# Patient Record
Sex: Male | Born: 1950
Health system: Southern US, Community
[De-identification: ages and names within clinical notes are randomized; demographics above are authoritative.]

## PROBLEM LIST (undated history)

## (undated) DIAGNOSIS — I10 Essential (primary) hypertension: Secondary | ICD-10-CM

## (undated) DIAGNOSIS — K921 Melena: Secondary | ICD-10-CM

## (undated) DIAGNOSIS — M109 Gout, unspecified: Secondary | ICD-10-CM

## (undated) DIAGNOSIS — K219 Gastro-esophageal reflux disease without esophagitis: Secondary | ICD-10-CM

## (undated) DIAGNOSIS — J449 Chronic obstructive pulmonary disease, unspecified: Secondary | ICD-10-CM

## (undated) DIAGNOSIS — I509 Heart failure, unspecified: Secondary | ICD-10-CM

## (undated) DIAGNOSIS — E119 Type 2 diabetes mellitus without complications: Secondary | ICD-10-CM

## (undated) DIAGNOSIS — R609 Edema, unspecified: Secondary | ICD-10-CM

## (undated) DIAGNOSIS — J189 Pneumonia, unspecified organism: Secondary | ICD-10-CM

## (undated) HISTORY — DX: Gout, unspecified: M10.9

## (undated) HISTORY — DX: Gastro-esophageal reflux disease without esophagitis: K21.9

## (undated) HISTORY — DX: Pneumonia, unspecified organism: J18.9

## (undated) HISTORY — DX: Edema, unspecified: R60.9

## (undated) HISTORY — DX: Melena: K92.1

---

## 2006-04-28 ENCOUNTER — Ambulatory Visit: Payer: Self-pay | Admitting: Anesthesiology

## 2007-08-10 ENCOUNTER — Ambulatory Visit: Payer: Self-pay | Admitting: Family Medicine

## 2007-10-06 ENCOUNTER — Emergency Department: Payer: Self-pay | Admitting: Internal Medicine

## 2014-10-28 ENCOUNTER — Ambulatory Visit: Payer: Self-pay | Admitting: Family Medicine

## 2016-07-27 ENCOUNTER — Emergency Department: Payer: Managed Care, Other (non HMO)

## 2016-07-27 ENCOUNTER — Encounter: Payer: Self-pay | Admitting: *Deleted

## 2016-07-27 ENCOUNTER — Inpatient Hospital Stay
Admission: EM | Admit: 2016-07-27 | Discharge: 2016-07-29 | DRG: 194 | Disposition: A | Discharge status: Home / Self Care | Payer: Managed Care, Other (non HMO) | Attending: Internal Medicine | Admitting: Internal Medicine

## 2016-07-27 DIAGNOSIS — I110 Hypertensive heart disease with heart failure: Secondary | ICD-10-CM | POA: Diagnosis present

## 2016-07-27 DIAGNOSIS — G4700 Insomnia, unspecified: Secondary | ICD-10-CM | POA: Diagnosis present

## 2016-07-27 DIAGNOSIS — R0600 Dyspnea, unspecified: Secondary | ICD-10-CM | POA: Diagnosis present

## 2016-07-27 DIAGNOSIS — J440 Chronic obstructive pulmonary disease with acute lower respiratory infection: Secondary | ICD-10-CM | POA: Diagnosis present

## 2016-07-27 DIAGNOSIS — J189 Pneumonia, unspecified organism: Secondary | ICD-10-CM

## 2016-07-27 DIAGNOSIS — E119 Type 2 diabetes mellitus without complications: Secondary | ICD-10-CM | POA: Diagnosis present

## 2016-07-27 DIAGNOSIS — E871 Hypo-osmolality and hyponatremia: Secondary | ICD-10-CM | POA: Diagnosis present

## 2016-07-27 DIAGNOSIS — Z8249 Family history of ischemic heart disease and other diseases of the circulatory system: Secondary | ICD-10-CM

## 2016-07-27 DIAGNOSIS — Z823 Family history of stroke: Secondary | ICD-10-CM

## 2016-07-27 DIAGNOSIS — Z23 Encounter for immunization: Secondary | ICD-10-CM

## 2016-07-27 DIAGNOSIS — I5032 Chronic diastolic (congestive) heart failure: Secondary | ICD-10-CM | POA: Diagnosis present

## 2016-07-27 HISTORY — DX: Type 2 diabetes mellitus without complications: E11.9

## 2016-07-27 HISTORY — DX: Essential (primary) hypertension: I10

## 2016-07-27 HISTORY — DX: Chronic obstructive pulmonary disease, unspecified: J44.9

## 2016-07-27 HISTORY — DX: Pneumonia, unspecified organism: J18.9

## 2016-07-27 HISTORY — DX: Heart failure, unspecified: I50.9

## 2016-07-27 LAB — CBC WITH DIFFERENTIAL/PLATELET
Basophils Absolute: 0.1 10*3/uL (ref 0–0.1)
Basophils Relative: 1 %
EOS ABS: 0.3 10*3/uL (ref 0–0.7)
Eosinophils Relative: 3 %
HCT: 44.9 % (ref 40.0–52.0)
HEMOGLOBIN: 15.5 g/dL (ref 13.0–18.0)
LYMPHS ABS: 1.5 10*3/uL (ref 1.0–3.6)
Lymphocytes Relative: 17 %
MCH: 31.2 pg (ref 26.0–34.0)
MCHC: 34.5 g/dL (ref 32.0–36.0)
MCV: 90.4 fL (ref 80.0–100.0)
Monocytes Absolute: 1.3 10*3/uL — ABNORMAL HIGH (ref 0.2–1.0)
Monocytes Relative: 14 %
NEUTROS ABS: 6.1 10*3/uL (ref 1.4–6.5)
Neutrophils Relative %: 65 %
Platelets: 188 10*3/uL (ref 150–440)
RBC: 4.97 MIL/uL (ref 4.40–5.90)
RDW: 14.4 % (ref 11.5–14.5)
WBC: 9.3 10*3/uL (ref 3.8–10.6)

## 2016-07-27 LAB — CBC
HCT: 46 % (ref 40.0–52.0)
Hemoglobin: 15.7 g/dL (ref 13.0–18.0)
MCH: 30.9 pg (ref 26.0–34.0)
MCHC: 34.2 g/dL (ref 32.0–36.0)
MCV: 90.4 fL (ref 80.0–100.0)
PLATELETS: 217 10*3/uL (ref 150–440)
RBC: 5.09 MIL/uL (ref 4.40–5.90)
RDW: 14.3 % (ref 11.5–14.5)
WBC: 10.8 10*3/uL — ABNORMAL HIGH (ref 3.8–10.6)

## 2016-07-27 LAB — BASIC METABOLIC PANEL
Anion gap: 12 (ref 5–15)
BUN: 14 mg/dL (ref 6–20)
CHLORIDE: 94 mmol/L — AB (ref 101–111)
CO2: 27 mmol/L (ref 22–32)
Calcium: 9.1 mg/dL (ref 8.9–10.3)
Creatinine, Ser: 1.25 mg/dL — ABNORMAL HIGH (ref 0.61–1.24)
GFR calc non Af Amer: 60 mL/min — ABNORMAL LOW (ref >60)
Glucose, Bld: 395 mg/dL — ABNORMAL HIGH (ref 65–99)
POTASSIUM: 3.7 mmol/L (ref 3.5–5.1)
SODIUM: 133 mmol/L — AB (ref 135–145)

## 2016-07-27 LAB — GLUCOSE, CAPILLARY
GLUCOSE-CAPILLARY: 364 mg/dL — AB (ref 65–99)
Glucose-Capillary: 352 mg/dL — ABNORMAL HIGH (ref 65–99)

## 2016-07-27 LAB — CREATININE, SERUM
Creatinine, Ser: 1.28 mg/dL — ABNORMAL HIGH (ref 0.61–1.24)
GFR calc Af Amer: >60 mL/min (ref >60)
GFR calc non Af Amer: 58 mL/min — ABNORMAL LOW (ref >60)

## 2016-07-27 LAB — TROPONIN I

## 2016-07-27 LAB — MAGNESIUM: MAGNESIUM: 1.7 mg/dL (ref 1.7–2.4)

## 2016-07-27 LAB — STREP PNEUMONIAE URINARY ANTIGEN: Strep Pneumo Urinary Antigen: NEGATIVE

## 2016-07-27 LAB — BRAIN NATRIURETIC PEPTIDE: B NATRIURETIC PEPTIDE 5: 37 pg/mL (ref 0.0–100.0)

## 2016-07-27 MED ORDER — METHYLPREDNISOLONE SODIUM SUCC 125 MG IJ SOLR
125.0000 mg | Freq: Once | INTRAMUSCULAR | Status: AC
  Administered 2016-07-27: 125 mg via INTRAVENOUS
  Filled 2016-07-27: qty 2

## 2016-07-27 MED ORDER — ADULT MULTIVITAMIN W/MINERALS CH
1.0000 | ORAL_TABLET | Freq: Every day | ORAL | Status: DC
  Administered 2016-07-28 – 2016-07-29 (×2): 1 via ORAL
  Filled 2016-07-27 (×2): qty 1

## 2016-07-27 MED ORDER — AZITHROMYCIN 500 MG PO TABS: 500.0000 mg | ORAL_TABLET | Freq: Once | ORAL | Status: DC

## 2016-07-27 MED ORDER — LOSARTAN POTASSIUM 50 MG PO TABS
100.0000 mg | ORAL_TABLET | Freq: Every day | ORAL | Status: DC
  Administered 2016-07-28 – 2016-07-29 (×2): 100 mg via ORAL
  Filled 2016-07-27 (×2): qty 2

## 2016-07-27 MED ORDER — PREDNISONE 20 MG PO TABS: 60.0000 mg | ORAL_TABLET | Freq: Once | ORAL | Status: DC

## 2016-07-27 MED ORDER — VITAMIN D 1000 UNITS PO TABS
1000.0000 [IU] | ORAL_TABLET | Freq: Every day | ORAL | Status: DC
  Administered 2016-07-27 – 2016-07-29 (×3): 1000 [IU] via ORAL
  Filled 2016-07-27 (×5): qty 1

## 2016-07-27 MED ORDER — INSULIN ASPART 100 UNIT/ML ~~LOC~~ SOLN
0.0000 [IU] | Freq: Three times a day (TID) | SUBCUTANEOUS | Status: DC
  Administered 2016-07-27: 16:00:00 15 [IU] via SUBCUTANEOUS
  Administered 2016-07-28: 11:00:00 8 [IU] via SUBCUTANEOUS
  Administered 2016-07-28: 11 [IU] via SUBCUTANEOUS
  Filled 2016-07-27: qty 8
  Filled 2016-07-27: qty 11
  Filled 2016-07-27: qty 15

## 2016-07-27 MED ORDER — METOPROLOL SUCCINATE ER 50 MG PO TB24
25.0000 mg | ORAL_TABLET | Freq: Every day | ORAL | Status: DC
  Administered 2016-07-28 – 2016-07-29 (×2): 25 mg via ORAL
  Filled 2016-07-27 (×2): qty 1

## 2016-07-27 MED ORDER — TORSEMIDE 20 MG PO TABS
20.0000 mg | ORAL_TABLET | Freq: Every day | ORAL | Status: DC
  Administered 2016-07-28 – 2016-07-29 (×2): 20 mg via ORAL
  Filled 2016-07-27 (×2): qty 1

## 2016-07-27 MED ORDER — ACETAMINOPHEN 325 MG PO TABS
650.0000 mg | ORAL_TABLET | Freq: Four times a day (QID) | ORAL | Status: DC | PRN
  Administered 2016-07-27 – 2016-07-28 (×3): 650 mg via ORAL
  Filled 2016-07-27 (×3): qty 2

## 2016-07-27 MED ORDER — METFORMIN HCL 500 MG PO TABS
1000.0000 mg | ORAL_TABLET | Freq: Two times a day (BID) | ORAL | Status: DC
  Administered 2016-07-27 – 2016-07-29 (×4): 1000 mg via ORAL
  Filled 2016-07-27 (×4): qty 2

## 2016-07-27 MED ORDER — ALBUTEROL SULFATE (2.5 MG/3ML) 0.083% IN NEBU
2.5000 mg | INHALATION_SOLUTION | Freq: Four times a day (QID) | RESPIRATORY_TRACT | Status: DC | PRN
  Administered 2016-07-27 – 2016-07-29 (×3): 2.5 mg via RESPIRATORY_TRACT
  Filled 2016-07-27 (×3): qty 3

## 2016-07-27 MED ORDER — ZOLPIDEM TARTRATE 5 MG PO TABS
5.0000 mg | ORAL_TABLET | Freq: Every evening | ORAL | Status: DC | PRN
  Administered 2016-07-28: 23:00:00 5 mg via ORAL
  Filled 2016-07-27: qty 1

## 2016-07-27 MED ORDER — SODIUM CHLORIDE 0.9 % IV SOLN
INTRAVENOUS | Status: DC
  Administered 2016-07-27 – 2016-07-28 (×2): via INTRAVENOUS

## 2016-07-27 MED ORDER — ENOXAPARIN SODIUM 40 MG/0.4ML ~~LOC~~ SOLN
40.0000 mg | Freq: Two times a day (BID) | SUBCUTANEOUS | Status: DC
  Filled 2016-07-27 (×3): qty 0.4

## 2016-07-27 MED ORDER — BENZONATATE 100 MG PO CAPS
100.0000 mg | ORAL_CAPSULE | Freq: Three times a day (TID) | ORAL | Status: DC | PRN
  Administered 2016-07-27: 23:00:00 100 mg via ORAL
  Administered 2016-07-28: 200 mg via ORAL
  Administered 2016-07-28: 100 mg via ORAL
  Filled 2016-07-27 (×2): qty 1
  Filled 2016-07-27: qty 2

## 2016-07-27 MED ORDER — ALBUTEROL SULFATE (2.5 MG/3ML) 0.083% IN NEBU
2.5000 mg | INHALATION_SOLUTION | Freq: Once | RESPIRATORY_TRACT | Status: AC
  Administered 2016-07-27: 2.5 mg via RESPIRATORY_TRACT
  Filled 2016-07-27: qty 3

## 2016-07-27 MED ORDER — SODIUM CHLORIDE 0.9 % IV SOLN: 250.0000 mL | INTRAVENOUS | Status: DC | PRN

## 2016-07-27 MED ORDER — MOMETASONE FURO-FORMOTEROL FUM 200-5 MCG/ACT IN AERO
2.0000 | INHALATION_SPRAY | Freq: Two times a day (BID) | RESPIRATORY_TRACT | Status: DC
  Administered 2016-07-27 – 2016-07-29 (×4): 2 via RESPIRATORY_TRACT
  Filled 2016-07-27: qty 8.8

## 2016-07-27 MED ORDER — DEXTROSE 5 % IV SOLN: 1.0000 g | Freq: Once | INTRAVENOUS | Status: DC

## 2016-07-27 MED ORDER — DEXTROSE 5 % IV SOLN
500.0000 mg | Freq: Once | INTRAVENOUS | Status: AC
  Administered 2016-07-27: 500 mg via INTRAVENOUS
  Filled 2016-07-27: qty 500

## 2016-07-27 MED ORDER — FLUTICASONE PROPIONATE 50 MCG/ACT NA SUSP
2.0000 | Freq: Every day | NASAL | Status: DC
  Administered 2016-07-27 – 2016-07-29 (×3): 2 via NASAL
  Filled 2016-07-27: qty 16

## 2016-07-27 MED ORDER — SODIUM CHLORIDE 0.9% FLUSH
3.0000 mL | Freq: Two times a day (BID) | INTRAVENOUS | Status: DC
  Administered 2016-07-27 (×2): 3 mL via INTRAVENOUS

## 2016-07-27 MED ORDER — CEFTRIAXONE SODIUM-DEXTROSE 1-3.74 GM-% IV SOLR
1.0000 g | Freq: Once | INTRAVENOUS | Status: AC
  Administered 2016-07-27: 1 g via INTRAVENOUS
  Filled 2016-07-27: qty 50

## 2016-07-27 MED ORDER — LEVOFLOXACIN IN D5W 750 MG/150ML IV SOLN
750.0000 mg | INTRAVENOUS | Status: DC
Start: 2016-07-28 — End: 2016-07-29
  Administered 2016-07-28 – 2016-07-29 (×2): 750 mg via INTRAVENOUS
  Filled 2016-07-27 (×2): qty 150

## 2016-07-27 MED ORDER — DILTIAZEM HCL 30 MG PO TABS
30.0000 mg | ORAL_TABLET | Freq: Four times a day (QID) | ORAL | Status: DC
  Administered 2016-07-27 – 2016-07-29 (×7): 30 mg via ORAL
  Filled 2016-07-27 (×7): qty 1

## 2016-07-27 MED ORDER — SODIUM CHLORIDE 0.9% FLUSH: 3.0000 mL | INTRAVENOUS | Status: DC | PRN

## 2016-07-27 MED ORDER — INSULIN ASPART 100 UNIT/ML ~~LOC~~ SOLN
3.0000 [IU] | Freq: Three times a day (TID) | SUBCUTANEOUS | Status: DC
Start: 2016-07-27 — End: 2016-07-29
  Administered 2016-07-27 – 2016-07-29 (×6): 3 [IU] via SUBCUTANEOUS
  Filled 2016-07-27 (×6): qty 3

## 2016-07-27 MED ORDER — INFLUENZA VAC SPLIT QUAD 0.5 ML IM SUSY
0.5000 mL | PREFILLED_SYRINGE | INTRAMUSCULAR | Status: AC
  Administered 2016-07-28: 08:00:00 0.5 mL via INTRAMUSCULAR
  Filled 2016-07-27: qty 0.5

## 2016-07-27 MED ORDER — INSULIN GLARGINE 100 UNIT/ML ~~LOC~~ SOLN
10.0000 [IU] | Freq: Every day | SUBCUTANEOUS | Status: DC
  Administered 2016-07-27 – 2016-07-28 (×2): 10 [IU] via SUBCUTANEOUS
  Filled 2016-07-27 (×2): qty 0.1

## 2016-07-27 NOTE — ED Notes (Signed)
Pt taken to xray via stretcher  

## 2016-07-27 NOTE — ED Notes (Signed)
Called 1C back to make sure it was ok for pt to come up now. Estill Bamberg stated he could come up now.

## 2016-07-27 NOTE — ED Notes (Signed)
Pt given 3rd cup of water. Ambulatory around room.

## 2016-07-27 NOTE — Progress Notes (Signed)
Anticoagulation monitoring(Lovenox):  66 yo  male ordered Lovenox 40 mg Q24h  Filed Weights   07/27/16 0918  Weight: 285 lb (129.3 kg)   BMI 42.2   Lab Results  Component Value Date   CREATININE 1.25 (H) 07/27/2016   Estimated Creatinine Clearance: 80.5 mL/min (by C-G formula based on SCr of 1.25 mg/dL (H)). Hemoglobin & Hematocrit     Component Value Date/Time   HGB 15.5 07/27/2016 0931   HCT 44.9 07/27/2016 0931     Per Protocol for Patient with estCrcl > 30 ml/min and BMI > 40, will transition to Lovenox 40 mg Q12h.

## 2016-07-27 NOTE — ED Notes (Signed)
Called lab making sure they had blood tubes. They stated they did.

## 2016-07-27 NOTE — H&P (Addendum)
[facility] [place] NAME: Jason Hudson    MR#:  [id number]  DATE OF BIRTH:  [birth year]  DATE OF ADMISSION:  07/27/2016  PRIMARY CARE PHYSICIAN: [name], MD   REQUESTING/REFERRING PHYSICIAN: Orbie Pyo, MD  CHIEF COMPLAINT:   Chief Complaint  Patient presents with  . Cough  . Shortness of Breath    HISTORY OF PRESENT ILLNESS:  Jason Hudson  is a 65 y.o. male with a known history of CHF as well as COPD and diabetes being admitted for pneumonia (failed outpt treatment) with one half weeks of productive cough as well as chills. He has been on Augmentin as well as Keflex as an outpatient. He started the Augmentin on November 22 and since finished it. He has been on Keflex since yesterday but with worsening symptoms this morning. He describes chest pain with coughing as well as shortness of breath. He says he is having difficulty walking secondary to his shortness of breath. PAST MEDICAL HISTORY:   Past Medical History:  Diagnosis Date  . CHF (congestive heart failure) ([place])   . COPD (chronic obstructive pulmonary disease) ([place])   . Diabetes mellitus without complication ([place])   . Hypertension     PAST SURGICAL HISTORY:  History reviewed. No pertinent surgical history.  SOCIAL HISTORY:   Social History  Substance Use Topics  . Smoking status: Never Smoker  . Smokeless tobacco: Never Used  . Alcohol use No    FAMILY HISTORY:  History reviewed. No pertinent family history. Dad: MI & STROKE MOM: CANCER DRUG ALLERGIES:   Allergies  Allergen Reactions  . Red Dye Anaphylaxis    "not as allergic to it as I used to be. I can take a little of it"    REVIEW OF SYSTEMS:   Review of Systems  Constitutional: Positive for chills and malaise/fatigue. Negative for fever and weight loss.  HENT: Negative for nosebleeds and sore throat.   Eyes: Negative for blurred vision.  Respiratory: Positive for cough, sputum  production and shortness of breath. Negative for wheezing.   Cardiovascular: Negative for chest pain, orthopnea, leg swelling and PND.  Gastrointestinal: Negative for abdominal pain, constipation, diarrhea, heartburn, nausea and vomiting.  Genitourinary: Negative for dysuria and urgency.  Musculoskeletal: Negative for back pain.  Skin: Negative for rash.  Neurological: Positive for weakness. Negative for dizziness, speech change, focal weakness and headaches.  Endo/Heme/Allergies: Does not bruise/bleed easily.  Psychiatric/Behavioral: Negative for depression.    MEDICATIONS AT HOME:   Prior to Admission medications   Not on File      VITAL SIGNS:  Blood pressure (!) 185/86, pulse (!) 107, temperature 97.5 F (36.4 C), temperature source Oral, resp. rate 20, height 5\' 9"  (1.753 m), weight 129.3 kg (285 lb), SpO2 94 %.  PHYSICAL EXAMINATION:  Physical Exam  Constitutional: He is oriented to person, place, and time and well-developed, well-nourished, and in no distress.  HENT:  Head: Normocephalic and atraumatic.  Eyes: Conjunctivae and EOM are normal. Pupils are equal, round, and reactive to light.  Neck: Normal range of motion. Neck supple. No tracheal deviation present. No thyromegaly present.  Cardiovascular: Normal rate, regular rhythm and normal heart sounds.   Pulmonary/Chest: Effort normal and breath sounds normal. No respiratory distress. He has no wheezes. He exhibits no tenderness.  Abdominal: Soft. Bowel sounds are normal. He exhibits no distension. There is no tenderness.  Musculoskeletal: Normal range of motion.  Neurological: He is alert and oriented  to person, place, and time. No cranial nerve deficit.  Skin: Skin is warm and dry. No rash noted.  Psychiatric: Mood and affect normal.   LABORATORY PANEL:   CBC  Recent Labs Lab 07/27/16 1544  WBC 10.8*  HGB 15.7  HCT 46.0  PLT 217    ------------------------------------------------------------------------------------------------------------------  Chemistries   Recent Labs Lab 07/27/16 0931 07/27/16 1544  NA 133*  --   K 3.7  --   CL 94*  --   CO2 27  --   GLUCOSE 395*  --   BUN 14  --   CREATININE 1.25* 1.28*  CALCIUM 9.1  --   MG 1.7  --    ------------------------------------------------------------------------------------------------------------------  Cardiac Enzymes  Recent Labs Lab 07/27/16 0931  TROPONINI <0.03   ------------------------------------------------------------------------------------------------------------------  RADIOLOGY:  Dg Chest 2 View  Result Date: 07/27/2016 CLINICAL DATA:  Productive cough, congestion EXAM: CHEST  2 VIEW COMPARISON:  04/28/2006 FINDINGS: Cardiomegaly is noted. There is elevation of the right hemidiaphragm. There is streaky infiltrate/pneumonia in right base. Follow-up to resolution is recommended. Bony thorax is unremarkable. No pulmonary edema. IMPRESSION: Streaky infiltrate/ pneumonia right base. Follow-up to resolution is recommended. No pulmonary edema. Electronically Signed   By: Lahoma Crocker M.D.   On: 07/27/2016 11:41      IMPRESSION AND PLAN:  44 y m with PNA  * Pna - levaquin - repeat cxr  * Uncontrolled HTN - continue metoprolol, losartan, torsemide and add cardizem   * DM - Continue metformin, novolog, add lantus and SSI  * Hyponatremia IVF and monitor  * Insomnia - Ambien prn    All the records are reviewed and case discussed with ED provider. Management plans discussed with the patient, family and they are in agreement.  CODE STATUS: full code  TOTAL TIME TAKING CARE OF THIS PATIENT: 45 minutes.    Max Sane M.D on 07/27/2016 at 9:51 PM  Between 7am to 6pm - Pager - [phone]  After 6pm go to [email or web address] - Proofreader  [facility]  Office  [phone]  CC: Primary care  physician; [name], MD   Note: This dictation was prepared with Dragon dictation along with smaller phrase technology. Any transcriptional errors that result from this process are unintentional.

## 2016-07-27 NOTE — ED Provider Notes (Signed)
[facility] Emergency Department Provider Note  ____________________________________________   First MD Initiated Contact with Patient 07/27/16 1030     (approximate)  I have reviewed the triage vital signs and the nursing notes.   HISTORY  Chief Complaint Cough and Shortness of Breath   HPI Jason Hudson is a 65 y.o. male with a history of CHF as well as COPD and diabetes who is presenting to the emergency department with one half weeks of productive cough as well as chills. He has been on Augmentin as well as Keflex as an outpatient. He started the Augmentin on November 22 and since finished it. He has been on Keflex since yesterday but with worsening symptoms this morning. He describes chest pain with coughing as well as shortness of breath. He says he is having difficulty walking secondary to his shortness of breath.   Past Medical History:  Diagnosis Date  . CHF (congestive heart failure) ([place])   . COPD (chronic obstructive pulmonary disease) ([place])   . Diabetes mellitus without complication ([place])   . Hypertension     There are no active problems to display for this patient.   No past surgical history on file.  Prior to Admission medications   Not on File    Allergies Red dye  No family history on file.  Social History Social History  Substance Use Topics  . Smoking status: Never Smoker  . Smokeless tobacco: Not on file  . Alcohol use No    Review of Systems Constitutional: No fever/chills Eyes: No visual changes. ENT: No sore throat. Cardiovascular: As above Respiratory:As above Gastrointestinal: No abdominal pain.  No nausea, no vomiting.  No diarrhea.  No constipation. Genitourinary: Negative for dysuria. Musculoskeletal: Negative for back pain. Skin: Negative for rash. Neurological: Negative for headaches, focal weakness or numbness.  10-point ROS otherwise  negative.  ____________________________________________   PHYSICAL EXAM:  VITAL SIGNS: ED Triage Vitals  Enc Vitals Group     BP 07/27/16 0918 (!) 206/98     Pulse Rate 07/27/16 0918 (!) 101     Resp 07/27/16 0918 18     Temp 07/27/16 0918 98.2 F (36.8 C)     Temp Source 07/27/16 0918 Oral     SpO2 07/27/16 0918 95 %     Weight 07/27/16 0918 285 lb (129.3 kg)     Height 07/27/16 0918 5\' 9"  (1.753 m)     Head Circumference --      Peak Flow --      Pain Score 07/27/16 0927 6     Pain Loc --      Pain Edu? --      Excl. in [place]? --     Constitutional: Alert and oriented. Well appearing and in no acute distress. Eyes: Conjunctivae are normal. PERRL. EOMI. Head: Atraumatic. Nose: No congestion/rhinnorhea. Mouth/Throat: Mucous membranes are moist.   Neck: No stridor.   Cardiovascular: tachycardic, regular rhythm. Grossly normal heart sounds.  Good peripheral circulation. Respiratory: Normal respiratory effort.  No retractions. Lungs CTAB. Gastrointestinal: Soft and nontender. No distention. No abdominal bruits. No CVA tenderness. Musculoskeletal: Moderate bilateral lower extremity edema which the patient says is slightly increased from his baseline..  No joint effusions. Neurologic:  Normal speech and language. No gross focal neurologic deficits are appreciated. No gait instability. Skin:  Skin is warm, dry and intact. No rash noted. Psychiatric: Mood and affect are normal. Speech and behavior are normal.  ____________________________________________   LABS (all labs  ordered are listed, but only abnormal results are displayed)  Labs Reviewed  CBC WITH DIFFERENTIAL/PLATELET - Abnormal; Notable for the following:       Result Value   Monocytes Absolute 1.3 (*)    All other components within normal limits  BASIC METABOLIC PANEL - Abnormal; Notable for the following:    Sodium 133 (*)    Chloride 94 (*)    Glucose, Bld 395 (*)    Creatinine, Ser 1.25 (*)    GFR calc non  Af Amer 60 (*)    All other components within normal limits  TROPONIN I  MAGNESIUM  BRAIN NATRIURETIC PEPTIDE   ____________________________________________  EKG  ED ECG REPORT I, Doran Stabler, the attending physician, personally viewed and interpreted this ECG.   Date: 07/27/2016  EKG Time: 0929  Rate: 97  Rhythm: normal sinus rhythm with multiple PVCs  Axis: Normal axis  Intervals:none  ST&T Change: No ST segment elevation or depression. No abnormal T-wave inversion.  ____________________________________________  [id number] DG Chest 2 View (Final result)  Result time 07/27/16 11:41:49  Final result by Lahoma Crocker, MD (07/27/16 11:41:49)           Narrative:   CLINICAL DATA: Productive cough, congestion  EXAM: CHEST 2 VIEW  COMPARISON: 04/28/2006  FINDINGS: Cardiomegaly is noted. There is elevation of the right hemidiaphragm. There is streaky infiltrate/pneumonia in right base. Follow-up to resolution is recommended. Bony thorax is unremarkable. No pulmonary edema.  IMPRESSION: Streaky infiltrate/ pneumonia right base. Follow-up to resolution is recommended. No pulmonary edema.   Electronically Signed By: Lahoma Crocker M.D. On: 07/27/2016 11:41           ____________________________________________   PROCEDURES  Procedure(s) performed:   Procedures  Critical Care performed:   ____________________________________________   INITIAL IMPRESSION / ASSESSMENT AND PLAN / ED COURSE  Pertinent labs & imaging results that were available during my care of the patient were reviewed by me and considered in my medical decision making (see chart for details).    Clinical Course   ----------------------------------------- 1:01 PM on 07/27/2016 -----------------------------------------  Patient found to have right-sided pneumonia. Discussed treatment plan of outpatient antibiotics as well as steroids versus inpatient. The patient says that  he is concerned that he managed back in the emergency department. He has not been admitted to the hospital of the past 3 months. We'll admit for acquired pneumonia. Signed out to Dr. Tressia Miners.   ____________________________________________   FINAL CLINICAL IMPRESSION(S) / ED DIAGNOSES  Community acquired pneumonia.    NEW MEDICATIONS STARTED DURING THIS VISIT:  New Prescriptions   No medications on file     Note:  This document was prepared using Dragon voice recognition software and may include unintentional dictation errors.    Orbie Pyo, MD 07/27/16 [phone]

## 2016-07-27 NOTE — ED Triage Notes (Signed)
Pt complains of cough, congestion chest pain for the last 3 days, pt was seen in the clinic prescribed Keflex , pt reports symptoms are worse

## 2016-07-28 LAB — EXPECTORATED SPUTUM ASSESSMENT W REFEX TO RESP CULTURE

## 2016-07-28 LAB — COMPREHENSIVE METABOLIC PANEL
ALK PHOS: 68 U/L (ref 38–126)
ALT: 33 U/L (ref 17–63)
AST: 43 U/L — ABNORMAL HIGH (ref 15–41)
Albumin: 3.4 g/dL — ABNORMAL LOW (ref 3.5–5.0)
Anion gap: 12 (ref 5–15)
BILIRUBIN TOTAL: 0.7 mg/dL (ref 0.3–1.2)
BUN: 22 mg/dL — ABNORMAL HIGH (ref 6–20)
CALCIUM: 9 mg/dL (ref 8.9–10.3)
CO2: 25 mmol/L (ref 22–32)
CREATININE: 1.24 mg/dL (ref 0.61–1.24)
Chloride: 98 mmol/L — ABNORMAL LOW (ref 101–111)
Glucose, Bld: 377 mg/dL — ABNORMAL HIGH (ref 65–99)
Potassium: 3.6 mmol/L (ref 3.5–5.1)
Sodium: 135 mmol/L (ref 135–145)
TOTAL PROTEIN: 7.3 g/dL (ref 6.5–8.1)

## 2016-07-28 LAB — CBC WITH DIFFERENTIAL/PLATELET
BASOS PCT: 0 %
Basophils Absolute: 0 10*3/uL (ref 0–0.1)
EOS ABS: 0 10*3/uL (ref 0–0.7)
Eosinophils Relative: 0 %
HCT: 42.8 % (ref 40.0–52.0)
HEMOGLOBIN: 14.5 g/dL (ref 13.0–18.0)
Lymphocytes Relative: 13 %
Lymphs Abs: 1.6 10*3/uL (ref 1.0–3.6)
MCH: 31.1 pg (ref 26.0–34.0)
MCHC: 33.8 g/dL (ref 32.0–36.0)
MCV: 91.8 fL (ref 80.0–100.0)
Monocytes Absolute: 1 10*3/uL (ref 0.2–1.0)
Monocytes Relative: 8 %
NEUTROS PCT: 79 %
Neutro Abs: 9.8 10*3/uL — ABNORMAL HIGH (ref 1.4–6.5)
PLATELETS: 207 10*3/uL (ref 150–440)
RBC: 4.67 MIL/uL (ref 4.40–5.90)
RDW: 14.2 % (ref 11.5–14.5)
WBC: 12.4 10*3/uL — AB (ref 3.8–10.6)

## 2016-07-28 LAB — HIV ANTIBODY (ROUTINE TESTING W REFLEX): HIV SCREEN 4TH GENERATION: NONREACTIVE

## 2016-07-28 LAB — GLUCOSE, CAPILLARY
GLUCOSE-CAPILLARY: 342 mg/dL — AB (ref 65–99)
GLUCOSE-CAPILLARY: 187 mg/dL — AB (ref 65–99)
Glucose-Capillary: 270 mg/dL — ABNORMAL HIGH (ref 65–99)
Glucose-Capillary: 233 mg/dL — ABNORMAL HIGH (ref 65–99)

## 2016-07-28 LAB — EXPECTORATED SPUTUM ASSESSMENT W GRAM STAIN, RFLX TO RESP C

## 2016-07-28 LAB — HEMOGLOBIN A1C
Hgb A1c MFr Bld: 11.3 % — ABNORMAL HIGH (ref 4.8–5.6)
MEAN PLASMA GLUCOSE: 278 mg/dL

## 2016-07-28 MED ORDER — ACETAMINOPHEN 325 MG PO TABS
650.0000 mg | ORAL_TABLET | Freq: Four times a day (QID) | ORAL | Status: DC | PRN
  Administered 2016-07-28: 20:00:00 650 mg via ORAL
  Filled 2016-07-28: qty 2

## 2016-07-28 MED ORDER — INSULIN ASPART 100 UNIT/ML ~~LOC~~ SOLN
0.0000 [IU] | Freq: Three times a day (TID) | SUBCUTANEOUS | Status: DC
  Administered 2016-07-28: 7 [IU] via SUBCUTANEOUS
  Administered 2016-07-29 (×2): 11 [IU] via SUBCUTANEOUS
  Filled 2016-07-28: qty 7
  Filled 2016-07-28 (×2): qty 11

## 2016-07-28 MED ORDER — GUAIFENESIN ER 600 MG PO TB12
600.0000 mg | ORAL_TABLET | Freq: Two times a day (BID) | ORAL | Status: DC
  Administered 2016-07-28 – 2016-07-29 (×3): 600 mg via ORAL
  Filled 2016-07-28 (×3): qty 1

## 2016-07-28 MED ORDER — IBUPROFEN 400 MG PO TABS
400.0000 mg | ORAL_TABLET | Freq: Four times a day (QID) | ORAL | Status: DC | PRN
  Administered 2016-07-28: 11:00:00 400 mg via ORAL
  Filled 2016-07-28: qty 1

## 2016-07-28 MED ORDER — INSULIN GLARGINE 100 UNIT/ML ~~LOC~~ SOLN
15.0000 [IU] | Freq: Every day | SUBCUTANEOUS | Status: DC
  Administered 2016-07-29: 10:00:00 15 [IU] via SUBCUTANEOUS
  Filled 2016-07-28: qty 0.15

## 2016-07-28 MED ORDER — INSULIN ASPART 100 UNIT/ML ~~LOC~~ SOLN: 0.0000 [IU] | Freq: Every day | SUBCUTANEOUS | Status: DC

## 2016-07-28 NOTE — Progress Notes (Signed)
[place] at [facility]                                                                                                                                                                                  Patient Demographics   Jason Hudson, is a 65 y.o. male, DOB - 1950/12/06, PIR:[id number]  Admit date - 07/27/2016   Admitting Physician Hemang Candyce Churn, MD  Outpatient Primary MD for the patient is VINES,DAIN, MD   LOS - 1  Subjective: Patient complains of shortness of breath, has a cough Also complains of pain in bilateral ears    Review of Systems:   CONSTITUTIONAL: No documented fever. No fatigue, weakness. No weight gain, no weight loss.  EYES: No blurry or double vision.  ENT: No tinnitus. No postnasal drip. No redness of the oropharynx. Bilateral ear pain RESPIRATORY: Positive cough, no wheeze, no hemoptysis. Positive dyspnea.  CARDIOVASCULAR: No chest pain. No orthopnea. No palpitations. No syncope.  GASTROINTESTINAL: No nausea, no vomiting or diarrhea. No abdominal pain. No melena or hematochezia.  GENITOURINARY: No dysuria or hematuria.  ENDOCRINE: No polyuria or nocturia. No heat or cold intolerance.  HEMATOLOGY: No anemia. No bruising. No bleeding.  INTEGUMENTARY: No rashes. No lesions.  MUSCULOSKELETAL: No arthritis. No swelling. No gout.  NEUROLOGIC: No numbness, tingling, or ataxia. No seizure-type activity.  PSYCHIATRIC: No anxiety. No insomnia. No ADD.    Vitals:   Vitals:   07/27/16 2018 07/27/16 2307 07/28/16 0432 07/28/16 0743  BP: (!) 185/86 (!) 163/94 (!) 156/82   Pulse: (!) 107 (!) 104 96   Resp:   20   Temp:   97.7 F (36.5 C)   TempSrc:   Oral   SpO2:   97% 99%  Weight:      Height:        Wt Readings from Last 3 Encounters:  07/27/16 285 lb (129.3 kg)     Intake/Output Summary (Last 24 hours) at 07/28/16 1258 Last data filed at 07/28/16 0500  Gross per 24 hour  Intake           703.75 ml  Output                 0 ml  Net           703.75 ml    Physical Exam:   GENERAL: Pleasant-appearing in no apparent distress.  HEAD, EYES, EARS, NOSE AND THROAT: Atraumatic, normocephalic. Extraocular muscles are intact. Pupils equal and reactive to light. Sclerae anicteric. No conjunctival injection. No oro-pharyngeal erythema. ear exam shows no erythema NECK: Supple. There is no jugular venous distention. No bruits, no lymphadenopathy, no thyromegaly.  HEART: Regular rate and rhythm,. No murmurs, no rubs, no clicks.  LUNGS: Clear to auscultation bilaterally. No rales or rhonchi. No wheezes.  ABDOMEN: Soft, flat, nontender, nondistended. Has good bowel sounds. No hepatosplenomegaly appreciated.  EXTREMITIES: No evidence of any cyanosis, clubbing, or peripheral edema.  +2 pedal and radial pulses bilaterally.  NEUROLOGIC: The patient is alert, awake, and oriented x3 with no focal motor or sensory deficits appreciated bilaterally.  SKIN: Moist and warm with no rashes appreciated.  Psych: Not anxious, depressed LN: No inguinal LN enlargement    Antibiotics   Anti-infectives    Start     Dose/Rate Route Frequency Ordered Stop   07/28/16 1000  levofloxacin (LEVAQUIN) IVPB 750 mg     750 mg 100 mL/hr over 90 Minutes Intravenous Every 24 hours 07/27/16 1515 08/02/16 0959   07/27/16 1300  cefTRIAXone (ROCEPHIN) 1 g in dextrose 5 % 50 mL IVPB  Status:  Discontinued     1 g 100 mL/hr over 30 Minutes Intravenous  Once 07/27/16 1252 07/27/16 1253   07/27/16 1300  azithromycin (ZITHROMAX) 500 mg in dextrose 5 % 250 mL IVPB     500 mg 250 mL/hr over 60 Minutes Intravenous  Once 07/27/16 1252 07/27/16 1514   07/27/16 1300  cefTRIAXone (ROCEPHIN) IVPB 1 g     1 g 100 mL/hr over 30 Minutes Intravenous  Once 07/27/16 1253 07/27/16 1354   07/27/16 1230  azithromycin (ZITHROMAX) tablet 500 mg  Status:  Discontinued     500 mg Oral  Once 07/27/16 1226 07/27/16 1251      Medications   Scheduled Meds: . cholecalciferol   1,000 Units Oral Daily  . diltiazem  30 mg Oral Q6H  . enoxaparin (LOVENOX) injection  40 mg Subcutaneous Q12H  . fluticasone  2 spray Each Nare Daily  . guaiFENesin  600 mg Oral BID  . insulin aspart  0-15 Units Subcutaneous TID WC  . insulin aspart  3 Units Subcutaneous TID WC  . [START ON 07/29/2016] insulin glargine  15 Units Subcutaneous Daily  . levofloxacin (LEVAQUIN) IV  750 mg Intravenous Q24H  . losartan  100 mg Oral Daily  . metFORMIN  1,000 mg Oral BID WC  . metoprolol succinate  25 mg Oral Daily  . mometasone-formoterol  2 puff Inhalation BID  . multivitamin with minerals  1 tablet Oral Daily  . torsemide  20 mg Oral Daily   Continuous Infusions: PRN Meds:.acetaminophen, albuterol, benzonatate, ibuprofen, zolpidem   Data Review:   Micro Results Recent Results (from the past 240 hour(s))  Culture, blood (routine x 2) Call MD if unable to obtain prior to antibiotics being given     Status: None (Preliminary result)   Collection Time: 07/27/16  3:44 PM  Result Value Ref Range Status   Specimen Description BLOOD RT ARM  Final   Special Requests BOTTLES DRAWN AEROBIC AND ANAEROBIC 5CC AER, 3CC  Final   Culture NO GROWTH < 24 HOURS  Final   Report Status PENDING  Incomplete  Culture, blood (routine x 2) Call MD if unable to obtain prior to antibiotics being given     Status: None (Preliminary result)   Collection Time: 07/27/16  3:44 PM  Result Value Ref Range Status   Specimen Description BLOOD LT HAND  Final   Special Requests   Final    BOTTLES DRAWN AEROBIC AND ANAEROBIC [place] AER, 2CC ANA   Culture NO GROWTH < 24 HOURS  Final   Report Status  PENDING  Incomplete  Culture, sputum-assessment     Status: None   Collection Time: 07/27/16  4:48 PM  Result Value Ref Range Status   Specimen Description SPUTUM  Final   Special Requests NONE  Final   Sputum evaluation   Final    Sputum specimen not acceptable for testing.  Please recollect.     Report Status 07/28/2016  FINAL  Final    Radiology Reports Dg Chest 2 View  Result Date: 07/27/2016 CLINICAL DATA:  Productive cough, congestion EXAM: CHEST  2 VIEW COMPARISON:  04/28/2006 FINDINGS: Cardiomegaly is noted. There is elevation of the right hemidiaphragm. There is streaky infiltrate/pneumonia in right base. Follow-up to resolution is recommended. Bony thorax is unremarkable. No pulmonary edema. IMPRESSION: Streaky infiltrate/ pneumonia right base. Follow-up to resolution is recommended. No pulmonary edema. Electronically Signed   By: Lahoma Crocker M.D.   On: 07/27/2016 11:41     CBC  Recent Labs Lab 07/27/16 0931 07/27/16 1544 07/28/16 0451  WBC 9.3 10.8* 12.4*  HGB 15.5 15.7 14.5  HCT 44.9 46.0 42.8  PLT 188 217 207  MCV 90.4 90.4 91.8  MCH 31.2 30.9 31.1  MCHC 34.5 34.2 33.8  RDW 14.4 14.3 14.2  LYMPHSABS 1.5  --  1.6  MONOABS 1.3*  --  1.0  EOSABS 0.3  --  0.0  BASOSABS 0.1  --  0.0    Chemistries   Recent Labs Lab 07/27/16 0931 07/27/16 1544 07/28/16 0451  NA 133*  --  135  K 3.7  --  3.6  CL 94*  --  98*  CO2 27  --  25  GLUCOSE 395*  --  377*  BUN 14  --  22*  CREATININE 1.25* 1.28* 1.24  CALCIUM 9.1  --  9.0  MG 1.7  --   --   AST  --   --  43*  ALT  --   --  33  ALKPHOS  --   --  68  BILITOT  --   --  0.7   ------------------------------------------------------------------------------------------------------------------ estimated creatinine clearance is 81.2 mL/min (by C-G formula based on SCr of 1.24 mg/dL). ------------------------------------------------------------------------------------------------------------------  Recent Labs  07/27/16 1544  HGBA1C 11.3*   ------------------------------------------------------------------------------------------------------------------ No results for input(s): CHOL, HDL, LDLCALC, TRIG, CHOLHDL, LDLDIRECT in the last 72  hours. ------------------------------------------------------------------------------------------------------------------ No results for input(s): TSH, T4TOTAL, T3FREE, THYROIDAB in the last 72 hours.  Invalid input(s): FREET3 ------------------------------------------------------------------------------------------------------------------ No results for input(s): VITAMINB12, FOLATE, FERRITIN, TIBC, IRON, RETICCTPCT in the last 72 hours.  Coagulation profile No results for input(s): INR, PROTIME in the last 168 hours.  No results for input(s): DDIMER in the last 72 hours.  Cardiac Enzymes  Recent Labs Lab 07/27/16 0931  TROPONINI <0.03   ------------------------------------------------------------------------------------------------------------------ Invalid input(s): POCBNP    Assessment & Plan   70 y m with PNA Failed outpatient therapy  *Community-acquired Pna -Failed outpatient therapy -Continue levaquin  * Accelerated HTN - continue metoprolol, losartan, torsemide    * DM - Continue metformin, patient's blood sugars likely elevated due receiving steroids yesterday all check a hemoglobin A1c Currently on Lantus   * Hyponatremia Stop IV fluids stable  * COPD no evidence of exasperation Continue home inhalers  * Insomnia - Ambien prn  *Chronic diastolic CHF Continue torsemide Stop IV fluids  *Miscellaneous continue Lovenox for DVT prophylaxis     Code Status Orders        Start     Ordered   07/27/16 1516  Full  code  Continuous     07/27/16 1515    Code Status History    Date Active Date Inactive Code Status Order ID Comments User Context   This patient has a current code status but no historical code status.    Advance Directive Documentation   Flowsheet Row Most Recent Value  Type of Advance Directive  Healthcare Power of Attorney, Living will  Pre-existing out of facility DNR order (yellow form or pink MOST form)  No data  "MOST" Form  in Place?  No data           Consults None DVT Prophylaxis  Lovenox   Lab Results  Component Value Date   PLT 207 07/28/2016     Time Spent in minutes  31min  Greater than 50% of time spent in care coordination and counseling patient regarding the condition and plan of care.   [name] M.D on 07/28/2016 at 12:58 PM  Between 7am to 6pm - Pager - [phone]  After 6pm go to [email or web address] - password [email or web address] [facility]   Office  [phone]

## 2016-07-28 NOTE — Progress Notes (Signed)
Inpatient Diabetes Program Recommendations  AACE/ADA: New Consensus Statement on Inpatient Glycemic Control (2015)  Target Ranges:  Prepandial:   less than 140 mg/dL      Peak postprandial:   less than 180 mg/dL (1-2 hours)      Critically ill patients:  140 - 180 mg/dL   Lab Results  Component Value Date   GLUCAP 364 (H) 07/27/2016    Review of Glycemic Control  Results for BOLIVAR, KORANDA (MRN [id number]) as of 07/28/2016 07:56  Ref. Range 07/27/2016 16:03 07/27/2016 20:26 07/28/2016 07:29  Glucose-Capillary Latest Ref Range: 65 - 99 mg/dL 352 (H) 364 (H) 342 (H)    Diabetes history: Type 2, A1C pending Outpatient Diabetes medications: Glucophage 1000mg  bid Current orders for Inpatient glycemic control: Lantus 10 units qhs, Glucophage 1000mg  bid, Novolog 0-15 units tid, Novolog 3 units tid  Inpatient Diabetes Program Recommendations: Consider increasing Novolog correction to resistant correction (0-20 units) and add Novolog 0-5 units qhs. Consider increasing Lantus to 19 units qhs (0.15units/kg)  Gentry Fitz, RN, [place], [place], CDE Diabetes Coordinator Inpatient Diabetes Program  [phone] (Team Pager) [phone] ([place]) 07/28/2016 7:28 AM

## 2016-07-29 LAB — GLUCOSE, CAPILLARY
Glucose-Capillary: 273 mg/dL — ABNORMAL HIGH (ref 65–99)
Glucose-Capillary: 276 mg/dL — ABNORMAL HIGH (ref 65–99)

## 2016-07-29 MED ORDER — METFORMIN HCL 1000 MG PO TABS: 1000.0000 mg | ORAL_TABLET | Freq: Two times a day (BID) | ORAL | 0 refills | Status: DC

## 2016-07-29 MED ORDER — IPRATROPIUM-ALBUTEROL 0.5-2.5 (3) MG/3ML IN SOLN: 3.0000 mL | Freq: Four times a day (QID) | RESPIRATORY_TRACT | 2 refills | Status: DC | PRN

## 2016-07-29 MED ORDER — LEVOFLOXACIN 750 MG PO TABS: 750.0000 mg | ORAL_TABLET | Freq: Every day | ORAL | 0 refills | Status: AC

## 2016-07-29 MED ORDER — GLIPIZIDE 10 MG PO TABS: 10.0000 mg | ORAL_TABLET | Freq: Two times a day (BID) | ORAL | 0 refills | Status: DC

## 2016-07-29 MED ORDER — FUROSEMIDE 10 MG/ML IJ SOLN
40.0000 mg | Freq: Once | INTRAMUSCULAR | Status: AC
  Administered 2016-07-29: 40 mg via INTRAVENOUS
  Filled 2016-07-29: qty 4

## 2016-07-29 MED ORDER — GUAIFENESIN ER 600 MG PO TB12: 600.0000 mg | ORAL_TABLET | Freq: Two times a day (BID) | ORAL | 0 refills | Status: DC

## 2016-07-29 NOTE — Care Management (Signed)
Discharge to home today per Dr. Posey Pronto. Will need nebulizer in the home. [facility] will supply nebulizer.  Family will transport. Shelbie Ammons RN MSN CCM Care Management

## 2016-07-29 NOTE — Discharge Summary (Signed)
[facility] - [place] at [facility], 65 y.o., DOB [birth year], MRN [id number]. Admission date: 07/27/2016 Discharge Date 07/29/2016 Primary MD [name], MD Admitting Physician Vladimir Crofts, MD  Admission Diagnosis  Dyspnea [R06.00] Community acquired pneumonia of right lung, unspecified part of lung [J18.9]  Discharge Diagnosis   Active Problems: Community acquired right lung  Pneumonia COPD without exasperation  Chronic diastolic CHF       Hospital Course Jason Hudson  is a 65 y.o. male with a known history of CHF as well as COPD and diabetes being admitted for pneumonia (failed outpt treatment) with one half weeks of productive cough as well as chills. He has been on Augmentin as well as Keflex as an outpatient. He started the Augmentin on November 22 and since finished it. He has been on Keflex for 1 day but with worsening symptoms on the day of admission. He was seen in the ER and was noted to have pneumonia therefore is admitted to the hospital. Patient was treated with IV antibiotics and mucolytic's with significant improvement in his symptoms. He is doing much better and is stable for discharge home.           Consults  None  Significant Tests:  See full reports for all details    Dg Chest 2 View  Result Date: 07/27/2016 CLINICAL DATA:  Productive cough, congestion EXAM: CHEST  2 VIEW COMPARISON:  04/28/2006 FINDINGS: Cardiomegaly is noted. There is elevation of the right hemidiaphragm. There is streaky infiltrate/pneumonia in right base. Follow-up to resolution is recommended. Bony thorax is unremarkable. No pulmonary edema. IMPRESSION: Streaky infiltrate/ pneumonia right base. Follow-up to resolution is recommended. No pulmonary edema. Electronically Signed   By: Lahoma Crocker M.D.   On: 07/27/2016 11:41       Today   Subjective:   Jason Hudson  feels well denies any chest pain or palpitations  Objective:   Blood  pressure (!) 155/85, pulse 89, temperature 98.2 F (36.8 C), resp. rate 17, height 5\' 9"  (1.753 m), weight 285 lb (129.3 kg), SpO2 91 %.  .  Intake/Output Summary (Last 24 hours) at 07/29/16 1249 Last data filed at 07/29/16 0900  Gross per 24 hour  Intake              600 ml  Output                0 ml  Net              600 ml    Exam VITAL SIGNS: Blood pressure (!) 155/85, pulse 89, temperature 98.2 F (36.8 C), resp. rate 17, height 5\' 9"  (1.753 m), weight 285 lb (129.3 kg), SpO2 91 %.  GENERAL:  65 y.o.-year-old patient lying in the bed with no acute distress.  EYES: Pupils equal, round, reactive to light and accommodation. No scleral icterus. Extraocular muscles intact.  HEENT: Head atraumatic, normocephalic. Oropharynx and nasopharynx clear.  NECK:  Supple, no jugular venous distention. No thyroid enlargement, no tenderness.  LUNGS: Normal breath sounds bilaterally, no wheezing, rales,rhonchi or crepitation. No use of accessory muscles of respiration.  CARDIOVASCULAR: S1, S2 normal. No murmurs, rubs, or gallops.  ABDOMEN: Soft, nontender, nondistended. Bowel sounds present. No organomegaly or mass.  EXTREMITIES: No pedal edema, cyanosis, or clubbing.  NEUROLOGIC: Cranial nerves II through XII are intact. Muscle strength 5/5 in all extremities. Sensation intact. Gait not checked.  PSYCHIATRIC: The patient is alert and oriented x 3.  SKIN: No obvious rash, lesion, or ulcer.   Data Review     CBC w Diff: Lab Results  Component Value Date   WBC 12.4 (H) 07/28/2016   HGB 14.5 07/28/2016   HCT 42.8 07/28/2016   PLT 207 07/28/2016   LYMPHOPCT 13 07/28/2016   MONOPCT 8 07/28/2016   EOSPCT 0 07/28/2016   BASOPCT 0 07/28/2016   CMP: Lab Results  Component Value Date   NA 135 07/28/2016   K 3.6 07/28/2016   CL 98 (L) 07/28/2016   CO2 25 07/28/2016   BUN 22 (H) 07/28/2016   CREATININE 1.24 07/28/2016   PROT 7.3 07/28/2016   ALBUMIN 3.4 (L) 07/28/2016   BILITOT 0.7  07/28/2016   ALKPHOS 68 07/28/2016   AST 43 (H) 07/28/2016   ALT 33 07/28/2016  .  Micro Results Recent Results (from the past 240 hour(s))  Culture, blood (routine x 2) Call MD if unable to obtain prior to antibiotics being given     Status: None (Preliminary result)   Collection Time: 07/27/16  3:44 PM  Result Value Ref Range Status   Specimen Description BLOOD RT ARM  Final   Special Requests BOTTLES DRAWN AEROBIC AND ANAEROBIC 5CC AER, 3CC  Final   Culture NO GROWTH 2 DAYS  Final   Report Status PENDING  Incomplete  Culture, blood (routine x 2) Call MD if unable to obtain prior to antibiotics being given     Status: None (Preliminary result)   Collection Time: 07/27/16  3:44 PM  Result Value Ref Range Status   Specimen Description BLOOD LT HAND  Final   Special Requests   Final    BOTTLES DRAWN AEROBIC AND ANAEROBIC [place] AER, 2CC ANA   Culture NO GROWTH 2 DAYS  Final   Report Status PENDING  Incomplete  Culture, sputum-assessment     Status: None   Collection Time: 07/27/16  4:48 PM  Result Value Ref Range Status   Specimen Description SPUTUM  Final   Special Requests NONE  Final   Sputum evaluation   Final    Sputum specimen not acceptable for testing.  Please recollect.     Report Status 07/28/2016 FINAL  Final  Culture, expectorated sputum-assessment     Status: None   Collection Time: 07/28/16  6:13 PM  Result Value Ref Range Status   Specimen Description SPUTUM  Final   Special Requests NONE  Final   Sputum evaluation THIS SPECIMEN IS ACCEPTABLE FOR SPUTUM CULTURE  Final   Report Status 07/28/2016 FINAL  Final  Culture, respiratory (NON-Expectorated)     Status: None (Preliminary result)   Collection Time: 07/28/16  6:13 PM  Result Value Ref Range Status   Specimen Description SPUTUM  Final   Special Requests NONE Reflexed from [id number]  Final   Gram Stain   Final    FEW SQUAMOUS EPITHELIAL CELLS PRESENT FEW WBC PRESENT,BOTH PMN AND MONONUCLEAR FEW GRAM POSITIVE  COCCI IN PAIRS FEW GRAM NEGATIVE RODS RARE YEAST    Culture   Final    TOO YOUNG TO READ Performed at [facility]    Report Status PENDING  Incomplete        Code Status Orders        Start     Ordered   07/27/16 1516  Full code  Continuous     07/27/16 1515    Code Status History    Date Active Date Inactive Code Status Order ID Comments User Context  This patient has a current code status but no historical code status.    Advance Directive Documentation   Flowsheet Row Most Recent Value  Type of Advance Directive  Healthcare Power of Attorney, Living will  Pre-existing out of facility DNR order (yellow form or pink MOST form)  No data  "MOST" Form in Place?  No data            Discharge Medications     Medication List    STOP taking these medications   cephALEXin 500 MG capsule Commonly known as:  KEFLEX     TAKE these medications   albuterol 108 (90 Base) MCG/ACT inhaler Commonly known as:  PROVENTIL HFA;VENTOLIN HFA Inhale 1-2 puffs into the lungs every 6 (six) hours as needed for wheezing or shortness of breath.   benzonatate 100 MG capsule Commonly known as:  TESSALON Take 100-200 mg by mouth 3 (three) times daily as needed for cough.   budesonide-formoterol 160-4.5 MCG/ACT inhaler Commonly known as:  SYMBICORT Inhale 2 puffs into the lungs 2 (two) times daily.   cholecalciferol 1000 units tablet Commonly known as:  VITAMIN D Take 1,000 Units by mouth daily.   fluticasone 50 MCG/ACT nasal spray Commonly known as:  FLONASE Place 2 sprays into both nostrils daily.   glipiZIDE 10 MG tablet Commonly known as:  GLUCOTROL Take 1 tablet (10 mg total) by mouth 2 (two) times daily before a meal.   guaiFENesin 600 MG 12 hr tablet Commonly known as:  MUCINEX Take 1 tablet (600 mg total) by mouth 2 (two) times daily.   ipratropium-albuterol 0.5-2.5 (3) MG/3ML Soln Commonly known as:  DUONEB Take 3 mLs by nebulization every 6 (six)  hours as needed.   levofloxacin 750 MG tablet Commonly known as:  LEVAQUIN Take 1 tablet (750 mg total) by mouth daily.   losartan 100 MG tablet Commonly known as:  COZAAR Take 100 mg by mouth daily.   metFORMIN 1000 MG tablet Commonly known as:  GLUCOPHAGE Take 1 tablet (1,000 mg total) by mouth 2 (two) times daily with a meal.   metoprolol succinate 25 MG 24 hr tablet Commonly known as:  TOPROL-XL Take 25 mg by mouth daily.   multivitamin Tabs tablet Take 1 tablet by mouth daily.   torsemide 20 MG tablet Commonly known as:  DEMADEX Take 20 mg by mouth daily.            Durable Medical Equipment        Start     Ordered   07/29/16 1100  For home use only DME Nebulizer machine  Once    Question:  Patient needs a nebulizer to treat with the following condition  Answer:  COPD (chronic obstructive pulmonary disease) ([place])   07/29/16 1059         Total Time in preparing paper work, data evaluation and todays exam - 40 minutes  [name] M.D on 07/29/2016 at 12:49 PM  [facility]   Office  [phone]

## 2016-07-29 NOTE — Progress Notes (Addendum)
Inpatient Diabetes Program Recommendations  AACE/ADA: New Consensus Statement on Inpatient Glycemic Control (2015)  Target Ranges:  Prepandial:   less than 140 mg/dL      Peak postprandial:   less than 180 mg/dL (1-2 hours)      Critically ill patients:  140 - 180 mg/dL   Lab Results  Component Value Date   GLUCAP 273 (H) 07/29/2016   HGBA1C 11.3 (H) 07/27/2016    Review of Glycemic Control  Results for Jason Hudson, Jason Hudson (MRN [id number]) as of 07/29/2016 07:47  Ref. Range 07/28/2016 07:29 07/28/2016 11:19 07/28/2016 16:41 07/28/2016 21:07 07/29/2016 07:30  Glucose-Capillary Latest Ref Range: 65 - 99 mg/dL 342 (H) 270 (H) 233 (H) 187 (H) 273 (H)     Diabetes history: Type 2, A1C 11.5% Outpatient Diabetes medications: Glucophage 1000mg  bid  Current orders for Inpatient glycemic control: Lantus 15 units qhs, Glucophage 1000mg  bid, Novolog 0-20 units tid, Novolog 3 units tid  Inpatient Diabetes Program Recommendations: Noted Lantus order to increase to 15 units beginning today. Agree with current medications for blood sugar management.    Gentry Fitz, RN, BA, MHA, CDE Diabetes Coordinator Inpatient Diabetes Program  [phone] (Team Pager) [phone] ([place]) 07/29/2016 7:47 AM

## 2016-07-29 NOTE — Progress Notes (Signed)
[place] at [facility] was admitted to the [facility] on 07/27/2016 and Discharged  07/29/2016 and should be excused from work/school   For 5  days starting 07/27/2016 , may return to work/school without any restrictions.  Call [name] MD with questions.  [name] M.D on 07/29/2016,at 10:58 AM  [place] at [facility]  [phone]

## 2016-07-29 NOTE — Discharge Instructions (Addendum)
Community-Acquired Pneumonia, Adult Pneumonia is an infection of the lungs. There are different types of pneumonia. One type can develop while a person is in a hospital. A different type, called community-acquired pneumonia, develops in people who are not, or have not recently been, in the hospital or other health care facility. What are the causes? Pneumonia may be caused by bacteria, viruses, or funguses. Community-acquired pneumonia is often caused by Streptococcus pneumonia bacteria. These bacteria are often passed from one person to another by breathing in droplets from the cough or sneeze of an infected person. What increases the risk? The condition is more likely to develop in:  People who havechronic diseases, such as chronic obstructive pulmonary disease (COPD), asthma, congestive heart failure, cystic fibrosis, diabetes, or kidney disease.  People who haveearly-stage or late-stage HIV.  People who havesickle cell disease.  People who havehad their spleen removed (splenectomy).  People who havepoor Human resources officer.  People who havemedical conditions that increase the risk of breathing in (aspirating) secretions their own mouth and nose.  People who havea weakened immune system (immunocompromised).  People who smoke.  People whotravel to areas where pneumonia-causing germs commonly exist.  People whoare around animal habitats or animals that have pneumonia-causing germs, including birds, bats, rabbits, cats, and farm animals. What are the signs or symptoms? Symptoms of this condition include:  Adry cough.  A wet (productive) cough.  Fever.  Sweating.  Chest pain, especially when breathing deeply or coughing.  Rapid breathing or difficulty breathing.  Shortness of breath.  Shaking chills.  Fatigue.  Muscle aches. How is this diagnosed? Your health care provider will take a medical history and perform a physical exam. You may also have other tests,  including:  Imaging studies of your chest, including X-rays.  Tests to check your blood oxygen level and other blood gases.  Other tests on blood, mucus (sputum), fluid around your lungs (pleural fluid), and urine. If your pneumonia is severe, other tests may be done to identify the specific cause of your illness. How is this treated? The type of treatment that you receive depends on many factors, such as the cause of your pneumonia, the medicines you take, and other medical conditions that you have. For most adults, treatment and recovery from pneumonia may occur at home. In some cases, treatment must happen in a hospital. Treatment may include:  Antibiotic medicines, if the pneumonia was caused by bacteria.  Antiviral medicines, if the pneumonia was caused by a virus.  Medicines that are given by mouth or through an IV tube.  Oxygen.  Respiratory therapy. Although rare, treating severe pneumonia may include:  Mechanical ventilation. This is done if you are not breathing well on your own and you cannot maintain a safe blood oxygen level.  Thoracentesis. This procedureremoves fluid around one lung or both lungs to help you breathe better. Follow these instructions at home:  Take over-the-counter and prescription medicines only as told by your health care provider.  Only takecough medicine if you are losing sleep. Understand that cough medicine can prevent your bodys natural ability to remove mucus from your lungs.  If you were prescribed an antibiotic medicine, take it as told by your health care provider. Do not stop taking the antibiotic even if you start to feel better.  Sleep in a semi-upright position at night. Try sleeping in a reclining chair, or place a few pillows under your head.  Do not use tobacco products, including cigarettes, chewing tobacco, and e-cigarettes. If  you need help quitting, ask your health care provider.  Drink enough water to keep your urine clear  or pale yellow. This will help to thin out mucus secretions in your lungs. How is this prevented? There are ways that you can decrease your risk of developing community-acquired pneumonia. Consider getting a pneumococcal vaccine if:  You are older than 63 years of age.  You are older than 63 years of age and are undergoing cancer treatment, have chronic lung disease, or have other medical conditions that affect your immune system. Ask your health care provider if this applies to you. There are different types and schedules of pneumococcal vaccines. Ask your health care provider which vaccination option is best for you. You may also prevent community-acquired pneumonia if you take these actions:  Get an influenza vaccine every year. Ask your health care provider which type of influenza vaccine is best for you.  Go to the dentist on a regular basis.  Wash your hands often. Use hand sanitizer if soap and water are not available. Contact a health care provider if:  You have a fever.  You are losing sleep because you cannot control your cough with cough medicine. Get help right away if:  You have worsening shortness of breath.  You have increased chest pain.  Your sickness becomes worse, especially if you are an older adult or have a weakened immune system.  You cough up blood. This information is not intended to replace advice given to you by your health care provider. Make sure you discuss any questions you have with your health care provider. Document Released: 08/08/2005 Document Revised: 12/17/2015 Document Reviewed: 12/03/2014 Elsevier Interactive Patient Education  2017 [place] [facility] [place]:  Cardiac diet, diabetic diet  DISCHARGE CONDITION:  Stable  ACTIVITY:  Activity as tolerated  OXYGEN:  Home Oxygen: No.   Oxygen Delivery: room air  DISCHARGE LOCATION:  home    ADDITIONAL DISCHARGE INSTRUCTION:keep log of  blood glucose , keep record take to primary md   If you experience worsening of your admission symptoms, develop shortness of breath, life threatening emergency, suicidal or homicidal thoughts you must seek medical attention immediately by calling 911 or calling your MD immediately  if symptoms less severe.  You Must read complete instructions/literature along with all the possible adverse reactions/side effects for all the Medicines you take and that have been prescribed to you. Take any new Medicines after you have completely understood and accpet all the possible adverse reactions/side effects.   Please note  You were cared for by a hospitalist during your hospital stay. If you have any questions about your discharge medications or the care you received while you were in the hospital after you are discharged, you can call the unit and asked to speak with the hospitalist on call if the hospitalist that took care of you is not available. Once you are discharged, your primary care physician will handle any further medical issues. Please note that NO REFILLS for any discharge medications will be authorized once you are discharged, as it is imperative that you return to your primary care physician (or establish a relationship with a primary care physician if you do not have one) for your aftercare needs so that they can reassess your need for medications and monitor your lab values.

## 2016-07-29 NOTE — Progress Notes (Signed)
Discharge paperwork reviewed with patient including new medications who verbalized understanding. Patient is stable for discharge. Patient to transport self home.

## 2016-07-31 LAB — CULTURE, RESPIRATORY: CULTURE: NORMAL

## 2016-07-31 LAB — CULTURE, RESPIRATORY W GRAM STAIN

## 2016-08-01 LAB — CULTURE, BLOOD (ROUTINE X 2)
CULTURE: NO GROWTH
CULTURE: NO GROWTH

## 2017-10-20 ENCOUNTER — Inpatient Hospital Stay
Admission: EM | Admit: 2017-10-20 | Discharge: 2017-10-23 | DRG: 280 | Disposition: A | Discharge status: Home / Self Care | Payer: Managed Care, Other (non HMO) | Attending: Internal Medicine | Admitting: Internal Medicine

## 2017-10-20 ENCOUNTER — Other Ambulatory Visit: Payer: Self-pay

## 2017-10-20 ENCOUNTER — Encounter: Payer: Self-pay | Admitting: Emergency Medicine

## 2017-10-20 ENCOUNTER — Emergency Department: Payer: Managed Care, Other (non HMO)

## 2017-10-20 DIAGNOSIS — Z7952 Long term (current) use of systemic steroids: Secondary | ICD-10-CM

## 2017-10-20 DIAGNOSIS — Z6841 Body Mass Index (BMI) 40.0 and over, adult: Secondary | ICD-10-CM | POA: Diagnosis not present

## 2017-10-20 DIAGNOSIS — E1122 Type 2 diabetes mellitus with diabetic chronic kidney disease: Secondary | ICD-10-CM | POA: Diagnosis present

## 2017-10-20 DIAGNOSIS — Z9989 Dependence on other enabling machines and devices: Secondary | ICD-10-CM

## 2017-10-20 DIAGNOSIS — Z7951 Long term (current) use of inhaled steroids: Secondary | ICD-10-CM

## 2017-10-20 DIAGNOSIS — I480 Paroxysmal atrial fibrillation: Secondary | ICD-10-CM | POA: Diagnosis present

## 2017-10-20 DIAGNOSIS — N189 Chronic kidney disease, unspecified: Secondary | ICD-10-CM | POA: Diagnosis present

## 2017-10-20 DIAGNOSIS — Z8249 Family history of ischemic heart disease and other diseases of the circulatory system: Secondary | ICD-10-CM | POA: Diagnosis not present

## 2017-10-20 DIAGNOSIS — I4891 Unspecified atrial fibrillation: Secondary | ICD-10-CM

## 2017-10-20 DIAGNOSIS — G4733 Obstructive sleep apnea (adult) (pediatric): Secondary | ICD-10-CM | POA: Diagnosis present

## 2017-10-20 DIAGNOSIS — J441 Chronic obstructive pulmonary disease with (acute) exacerbation: Secondary | ICD-10-CM | POA: Diagnosis present

## 2017-10-20 DIAGNOSIS — E1165 Type 2 diabetes mellitus with hyperglycemia: Secondary | ICD-10-CM | POA: Diagnosis not present

## 2017-10-20 DIAGNOSIS — Z91048 Other nonmedicinal substance allergy status: Secondary | ICD-10-CM

## 2017-10-20 DIAGNOSIS — Z79899 Other long term (current) drug therapy: Secondary | ICD-10-CM

## 2017-10-20 DIAGNOSIS — R0602 Shortness of breath: Secondary | ICD-10-CM

## 2017-10-20 DIAGNOSIS — J9601 Acute respiratory failure with hypoxia: Secondary | ICD-10-CM | POA: Diagnosis present

## 2017-10-20 DIAGNOSIS — Z7984 Long term (current) use of oral hypoglycemic drugs: Secondary | ICD-10-CM

## 2017-10-20 DIAGNOSIS — I5033 Acute on chronic diastolic (congestive) heart failure: Secondary | ICD-10-CM | POA: Diagnosis present

## 2017-10-20 DIAGNOSIS — I2510 Atherosclerotic heart disease of native coronary artery without angina pectoris: Secondary | ICD-10-CM | POA: Diagnosis present

## 2017-10-20 DIAGNOSIS — I130 Hypertensive heart and chronic kidney disease with heart failure and stage 1 through stage 4 chronic kidney disease, or unspecified chronic kidney disease: Principal | ICD-10-CM | POA: Diagnosis present

## 2017-10-20 DIAGNOSIS — I509 Heart failure, unspecified: Secondary | ICD-10-CM

## 2017-10-20 DIAGNOSIS — I214 Non-ST elevation (NSTEMI) myocardial infarction: Secondary | ICD-10-CM | POA: Diagnosis present

## 2017-10-20 DIAGNOSIS — I5030 Unspecified diastolic (congestive) heart failure: Secondary | ICD-10-CM

## 2017-10-20 LAB — HEMOGLOBIN A1C
Hgb A1c MFr Bld: 9.2 % — ABNORMAL HIGH (ref 4.8–5.6)
Mean Plasma Glucose: 217.34 mg/dL

## 2017-10-20 LAB — GLUCOSE, CAPILLARY
GLUCOSE-CAPILLARY: 169 mg/dL — AB (ref 65–99)
Glucose-Capillary: 385 mg/dL — ABNORMAL HIGH (ref 65–99)
Glucose-Capillary: 474 mg/dL — ABNORMAL HIGH (ref 65–99)
Glucose-Capillary: 347 mg/dL — ABNORMAL HIGH (ref 65–99)

## 2017-10-20 LAB — COMPREHENSIVE METABOLIC PANEL
ALBUMIN: 3.6 g/dL (ref 3.5–5.0)
ALK PHOS: 89 U/L (ref 38–126)
ALT: 62 U/L (ref 17–63)
ANION GAP: 19 — AB (ref 5–15)
AST: 104 U/L — ABNORMAL HIGH (ref 15–41)
BUN: 30 mg/dL — ABNORMAL HIGH (ref 6–20)
CALCIUM: 9 mg/dL (ref 8.9–10.3)
CHLORIDE: 95 mmol/L — AB (ref 101–111)
CO2: 22 mmol/L (ref 22–32)
Creatinine, Ser: 1.62 mg/dL — ABNORMAL HIGH (ref 0.61–1.24)
GFR calc non Af Amer: 43 mL/min — ABNORMAL LOW (ref >60)
GFR, EST AFRICAN AMERICAN: 50 mL/min — AB (ref >60)
GLUCOSE: 258 mg/dL — AB (ref 65–99)
Potassium: 3.7 mmol/L (ref 3.5–5.1)
SODIUM: 136 mmol/L (ref 135–145)
Total Bilirubin: 0.3 mg/dL (ref 0.3–1.2)
Total Protein: 7.6 g/dL (ref 6.5–8.1)

## 2017-10-20 LAB — TROPONIN I
TROPONIN I: 1.67 ng/mL — AB (ref <0.03)
TROPONIN I: 1.95 ng/mL — AB (ref <0.03)
Troponin I: 0.03 ng/mL (ref <0.03)
Troponin I: 2.14 ng/mL (ref <0.03)
Troponin I: 2.04 ng/mL (ref <0.03)

## 2017-10-20 LAB — APTT: APTT: 34 s (ref 24–36)

## 2017-10-20 LAB — TSH
TSH: 1.145 u[IU]/mL (ref 0.350–4.500)
TSH: 1.012 u[IU]/mL (ref 0.350–4.500)

## 2017-10-20 LAB — HEPARIN LEVEL (UNFRACTIONATED)
Heparin Unfractionated: 0.15 IU/mL — ABNORMAL LOW (ref 0.30–0.70)
Heparin Unfractionated: 0.35 IU/mL (ref 0.30–0.70)

## 2017-10-20 LAB — CBC
HCT: 39.5 % — ABNORMAL LOW (ref 40.0–52.0)
HEMOGLOBIN: 13.1 g/dL (ref 13.0–18.0)
MCH: 30.7 pg (ref 26.0–34.0)
MCHC: 33.2 g/dL (ref 32.0–36.0)
MCV: 92.4 fL (ref 80.0–100.0)
PLATELETS: 243 10*3/uL (ref 150–440)
RBC: 4.28 MIL/uL — AB (ref 4.40–5.90)
RDW: 14.4 % (ref 11.5–14.5)
WBC: 13.3 10*3/uL — ABNORMAL HIGH (ref 3.8–10.6)

## 2017-10-20 LAB — BRAIN NATRIURETIC PEPTIDE: B NATRIURETIC PEPTIDE 5: 155 pg/mL — AB (ref 0.0–100.0)

## 2017-10-20 LAB — PROTIME-INR
INR: 0.97
Prothrombin Time: 12.8 seconds (ref 11.4–15.2)

## 2017-10-20 MED ORDER — DOXYCYCLINE HYCLATE 100 MG PO CAPS
100.0000 mg | ORAL_CAPSULE | Freq: Two times a day (BID) | ORAL | Status: DC
  Administered 2017-10-20: 100 mg via ORAL
  Filled 2017-10-20: qty 1

## 2017-10-20 MED ORDER — GLIPIZIDE ER 10 MG PO TB24: 10.0000 mg | ORAL_TABLET | Freq: Every day | ORAL | Status: DC

## 2017-10-20 MED ORDER — FLUTICASONE PROPIONATE 50 MCG/ACT NA SUSP
2.0000 | Freq: Every day | NASAL | Status: DC
  Administered 2017-10-21: 2 via NASAL
  Filled 2017-10-20: qty 16

## 2017-10-20 MED ORDER — HEPARIN (PORCINE) IN NACL 100-0.45 UNIT/ML-% IJ SOLN
1200.0000 [IU]/h | INTRAMUSCULAR | Status: DC
  Filled 2017-10-20: qty 250

## 2017-10-20 MED ORDER — DOXYCYCLINE HYCLATE 100 MG PO TABS
ORAL_TABLET | ORAL | Status: AC
  Administered 2017-10-20: 100 mg via ORAL
  Filled 2017-10-20: qty 1

## 2017-10-20 MED ORDER — IPRATROPIUM-ALBUTEROL 0.5-2.5 (3) MG/3ML IN SOLN
3.0000 mL | Freq: Once | RESPIRATORY_TRACT | Status: AC
  Administered 2017-10-20: 3 mL via RESPIRATORY_TRACT

## 2017-10-20 MED ORDER — METHYLPREDNISOLONE SODIUM SUCC 125 MG IJ SOLR
125.0000 mg | Freq: Once | INTRAMUSCULAR | Status: AC
  Administered 2017-10-20: 125 mg via INTRAVENOUS
  Filled 2017-10-20: qty 2

## 2017-10-20 MED ORDER — CARVEDILOL 25 MG PO TABS
25.0000 mg | ORAL_TABLET | Freq: Two times a day (BID) | ORAL | Status: DC
  Administered 2017-10-20 – 2017-10-22 (×5): 25 mg via ORAL
  Filled 2017-10-20 (×6): qty 1

## 2017-10-20 MED ORDER — ACETAMINOPHEN 325 MG PO TABS
650.0000 mg | ORAL_TABLET | ORAL | Status: DC | PRN
  Administered 2017-10-20: 650 mg via ORAL
  Filled 2017-10-20: qty 2

## 2017-10-20 MED ORDER — HEPARIN SODIUM (PORCINE) 5000 UNIT/ML IJ SOLN: 5000.0000 [IU] | Freq: Three times a day (TID) | INTRAMUSCULAR | Status: DC

## 2017-10-20 MED ORDER — MOMETASONE FURO-FORMOTEROL FUM 200-5 MCG/ACT IN AERO
2.0000 | INHALATION_SPRAY | Freq: Two times a day (BID) | RESPIRATORY_TRACT | Status: DC
  Administered 2017-10-21 – 2017-10-22 (×3): 2 via RESPIRATORY_TRACT
  Filled 2017-10-20: qty 8.8

## 2017-10-20 MED ORDER — DILTIAZEM HCL 25 MG/5ML IV SOLN
10.0000 mg | Freq: Once | INTRAVENOUS | Status: AC
  Administered 2017-10-20: 10 mg via INTRAVENOUS
  Filled 2017-10-20: qty 5

## 2017-10-20 MED ORDER — LORATADINE 10 MG PO TABS
10.0000 mg | ORAL_TABLET | Freq: Every day | ORAL | Status: DC
  Administered 2017-10-20 – 2017-10-23 (×4): 10 mg via ORAL
  Filled 2017-10-20 (×4): qty 1

## 2017-10-20 MED ORDER — ONDANSETRON HCL 4 MG/2ML IJ SOLN: 4.0000 mg | Freq: Four times a day (QID) | INTRAMUSCULAR | Status: DC | PRN

## 2017-10-20 MED ORDER — POTASSIUM CHLORIDE 20 MEQ/15ML (10%) PO SOLN
20.0000 meq | Freq: Two times a day (BID) | ORAL | Status: DC
  Administered 2017-10-20 – 2017-10-22 (×5): 20 meq via ORAL
  Filled 2017-10-20 (×8): qty 15

## 2017-10-20 MED ORDER — CARVEDILOL 25 MG PO TABS
ORAL_TABLET | ORAL | Status: AC
  Filled 2017-10-20: qty 1

## 2017-10-20 MED ORDER — HEPARIN BOLUS VIA INFUSION
4000.0000 [IU] | Freq: Once | INTRAVENOUS | Status: DC
  Filled 2017-10-20: qty 4000

## 2017-10-20 MED ORDER — HEPARIN (PORCINE) IN NACL 100-0.45 UNIT/ML-% IJ SOLN
1650.0000 [IU]/h | INTRAMUSCULAR | Status: DC
  Administered 2017-10-20: 1200 [IU]/h via INTRAVENOUS
  Administered 2017-10-21 – 2017-10-23 (×4): 1500 [IU]/h via INTRAVENOUS
  Filled 2017-10-20 (×5): qty 250

## 2017-10-20 MED ORDER — SODIUM CHLORIDE 0.9% FLUSH
3.0000 mL | INTRAVENOUS | Status: DC | PRN
  Administered 2017-10-20: 3 mL via INTRAVENOUS
  Filled 2017-10-20: qty 3

## 2017-10-20 MED ORDER — LOSARTAN POTASSIUM 50 MG PO TABS
100.0000 mg | ORAL_TABLET | Freq: Every day | ORAL | Status: DC
  Administered 2017-10-21 – 2017-10-23 (×3): 100 mg via ORAL
  Filled 2017-10-20 (×4): qty 2

## 2017-10-20 MED ORDER — MOMETASONE FURO-FORMOTEROL FUM 100-5 MCG/ACT IN AERO: 2.0000 | INHALATION_SPRAY | Freq: Two times a day (BID) | RESPIRATORY_TRACT | Status: DC

## 2017-10-20 MED ORDER — SODIUM CHLORIDE 0.9 % IV SOLN: 250.0000 mL | INTRAVENOUS | Status: DC | PRN

## 2017-10-20 MED ORDER — INSULIN ASPART 100 UNIT/ML ~~LOC~~ SOLN
0.0000 [IU] | Freq: Three times a day (TID) | SUBCUTANEOUS | Status: DC
Start: 2017-10-20 — End: 2017-10-21
  Administered 2017-10-20: 15 [IU] via SUBCUTANEOUS
  Administered 2017-10-20 (×2): 20 [IU] via SUBCUTANEOUS
  Administered 2017-10-21: 7 [IU] via SUBCUTANEOUS
  Administered 2017-10-21: 11 [IU] via SUBCUTANEOUS
  Administered 2017-10-21: 4 [IU] via SUBCUTANEOUS
  Filled 2017-10-20 (×6): qty 1

## 2017-10-20 MED ORDER — HEPARIN BOLUS VIA INFUSION
3000.0000 [IU] | Freq: Once | INTRAVENOUS | Status: AC
  Administered 2017-10-20: 3000 [IU] via INTRAVENOUS
  Filled 2017-10-20: qty 3000

## 2017-10-20 MED ORDER — DOXYCYCLINE HYCLATE 100 MG PO TABS
100.0000 mg | ORAL_TABLET | Freq: Two times a day (BID) | ORAL | Status: DC
  Administered 2017-10-20 – 2017-10-23 (×6): 100 mg via ORAL
  Filled 2017-10-20 (×6): qty 1

## 2017-10-20 MED ORDER — FUROSEMIDE 10 MG/ML IJ SOLN
60.0000 mg | Freq: Once | INTRAMUSCULAR | Status: AC
  Administered 2017-10-20: 60 mg via INTRAVENOUS
  Filled 2017-10-20: qty 8

## 2017-10-20 MED ORDER — SODIUM CHLORIDE 0.9% FLUSH
3.0000 mL | Freq: Two times a day (BID) | INTRAVENOUS | Status: DC
  Administered 2017-10-21 – 2017-10-22 (×2): 3 mL via INTRAVENOUS

## 2017-10-20 MED ORDER — ASPIRIN EC 81 MG PO TBEC
81.0000 mg | DELAYED_RELEASE_TABLET | Freq: Every day | ORAL | Status: DC
  Administered 2017-10-20 – 2017-10-22 (×3): 81 mg via ORAL
  Filled 2017-10-20 (×3): qty 1

## 2017-10-20 MED ORDER — DILTIAZEM HCL 25 MG/5ML IV SOLN
10.0000 mg | Freq: Once | INTRAVENOUS | Status: AC
  Administered 2017-10-20: 10 mg via INTRAVENOUS

## 2017-10-20 MED ORDER — AMLODIPINE BESYLATE 10 MG PO TABS
10.0000 mg | ORAL_TABLET | Freq: Every day | ORAL | Status: DC
  Administered 2017-10-21 – 2017-10-23 (×3): 10 mg via ORAL
  Filled 2017-10-20: qty 2
  Filled 2017-10-20 (×3): qty 1

## 2017-10-20 MED ORDER — ALPRAZOLAM 0.25 MG PO TABS
0.2500 mg | ORAL_TABLET | Freq: Two times a day (BID) | ORAL | Status: DC | PRN
  Administered 2017-10-22: 0.25 mg via ORAL
  Filled 2017-10-20: qty 1

## 2017-10-20 MED ORDER — IPRATROPIUM-ALBUTEROL 0.5-2.5 (3) MG/3ML IN SOLN
RESPIRATORY_TRACT | Status: AC
  Administered 2017-10-20: 3 mL via RESPIRATORY_TRACT
  Filled 2017-10-20: qty 3

## 2017-10-20 MED ORDER — FUROSEMIDE 10 MG/ML IJ SOLN
80.0000 mg | Freq: Two times a day (BID) | INTRAMUSCULAR | Status: DC
  Administered 2017-10-20 – 2017-10-22 (×5): 80 mg via INTRAVENOUS
  Filled 2017-10-20 (×5): qty 8

## 2017-10-20 MED ORDER — INSULIN GLARGINE 100 UNIT/ML ~~LOC~~ SOLN
14.0000 [IU] | Freq: Every day | SUBCUTANEOUS | Status: DC
  Administered 2017-10-20 – 2017-10-22 (×3): 14 [IU] via SUBCUTANEOUS
  Filled 2017-10-20 (×4): qty 0.14

## 2017-10-20 MED ORDER — IPRATROPIUM-ALBUTEROL 0.5-2.5 (3) MG/3ML IN SOLN
3.0000 mL | RESPIRATORY_TRACT | Status: DC | PRN
  Administered 2017-10-20 – 2017-10-23 (×6): 3 mL via RESPIRATORY_TRACT
  Filled 2017-10-20 (×4): qty 3

## 2017-10-20 MED ORDER — PREDNISONE 20 MG PO TABS
40.0000 mg | ORAL_TABLET | Freq: Every day | ORAL | Status: DC
  Administered 2017-10-21 – 2017-10-22 (×2): 40 mg via ORAL
  Filled 2017-10-20 (×2): qty 2

## 2017-10-20 MED ORDER — IPRATROPIUM-ALBUTEROL 0.5-2.5 (3) MG/3ML IN SOLN
RESPIRATORY_TRACT | Status: AC
  Filled 2017-10-20: qty 3

## 2017-10-20 MED ORDER — HEPARIN BOLUS VIA INFUSION
4000.0000 [IU] | Freq: Once | INTRAVENOUS | Status: AC
  Administered 2017-10-20: 4000 [IU] via INTRAVENOUS
  Filled 2017-10-20: qty 4000

## 2017-10-20 NOTE — Consult Note (Signed)
[facility] Cardiology  CARDIOLOGY CONSULT NOTE  Patient ID: Jason Hudson MRN: [id number] DOB/AGE: [birth year] 67 y.o.  Admit date: 10/20/2017 Referring Physician [place] Primary Physician [place] Primary Cardiologist [facility] Reason for Consultation atrial fibrillation  HPI: 67 year old gentleman referred for evaluation of atrial fibrillation.  Patient recently experienced the flu, treated with doxycycline and prednisone taper.  He presents to [facility] emergency room with shortness of breath and cough, with peripheral edema.  In the emergency was noted to be in atrial fibrillation with rapid ventricular rate, converted to sinus rhythm after Cardizem bolus.  Admission labs were notable for borderline elevated troponin of 0.03, followed by 1.67 and 2.14.  Patient currently denies chest pain.  Patient was treated with heparin drip.  Patient has chads vas score of 4.  Review of systems complete and found to be negative unless listed above     Past Medical History:  Diagnosis Date  . CHF (congestive heart failure) ([place])   . COPD (chronic obstructive pulmonary disease) ([place])   . Diabetes mellitus without complication ([place])   . Hypertension     History reviewed. No pertinent surgical history.  Medications Prior to Admission  Medication Sig Dispense Refill Last Dose  . albuterol (PROVENTIL HFA;VENTOLIN HFA) 108 (90 Base) MCG/ACT inhaler Inhale 1-2 puffs into the lungs every 6 (six) hours as needed for wheezing or shortness of breath.   prn at prn  . amLODipine (NORVASC) 10 MG tablet Take 10 mg by mouth daily.   10/19/2017 at glipi 10  . benzonatate (TESSALON) 100 MG capsule Take 100-200 mg by mouth 3 (three) times daily as needed for cough.   prn at prn  . budesonide-formoterol (SYMBICORT) 160-4.5 MCG/ACT inhaler Inhale 2 puffs into the lungs 2 (two) times daily.   10/19/2017 at Unknown time  . cholecalciferol (VITAMIN D) 1000 units tablet Take 1,000 Units by mouth daily.   10/19/2017 at Unknown time  .  doxycycline (VIBRAMYCIN) 100 MG capsule Take 100 mg by mouth 2 (two) times daily.   10/19/2017 at Unknown time  . fluticasone (FLONASE) 50 MCG/ACT nasal spray Place 2 sprays into both nostrils daily.   10/19/2017 at Unknown time  . glipiZIDE (GLUCOTROL XL) 10 MG 24 hr tablet Take 10 mg by mouth daily with breakfast.   10/19/2017 at Unknown time  . hydrochlorothiazide (HYDRODIURIL) 25 MG tablet Take 1 tablet by mouth daily.   10/19/2017 at Unknown time  . ipratropium (ATROVENT) 0.02 % nebulizer solution Take 0.5 mg by nebulization every 6 (six) hours as needed for wheezing or shortness of breath.   prn at prn  . losartan (COZAAR) 100 MG tablet Take 100 mg by mouth daily.   10/19/2017 at Unknown time  . metFORMIN (GLUCOPHAGE) 1000 MG tablet Take 1 tablet (1,000 mg total) by mouth 2 (two) times daily with a meal. 30 tablet 0 10/19/2017 at Unknown time  . metoprolol (TOPROL-XL) 200 MG 24 hr tablet Take 25 mg by mouth daily.   10/19/2017 at Unknown time  . mometasone-formoterol (DULERA) 100-5 MCG/ACT AERO Inhale 2 puffs into the lungs 2 (two) times daily.   10/19/2017 at Unknown time  . predniSONE (DELTASONE) 20 MG tablet Take 40 mg by mouth daily with breakfast.   10/19/2017 at Unknown time  . torsemide (DEMADEX) 20 MG tablet Take 20 mg by mouth daily.   10/19/2017 at Unknown time  . multivitamin (ONE-A-DAY MEN'S) TABS tablet Take 1 tablet by mouth daily.   Not Taking at Unknown time   Social History  Socioeconomic History  . Marital status: Single    Spouse name: Not on file  . Number of children: Not on file  . Years of education: Not on file  . Highest education level: Not on file  Social Needs  . Financial resource strain: Not on file  . Food insecurity - worry: Not on file  . Food insecurity - inability: Not on file  . Transportation needs - medical: Not on file  . Transportation needs - non-medical: Not on file  Occupational History  . Not on file  Tobacco Use  . Smoking status: Never Smoker   . Smokeless tobacco: Never Used  Substance and Sexual Activity  . Alcohol use: No  . Drug use: Not on file  . Sexual activity: Not on file  Other Topics Concern  . Not on file  Social History Narrative  . Not on file    No family history on file.    Review of systems complete and found to be negative unless listed above      PHYSICAL EXAM  General: Well developed, well nourished, in no acute distress HEENT:  Normocephalic and atramatic Neck:  No JVD.  Lungs: Clear bilaterally to auscultation and percussion. Heart: HRRR . Normal S1 and S2 without gallops or murmurs.  Abdomen: Bowel sounds are positive, abdomen soft and non-tender  Msk:  Back normal, normal gait. Normal strength and tone for age. Extremities: No clubbing, cyanosis or edema.   Neuro: Alert and oriented X 3. Psych:  Good affect, responds appropriately  Labs:   Lab Results  Component Value Date   WBC 13.3 (H) 10/20/2017   HGB 13.1 10/20/2017   HCT 39.5 (L) 10/20/2017   MCV 92.4 10/20/2017   PLT 243 10/20/2017    Recent Labs  Lab 10/20/17 0226  NA 136  K 3.7  CL 95*  CO2 22  BUN 30*  CREATININE 1.62*  CALCIUM 9.0  PROT 7.6  BILITOT 0.3  ALKPHOS 89  ALT 62  AST 104*  GLUCOSE 258*   Lab Results  Component Value Date   TROPONINI 2.14 ([place]) 10/20/2017   No results found for: CHOL No results found for: HDL No results found for: LDLCALC No results found for: TRIG No results found for: CHOLHDL No results found for: LDLDIRECT    Radiology: Dg Chest Port 1 View  Result Date: 10/20/2017 CLINICAL DATA:  67 y/o M; 1 week of shortness of breath. Diagnosis with flu last week. History of COPD and asthma. EXAM: PORTABLE CHEST 1 VIEW COMPARISON:  07/27/2016 chest radiograph FINDINGS: The heart size and mediastinal contours are within normal limits. Perihilar bronchitic changes. No consolidation. No pleural effusion or pneumothorax. The visualized skeletal structures are unremarkable. IMPRESSION:  Perihilar bronchitic changes.  No focal consolidation. Electronically Signed   By: Kristine Garbe M.D.   On: 10/20/2017 02:53    EKG: Normal sinus rhythm  ASSESSMENT AND PLAN:   1.  Atrial fibrillation with rapid ventricular rate, converted to sinus rhythm.  Patient has chads Vasc 4. 2.  Elevated troponin, in the absence of chest pain or new ischemic ECG changes, possibly due to demand supply ischemia versus underlying coronary disease and acute coronary syndrome, currently on heparin drip 3.  Chronic diastolic congestive heart failure 4.  COPD 5.  Chronic kidney disease  Recommendations  1.  Continue current medications 2.  Continue heparin drip for now 3.  Continue diuresis 4.  Carefully monitor renal status 5.  Consider cardiac catheterization during this  hospitalization.  Will discuss further with patient.  Signed: Isaias Cowman MD,PhD, [facility] 10/20/2017, 2:29 PM

## 2017-10-20 NOTE — Progress Notes (Addendum)
[place] [facility] [place] NAME: Jason Hudson    MR#:  [id number]  DATE OF BIRTH:  05-08-1951  SUBJECTIVE:   Patient presents with shortness of breath. REVIEW OF SYSTEMS:    Review of Systems  Constitutional: Negative for fever, chills weight loss HENT: Negative for ear pain, nosebleeds, congestion, facial swelling, rhinorrhea, neck pain, neck stiffness and ear discharge.   Respiratory: Negative for cough, shortness of breath, wheezing  Cardiovascular: Positive for PND, orthopnea, weight gain, lower extremity edema denies chest pain.  Gastrointestinal: Negative for heartburn, abdominal pain, vomiting, diarrhea or consitpation Genitourinary: Negative for dysuria, urgency, frequency, hematuria Musculoskeletal: Negative for back pain or joint pain Neurological: Negative for dizziness, seizures, syncope, focal weakness,  numbness and headaches.  Hematological: Does not bruise/bleed easily.  Psychiatric/Behavioral: Negative for hallucinations, confusion, dysphoric mood    Tolerating Diet: yes      DRUG ALLERGIES:   Allergies  Allergen Reactions  . Red Dye Anaphylaxis    "not as allergic to it as I used to be. I can take a little of it"    VITALS:  Blood pressure (!) 142/81, pulse 77, temperature 97.7 F (36.5 C), temperature source Oral, height 5\' 9"  (1.753 m), weight 136.1 kg (300 lb), SpO2 98 %.  PHYSICAL EXAMINATION:  Constitutional: Morbidly obese HENT: Normocephalic. Marland Kitchen Oropharynx is clear and moist.  Eyes: Conjunctivae and EOM are normal. PERRLA, no scleral icterus.  Neck: Normal ROM. Neck supple. Positive JVD. No tracheal deviation. CVS: RRR, S1/S2 +, no murmurs, no gallops, no carotid bruit.  Pulmonary: Decreased breath sounds throughout lung fields without crackles rales or rhonchi Abdominal: Soft. BS +,  positive distension, no tenderness, rebound or guarding.  Musculoskeletal: Normal range of motion. 2+ lower extremity  edema and no tenderness.  Neuro: Alert. CN 2-12 grossly intact. No focal deficits. Skin: Skin is warm and dry. No rash noted. Psychiatric: Normal mood and affect.      LABORATORY PANEL:   CBC Recent Labs  Lab 10/20/17 0226  WBC 13.3*  HGB 13.1  HCT 39.5*  PLT 243   ------------------------------------------------------------------------------------------------------------------  Chemistries  Recent Labs  Lab 10/20/17 0226  NA 136  K 3.7  CL 95*  CO2 22  GLUCOSE 258*  BUN 30*  CREATININE 1.62*  CALCIUM 9.0  AST 104*  ALT 62  ALKPHOS 89  BILITOT 0.3   ------------------------------------------------------------------------------------------------------------------  Cardiac Enzymes Recent Labs  Lab 10/20/17 0226  TROPONINI 0.03*   ------------------------------------------------------------------------------------------------------------------  RADIOLOGY:  Dg Chest Port 1 View  Result Date: 10/20/2017 CLINICAL DATA:  67 y/o M; 1 week of shortness of breath. Diagnosis with flu last week. History of COPD and asthma. EXAM: PORTABLE CHEST 1 VIEW COMPARISON:  07/27/2016 chest radiograph FINDINGS: The heart size and mediastinal contours are within normal limits. Perihilar bronchitic changes. No consolidation. No pleural effusion or pneumothorax. The visualized skeletal structures are unremarkable. IMPRESSION: Perihilar bronchitic changes.  No focal consolidation. Electronically Signed   By: Kristine Garbe M.D.   On: 10/20/2017 02:53     ASSESSMENT AND PLAN:     67 year old morbidly obese male with history of OSA, chronic diastolic heart failure and COPD who presents shortness of breath.  1. Acute hypoxic respiratory failure due to Acute on chronic diastolic heart failure Continue IV Lasix Echocardiogram ordered  Continue to monitor intake and output with daily weight  2. Short onset atrial fibrillation now resolved Continue telemetry Follow-up with  cardiology recommendations Check echocardiogram Check TSH  Management  plans discussed with the patient and he is in agreement.   3. OSA: CPAP at night   4. Mild COPD exacerbation without wheezing currently:  Continue prednisone and doxycycline   5. Diabetes: ADA diet with sliding scale   6. Essential hypertension: Continue losartan , Norvasc metoprolol   7. Morbid obesity: Encouraged weight loss tolerated   8. Elevated troponin: Rule out ACS Heparin drip CODE STATUS: gull  TOTAL TIME TAKING CARE OF THIS PATIENT: 65 minutes.     POSSIBLE D/C 2 days, DEPENDING ON CLINICAL CONDITION.   Pierre Dellarocco M.D on 10/20/2017 at 8:20 AM  Between 7am to 6pm - Pager - [phone] After 6pm go to [email or web address] - password [email or web address] [place] [facility]  Office  [phone]  CC: Primary care physician; [name], MD  Note: This dictation was prepared with Dragon dictation along with smaller phrase technology. Any transcriptional errors that result from this process are unintentional.

## 2017-10-20 NOTE — ED Notes (Signed)
Pt assisted up to recliner for comfort; reports feeling much better now

## 2017-10-20 NOTE — Progress Notes (Signed)
Inpatient Diabetes Program Recommendations  AACE/ADA: New Consensus Statement on Inpatient Glycemic Control (2015)  Target Ranges:  Prepandial:   less than 140 mg/dL      Peak postprandial:   less than 180 mg/dL (1-2 hours)      Critically ill patients:  140 - 180 mg/dL   Lab Results  Component Value Date   GLUCAP 385 (H) 10/20/2017   HGBA1C 11.3 (H) 07/27/2016    Review of Glycemic Control  Results for Jason Hudson, Jason Hudson (MRN [id number]) as of 10/20/2017 12:30  Ref. Range 10/20/2017 12:06  Glucose-Capillary Latest Ref Range: 65 - 99 mg/dL 385 (H)     Diabetes history: Type 2 Outpatient Diabetes medications: Glucotrol 10 mg qday, Metformin 1000mg  bid, (daily prednisone) Current orders for Inpatient glycemic control: Novolog 0-20 units tid, Glucotrol 10mg  qam, * prednisone 40 mg qam   Inpatient Diabetes Program Recommendations: Please d/c Glucotrol while patient is in hospital . If A1C elevated, consider Lantus 14 units qhs  Consider adding Novolog 0-5 units qhs  Gentry Fitz, RN, [place], , CDE Diabetes Coordinator Inpatient Diabetes Program  [phone] (Team Pager) [phone] ([place]) 10/20/2017 12:34 PM

## 2017-10-20 NOTE — ED Provider Notes (Signed)
[facility] Emergency Department Provider Note   ____________________________________________   First MD Initiated Contact with Patient 10/20/17 0236     (approximate)  I have reviewed the triage vital signs and the nursing notes.   HISTORY  Chief Complaint Shortness of Breath    HPI Jason Hudson is a 67 y.o. male who presents to the ED from home with a chief complaint of shortness of breath.  Patient has a history of COPD not on oxygen, CHF, diabetes, hypertension who was diagnosed with influenza last week.  He was out of the time window for Tamiflu but his doctor started him on doxycycline.  He has 1 dose left on the antibiotic.  Reports a 3-day history of progressive shortness of breath, worse this morning.  Breathing difficulty associated with chest tightness.  Denies associated fever, chills, abdominal pain, nausea, vomiting, diarrhea.  Denies recent travel or trauma.   Past Medical History:  Diagnosis Date  . CHF (congestive heart failure) ([place])   . COPD (chronic obstructive pulmonary disease) ([place])   . Diabetes mellitus without complication ([place])   . Hypertension     Patient Active Problem List   Diagnosis Date Noted  . Pneumonia 07/27/2016    History reviewed. No pertinent surgical history.  Prior to Admission medications   Medication Sig Start Date End Date Taking? Authorizing Provider  albuterol (PROVENTIL HFA;VENTOLIN HFA) 108 (90 Base) MCG/ACT inhaler Inhale 1-2 puffs into the lungs every 6 (six) hours as needed for wheezing or shortness of breath.    [provider]  benzonatate (TESSALON) 100 MG capsule Take 100-200 mg by mouth 3 (three) times daily as needed for cough.    [provider]  budesonide-formoterol (SYMBICORT) 160-4.5 MCG/ACT inhaler Inhale 2 puffs into the lungs 2 (two) times daily.    [provider]  cholecalciferol (VITAMIN D) 1000 units tablet Take 1,000 Units by mouth daily.     [provider]  fluticasone (FLONASE) 50 MCG/ACT nasal spray Place 2 sprays into both nostrils daily.    [provider]  glipiZIDE (GLUCOTROL) 10 MG tablet Take 1 tablet (10 mg total) by mouth 2 (two) times daily before a meal. 07/29/16   [name], MD  guaiFENesin (MUCINEX) 600 MG 12 hr tablet Take 1 tablet (600 mg total) by mouth 2 (two) times daily. 07/29/16   [name], MD  ipratropium-albuterol (DUONEB) 0.5-2.5 (3) MG/3ML SOLN Take 3 mLs by nebulization every 6 (six) hours as needed. 07/29/16   [name], MD  losartan (COZAAR) 100 MG tablet Take 100 mg by mouth daily.    [provider]  metFORMIN (GLUCOPHAGE) 1000 MG tablet Take 1 tablet (1,000 mg total) by mouth 2 (two) times daily with a meal. 07/29/16   [name], MD  metoprolol succinate (TOPROL-XL) 25 MG 24 hr tablet Take 25 mg by mouth daily.    [provider]  multivitamin (ONE-A-DAY MEN'S) TABS tablet Take 1 tablet by mouth daily.    [provider]  torsemide (DEMADEX) 20 MG tablet Take 20 mg by mouth daily.    [provider]    Allergies Red dye  No family history on file.  Social History Social History   Tobacco Use  . Smoking status: Never Smoker  . Smokeless tobacco: Never Used  Substance Use Topics  . Alcohol use: No  . Drug use: Not on file    Review of Systems  Constitutional: No fever/chills. Eyes: No visual changes. ENT: No  sore throat. Cardiovascular: Positive for chest pain. Respiratory: Positive for shortness of breath. Gastrointestinal: No abdominal pain.  No nausea, no vomiting.  No diarrhea.  No constipation. Genitourinary: Negative for dysuria. Musculoskeletal: Negative for back pain. Skin: Negative for rash. Neurological: Negative for headaches, focal weakness or numbness.   ____________________________________________   PHYSICAL EXAM:  VITAL SIGNS: ED Triage Vitals  Enc Vitals Group     BP 10/20/17 0217 (!)  138/59     Pulse Rate 10/20/17 0217 83     Resp --      Temp 10/20/17 0217 97.7 F (36.5 C)     Temp Source 10/20/17 0217 Oral     SpO2 10/20/17 0217 95 %     Weight 10/20/17 0216 300 lb (136.1 kg)     Height 10/20/17 0216 5\' 9"  (1.753 m)     Head Circumference --      Peak Flow --      Pain Score --      Pain Loc --      Pain Edu? --      Excl. in [place]? --     Constitutional: Alert and oriented. Well appearing and in moderate acute distress. Eyes: Conjunctivae are normal. PERRL. EOMI. Head: Atraumatic. Nose: No congestion/rhinnorhea. Mouth/Throat: Mucous membranes are moist.  Oropharynx non-erythematous. Neck: No stridor.   Cardiovascular: Tachycardic rate, regular rhythm. Grossly normal heart sounds.  Good peripheral circulation. Respiratory: Increased respiratory effort.  No retractions. Lungs with audible wheezing. Gastrointestinal: Obese.  Soft and nontender. No distention. No abdominal bruits. No CVA tenderness. Musculoskeletal: No lower extremity tenderness nor edema.  No joint effusions. Neurologic:  Normal speech and language. No gross focal neurologic deficits are appreciated.  Skin:  Skin is warm, dry and intact. No rash noted. Psychiatric: Mood and affect are normal. Speech and behavior are normal.  ____________________________________________   LABS (all labs ordered are listed, but only abnormal results are displayed)  Labs Reviewed  CBC - Abnormal; Notable for the following components:      Result Value   WBC 13.3 (*)    RBC 4.28 (*)    HCT 39.5 (*)    All other components within normal limits  COMPREHENSIVE METABOLIC PANEL  TROPONIN I   ____________________________________________  EKG  ED ECG REPORT I, Shonya Sumida J, the attending physician, personally viewed and interpreted this ECG.   Date: 10/20/2017  EKG Time: 0227  Rate: 130  Rhythm: atrial fibrillation, rate 130  Axis: Normal  Intervals:none  ST&T Change: Nonspecific  ED ECG REPORT I,  Robi Dewolfe J, the attending physician, personally viewed and interpreted this ECG.   Date: 10/20/2017  EKG Time: 0253  Rate: 164  Rhythm: atrial fibrillation, rate 164  Axis: Normal  Intervals:none  ST&T Change: Nonspecific  ED ECG REPORT I, Lakoda Raske J, the attending physician, personally viewed and interpreted this ECG.   Date: 10/20/2017  EKG Time: 0451  Rate: 88  Rhythm: normal EKG, normal sinus rhythm  Axis: Normal  Intervals:none  ST&T Change: Nonspecific  ____________________________________________  RADIOLOGY  ED MD interpretation: No pneumonia  Official radiology report(s): Dg Chest Port 1 View  Result Date: 10/20/2017 CLINICAL DATA:  67 y/o M; 1 week of shortness of breath. Diagnosis with flu last week. History of COPD and asthma. EXAM: PORTABLE CHEST 1 VIEW COMPARISON:  07/27/2016 chest radiograph FINDINGS: The heart size and mediastinal contours are within normal limits. Perihilar bronchitic changes. No consolidation. No pleural effusion or pneumothorax. The visualized skeletal structures are unremarkable. IMPRESSION: Perihilar  bronchitic changes.  No focal consolidation. Electronically Signed   By: Kristine Garbe M.D.   On: 10/20/2017 02:53    ____________________________________________   PROCEDURES  Procedure(s) performed: None  Procedures  Critical Care performed: Yes, see critical care note(s)   CRITICAL CARE Performed by: Paulette Blanch   Total critical care time: 45 minutes  Critical care time was exclusive of separately billable procedures and treating other patients.  Critical care was necessary to treat or prevent imminent or life-threatening deterioration.  Critical care was time spent personally by me on the following activities: development of treatment plan with patient and/or surrogate as well as nursing, discussions with consultants, evaluation of patient's response to treatment, examination of patient, obtaining history from  patient or surrogate, ordering and performing treatments and interventions, ordering and review of laboratory studies, ordering and review of radiographic studies, pulse oximetry and re-evaluation of patient's condition. ____________________________________________   INITIAL IMPRESSION / ASSESSMENT AND PLAN / ED COURSE  As part of my medical decision making, I reviewed the following data within the [place] notes reviewed and incorporated, Labs reviewed, EKG interpreted, Old chart reviewed, Radiograph reviewed, Discussed with admitting physician and Notes from prior ED visits.   67 year old male with COPD and CHF who presents with shortness of breath; recently diagnosed with influenza and currently on doxycycline. Differential includes, but is not limited to, viral syndrome, bronchitis including COPD exacerbation, pneumonia, reactive airway disease including asthma, CHF including exacerbation with or without pulmonary/interstitial edema, pneumothorax, ACS, thoracic trauma, and pulmonary embolism.  Audible wheezing on exam.  Will initiate DuoNeb, add IV Solu-Medrol and reassess.  Clinical Course as of Oct 21 455  Fri Oct 20, 2017  0255 Patient noted to be in atrial fibrillation with rapid ventricular rate.  He denies prior history of atrial fibrillation.  Stable blood pressure; will administer 10 mg IV Cardizem.  [JS]  0408 HR 140s after Cardizem. Will give additional bolus of 10mg  IV.  [JS]  [id number] Patient put in a recliner for comfort.  Noted to have spontaneously converted into normal sinus rhythm at a rate of 88.  [JS]    Clinical Course User Index [JS] Paulette Blanch, MD     ____________________________________________   FINAL CLINICAL IMPRESSION(S) / ED DIAGNOSES  Final diagnoses:  SOB (shortness of breath)  COPD exacerbation (HCC)  Atrial fibrillation with rapid ventricular response [facility])     ED Discharge Orders    None       Note:  This  document was prepared using Dragon voice recognition software and may include unintentional dictation errors.    Paulette Blanch, MD 10/20/17 [phone]

## 2017-10-20 NOTE — ED Notes (Signed)
Rhythm change from afib 150's to SR 83; EKG completed and Dr Beather Arbour notified

## 2017-10-20 NOTE — Progress Notes (Signed)
[place] for heparin Indication: atrial fibrillation  Allergies  Allergen Reactions  . Red Dye Anaphylaxis    "not as allergic to it as I used to be. I can take a little of it"    Patient Measurements: Height: 5\' 9"  (175.3 cm) Weight: 300 lb (136.1 kg) IBW/kg (Calculated) : 70.7 Heparin Dosing Weight: 102.7kg  Vital Signs: Temp: 97.9 F (36.6 C) (03/01 1450) Temp Source: Oral (03/01 1450) BP: 149/68 (03/01 1450) Pulse Rate: 80 (03/01 1450)  Labs: Recent Labs    10/20/17 0226 10/20/17 0744 10/20/17 1236 10/20/17 1616  HGB 13.1  --   --   --   HCT 39.5*  --   --   --   PLT 243  --   --   --   APTT  --  34  --   --   LABPROT  --  12.8  --   --   INR  --  0.97  --   --   HEPARINUNFRC  --   --   --  0.15*  CREATININE 1.62*  --   --   --   TROPONINI 0.03* 1.67* 2.14*  --     Estimated Creatinine Clearance: 62.3 mL/min (A) (by C-G formula based on SCr of 1.62 mg/dL (H)).   Medical History: Past Medical History:  Diagnosis Date  . CHF (congestive heart failure) ([place])   . COPD (chronic obstructive pulmonary disease) ([place])   . Diabetes mellitus without complication ([place])   . Hypertension     Medications:  Scheduled:  . amLODipine  10 mg Oral Daily  . aspirin EC  81 mg Oral Daily  . carvedilol  25 mg Oral BID WC  . doxycycline  100 mg Oral Q12H  . fluticasone  2 spray Each Nare Daily  . furosemide  80 mg Intravenous BID  . [START ON 10/21/2017] glipiZIDE  10 mg Oral Q breakfast  . heparin  3,000 Units Intravenous Once  . insulin aspart  0-20 Units Subcutaneous TID WC  . loratadine  10 mg Oral Daily  . losartan  100 mg Oral Daily  . mometasone-formoterol  2 puff Inhalation BID  . potassium chloride  20 mEq Oral BID  . [START ON 10/21/2017] predniSONE  40 mg Oral Q breakfast  . sodium chloride flush  3 mL Intravenous Q12H    Assessment: Patient admitted for SOB found to have trop of 0.03 and has afib. No PTA  anticoagulation. Starting patient on heparin drip for anticoagulation for afib.  Goal of Therapy:  Heparin level 0.3-0.7 units/ml Monitor platelets by anticoagulation protocol: Yes   Plan:  HL = 0.15, subtherapeutic. Will give 3000 unit heparin bolus and increase rate to 1500 units/hr. Will obtain follow up level 6 hours after rate change.   Lendon Ka, PharmD Pharmacy Resident 10/20/2017

## 2017-10-20 NOTE — ED Notes (Signed)
Adm doctor in rm

## 2017-10-20 NOTE — ED Notes (Signed)
Date and time results received: 10/20/17 0838  Test: Troponin  Critical Value: 1.67  Name of Provider Notified: Dr. Benjie Karvonen  Orders Received? Or Actions Taken?: Per MD, orders will be placed to start Heparin.  Will continue to monitor.

## 2017-10-20 NOTE — ED Triage Notes (Addendum)
Pt to triage via w/c, audible wheezing; pt reports SHOB x week; dx with flu last wk; hx COPD & asthma; pt taken to room 26 by EDT Lattie Haw for further evaluation

## 2017-10-20 NOTE — Progress Notes (Signed)
ANTICOAGULATION CONSULT NOTE - Initial Consult  Pharmacy Consult for heparin Indication: atrial fibrillation  Allergies  Allergen Reactions  . Red Dye Anaphylaxis    "not as allergic to it as I used to be. I can take a little of it"    Patient Measurements: Height: 5\' 9"  (175.3 cm) Weight: 300 lb (136.1 kg) IBW/kg (Calculated) : 70.7 Heparin Dosing Weight: 100 kg  Vital Signs: Temp: 97.7 F (36.5 C) (03/01 0217) Temp Source: Oral (03/01 0217) BP: 142/81 (03/01 0630) Pulse Rate: 77 (03/01 0630)  Labs: Recent Labs    10/20/17 0226  HGB 13.1  HCT 39.5*  PLT 243  CREATININE 1.62*  TROPONINI 0.03*    Estimated Creatinine Clearance: 62.3 mL/min (A) (by C-G formula based on SCr of 1.62 mg/dL (H)).   Medical History: Past Medical History:  Diagnosis Date  . CHF (congestive heart failure) ([place])   . COPD (chronic obstructive pulmonary disease) ([place])   . Diabetes mellitus without complication ([place])   . Hypertension     Medications:  Scheduled:  . heparin  4,000 Units Intravenous Once  . ipratropium-albuterol        Assessment: Patient admitted for SOB found to have trop of 0.03 and has afib. No PTA anticoagulation. Starting patient on heparin drip for anticoagulation for afib.  Goal of Therapy:  Heparin level 0.3-0.7 units/ml Monitor platelets by anticoagulation protocol: Yes   Plan:  Will bolus w/ heparin 4000 units IV x 1 Will start drip @ 1200 units/hr  Baseline labs ordered Will check HL @ 1400 6 hours after start. Will monitor daily CBC's and adjust per HL's  Tobie Lords, PharmD, BCPS Clinical Pharmacist 10/20/2017

## 2017-10-20 NOTE — H&P (Signed)
Jason Hudson at [place] NAME: Jason Hudson    MR#:  [id number]  DATE OF BIRTH:  July 28, 1951  DATE OF ADMISSION:  10/20/2017  PRIMARY CARE PHYSICIAN: [name], MD   REQUESTING/REFERRING PHYSICIAN:   CHIEF COMPLAINT:   Chief Complaint  Patient presents with  . Shortness of Breath    HISTORY OF PRESENT ILLNESS: Jason Hudson  is a 67 y.o. male with a known history per below presents to the emergency room with acute shortness of breath, recently diagnosed with the flu last week-not treated with Tamiflu because he was outside the window of treatment, treated with doxycycline/prednisone taper, in the emergency room patient was found to have New onset A. fib with RVR-converted with Cardizem IV x1, chest x-ray noted for bronchitis changes, BUN 30, creatinine 1.6, white count 13,000-patient is on steroids, patient evaluated in the emergency room, patient complains of increased swelling in his legs, orthopnea, dyspnea on exertion for 2 weeks, patient is now being admitted for probable acute on chronic diastolic congestive heart failure exacerbation.  PAST MEDICAL HISTORY:   Past Medical History:  Diagnosis Date  . CHF (congestive heart failure) ([place])   . COPD (chronic obstructive pulmonary disease) ([place])   . Diabetes mellitus without complication ([place])   . Hypertension     PAST SURGICAL HISTORY: none  SOCIAL HISTORY:  Social History   Tobacco Use  . Smoking status: Never Smoker  . Smokeless tobacco: Never Used  Substance Use Topics  . Alcohol use: No    FAMILY HISTORY: htn  DRUG ALLERGIES:  Allergies  Allergen Reactions  . Red Dye Anaphylaxis    "not as allergic to it as I used to be. I can take a little of it"    REVIEW OF SYSTEMS:   CONSTITUTIONAL: No fever, fatigue or weakness.  EYES: No blurred or double vision.  EARS, NOSE, AND THROAT: No tinnitus or ear pain.  RESPIRATORY: + cough, shortness of breath, wheezing no  hemoptysis.  CARDIOVASCULAR: No chest pain, + orthopnea, edema.  GASTROINTESTINAL: No nausea, vomiting, diarrhea or abdominal pain.  GENITOURINARY: No dysuria, hematuria.  ENDOCRINE: No polyuria, nocturia,  HEMATOLOGY: No anemia, easy bruising or bleeding SKIN: No rash or lesion. MUSCULOSKELETAL: No joint pain or arthritis.   NEUROLOGIC: No tingling, numbness, weakness.  PSYCHIATRY: No anxiety or depression.   MEDICATIONS AT HOME:  Prior to Admission medications   Medication Sig Start Date End Date Taking? Authorizing Provider  albuterol (PROVENTIL HFA;VENTOLIN HFA) 108 (90 Base) MCG/ACT inhaler Inhale 1-2 puffs into the lungs every 6 (six) hours as needed for wheezing or shortness of breath.   Yes [provider]  amLODipine (NORVASC) 10 MG tablet Take 10 mg by mouth daily.   Yes [provider]  benzonatate (TESSALON) 100 MG capsule Take 100-200 mg by mouth 3 (three) times daily as needed for cough.   Yes [provider]  budesonide-formoterol (SYMBICORT) 160-4.5 MCG/ACT inhaler Inhale 2 puffs into the lungs 2 (two) times daily.   Yes [provider]  cholecalciferol (VITAMIN D) 1000 units tablet Take 1,000 Units by mouth daily.   Yes [provider]  doxycycline (VIBRAMYCIN) 100 MG capsule Take 100 mg by mouth 2 (two) times daily.   Yes [provider]  fluticasone (FLONASE) 50 MCG/ACT nasal spray Place 2 sprays into both nostrils daily.   Yes [provider]  glipiZIDE (GLUCOTROL XL) 10 MG 24 hr tablet Take 10 mg by mouth daily with breakfast.  Yes [provider]  hydrochlorothiazide (HYDRODIURIL) 25 MG tablet Take 1 tablet by mouth daily.   Yes [provider]  ipratropium (ATROVENT) 0.02 % nebulizer solution Take 0.5 mg by nebulization every 6 (six) hours as needed for wheezing or shortness of breath.   Yes [provider]  losartan (COZAAR) 100 MG tablet Take 100 mg by mouth daily.   Yes  [provider]  metFORMIN (GLUCOPHAGE) 1000 MG tablet Take 1 tablet (1,000 mg total) by mouth 2 (two) times daily with a meal. 07/29/16  Yes [name], MD  metoprolol (TOPROL-XL) 200 MG 24 hr tablet Take 25 mg by mouth daily.   Yes [provider]  mometasone-formoterol (DULERA) 100-5 MCG/ACT AERO Inhale 2 puffs into the lungs 2 (two) times daily.   Yes [provider]  predniSONE (DELTASONE) 20 MG tablet Take 40 mg by mouth daily with breakfast.   Yes [provider]  torsemide (DEMADEX) 20 MG tablet Take 20 mg by mouth daily.   Yes [provider]  multivitamin (ONE-A-DAY MEN'S) TABS tablet Take 1 tablet by mouth daily.    [provider]      PHYSICAL EXAMINATION:   VITAL SIGNS: Blood pressure (!) 142/81, pulse 77, temperature 97.7 F (36.5 C), temperature source Oral, height 5\' 9"  (1.753 m), weight 136.1 kg (300 lb), SpO2 98 %.  GENERAL:  67 y.o.-year-old patient lying in the bed with no acute distress.  Extreme morbid obesity EYES: Pupils equal, round, reactive to light and accommodation. No scleral icterus. Extraocular muscles intact.  HEENT: Head atraumatic, normocephalic. Oropharynx and nasopharynx clear.  NECK:  Supple, no jugular venous distention. No thyroid enlargement, no tenderness.  LUNGS: Rales on auscultation of the lungs bilaterally, diminished breath sounds, wheezing. No use of accessory muscles of respiration.  CARDIOVASCULAR: S1, S2 normal. No murmurs, rubs, or gallops.  ABDOMEN: Soft, nontender, nondistended. Bowel sounds present. No organomegaly or mass.  EXTREMITIES: Bilateral lower extremity pitting edema, cyanosis, or clubbing.  NEUROLOGIC: Cranial nerves II through XII are intact. Muscle strength 5/5 in all extremities. Sensation intact. Gait not checked.  PSYCHIATRIC: The patient is alert and oriented x 3.  SKIN: No obvious rash, lesion, or ulcer.   LABORATORY PANEL:   CBC Recent Labs  Lab  10/20/17 0226  WBC 13.3*  HGB 13.1  HCT 39.5*  PLT 243  MCV 92.4  MCH 30.7  MCHC 33.2  RDW 14.4   ------------------------------------------------------------------------------------------------------------------  Chemistries  Recent Labs  Lab 10/20/17 0226  NA 136  K 3.7  CL 95*  CO2 22  GLUCOSE 258*  BUN 30*  CREATININE 1.62*  CALCIUM 9.0  AST 104*  ALT 62  ALKPHOS 89  BILITOT 0.3   ------------------------------------------------------------------------------------------------------------------ estimated creatinine clearance is 62.3 mL/min (A) (by C-G formula based on SCr of 1.62 mg/dL (H)). ------------------------------------------------------------------------------------------------------------------ No results for input(s): TSH, T4TOTAL, T3FREE, THYROIDAB in the last 72 hours.  Invalid input(s): FREET3   Coagulation profile No results for input(s): INR, PROTIME in the last 168 hours. ------------------------------------------------------------------------------------------------------------------- No results for input(s): DDIMER in the last 72 hours. -------------------------------------------------------------------------------------------------------------------  Cardiac Enzymes Recent Labs  Lab 10/20/17 0226  TROPONINI 0.03*   ------------------------------------------------------------------------------------------------------------------ Invalid input(s): POCBNP  ---------------------------------------------------------------------------------------------------------------  Urinalysis No results found for: COLORURINE, APPEARANCEUR, LABSPEC, PHURINE, GLUCOSEU, HGBUR, BILIRUBINUR, KETONESUR, PROTEINUR, UROBILINOGEN, NITRITE, LEUKOCYTESUR   RADIOLOGY: Dg Chest Port 1 View  Result Date: 10/20/2017 CLINICAL DATA:  67 y/o M; 1 week of shortness of breath. Diagnosis with flu last week. History of COPD  and asthma. EXAM: PORTABLE CHEST 1 VIEW  COMPARISON:  07/27/2016 chest radiograph FINDINGS: The heart size and mediastinal contours are within normal limits. Perihilar bronchitic changes. No consolidation. No pleural effusion or pneumothorax. The visualized skeletal structures are unremarkable. IMPRESSION: Perihilar bronchitic changes.  No focal consolidation. Electronically Signed   By: Kristine Garbe M.D.   On: 10/20/2017 02:53    EKG: Orders placed or performed during the hospital encounter of 10/20/17  . ED EKG  . ED EKG  . EKG 12-Lead  . EKG 12-Lead  . EKG 12-Lead  . EKG 12-Lead  . EKG 12-Lead  . EKG 12-Lead  . ED EKG  . ED EKG    IMPRESSION AND PLAN: 1 acute on chronic diastolic congestive heart failure exacerbation Exacerbated by new onset A. fib with RVR Congestive heart failure protocol, IV Lasix twice daily, beta-blocker therapy, check echocardiogram, cardiology to see, aspirin therapy, and continue close medical monitoring  2 acute A. fib with RVR Resolved with single dose of IV Cardizem Continue beta-blocker therapy, cardiology consult with expert opinion, rule out acute coronary syndrome with cardiac enzymes x3 sets, echocardiogram, heparin drip for now  3 acute COPD exacerbation Compounded by recent influenza diagnosis-not treated with Tamiflu given presentation outside window of therapy, treated with doxycycline with prednisone taper which will be continued, aggressive pulmonary toilet with bronchodilator therapy, and supplemental oxygen as needed  4 chronic extreme morbid obesity Most likely secondary to excess calories Lifestyle modification recommended  5 chronic diabetes mellitus type 2 Stable Sliding scale insulin Accu-Cheks per routine    All the records are reviewed and case discussed with ED provider. Management plans discussed with the patient, family and they are in agreement.  CODE STATUS: Code Status History    Date Active Date Inactive Code Status Order ID Comments User  Context   07/27/2016 15:15 07/29/2016 16:01 Full Code [id number]  Max Sane, MD ED       TOTAL TIME TAKING CARE OF THIS PATIENT: 45 minutes.    Avel Peace Salary M.D on 10/20/2017   Between 7am to 6pm - Pager - [phone]  After 6pm go to [email or web address] - password [email or web address] [facility]  Office  [phone]  CC: Primary care physician; [name], MD   Note: This dictation was prepared with Dragon dictation along with smaller phrase technology. Any transcriptional errors that result from this process are unintentional.

## 2017-10-20 NOTE — Progress Notes (Signed)
Pt to be discharged this evening. Iv and tele removed. disch instructions and prescrip given to the daughter. dsich via w.c. To home accompanied by daughter.

## 2017-10-21 ENCOUNTER — Inpatient Hospital Stay
Admit: 2017-10-21 | Discharge: 2017-10-21 | Disposition: A | Discharge status: Home / Self Care | Payer: Managed Care, Other (non HMO) | Attending: Family Medicine | Admitting: Family Medicine

## 2017-10-21 ENCOUNTER — Encounter: Payer: Self-pay | Admitting: *Deleted

## 2017-10-21 ENCOUNTER — Other Ambulatory Visit: Payer: Self-pay

## 2017-10-21 DIAGNOSIS — I5030 Unspecified diastolic (congestive) heart failure: Secondary | ICD-10-CM

## 2017-10-21 LAB — CBC
HCT: 40.2 % (ref 40.0–52.0)
Hemoglobin: 13.3 g/dL (ref 13.0–18.0)
MCH: 30.5 pg (ref 26.0–34.0)
MCHC: 33.1 g/dL (ref 32.0–36.0)
MCV: 92 fL (ref 80.0–100.0)
PLATELETS: 259 10*3/uL (ref 150–440)
RBC: 4.37 MIL/uL — AB (ref 4.40–5.90)
RDW: 14.5 % (ref 11.5–14.5)
WBC: 14.5 10*3/uL — AB (ref 3.8–10.6)

## 2017-10-21 LAB — LIPID PANEL
CHOL/HDL RATIO: 4 ratio
CHOLESTEROL: 170 mg/dL (ref 0–200)
HDL: 42 mg/dL (ref >40)
LDL Cholesterol: 101 mg/dL — ABNORMAL HIGH (ref 0–99)
Triglycerides: 135 mg/dL (ref <150)
VLDL: 27 mg/dL (ref 0–40)

## 2017-10-21 LAB — GLUCOSE, CAPILLARY
GLUCOSE-CAPILLARY: 298 mg/dL — AB (ref 65–99)
GLUCOSE-CAPILLARY: 356 mg/dL — AB (ref 65–99)
Glucose-Capillary: 208 mg/dL — ABNORMAL HIGH (ref 65–99)
Glucose-Capillary: 250 mg/dL — ABNORMAL HIGH (ref 65–99)

## 2017-10-21 LAB — BASIC METABOLIC PANEL
ANION GAP: 13 (ref 5–15)
BUN: 40 mg/dL — ABNORMAL HIGH (ref 6–20)
CALCIUM: 8.7 mg/dL — AB (ref 8.9–10.3)
CO2: 26 mmol/L (ref 22–32)
CREATININE: 1.56 mg/dL — AB (ref 0.61–1.24)
Chloride: 96 mmol/L — ABNORMAL LOW (ref 101–111)
GFR, EST AFRICAN AMERICAN: 52 mL/min — AB (ref >60)
GFR, EST NON AFRICAN AMERICAN: 45 mL/min — AB (ref >60)
Glucose, Bld: 244 mg/dL — ABNORMAL HIGH (ref 65–99)
Potassium: 3.7 mmol/L (ref 3.5–5.1)
SODIUM: 135 mmol/L (ref 135–145)

## 2017-10-21 LAB — TROPONIN I
TROPONIN I: 1.28 ng/mL — AB (ref <0.03)
Troponin I: 1.04 ng/mL (ref <0.03)

## 2017-10-21 LAB — HEMOGLOBIN A1C
HEMOGLOBIN A1C: 9.4 % — AB (ref 4.8–5.6)
MEAN PLASMA GLUCOSE: 223.08 mg/dL

## 2017-10-21 LAB — HEPARIN LEVEL (UNFRACTIONATED): Heparin Unfractionated: 0.44 IU/mL (ref 0.30–0.70)

## 2017-10-21 LAB — HIV ANTIBODY (ROUTINE TESTING W REFLEX): HIV Screen 4th Generation wRfx: NONREACTIVE

## 2017-10-21 MED ORDER — INSULIN ASPART 100 UNIT/ML ~~LOC~~ SOLN
0.0000 [IU] | Freq: Three times a day (TID) | SUBCUTANEOUS | Status: DC
  Administered 2017-10-22: 3 [IU] via SUBCUTANEOUS
  Administered 2017-10-22 (×2): 15 [IU] via SUBCUTANEOUS
  Administered 2017-10-23: 4 [IU] via SUBCUTANEOUS
  Filled 2017-10-21 (×4): qty 1

## 2017-10-21 MED ORDER — INSULIN ASPART 100 UNIT/ML ~~LOC~~ SOLN
0.0000 [IU] | Freq: Every day | SUBCUTANEOUS | Status: DC
Start: 2017-10-21 — End: 2017-10-23
  Administered 2017-10-21: 5 [IU] via SUBCUTANEOUS
  Administered 2017-10-22: 4 [IU] via SUBCUTANEOUS
  Filled 2017-10-21: qty 1

## 2017-10-21 MED ORDER — PERFLUTREN LIPID MICROSPHERE
1.0000 mL | INTRAVENOUS | Status: AC | PRN
  Administered 2017-10-21: 10 mL via INTRAVENOUS
  Filled 2017-10-21: qty 10

## 2017-10-21 MED ORDER — FLUTICASONE PROPIONATE 50 MCG/ACT NA SUSP
2.0000 | Freq: Two times a day (BID) | NASAL | Status: DC
  Administered 2017-10-21 – 2017-10-22 (×2): 2 via NASAL
  Filled 2017-10-21: qty 16

## 2017-10-21 MED ORDER — ATORVASTATIN CALCIUM 20 MG PO TABS
40.0000 mg | ORAL_TABLET | Freq: Every day | ORAL | Status: DC
  Filled 2017-10-21 (×2): qty 2

## 2017-10-21 MED ORDER — INSULIN ASPART 100 UNIT/ML ~~LOC~~ SOLN
SUBCUTANEOUS | Status: AC
  Filled 2017-10-21: qty 1

## 2017-10-21 NOTE — Progress Notes (Signed)
[place] for heparin Indication: atrial fibrillation  Allergies  Allergen Reactions  . Red Dye Anaphylaxis    "not as allergic to it as I used to be. I can take a little of it"    Patient Measurements: Height: 5\' 9"  (175.3 cm) Weight: (!) 315 lb 4.8 oz (143 kg) IBW/kg (Calculated) : 70.7 Heparin Dosing Weight: 102.7kg  Vital Signs: Temp: 98 F (36.7 C) (03/02 0431) Temp Source: Oral (03/02 0431) BP: 140/79 (03/02 0431) Pulse Rate: 75 (03/02 0431)  Labs: Recent Labs    10/20/17 0226 10/20/17 0744  10/20/17 1616 10/20/17 1859 10/20/17 2215 10/21/17 0409 10/21/17 0417  HGB 13.1  --   --   --   --   --  13.3  --   HCT 39.5*  --   --   --   --   --  40.2  --   PLT 243  --   --   --   --   --  259  --   APTT  --  34  --   --   --   --   --   --   LABPROT  --  12.8  --   --   --   --   --   --   INR  --  0.97  --   --   --   --   --   --   HEPARINUNFRC  --   --   --  0.15*  --  0.35  --  0.44  CREATININE 1.62*  --   --   --   --   --  1.56*  --   TROPONINI 0.03* 1.67*   < >  --  1.95* 2.04* 1.28*  --    < > = values in this interval not displayed.    Estimated Creatinine Clearance: 66.5 mL/min (A) (by C-G formula based on SCr of 1.56 mg/dL (H)).   Medical History: Past Medical History:  Diagnosis Date  . CHF (congestive heart failure) ([place])   . COPD (chronic obstructive pulmonary disease) ([place])   . Diabetes mellitus without complication ([place])   . Hypertension     Medications:  Scheduled:  . amLODipine  10 mg Oral Daily  . aspirin EC  81 mg Oral Daily  . carvedilol  25 mg Oral BID WC  . doxycycline  100 mg Oral Q12H  . fluticasone  2 spray Each Nare Daily  . furosemide  80 mg Intravenous BID  . insulin aspart  0-20 Units Subcutaneous TID WC  . insulin glargine  14 Units Subcutaneous QHS  . loratadine  10 mg Oral Daily  . losartan  100 mg Oral Daily  . mometasone-formoterol  2 puff Inhalation BID  . potassium chloride   20 mEq Oral BID  . predniSONE  40 mg Oral Q breakfast  . sodium chloride flush  3 mL Intravenous Q12H    Assessment: Patient admitted for SOB found to have trop of 0.03 and has afib. No PTA anticoagulation. Starting patient on heparin drip for anticoagulation for afib.  Goal of Therapy:  Heparin level 0.3-0.7 units/ml Monitor platelets by anticoagulation protocol: Yes   Plan:  HL = 0.15, subtherapeutic. Will give 3000 unit heparin bolus and increase rate to 1500 units/hr. Will obtain follow up level 6 hours after rate change.  03/01 @ 2200 HL 0.35 therapeutic, Will continue current rate and recheck HL @ 0500.  03/02 @  0500 HL 0.44 therapeutic. Will continue current rate and will recheck HL w/ am labs. CBC stable.   Tobie Lords, PharmD, BCPS Clinical Pharmacist 10/21/2017

## 2017-10-21 NOTE — Progress Notes (Signed)
*  PRELIMINARY RESULTS* Echocardiogram 2D Echocardiogram has been performed. Definity IV Contrast was used on this study.  Jason Hudson 10/21/2017, 10:52 AM

## 2017-10-21 NOTE — Progress Notes (Signed)
Talked to [employer] NT to make sure patient stand in a standing scale tomorrow morning as the value today is off.

## 2017-10-21 NOTE — Progress Notes (Signed)
Notified Dr. Duane Boston about patient's blood sugar of 356 no order for bedtime sliding scale and will received 14 units of lantus, patient on prednisone, order given for bedtime sliding scale. RN will continue to monitor.

## 2017-10-21 NOTE — Progress Notes (Signed)
[facility] at [place] NAME: Jason Hudson    MR#:  [id number]  DATE OF BIRTH:  [birth year]  SUBJECTIVE:  Shortness of breath is slowly improving.   REVIEW OF SYSTEMS:    Review of Systems  Constitutional: Negative for fever, chills weight loss HENT: Negative for ear pain, nosebleeds, congestion, facial swelling, rhinorrhea, neck pain, neck stiffness and ear discharge.   Respiratory: Positive for cough, shortness of breath, no wheezing  Cardiovascular: Denies PND or orthopnea. Positive lower extremity edema Gastrointestinal: Negative for heartburn, abdominal pain, vomiting, diarrhea or consitpation Genitourinary: Negative for dysuria, urgency, frequency, hematuria Musculoskeletal: Negative for back pain or joint pain Neurological: Negative for dizziness, seizures, syncope, focal weakness,  numbness and headaches.  Hematological: Does not bruise/bleed easily.  Psychiatric/Behavioral: Negative for hallucinations, confusion, dysphoric mood    Tolerating Diet: yes      DRUG ALLERGIES:   Allergies  Allergen Reactions  . Red Dye Anaphylaxis    "not as allergic to it as I used to be. I can take a little of it"    VITALS:  Blood pressure (!) 147/83, pulse 84, temperature 97.7 F (36.5 C), temperature source Oral, resp. rate 18, height 5\' 9"  (1.753 m), weight (!) 143 kg (315 lb 4.8 oz), SpO2 90 %.  PHYSICAL EXAMINATION:  Constitutional: Morbidly obese HENT: Normocephalic. Marland Kitchen Oropharynx is clear and moist.  Eyes: Conjunctivae and EOM are normal. PERRLA, no scleral icterus.  Neck: Normal ROM. Neck supple. Positive JVD. No tracheal deviation. CVS: RRR, S1/S2 +, no murmurs, no gallops, no carotid bruit.  Pulmonary: Decreased breath sounds throughout lung fields without crackles rales or rhonchi Abdominal: Soft. BS +,  positive distension, no tenderness, rebound or guarding.  Musculoskeletal: Normal range of motion. 2+ lower extremity edema  and no tenderness.  Neuro: Alert. CN 2-12 grossly intact. No focal deficits. Skin: Skin is warm and dry. No rash noted. Psychiatric: Normal mood and affect.      LABORATORY PANEL:   CBC Recent Labs  Lab 10/21/17 0409  WBC 14.5*  HGB 13.3  HCT 40.2  PLT 259   ------------------------------------------------------------------------------------------------------------------  Chemistries  Recent Labs  Lab 10/20/17 0226 10/21/17 0409  NA 136 135  K 3.7 3.7  CL 95* 96*  CO2 22 26  GLUCOSE 258* 244*  BUN 30* 40*  CREATININE 1.62* 1.56*  CALCIUM 9.0 8.7*  AST 104*  --   ALT 62  --   ALKPHOS 89  --   BILITOT 0.3  --    ------------------------------------------------------------------------------------------------------------------  Cardiac Enzymes Recent Labs  Lab 10/20/17 2215 10/21/17 0409 10/21/17 0801  TROPONINI 2.04* 1.28* 1.04*   ------------------------------------------------------------------------------------------------------------------  RADIOLOGY:  Dg Chest Port 1 View  Result Date: 10/20/2017 CLINICAL DATA:  67 y/o M; 1 week of shortness of breath. Diagnosis with flu last week. History of COPD and asthma. EXAM: PORTABLE CHEST 1 VIEW COMPARISON:  07/27/2016 chest radiograph FINDINGS: The heart size and mediastinal contours are within normal limits. Perihilar bronchitic changes. No consolidation. No pleural effusion or pneumothorax. The visualized skeletal structures are unremarkable. IMPRESSION: Perihilar bronchitic changes.  No focal consolidation. Electronically Signed   By: Kristine Garbe M.D.   On: 10/20/2017 02:53     ASSESSMENT AND PLAN:     67 year old morbidly obese male with history of OSA, chronic diastolic heart failure and COPD who presents shortness of breath.  1. Acute hypoxic respiratory failure due to Acute on chronic diastolic heart failure Continue IV Lasix He is  5-1/2 L negative Echocardiogram pending Continue to  monitor intake and output with daily weight CHF clinic upon discharge  2. Short onset atrial fibrillation now resolved Continue telemetry CHADS Vasc score of 4. He will need anticoagulation upon discharge Follow-up on echocardiogram   3. OSA: CPAP at night   4. Mild COPD exacerbation without wheezing currently:  Continue prednisone and doxycycline   5. Diabetes: ADA diet with sliding scale  Continue Lantus as recommended by diabetes RN 6. Essential hypertension: Continue losartan , Norvasc metoprolol   7. Morbid obesity: Encouraged weight loss tolerated   8. Elevated troponin: Plan for cardiac catheterization on Monday  Continue heparin drip  Continue aspirin, Coreg Check lipid panel and A1c Add statin CODE STATUS: [place]  TOTAL TIME TAKING CARE OF THIS PATIENT: 28 minutes.   D/w dr Lorinda Creed  POSSIBLE D/C 2 days, DEPENDING ON CLINICAL CONDITION.   Durell Lofaso M.D on 10/21/2017 at 8:52 AM  Between 7am to 6pm - Pager - [phone] After 6pm go to [email or web address] - password [email or web address] [facility]  Office  [phone]  CC: Primary care physician; [name], MD  Note: This dictation was prepared with Dragon dictation along with smaller phrase technology. Any transcriptional errors that result from this process are unintentional.

## 2017-10-21 NOTE — Progress Notes (Signed)
[facility] Cardiology  SUBJECTIVE: Patient is sitting in chair, denies chest pain or shortness of breath   Vitals:   10/20/17 1450 10/20/17 2109 10/21/17 0431 10/21/17 0754  BP: (!) 149/68 116/64 140/79 (!) 147/83  Pulse: 80 83 75 84  Resp:  16 18   Temp: 97.9 F (36.6 C) 98.1 F (36.7 C) 98 F (36.7 C) 97.7 F (36.5 C)  TempSrc: Oral Oral Oral Oral  SpO2: 91% 92% 90% 90%  Weight:   (!) 143 kg (315 lb 4.8 oz)   Height:         Intake/Output Summary (Last 24 hours) at 10/21/2017 1441 Last data filed at 10/21/2017 1340 Gross per 24 hour  Intake 1427.65 ml  Output 3401 ml  Net -1973.35 ml      PHYSICAL EXAM  General: Well developed, well nourished, in no acute distress HEENT:  Normocephalic and atramatic Neck:  No JVD.  Lungs: Clear bilaterally to auscultation and percussion. Heart: HRRR . Normal S1 and S2 without gallops or murmurs.  Abdomen: Bowel sounds are positive, abdomen soft and non-tender  Msk:  Back normal, normal gait. Normal strength and tone for age. Extremities: No clubbing, cyanosis or edema.   Neuro: Alert and oriented X 3. Psych:  Good affect, responds appropriately   LABS: Basic Metabolic Panel: Recent Labs    10/20/17 0226 10/21/17 0409  NA 136 135  K 3.7 3.7  CL 95* 96*  CO2 22 26  GLUCOSE 258* 244*  BUN 30* 40*  CREATININE 1.62* 1.56*  CALCIUM 9.0 8.7*   Liver Function Tests: Recent Labs    10/20/17 0226  AST 104*  ALT 62  ALKPHOS 89  BILITOT 0.3  PROT 7.6  ALBUMIN 3.6   No results for input(s): LIPASE, AMYLASE in the last 72 hours. CBC: Recent Labs    10/20/17 0226 10/21/17 0409  WBC 13.3* 14.5*  HGB 13.1 13.3  HCT 39.5* 40.2  MCV 92.4 92.0  PLT 243 259   Cardiac Enzymes: Recent Labs    10/20/17 2215 10/21/17 0409 10/21/17 0801  TROPONINI 2.04* 1.28* 1.04*   BNP: Invalid input(s): POCBNP D-Dimer: No results for input(s): DDIMER in the last 72 hours. Hemoglobin A1C: Recent Labs    10/20/17 1009  HGBA1C 9.2*    Fasting Lipid Panel: Recent Labs    10/21/17 0408  CHOL 170  HDL 42  LDLCALC 101*  TRIG 135  CHOLHDL 4.0   Thyroid Function Tests: Recent Labs    10/20/17 1236  TSH 1.012   Anemia Panel: No results for input(s): VITAMINB12, FOLATE, FERRITIN, TIBC, IRON, RETICCTPCT in the last 72 hours.  Dg Chest Port 1 View  Result Date: 10/20/2017 CLINICAL DATA:  67 y/o M; 1 week of shortness of breath. Diagnosis with flu last week. History of COPD and asthma. EXAM: PORTABLE CHEST 1 VIEW COMPARISON:  07/27/2016 chest radiograph FINDINGS: The heart size and mediastinal contours are within normal limits. Perihilar bronchitic changes. No consolidation. No pleural effusion or pneumothorax. The visualized skeletal structures are unremarkable. IMPRESSION: Perihilar bronchitic changes.  No focal consolidation. Electronically Signed   By: Kristine Garbe M.D.   On: 10/20/2017 02:53     Echo pending  TELEMETRY: Sinus rhythm:  ASSESSMENT AND PLAN:  Active Problems:   CHF (congestive heart failure) ([place])    1.  Elevated troponin, with peak and trough, consistent with non-ST elevation myocardial infarction 2.  Atrial fibrillation, with rapid ventricular rate, converted to sinus rhythm after Cardizem bolus, chads vas score  4 3.  Chronic diastolic congestive heart failure 4.  Chronic kidney disease  Recommendations  1.  Continue current medications 2.  Continue heparin drip for now 3.  Cardiac catheterization scheduled for 10/23/2017 4.  Generous IV fluid administration prior to cardiac catheterization for renal protection 5.  Probable chronic anticoagulation following cardiac catheterization for stroke prevention   Isaias Cowman, MD, PhD, [facility] 10/21/2017 2:41 PM

## 2017-10-22 LAB — GLUCOSE, CAPILLARY
GLUCOSE-CAPILLARY: 324 mg/dL — AB (ref 65–99)
GLUCOSE-CAPILLARY: 339 mg/dL — AB (ref 65–99)
Glucose-Capillary: 194 mg/dL — ABNORMAL HIGH (ref 65–99)
Glucose-Capillary: 334 mg/dL — ABNORMAL HIGH (ref 65–99)

## 2017-10-22 LAB — ECHOCARDIOGRAM COMPLETE
HEIGHTINCHES: 69 in
WEIGHTICAEL: 5044.8 [oz_av]

## 2017-10-22 LAB — BASIC METABOLIC PANEL
Anion gap: 11 (ref 5–15)
BUN: 39 mg/dL — ABNORMAL HIGH (ref 6–20)
CALCIUM: 8.7 mg/dL — AB (ref 8.9–10.3)
CO2: 27 mmol/L (ref 22–32)
CREATININE: 1.44 mg/dL — AB (ref 0.61–1.24)
Chloride: 97 mmol/L — ABNORMAL LOW (ref 101–111)
GFR calc Af Amer: 57 mL/min — ABNORMAL LOW (ref >60)
GFR calc non Af Amer: 50 mL/min — ABNORMAL LOW (ref >60)
GLUCOSE: 218 mg/dL — AB (ref 65–99)
Potassium: 3.5 mmol/L (ref 3.5–5.1)
Sodium: 135 mmol/L (ref 135–145)

## 2017-10-22 LAB — CBC
HCT: 41.3 % (ref 40.0–52.0)
Hemoglobin: 13.6 g/dL (ref 13.0–18.0)
MCH: 30.3 pg (ref 26.0–34.0)
MCHC: 33 g/dL (ref 32.0–36.0)
MCV: 91.9 fL (ref 80.0–100.0)
PLATELETS: 254 10*3/uL (ref 150–440)
RBC: 4.5 MIL/uL (ref 4.40–5.90)
RDW: 14.5 % (ref 11.5–14.5)
WBC: 13.4 10*3/uL — ABNORMAL HIGH (ref 3.8–10.6)

## 2017-10-22 LAB — HEPARIN LEVEL (UNFRACTIONATED): HEPARIN UNFRACTIONATED: 0.33 [IU]/mL (ref 0.30–0.70)

## 2017-10-22 MED ORDER — SODIUM CHLORIDE 0.9% FLUSH
3.0000 mL | Freq: Two times a day (BID) | INTRAVENOUS | Status: DC
  Administered 2017-10-22: 3 mL via INTRAVENOUS

## 2017-10-22 MED ORDER — ASPIRIN 81 MG PO CHEW
81.0000 mg | CHEWABLE_TABLET | ORAL | Status: AC
  Administered 2017-10-23: 81 mg via ORAL
  Filled 2017-10-22: qty 1

## 2017-10-22 MED ORDER — SODIUM CHLORIDE 0.9 % WEIGHT BASED INFUSION: 1.0000 mL/kg/h | INTRAVENOUS | Status: DC

## 2017-10-22 MED ORDER — SODIUM CHLORIDE 0.9% FLUSH: 3.0000 mL | INTRAVENOUS | Status: DC | PRN

## 2017-10-22 MED ORDER — SODIUM CHLORIDE 0.9 % WEIGHT BASED INFUSION: 3.0000 mL/kg/h | INTRAVENOUS | Status: AC

## 2017-10-22 MED ORDER — SODIUM CHLORIDE 0.9 % IV SOLN: 250.0000 mL | INTRAVENOUS | Status: DC | PRN

## 2017-10-22 MED ORDER — DEXTROMETHORPHAN POLISTIREX ER 30 MG/5ML PO SUER
15.0000 mg | Freq: Two times a day (BID) | ORAL | Status: DC
  Administered 2017-10-22 (×2): 15 mg via ORAL
  Filled 2017-10-22 (×5): qty 5

## 2017-10-22 NOTE — Progress Notes (Signed)
[facility] Cardiology  SUBJECTIVE: Patient laying in bed, denies chest pain shortness of breath, reports significant diuresis over the last 24 hours with overall improvement of peripheral edema   Vitals:   10/21/17 1621 10/21/17 2011 10/22/17 0402 10/22/17 0740  BP: (!) 154/77 134/76 128/78 133/67  Pulse: 78 88 83 80  Resp: 18 18 18 14   Temp: 98.2 F (36.8 C) 98.1 F (36.7 C) 97.7 F (36.5 C) 97.9 F (36.6 C)  TempSrc: Oral Oral Oral Oral  SpO2: 91% 93% 95% 93%  Weight:   (!) 140.3 kg (309 lb 4.8 oz)   Height:         Intake/Output Summary (Last 24 hours) at 10/22/2017 0850 Last data filed at 10/22/2017 0747 Gross per 24 hour  Intake 1440 ml  Output 6501 ml  Net -5061 ml      PHYSICAL EXAM  General: Well developed, well nourished, in no acute distress HEENT:  Normocephalic and atramatic Neck:  No JVD.  Lungs: Clear bilaterally to auscultation and percussion. Heart: HRRR . Normal S1 and S2 without gallops or murmurs.  Abdomen: Bowel sounds are positive, abdomen soft and non-tender  Msk:  Back normal, normal gait. Normal strength and tone for age. Extremities: 1+ bilateral pedal edema Neuro: Alert and oriented X 3. Psych:  Good affect, responds appropriately   LABS: Basic Metabolic Panel: Recent Labs    10/21/17 0409 10/22/17 0442  NA 135 135  K 3.7 3.5  CL 96* 97*  CO2 26 27  GLUCOSE 244* 218*  BUN 40* 39*  CREATININE 1.56* 1.44*  CALCIUM 8.7* 8.7*   Liver Function Tests: Recent Labs    10/20/17 0226  AST 104*  ALT 62  ALKPHOS 89  BILITOT 0.3  PROT 7.6  ALBUMIN 3.6   No results for input(s): LIPASE, AMYLASE in the last 72 hours. CBC: Recent Labs    10/21/17 0409 10/22/17 0442  WBC 14.5* 13.4*  HGB 13.3 13.6  HCT 40.2 41.3  MCV 92.0 91.9  PLT 259 254   Cardiac Enzymes: Recent Labs    10/20/17 2215 10/21/17 0409 10/21/17 0801  TROPONINI 2.04* 1.28* 1.04*   BNP: Invalid input(s): POCBNP D-Dimer: No results for input(s): DDIMER in the last  72 hours. Hemoglobin A1C: Recent Labs    10/21/17 0408  HGBA1C 9.4*   Fasting Lipid Panel: Recent Labs    10/21/17 0408  CHOL 170  HDL 42  LDLCALC 101*  TRIG 135  CHOLHDL 4.0   Thyroid Function Tests: Recent Labs    10/20/17 1236  TSH 1.012   Anemia Panel: No results for input(s): VITAMINB12, FOLATE, FERRITIN, TIBC, IRON, RETICCTPCT in the last 72 hours.  No results found.   Echo pending  TELEMETRY: Sinus rhythm:  ASSESSMENT AND PLAN:  Active Problems:   CHF (congestive heart failure) ([place])    1.  Non-ST elevation myocardial infarction 2.  Atrial fibrillation with rapid ventricular rate, converted to sinus rhythm after Cardizem bolus, chads Vascor 4, currently on heparin drip 3.  Acute on chronic diastolic congestive heart failure, much improved after brisk diuresis 4.  Chronic kidney disease, BUN and creatinine improved after diuresis  Recommendations  1.  Continue current medications 2.  Continue heparin drip for now 3.  Cardiac catheterization scheduled for 10/23/2017 4.  Generous IV fluid administration prior to and following cardiac catheterization for renal protection 5.  Probable change to chronic anticoagulation for stroke prevention following cardiac catheterization   Isaias Cowman, MD, PhD, [facility] 10/22/2017 8:50 AM

## 2017-10-22 NOTE — Plan of Care (Signed)
Pt is A&Ox4. VSS . RA . Ambulatory. Heparin gtt continued per order. NSR on monitor. Pt to go to cath lab tomorrow morning. No complaints thus far. Will continue to monitor and report to oncoming RN .  Progressing Health Behavior/Discharge Planning: Ability to manage health-related needs will improve 10/22/2017 1511 - Progressing by Aleen Campi, RN Clinical Measurements: Ability to maintain clinical measurements within normal limits will improve 10/22/2017 1511 - Progressing by Aleen Campi, RN Will remain free from infection 10/22/2017 1511 - Progressing by Aleen Campi, RN Diagnostic test results will improve 10/22/2017 1511 - Progressing by Aleen Campi, RN Respiratory complications will improve 10/22/2017 1511 - Progressing by Aleen Campi, RN Cardiovascular complication will be avoided 10/22/2017 1511 - Progressing by Aleen Campi, RN Activity: Risk for activity intolerance will decrease 10/22/2017 1511 - Progressing by Aleen Campi, RN Nutrition: Adequate nutrition will be maintained 10/22/2017 1511 - Progressing by Aleen Campi, RN Elimination: Will not experience complications related to bowel motility 10/22/2017 1511 - Progressing by Aleen Campi, RN Pain Managment: General experience of comfort will improve 10/22/2017 1511 - Progressing by Aleen Campi, RN Safety: Ability to remain free from injury will improve 10/22/2017 1511 - Progressing by Aleen Campi, RN Skin Integrity: Risk for impaired skin integrity will decrease 10/22/2017 1511 - Progressing by Aleen Campi, RN Cardiovascular: Ability to achieve and maintain adequate cardiovascular perfusion will improve 10/22/2017 1511 - Progressing by Aleen Campi, RN

## 2017-10-22 NOTE — Progress Notes (Signed)
[place] for heparin Indication: atrial fibrillation  Allergies  Allergen Reactions  . Red Dye Anaphylaxis    "not as allergic to it as I used to be. I can take a little of it"    Patient Measurements: Height: 5\' 9"  (175.3 cm) Weight: (!) 309 lb 4.8 oz (140.3 kg) IBW/kg (Calculated) : 70.7 Heparin Dosing Weight: 102.7kg  Vital Signs: Temp: 97.7 F (36.5 C) (03/03 0402) Temp Source: Oral (03/03 0402) BP: 128/78 (03/03 0402) Pulse Rate: 83 (03/03 0402)  Labs: Recent Labs    10/20/17 0226 10/20/17 0744  10/20/17 2215 10/21/17 0409 10/21/17 0417 10/21/17 0801 10/22/17 0442  HGB 13.1  --   --   --  13.3  --   --  13.6  HCT 39.5*  --   --   --  40.2  --   --  41.3  PLT 243  --   --   --  259  --   --  254  APTT  --  34  --   --   --   --   --   --   LABPROT  --  12.8  --   --   --   --   --   --   INR  --  0.97  --   --   --   --   --   --   HEPARINUNFRC  --   --    < > 0.35  --  0.44  --  0.33  CREATININE 1.62*  --   --   --  1.56*  --   --  1.44*  TROPONINI 0.03* 1.67*   < > 2.04* 1.28*  --  1.04*  --    < > = values in this interval not displayed.    Estimated Creatinine Clearance: 71.3 mL/min (A) (by C-G formula based on SCr of 1.44 mg/dL (H)).   Medical History: Past Medical History:  Diagnosis Date  . CHF (congestive heart failure) ([place])   . COPD (chronic obstructive pulmonary disease) ([place])   . Diabetes mellitus without complication ([place])   . Hypertension     Medications:  Scheduled:  . amLODipine  10 mg Oral Daily  . aspirin EC  81 mg Oral Daily  . atorvastatin  40 mg Oral q1800  . carvedilol  25 mg Oral BID WC  . dextromethorphan  15 mg Oral Q12H  . doxycycline  100 mg Oral Q12H  . fluticasone  2 spray Each Nare BID  . furosemide  80 mg Intravenous BID  . insulin aspart  0-20 Units Subcutaneous TID WC  . insulin aspart  0-5 Units Subcutaneous QHS  . insulin glargine  14 Units Subcutaneous QHS  . loratadine   10 mg Oral Daily  . losartan  100 mg Oral Daily  . mometasone-formoterol  2 puff Inhalation BID  . potassium chloride  20 mEq Oral BID  . predniSONE  40 mg Oral Q breakfast  . sodium chloride flush  3 mL Intravenous Q12H    Assessment: Patient admitted for SOB found to have trop of 0.03 and has afib. No PTA anticoagulation. Starting patient on heparin drip for anticoagulation for afib.  Goal of Therapy:  Heparin level 0.3-0.7 units/ml Monitor platelets by anticoagulation protocol: Yes   Plan:  HL = 0.15, subtherapeutic. Will give 3000 unit heparin bolus and increase rate to 1500 units/hr. Will obtain follow up level 6 hours after rate change.  03/01 @ 2200 HL 0.35 therapeutic, Will continue current rate and recheck HL @ 0500.  03/02 @ 0500 HL 0.44 therapeutic. Will continue current rate and will recheck HL w/ am labs. CBC stable.   03/03 @ 0500 HL 0.33 therapeutic. Will continue current rate and will recheck HL w/ am labs. CBC stable.  Tobie Lords, PharmD, BCPS Clinical Pharmacist 10/22/2017

## 2017-10-22 NOTE — Plan of Care (Signed)
  Progressing Clinical Measurements: Respiratory complications will improve 10/22/2017 0459 - Progressing by Jeri Cos, RN Cardiovascular complication will be avoided 10/22/2017 0459 - Progressing by Jeri Cos, RN

## 2017-10-22 NOTE — Progress Notes (Signed)
Jason Hudson at [place] NAME: Jason Hudson    MR#:  [id number]  DATE OF BIRTH:  05-Nov-1950  SUBJECTIVE:  Scrotal edema and lower extremity edema has improved.  REVIEW OF SYSTEMS:    Review of Systems  Constitutional: Negative for fever, chills weight loss HENT: Negative for ear pain, nosebleeds, congestion, facial swelling, rhinorrhea, neck pain, neck stiffness and ear discharge.   Respiratory: Positive for cough, shortness of breath, no wheezing  Cardiovascular: Denies PND or orthopnea. Positive lower extremity edema Gastrointestinal: Negative for heartburn, abdominal pain, vomiting, diarrhea or consitpation Genitourinary: Negative for dysuria, urgency, frequency, hematuria Musculoskeletal: Negative for back pain or joint pain Neurological: Negative for dizziness, seizures, syncope, focal weakness,  numbness and headaches.  Hematological: Does not bruise/bleed easily.  Psychiatric/Behavioral: Negative for hallucinations, confusion, dysphoric mood    Tolerating Diet: yes      DRUG ALLERGIES:   Allergies  Allergen Reactions  . Red Dye Anaphylaxis    "not as allergic to it as I used to be. I can take a little of it"    VITALS:  Blood pressure 133/67, pulse 80, temperature 97.9 F (36.6 C), temperature source Oral, resp. rate 14, height 5\' 9"  (1.753 m), weight (!) 140.3 kg (309 lb 4.8 oz), SpO2 93 %.  PHYSICAL EXAMINATION:  Constitutional: Morbidly obese HENT: Normocephalic. Marland Kitchen Oropharynx is clear and moist.  Eyes: Conjunctivae and EOM are normal. PERRLA, no scleral icterus.  Neck: Normal ROM. Neck supple. Positive JVD. No tracheal deviation. CVS: RRR, S1/S2 +, no murmurs, no gallops, no carotid bruit.  Pulmonary: Decreased breath sounds throughout lung fields without crackles rales or rhonchi Abdominal: Soft. BS +,  positive distension, no tenderness, rebound or guarding.  Musculoskeletal: Normal range of motion. 2+ lower  extremity edema and no tenderness.  Neuro: Alert. CN 2-12 grossly intact. No focal deficits. Skin: Skin is warm and dry. No rash noted. Psychiatric: Normal mood and affect.      LABORATORY PANEL:   CBC Recent Labs  Lab 10/22/17 0442  WBC 13.4*  HGB 13.6  HCT 41.3  PLT 254   ------------------------------------------------------------------------------------------------------------------  Chemistries  Recent Labs  Lab 10/20/17 0226  10/22/17 0442  NA 136   < > 135  K 3.7   < > 3.5  CL 95*   < > 97*  CO2 22   < > 27  GLUCOSE 258*   < > 218*  BUN 30*   < > 39*  CREATININE 1.62*   < > 1.44*  CALCIUM 9.0   < > 8.7*  AST 104*  --   --   ALT 62  --   --   ALKPHOS 89  --   --   BILITOT 0.3  --   --    < > = values in this interval not displayed.   ------------------------------------------------------------------------------------------------------------------  Cardiac Enzymes Recent Labs  Lab 10/20/17 2215 10/21/17 0409 10/21/17 0801  TROPONINI 2.04* 1.28* 1.04*   ------------------------------------------------------------------------------------------------------------------  RADIOLOGY:  No results found.   ASSESSMENT AND PLAN:     67 year old morbidly obese male with history of OSA, chronic diastolic heart failure and COPD who presents shortness of breath.  1. Acute hypoxic respiratory failure due to Acute on chronic diastolic heart failure Patient has been weaned to room air Continue IV Lasix 80 twice a day with plan to change to oral tomorrow He is over 10 L negative Echocardiogram results are pending Continue to monitor intake and output with  daily weight CHF clinic upon discharge  2. Short onset atrial fibrillation now resolved Continue telemetry CHADS Vasc score of 4. He will need anticoagulation upon discharge Follow-up on echocardiogram   3. OSA: CPAP at night   4. Mild COPD exacerbation without wheezing currently:  Continue  prednisone and doxycycline   5. Poorly controlled Diabetes: ADA diet with sliding scale  Hemoglobin A1c 9.4  He will need close outpatient follow-up   Continue Lantus as recommended by diabetes RN  6. Essential hypertension: Continue losartan , Norvasc metoprolol   7. Morbid obesity: Encouraged weight loss tolerated   8. Elevated troponin: Plan for cardiac catheterization on Monday  Continue heparin drip  Continue aspirin, Coreg, statin LDL 101 which is not at goal  CODE STATUS: full  TOTAL TIME TAKING CARE OF THIS PATIENT: 28 minutes.   D/w dr Lorinda Creed  POSSIBLE D/C 1-2 days DEPENDING ON CLINICAL CONDITION.   Jaycie Kregel M.D on 10/22/2017 at 10:18 AM  Between 7am to 6pm - Pager - [phone] After 6pm go to [email or web address] - password [email or web address] [facility]  Office  [phone]  CC: Primary care physician; [name], MD  Note: This dictation was prepared with Dragon dictation along with smaller phrase technology. Any transcriptional errors that result from this process are unintentional.

## 2017-10-22 NOTE — Care Management (Signed)
From home. Independent in all adls, denies issues accessing medical care, obtaining medications or with transportation.  Current with  PCP. New atrial fib which converted in the ED. Acute on chronic CHF. Room air.

## 2017-10-23 ENCOUNTER — Encounter: Payer: Self-pay | Admitting: Cardiology

## 2017-10-23 ENCOUNTER — Encounter
Admission: EM | Disposition: A | Discharge status: Home / Self Care | Payer: Self-pay | Source: Home / Self Care | Attending: Internal Medicine

## 2017-10-23 HISTORY — PX: LEFT HEART CATH AND CORONARY ANGIOGRAPHY: CATH118249

## 2017-10-23 LAB — CBC
HEMATOCRIT: 40.8 % (ref 40.0–52.0)
HEMOGLOBIN: 13.4 g/dL (ref 13.0–18.0)
MCH: 30.2 pg (ref 26.0–34.0)
MCHC: 32.9 g/dL (ref 32.0–36.0)
MCV: 91.7 fL (ref 80.0–100.0)
Platelets: 253 10*3/uL (ref 150–440)
RBC: 4.45 MIL/uL (ref 4.40–5.90)
RDW: 14.5 % (ref 11.5–14.5)
WBC: 13.3 10*3/uL — ABNORMAL HIGH (ref 3.8–10.6)

## 2017-10-23 LAB — GLUCOSE, CAPILLARY
GLUCOSE-CAPILLARY: 190 mg/dL — AB (ref 65–99)
GLUCOSE-CAPILLARY: 178 mg/dL — AB (ref 65–99)

## 2017-10-23 LAB — BASIC METABOLIC PANEL
ANION GAP: 12 (ref 5–15)
BUN: 36 mg/dL — ABNORMAL HIGH (ref 6–20)
CHLORIDE: 98 mmol/L — AB (ref 101–111)
CO2: 26 mmol/L (ref 22–32)
Calcium: 8.9 mg/dL (ref 8.9–10.3)
Creatinine, Ser: 1.54 mg/dL — ABNORMAL HIGH (ref 0.61–1.24)
GFR calc non Af Amer: 46 mL/min — ABNORMAL LOW (ref >60)
GFR, EST AFRICAN AMERICAN: 53 mL/min — AB (ref >60)
GLUCOSE: 243 mg/dL — AB (ref 65–99)
POTASSIUM: 3.6 mmol/L (ref 3.5–5.1)
Sodium: 136 mmol/L (ref 135–145)

## 2017-10-23 LAB — HEPARIN LEVEL (UNFRACTIONATED): HEPARIN UNFRACTIONATED: 0.28 [IU]/mL — AB (ref 0.30–0.70)

## 2017-10-23 SURGERY — LEFT HEART CATH AND CORONARY ANGIOGRAPHY
Anesthesia: Moderate Sedation

## 2017-10-23 MED ORDER — HEPARIN SODIUM (PORCINE) 1000 UNIT/ML IJ SOLN
INTRAMUSCULAR | Status: AC
  Filled 2017-10-23: qty 1

## 2017-10-23 MED ORDER — SODIUM CHLORIDE 0.9% FLUSH: 3.0000 mL | Freq: Two times a day (BID) | INTRAVENOUS | Status: DC

## 2017-10-23 MED ORDER — IOPAMIDOL (ISOVUE-300) INJECTION 61%
INTRAVENOUS | Status: DC | PRN
  Administered 2017-10-23: 61 mL via INTRA_ARTERIAL

## 2017-10-23 MED ORDER — APIXABAN 5 MG PO TABS: 5.0000 mg | ORAL_TABLET | Freq: Two times a day (BID) | ORAL | 0 refills | Status: DC

## 2017-10-23 MED ORDER — ATORVASTATIN CALCIUM 40 MG PO TABS: 40.0000 mg | ORAL_TABLET | Freq: Every day | ORAL | 0 refills | Status: DC

## 2017-10-23 MED ORDER — MIDAZOLAM HCL 2 MG/2ML IJ SOLN
INTRAMUSCULAR | Status: DC | PRN
  Administered 2017-10-23: 1 mg via INTRAVENOUS

## 2017-10-23 MED ORDER — NITROGLYCERIN 0.4 MG SL SUBL: 0.4000 mg | SUBLINGUAL_TABLET | SUBLINGUAL | 0 refills | Status: AC | PRN

## 2017-10-23 MED ORDER — SODIUM CHLORIDE 0.9 % IV SOLN: 250.0000 mL | INTRAVENOUS | Status: DC | PRN

## 2017-10-23 MED ORDER — TORSEMIDE 20 MG PO TABS: 40.0000 mg | ORAL_TABLET | Freq: Every day | ORAL | 0 refills | Status: DC

## 2017-10-23 MED ORDER — HYDROCOD POLST-CPM POLST ER 10-8 MG/5ML PO SUER
ORAL | Status: AC
  Filled 2017-10-23: qty 5

## 2017-10-23 MED ORDER — ACETAMINOPHEN 325 MG PO TABS: 650.0000 mg | ORAL_TABLET | ORAL | Status: DC | PRN

## 2017-10-23 MED ORDER — HEPARIN (PORCINE) IN NACL 2-0.9 UNIT/ML-% IJ SOLN
INTRAMUSCULAR | Status: AC
  Filled 2017-10-23: qty 1000

## 2017-10-23 MED ORDER — HYDROCOD POLST-CPM POLST ER 10-8 MG/5ML PO SUER
ORAL | Status: DC | PRN
  Administered 2017-10-23: 2.5 mL via ORAL

## 2017-10-23 MED ORDER — HYDROCOD POLST-CPM POLST ER 10-8 MG/5ML PO SUER
5.0000 mL | Freq: Once | ORAL | Status: AC
  Administered 2017-10-23: 5 mL via ORAL

## 2017-10-23 MED ORDER — SODIUM CHLORIDE 0.9 % WEIGHT BASED INFUSION: 1.0000 mL/kg/h | INTRAVENOUS | Status: DC

## 2017-10-23 MED ORDER — SODIUM CHLORIDE 0.9 % WEIGHT BASED INFUSION
3.0000 mL/kg/h | INTRAVENOUS | Status: AC
  Administered 2017-10-23: 0.715 mL/kg/h via INTRAVENOUS

## 2017-10-23 MED ORDER — SODIUM CHLORIDE 0.9% FLUSH: 3.0000 mL | INTRAVENOUS | Status: DC | PRN

## 2017-10-23 MED ORDER — ONDANSETRON HCL 4 MG/2ML IJ SOLN: 4.0000 mg | Freq: Four times a day (QID) | INTRAMUSCULAR | Status: DC | PRN

## 2017-10-23 MED ORDER — ASPIRIN 81 MG PO CHEW: 81.0000 mg | CHEWABLE_TABLET | ORAL | Status: DC

## 2017-10-23 MED ORDER — POTASSIUM CHLORIDE CRYS ER 10 MEQ PO TBCR
10.0000 meq | EXTENDED_RELEASE_TABLET | Freq: Every day | ORAL | Status: DC
  Administered 2017-10-23: 10 meq via ORAL
  Filled 2017-10-23: qty 1

## 2017-10-23 MED ORDER — SODIUM CHLORIDE 0.9 % WEIGHT BASED INFUSION
1.0000 mL/kg/h | INTRAVENOUS | Status: DC
  Administered 2017-10-23: 1 mL/kg/h via INTRAVENOUS

## 2017-10-23 MED ORDER — HEPARIN BOLUS VIA INFUSION
1400.0000 [IU] | Freq: Once | INTRAVENOUS | Status: AC
  Administered 2017-10-23: 1400 [IU] via INTRAVENOUS
  Filled 2017-10-23: qty 1400

## 2017-10-23 MED ORDER — POTASSIUM CHLORIDE CRYS ER 10 MEQ PO TBCR: 10.0000 meq | EXTENDED_RELEASE_TABLET | Freq: Every day | ORAL | 0 refills | Status: DC

## 2017-10-23 MED ORDER — MIDAZOLAM HCL 2 MG/2ML IJ SOLN
INTRAMUSCULAR | Status: AC
  Filled 2017-10-23: qty 2

## 2017-10-23 MED ORDER — FENTANYL CITRATE (PF) 100 MCG/2ML IJ SOLN
INTRAMUSCULAR | Status: AC
  Filled 2017-10-23: qty 2

## 2017-10-23 MED ORDER — ASPIRIN 81 MG PO TBEC: 81.0000 mg | DELAYED_RELEASE_TABLET | Freq: Every day | ORAL | Status: DC

## 2017-10-23 MED ORDER — HEPARIN SODIUM (PORCINE) 1000 UNIT/ML IJ SOLN
INTRAMUSCULAR | Status: DC | PRN
  Administered 2017-10-23: 7000 [IU] via INTRAVENOUS

## 2017-10-23 MED ORDER — CARVEDILOL 25 MG PO TABS: 25.0000 mg | ORAL_TABLET | Freq: Two times a day (BID) | ORAL | 0 refills | Status: DC

## 2017-10-23 MED ORDER — FENTANYL CITRATE (PF) 100 MCG/2ML IJ SOLN
INTRAMUSCULAR | Status: DC | PRN
  Administered 2017-10-23: 25 ug via INTRAVENOUS

## 2017-10-23 MED ORDER — VERAPAMIL HCL 2.5 MG/ML IV SOLN
INTRAVENOUS | Status: AC
  Filled 2017-10-23: qty 2

## 2017-10-23 MED ORDER — IPRATROPIUM-ALBUTEROL 0.5-2.5 (3) MG/3ML IN SOLN
RESPIRATORY_TRACT | Status: AC
  Administered 2017-10-23: 3 mL via RESPIRATORY_TRACT
  Filled 2017-10-23: qty 3

## 2017-10-23 SURGICAL SUPPLY — 5 items
CATH 5F 110X4 TIG (CATHETERS) ×2 IMPLANT
CATH INFINITI JR4 5F (CATHETERS) ×2 IMPLANT
KIT MANI 3VAL PERCEP (MISCELLANEOUS) ×2 IMPLANT
PACK CARDIAC CATH (CUSTOM PROCEDURE TRAY) ×2 IMPLANT
WIRE ROSEN-J .035X260CM (WIRE) ×2 IMPLANT

## 2017-10-23 NOTE — Discharge Instructions (Signed)

## 2017-10-23 NOTE — Plan of Care (Signed)
No voiced complaints of pain.  SR continues on telemetry.

## 2017-10-23 NOTE — Care Management Important Message (Signed)
Important Message  Patient Details  Name: Jason Hudson MRN: [id number] Date of Birth: July 08, 1951   Medicare Important Message Given:  Yes    Katrina Stack, RN 10/23/2017, 2:40 PM

## 2017-10-23 NOTE — Discharge Summary (Addendum)
[facility] NAME: Jason Hudson    MR#:  [id number]  DATE OF BIRTH:  March 16, 1951  DATE OF ADMISSION:  10/20/2017 ADMITTING PHYSICIAN: Gorden Harms, MD  DATE OF DISCHARGE: 10/23/2017  PRIMARY CARE PHYSICIAN: [name], MD    ADMISSION DIAGNOSIS:  SOB (shortness of breath) [R06.02] COPD exacerbation (HCC) [J44.1] Atrial fibrillation with rapid ventricular response ([place]) [I48.91]  DISCHARGE DIAGNOSIS:  Active Problems:   CHF (congestive heart failure) ([place]) NSTEMI  SECONDARY DIAGNOSIS:   Past Medical History:  Diagnosis Date  . CHF (congestive heart failure) ([place])   . COPD (chronic obstructive pulmonary disease) ([place])   . Diabetes mellitus without complication ([place])   . Hypertension     HOSPITAL COURSE:    67 year old morbidly obese male with history of OSA, chronic diastolic heart failure and COPD who presents shortness of breath.  1. Acute hypoxic respiratory failure due to Acute on chronic diastolic heart failure Patient has been weaned to room air Patient has diuresed well. He will be discharged on increased dose of torsemide with potassium supplementation. He is referred to CHF clinic upon discharge.  2. Short onset atrial fibrillation now resolved (PAF) CHADS Vasc score of 4. Recommendations are to start oral anticoagulation. Side effects: Symptoms, respiratory discussed the patient.  3. OSA: CPAP at night   4. Mild COPD exacerbation without wheezing currently:  This was treated.  5. Poorly controlled Diabetes: For now he will continue his outpatient regimen. He will discuss further medical management with his primary care physician including the need for insulin. Hemoglobin A1c 9.4  He will need close outpatient follow-up   He would benefit from insulin however he will discuss this with his primary care physician.  6. Essential hypertension: Continue losartan , Norvasc coreg 7. Morbid obesity:  Encouraged weight loss tolerated   8. NSTEMIPatient underwent cardiac catheterization which shows   Ost 1st Sept lesion is 70% stenosed.  Ost 2nd Mrg to 2nd Mrg lesion is 50% stenosed.  Mid RCA lesion is 100% stenosed.   1.  One-vessel CAD with occluded mid RCA with contra-collaterals 2.  Normal left ventricular function  Recommendations are for medical management Continue aspirin, Coreg, statin LDL 101 which is not at goal He will have outpatient follow-up with cardiology He is referred to cardiac rehab at discharge.    DISCHARGE CONDITIONS AND DIET:   Stable Diabetic heart healthy diet  CONSULTS OBTAINED:  Treatment Team:  Isaias Cowman, MD  DRUG ALLERGIES:   Allergies  Allergen Reactions  . Red Dye Anaphylaxis    "not as allergic to it as I used to be. I can take a little of it"    DISCHARGE MEDICATIONS:   Allergies as of 10/23/2017      Reactions   Red Dye Anaphylaxis   "not as allergic to it as I used to be. I can take a little of it"      Medication List    STOP taking these medications   doxycycline 100 MG capsule Commonly known as:  VIBRAMYCIN   metoprolol 200 MG 24 hr tablet Commonly known as:  TOPROL-XL Replaced by:  carvedilol 25 MG tablet   predniSONE 20 MG tablet Commonly known as:  DELTASONE     TAKE these medications   albuterol 108 (90 Base) MCG/ACT inhaler Commonly known as:  PROVENTIL HFA;VENTOLIN HFA Inhale 1-2 puffs into the lungs every 6 (six) hours as needed for wheezing or shortness of breath.  amLODipine 10 MG tablet Commonly known as:  NORVASC Take 10 mg by mouth daily.   apixaban 5 MG Tabs tablet Commonly known as:  ELIQUIS Take 1 tablet (5 mg total) by mouth 2 (two) times daily. Start taking on:  10/24/2017   aspirin 81 MG EC tablet Take 1 tablet (81 mg total) by mouth daily.   atorvastatin 40 MG tablet Commonly known as:  LIPITOR Take 1 tablet (40 mg total) by mouth daily at 6 PM.   benzonatate 100 MG  capsule Commonly known as:  TESSALON Take 100-200 mg by mouth 3 (three) times daily as needed for cough.   budesonide-formoterol 160-4.5 MCG/ACT inhaler Commonly known as:  SYMBICORT Inhale 2 puffs into the lungs 2 (two) times daily.   carvedilol 25 MG tablet Commonly known as:  COREG Take 1 tablet (25 mg total) by mouth 2 (two) times daily with a meal. Replaces:  metoprolol 200 MG 24 hr tablet   cholecalciferol 1000 units tablet Commonly known as:  VITAMIN D Take 1,000 Units by mouth daily.   fluticasone 50 MCG/ACT nasal spray Commonly known as:  FLONASE Place 2 sprays into both nostrils daily.   glipiZIDE 10 MG 24 hr tablet Commonly known as:  GLUCOTROL XL Take 10 mg by mouth daily with breakfast.   hydrochlorothiazide 25 MG tablet Commonly known as:  HYDRODIURIL Take 1 tablet by mouth daily.   ipratropium 0.02 % nebulizer solution Commonly known as:  ATROVENT Take 0.5 mg by nebulization every 6 (six) hours as needed for wheezing or shortness of breath.   losartan 100 MG tablet Commonly known as:  COZAAR Take 100 mg by mouth daily.   metFORMIN 1000 MG tablet Commonly known as:  GLUCOPHAGE Take 1 tablet (1,000 mg total) by mouth 2 (two) times daily with a meal.   mometasone-formoterol 100-5 MCG/ACT Aero Commonly known as:  DULERA Inhale 2 puffs into the lungs 2 (two) times daily.   multivitamin Tabs tablet Take 1 tablet by mouth daily.   nitroGLYCERIN 0.4 MG SL tablet Commonly known as:  NITROSTAT Place 1 tablet (0.4 mg total) under the tongue every 5 (five) minutes as needed for chest pain.   potassium chloride 10 MEQ tablet Commonly known as:  K-DUR,KLOR-CON Take 1 tablet (10 mEq total) by mouth daily.   torsemide 20 MG tablet Commonly known as:  DEMADEX Take 2 tablets (40 mg total) by mouth daily. What changed:  how much to take         Today   CHIEF COMPLAINT:   Doing well this am   VITAL SIGNS:  Blood pressure 130/76, pulse 72,  temperature 97.6 F (36.4 C), temperature source Oral, resp. rate 14, height 5\' 9"  (1.753 m), weight (!) 139.9 kg (308 lb 6.4 oz), SpO2 92 %.   REVIEW OF SYSTEMS:  Review of Systems  Constitutional: Negative.  Negative for chills, fever and malaise/fatigue.  HENT: Negative.  Negative for ear discharge, ear pain, hearing loss, nosebleeds and sore throat.   Eyes: Negative.  Negative for blurred vision and pain.  Respiratory: Negative.  Negative for cough, hemoptysis, shortness of breath and wheezing.   Cardiovascular: Negative.  Negative for chest pain, palpitations and leg swelling.  Gastrointestinal: Negative.  Negative for abdominal pain, blood in stool, diarrhea, nausea and vomiting.  Genitourinary: Negative.  Negative for dysuria.  Musculoskeletal: Negative.  Negative for back pain.  Skin: Negative.   Neurological: Negative for dizziness, tremors, speech change, focal weakness, seizures and headaches.  Endo/Heme/Allergies: Negative.  Does not bruise/bleed easily.  Psychiatric/Behavioral: Negative.  Negative for depression, hallucinations and suicidal ideas.     PHYSICAL EXAMINATION:  GENERAL:  67 y.o.-year-old patient lying in the bed with no acute distress. Morbidly obese NECK:  Supple, no jugular venous distention. No thyroid enlargement, no tenderness.  LUNGS: Normal breath sounds bilaterally, no wheezing, rales,rhonchi  No use of accessory muscles of respiration.  CARDIOVASCULAR: S1, S2 normal. No murmurs, rubs, or gallops.  ABDOMEN: Soft, non-tender, non-distended. Bowel sounds present. No organomegaly or mass.  EXTREMITIES: 1+ pedal edema, no cyanosis, or clubbing.  PSYCHIATRIC: The patient is alert and oriented x 3.  SKIN: No obvious rash, lesion, or ulcer.   DATA REVIEW:   CBC Recent Labs  Lab 10/23/17 0456  WBC 13.3*  HGB 13.4  HCT 40.8  PLT 253    Chemistries  Recent Labs  Lab 10/20/17 0226  10/23/17 0456  NA 136   < > 136  K 3.7   < > 3.6  CL 95*   < >  98*  CO2 22   < > 26  GLUCOSE 258*   < > 243*  BUN 30*   < > 36*  CREATININE 1.62*   < > 1.54*  CALCIUM 9.0   < > 8.9  AST 104*  --   --   ALT 62  --   --   ALKPHOS 89  --   --   BILITOT 0.3  --   --    < > = values in this interval not displayed.    Cardiac Enzymes Recent Labs  Lab 10/20/17 2215 10/21/17 0409 10/21/17 0801  TROPONINI 2.04* 1.28* 1.04*    Microbiology Results  @MICRORSLT48 @  RADIOLOGY:  No results found.    Allergies as of 10/23/2017      Reactions   Red Dye Anaphylaxis   "not as allergic to it as I used to be. I can take a little of it"      Medication List    STOP taking these medications   doxycycline 100 MG capsule Commonly known as:  VIBRAMYCIN   metoprolol 200 MG 24 hr tablet Commonly known as:  TOPROL-XL Replaced by:  carvedilol 25 MG tablet   predniSONE 20 MG tablet Commonly known as:  DELTASONE     TAKE these medications   albuterol 108 (90 Base) MCG/ACT inhaler Commonly known as:  PROVENTIL HFA;VENTOLIN HFA Inhale 1-2 puffs into the lungs every 6 (six) hours as needed for wheezing or shortness of breath.   amLODipine 10 MG tablet Commonly known as:  NORVASC Take 10 mg by mouth daily.   apixaban 5 MG Tabs tablet Commonly known as:  ELIQUIS Take 1 tablet (5 mg total) by mouth 2 (two) times daily. Start taking on:  10/24/2017   aspirin 81 MG EC tablet Take 1 tablet (81 mg total) by mouth daily.   atorvastatin 40 MG tablet Commonly known as:  LIPITOR Take 1 tablet (40 mg total) by mouth daily at 6 PM.   benzonatate 100 MG capsule Commonly known as:  TESSALON Take 100-200 mg by mouth 3 (three) times daily as needed for cough.   budesonide-formoterol 160-4.5 MCG/ACT inhaler Commonly known as:  SYMBICORT Inhale 2 puffs into the lungs 2 (two) times daily.   carvedilol 25 MG tablet Commonly known as:  COREG Take 1 tablet (25 mg total) by mouth 2 (two) times daily with a meal. Replaces:  metoprolol 200 MG 24 hr tablet    cholecalciferol  1000 units tablet Commonly known as:  VITAMIN D Take 1,000 Units by mouth daily.   fluticasone 50 MCG/ACT nasal spray Commonly known as:  FLONASE Place 2 sprays into both nostrils daily.   glipiZIDE 10 MG 24 hr tablet Commonly known as:  GLUCOTROL XL Take 10 mg by mouth daily with breakfast.   hydrochlorothiazide 25 MG tablet Commonly known as:  HYDRODIURIL Take 1 tablet by mouth daily.   ipratropium 0.02 % nebulizer solution Commonly known as:  ATROVENT Take 0.5 mg by nebulization every 6 (six) hours as needed for wheezing or shortness of breath.   losartan 100 MG tablet Commonly known as:  COZAAR Take 100 mg by mouth daily.   metFORMIN 1000 MG tablet Commonly known as:  GLUCOPHAGE Take 1 tablet (1,000 mg total) by mouth 2 (two) times daily with a meal.   mometasone-formoterol 100-5 MCG/ACT Aero Commonly known as:  DULERA Inhale 2 puffs into the lungs 2 (two) times daily.   multivitamin Tabs tablet Take 1 tablet by mouth daily.   nitroGLYCERIN 0.4 MG SL tablet Commonly known as:  NITROSTAT Place 1 tablet (0.4 mg total) under the tongue every 5 (five) minutes as needed for chest pain.   potassium chloride 10 MEQ tablet Commonly known as:  K-DUR,KLOR-CON Take 1 tablet (10 mEq total) by mouth daily.   torsemide 20 MG tablet Commonly known as:  DEMADEX Take 2 tablets (40 mg total) by mouth daily. What changed:  how much to take          Management plans discussed with the patient and he is in agreement. Stable for discharge   Patient should follow up with cardiology  CODE STATUS:     Code Status Orders  (From admission, onward)        Start     Ordered   10/20/17 1215  Full code  Continuous     10/20/17 1214    Code Status History    Date Active Date Inactive Code Status Order ID Comments User Context   07/27/2016 15:15 07/29/2016 16:01 Full Code [id number]  Max Sane, MD ED      TOTAL TIME TAKING CARE OF THIS PATIENT: 38  minutes.    Note: This dictation was prepared with Dragon dictation along with smaller phrase technology. Any transcriptional errors that result from this process are unintentional.  Kross Swallows M.D on 10/23/2017 at 10:42 AM  Between 7am to 6pm - Pager - [phone] After 6pm go to [email or web address] - password [email or web address]  Office  [phone]  CC: Primary care physician; [name], MD

## 2017-10-23 NOTE — Progress Notes (Addendum)
Inpatient Diabetes Program Recommendations  AACE/ADA: New Consensus Statement on Inpatient Glycemic Control (2015)  Target Ranges:  Prepandial:   less than 140 mg/dL      Peak postprandial:   less than 180 mg/dL (1-2 hours)      Critically ill patients:  140 - 180 mg/dL  Results for KRISHAN, MCBREEN (MRN [id number]) as of 10/23/2017 08:10  Ref. Range 10/22/2017 07:39 10/22/2017 12:07 10/22/2017 16:32 10/22/2017 20:19 10/23/2017 07:47  Glucose-Capillary Latest Ref Range: 65 - 99 mg/dL 194 (H) 324 (H) 334 (H) 339 (H) 190 (H)  Results for CHRISTIAN, BORGERDING (MRN [id number]) as of 10/23/2017 08:10  Ref. Range 10/21/2017 04:08  Hemoglobin A1C Latest Ref Range: 4.8 - 5.6 % 9.4 (H)   Review of Glycemic Control  Diabetes history: DM2 Outpatient Diabetes medications: Glipizide XL 10 mg QAM, Metformin 1000 mg BID, Levemir 80-90 units daily Current orders for Inpatient glycemic control: Lantus 14 units QHS, Novolog 0-20 units TID with meals, Novolog 0-5 units QHS; Prednisone 40 mg QAM  Inpatient Diabetes Program Recommendations: Insulin - Basal: Please consider increasing Lantus to 17 units QHS. Insulin - Meal Coverage: Once diet is reordered, please consider ordering Novolog 10 units TID with meals for meal coverage if patient eats at least 50% of meals. HgbA1C: A1C 9.4% on 10/21/17 indicating an average glucose of 223 mg/dl over the past 2-3 months. Recommend MD adjust outpatient DM medications at time of discharge; may need to add basal insulin. Outpatient:  Recommend patient be referred to an Endocrinologist to assist with improve DM control.  Addendum 10/23/17@13 :40-Spoke with patient about diabetes and home regimen for diabetes control. Patient reports that he is followed by PCP for diabetes management and currently he takes Levemir 80-90 units daily, Glipizide XL 10 mg QAM, and Metformin 1000 mg BID as an outpatient for diabetes control. Patient reports that his PCP started him on Levemir about 4 months  ago; he was taking 50-60 units but is now up to 80-90 units daily. Patient states that his glucose has been "out of control" for the past 4-6 months so he has been seeing his PCP every month.  Patient states that he checks his glucose and he has been keeping a spread sheet of his glucose which he is taking to monthly follow up visits.  Inquired about prior A1C and patient reports that he does not recall his last A1C value. Discussed A1C results (9.4% on 10/21/17) and explained that his current A1C indicates an average glucose of 223 mg/dl over the past 2-3 months. Discussed glucose and A1C goals. Discussed importance of checking CBGs and maintaining good CBG control to prevent long-term and short-term complications. Explained how hyperglycemia leads to damage within blood vessels which lead to the common complications seen with uncontrolled diabetes. Stressed to the patient the importance of improving glycemic control to prevent further complications from uncontrolled diabetes. Discussed impact of nutrition, exercise, stress, sickness, and medications on diabetes control. Patient admits that he does not always follow a carb modified diet. Patient admits that he drinks regular sprite and sweet tea. Encouraged patient to eliminate sweetened beverages from diet to help improve DM control. Patient also reports that he has been on steroids over the past month which is contributing to elevated A1C.  Encouraged patient to check his glucose 3-4 times per day (before meals and at bedtime) and to continue documenting on his spread sheet.  Discussed FreeStyle Libre (flash glucose monitoring sensor) and how it could provide more data for his  doctor to use to make adjustments with DM medications. Encouraged patient to ask his PCP about prescribing FreeStyle Elenor Legato if he is interested. Encouraged patient to talk with PCP about being referred to an Endocrinologist to assist with improve DM control.  Patient verbalized understanding  of information discussed and he states that he has no further questions at this time related to diabetes.  Thanks, Barnie Alderman, RN, MSN, CDE Diabetes Coordinator Inpatient Diabetes Program [phone] (Team Pager from 8am to 5pm)

## 2017-10-23 NOTE — OR Nursing (Signed)
Noted strong cough, coughs to clear but when sat on side of bed developed wheezing. Breathing treatment Duoneb and tussinex given. (pt reports despite red dye allergy he regularly uses Tussinex at home without allergic reaction.)

## 2017-10-23 NOTE — Care Management (Signed)
Informed that patient to discharge home on eliquis which will be a new medication for patient.  Verbally confirms pharmacy coverage with his insurance plan.  Provided 30 day trial coupon and copay assist coupon.

## 2017-10-23 NOTE — Progress Notes (Signed)
[facility] for heparin Indication: atrial fibrillation  Allergies  Allergen Reactions  . Red Dye Anaphylaxis    "not as allergic to it as I used to be. I can take a little of it"    Patient Measurements: Height: 5\' 9"  (175.3 cm) Weight: (!) 308 lb 6.4 oz (139.9 kg) IBW/kg (Calculated) : 70.7 Heparin Dosing Weight: 102.7kg  Vital Signs: Temp: 97.9 F (36.6 C) (03/04 0422) Temp Source: Oral (03/04 0422) BP: 139/68 (03/04 0422) Pulse Rate: 75 (03/04 0422)  Labs: Recent Labs    10/20/17 0744  10/20/17 2215  10/21/17 0409 10/21/17 0417 10/21/17 0801 10/22/17 0442 10/23/17 0456  HGB  --   --   --    < > 13.3  --   --  13.6 13.4  HCT  --   --   --   --  40.2  --   --  41.3 40.8  PLT  --   --   --   --  259  --   --  254 253  APTT 34  --   --   --   --   --   --   --   --   LABPROT 12.8  --   --   --   --   --   --   --   --   INR 0.97  --   --   --   --   --   --   --   --   HEPARINUNFRC  --    < > 0.35  --   --  0.44  --  0.33 0.28*  CREATININE  --   --   --   --  1.56*  --   --  1.44*  --   TROPONINI 1.67*   < > 2.04*  --  1.28*  --  1.04*  --   --    < > = values in this interval not displayed.    Estimated Creatinine Clearance: 71.2 mL/min (A) (by C-G formula based on SCr of 1.44 mg/dL (H)).   Medical History: Past Medical History:  Diagnosis Date  . CHF (congestive heart failure) ([place])   . COPD (chronic obstructive pulmonary disease) ([place])   . Diabetes mellitus without complication ([place])   . Hypertension     Medications:  Scheduled:  . amLODipine  10 mg Oral Daily  . aspirin EC  81 mg Oral Daily  . atorvastatin  40 mg Oral q1800  . carvedilol  25 mg Oral BID WC  . dextromethorphan  15 mg Oral Q12H  . doxycycline  100 mg Oral Q12H  . fluticasone  2 spray Each Nare BID  . furosemide  80 mg Intravenous BID  . heparin  1,400 Units Intravenous Once  . insulin aspart  0-20 Units Subcutaneous TID WC  . insulin aspart  0-5  Units Subcutaneous QHS  . insulin glargine  14 Units Subcutaneous QHS  . loratadine  10 mg Oral Daily  . losartan  100 mg Oral Daily  . mometasone-formoterol  2 puff Inhalation BID  . potassium chloride  20 mEq Oral BID  . predniSONE  40 mg Oral Q breakfast  . sodium chloride flush  3 mL Intravenous Q12H  . sodium chloride flush  3 mL Intravenous Q12H    Assessment: Patient admitted for SOB found to have trop of 0.03 and has afib. No PTA anticoagulation. Starting patient on heparin drip  for anticoagulation for afib.  Goal of Therapy:  Heparin level 0.3-0.7 units/ml Monitor platelets by anticoagulation protocol: Yes   Plan:  HL = 0.15, subtherapeutic. Will give 3000 unit heparin bolus and increase rate to 1500 units/hr. Will obtain follow up level 6 hours after rate change.  03/01 @ 2200 HL 0.35 therapeutic, Will continue current rate and recheck HL @ 0500.  03/02 @ 0500 HL 0.44 therapeutic. Will continue current rate and will recheck HL w/ am labs. CBC stable.   03/03 @ 0500 HL 0.33 therapeutic. Will continue current rate and will recheck HL w/ am labs. CBC stable.  03/04 @ 0500 HL 0.28 subtherapeutic. Will rebolus w/ heparin 1400 units IV x 1 and increase rate to 1650 units/hr and recheck HL @ 1100. CBC stable.  Tobie Lords, PharmD, BCPS Clinical Pharmacist 10/23/2017

## 2017-10-23 NOTE — Consult Note (Signed)
Provided patient with "Living Better with Heart Failure" packet. Briefly reviewed definition of heart failure and signs and symptoms of an exacerbation. Reviewed importance of and reason behind checking weight daily in the AM, after using the bathroom, but before getting dressed. Discussed when to call the Dr= weight gain of >2lb overnight of 5lb in a week,  Discussed yellow zone= call MD: weight gain of >2lb overnight of 5lb in a week, increased swelling, increased SOB when lying down, chest discomfort, dizziness, increased fatigue Red Zone= call 911: struggle to breath, fainting or near fainting, significant chest pain Reviewed low sodium diet <2g/day-reviewed reading Nutrition facts Fluid restriction <2L/day Reviewed how to read nutrition label Reviewed medication changes: addition of apixaban, ASA- change from metoprolol to carvedilol, increased dose to torsemide to 40mg  daily Pt educated about s/sx of bleeding, hypotension Pt educated about the Heart failure clinic its purpose and the appointment time and date  Ramond Dial, Pharm.D, BCPS Clinical Pharmacist

## 2017-10-30 ENCOUNTER — Ambulatory Visit: Payer: Managed Care, Other (non HMO) | Admitting: Family

## 2017-10-30 ENCOUNTER — Telehealth: Payer: Self-pay

## 2017-10-30 NOTE — Telephone Encounter (Signed)
Flagged on EMMI report for having other questions/problems.  Called and spoke with patient.  He had a question regarding why he was told to stop taking levemir when he left the hospital without something to replace it.  He stated he has been taking insulin on a regular basis for a while now and that what he was given at the hospital was on a sliding scale, different than what he normally takes.  He reported he has continued to take insulin after his discharge as he usually does because he's needed it.  Patient reported he has an appointment with Dr. Clayborn Bigness 3/11 at 2:45pm and with his PCP, Dr. Derenda Mis, tomorrow.  Encouraged him to mention this to his providers for clarification.  Also encouraged him to ask them about how long the recovery period for heart attacks usually is as he mentioned he still wasn't feeling like his usual self.  No further concerns or questions at this time.

## 2017-11-05 ENCOUNTER — Encounter: Payer: Self-pay | Admitting: Emergency Medicine

## 2017-11-05 ENCOUNTER — Inpatient Hospital Stay
Admission: EM | Admit: 2017-11-05 | Discharge: 2017-11-07 | DRG: 378 | Disposition: A | Discharge status: Home / Self Care | Payer: Managed Care, Other (non HMO) | Attending: Internal Medicine | Admitting: Internal Medicine

## 2017-11-05 ENCOUNTER — Other Ambulatory Visit: Payer: Self-pay

## 2017-11-05 DIAGNOSIS — I480 Paroxysmal atrial fibrillation: Secondary | ICD-10-CM | POA: Diagnosis present

## 2017-11-05 DIAGNOSIS — Z794 Long term (current) use of insulin: Secondary | ICD-10-CM

## 2017-11-05 DIAGNOSIS — D120 Benign neoplasm of cecum: Secondary | ICD-10-CM | POA: Diagnosis present

## 2017-11-05 DIAGNOSIS — Z6841 Body Mass Index (BMI) 40.0 and over, adult: Secondary | ICD-10-CM | POA: Diagnosis not present

## 2017-11-05 DIAGNOSIS — Z7982 Long term (current) use of aspirin: Secondary | ICD-10-CM | POA: Diagnosis not present

## 2017-11-05 DIAGNOSIS — K5730 Diverticulosis of large intestine without perforation or abscess without bleeding: Secondary | ICD-10-CM | POA: Diagnosis present

## 2017-11-05 DIAGNOSIS — D122 Benign neoplasm of ascending colon: Secondary | ICD-10-CM | POA: Diagnosis present

## 2017-11-05 DIAGNOSIS — K922 Gastrointestinal hemorrhage, unspecified: Secondary | ICD-10-CM

## 2017-11-05 DIAGNOSIS — K259 Gastric ulcer, unspecified as acute or chronic, without hemorrhage or perforation: Secondary | ICD-10-CM | POA: Diagnosis not present

## 2017-11-05 DIAGNOSIS — K254 Chronic or unspecified gastric ulcer with hemorrhage: Secondary | ICD-10-CM | POA: Diagnosis not present

## 2017-11-05 DIAGNOSIS — J449 Chronic obstructive pulmonary disease, unspecified: Secondary | ICD-10-CM | POA: Diagnosis present

## 2017-11-05 DIAGNOSIS — K921 Melena: Secondary | ICD-10-CM | POA: Diagnosis present

## 2017-11-05 DIAGNOSIS — Z79899 Other long term (current) drug therapy: Secondary | ICD-10-CM | POA: Diagnosis not present

## 2017-11-05 DIAGNOSIS — I5032 Chronic diastolic (congestive) heart failure: Secondary | ICD-10-CM | POA: Diagnosis present

## 2017-11-05 DIAGNOSIS — Z791 Long term (current) use of non-steroidal anti-inflammatories (NSAID): Secondary | ICD-10-CM

## 2017-11-05 DIAGNOSIS — N183 Chronic kidney disease, stage 3 (moderate): Secondary | ICD-10-CM | POA: Diagnosis present

## 2017-11-05 DIAGNOSIS — I2510 Atherosclerotic heart disease of native coronary artery without angina pectoris: Secondary | ICD-10-CM | POA: Diagnosis present

## 2017-11-05 DIAGNOSIS — Z23 Encounter for immunization: Secondary | ICD-10-CM | POA: Diagnosis not present

## 2017-11-05 DIAGNOSIS — E1165 Type 2 diabetes mellitus with hyperglycemia: Secondary | ICD-10-CM | POA: Diagnosis present

## 2017-11-05 DIAGNOSIS — K649 Unspecified hemorrhoids: Secondary | ICD-10-CM | POA: Diagnosis present

## 2017-11-05 DIAGNOSIS — E1122 Type 2 diabetes mellitus with diabetic chronic kidney disease: Secondary | ICD-10-CM | POA: Diagnosis present

## 2017-11-05 DIAGNOSIS — I130 Hypertensive heart and chronic kidney disease with heart failure and stage 1 through stage 4 chronic kidney disease, or unspecified chronic kidney disease: Secondary | ICD-10-CM | POA: Diagnosis present

## 2017-11-05 HISTORY — DX: Melena: K92.1

## 2017-11-05 LAB — COMPREHENSIVE METABOLIC PANEL
ALT: 38 U/L (ref 17–63)
AST: 40 U/L (ref 15–41)
Albumin: 3.3 g/dL — ABNORMAL LOW (ref 3.5–5.0)
Alkaline Phosphatase: 67 U/L (ref 38–126)
Anion gap: 11 (ref 5–15)
BUN: 22 mg/dL — ABNORMAL HIGH (ref 6–20)
CHLORIDE: 92 mmol/L — AB (ref 101–111)
CO2: 34 mmol/L — ABNORMAL HIGH (ref 22–32)
CREATININE: 1.59 mg/dL — AB (ref 0.61–1.24)
Calcium: 9.3 mg/dL (ref 8.9–10.3)
GFR calc non Af Amer: 44 mL/min — ABNORMAL LOW (ref >60)
GFR, EST AFRICAN AMERICAN: 51 mL/min — AB (ref >60)
Glucose, Bld: 232 mg/dL — ABNORMAL HIGH (ref 65–99)
POTASSIUM: 3.4 mmol/L — AB (ref 3.5–5.1)
SODIUM: 137 mmol/L (ref 135–145)
Total Bilirubin: 0.4 mg/dL (ref 0.3–1.2)
Total Protein: 7.6 g/dL (ref 6.5–8.1)

## 2017-11-05 LAB — CBC
HEMATOCRIT: 38.7 % — AB (ref 40.0–52.0)
Hemoglobin: 12.6 g/dL — ABNORMAL LOW (ref 13.0–18.0)
MCH: 29.9 pg (ref 26.0–34.0)
MCHC: 32.5 g/dL (ref 32.0–36.0)
MCV: 92 fL (ref 80.0–100.0)
PLATELETS: 272 10*3/uL (ref 150–440)
RBC: 4.2 MIL/uL — AB (ref 4.40–5.90)
RDW: 14.5 % (ref 11.5–14.5)
WBC: 13.3 10*3/uL — ABNORMAL HIGH (ref 3.8–10.6)

## 2017-11-05 LAB — TYPE AND SCREEN
ABO/RH(D): O NEG
Antibody Screen: NEGATIVE

## 2017-11-05 MED ORDER — IPRATROPIUM-ALBUTEROL 0.5-2.5 (3) MG/3ML IN SOLN
3.0000 mL | Freq: Four times a day (QID) | RESPIRATORY_TRACT | Status: DC | PRN
  Administered 2017-11-07: 3 mL via RESPIRATORY_TRACT
  Filled 2017-11-05: qty 3

## 2017-11-05 NOTE — ED Notes (Signed)
Per 1C, AC going to switch pt to telemetry unit.

## 2017-11-05 NOTE — ED Triage Notes (Signed)
Pt arrived via POV from home with reports of bloody stools for the past 1-2 weeks, pt states he was here on 3/1 and discharged on 3/4 on Eliquis, pt states he stopped the eliquis last night when he realized he was having blood stools not related to hemorrhoids.  Pt states he has bright red blood and black stools.  Pt states he tried hemorrhoid cream with no relief.

## 2017-11-05 NOTE — ED Provider Notes (Signed)
[facility] Emergency Department Provider Note    None    (approximate)  I have reviewed the triage vital signs and the nursing notes.   HISTORY  Chief Complaint Rectal Bleeding    HPI Jason Hudson is a 67 y.o. male recent admission for COPD exacerbation status post cardiac cath STEMI with no stent but also placed on Eliquis for paroxysmal A. fib presents to the ER for several days of initially melena alternating with bright red blood per rectum.  Patient stopped his Eliquis yesterday and feels that has tapered off but is still having dark stools.  Does have a history of hemorrhoids.  Does have a history of peptic ulcer.  Denies any abdominal pain at this time.  No chest pain or shortness of breath.  No near syncopal episodes.  Past Medical History:  Diagnosis Date  . CHF (congestive heart failure) ([place])   . COPD (chronic obstructive pulmonary disease) ([place])   . Diabetes mellitus without complication ([place])   . Hypertension    History reviewed. No pertinent family history. Past Surgical History:  Procedure Laterality Date  . LEFT HEART CATH AND CORONARY ANGIOGRAPHY N/A 10/23/2017   Procedure: LEFT HEART CATH AND CORONARY ANGIOGRAPHY;  Surgeon: Isaias Cowman, MD;  Location: [facility];  Service: Cardiovascular;  Laterality: N/A;   Patient Active Problem List   Diagnosis Date Noted  . Melena 11/05/2017  . CHF (congestive heart failure) ([place]) 10/20/2017  . Pneumonia 07/27/2016      Prior to Admission medications   Medication Sig Start Date End Date Taking? Authorizing Provider  albuterol (PROVENTIL HFA;VENTOLIN HFA) 108 (90 Base) MCG/ACT inhaler Inhale 1-2 puffs into the lungs every 6 (six) hours as needed for wheezing or shortness of breath.   Yes [provider]  amLODipine (NORVASC) 10 MG tablet Take 10 mg by mouth daily.   Yes [provider]  aspirin EC 81 MG EC tablet Take 1 tablet (81 mg total) by  mouth daily. 10/23/17  Yes Mody, Ulice Bold, MD  budesonide-formoterol (SYMBICORT) 160-4.5 MCG/ACT inhaler Inhale 2 puffs into the lungs 2 (two) times daily.   Yes [provider]  carvedilol (COREG) 25 MG tablet Take 1 tablet (25 mg total) by mouth 2 (two) times daily with a meal. Patient taking differently: Take 12.5 mg by mouth 2 (two) times daily with a meal.  10/23/17  Yes Mody, Sital, MD  cholecalciferol (VITAMIN D) 1000 units tablet Take 1,000 Units by mouth daily.   Yes [provider]  fluticasone (FLONASE) 50 MCG/ACT nasal spray Place 2 sprays into both nostrils daily.   Yes [provider]  glipiZIDE (GLUCOTROL XL) 10 MG 24 hr tablet Take 10 mg by mouth daily with breakfast.   Yes [provider]  hydrochlorothiazide (HYDRODIURIL) 12.5 MG tablet Take 1 tablet by mouth daily.   Yes [provider]  insulin detemir (LEVEMIR) 100 UNIT/ML injection Inject 80 Units into the skin 2 (two) times daily.   Yes [provider]  ipratropium (ATROVENT) 0.02 % nebulizer solution Take 0.5 mg by nebulization every 6 (six) hours as needed for wheezing or shortness of breath.   Yes [provider]  labetalol (NORMODYNE) 100 MG tablet Take 100 mg by mouth 2 (two) times daily.   Yes [provider]  loratadine (CLARITIN) 10 MG tablet Take 10 mg by mouth daily.   Yes [provider]  losartan (COZAAR) 100 MG tablet Take 100 mg by mouth daily.  Yes [provider]  metFORMIN (GLUCOPHAGE) 1000 MG tablet Take 1 tablet (1,000 mg total) by mouth 2 (two) times daily with a meal. 07/29/16  Yes [name], MD  mometasone-formoterol (DULERA) 100-5 MCG/ACT AERO Inhale 2 puffs into the lungs 2 (two) times daily.   Yes [provider]  multivitamin (ONE-A-DAY MEN'S) TABS tablet Take 1 tablet by mouth daily.   Yes [provider]  nitroGLYCERIN (NITROSTAT) 0.4 MG SL tablet Place 1 tablet (0.4 mg total) under the tongue  every 5 (five) minutes as needed for chest pain. 10/23/17  Yes Mody, Ulice Bold, MD  potassium chloride (K-DUR,KLOR-CON) 10 MEQ tablet Take 1 tablet (10 mEq total) by mouth daily. 10/23/17  Yes Mody, Ulice Bold, MD  torsemide (DEMADEX) 20 MG tablet Take 2 tablets (40 mg total) by mouth daily. 10/23/17  Yes Mody, Ulice Bold, MD  apixaban (ELIQUIS) 5 MG TABS tablet Take 1 tablet (5 mg total) by mouth 2 (two) times daily. Patient not taking: Reported on 11/05/2017 10/24/17   Bettey Costa, MD  atorvastatin (LIPITOR) 40 MG tablet Take 1 tablet (40 mg total) by mouth daily at 6 PM. Patient not taking: Reported on 11/05/2017 10/23/17   Bettey Costa, MD  benzonatate (TESSALON) 100 MG capsule Take 100-200 mg by mouth 3 (three) times daily as needed for cough.    [provider]    Allergies Red dye    Social History Social History   Tobacco Use  . Smoking status: Never Smoker  . Smokeless tobacco: Never Used  Substance Use Topics  . Alcohol use: No  . Drug use: Not on file    Review of Systems Patient denies headaches, rhinorrhea, blurry vision, numbness, shortness of breath, chest pain, edema, cough, abdominal pain, nausea, vomiting, diarrhea, dysuria, fevers, rashes or hallucinations unless otherwise stated above in HPI. ____________________________________________   PHYSICAL EXAM:  VITAL SIGNS: Vitals:   11/05/17 2300 11/06/17 0011  BP: (!) 151/76 (!) 160/79  Pulse:  86  Resp: 15 18  Temp:  98.1 F (36.7 C)  SpO2:  97%    Constitutional: Alert and oriented. Well appearing and in no acute distress. Eyes: Conjunctivae are normal.  Head: Atraumatic. Nose: No congestion/rhinnorhea. Mouth/Throat: Mucous membranes are moist.   Neck: No stridor. Painless ROM.  Cardiovascular: Normal rate, regular rhythm. Grossly normal heart sounds.  Good peripheral circulation. Respiratory: Normal respiratory effort.  No retractions. Lungs CTAB. Gastrointestinal: Soft and nontender. No distention. No abdominal  bruits. No CVA tenderness. Genitourinary: Dark stools that are guaiac positive Musculoskeletal: No lower extremity tenderness nor edema.  No joint effusions. Neurologic:  Normal speech and language. No gross focal neurologic deficits are appreciated. No facial droop Skin:  Skin is warm, dry and intact. No rash noted. Psychiatric: Mood and affect are normal. Speech and behavior are normal.  ____________________________________________   LABS (all labs ordered are listed, but only abnormal results are displayed)  Results for orders placed or performed during the hospital encounter of 11/05/17 (from the past 24 hour(s))  Comprehensive metabolic panel     Status: Abnormal   Collection Time: 11/05/17  5:35 PM  Result Value Ref Range   Sodium 137 135 - 145 mmol/L   Potassium 3.4 (L) 3.5 - 5.1 mmol/L   Chloride 92 (L) 101 - 111 mmol/L   CO2 34 (H) 22 - 32 mmol/L   Glucose, Bld 232 (H) 65 - 99 mg/dL   BUN 22 (H) 6 - 20 mg/dL   Creatinine, Ser 1.59 (H) 0.61 -  1.24 mg/dL   Calcium 9.3 8.9 - 10.3 mg/dL   Total Protein 7.6 6.5 - 8.1 g/dL   Albumin 3.3 (L) 3.5 - 5.0 g/dL   AST 40 15 - 41 U/L   ALT 38 17 - 63 U/L   Alkaline Phosphatase 67 38 - 126 U/L   Total Bilirubin 0.4 0.3 - 1.2 mg/dL   GFR calc non Af Amer 44 (L) >60 mL/min   GFR calc Af Amer 51 (L) >60 mL/min   Anion gap 11 5 - 15  CBC     Status: Abnormal   Collection Time: 11/05/17  5:35 PM  Result Value Ref Range   WBC 13.3 (H) 3.8 - 10.6 K/uL   RBC 4.20 (L) 4.40 - 5.90 MIL/uL   Hemoglobin 12.6 (L) 13.0 - 18.0 g/dL   HCT 38.7 (L) 40.0 - 52.0 %   MCV 92.0 80.0 - 100.0 fL   MCH 29.9 26.0 - 34.0 pg   MCHC 32.5 32.0 - 36.0 g/dL   RDW 14.5 11.5 - 14.5 %   Platelets 272 150 - 440 K/uL  Type and screen [place]     Status: None   Collection Time: 11/05/17  5:35 PM  Result Value Ref Range   ABO/RH(D) O NEG    Antibody Screen NEG    Sample Expiration      11/08/2017 Performed at [facility],  [address], [place], [place] [address]    ____________________________________________  EKG My review and personal interpretation at Time: 21:31   Indication: heart disease  Rate: 85  Rhythm: sinus Axis: normal Other: poor r wave progression ____________________________________________  RADIOLOGY   ____________________________________________   PROCEDURES  Procedure(s) performed:  Procedures    Critical Care performed: no ____________________________________________   INITIAL IMPRESSION / ASSESSMENT AND PLAN / ED COURSE  Pertinent labs & imaging results that were available during my care of the patient were reviewed by me and considered in my medical decision making (see chart for details).  DDX: GI bleed, diverticular bleed, peptic ulcer, dehydration  Majesty Oehlert is a 67 y.o. who presents to the ED with was as described above.  Patient with evidence of GI bleed on Eliquis.  Hemoglobin with 1 g drop.  Not consistent with massive GI bleed.  Does not require transfusion or infusion at this time but do feel patient would benefit from observation for serial hemoglobins and consultation was he needs to be on Eliquis or not.      As part of my medical decision making, I reviewed the following data within the [facility] notes reviewed and incorporated, Labs reviewed, notes from prior ED visits    ____________________________________________   FINAL CLINICAL IMPRESSION(S) / ED DIAGNOSES  Final diagnoses:  Gastrointestinal hemorrhage, unspecified gastrointestinal hemorrhage type      NEW MEDICATIONS STARTED DURING THIS VISIT:  Current Discharge Medication List       Note:  This document was prepared using Dragon voice recognition software and may include unintentional dictation errors.    Merlyn Lot, MD 11/06/17 [phone]

## 2017-11-05 NOTE — H&P (Signed)
[facility] [place] NAME: Worthy Boschert    MR#:  [id number]  DATE OF BIRTH:  05-Apr-1951  DATE OF ADMISSION:  11/05/2017  PRIMARY CARE PHYSICIAN: [name], MD   REQUESTING/REFERRING PHYSICIAN:   CHIEF COMPLAINT:   Chief Complaint  Patient presents with  . Rectal Bleeding    HISTORY OF PRESENT ILLNESS: Mae Cianci  is a 67 y.o. male with a known history of CHF, COPD, diabetes type 2, hypertension, CAD and paroxysmal atrial fibrillation on Eliquis. Patient presented to emergency room for blood in stool noted in the past 3-4 days.  Patient describes mostly melena, but also episodes of bright red blood per rectum.  He denies any nausea or vomiting no abdominal pain.  No new medications except for Eliquis, that was started recently, due to paroxysmal atrial fibrillation. Upon evaluation in the emergency room, patient is noted to be in sinus rhythm.  EKG, reviewed by myself is noted without any acute changes.  Blood test done emergency room were notable for hemoglobin level at 12.6, which is slightly lower than patient's baseline, 13.7.  Creatinine is 1.59 this seems to be a baseline.  Potassium is 3.4.  WBCs 13.3.  Patient was on prednisone p.o. until yesterday, for an acute COPD exacerbation. Patient is admitted for further evaluation and treatment.  PAST MEDICAL HISTORY:   Past Medical History:  Diagnosis Date  . CHF (congestive heart failure) ([place])   . COPD (chronic obstructive pulmonary disease) ([place])   . Diabetes mellitus without complication ([place])   . Hypertension     PAST SURGICAL HISTORY:  Past Surgical History:  Procedure Laterality Date  . LEFT HEART CATH AND CORONARY ANGIOGRAPHY N/A 10/23/2017   Procedure: LEFT HEART CATH AND CORONARY ANGIOGRAPHY;  Surgeon: Isaias Cowman, MD;  Location: [facility];  Service: Cardiovascular;  Laterality: N/A;    SOCIAL HISTORY:  Social History   Tobacco Use  .  Smoking status: Never Smoker  . Smokeless tobacco: Never Used  Substance Use Topics  . Alcohol use: No    FAMILY HISTORY: No family history on file.  DRUG ALLERGIES:  Allergies  Allergen Reactions  . Red Dye Anaphylaxis    "not as allergic to it as I used to be. I can take a little of it"    REVIEW OF SYSTEMS:   CONSTITUTIONAL: No fever, fatigue or weakness.  EYES: No vision changes.  EARS, NOSE, AND THROAT: No tinnitus or ear pain.  RESPIRATORY: No cough, shortness of breath, wheezing or hemoptysis.  CARDIOVASCULAR: No chest pain, orthopnea, edema.  GASTROINTESTINAL: Positive for blood in stool.  No nausea, vomiting, or abdominal pain.  GENITOURINARY: No dysuria, hematuria.  ENDOCRINE: No polyuria, nocturia,  HEMATOLOGY: No anemia, easy bruising or bleeding SKIN: No rash or lesion. MUSCULOSKELETAL: Positive history of osteoarthritis.   NEUROLOGIC: No focal weakness.  PSYCHIATRY: No anxiety or depression.   MEDICATIONS AT HOME:  Prior to Admission medications   Medication Sig Start Date End Date Taking? Authorizing Provider  albuterol (PROVENTIL HFA;VENTOLIN HFA) 108 (90 Base) MCG/ACT inhaler Inhale 1-2 puffs into the lungs every 6 (six) hours as needed for wheezing or shortness of breath.   Yes [provider]  amLODipine (NORVASC) 10 MG tablet Take 10 mg by mouth daily.   Yes [provider]  aspirin EC 81 MG EC tablet Take 1 tablet (81 mg total) by mouth daily. 10/23/17  Yes Bettey Costa, MD  budesonide-formoterol (SYMBICORT) 160-4.5 MCG/ACT  inhaler Inhale 2 puffs into the lungs 2 (two) times daily.   Yes [provider]  carvedilol (COREG) 25 MG tablet Take 1 tablet (25 mg total) by mouth 2 (two) times daily with a meal. Patient taking differently: Take 12.5 mg by mouth 2 (two) times daily with a meal.  10/23/17  Yes Mody, Sital, MD  cholecalciferol (VITAMIN D) 1000 units tablet Take 1,000 Units by mouth daily.   Yes [provider]   fluticasone (FLONASE) 50 MCG/ACT nasal spray Place 2 sprays into both nostrils daily.   Yes [provider]  glipiZIDE (GLUCOTROL XL) 10 MG 24 hr tablet Take 10 mg by mouth daily with breakfast.   Yes [provider]  hydrochlorothiazide (HYDRODIURIL) 12.5 MG tablet Take 1 tablet by mouth daily.   Yes [provider]  insulin detemir (LEVEMIR) 100 UNIT/ML injection Inject 80 Units into the skin 2 (two) times daily.   Yes [provider]  ipratropium (ATROVENT) 0.02 % nebulizer solution Take 0.5 mg by nebulization every 6 (six) hours as needed for wheezing or shortness of breath.   Yes [provider]  labetalol (NORMODYNE) 100 MG tablet Take 100 mg by mouth 2 (two) times daily.   Yes [provider]  loratadine (CLARITIN) 10 MG tablet Take 10 mg by mouth daily.   Yes [provider]  losartan (COZAAR) 100 MG tablet Take 100 mg by mouth daily.   Yes [provider]  metFORMIN (GLUCOPHAGE) 1000 MG tablet Take 1 tablet (1,000 mg total) by mouth 2 (two) times daily with a meal. 07/29/16  Yes [name], MD  mometasone-formoterol (DULERA) 100-5 MCG/ACT AERO Inhale 2 puffs into the lungs 2 (two) times daily.   Yes [provider]  multivitamin (ONE-A-DAY MEN'S) TABS tablet Take 1 tablet by mouth daily.   Yes [provider]  nitroGLYCERIN (NITROSTAT) 0.4 MG SL tablet Place 1 tablet (0.4 mg total) under the tongue every 5 (five) minutes as needed for chest pain. 10/23/17  Yes Mody, Ulice Bold, MD  potassium chloride (K-DUR,KLOR-CON) 10 MEQ tablet Take 1 tablet (10 mEq total) by mouth daily. 10/23/17  Yes Mody, Ulice Bold, MD  torsemide (DEMADEX) 20 MG tablet Take 2 tablets (40 mg total) by mouth daily. 10/23/17  Yes Mody, Ulice Bold, MD  apixaban (ELIQUIS) 5 MG TABS tablet Take 1 tablet (5 mg total) by mouth 2 (two) times daily. Patient not taking: Reported on 11/05/2017 10/24/17   Bettey Costa, MD  atorvastatin (LIPITOR) 40 MG tablet  Take 1 tablet (40 mg total) by mouth daily at 6 PM. Patient not taking: Reported on 11/05/2017 10/23/17   Bettey Costa, MD  benzonatate (TESSALON) 100 MG capsule Take 100-200 mg by mouth 3 (three) times daily as needed for cough.    [provider]      PHYSICAL EXAMINATION:   VITAL SIGNS: Blood pressure (!) 151/76, pulse 86, temperature 98.3 F (36.8 C), temperature source Oral, resp. rate 15, height 5\' 9"  (1.753 m), weight (!) 138.3 kg (305 lb), SpO2 97 %.  GENERAL:  67 y.o.-year-old patient lying in the bed with no acute distress.  EYES: Pupils equal, round, reactive to light and accommodation. No scleral icterus. Extraocular muscles intact.  HEENT: Head atraumatic, normocephalic. Oropharynx and nasopharynx clear.  NECK:  Supple, no jugular venous distention. No thyroid enlargement, no tenderness.  LUNGS: Reduced breath sounds bilaterally, no wheezing, rales,rhonchi or crepitation. No use of accessory muscles of respiration.  CARDIOVASCULAR: S1, S2 normal. No S3/S4.  ABDOMEN:  Soft, nontender, nondistended. Bowel sounds present.   EXTREMITIES: No pedal edema, cyanosis, or clubbing.  NEUROLOGIC: No focal weakness PSYCHIATRIC: The patient is alert and oriented x 4.  SKIN: No obvious rash, lesion, or ulcer.   LABORATORY PANEL:   CBC Recent Labs  Lab 11/05/17 1735  WBC 13.3*  HGB 12.6*  HCT 38.7*  PLT 272  MCV 92.0  MCH 29.9  MCHC 32.5  RDW 14.5   ------------------------------------------------------------------------------------------------------------------  Chemistries  Recent Labs  Lab 11/05/17 1735  NA 137  K 3.4*  CL 92*  CO2 34*  GLUCOSE 232*  BUN 22*  CREATININE 1.59*  CALCIUM 9.3  AST 40  ALT 38  ALKPHOS 67  BILITOT 0.4   ------------------------------------------------------------------------------------------------------------------ estimated creatinine clearance is 64 mL/min (A) (by C-G formula based on SCr of 1.59 mg/dL  (H)). ------------------------------------------------------------------------------------------------------------------ No results for input(s): TSH, T4TOTAL, T3FREE, THYROIDAB in the last 72 hours.  Invalid input(s): FREET3   Coagulation profile No results for input(s): INR, PROTIME in the last 168 hours. ------------------------------------------------------------------------------------------------------------------- No results for input(s): DDIMER in the last 72 hours. -------------------------------------------------------------------------------------------------------------------  Cardiac Enzymes No results for input(s): CKMB, TROPONINI, MYOGLOBIN in the last 168 hours.  Invalid input(s): CK ------------------------------------------------------------------------------------------------------------------ Invalid input(s): POCBNP  ---------------------------------------------------------------------------------------------------------------  Urinalysis No results found for: COLORURINE, APPEARANCEUR, LABSPEC, PHURINE, GLUCOSEU, HGBUR, BILIRUBINUR, KETONESUR, PROTEINUR, UROBILINOGEN, NITRITE, LEUKOCYTESUR   RADIOLOGY: No results found.  EKG: Orders placed or performed during the hospital encounter of 11/05/17  . ED EKG  . ED EKG  . EKG 12-Lead  . EKG 12-Lead    IMPRESSION AND PLAN:  1.  Acute GI bleed.  Will start patient on PPI treatment.  Will hold anticoagulants including, aspirin and Eliquis.  Avoid NSAIDs and prednisone.  Gastroenterology is consulted for further evaluation and treatment.  We will keep patient n.p.o. for possible EGD in a.m. 2.  Paroxysmal atrial fibrillation.  Patient is currently in sinus rhythm, rate controlled.  Will hold Eliquis due to GI bleed.  Cardiology is consulted for further recommendations. 3.  Diabetes type 2.  Will reduce Levemir insulin dose in half while patient is n.p.o. Continue to monitor blood sugars before meals and at  bedtime.  Will use insulin sliding scale, to correct hyperglycemia. 4.  CKD 3.  Creatinine is at baseline.  Continue to monitor kidney function closely and avoid nephrotoxic medications. 5.  Coronary artery disease, stable, continue medical treatment.  Continue telemetry monitor. 6.  COPD, stable.  Will restart home maintenance medications.  All the records are reviewed and case discussed with ED provider. Management plans discussed with the patient and he is in agreement.  CODE STATUS: Code Status History    Date Active Date Inactive Code Status Order ID Comments User Context   10/20/2017 12:14 10/23/2017 17:47 Full Code [id number]  Gorden Harms, MD ED   07/27/2016 15:15 07/29/2016 16:01 Full Code [id number]  Max Sane, MD ED       TOTAL TIME TAKING CARE OF THIS PATIENT: 45 minutes.    Amelia Jo M.D on 11/05/2017 at 11:53 PM  Between 7am to 6pm - Pager - [phone]  After 6pm go to [email or web address] - password [email or web address] [facility]  Office  [phone]  CC: Primary care physician; [name], MD

## 2017-11-06 ENCOUNTER — Other Ambulatory Visit: Payer: Self-pay

## 2017-11-06 LAB — CBC
HCT: 38.5 % — ABNORMAL LOW (ref 40.0–52.0)
HEMOGLOBIN: 12.9 g/dL — AB (ref 13.0–18.0)
MCH: 30.8 pg (ref 26.0–34.0)
MCHC: 33.6 g/dL (ref 32.0–36.0)
MCV: 91.8 fL (ref 80.0–100.0)
Platelets: 282 10*3/uL (ref 150–440)
RBC: 4.19 MIL/uL — ABNORMAL LOW (ref 4.40–5.90)
RDW: 14.8 % — AB (ref 11.5–14.5)
WBC: 14.8 10*3/uL — ABNORMAL HIGH (ref 3.8–10.6)

## 2017-11-06 LAB — BASIC METABOLIC PANEL
ANION GAP: 11 (ref 5–15)
BUN: 22 mg/dL — AB (ref 6–20)
CO2: 31 mmol/L (ref 22–32)
Calcium: 9.2 mg/dL (ref 8.9–10.3)
Chloride: 96 mmol/L — ABNORMAL LOW (ref 101–111)
Creatinine, Ser: 1.4 mg/dL — ABNORMAL HIGH (ref 0.61–1.24)
GFR calc Af Amer: 59 mL/min — ABNORMAL LOW (ref >60)
GFR, EST NON AFRICAN AMERICAN: 51 mL/min — AB (ref >60)
GLUCOSE: 186 mg/dL — AB (ref 65–99)
Potassium: 3.5 mmol/L (ref 3.5–5.1)
SODIUM: 138 mmol/L (ref 135–145)

## 2017-11-06 LAB — GLUCOSE, CAPILLARY
GLUCOSE-CAPILLARY: 110 mg/dL — AB (ref 65–99)
GLUCOSE-CAPILLARY: 118 mg/dL — AB (ref 65–99)
GLUCOSE-CAPILLARY: 167 mg/dL — AB (ref 65–99)
Glucose-Capillary: 161 mg/dL — ABNORMAL HIGH (ref 65–99)

## 2017-11-06 MED ORDER — NITROGLYCERIN 0.4 MG SL SUBL: 0.4000 mg | SUBLINGUAL_TABLET | SUBLINGUAL | Status: DC | PRN

## 2017-11-06 MED ORDER — INSULIN ASPART 100 UNIT/ML ~~LOC~~ SOLN: 0.0000 [IU] | Freq: Every day | SUBCUTANEOUS | Status: DC

## 2017-11-06 MED ORDER — ACETAMINOPHEN 650 MG RE SUPP: 650.0000 mg | Freq: Four times a day (QID) | RECTAL | Status: DC | PRN

## 2017-11-06 MED ORDER — ALBUTEROL SULFATE (2.5 MG/3ML) 0.083% IN NEBU
2.5000 mg | INHALATION_SOLUTION | Freq: Four times a day (QID) | RESPIRATORY_TRACT | Status: DC | PRN
  Administered 2017-11-07: 2.5 mg via RESPIRATORY_TRACT
  Filled 2017-11-06: qty 3

## 2017-11-06 MED ORDER — TRAZODONE HCL 50 MG PO TABS: 25.0000 mg | ORAL_TABLET | Freq: Every evening | ORAL | Status: DC | PRN

## 2017-11-06 MED ORDER — BISACODYL 5 MG PO TBEC: 5.0000 mg | DELAYED_RELEASE_TABLET | Freq: Every day | ORAL | Status: DC | PRN

## 2017-11-06 MED ORDER — LORATADINE 10 MG PO TABS
10.0000 mg | ORAL_TABLET | Freq: Every day | ORAL | Status: DC
  Administered 2017-11-06 – 2017-11-07 (×2): 10 mg via ORAL
  Filled 2017-11-06 (×2): qty 1

## 2017-11-06 MED ORDER — INFLUENZA VAC SPLIT HIGH-DOSE 0.5 ML IM SUSY
0.5000 mL | PREFILLED_SYRINGE | INTRAMUSCULAR | Status: AC
  Administered 2017-11-07: 0.5 mL via INTRAMUSCULAR
  Filled 2017-11-06: qty 0.5

## 2017-11-06 MED ORDER — PEG 3350-KCL-NA BICARB-NACL 420 G PO SOLR
4000.0000 mL | Freq: Once | ORAL | Status: AC
  Administered 2017-11-06: 4000 mL via ORAL
  Filled 2017-11-06: qty 4000

## 2017-11-06 MED ORDER — TORSEMIDE 20 MG PO TABS
10.0000 mg | ORAL_TABLET | Freq: Every day | ORAL | Status: DC
  Administered 2017-11-06 – 2017-11-07 (×2): 10 mg via ORAL
  Filled 2017-11-06 (×2): qty 1

## 2017-11-06 MED ORDER — INSULIN ASPART 100 UNIT/ML ~~LOC~~ SOLN
0.0000 [IU] | Freq: Three times a day (TID) | SUBCUTANEOUS | Status: DC
  Administered 2017-11-06 – 2017-11-07 (×4): 3 [IU] via SUBCUTANEOUS
  Filled 2017-11-06 (×4): qty 1

## 2017-11-06 MED ORDER — ATORVASTATIN CALCIUM 20 MG PO TABS: 40.0000 mg | ORAL_TABLET | Freq: Every day | ORAL | Status: DC

## 2017-11-06 MED ORDER — VITAMIN D 1000 UNITS PO TABS
1000.0000 [IU] | ORAL_TABLET | Freq: Every day | ORAL | Status: DC
  Administered 2017-11-06 – 2017-11-07 (×2): 1000 [IU] via ORAL
  Filled 2017-11-06 (×3): qty 1

## 2017-11-06 MED ORDER — ACETAMINOPHEN 325 MG PO TABS: 650.0000 mg | ORAL_TABLET | Freq: Four times a day (QID) | ORAL | Status: DC | PRN

## 2017-11-06 MED ORDER — HYDROCODONE-ACETAMINOPHEN 5-325 MG PO TABS: 1.0000 | ORAL_TABLET | ORAL | Status: DC | PRN

## 2017-11-06 MED ORDER — MOMETASONE FURO-FORMOTEROL FUM 200-5 MCG/ACT IN AERO
2.0000 | INHALATION_SPRAY | Freq: Two times a day (BID) | RESPIRATORY_TRACT | Status: DC
  Administered 2017-11-06 (×2): 2 via RESPIRATORY_TRACT
  Filled 2017-11-06: qty 8.8

## 2017-11-06 MED ORDER — CARVEDILOL 12.5 MG PO TABS
12.5000 mg | ORAL_TABLET | Freq: Two times a day (BID) | ORAL | Status: DC
  Administered 2017-11-06: 12.5 mg via ORAL
  Filled 2017-11-06 (×2): qty 1

## 2017-11-06 MED ORDER — LABETALOL HCL 100 MG PO TABS
100.0000 mg | ORAL_TABLET | Freq: Two times a day (BID) | ORAL | Status: DC | PRN
  Filled 2017-11-06: qty 1

## 2017-11-06 MED ORDER — SODIUM CHLORIDE 0.9 % IV SOLN
INTRAVENOUS | Status: DC
  Administered 2017-11-07: 08:00:00 via INTRAVENOUS

## 2017-11-06 MED ORDER — FLUTICASONE PROPIONATE 50 MCG/ACT NA SUSP
2.0000 | Freq: Every day | NASAL | Status: DC
  Administered 2017-11-06 – 2017-11-07 (×2): 2 via NASAL
  Filled 2017-11-06 (×3): qty 16

## 2017-11-06 MED ORDER — ONDANSETRON HCL 4 MG PO TABS: 4.0000 mg | ORAL_TABLET | Freq: Four times a day (QID) | ORAL | Status: DC | PRN

## 2017-11-06 MED ORDER — LOSARTAN POTASSIUM 50 MG PO TABS
100.0000 mg | ORAL_TABLET | Freq: Every day | ORAL | Status: DC
  Administered 2017-11-06 – 2017-11-07 (×2): 100 mg via ORAL
  Filled 2017-11-06 (×2): qty 2

## 2017-11-06 MED ORDER — SODIUM CHLORIDE 0.9% FLUSH
3.0000 mL | Freq: Two times a day (BID) | INTRAVENOUS | Status: DC
  Administered 2017-11-06 – 2017-11-07 (×2): 3 mL via INTRAVENOUS

## 2017-11-06 MED ORDER — DOCUSATE SODIUM 100 MG PO CAPS
100.0000 mg | ORAL_CAPSULE | Freq: Two times a day (BID) | ORAL | Status: DC
  Administered 2017-11-06 – 2017-11-07 (×2): 100 mg via ORAL
  Filled 2017-11-06 (×2): qty 1

## 2017-11-06 MED ORDER — AMLODIPINE BESYLATE 10 MG PO TABS
10.0000 mg | ORAL_TABLET | Freq: Every day | ORAL | Status: DC
  Administered 2017-11-06 – 2017-11-07 (×2): 10 mg via ORAL
  Filled 2017-11-06 (×2): qty 1

## 2017-11-06 MED ORDER — PANTOPRAZOLE SODIUM 40 MG IV SOLR
40.0000 mg | Freq: Two times a day (BID) | INTRAVENOUS | Status: DC
  Administered 2017-11-06 – 2017-11-07 (×3): 40 mg via INTRAVENOUS
  Filled 2017-11-06 (×3): qty 40

## 2017-11-06 MED ORDER — ONDANSETRON HCL 4 MG/2ML IJ SOLN: 4.0000 mg | Freq: Four times a day (QID) | INTRAMUSCULAR | Status: DC | PRN

## 2017-11-06 MED ORDER — POTASSIUM CHLORIDE CRYS ER 10 MEQ PO TBCR
10.0000 meq | EXTENDED_RELEASE_TABLET | Freq: Every day | ORAL | Status: DC
  Administered 2017-11-06 – 2017-11-07 (×2): 10 meq via ORAL
  Filled 2017-11-06 (×2): qty 1

## 2017-11-06 MED ORDER — BENZONATATE 100 MG PO CAPS
100.0000 mg | ORAL_CAPSULE | Freq: Three times a day (TID) | ORAL | Status: DC | PRN
  Administered 2017-11-06 (×2): 200 mg via ORAL
  Administered 2017-11-07: 100 mg via ORAL
  Filled 2017-11-06: qty 2
  Filled 2017-11-06: qty 1
  Filled 2017-11-06: qty 2

## 2017-11-06 MED ORDER — INSULIN DETEMIR 100 UNIT/ML ~~LOC~~ SOLN
40.0000 [IU] | Freq: Two times a day (BID) | SUBCUTANEOUS | Status: DC
  Administered 2017-11-06 (×2): 40 [IU] via SUBCUTANEOUS
  Filled 2017-11-06 (×4): qty 0.4

## 2017-11-06 MED ORDER — ADULT MULTIVITAMIN W/MINERALS CH
1.0000 | ORAL_TABLET | Freq: Every day | ORAL | Status: DC
  Administered 2017-11-06 – 2017-11-07 (×2): 1 via ORAL
  Filled 2017-11-06 (×2): qty 1

## 2017-11-06 MED ORDER — IPRATROPIUM BROMIDE 0.02 % IN SOLN: 0.5000 mg | Freq: Four times a day (QID) | RESPIRATORY_TRACT | Status: DC | PRN

## 2017-11-06 NOTE — Consult Note (Signed)
Cardiology Consultation Note    Patient ID: Jason Hudson, MRN: [id number], DOB/AGE: [birth year] 67 y.o. Admit date: 11/05/2017   Date of Consult: 11/06/2017 Primary Physician: [name], MD Primary Cardiologist: Dr. Clayborn Bigness  Chief Complaint: gi bleed Reason for Consultation: gi bleed Requesting MD: Dr. Leslye Peer  HPI: Jason Hudson is a 67 y.o. male with history of congestive heart failure, COPD, diabetes and hypertension.  He has a history of coronary artery disease status post cardiac catheterization on 10/24/2017.  This revealed an ostial 70% septal perforator, 50% second marginal, 100% RCA.  Medical management was recommended due to collaterals from the left.  He was placed on Eliquis for stroke prevention after being noted to have paroxysmal atrial fibrillation.  He was started on this drug and did well although has had a history of hemorrhoids.  He presented to the emergency room with blood in his stool for 3-4 days.  He described melena as well as bright red blood.  He had no nausea or vomiting.  He was in sinus rhythm on electrocardiogram.  Hemoglobin was 12.6 in the emergency room.  He was taken off of aspirin and Eliquis.  He is currently hemodynamically stable and awaiting vision.  He is currently stable from a cardiac standpoint.  Past Medical History:  Diagnosis Date  . CHF (congestive heart failure) ([place])   . COPD (chronic obstructive pulmonary disease) ([place])   . Diabetes mellitus without complication ([place])   . Hypertension       Surgical History:  Past Surgical History:  Procedure Laterality Date  . LEFT HEART CATH AND CORONARY ANGIOGRAPHY N/A 10/23/2017   Procedure: LEFT HEART CATH AND CORONARY ANGIOGRAPHY;  Surgeon: Isaias Cowman, MD;  Location: [facility];  Service: Cardiovascular;  Laterality: N/A;     Home Meds: Prior to Admission medications   Medication Sig Start Date End Date Taking? Authorizing Provider  albuterol (PROVENTIL  HFA;VENTOLIN HFA) 108 (90 Base) MCG/ACT inhaler Inhale 1-2 puffs into the lungs every 6 (six) hours as needed for wheezing or shortness of breath.   Yes [provider]  amLODipine (NORVASC) 10 MG tablet Take 10 mg by mouth daily.   Yes [provider]  aspirin EC 81 MG EC tablet Take 1 tablet (81 mg total) by mouth daily. 10/23/17  Yes Mody, Ulice Bold, MD  budesonide-formoterol (SYMBICORT) 160-4.5 MCG/ACT inhaler Inhale 2 puffs into the lungs 2 (two) times daily.   Yes [provider]  carvedilol (COREG) 25 MG tablet Take 1 tablet (25 mg total) by mouth 2 (two) times daily with a meal. Patient taking differently: Take 12.5 mg by mouth 2 (two) times daily with a meal.  10/23/17  Yes Mody, Sital, MD  cholecalciferol (VITAMIN D) 1000 units tablet Take 1,000 Units by mouth daily.   Yes [provider]  fluticasone (FLONASE) 50 MCG/ACT nasal spray Place 2 sprays into both nostrils daily.   Yes [provider]  glipiZIDE (GLUCOTROL XL) 10 MG 24 hr tablet Take 10 mg by mouth daily with breakfast.   Yes [provider]  hydrochlorothiazide (HYDRODIURIL) 12.5 MG tablet Take 1 tablet by mouth daily.   Yes [provider]  insulin detemir (LEVEMIR) 100 UNIT/ML injection Inject 80 Units into the skin 2 (two) times daily.   Yes [provider]  ipratropium (ATROVENT) 0.02 % nebulizer solution Take 0.5 mg by nebulization every 6 (six) hours as needed for wheezing or shortness of breath.   Yes [provider]  labetalol (NORMODYNE) 100 MG tablet Take 100 mg by mouth 2 (two) times daily.   Yes [provider]  loratadine (CLARITIN) 10 MG tablet Take 10 mg by mouth daily.   Yes [provider]  losartan (COZAAR) 100 MG tablet Take 100 mg by mouth daily.   Yes [provider]  metFORMIN (GLUCOPHAGE) 1000 MG tablet Take 1 tablet (1,000 mg total) by mouth 2 (two) times daily with a meal. 07/29/16  Yes [name], MD   mometasone-formoterol (DULERA) 100-5 MCG/ACT AERO Inhale 2 puffs into the lungs 2 (two) times daily.   Yes [provider]  multivitamin (ONE-A-DAY MEN'S) TABS tablet Take 1 tablet by mouth daily.   Yes [provider]  nitroGLYCERIN (NITROSTAT) 0.4 MG SL tablet Place 1 tablet (0.4 mg total) under the tongue every 5 (five) minutes as needed for chest pain. 10/23/17  Yes Mody, Ulice Bold, MD  potassium chloride (K-DUR,KLOR-CON) 10 MEQ tablet Take 1 tablet (10 mEq total) by mouth daily. 10/23/17  Yes Mody, Ulice Bold, MD  torsemide (DEMADEX) 20 MG tablet Take 2 tablets (40 mg total) by mouth daily. 10/23/17  Yes Mody, Ulice Bold, MD  apixaban (ELIQUIS) 5 MG TABS tablet Take 1 tablet (5 mg total) by mouth 2 (two) times daily. Patient not taking: Reported on 11/05/2017 10/24/17   Bettey Costa, MD  atorvastatin (LIPITOR) 40 MG tablet Take 1 tablet (40 mg total) by mouth daily at 6 PM. Patient not taking: Reported on 11/05/2017 10/23/17   Bettey Costa, MD  benzonatate (TESSALON) 100 MG capsule Take 100-200 mg by mouth 3 (three) times daily as needed for cough.    [provider]    Inpatient Medications:  . amLODipine  10 mg Oral Daily  . carvedilol  12.5 mg Oral BID WC  . cholecalciferol  1,000 Units Oral Daily  . docusate sodium  100 mg Oral BID  . fluticasone  2 spray Each Nare Daily  . [START ON 11/07/2017] Influenza vac split quadrivalent PF  0.5 mL Intramuscular Tomorrow-1000  . insulin aspart  0-15 Units Subcutaneous TID WC  . insulin aspart  0-5 Units Subcutaneous QHS  . insulin detemir  40 Units Subcutaneous BID  . loratadine  10 mg Oral Daily  . losartan  100 mg Oral Daily  . mometasone-formoterol  2 puff Inhalation BID  . multivitamin with minerals  1 tablet Oral Daily  . pantoprazole (PROTONIX) IV  40 mg Intravenous Q12H  . potassium chloride  10 mEq Oral Daily  . torsemide  10 mg Oral Daily     Allergies:  Allergies  Allergen Reactions  . Red Dye Anaphylaxis    "not as  allergic to it as I used to be. I can take a little of it"    Social History   Socioeconomic History  . Marital status: Single    Spouse name: Not on file  . Number of children: Not on file  . Years of education: Not on file  . Highest education level: Not on file  Social Needs  . Financial resource strain: Not on file  . Food insecurity - worry: Not on file  . Food insecurity - inability: Not on file  . Transportation needs - medical: Not on file  . Transportation needs - non-medical: Not on file  Occupational History  . Not on file  Tobacco Use  . Smoking status: Never Smoker  . Smokeless tobacco: Never Used  Substance and Sexual Activity  . Alcohol use: No  .  Drug use: Not on file  . Sexual activity: Not on file  Other Topics Concern  . Not on file  Social History Narrative  . Not on file     History reviewed. No pertinent family history.   Review of Systems: A 12-system review of systems was performed and is negative except as noted in the HPI.  Labs: No results for input(s): CKTOTAL, CKMB, TROPONINI in the last 72 hours. Lab Results  Component Value Date   WBC 14.8 (H) 11/06/2017   HGB 12.9 (L) 11/06/2017   HCT 38.5 (L) 11/06/2017   MCV 91.8 11/06/2017   PLT 282 11/06/2017    Recent Labs  Lab 11/05/17 1735 11/06/17 0424  NA 137 138  K 3.4* 3.5  CL 92* 96*  CO2 34* 31  BUN 22* 22*  CREATININE 1.59* 1.40*  CALCIUM 9.3 9.2  PROT 7.6  --   BILITOT 0.4  --   ALKPHOS 67  --   ALT 38  --   AST 40  --   GLUCOSE 232* 186*   Lab Results  Component Value Date   CHOL 170 10/21/2017   HDL 42 10/21/2017   LDLCALC 101 (H) 10/21/2017   TRIG 135 10/21/2017   No results found for: DDIMER  Radiology/Studies:  Dg Chest Port 1 View  Result Date: 10/20/2017 CLINICAL DATA:  67 y/o M; 1 week of shortness of breath. Diagnosis with flu last week. History of COPD and asthma. EXAM: PORTABLE CHEST 1 VIEW COMPARISON:  07/27/2016 chest radiograph FINDINGS: The heart  size and mediastinal contours are within normal limits. Perihilar bronchitic changes. No consolidation. No pleural effusion or pneumothorax. The visualized skeletal structures are unremarkable. IMPRESSION: Perihilar bronchitic changes.  No focal consolidation. Electronically Signed   By: Kristine Garbe M.D.   On: 10/20/2017 02:53    Wt Readings from Last 3 Encounters:  11/06/17 (!) 143.3 kg (316 lb)  10/23/17 (!) 139.9 kg (308 lb 6.4 oz)  07/27/16 129.3 kg (285 lb)    EKG: Normal sinus rhythm  Physical Exam:  Blood pressure 140/72, pulse 89, temperature 98.2 F (36.8 C), temperature source Oral, resp. rate 19, height 5\' 8"  (1.727 m), weight (!) 143.3 kg (316 lb), SpO2 94 %. Body mass index is 48.05 kg/m. General: Well developed, well nourished, in no acute distress. Head: Normocephalic, atraumatic, sclera non-icteric, no xanthomas, nares are without discharge.  Neck: Negative for carotid bruits. JVD not elevated. Lungs: Clear bilaterally to auscultation without wheezes, rales, or rhonchi. Breathing is unlabored. Heart: RRR with S1 S2. No murmurs, rubs, or gallops appreciated. Abdomen: Soft, non-tender, non-distended with normoactive bowel sounds. No hepatomegaly. No rebound/guarding. No obvious abdominal masses. Msk:  Strength and tone appear normal for age. Extremities: No clubbing or cyanosis. No edema.  Distal pedal pulses are 2+ and equal bilaterally. Neuro: Alert and oriented X 3. No facial asymmetry. No focal deficit. Moves all extremities spontaneously. Psych:  Responds to questions appropriately with a normal affect.     Assessment and Plan  67 year old male with history of paroxysmal atrial fibrillation with a chads score of 3 who was on Eliquis as well as aspirin after cardiac catheterization revealed occluded RCA with collaterals from the left.  He was noted to have melena as well as bright red blood per rectum and presented to the emergency room where his hemoglobin  was 12.6.  He was taken off of Eliquis and aspirin.  His bleeding has improved.  He is awaiting lower GI.  At  this point despite a chads score of 3, would remain off of Eliquis and aspirin until further GI workup is completed.  Consideration for remaining off of Eliquis for the foreseeable future is appropriate given his bleeding and last a treatable source is identified.  Would remain off Eliquis and aspirin for now and await further GI workup  Signed, Teodoro Spray MD 11/06/2017, 3:43 PM Pager: [phone]

## 2017-11-06 NOTE — Plan of Care (Signed)
  Clinical Measurements: Ability to maintain clinical measurements within normal limits will improve 11/06/2017 1348 - Not Progressing by Daron Offer, RN Note Patient's BUN today is elevated at 22. Will continue to monitor renal status. Wenda Low [facility]

## 2017-11-06 NOTE — Plan of Care (Signed)
Continues to tolerate bowel prep with liquid stools with brown solid particles at this time.

## 2017-11-06 NOTE — Consult Note (Signed)
Jason Hudson , MD [address], [place], [place], [place], [address] [id number] [place], [place], [place], [address] Phone: [phone]  Fax: [phone]  Consultation  Referring Provider:     Dr Leslye Peer Primary Care Physician:  [name], MD Primary Gastroenterologist:  None          Reason for Consultation:     Melena   Date of Admission:  11/05/2017 Date of Consultation:  11/06/2017         HPI:   Jason Hudson is a 67 y.o. male who is on Eloquis for atrial fibrillation presented to the ER yesterday with melena and rectal bleeding for 3-4 days duration. On treatment with steroids for a COPD ex .    He says he has been on Eloquis last dose on Saturday , for many years has been on ibuprofen which he takes daily. Denies any abdominal pain. Last bowel movement was before coming into the hospital . Last colonoscopy was some years back and was told he had polyps. Could not have a repeat as he says he had a very tight turn in his colon .    He says for the last few days has seen bright red color in addition to black colored stools.   Past Medical History:  Diagnosis Date  . CHF (congestive heart failure) ([place])   . COPD (chronic obstructive pulmonary disease) ([place])   . Diabetes mellitus without complication ([place])   . Hypertension     Past Surgical History:  Procedure Laterality Date  . LEFT HEART CATH AND CORONARY ANGIOGRAPHY N/A 10/23/2017   Procedure: LEFT HEART CATH AND CORONARY ANGIOGRAPHY;  Surgeon: Isaias Cowman, MD;  Location: [facility];  Service: Cardiovascular;  Laterality: N/A;    Prior to Admission medications   Medication Sig Start Date End Date Taking? Authorizing Provider  albuterol (PROVENTIL HFA;VENTOLIN HFA) 108 (90 Base) MCG/ACT inhaler Inhale 1-2 puffs into the lungs every 6 (six) hours as needed for wheezing or shortness of breath.   Yes [provider]  amLODipine (NORVASC) 10 MG tablet Take 10 mg by mouth daily.    Yes [provider]  aspirin EC 81 MG EC tablet Take 1 tablet (81 mg total) by mouth daily. 10/23/17  Yes Mody, Ulice Bold, MD  budesonide-formoterol (SYMBICORT) 160-4.5 MCG/ACT inhaler Inhale 2 puffs into the lungs 2 (two) times daily.   Yes [provider]  carvedilol (COREG) 25 MG tablet Take 1 tablet (25 mg total) by mouth 2 (two) times daily with a meal. Patient taking differently: Take 12.5 mg by mouth 2 (two) times daily with a meal.  10/23/17  Yes Mody, Sital, MD  cholecalciferol (VITAMIN D) 1000 units tablet Take 1,000 Units by mouth daily.   Yes [provider]  fluticasone (FLONASE) 50 MCG/ACT nasal spray Place 2 sprays into both nostrils daily.   Yes [provider]  glipiZIDE (GLUCOTROL XL) 10 MG 24 hr tablet Take 10 mg by mouth daily with breakfast.   Yes [provider]  hydrochlorothiazide (HYDRODIURIL) 12.5 MG tablet Take 1 tablet by mouth daily.   Yes [provider]  insulin detemir (LEVEMIR) 100 UNIT/ML injection Inject 80 Units into the skin 2 (two) times daily.   Yes [provider]  ipratropium (ATROVENT) 0.02 % nebulizer solution Take 0.5 mg by nebulization every 6 (six) hours as needed for wheezing or shortness of breath.   Yes [provider]  labetalol (NORMODYNE) 100 MG tablet Take 100  mg by mouth 2 (two) times daily.   Yes [provider]  loratadine (CLARITIN) 10 MG tablet Take 10 mg by mouth daily.   Yes [provider]  losartan (COZAAR) 100 MG tablet Take 100 mg by mouth daily.   Yes [provider]  metFORMIN (GLUCOPHAGE) 1000 MG tablet Take 1 tablet (1,000 mg total) by mouth 2 (two) times daily with a meal. 07/29/16  Yes [name], MD  mometasone-formoterol (DULERA) 100-5 MCG/ACT AERO Inhale 2 puffs into the lungs 2 (two) times daily.   Yes [provider]  multivitamin (ONE-A-DAY MEN'S) TABS tablet Take 1 tablet by mouth daily.   Yes [provider]    nitroGLYCERIN (NITROSTAT) 0.4 MG SL tablet Place 1 tablet (0.4 mg total) under the tongue every 5 (five) minutes as needed for chest pain. 10/23/17  Yes Mody, Ulice Bold, MD  potassium chloride (K-DUR,KLOR-CON) 10 MEQ tablet Take 1 tablet (10 mEq total) by mouth daily. 10/23/17  Yes Mody, Ulice Bold, MD  torsemide (DEMADEX) 20 MG tablet Take 2 tablets (40 mg total) by mouth daily. 10/23/17  Yes Mody, Ulice Bold, MD  apixaban (ELIQUIS) 5 MG TABS tablet Take 1 tablet (5 mg total) by mouth 2 (two) times daily. Patient not taking: Reported on 11/05/2017 10/24/17   Bettey Costa, MD  atorvastatin (LIPITOR) 40 MG tablet Take 1 tablet (40 mg total) by mouth daily at 6 PM. Patient not taking: Reported on 11/05/2017 10/23/17   Bettey Costa, MD  benzonatate (TESSALON) 100 MG capsule Take 100-200 mg by mouth 3 (three) times daily as needed for cough.    [provider]    History reviewed. No pertinent family history.   Social History   Tobacco Use  . Smoking status: Never Smoker  . Smokeless tobacco: Never Used  Substance Use Topics  . Alcohol use: No  . Drug use: Not on file    Allergies as of 11/05/2017 - Review Complete 11/05/2017  Allergen Reaction Noted  . Red dye Anaphylaxis 07/27/2016    Review of Systems:    All systems reviewed and negative except where noted in HPI.   Physical Exam:  Vital signs in last 24 hours: Temp:  [98.1 F (36.7 C)-98.3 F (36.8 C)] 98.2 F (36.8 C) (03/18 0410) Pulse Rate:  [84-89] 89 (03/18 0835) Resp:  [15-20] 19 (03/18 0835) BP: (128-166)/(66-81) 140/72 (03/18 0835) SpO2:  [92 %-97 %] 94 % (03/18 0835) Weight:  [305 lb (138.3 kg)-316 lb (143.3 kg)] 316 lb (143.3 kg) (03/18 0410) Last BM Date: 11/05/17 General:   Pleasant, cooperative in NAD Head:  Normocephalic and atraumatic. Eyes:   No icterus.   Conjunctiva pink. PERRLA. Ears:  Normal auditory acuity. Neck:  Supple; no masses or thyroidomegaly Lungs: Respirations even and unlabored. Lungs clear to  auscultation bilaterally.   No wheezes, crackles, or rhonchi.  Heart:  Regular rate and rhythm;  Without murmur, clicks, rubs or gallops Abdomen:  Soft, nondistended, nontender. Normal bowel sounds. No appreciable masses or hepatomegaly.  No rebound or guarding.  Neurologic:  Alert and oriented x3;  grossly normal neurologically. Skin:  Intact without significant lesions or rashes. Cervical Nodes:  No significant cervical adenopathy. Psych:  Alert and cooperative. Normal affect.  LAB RESULTS: Recent Labs    11/05/17 1735 11/06/17 0424  WBC 13.3* 14.8*  HGB 12.6* 12.9*  HCT 38.7* 38.5*  PLT 272 282   BMET Recent Labs    11/05/17 1735 11/06/17 0424  NA 137 138  K 3.4* 3.5  CL 92* 96*  CO2 34* 31  GLUCOSE 232* 186*  BUN 22* 22*  CREATININE 1.59* 1.40*  CALCIUM 9.3 9.2   LFT Recent Labs    11/05/17 1735  PROT 7.6  ALBUMIN 3.3*  AST 40  ALT 38  ALKPHOS 67  BILITOT 0.4   PT/INR No results for input(s): LABPROT, INR in the last 72 hours.  STUDIES: No results found.    Impression / Plan:   Jason Hudson is a 67 y.o. y/o male admitted with a possible GI bleed.  With no significant drop in Hb the bleeding could be a combination of upper and lower GI bleeds. He is on Eloquis . No significant elevation of BUN/Cr ratio which makes it less likely upper GI.    Plan  1. EGD+ colonoscopy tomorrow  2. PPI 3. Monitor CBC and transfuse as needed  4. Stop chronic nsaid use.    I have discussed risks & benefits of the procedure  which include, but are not limited to, bleeding, infection, perforation,respiratory compromise & drug reaction.  The patient agrees with this plan & written consent will be obtained.     Thank you for involving me in the care of this patient.      LOS: 1 day   Jason Bellows, MD  11/06/2017, 8:47 AM

## 2017-11-06 NOTE — Progress Notes (Signed)
Pt. Arrived via stretcher, walked to bed with standby assist. Pt alert and oriented x4. Tele applied, and verified with CCMD, with Vincente Liberty, Therapist, sports as 2nd verifier. Skin assessed with Baldo Ash, RN , bruising to abdomen. Fall bracelet and allergy bractlet placed on right wrist. General room orientation given. Instruction on how to use ascom and call bell system given. Pt. Refuses bed alarm, pt. Educated on the risk of falling. Staff informed of refusal. Pt also stated he sleeps in the recliner at home and will do so here. Pt. Is up in recliner at this time. Will continue to monitor pt.

## 2017-11-06 NOTE — Care Management (Signed)
Patient on chronic Eliquis and aspirin presents with vomiting bright red blood and melan.  Hgb stable . Cardiology consulting. GI to consult

## 2017-11-06 NOTE — Progress Notes (Signed)
Dr. Duane Boston paged to clarify if pt. Could eat, she said yes but NPO after he eats until Dr's round in the a.m. And decide if a procedure is needed. Will give pt. A [place] sandwich tray.

## 2017-11-06 NOTE — Progress Notes (Signed)
Patient ID: Jason Hudson, male   DOB: [birth year], 67 y.o.   MRN: [id number]  [employer] PROGRESS NOTE  Deanna Boehlke [facility] OYD:[id number] DOB: [birth year] DOA: 11/05/2017 PCP: [name], MD  HPI/Subjective: Patient seen earlier and he stated he was hungry.  I spoke with Dr. Vicente Males gastroenterology and there was no anesthesia coverage after 3 PM.  He stated that I can start him on liquid diet today and potential procedures for tomorrow.  Patient stated he stopped his Eliquis 2 days ago.  He has noticed some black stools since starting the Eliquis.  He is also seen bright red blood per rectum with his hemorrhoids.  Objective: Vitals:   11/06/17 0410 11/06/17 0835  BP: 128/66 140/72  Pulse: 88 89  Resp: 18 19  Temp: 98.2 F (36.8 C)   SpO2: 92% 94%    Filed Weights   11/05/17 1725 11/06/17 0011 11/06/17 0410  Weight: (!) 138.3 kg (305 lb) (!) 143.2 kg (315 lb 11.2 oz) (!) 143.3 kg (316 lb)    ROS: Review of Systems  Constitutional: Negative for chills and fever.  Eyes: Negative for blurred vision.  Respiratory: Negative for cough and shortness of breath.   Cardiovascular: Negative for chest pain.  Gastrointestinal: Negative for abdominal pain, constipation, diarrhea, nausea and vomiting.  Genitourinary: Negative for dysuria.  Musculoskeletal: Negative for joint pain.  Neurological: Negative for dizziness and headaches.   Exam: Physical Exam  Constitutional: He is oriented to person, place, and time.  HENT:  Nose: No mucosal edema.  Mouth/Throat: No oropharyngeal exudate or posterior oropharyngeal edema.  Eyes: Conjunctivae, EOM and lids are normal. Pupils are equal, round, and reactive to light.  Neck: No JVD present. Carotid bruit is not present. No edema present. No thyroid mass and no thyromegaly present.  Cardiovascular: S1 normal and S2 normal. Exam reveals no gallop.  No murmur heard. Pulses:      Dorsalis pedis pulses are 2+ on the right side, and 2+  on the left side.  Respiratory: No respiratory distress. He has decreased breath sounds in the right lower field and the left lower field. He has no wheezes. He has no rhonchi. He has no rales.  GI: Soft. Bowel sounds are normal. There is no tenderness.  Musculoskeletal:       Right ankle: He exhibits swelling.       Left ankle: He exhibits swelling.  Lymphadenopathy:    He has no cervical adenopathy.  Neurological: He is alert and oriented to person, place, and time. No cranial nerve deficit.  Skin: Skin is warm. No rash noted. Nails show no clubbing.  Psychiatric: He has a normal mood and affect.      Data Reviewed: Basic Metabolic Panel: Recent Labs  Lab 11/05/17 1735 11/06/17 0424  NA 137 138  K 3.4* 3.5  CL 92* 96*  CO2 34* 31  GLUCOSE 232* 186*  BUN 22* 22*  CREATININE 1.59* 1.40*  CALCIUM 9.3 9.2   Liver Function Tests: Recent Labs  Lab 11/05/17 1735  AST 40  ALT 38  ALKPHOS 67  BILITOT 0.4  PROT 7.6  ALBUMIN 3.3*   CBC: Recent Labs  Lab 11/05/17 1735 11/06/17 0424  WBC 13.3* 14.8*  HGB 12.6* 12.9*  HCT 38.7* 38.5*  MCV 92.0 91.8  PLT 272 282   BNP (last 3 results) Recent Labs    10/20/17 1236  BNP 155.0*    CBG: Recent Labs  Lab 11/06/17 0035 11/06/17 0731  GLUCAP 118*  167*    Scheduled Meds: . amLODipine  10 mg Oral Daily  . carvedilol  12.5 mg Oral BID WC  . cholecalciferol  1,000 Units Oral Daily  . docusate sodium  100 mg Oral BID  . fluticasone  2 spray Each Nare Daily  . [START ON 11/07/2017] Influenza vac split quadrivalent PF  0.5 mL Intramuscular Tomorrow-1000  . insulin aspart  0-15 Units Subcutaneous TID WC  . insulin aspart  0-5 Units Subcutaneous QHS  . insulin detemir  40 Units Subcutaneous BID  . loratadine  10 mg Oral Daily  . losartan  100 mg Oral Daily  . mometasone-formoterol  2 puff Inhalation BID  . multivitamin with minerals  1 tablet Oral Daily  . pantoprazole (PROTONIX) IV  40 mg Intravenous Q12H  .  potassium chloride  10 mEq Oral Daily  . torsemide  10 mg Oral Daily   Continuous Infusions:  Assessment/Plan:  1. Melena also with some bright red blood per rectum.  Case discussed with gastroenterology and unable to do procedure today.  Start liquid diet today and n.p.o. after midnight for procedures tomorrow. 2. Paroxysmal atrial fibrillation.  Hold Eliquis.  Stroke risk higher off Eliquis.  On Coreg for rate control. 3. Chronic diastolic congestive heart failure on Coreg losartan and torsemide 4. Morbid obesity 5. Type 2 diabetes mellitus on detemir insulin half dose because the patient will be n.p.o.  Sliding scale insulin 6. Essential hypertension on Norvasc losartan and Coreg 7. History of COPD on Dulera inhaler  Code Status:     Code Status Orders  (From admission, onward)        Start     Ordered   11/06/17 0006  Full code  Continuous     11/06/17 0005    Code Status History    Date Active Date Inactive Code Status Order ID Comments User Context   10/20/2017 12:14 10/23/2017 17:47 Full Code [id number]  Gorden Harms, MD ED   07/27/2016 15:15 07/29/2016 16:01 Full Code [id number]  Max Sane, MD ED    Advance Directive Documentation     Most Recent Value  Type of Advance Directive  Living will  Pre-existing out of facility DNR order (yellow form or pink MOST form)  No data  "MOST" Form in Place?  No data     Disposition Plan: Potential discharge tomorrow after procedures  Consultants:  Gastroenterology  Cardiology  Time spent: 28 minutes Case discussed with cardiology and gastroenterology.   Mareo Portilla [employer]

## 2017-11-06 NOTE — Progress Notes (Signed)
Pt has slept in recliner throughout the night

## 2017-11-06 NOTE — Progress Notes (Signed)
Pt. Stated he is unable to have a colonoscopy performed because he has a "twisted bowel". The procedure has been attempted twice and unsuccessful each time.

## 2017-11-07 ENCOUNTER — Inpatient Hospital Stay: Payer: Managed Care, Other (non HMO) | Admitting: Certified Registered Nurse Anesthetist

## 2017-11-07 ENCOUNTER — Encounter: Payer: Self-pay | Admitting: Anesthesiology

## 2017-11-07 ENCOUNTER — Encounter
Admission: EM | Disposition: A | Discharge status: Home / Self Care | Payer: Self-pay | Source: Home / Self Care | Attending: Internal Medicine

## 2017-11-07 DIAGNOSIS — K922 Gastrointestinal hemorrhage, unspecified: Secondary | ICD-10-CM

## 2017-11-07 DIAGNOSIS — K259 Gastric ulcer, unspecified as acute or chronic, without hemorrhage or perforation: Secondary | ICD-10-CM

## 2017-11-07 DIAGNOSIS — D120 Benign neoplasm of cecum: Secondary | ICD-10-CM

## 2017-11-07 DIAGNOSIS — K921 Melena: Secondary | ICD-10-CM

## 2017-11-07 DIAGNOSIS — D122 Benign neoplasm of ascending colon: Secondary | ICD-10-CM

## 2017-11-07 HISTORY — PX: ESOPHAGOGASTRODUODENOSCOPY (EGD) WITH PROPOFOL: SHX5813

## 2017-11-07 HISTORY — PX: COLONOSCOPY WITH PROPOFOL: SHX5780

## 2017-11-07 LAB — GLUCOSE, CAPILLARY
GLUCOSE-CAPILLARY: 158 mg/dL — AB (ref 65–99)
Glucose-Capillary: 153 mg/dL — ABNORMAL HIGH (ref 65–99)
Glucose-Capillary: 195 mg/dL — ABNORMAL HIGH (ref 65–99)

## 2017-11-07 LAB — CBC
HCT: 38 % — ABNORMAL LOW (ref 40.0–52.0)
Hemoglobin: 12.6 g/dL — ABNORMAL LOW (ref 13.0–18.0)
MCH: 30.9 pg (ref 26.0–34.0)
MCHC: 33.3 g/dL (ref 32.0–36.0)
MCV: 92.8 fL (ref 80.0–100.0)
PLATELETS: 261 10*3/uL (ref 150–440)
RBC: 4.09 MIL/uL — AB (ref 4.40–5.90)
RDW: 14.9 % — AB (ref 11.5–14.5)
WBC: 15.3 10*3/uL — ABNORMAL HIGH (ref 3.8–10.6)

## 2017-11-07 SURGERY — ESOPHAGOGASTRODUODENOSCOPY (EGD) WITH PROPOFOL
Anesthesia: General

## 2017-11-07 MED ORDER — MIDAZOLAM HCL 2 MG/2ML IJ SOLN
INTRAMUSCULAR | Status: DC | PRN
  Administered 2017-11-07: 2 mg via INTRAVENOUS

## 2017-11-07 MED ORDER — INSULIN ASPART 100 UNIT/ML ~~LOC~~ SOLN: 7.0000 [IU] | Freq: Three times a day (TID) | SUBCUTANEOUS | 11 refills | Status: DC

## 2017-11-07 MED ORDER — LIDOCAINE HCL (PF) 2 % IJ SOLN
INTRAMUSCULAR | Status: AC
  Filled 2017-11-07: qty 30

## 2017-11-07 MED ORDER — CARVEDILOL 25 MG PO TABS: 12.5000 mg | ORAL_TABLET | Freq: Two times a day (BID) | ORAL | Status: DC

## 2017-11-07 MED ORDER — PHENYLEPHRINE HCL 10 MG/ML IJ SOLN
INTRAMUSCULAR | Status: DC | PRN
  Administered 2017-11-07: 250 ug via INTRAVENOUS
  Administered 2017-11-07: 100 ug via INTRAVENOUS
  Administered 2017-11-07: 200 ug via INTRAVENOUS
  Administered 2017-11-07: 150 ug via INTRAVENOUS
  Administered 2017-11-07: 100 ug via INTRAVENOUS
  Administered 2017-11-07: 150 ug via INTRAVENOUS

## 2017-11-07 MED ORDER — TORSEMIDE 10 MG PO TABS: 40.0000 mg | ORAL_TABLET | Freq: Every day | ORAL | Status: DC

## 2017-11-07 MED ORDER — MIDAZOLAM HCL 2 MG/2ML IJ SOLN
INTRAMUSCULAR | Status: AC
  Filled 2017-11-07: qty 2

## 2017-11-07 MED ORDER — PROPOFOL 500 MG/50ML IV EMUL
INTRAVENOUS | Status: DC | PRN
  Administered 2017-11-07: 150 ug/kg/min via INTRAVENOUS

## 2017-11-07 MED ORDER — ATORVASTATIN CALCIUM 40 MG PO TABS: 40.0000 mg | ORAL_TABLET | Freq: Every day | ORAL | 0 refills | Status: DC

## 2017-11-07 MED ORDER — PROPOFOL 10 MG/ML IV BOLUS
INTRAVENOUS | Status: DC | PRN
  Administered 2017-11-07 (×2): 20 mg via INTRAVENOUS
  Administered 2017-11-07: 60 mg via INTRAVENOUS

## 2017-11-07 MED ORDER — LIDOCAINE HCL (CARDIAC) 20 MG/ML IV SOLN
INTRAVENOUS | Status: DC | PRN
  Administered 2017-11-07: 100 mg via INTRAVENOUS

## 2017-11-07 MED ORDER — INSULIN DETEMIR 100 UNIT/ML ~~LOC~~ SOLN
40.0000 [IU] | Freq: Every day | SUBCUTANEOUS | Status: DC
  Filled 2017-11-07: qty 0.4

## 2017-11-07 MED ORDER — INSULIN DETEMIR 100 UNIT/ML ~~LOC~~ SOLN
40.0000 [IU] | Freq: Every day | SUBCUTANEOUS | Status: DC
  Administered 2017-11-07: 40 [IU] via SUBCUTANEOUS
  Filled 2017-11-07: qty 0.4

## 2017-11-07 MED ORDER — GLYCOPYRROLATE 0.2 MG/ML IJ SOLN
INTRAMUSCULAR | Status: DC | PRN
  Administered 2017-11-07: 0.2 mg via INTRAVENOUS

## 2017-11-07 MED ORDER — IPRATROPIUM-ALBUTEROL 0.5-2.5 (3) MG/3ML IN SOLN
RESPIRATORY_TRACT | Status: AC
  Administered 2017-11-07: 3 mL
  Filled 2017-11-07: qty 3

## 2017-11-07 MED ORDER — PROPOFOL 500 MG/50ML IV EMUL
INTRAVENOUS | Status: AC
  Filled 2017-11-07: qty 50

## 2017-11-07 MED ORDER — PANTOPRAZOLE SODIUM 40 MG PO TBEC: 40.0000 mg | DELAYED_RELEASE_TABLET | Freq: Every day | ORAL | 0 refills | Status: DC

## 2017-11-07 MED ORDER — GLYCOPYRROLATE 0.2 MG/ML IJ SOLN
INTRAMUSCULAR | Status: AC
  Filled 2017-11-07: qty 1

## 2017-11-07 MED ORDER — INSULIN DETEMIR 100 UNIT/ML ~~LOC~~ SOLN: 80.0000 [IU] | Freq: Every day | SUBCUTANEOUS | 11 refills | Status: DC

## 2017-11-07 NOTE — Op Note (Signed)
[facility] Gastroenterology Patient Name: Jason Hudson Procedure Date: 11/07/2017 7:44 AM MRN: [id number] Account #: [id number] Date of Birth: February 22, 1951 Admit Type: Inpatient Age: 67 Room: [facility] ENDO ROOM 1 Gender: Male Note Status: Finalized Procedure:            Upper GI endoscopy Indications:          Melena Providers:            [name] MD, MD Referring MD:         No Local Md, MD (Referring MD) Medicines:            Monitored Anesthesia Care Complications:        No immediate complications. Procedure:            Pre-Anesthesia Assessment:                       - Prior to the procedure, a History and Physical was                        performed, and patient medications, allergies and                        sensitivities were reviewed. The patient's tolerance of                        previous anesthesia was reviewed.                       - The risks and benefits of the procedure and the                        sedation options and risks were discussed with the                        patient. All questions were answered and informed                        consent was obtained.                       - ASA Grade Assessment: III - A patient with severe                        systemic disease.                       After obtaining informed consent, the endoscope was                        passed under direct vision. Throughout the procedure,                        the patient's blood pressure, pulse, and oxygen                        saturations were monitored continuously. The Endoscope                        was introduced through the mouth, and advanced to the  third part of duodenum. The upper GI endoscopy was                        accomplished with ease. The patient tolerated the                        procedure well. Findings:      The examined duodenum was normal.      The esophagus was normal.      One non-bleeding  superficial gastric ulcer with a clean ulcer base       (Forrest Class III) was found in the gastric fundus. The lesion was 8 mm       in largest dimension.      The cardia and gastric fundus were normal on retroflexion. Impression:           - Normal examined duodenum.                       - Normal esophagus.                       - Non-bleeding gastric ulcer with a clean ulcer base                        (Forrest Class III).                       - No specimens collected. Recommendation:       - Repeat upper endoscopy in 6 weeks to check healing.                       - No ibuprofen, naproxen, or other non-steroidal                        anti-inflammatory drugs.                       - Perform a colonoscopy today.                       - 1. check H pylori stool antigen                       2. Continue Prilosec 40 mg once a day till repeat EGD                        in 6 weeks Procedure Code(s):    --- Professional ---                       [phone], Esophagogastroduodenoscopy, flexible, transoral;                        diagnostic, including collection of specimen(s) by                        brushing or washing, when performed (separate procedure) Diagnosis Code(s):    --- Professional ---                       K25.9, Gastric ulcer, unspecified as acute or chronic,  without hemorrhage or perforation                       K92.1, Melena (includes Hematochezia) CPT copyright 2016 American Medical Association. All rights reserved. The codes documented in this report are preliminary and upon coder review may  be revised to meet current compliance requirements. [name], MD [name] MD, MD 11/07/2017 7:54:07 AM This report has been signed electronically. Number of Addenda: 0 Note Initiated On: 11/07/2017 7:44 AM      [facility]

## 2017-11-07 NOTE — Transfer of Care (Signed)
Immediate Anesthesia Transfer of Care Note  [name]  Procedure(s) Performed: ESOPHAGOGASTRODUODENOSCOPY (EGD) WITH PROPOFOL (N/A ) COLONOSCOPY WITH PROPOFOL (N/A )  Patient Location: PACU  Anesthesia Type:General  Level of Consciousness: awake, alert  and oriented  Airway & Oxygen Therapy: Patient Spontanous Breathing and Patient connected to nasal cannula oxygen  Post-op Assessment: Report given to RN and Post -op Vital signs reviewed and stable  Post vital signs: Reviewed and stable  Last Vitals:  Vitals:   11/07/17 0341 11/07/17 0820  BP: 118/80   Pulse: 88 92  Resp: 17 20  Temp: 36.5 C 36.8 C  SpO2: 96% 95%    Last Pain:  Vitals:   11/07/17 0820  TempSrc: Tympanic  PainSc: 0-No pain         Complications: No apparent anesthesia complications

## 2017-11-07 NOTE — Anesthesia Procedure Notes (Signed)
Performed by: Euell Schiff, CRNA Pre-anesthesia Checklist: Patient identified, Emergency Drugs available, Suction available, Patient being monitored and Timeout performed Patient Re-evaluated:Patient Re-evaluated prior to induction Oxygen Delivery Method: Nasal cannula Induction Type: IV induction       

## 2017-11-07 NOTE — Plan of Care (Signed)
  Progressing Clinical Measurements: Ability to maintain clinical measurements within normal limits will improve 11/07/2017 1534 - Progressing by Liliane Channel, RN Will remain free from infection 11/07/2017 1534 - Progressing by Liliane Channel, RN Respiratory complications will improve 11/07/2017 1534 - Progressing by Liliane Channel, RN Pain Managment: General experience of comfort will improve 11/07/2017 1534 - Progressing by Liliane Channel, RN Safety: Ability to remain free from injury will improve 11/07/2017 1534 - Progressing by Liliane Channel, RN

## 2017-11-07 NOTE — Anesthesia Post-op Follow-up Note (Signed)
Anesthesia QCDR form completed.        

## 2017-11-07 NOTE — Progress Notes (Signed)
Pt d/c to home today.  IV removed intact.  D/c paperwork printed and reviewed w/pt.  All medication questions and concerns reviewed and pt states understanding.  All Rx's given to patient. Pt requested to wheelchair out for d/c.

## 2017-11-07 NOTE — Anesthesia Postprocedure Evaluation (Signed)
Anesthesia Post Note  [name]  Procedure(s) Performed: ESOPHAGOGASTRODUODENOSCOPY (EGD) WITH PROPOFOL (N/A ) COLONOSCOPY WITH PROPOFOL (N/A )  Patient location during evaluation: Endoscopy Anesthesia Type: General Level of consciousness: awake and alert Pain management: pain level controlled Vital Signs Assessment: post-procedure vital signs reviewed and stable Respiratory status: spontaneous breathing, nonlabored ventilation, respiratory function stable and patient connected to nasal cannula oxygen Cardiovascular status: blood pressure returned to baseline and stable Postop Assessment: no apparent nausea or vomiting Anesthetic complications: no     Last Vitals:  Vitals:   11/07/17 0900 11/07/17 0910  BP: 135/75   Pulse: 98 97  Resp: (!) 22   Temp:    SpO2: 94% 93%    Last Pain:  Vitals:   11/07/17 0820  TempSrc: Tympanic  PainSc: 0-No pain                 Celene Pippins S

## 2017-11-07 NOTE — Discharge Summary (Signed)
[facility] [place] NAME: Jason Hudson    MR#:  [id number]  DATE OF BIRTH:  April 25, 1951  DATE OF ADMISSION:  11/05/2017 ADMITTING PHYSICIAN: Amelia Jo, MD  DATE OF DISCHARGE: 11/07/2017  1:45 PM  PRIMARY CARE PHYSICIAN: [name], MD    ADMISSION DIAGNOSIS:  Bloody Stool  DISCHARGE DIAGNOSIS:  Active Problems:   Melena   SECONDARY DIAGNOSIS:   Past Medical History:  Diagnosis Date  . CHF (congestive heart failure) ([place])   . COPD (chronic obstructive pulmonary disease) ([place])   . Diabetes mellitus without complication ([place])   . Hypertension     HOSPITAL COURSE:   1.  Gastric Ulcer, melana.  Patient was recently started on Eliquis.  Noticed melena and black stools.  He stopped his Eliquis.  Patient had endoscopy today which showed a gastric ulcer.  Patient also had colonoscopy and removed 2 polyps.  Stay off aspirin and Eliquis for right now.  Protonix prescribed. 2.  Paroxysmal atrial fibrillation.  Patient currently in normal sinus rhythm.  Stroke risk higher off Eliquis.  On Coreg for rate control. 3.  Chronic diastolic congestive heart failure on Coreg, losartan and torsemide 4.  Morbid obesity 5.  Type 2 diabetes mellitus on detemir insulin.  Added short acting insulin prior to meals 6.  Essential hypertension on Norvasc, losartan and Coreg 7.  History of COPD on Dulera inhaler 8.  Chronic kidney disease stage III  DISCHARGE CONDITIONS:   Satisfactory  CONSULTS OBTAINED:  Treatment Team:  Teodoro Spray, MD  DRUG ALLERGIES:   Allergies  Allergen Reactions  . Red Dye Anaphylaxis    "not as allergic to it as I used to be. I can take a little of it"    DISCHARGE MEDICATIONS:   Allergies as of 11/07/2017      Reactions   Red Dye Anaphylaxis   "not as allergic to it as I used to be. I can take a little of it"      Medication List    STOP taking these medications   apixaban 5 MG Tabs tablet Commonly known  as:  ELIQUIS   aspirin 81 MG EC tablet   glipiZIDE 10 MG 24 hr tablet Commonly known as:  GLUCOTROL XL   hydrochlorothiazide 12.5 MG tablet Commonly known as:  HYDRODIURIL   mometasone-formoterol 100-5 MCG/ACT Aero Commonly known as:  DULERA     TAKE these medications   albuterol 108 (90 Base) MCG/ACT inhaler Commonly known as:  PROVENTIL HFA;VENTOLIN HFA Inhale 1-2 puffs into the lungs every 6 (six) hours as needed for wheezing or shortness of breath.   amLODipine 10 MG tablet Commonly known as:  NORVASC Take 10 mg by mouth daily.   atorvastatin 40 MG tablet Commonly known as:  LIPITOR Take 1 tablet (40 mg total) by mouth daily at 6 PM.   benzonatate 100 MG capsule Commonly known as:  TESSALON Take 100-200 mg by mouth 3 (three) times daily as needed for cough.   budesonide-formoterol 160-4.5 MCG/ACT inhaler Commonly known as:  SYMBICORT Inhale 2 puffs into the lungs 2 (two) times daily.   carvedilol 25 MG tablet Commonly known as:  COREG Take 0.5 tablets (12.5 mg total) by mouth 2 (two) times daily with a meal.   cholecalciferol 1000 units tablet Commonly known as:  VITAMIN D Take 1,000 Units by mouth daily.   fluticasone 50 MCG/ACT nasal spray Commonly known as:  FLONASE Place 2 sprays into both  nostrils daily.   insulin aspart 100 UNIT/ML injection Commonly known as:  novoLOG Inject 7 Units into the skin 3 (three) times daily with meals.   insulin detemir 100 UNIT/ML injection Commonly known as:  LEVEMIR Inject 0.8 mLs (80 Units total) into the skin daily. What changed:  when to take this   ipratropium 0.02 % nebulizer solution Commonly known as:  ATROVENT Take 0.5 mg by nebulization every 6 (six) hours as needed for wheezing or shortness of breath.   labetalol 100 MG tablet Commonly known as:  NORMODYNE Take 100 mg by mouth 2 (two) times daily.   loratadine 10 MG tablet Commonly known as:  CLARITIN Take 10 mg by mouth daily.   losartan 100 MG  tablet Commonly known as:  COZAAR Take 100 mg by mouth daily.   metFORMIN 1000 MG tablet Commonly known as:  GLUCOPHAGE Take 1 tablet (1,000 mg total) by mouth 2 (two) times daily with a meal.   multivitamin Tabs tablet Take 1 tablet by mouth daily.   nitroGLYCERIN 0.4 MG SL tablet Commonly known as:  NITROSTAT Place 1 tablet (0.4 mg total) under the tongue every 5 (five) minutes as needed for chest pain.   pantoprazole 40 MG tablet Commonly known as:  PROTONIX Take 1 tablet (40 mg total) by mouth daily.   potassium chloride 10 MEQ tablet Commonly known as:  K-DUR,KLOR-CON Take 1 tablet (10 mEq total) by mouth daily.   torsemide 10 MG tablet Commonly known as:  DEMADEX Take 4 tablets (40 mg total) by mouth daily. What changed:  medication strength        DISCHARGE INSTRUCTIONS:   Follow-up with PMD 1 week Follow-up Dr. Vicente Males gastroenterology in 3 weeks  If you experience worsening of your admission symptoms, develop shortness of breath, life threatening emergency, suicidal or homicidal thoughts you must seek medical attention immediately by calling 911 or calling your MD immediately  if symptoms less severe.  You Must read complete instructions/literature along with all the possible adverse reactions/side effects for all the Medicines you take and that have been prescribed to you. Take any new Medicines after you have completely understood and accept all the possible adverse reactions/side effects.   Please note  You were cared for by a hospitalist during your hospital stay. If you have any questions about your discharge medications or the care you received while you were in the hospital after you are discharged, you can call the unit and asked to speak with the hospitalist on call if the hospitalist that took care of you is not available. Once you are discharged, your primary care physician will handle any further medical issues. Please note that NO REFILLS for any  discharge medications will be authorized once you are discharged, as it is imperative that you return to your primary care physician (or establish a relationship with a primary care physician if you do not have one) for your aftercare needs so that they can reassess your need for medications and monitor your lab values.    Today   CHIEF COMPLAINT:   Chief Complaint  Patient presents with  . Rectal Bleeding    HISTORY OF PRESENT ILLNESS:  Jason Hudson  is a 67 y.o. male came in with rectal bleeding   VITAL SIGNS:  Blood pressure 116/66, pulse 96, temperature 98.2 F (36.8 C), temperature source Tympanic, resp. rate (!) 22, height 5\' 8"  (1.727 m), weight (!) 140.9 kg (310 lb 11.2 oz), SpO2 97 %.  PHYSICAL EXAMINATION:  GENERAL:  67 y.o.-year-old patient lying in the bed with no acute distress.  EYES: Pupils equal, round, reactive to light and accommodation. No scleral icterus. Extraocular muscles intact.  HEENT: Head atraumatic, normocephalic. Oropharynx and nasopharynx clear.  NECK:  Supple, no jugular venous distention. No thyroid enlargement, no tenderness.  LUNGS: Normal breath sounds bilaterally, no wheezing, rales,rhonchi or crepitation. No use of accessory muscles of respiration.  CARDIOVASCULAR: S1, S2 normal. No murmurs, rubs, or gallops.  ABDOMEN: Soft, non-tender, non-distended. Bowel sounds present. No organomegaly or mass.  EXTREMITIES: 2+ edema, cyanosis, or clubbing.  NEUROLOGIC: Cranial nerves II through XII are intact. Muscle strength 5/5 in all extremities. Sensation intact. Gait not checked.  PSYCHIATRIC: The patient is alert and oriented x 3.  SKIN: No obvious rash, lesion, or ulcer.   DATA REVIEW:   CBC Recent Labs  Lab 11/07/17 0551  WBC 15.3*  HGB 12.6*  HCT 38.0*  PLT 261    Chemistries  Recent Labs  Lab 11/05/17 1735 11/06/17 0424  NA 137 138  K 3.4* 3.5  CL 92* 96*  CO2 34* 31  GLUCOSE 232* 186*  BUN 22* 22*  CREATININE 1.59*  1.40*  CALCIUM 9.3 9.2  AST 40  --   ALT 38  --   ALKPHOS 67  --   BILITOT 0.4  --       Management plans discussed with the patient, family and they are in agreement.  CODE STATUS:     Code Status Orders  (From admission, onward)        Start     Ordered   11/06/17 0006  Full code  Continuous     11/06/17 0005    Code Status History    Date Active Date Inactive Code Status Order ID Comments User Context   10/20/2017 12:14 10/23/2017 17:47 Full Code [id number]  Gorden Harms, MD ED   07/27/2016 15:15 07/29/2016 16:01 Full Code [id number]  Max Sane, MD ED    Advance Directive Documentation     Most Recent Value  Type of Advance Directive  Living will  Pre-existing out of facility DNR order (yellow form or pink MOST form)  No data  "MOST" Form in Place?  No data      TOTAL TIME TAKING CARE OF THIS PATIENT: 35 minutes.    Loletha Grayer M.D on 11/07/2017 at 2:35 PM  Between 7am to 6pm - Pager - [phone]  After 6pm go to [email or web address] - password [email or web address]  [employer] Office  [phone]  CC: Primary care physician; [name], MD

## 2017-11-07 NOTE — Op Note (Signed)
[facility] Gastroenterology Patient Name: Jason Hudson Procedure Date: 11/07/2017 7:43 AM MRN: [id number] Account #: [id number] Date of Birth: 10-Jul-1951 Admit Type: Inpatient Age: 67 Room: [facility] ENDO ROOM 1 Gender: Male Note Status: Finalized Procedure:            Colonoscopy Indications:          Rectal bleeding Providers:            [name] MD, MD Referring MD:         No Local Md, MD (Referring MD) Medicines:            Monitored Anesthesia Care Complications:        No immediate complications. Procedure:            Pre-Anesthesia Assessment:                       - Prior to the procedure, a History and Physical was                        performed, and patient medications, allergies and                        sensitivities were reviewed. The patient's tolerance of                        previous anesthesia was reviewed.                       - The risks and benefits of the procedure and the                        sedation options and risks were discussed with the                        patient. All questions were answered and informed                        consent was obtained.                       - ASA Grade Assessment: III - A patient with severe                        systemic disease.                       After obtaining informed consent, the colonoscope was                        passed under direct vision. Throughout the procedure,                        the patient's blood pressure, pulse, and oxygen                        saturations were monitored continuously. The                        Colonoscope was introduced through the anus and                        advanced  to the the cecum, identified by the                        appendiceal orifice, IC valve and transillumination.                        The colonoscopy was performed with ease. The patient                        tolerated the procedure well. The quality of the bowel             preparation was good. Findings:      Multiple small-mouthed diverticula were found in the sigmoid colon.      Two sessile polyps were found in the ascending colon and cecum. The       polyps were 4 to 6 mm in size. These polyps were removed with a cold       snare. Resection and retrieval were complete.      The exam was otherwise without abnormality on direct and retroflexion       views. Impression:           - Diverticulosis in the sigmoid colon.                       - Two 4 to 6 mm polyps in the ascending colon and in                        the cecum, removed with a cold snare. Resected and                        retrieved.                       - The examination was otherwise normal on direct and                        retroflexion views. Recommendation:       - [Diet Recommendation] [Duration].                       - Return patient to hospital ward for ongoing care.                       - Advance diet as tolerated.                       - Await pathology results.                       - Repeat colonoscopy in 5 years for surveillance.                       - Can be discharged from GI point of view , avoid all                        NSAID's due to gastric ulcer. Procedure Code(s):    --- Professional ---                       [phone], Colonoscopy, flexible; with removal of tumor(s),  polyp(s), or other lesion(s) by snare technique Diagnosis Code(s):    --- Professional ---                       D12.2, Benign neoplasm of ascending colon                       D12.0, Benign neoplasm of cecum                       K62.5, Hemorrhage of anus and rectum                       K57.30, Diverticulosis of large intestine without                        perforation or abscess without bleeding CPT copyright 2016 American Medical Association. All rights reserved. The codes documented in this report are preliminary and upon coder review may  be revised to meet  current compliance requirements. [name], MD [name] MD, MD 11/07/2017 8:19:09 AM This report has been signed electronically. Number of Addenda: 0 Note Initiated On: 11/07/2017 7:43 AM Scope Withdrawal Time: 0 hours 16 minutes 54 seconds  Total Procedure Duration: 0 hours 19 minutes 42 seconds       [facility]

## 2017-11-07 NOTE — H&P (Signed)
Jason Bellows, MD [address], [place], [place], [place], [address] [address], [place], [place], [place], [address] Phone: [phone]  Fax: [phone]  Primary Care Physician:  [name], MD   Pre-Procedure History & Physical: HPI:  Jason Hudson is a 67 y.o. male is here for an endoscopy and colonoscopy    Past Medical History:  Diagnosis Date  . CHF (congestive heart failure) ([place])   . COPD (chronic obstructive pulmonary disease) ([place])   . Diabetes mellitus without complication ([place])   . Hypertension     Past Surgical History:  Procedure Laterality Date  . LEFT HEART CATH AND CORONARY ANGIOGRAPHY N/A 10/23/2017   Procedure: LEFT HEART CATH AND CORONARY ANGIOGRAPHY;  Surgeon: Isaias Cowman, MD;  Location: [place] CV LAB;  Service: Cardiovascular;  Laterality: N/A;    Prior to Admission medications   Medication Sig Start Date End Date Taking? Authorizing Provider  albuterol (PROVENTIL HFA;VENTOLIN HFA) 108 (90 Base) MCG/ACT inhaler Inhale 1-2 puffs into the lungs every 6 (six) hours as needed for wheezing or shortness of breath.   Yes [provider]  amLODipine (NORVASC) 10 MG tablet Take 10 mg by mouth daily.   Yes [provider]  aspirin EC 81 MG EC tablet Take 1 tablet (81 mg total) by mouth daily. 10/23/17  Yes Mody, Ulice Bold, MD  budesonide-formoterol (SYMBICORT) 160-4.5 MCG/ACT inhaler Inhale 2 puffs into the lungs 2 (two) times daily.   Yes [provider]  carvedilol (COREG) 25 MG tablet Take 1 tablet (25 mg total) by mouth 2 (two) times daily with a meal. Patient taking differently: Take 12.5 mg by mouth 2 (two) times daily with a meal.  10/23/17  Yes Mody, Sital, MD  cholecalciferol (VITAMIN D) 1000 units tablet Take 1,000 Units by mouth daily.   Yes [provider]  fluticasone (FLONASE) 50 MCG/ACT nasal spray Place 2 sprays into both nostrils daily.   Yes [provider]  glipiZIDE (GLUCOTROL  XL) 10 MG 24 hr tablet Take 10 mg by mouth daily with breakfast.   Yes [provider]  hydrochlorothiazide (HYDRODIURIL) 12.5 MG tablet Take 1 tablet by mouth daily.   Yes [provider]  insulin detemir (LEVEMIR) 100 UNIT/ML injection Inject 80 Units into the skin 2 (two) times daily.   Yes [provider]  ipratropium (ATROVENT) 0.02 % nebulizer solution Take 0.5 mg by nebulization every 6 (six) hours as needed for wheezing or shortness of breath.   Yes [provider]  labetalol (NORMODYNE) 100 MG tablet Take 100 mg by mouth 2 (two) times daily.   Yes [provider]  loratadine (CLARITIN) 10 MG tablet Take 10 mg by mouth daily.   Yes [provider]  losartan (COZAAR) 100 MG tablet Take 100 mg by mouth daily.   Yes [provider]  metFORMIN (GLUCOPHAGE) 1000 MG tablet Take 1 tablet (1,000 mg total) by mouth 2 (two) times daily with a meal. 07/29/16  Yes [name], MD  mometasone-formoterol (DULERA) 100-5 MCG/ACT AERO Inhale 2 puffs into the lungs 2 (two) times daily.   Yes [provider]  multivitamin (ONE-A-DAY MEN'S) TABS tablet Take 1 tablet by mouth daily.   Yes [provider]  nitroGLYCERIN (NITROSTAT) 0.4 MG SL tablet Place 1 tablet (0.4 mg total) under the tongue every 5 (five) minutes as needed for chest pain. 10/23/17  Yes Mody, Ulice Bold, MD  potassium chloride (K-DUR,KLOR-CON) 10 MEQ tablet Take 1 tablet (10  mEq total) by mouth daily. 10/23/17  Yes Mody, Ulice Bold, MD  torsemide (DEMADEX) 20 MG tablet Take 2 tablets (40 mg total) by mouth daily. 10/23/17  Yes Mody, Ulice Bold, MD  apixaban (ELIQUIS) 5 MG TABS tablet Take 1 tablet (5 mg total) by mouth 2 (two) times daily. Patient not taking: Reported on 11/05/2017 10/24/17   Bettey Costa, MD  atorvastatin (LIPITOR) 40 MG tablet Take 1 tablet (40 mg total) by mouth daily at 6 PM. Patient not taking: Reported on 11/05/2017 10/23/17   Bettey Costa, MD  benzonatate (TESSALON)  100 MG capsule Take 100-200 mg by mouth 3 (three) times daily as needed for cough.    [provider]    Allergies as of 11/05/2017 - Review Complete 11/05/2017  Allergen Reaction Noted  . Red dye Anaphylaxis 07/27/2016    History reviewed. No pertinent family history.  Social History   Socioeconomic History  . Marital status: Single    Spouse name: Not on file  . Number of children: Not on file  . Years of education: Not on file  . Highest education level: Not on file  Social Needs  . Financial resource strain: Not on file  . Food insecurity - worry: Not on file  . Food insecurity - inability: Not on file  . Transportation needs - medical: Not on file  . Transportation needs - non-medical: Not on file  Occupational History  . Not on file  Tobacco Use  . Smoking status: Never Smoker  . Smokeless tobacco: Never Used  Substance and Sexual Activity  . Alcohol use: No  . Drug use: Not on file  . Sexual activity: Not on file  Other Topics Concern  . Not on file  Social History Narrative  . Not on file    Review of Systems: See HPI, otherwise negative ROS  Physical Exam: BP 118/80 (BP Location: Right Arm)   Pulse 88   Temp 97.7 F (36.5 C) (Oral)   Resp 17   Ht 5\' 8"  (1.727 m)   Wt (!) 310 lb 11.2 oz (140.9 kg)   SpO2 96%   BMI 47.24 kg/m  General:   Alert,  pleasant and cooperative in NAD Head:  Normocephalic and atraumatic. Neck:  Supple; no masses or thyromegaly. Lungs:  Clear throughout to auscultation, normal respiratory effort.    Heart:  +S1, +S2, Regular rate and rhythm, No edema. Abdomen:  Soft, nontender and nondistended. Normal bowel sounds, without guarding, and without rebound.   Neurologic:  Alert and  oriented x4;  grossly normal neurologically.  Impression/Plan: Jason Hudson is here for an endoscopy and colonoscopy  to be performed for  evaluation of gi bleed    Risks, benefits, limitations, and alternatives regarding  endoscopy have been reviewed with the patient.  Questions have been answered.  All parties agreeable.   Jason Bellows, MD  11/07/2017, 7:30 AM

## 2017-11-07 NOTE — Progress Notes (Signed)
EGD- non bleeding superficial gastric ulcer  Colonoscopy - 2 small polyps taken out  No active bleeding anywhere  Plan  1. Advance diet to regular  2. PPI daily 3. Stop chronic nsaid use 4. Stool H pylori antigen  5. Repeat EGD in 6 weeks   I will sign off.  Please call me if any further GI concerns or questions.  We would like to thank you for the opportunity to participate in the care of [employer].     Dr [name] MD,MRCP [facility]) Gastroenterology/Hepatology Pager: [phone]

## 2017-11-07 NOTE — Anesthesia Preprocedure Evaluation (Addendum)
Anesthesia Evaluation  Patient identified by MRN, date of birth, ID band Patient awake    Reviewed: Allergy & Precautions, NPO status , Patient's Chart, lab work & pertinent test results, reviewed documented beta blocker date and time   Airway Mallampati: III  TM Distance: >3 FB     Dental  (+) Chipped, Upper Dentures, Missing   Pulmonary pneumonia, resolved, COPD,           Cardiovascular hypertension, Pt. on medications and Pt. on home beta blockers +CHF       Neuro/Psych    GI/Hepatic   Endo/Other  diabetes, Type 2  Renal/GU      Musculoskeletal   Abdominal   Peds  Hematology   Anesthesia Other Findings Will give SVN this am. No cardiac stents.No NTG.  Reproductive/Obstetrics                            Anesthesia Physical Anesthesia Plan  ASA: III  Anesthesia Plan: General   Post-op Pain Management:    Induction: Intravenous  PONV Risk Score and Plan:   Airway Management Planned:   Additional Equipment:   Intra-op Plan:   Post-operative Plan:   Informed Consent: I have reviewed the patients History and Physical, chart, labs and discussed the procedure including the risks, benefits and alternatives for the proposed anesthesia with the patient or authorized representative who has indicated his/her understanding and acceptance.     Plan Discussed with: CRNA  Anesthesia Plan Comments:         Anesthesia Quick Evaluation

## 2017-11-08 ENCOUNTER — Encounter: Payer: Self-pay | Admitting: Gastroenterology

## 2017-11-09 ENCOUNTER — Ambulatory Visit: Payer: Managed Care, Other (non HMO) | Admitting: Family

## 2017-11-09 LAB — SURGICAL PATHOLOGY

## 2017-11-12 ENCOUNTER — Encounter: Payer: Self-pay | Admitting: Gastroenterology

## 2017-12-06 ENCOUNTER — Ambulatory Visit: Payer: Medicare HMO | Admitting: Gastroenterology

## 2017-12-07 ENCOUNTER — Ambulatory Visit: Payer: Medicare HMO | Admitting: Gastroenterology

## 2017-12-15 DIAGNOSIS — G4733 Obstructive sleep apnea (adult) (pediatric): Secondary | ICD-10-CM | POA: Insufficient documentation

## 2017-12-15 DIAGNOSIS — J449 Chronic obstructive pulmonary disease, unspecified: Secondary | ICD-10-CM | POA: Insufficient documentation

## 2017-12-15 DIAGNOSIS — K219 Gastro-esophageal reflux disease without esophagitis: Secondary | ICD-10-CM

## 2017-12-15 HISTORY — DX: Gastro-esophageal reflux disease without esophagitis: K21.9

## 2017-12-27 ENCOUNTER — Ambulatory Visit: Payer: Medicare HMO | Admitting: Gastroenterology

## 2018-01-04 ENCOUNTER — Ambulatory Visit: Payer: Self-pay | Admitting: Surgery

## 2018-01-19 DIAGNOSIS — I10 Essential (primary) hypertension: Secondary | ICD-10-CM | POA: Insufficient documentation

## 2018-01-19 DIAGNOSIS — E1159 Type 2 diabetes mellitus with other circulatory complications: Secondary | ICD-10-CM

## 2018-01-22 ENCOUNTER — Ambulatory Visit: Payer: Self-pay | Admitting: Surgery

## 2018-01-24 ENCOUNTER — Ambulatory Visit: Payer: Medicare HMO | Admitting: Gastroenterology

## 2018-02-06 ENCOUNTER — Emergency Department: Payer: Managed Care, Other (non HMO)

## 2018-02-06 ENCOUNTER — Other Ambulatory Visit: Payer: Self-pay

## 2018-02-06 ENCOUNTER — Encounter: Payer: Self-pay | Admitting: Emergency Medicine

## 2018-02-06 ENCOUNTER — Emergency Department
Admission: EM | Admit: 2018-02-06 | Discharge: 2018-02-06 | Disposition: A | Discharge status: Home / Self Care | Payer: Managed Care, Other (non HMO) | Attending: Emergency Medicine | Admitting: Emergency Medicine

## 2018-02-06 DIAGNOSIS — R0602 Shortness of breath: Secondary | ICD-10-CM | POA: Diagnosis present

## 2018-02-06 DIAGNOSIS — E119 Type 2 diabetes mellitus without complications: Secondary | ICD-10-CM | POA: Diagnosis not present

## 2018-02-06 DIAGNOSIS — J449 Chronic obstructive pulmonary disease, unspecified: Secondary | ICD-10-CM | POA: Diagnosis not present

## 2018-02-06 DIAGNOSIS — I110 Hypertensive heart disease with heart failure: Secondary | ICD-10-CM | POA: Insufficient documentation

## 2018-02-06 DIAGNOSIS — R0600 Dyspnea, unspecified: Secondary | ICD-10-CM

## 2018-02-06 DIAGNOSIS — I509 Heart failure, unspecified: Secondary | ICD-10-CM | POA: Diagnosis not present

## 2018-02-06 LAB — CBC
HEMATOCRIT: 36.4 % — AB (ref 40.0–52.0)
HEMOGLOBIN: 12 g/dL — AB (ref 13.0–18.0)
MCH: 29.8 pg (ref 26.0–34.0)
MCHC: 33 g/dL (ref 32.0–36.0)
MCV: 90.3 fL (ref 80.0–100.0)
Platelets: 272 10*3/uL (ref 150–440)
RBC: 4.03 MIL/uL — ABNORMAL LOW (ref 4.40–5.90)
RDW: 15.3 % — AB (ref 11.5–14.5)
WBC: 19.9 10*3/uL — ABNORMAL HIGH (ref 3.8–10.6)

## 2018-02-06 LAB — BASIC METABOLIC PANEL
ANION GAP: 13 (ref 5–15)
BUN: 22 mg/dL — AB (ref 6–20)
CHLORIDE: 96 mmol/L — AB (ref 101–111)
CO2: 27 mmol/L (ref 22–32)
Calcium: 9.1 mg/dL (ref 8.9–10.3)
Creatinine, Ser: 1.38 mg/dL — ABNORMAL HIGH (ref 0.61–1.24)
GFR calc Af Amer: >60 mL/min (ref >60)
GFR calc non Af Amer: 52 mL/min — ABNORMAL LOW (ref >60)
GLUCOSE: 177 mg/dL — AB (ref 65–99)
POTASSIUM: 3.6 mmol/L (ref 3.5–5.1)
Sodium: 136 mmol/L (ref 135–145)

## 2018-02-06 LAB — BRAIN NATRIURETIC PEPTIDE: B NATRIURETIC PEPTIDE 5: 65 pg/mL (ref 0.0–100.0)

## 2018-02-06 LAB — TROPONIN I: Troponin I: 0.03 ng/mL (ref <0.03)

## 2018-02-06 MED ORDER — IPRATROPIUM-ALBUTEROL 0.5-2.5 (3) MG/3ML IN SOLN
3.0000 mL | Freq: Once | RESPIRATORY_TRACT | Status: AC
  Administered 2018-02-06: 3 mL via RESPIRATORY_TRACT
  Filled 2018-02-06: qty 3

## 2018-02-06 MED ORDER — PREDNISONE 10 MG (21) PO TBPK: ORAL_TABLET | Freq: Every day | ORAL | 0 refills | Status: DC

## 2018-02-06 MED ORDER — FUROSEMIDE 10 MG/ML IJ SOLN
120.0000 mg | Freq: Once | INTRAVENOUS | Status: AC
  Administered 2018-02-06: 120 mg via INTRAVENOUS
  Filled 2018-02-06: qty 12

## 2018-02-06 MED ORDER — IOPAMIDOL (ISOVUE-370) INJECTION 76%
100.0000 mL | Freq: Once | INTRAVENOUS | Status: AC | PRN
  Administered 2018-02-06: 100 mL via INTRAVENOUS

## 2018-02-06 MED ORDER — PREDNISONE 20 MG PO TABS
60.0000 mg | ORAL_TABLET | Freq: Once | ORAL | Status: AC
  Administered 2018-02-06: 60 mg via ORAL
  Filled 2018-02-06: qty 6

## 2018-02-06 NOTE — ED Provider Notes (Signed)
IMPRESSION: 1. No evidence of pulmonary emboli or thoracic aortic aneurysm. 2. Mild nonspecific bilateral ground-glass opacities-favor small vessel or small airway disease but mild interstitial edema is not excluded. 3. Equivocal nodularity of the hepatic contour which could represent cirrhosis. 4. Cardiomegaly and coronary artery disease.  No evidence of PE.  He has diuresed well in the ER and currently feels well enough to go home.  He will be on steroids for COPD and for sinusitis.  He is cleared for outpatient follow-up.   Earleen Newport, MD 02/06/18 [phone]

## 2018-02-06 NOTE — ED Triage Notes (Signed)
Pt c/o increased SOB over the past couple of days with a HX of COPD and states it is the COPD flaring up. Pt has done a neb tx and used his inhaler today. Denies any pain.

## 2018-02-06 NOTE — ED Provider Notes (Signed)
[facility] Emergency Department Provider Note       Time seen: ----------------------------------------- 11:11 AM on 02/06/2018 -----------------------------------------   I have reviewed the triage vital signs and the nursing notes.  HISTORY   Chief Complaint Shortness of Breath    HPI Jason Hudson is a 67 y.o. male with a history of CHF, COPD, diabetes and hypertension who presents to the ED for increasing shortness of breath over the past week.  Patient states he had to sleep in a chair in order to breathe.  He can walk but he is short of breath with any ambulation.  He denies fevers or chills, has some chest pressure and extremity edema.  Past Medical History:  Diagnosis Date  . CHF (congestive heart failure) ([place])   . COPD (chronic obstructive pulmonary disease) ([place])   . Diabetes mellitus without complication ([place])   . Hypertension   . Melena 11/05/2017    Patient Active Problem List   Diagnosis Date Noted  . Hypertension 01/19/2018  . Melena 11/05/2017  . CHF (congestive heart failure) ([place]) 10/20/2017  . Pneumonia 07/27/2016    Past Surgical History:  Procedure Laterality Date  . COLONOSCOPY WITH PROPOFOL N/A 11/07/2017   Procedure: COLONOSCOPY WITH PROPOFOL;  Surgeon: [name], MD;  Location: [facility] ENDOSCOPY;  Service: Gastroenterology;  Laterality: N/A;  . ESOPHAGOGASTRODUODENOSCOPY (EGD) WITH PROPOFOL N/A 11/07/2017   Procedure: ESOPHAGOGASTRODUODENOSCOPY (EGD) WITH PROPOFOL;  Surgeon: [name], MD;  Location: [facility] ENDOSCOPY;  Service: Gastroenterology;  Laterality: N/A;  . LEFT HEART CATH AND CORONARY ANGIOGRAPHY N/A 10/23/2017   Procedure: LEFT HEART CATH AND CORONARY ANGIOGRAPHY;  Surgeon: Isaias Cowman, MD;  Location: [place] CV LAB;  Service: Cardiovascular;  Laterality: N/A;    Allergies Red dye  Social History Social History   Tobacco Use  . Smoking status: Never Smoker  . Smokeless tobacco:  Never Used  Substance Use Topics  . Alcohol use: No  . Drug use: Not on file   Review of Systems Constitutional: Negative for fever. Cardiovascular: Positive for chest pressure Respiratory: Positive for shortness of breath Gastrointestinal: Negative for abdominal pain, vomiting and diarrhea. Musculoskeletal: Negative for back pain.  Positive for peripheral edema Skin: Negative for rash. Neurological: Negative for headaches, focal weakness or numbness.  All systems negative/normal/unremarkable except as stated in the HPI  ____________________________________________   PHYSICAL EXAM:  VITAL SIGNS: ED Triage Vitals  Enc Vitals Group     BP 02/06/18 1053 (!) 155/80     Pulse Rate 02/06/18 1053 86     Resp 02/06/18 1053 20     Temp 02/06/18 1053 98.1 F (36.7 C)     Temp Source 02/06/18 1053 Oral     SpO2 02/06/18 1053 95 %     Weight 02/06/18 1052 (!) 330 lb (149.7 kg)     Height 02/06/18 1052 5\' 9"  (1.753 m)     Head Circumference --      Peak Flow --      Pain Score 02/06/18 1052 6     Pain Loc --      Pain Edu? --      Excl. in [place]? --    Constitutional: Alert and oriented.  Obese, mild distress Eyes: Conjunctivae are normal. Normal extraocular movements. ENT   Head: Normocephalic and atraumatic.   Nose: No congestion/rhinnorhea.   Mouth/Throat: Mucous membranes are moist.   Neck: No stridor. Cardiovascular: Normal rate, regular rhythm. No murmurs, rubs, or gallops. Respiratory: Mild tachypnea, breath sounds are  clear and equal bilaterally. No wheezes/rales/rhonchi. Gastrointestinal: Soft and nontender. Normal bowel sounds Musculoskeletal: Nontender with normal range of motion in extremities.  Bilateral extensive pitting edema is noted Neurologic:  Normal speech and language. No gross focal neurologic deficits are appreciated.  Skin:  Skin is warm, dry and intact. No rash noted. Psychiatric: Mood and affect are normal. Speech and behavior are normal.   ____________________________________________  EKG: Interpreted by me.  Sinus rhythm rate 86 bpm, normal PR interval, normal QRS, normal QT.  ____________________________________________  ED COURSE:  As part of my medical decision making, I reviewed the following data within the [place] History obtained from family if available, nursing notes, old chart and ekg, as well as notes from prior ED visits. Patient presented for dyspnea and likely CHF exacerbation, we will assess with labs and imaging as indicated at this time. Clinical Course as of Feb 06 1441  Tue Feb 06, 2018  1436 Patient with persistent shortness of breath, unclear as to the exact etiology, I will order CT angiogram of the chest.   [JW]    Clinical Course User Index [JW] Earleen Newport, MD   Procedures ____________________________________________   LABS (pertinent positives/negatives)  Labs Reviewed  BASIC METABOLIC PANEL - Abnormal; Notable for the following components:      Result Value   Chloride 96 (*)    Glucose, Bld 177 (*)    BUN 22 (*)    Creatinine, Ser 1.38 (*)    GFR calc non Af Amer 52 (*)    All other components within normal limits  CBC - Abnormal; Notable for the following components:   WBC 19.9 (*)    RBC 4.03 (*)    Hemoglobin 12.0 (*)    HCT 36.4 (*)    RDW 15.3 (*)    All other components within normal limits  TROPONIN I - Abnormal; Notable for the following components:   Troponin I 0.03 (*)    All other components within normal limits  BRAIN NATRIURETIC PEPTIDE    RADIOLOGY Images were viewed by me  Chest x-ray IMPRESSION: Slight scarring left base. No edema or consolidation. Stable cardiac silhouette. ____________________________________________  DIFFERENTIAL DIAGNOSIS   CHF, COPD, pneumonia, MI  FINAL ASSESSMENT AND PLAN  Dyspnea   Plan: The patient had presented for shortness of breath of uncertain etiology. Patient's labs were grossly  within normal limits for him although his white count was higher than normal. Patient's imaging initially did not reveal any acute process.  CT angiogram of the chest has been ordered.  We did give him IV Lasix as well as DuoNeb and steroids without significant change in his dyspnea.   Laurence Aly, MD   Note: This note was generated in part or whole with voice recognition software. Voice recognition is usually quite accurate but there are transcription errors that can and very often do occur. I apologize for any typographical errors that were not detected and corrected.     Earleen Newport, MD 02/06/18 [phone]

## 2018-02-06 NOTE — ED Notes (Signed)
Pt returned from CT °

## 2018-02-06 NOTE — ED Notes (Signed)
Patient transported to CT 

## 2018-03-05 ENCOUNTER — Ambulatory Visit: Payer: Self-pay | Admitting: Surgery

## 2018-03-07 ENCOUNTER — Emergency Department: Payer: Medicare HMO

## 2018-03-07 ENCOUNTER — Other Ambulatory Visit: Payer: Self-pay

## 2018-03-07 ENCOUNTER — Emergency Department
Admission: EM | Admit: 2018-03-07 | Discharge: 2018-03-07 | Disposition: A | Discharge status: Home / Self Care | Payer: Medicare HMO | Attending: Emergency Medicine | Admitting: Emergency Medicine

## 2018-03-07 ENCOUNTER — Encounter: Payer: Self-pay | Admitting: *Deleted

## 2018-03-07 DIAGNOSIS — I509 Heart failure, unspecified: Secondary | ICD-10-CM | POA: Diagnosis not present

## 2018-03-07 DIAGNOSIS — E119 Type 2 diabetes mellitus without complications: Secondary | ICD-10-CM | POA: Insufficient documentation

## 2018-03-07 DIAGNOSIS — J449 Chronic obstructive pulmonary disease, unspecified: Secondary | ICD-10-CM | POA: Diagnosis not present

## 2018-03-07 DIAGNOSIS — Z79899 Other long term (current) drug therapy: Secondary | ICD-10-CM | POA: Insufficient documentation

## 2018-03-07 DIAGNOSIS — Z794 Long term (current) use of insulin: Secondary | ICD-10-CM | POA: Diagnosis not present

## 2018-03-07 DIAGNOSIS — I110 Hypertensive heart disease with heart failure: Secondary | ICD-10-CM | POA: Insufficient documentation

## 2018-03-07 DIAGNOSIS — R0602 Shortness of breath: Secondary | ICD-10-CM

## 2018-03-07 LAB — CBC
HCT: 38 % — ABNORMAL LOW (ref 40.0–52.0)
HEMOGLOBIN: 12.8 g/dL — AB (ref 13.0–18.0)
MCH: 29.7 pg (ref 26.0–34.0)
MCHC: 33.8 g/dL (ref 32.0–36.0)
MCV: 87.9 fL (ref 80.0–100.0)
Platelets: 267 10*3/uL (ref 150–440)
RBC: 4.32 MIL/uL — ABNORMAL LOW (ref 4.40–5.90)
RDW: 15.3 % — ABNORMAL HIGH (ref 11.5–14.5)
WBC: 12.7 10*3/uL — AB (ref 3.8–10.6)

## 2018-03-07 LAB — BASIC METABOLIC PANEL
Anion gap: 14 (ref 5–15)
BUN: 14 mg/dL (ref 8–23)
CO2: 26 mmol/L (ref 22–32)
CREATININE: 1.57 mg/dL — AB (ref 0.61–1.24)
Calcium: 8.8 mg/dL — ABNORMAL LOW (ref 8.9–10.3)
Chloride: 96 mmol/L — ABNORMAL LOW (ref 98–111)
GFR calc Af Amer: 52 mL/min — ABNORMAL LOW (ref >60)
GFR, EST NON AFRICAN AMERICAN: 45 mL/min — AB (ref >60)
Glucose, Bld: 246 mg/dL — ABNORMAL HIGH (ref 70–99)
Potassium: 3.6 mmol/L (ref 3.5–5.1)
SODIUM: 136 mmol/L (ref 135–145)

## 2018-03-07 LAB — TROPONIN I
TROPONIN I: 0.04 ng/mL — AB (ref <0.03)
Troponin I: 0.04 ng/mL (ref <0.03)

## 2018-03-07 LAB — GLUCOSE, CAPILLARY: Glucose-Capillary: 236 mg/dL — ABNORMAL HIGH (ref 70–99)

## 2018-03-07 MED ORDER — FUROSEMIDE 10 MG/ML IJ SOLN
40.0000 mg | Freq: Once | INTRAMUSCULAR | Status: AC
  Administered 2018-03-07: 40 mg via INTRAVENOUS
  Filled 2018-03-07: qty 4

## 2018-03-07 NOTE — ED Triage Notes (Addendum)
Pt to ED reporting SOB and generalized chest pains that have been worsening throughout the week. Pt is diaphoretic upon arrival and has noted increased WOB after walking through the parking lot. Nonproductive cough this week as well as "explosive diarrhea"  Mild abd pain that eases off after having each BM. No nausea or vomiting. Pt has hx of COPD and CHF and reports having taken his medications normally and felt as though he was not retaining fluid. Mild Edema bilateral both lower extremities.  w Vitals signs with EMS  164/88 86 HR 94% RA CBG 357

## 2018-03-07 NOTE — ED Provider Notes (Signed)
[facility] Emergency Department Provider Note ____________________________________________   First MD Initiated Contact with Patient 03/07/18 1526     (approximate)  I have reviewed the triage vital signs and the nursing notes.   HISTORY  Chief Complaint Chest Pain and Shortness of Breath  HPI Jason Hudson is a 67 y.o. male with a history of CHF, COPD and diabetes who is presenting with 1 week of shortness of breath upon exertion.  Says that he has diffuse body aches but is denying any chest pain at this time.  Says that he has not had cough, fever or sputum production.  Has been taking his torsemide once a day.  Was here several weeks ago with a similar issue and was given diuretics and felt better thereafter.  Says that he is having slightly more swelling to his bilateral lower extremities than normal.  Patient also says over the past 3 to 4 days he has been having diarrhea.  Says that he has about 3 episodes per day.  Denies any blood in the stool.  Denies any recent antibiotics. Past Medical History:  Diagnosis Date  . CHF (congestive heart failure) ([place])   . COPD (chronic obstructive pulmonary disease) ([place])   . Diabetes mellitus without complication ([place])   . Hypertension   . Melena 11/05/2017  . Pneumonia 07/27/2016    Patient Active Problem List   Diagnosis Date Noted  . Hypertension 01/19/2018  . Chronic obstructive lung disease ([place]) 12/15/2017  . Gastroesophageal reflux disease 12/15/2017  . Obstructive sleep apnea of adult 12/15/2017  . Melena 11/05/2017  . CHF (congestive heart failure) ([place]) 10/20/2017  . Pneumonia 07/27/2016    Past Surgical History:  Procedure Laterality Date  . COLONOSCOPY WITH PROPOFOL N/A 11/07/2017   Procedure: COLONOSCOPY WITH PROPOFOL;  Surgeon: [name], MD;  Location: [facility] ENDOSCOPY;  Service: Gastroenterology;  Laterality: N/A;  . ESOPHAGOGASTRODUODENOSCOPY (EGD) WITH PROPOFOL N/A 11/07/2017   Procedure: ESOPHAGOGASTRODUODENOSCOPY (EGD) WITH PROPOFOL;  Surgeon: [name], MD;  Location: [facility] ENDOSCOPY;  Service: Gastroenterology;  Laterality: N/A;  . LEFT HEART CATH AND CORONARY ANGIOGRAPHY N/A 10/23/2017   Procedure: LEFT HEART CATH AND CORONARY ANGIOGRAPHY;  Surgeon: Isaias Cowman, MD;  Location: [facility] CV LAB;  Service: Cardiovascular;  Laterality: N/A;    Prior to Admission medications   Medication Sig Start Date End Date Taking? Authorizing Provider  albuterol (PROVENTIL HFA;VENTOLIN HFA) 108 (90 Base) MCG/ACT inhaler Inhale 1-2 puffs into the lungs every 6 (six) hours as needed for wheezing or shortness of breath.   Yes [provider]  amLODipine (NORVASC) 10 MG tablet Take 10 mg by mouth daily.   Yes [provider]  benzonatate (TESSALON) 100 MG capsule Take 100 mg by mouth 2 (two) times daily.    Yes [provider]  bifidobacterium infantis (ALIGN) capsule Take 1 capsule by mouth daily.   Yes [provider]  budesonide-formoterol (SYMBICORT) 160-4.5 MCG/ACT inhaler Inhale 2 puffs into the lungs 2 (two) times daily.   Yes [provider]  carvedilol (COREG) 12.5 MG tablet Take 12.5 mg by mouth daily.   Yes [provider]  Chlorphen-Phenyleph-ASA (ALKA-SELTZER PLUS COLD PO) Take 1 tablet by mouth daily as needed (congestion).    Yes [provider]  Cholecalciferol (VITAMIN D3) 2000 units TABS Take 2,000 Units by mouth at bedtime.   Yes [provider]  fluticasone (FLONASE) 50 MCG/ACT nasal spray Place 2 sprays into both nostrils 2 (two) times daily.  Yes [provider]  glipiZIDE (GLUCOTROL XL) 10 MG 24 hr tablet Take 10 mg by mouth daily.    Yes [provider]  insulin detemir (LEVEMIR) 100 UNIT/ML injection Inject 0.8 mLs (80 Units total) into the skin daily. Patient taking differently: Inject 100 Units into the skin daily.  11/07/17  Yes Wieting, Richard, MD    ipratropium (ATROVENT) 0.02 % nebulizer solution Take 0.5 mg by nebulization every 6 (six) hours as needed for wheezing or shortness of breath.   Yes [provider]  labetalol (NORMODYNE) 100 MG tablet Take 100 mg by mouth 2 (two) times daily.   Yes [provider]  loratadine (CLARITIN) 10 MG tablet Take 10 mg by mouth daily.   Yes [provider]  losartan (COZAAR) 100 MG tablet Take 100 mg by mouth daily.   Yes [provider]  metFORMIN (GLUCOPHAGE) 1000 MG tablet Take 1 tablet (1,000 mg total) by mouth 2 (two) times daily with a meal. Patient taking differently: Take 1,000-2,000 mg by mouth 2 (two) times daily with a meal. Take 2000 mg by mouth in the morning and 1000 mg by mouth at bedtime. 07/29/16  Yes [name], MD  multivitamin (ONE-A-DAY MEN'S) TABS tablet Take 1 tablet by mouth daily.   Yes [provider]  nitroGLYCERIN (NITROSTAT) 0.4 MG SL tablet Place 1 tablet (0.4 mg total) under the tongue every 5 (five) minutes as needed for chest pain. 10/23/17  Yes Mody, Ulice Bold, MD  pantoprazole (PROTONIX) 40 MG tablet Take 1 tablet (40 mg total) by mouth daily. 11/07/17 11/07/18 Yes Wieting, Richard, MD  potassium chloride (K-DUR,KLOR-CON) 10 MEQ tablet Take 1 tablet (10 mEq total) by mouth daily. 10/23/17  Yes Mody, Ulice Bold, MD  torsemide (DEMADEX) 20 MG tablet Take 40 mg by mouth daily.   Yes [provider]  atorvastatin (LIPITOR) 40 MG tablet Take 1 tablet (40 mg total) by mouth daily at 6 PM. Patient not taking: Reported on 02/06/2018 11/07/17   Loletha Grayer, MD  carvedilol (COREG) 25 MG tablet Take 0.5 tablets (12.5 mg total) by mouth 2 (two) times daily with a meal. Patient not taking: Reported on 03/07/2018 11/07/17   Loletha Grayer, MD  insulin aspart (NOVOLOG) 100 UNIT/ML injection Inject 7 Units into the skin 3 (three) times daily with meals. Patient not taking: Reported on 02/06/2018 11/07/17   Loletha Grayer, MD  predniSONE  (STERAPRED UNI-PAK 21 TAB) 10 MG (21) TBPK tablet Take by mouth daily. Dispense steroid taper pack as directed Patient not taking: Reported on 03/07/2018 02/06/18   Earleen Newport, MD  torsemide (DEMADEX) 10 MG tablet Take 4 tablets (40 mg total) by mouth daily. Patient not taking: Reported on 03/07/2018 11/07/17   Loletha Grayer, MD    Allergies Red dye  History reviewed. No pertinent family history.  Social History Social History   Tobacco Use  . Smoking status: Never Smoker  . Smokeless tobacco: Never Used  Substance Use Topics  . Alcohol use: No  . Drug use: Not on file    Review of Systems  Constitutional: No fever/chills Eyes: No visual changes. ENT: No sore throat. Cardiovascular: Denies chest pain. Respiratory: As above Gastrointestinal: No abdominal pain.  No nausea, no vomiting.   No constipation. Genitourinary: Negative for dysuria. Musculoskeletal: Diffuse body aches as above. Skin: Negative for rash. Neurological: Negative for headaches, focal weakness or numbness.   ____________________________________________   PHYSICAL EXAM:  VITAL SIGNS: ED Triage Vitals  Enc Vitals Group  BP 03/07/18 1407 (!) 132/58     Pulse Rate 03/07/18 1407 90     Resp 03/07/18 1407 (!) 22     Temp 03/07/18 1407 98.1 F (36.7 C)     Temp Source 03/07/18 1407 Oral     SpO2 03/07/18 1407 94 %     Weight 03/07/18 1403 (!) 320 lb (145.2 kg)     Height 03/07/18 1403 5\' 9"  (1.753 m)     Head Circumference --      Peak Flow --      Pain Score --      Pain Loc --      Pain Edu? --      Excl. in [place]? --     Constitutional: Alert and oriented. Well appearing and in no acute distress. Eyes: Conjunctivae are normal.  Head: Atraumatic. Nose: No congestion/rhinnorhea. Mouth/Throat: Mucous membranes are moist.  Neck: No stridor.   Cardiovascular: Normal rate, regular rhythm. Grossly normal heart sounds.   Respiratory: Normal respiratory effort.  No retractions. Lungs  CTAB. Gastrointestinal: Soft and nontender. No distention. No CVA tenderness. Musculoskeletal: Moderate to severe bilateral lower extremity edema without tenderness, induration or erythema.  Edema is to the ankles and calves. Neurologic:  Normal speech and language. No gross focal neurologic deficits are appreciated. Skin:  Skin is warm, dry and intact. No rash noted. Psychiatric: Mood and affect are normal. Speech and behavior are normal.  ____________________________________________   LABS (all labs ordered are listed, but only abnormal results are displayed)  Labs Reviewed  BASIC METABOLIC PANEL - Abnormal; Notable for the following components:      Result Value   Chloride 96 (*)    Glucose, Bld 246 (*)    Creatinine, Ser 1.57 (*)    Calcium 8.8 (*)    GFR calc non Af Amer 45 (*)    GFR calc Af Amer 52 (*)    All other components within normal limits  CBC - Abnormal; Notable for the following components:   WBC 12.7 (*)    RBC 4.32 (*)    Hemoglobin 12.8 (*)    HCT 38.0 (*)    RDW 15.3 (*)    All other components within normal limits  TROPONIN I - Abnormal; Notable for the following components:   Troponin I 0.04 (*)    All other components within normal limits  GLUCOSE, CAPILLARY - Abnormal; Notable for the following components:   Glucose-Capillary 236 (*)    All other components within normal limits  TROPONIN I - Abnormal; Notable for the following components:   Troponin I 0.04 (*)    All other components within normal limits   ____________________________________________  EKG  ED ECG REPORT I, Doran Stabler, the attending physician, personally viewed and interpreted this ECG.   Date: 03/07/2018  EKG Time: 1405  Rate: 89  Rhythm: normal sinus rhythm  Axis: Normal  Intervals: Right axis deviation  ST&T Change: No ST segment elevation or depression.  No abnormal T wave inversion.  ____________________________________________  RADIOLOGY  No acute finding  on the chest x-ray ____________________________________________   PROCEDURES  Procedure(s) performed:   Procedures  Critical Care performed:   ____________________________________________   INITIAL IMPRESSION / ASSESSMENT AND PLAN / ED COURSE  Pertinent labs & imaging results that were available during my care of the patient were reviewed by me and considered in my medical decision making (see chart for details).  Differential includes, but is not limited to, viral syndrome, bronchitis including  COPD exacerbation, pneumonia, reactive airway disease including asthma, CHF including exacerbation with or without pulmonary/interstitial edema, pneumothorax, ACS, thoracic trauma, and pulmonary embolism. As part of my medical decision making, I reviewed the following data within the electronic MEDICAL RECORD NUMBER Notes from prior ED visits  ----------------------------------------- 6:46 PM on 03/07/2018 -----------------------------------------  Patient says that he has been feeling improved and is urinating after getting the IV Lasix dose.  We will have him take torsemide, 40 mg twice daily over the next 2 days and he will follow-up with his cardiologist, Dr. call would.  Baseline troponin.  Patient improved with diuresis.  Reassuring chest x-ray.  I believe the patient's condition may be treated as an outpatient.  He is understanding and willing to comply.  We discussed the lab results as well as diagnosis and follow-up plan. ____________________________________________   FINAL CLINICAL IMPRESSION(S) / ED DIAGNOSES  Shortness of breath.  CHF.   NEW MEDICATIONS STARTED DURING THIS VISIT:  New Prescriptions   No medications on file     Note:  This document was prepared using Dragon voice recognition software and may include unintentional dictation errors.     Orbie Pyo, MD 03/07/18 [phone]

## 2018-03-26 ENCOUNTER — Ambulatory Visit: Payer: Self-pay | Admitting: Surgery

## 2018-03-28 DIAGNOSIS — E785 Hyperlipidemia, unspecified: Secondary | ICD-10-CM

## 2018-03-28 DIAGNOSIS — Z794 Long term (current) use of insulin: Secondary | ICD-10-CM | POA: Insufficient documentation

## 2018-03-28 DIAGNOSIS — E1122 Type 2 diabetes mellitus with diabetic chronic kidney disease: Secondary | ICD-10-CM | POA: Insufficient documentation

## 2018-03-28 DIAGNOSIS — E1169 Type 2 diabetes mellitus with other specified complication: Secondary | ICD-10-CM | POA: Insufficient documentation

## 2018-03-28 DIAGNOSIS — E119 Type 2 diabetes mellitus without complications: Secondary | ICD-10-CM | POA: Insufficient documentation

## 2018-04-05 ENCOUNTER — Encounter: Payer: Self-pay | Admitting: General Surgery

## 2018-05-02 ENCOUNTER — Ambulatory Visit: Payer: Self-pay | Admitting: Surgery

## 2018-05-14 ENCOUNTER — Ambulatory Visit: Payer: Self-pay | Admitting: Surgery

## 2018-06-14 ENCOUNTER — Encounter: Payer: Self-pay | Admitting: *Deleted

## 2018-07-02 ENCOUNTER — Emergency Department: Payer: Managed Care, Other (non HMO)

## 2018-07-02 ENCOUNTER — Emergency Department
Admission: EM | Admit: 2018-07-02 | Discharge: 2018-07-02 | Disposition: A | Discharge status: Home / Self Care | Payer: Managed Care, Other (non HMO) | Attending: Emergency Medicine | Admitting: Emergency Medicine

## 2018-07-02 ENCOUNTER — Other Ambulatory Visit: Payer: Self-pay

## 2018-07-02 ENCOUNTER — Encounter: Payer: Self-pay | Admitting: Emergency Medicine

## 2018-07-02 DIAGNOSIS — R51 Headache: Secondary | ICD-10-CM | POA: Diagnosis not present

## 2018-07-02 DIAGNOSIS — G510 Bell's palsy: Secondary | ICD-10-CM

## 2018-07-02 DIAGNOSIS — I509 Heart failure, unspecified: Secondary | ICD-10-CM | POA: Diagnosis not present

## 2018-07-02 DIAGNOSIS — J449 Chronic obstructive pulmonary disease, unspecified: Secondary | ICD-10-CM | POA: Diagnosis not present

## 2018-07-02 DIAGNOSIS — R29810 Facial weakness: Secondary | ICD-10-CM | POA: Diagnosis present

## 2018-07-02 DIAGNOSIS — E119 Type 2 diabetes mellitus without complications: Secondary | ICD-10-CM | POA: Diagnosis not present

## 2018-07-02 DIAGNOSIS — I110 Hypertensive heart disease with heart failure: Secondary | ICD-10-CM | POA: Insufficient documentation

## 2018-07-02 LAB — URINE DRUG SCREEN, QUALITATIVE (ARMC ONLY)
Amphetamines, Ur Screen: NOT DETECTED
BARBITURATES, UR SCREEN: NOT DETECTED
Benzodiazepine, Ur Scrn: NOT DETECTED
CANNABINOID 50 NG, UR ~~LOC~~: NOT DETECTED
COCAINE METABOLITE, UR ~~LOC~~: NOT DETECTED
MDMA (ECSTASY) UR SCREEN: NOT DETECTED
Methadone Scn, Ur: NOT DETECTED
OPIATE, UR SCREEN: NOT DETECTED
Phencyclidine (PCP) Ur S: NOT DETECTED
Tricyclic, Ur Screen: NOT DETECTED

## 2018-07-02 LAB — URINALYSIS, ROUTINE W REFLEX MICROSCOPIC
BILIRUBIN URINE: NEGATIVE
GLUCOSE, UA: NEGATIVE mg/dL
Hgb urine dipstick: NEGATIVE
KETONES UR: NEGATIVE mg/dL
Leukocytes, UA: NEGATIVE
Nitrite: NEGATIVE
PH: 8 (ref 5.0–8.0)
Protein, ur: NEGATIVE mg/dL
SPECIFIC GRAVITY, URINE: 1.006 (ref 1.005–1.030)

## 2018-07-02 LAB — CBC
HCT: 37.9 % — ABNORMAL LOW (ref 39.0–52.0)
Hemoglobin: 12.2 g/dL — ABNORMAL LOW (ref 13.0–17.0)
MCH: 29.7 pg (ref 26.0–34.0)
MCHC: 32.2 g/dL (ref 30.0–36.0)
MCV: 92.2 fL (ref 80.0–100.0)
Platelets: 313 10*3/uL (ref 150–400)
RBC: 4.11 MIL/uL — ABNORMAL LOW (ref 4.22–5.81)
RDW: 15.2 % (ref 11.5–15.5)
WBC: 12.6 10*3/uL — ABNORMAL HIGH (ref 4.0–10.5)
nRBC: 0 % (ref 0.0–0.2)

## 2018-07-02 LAB — TROPONIN I: Troponin I: <0.03 ng/mL (ref <0.03)

## 2018-07-02 LAB — COMPREHENSIVE METABOLIC PANEL
ALBUMIN: 3.5 g/dL (ref 3.5–5.0)
ALK PHOS: 62 U/L (ref 38–126)
ALT: 37 U/L (ref 0–44)
AST: 56 U/L — ABNORMAL HIGH (ref 15–41)
Anion gap: 15 (ref 5–15)
BILIRUBIN TOTAL: 0.7 mg/dL (ref 0.3–1.2)
BUN: 24 mg/dL — ABNORMAL HIGH (ref 8–23)
CO2: 29 mmol/L (ref 22–32)
CREATININE: 1.39 mg/dL — AB (ref 0.61–1.24)
Calcium: 8.9 mg/dL (ref 8.9–10.3)
Chloride: 93 mmol/L — ABNORMAL LOW (ref 98–111)
GFR calc non Af Amer: 52 mL/min — ABNORMAL LOW (ref >60)
GFR, EST AFRICAN AMERICAN: 60 mL/min — AB (ref >60)
GLUCOSE: 175 mg/dL — AB (ref 70–99)
Potassium: 3.5 mmol/L (ref 3.5–5.1)
SODIUM: 137 mmol/L (ref 135–145)
TOTAL PROTEIN: 7.5 g/dL (ref 6.5–8.1)

## 2018-07-02 LAB — DIFFERENTIAL
Abs Immature Granulocytes: 0.13 10*3/uL — ABNORMAL HIGH (ref 0.00–0.07)
Basophils Absolute: 0.1 10*3/uL (ref 0.0–0.1)
Basophils Relative: 1 %
EOS PCT: 3 %
Eosinophils Absolute: 0.4 10*3/uL (ref 0.0–0.5)
Immature Granulocytes: 1 %
LYMPHS ABS: 2.1 10*3/uL (ref 0.7–4.0)
LYMPHS PCT: 17 %
MONO ABS: 0.8 10*3/uL (ref 0.1–1.0)
MONOS PCT: 6 %
NEUTROS ABS: 9 10*3/uL — AB (ref 1.7–7.7)
Neutrophils Relative %: 72 %

## 2018-07-02 LAB — PROTIME-INR
INR: 1.04
Prothrombin Time: 13.5 seconds (ref 11.4–15.2)

## 2018-07-02 LAB — ETHANOL: Alcohol, Ethyl (B): <10 mg/dL (ref <10)

## 2018-07-02 LAB — APTT: aPTT: 31 seconds (ref 24–36)

## 2018-07-02 MED ORDER — PREDNISONE 10 MG PO TABS: ORAL_TABLET | ORAL | 0 refills | Status: DC

## 2018-07-02 MED ORDER — VALACYCLOVIR HCL 1 G PO TABS: 1000.0000 mg | ORAL_TABLET | Freq: Three times a day (TID) | ORAL | 0 refills | Status: DC

## 2018-07-02 MED ORDER — ASPIRIN EC 81 MG PO TBEC: 81.0000 mg | DELAYED_RELEASE_TABLET | Freq: Every day | ORAL | 2 refills | Status: AC

## 2018-07-02 MED ORDER — ASPIRIN EC 81 MG PO TBEC: 81.0000 mg | DELAYED_RELEASE_TABLET | Freq: Every day | ORAL | 2 refills | Status: DC

## 2018-07-02 NOTE — ED Provider Notes (Signed)
[facility] Emergency Department Provider Note       Time seen: ----------------------------------------- 8:35 AM on 07/02/2018 -----------------------------------------   I have reviewed the triage vital signs and the nursing notes.  HISTORY   Chief Complaint Facial Droop    HPI Jason Hudson is a 67 y.o. male with a history of CHF, COPD, diabetes, hypertension, melena, pneumonia who presents to the ED for left-sided facial drooping.  Patient reports symptoms started last night around 5:30 PM.  He has recently had a viral type illness with congestion.  He has never had a history of facial droop in the past.  He did have headache yesterday   Past Medical History:  Diagnosis Date  . CHF (congestive heart failure) ([place])   . COPD (chronic obstructive pulmonary disease) ([place])   . Diabetes mellitus without complication ([place])   . Gastroesophageal reflux disease 12/15/2017  . Hypertension   . Melena 11/05/2017  . Pneumonia 07/27/2016    Patient Active Problem List   Diagnosis Date Noted  . Hypertension 01/19/2018  . Chronic obstructive lung disease ([place]) 12/15/2017  . Gastroesophageal reflux disease 12/15/2017  . Obstructive sleep apnea of adult 12/15/2017  . Melena 11/05/2017  . CHF (congestive heart failure) ([place]) 10/20/2017  . Pneumonia 07/27/2016    Past Surgical History:  Procedure Laterality Date  . COLONOSCOPY WITH PROPOFOL N/A 11/07/2017   Procedure: COLONOSCOPY WITH PROPOFOL;  Surgeon: [name], MD;  Location: [facility] ENDOSCOPY;  Service: Gastroenterology;  Laterality: N/A;  . ESOPHAGOGASTRODUODENOSCOPY (EGD) WITH PROPOFOL N/A 11/07/2017   Procedure: ESOPHAGOGASTRODUODENOSCOPY (EGD) WITH PROPOFOL;  Surgeon: [name], MD;  Location: [facility] ENDOSCOPY;  Service: Gastroenterology;  Laterality: N/A;  . LEFT HEART CATH AND CORONARY ANGIOGRAPHY N/A 10/23/2017   Procedure: LEFT HEART CATH AND CORONARY ANGIOGRAPHY;  Surgeon: Isaias Cowman,  MD;  Location: [facility] CV LAB;  Service: Cardiovascular;  Laterality: N/A;    Allergies Red dye  Social History Social History   Tobacco Use  . Smoking status: Never Smoker  . Smokeless tobacco: Never Used  Substance Use Topics  . Alcohol use: No  . Drug use: Not on file   Review of Systems Constitutional: Negative for fever. Cardiovascular: Negative for chest pain. Respiratory: Negative for shortness of breath. Gastrointestinal: Negative for abdominal pain, vomiting and diarrhea. Musculoskeletal: Negative for back pain. Skin: Negative for rash. Neurological: Positive for headache, facial drooping  All systems negative/normal/unremarkable except as stated in the HPI  ____________________________________________   PHYSICAL EXAM:  VITAL SIGNS: ED Triage Vitals  Enc Vitals Group     BP 07/02/18 0825 (!) 159/75     Pulse Rate 07/02/18 0825 80     Resp 07/02/18 0825 18     Temp 07/02/18 0825 (!) 97.4 F (36.3 C)     Temp Source 07/02/18 0825 Oral     SpO2 07/02/18 0825 94 %     Weight 07/02/18 0828 300 lb (136.1 kg)     Height 07/02/18 0828 5\' 10"  (1.778 m)     Head Circumference --      Peak Flow --      Pain Score 07/02/18 0828 3     Pain Loc --      Pain Edu? --      Excl. in [place]? --    Constitutional: Alert and oriented. Well appearing and in no distress. Eyes: Conjunctivae are normal. Normal extraocular movements. ENT   Head: Normocephalic and atraumatic.   Nose: No congestion/rhinnorhea.   Mouth/Throat: Mucous membranes  are moist.   Neck: No stridor. Cardiovascular: Normal rate, regular rhythm. No murmurs, rubs, or gallops. Respiratory: Normal respiratory effort without tachypnea nor retractions. Breath sounds are clear and equal bilaterally. No wheezes/rales/rhonchi. Gastrointestinal: Soft and nontender. Normal bowel sounds Musculoskeletal: Nontender with normal range of motion in extremities. No lower extremity tenderness nor  edema. Neurologic:  Normal speech and language.  Right-sided facial droop is noted Skin:  Skin is warm, dry and intact. No rash noted. Psychiatric: Mood and affect are normal. Speech and behavior are normal.  ____________________________________________  EKG: Interpreted by me.  Sinus rhythm the rate of 74 bpm, low voltage, possible anterior infarct age-indeterminate, normal axis.  ____________________________________________  ED COURSE:  As part of my medical decision making, I reviewed the following data within the [place] History obtained from family if available, nursing notes, old chart and ekg, as well as notes from prior ED visits. Patient presented for facial drooping that began sometime last evening, we will assess with labs and imaging as indicated at this time.   Procedures ____________________________________________   LABS (pertinent positives/negatives)  Labs Reviewed  CBC - Abnormal; Notable for the following components:      Result Value   WBC 12.6 (*)    RBC 4.11 (*)    Hemoglobin 12.2 (*)    HCT 37.9 (*)    All other components within normal limits  DIFFERENTIAL - Abnormal; Notable for the following components:   Neutro Abs 9.0 (*)    Abs Immature Granulocytes 0.13 (*)    All other components within normal limits  URINALYSIS, ROUTINE W REFLEX MICROSCOPIC - Abnormal; Notable for the following components:   Color, Urine COLORLESS (*)    APPearance CLEAR (*)    All other components within normal limits  COMPREHENSIVE METABOLIC PANEL - Abnormal; Notable for the following components:   Chloride 93 (*)    Glucose, Bld 175 (*)    BUN 24 (*)    Creatinine, Ser 1.39 (*)    AST 56 (*)    GFR calc non Af Amer 52 (*)    GFR calc Af Amer 60 (*)    All other components within normal limits  URINE DRUG SCREEN, QUALITATIVE ([facility] ONLY)  ETHANOL  PROTIME-INR  APTT  TROPONIN I    RADIOLOGY Images were viewed by me  CT head IMPRESSION: Mild  diffuse cortical atrophy. Old right frontal infarction. No acute intracranial abnormality seen. IMPRESSION: No acute brain finding. Atrophy, encephalomalacia and gliosis in the frontal lobes quite likely secondary to old closed head injury. No evidence of recent ischemic insult.  No large or medium vessel occlusion. Distal vessel atherosclerotic irregularity most pronounced within the posterior cerebral artery branches but also affecting the anterior and middle cerebral artery branches.  ____________________________________________  DIFFERENTIAL DIAGNOSIS   Bell's palsy, CVA, TIA  FINAL ASSESSMENT AND PLAN  Bell's palsy   Plan: The patient had presented for facial drooping and recent headache. Patient's labs did not reveal any acute process. Patient's imaging was negative for acute process.  He does have atherosclerotic disease.  I will place him on an enteric-coated baby aspirin.  He has a history of bleeding ulcers so we will have to be careful with this.  He also be given antiviral medicine and steroids for his treatment of Bell's palsy.   Laurence Aly, MD   Note: This note was generated in part or whole with voice recognition software. Voice recognition is usually quite accurate but there are  transcription errors that can and very often do occur. I apologize for any typographical errors that were not detected and corrected.     Earleen Newport, MD 07/02/18 [phone]

## 2018-07-02 NOTE — ED Notes (Signed)
Pt given ice chips, MD verified.

## 2018-07-02 NOTE — ED Notes (Signed)
Pt back from MRI 

## 2018-07-02 NOTE — ED Notes (Signed)
When blood was drawn, one set was drawn from left ac and then a complete other set was drawn from right fa - the lab called to state that all specimens had hemolyzed - due to the fact that the sets were drawn separately from 2 different sites at 2 different times, the lab was advised to come and obtain sample

## 2018-07-02 NOTE — ED Notes (Signed)
Patient transported to MRI 

## 2018-07-02 NOTE — ED Triage Notes (Signed)
Here for left side facial droop. Able to slightly raise eye brow but uneven features when asked to close eyes.  Difficulty moving whole left side of face. No weakness or numbness or tingling anywhere.  Started last night at 530 pm.  Recent viral type illness with congestion.  No hx bells palsy per pt.

## 2018-07-02 NOTE — ED Notes (Signed)
ED Provider at bedside. 

## 2018-07-02 NOTE — ED Notes (Signed)
Patient transported to CT 

## 2018-07-02 NOTE — ED Notes (Signed)
First Nurse Note: Patient states he had a HA yesterday and starting last evening right sided facial weakness, states he does not know time that symptoms began but it was before he went to bed last night.

## 2018-08-24 ENCOUNTER — Other Ambulatory Visit: Payer: Self-pay

## 2018-08-24 ENCOUNTER — Encounter: Payer: Self-pay | Admitting: Emergency Medicine

## 2018-08-24 ENCOUNTER — Emergency Department: Payer: Managed Care, Other (non HMO)

## 2018-08-24 ENCOUNTER — Inpatient Hospital Stay
Admission: EM | Admit: 2018-08-24 | Discharge: 2018-08-27 | DRG: 190 | Disposition: A | Discharge status: Home / Self Care | Payer: Managed Care, Other (non HMO) | Attending: Internal Medicine | Admitting: Internal Medicine

## 2018-08-24 DIAGNOSIS — N183 Chronic kidney disease, stage 3 (moderate): Secondary | ICD-10-CM | POA: Diagnosis present

## 2018-08-24 DIAGNOSIS — I2510 Atherosclerotic heart disease of native coronary artery without angina pectoris: Secondary | ICD-10-CM | POA: Diagnosis present

## 2018-08-24 DIAGNOSIS — Z7982 Long term (current) use of aspirin: Secondary | ICD-10-CM | POA: Diagnosis not present

## 2018-08-24 DIAGNOSIS — I480 Paroxysmal atrial fibrillation: Secondary | ICD-10-CM | POA: Diagnosis present

## 2018-08-24 DIAGNOSIS — K219 Gastro-esophageal reflux disease without esophagitis: Secondary | ICD-10-CM | POA: Diagnosis present

## 2018-08-24 DIAGNOSIS — Z9119 Patient's noncompliance with other medical treatment and regimen: Secondary | ICD-10-CM | POA: Diagnosis not present

## 2018-08-24 DIAGNOSIS — Z23 Encounter for immunization: Secondary | ICD-10-CM

## 2018-08-24 DIAGNOSIS — G4733 Obstructive sleep apnea (adult) (pediatric): Secondary | ICD-10-CM | POA: Diagnosis present

## 2018-08-24 DIAGNOSIS — I5021 Acute systolic (congestive) heart failure: Secondary | ICD-10-CM | POA: Diagnosis present

## 2018-08-24 DIAGNOSIS — J9601 Acute respiratory failure with hypoxia: Secondary | ICD-10-CM | POA: Diagnosis present

## 2018-08-24 DIAGNOSIS — E1122 Type 2 diabetes mellitus with diabetic chronic kidney disease: Secondary | ICD-10-CM | POA: Diagnosis present

## 2018-08-24 DIAGNOSIS — Z79899 Other long term (current) drug therapy: Secondary | ICD-10-CM

## 2018-08-24 DIAGNOSIS — I130 Hypertensive heart and chronic kidney disease with heart failure and stage 1 through stage 4 chronic kidney disease, or unspecified chronic kidney disease: Secondary | ICD-10-CM | POA: Diagnosis present

## 2018-08-24 DIAGNOSIS — J441 Chronic obstructive pulmonary disease with (acute) exacerbation: Secondary | ICD-10-CM | POA: Diagnosis not present

## 2018-08-24 DIAGNOSIS — Z794 Long term (current) use of insulin: Secondary | ICD-10-CM

## 2018-08-24 DIAGNOSIS — Z6841 Body Mass Index (BMI) 40.0 and over, adult: Secondary | ICD-10-CM | POA: Diagnosis not present

## 2018-08-24 DIAGNOSIS — Z7951 Long term (current) use of inhaled steroids: Secondary | ICD-10-CM

## 2018-08-24 DIAGNOSIS — I959 Hypotension, unspecified: Secondary | ICD-10-CM | POA: Diagnosis present

## 2018-08-24 DIAGNOSIS — Z9989 Dependence on other enabling machines and devices: Secondary | ICD-10-CM | POA: Diagnosis not present

## 2018-08-24 LAB — CBC
HCT: 35.9 % — ABNORMAL LOW (ref 39.0–52.0)
Hemoglobin: 11.3 g/dL — ABNORMAL LOW (ref 13.0–17.0)
MCH: 29.7 pg (ref 26.0–34.0)
MCHC: 31.5 g/dL (ref 30.0–36.0)
MCV: 94.2 fL (ref 80.0–100.0)
PLATELETS: 267 10*3/uL (ref 150–400)
RBC: 3.81 MIL/uL — ABNORMAL LOW (ref 4.22–5.81)
RDW: 16.9 % — ABNORMAL HIGH (ref 11.5–15.5)
WBC: 13.4 10*3/uL — ABNORMAL HIGH (ref 4.0–10.5)
nRBC: 0 % (ref 0.0–0.2)

## 2018-08-24 LAB — BASIC METABOLIC PANEL
Anion gap: 12 (ref 5–15)
BUN: 27 mg/dL — ABNORMAL HIGH (ref 8–23)
CALCIUM: 8.9 mg/dL (ref 8.9–10.3)
CO2: 27 mmol/L (ref 22–32)
Chloride: 97 mmol/L — ABNORMAL LOW (ref 98–111)
Creatinine, Ser: 1.53 mg/dL — ABNORMAL HIGH (ref 0.61–1.24)
GFR, EST AFRICAN AMERICAN: 54 mL/min — AB (ref >60)
GFR, EST NON AFRICAN AMERICAN: 47 mL/min — AB (ref >60)
GLUCOSE: 150 mg/dL — AB (ref 70–99)
Potassium: 3.9 mmol/L (ref 3.5–5.1)
SODIUM: 136 mmol/L (ref 135–145)

## 2018-08-24 LAB — GLUCOSE, CAPILLARY
GLUCOSE-CAPILLARY: 289 mg/dL — AB (ref 70–99)
Glucose-Capillary: 290 mg/dL — ABNORMAL HIGH (ref 70–99)
Glucose-Capillary: 289 mg/dL — ABNORMAL HIGH (ref 70–99)
Glucose-Capillary: 347 mg/dL — ABNORMAL HIGH (ref 70–99)

## 2018-08-24 LAB — BLOOD GAS, ARTERIAL
Acid-base deficit: 0.8 mmol/L (ref 0.0–2.0)
Bicarbonate: 21.6 mmol/L (ref 20.0–28.0)
FIO2: 0.28
O2 SAT: 97.3 %
PO2 ART: 87 mmHg (ref 83.0–108.0)
Patient temperature: 37
pCO2 arterial: 29 mmHg — ABNORMAL LOW (ref 32.0–48.0)
pH, Arterial: 7.48 — ABNORMAL HIGH (ref 7.350–7.450)

## 2018-08-24 LAB — TROPONIN I: Troponin I: <0.03 ng/mL (ref <0.03)

## 2018-08-24 LAB — INFLUENZA PANEL BY PCR (TYPE A & B)
Influenza A By PCR: NEGATIVE
Influenza B By PCR: NEGATIVE

## 2018-08-24 MED ORDER — NITROGLYCERIN 0.4 MG SL SUBL: 0.4000 mg | SUBLINGUAL_TABLET | SUBLINGUAL | Status: DC | PRN

## 2018-08-24 MED ORDER — DILTIAZEM HCL 25 MG/5ML IV SOLN
10.0000 mg | Freq: Once | INTRAVENOUS | Status: AC
  Administered 2018-08-24: 10 mg via INTRAVENOUS

## 2018-08-24 MED ORDER — INFLUENZA VAC SPLIT HIGH-DOSE 0.5 ML IM SUSY
0.5000 mL | PREFILLED_SYRINGE | INTRAMUSCULAR | Status: AC
  Administered 2018-08-25: 0.5 mL via INTRAMUSCULAR
  Filled 2018-08-24 (×2): qty 0.5

## 2018-08-24 MED ORDER — HEPARIN (PORCINE) 25000 UT/250ML-% IV SOLN: 1400.0000 [IU]/h | INTRAVENOUS | Status: DC

## 2018-08-24 MED ORDER — SODIUM CHLORIDE 0.9 % IV SOLN
1.0000 g | Freq: Once | INTRAVENOUS | Status: AC
  Administered 2018-08-24: 1 g via INTRAVENOUS
  Filled 2018-08-24: qty 10

## 2018-08-24 MED ORDER — LORATADINE 10 MG PO TABS
10.0000 mg | ORAL_TABLET | Freq: Every day | ORAL | Status: DC
  Administered 2018-08-25 – 2018-08-27 (×3): 10 mg via ORAL
  Filled 2018-08-24 (×3): qty 1

## 2018-08-24 MED ORDER — IPRATROPIUM-ALBUTEROL 0.5-2.5 (3) MG/3ML IN SOLN
3.0000 mL | Freq: Once | RESPIRATORY_TRACT | Status: AC
  Administered 2018-08-24: 3 mL via RESPIRATORY_TRACT
  Filled 2018-08-24: qty 3

## 2018-08-24 MED ORDER — INSULIN DETEMIR 100 UNIT/ML ~~LOC~~ SOLN
80.0000 [IU] | Freq: Every day | SUBCUTANEOUS | Status: DC
  Administered 2018-08-25 – 2018-08-26 (×2): 80 [IU] via SUBCUTANEOUS
  Filled 2018-08-24 (×3): qty 0.8

## 2018-08-24 MED ORDER — INSULIN DETEMIR 100 UNIT/ML ~~LOC~~ SOLN
80.0000 [IU] | Freq: Every day | SUBCUTANEOUS | Status: DC
  Filled 2018-08-24 (×2): qty 0.8

## 2018-08-24 MED ORDER — BENZONATATE 100 MG PO CAPS
100.0000 mg | ORAL_CAPSULE | Freq: Two times a day (BID) | ORAL | Status: DC
  Administered 2018-08-24 – 2018-08-27 (×6): 100 mg via ORAL
  Filled 2018-08-24 (×6): qty 1

## 2018-08-24 MED ORDER — POTASSIUM CHLORIDE CRYS ER 10 MEQ PO TBCR
10.0000 meq | EXTENDED_RELEASE_TABLET | Freq: Every day | ORAL | Status: DC
  Administered 2018-08-25 – 2018-08-27 (×3): 10 meq via ORAL
  Filled 2018-08-24 (×3): qty 1

## 2018-08-24 MED ORDER — AMIODARONE HCL IN DEXTROSE 360-4.14 MG/200ML-% IV SOLN
60.0000 mg/h | INTRAVENOUS | Status: AC
  Administered 2018-08-24 – 2018-08-25 (×2): 60 mg/h via INTRAVENOUS
  Filled 2018-08-24 (×2): qty 200

## 2018-08-24 MED ORDER — DILTIAZEM HCL 25 MG/5ML IV SOLN
INTRAVENOUS | Status: AC
  Administered 2018-08-24: 10 mg via INTRAVENOUS
  Filled 2018-08-24: qty 5

## 2018-08-24 MED ORDER — INSULIN ASPART 100 UNIT/ML ~~LOC~~ SOLN
0.0000 [IU] | Freq: Every day | SUBCUTANEOUS | Status: DC
  Administered 2018-08-24: 3 [IU] via SUBCUTANEOUS
  Filled 2018-08-24: qty 1

## 2018-08-24 MED ORDER — ACETAMINOPHEN 325 MG PO TABS
650.0000 mg | ORAL_TABLET | Freq: Four times a day (QID) | ORAL | Status: DC | PRN
  Administered 2018-08-25: 650 mg via ORAL
  Filled 2018-08-24: qty 2

## 2018-08-24 MED ORDER — ADULT MULTIVITAMIN W/MINERALS CH
1.0000 | ORAL_TABLET | Freq: Every day | ORAL | Status: DC
  Administered 2018-08-25 – 2018-08-27 (×3): 1 via ORAL
  Filled 2018-08-24 (×3): qty 1

## 2018-08-24 MED ORDER — IPRATROPIUM-ALBUTEROL 0.5-2.5 (3) MG/3ML IN SOLN
3.0000 mL | Freq: Four times a day (QID) | RESPIRATORY_TRACT | Status: DC
  Administered 2018-08-24: 3 mL via RESPIRATORY_TRACT
  Filled 2018-08-24: qty 3

## 2018-08-24 MED ORDER — CARVEDILOL 12.5 MG PO TABS
12.5000 mg | ORAL_TABLET | Freq: Two times a day (BID) | ORAL | Status: DC
  Administered 2018-08-24 – 2018-08-26 (×5): 12.5 mg via ORAL
  Filled 2018-08-24 (×5): qty 1

## 2018-08-24 MED ORDER — RISAQUAD PO CAPS
1.0000 | ORAL_CAPSULE | Freq: Every day | ORAL | Status: DC
  Administered 2018-08-25 – 2018-08-27 (×3): 1 via ORAL
  Filled 2018-08-24 (×3): qty 1

## 2018-08-24 MED ORDER — METOPROLOL TARTRATE 5 MG/5ML IV SOLN
5.0000 mg | INTRAVENOUS | Status: DC | PRN
  Administered 2018-08-24: 5 mg via INTRAVENOUS
  Filled 2018-08-24 (×2): qty 5

## 2018-08-24 MED ORDER — SODIUM CHLORIDE 0.9 % IV SOLN
500.0000 mg | Freq: Once | INTRAVENOUS | Status: AC
  Administered 2018-08-24: 500 mg via INTRAVENOUS
  Filled 2018-08-24: qty 500

## 2018-08-24 MED ORDER — ASPIRIN 81 MG PO CHEW
324.0000 mg | CHEWABLE_TABLET | Freq: Once | ORAL | Status: AC
  Administered 2018-08-24: 324 mg via ORAL
  Filled 2018-08-24: qty 4

## 2018-08-24 MED ORDER — INSULIN ASPART 100 UNIT/ML ~~LOC~~ SOLN
0.0000 [IU] | Freq: Three times a day (TID) | SUBCUTANEOUS | Status: DC
  Administered 2018-08-24: 20 [IU] via SUBCUTANEOUS
  Administered 2018-08-25 (×2): 15 [IU] via SUBCUTANEOUS
  Administered 2018-08-25: 7 [IU] via SUBCUTANEOUS
  Administered 2018-08-25: 11 [IU] via SUBCUTANEOUS
  Administered 2018-08-26 (×2): 7 [IU] via SUBCUTANEOUS
  Administered 2018-08-26 (×2): 11 [IU] via SUBCUTANEOUS
  Administered 2018-08-27: 4 [IU] via SUBCUTANEOUS
  Administered 2018-08-27: 7 [IU] via SUBCUTANEOUS
  Filled 2018-08-24 (×11): qty 1

## 2018-08-24 MED ORDER — METOPROLOL TARTRATE 5 MG/5ML IV SOLN: 5.0000 mg | Freq: Once | INTRAVENOUS | Status: DC

## 2018-08-24 MED ORDER — ACETAMINOPHEN 650 MG RE SUPP: 650.0000 mg | Freq: Four times a day (QID) | RECTAL | Status: DC | PRN

## 2018-08-24 MED ORDER — GLIPIZIDE ER 10 MG PO TB24
10.0000 mg | ORAL_TABLET | Freq: Every day | ORAL | Status: DC
  Administered 2018-08-25 – 2018-08-27 (×3): 10 mg via ORAL
  Filled 2018-08-24 (×4): qty 1

## 2018-08-24 MED ORDER — ONDANSETRON HCL 4 MG PO TABS: 4.0000 mg | ORAL_TABLET | Freq: Four times a day (QID) | ORAL | Status: DC | PRN

## 2018-08-24 MED ORDER — SODIUM CHLORIDE 0.9 % IV SOLN: 250.0000 mL | INTRAVENOUS | Status: DC | PRN

## 2018-08-24 MED ORDER — HEPARIN SODIUM (PORCINE) 5000 UNIT/ML IJ SOLN
5000.0000 [IU] | Freq: Three times a day (TID) | INTRAMUSCULAR | Status: DC
  Filled 2018-08-24: qty 1

## 2018-08-24 MED ORDER — DIGOXIN 0.25 MG/ML IJ SOLN
0.5000 mg | Freq: Once | INTRAMUSCULAR | Status: AC
  Administered 2018-08-24: 0.5 mg via INTRAVENOUS
  Filled 2018-08-24: qty 2

## 2018-08-24 MED ORDER — METOPROLOL TARTRATE 5 MG/5ML IV SOLN
5.0000 mg | Freq: Once | INTRAVENOUS | Status: AC
  Administered 2018-08-24: 5 mg via INTRAVENOUS
  Filled 2018-08-24: qty 5

## 2018-08-24 MED ORDER — SODIUM CHLORIDE 0.9% FLUSH
3.0000 mL | Freq: Two times a day (BID) | INTRAVENOUS | Status: DC
  Administered 2018-08-24 – 2018-08-27 (×5): 3 mL via INTRAVENOUS

## 2018-08-24 MED ORDER — ONDANSETRON HCL 4 MG/2ML IJ SOLN: 4.0000 mg | Freq: Four times a day (QID) | INTRAMUSCULAR | Status: DC | PRN

## 2018-08-24 MED ORDER — INSULIN ASPART 100 UNIT/ML ~~LOC~~ SOLN: 0.0000 [IU] | Freq: Three times a day (TID) | SUBCUTANEOUS | Status: DC

## 2018-08-24 MED ORDER — IPRATROPIUM-ALBUTEROL 0.5-2.5 (3) MG/3ML IN SOLN
3.0000 mL | Freq: Four times a day (QID) | RESPIRATORY_TRACT | Status: DC
  Administered 2018-08-25 – 2018-08-26 (×5): 3 mL via RESPIRATORY_TRACT
  Filled 2018-08-24 (×4): qty 3

## 2018-08-24 MED ORDER — METHYLPREDNISOLONE SODIUM SUCC 125 MG IJ SOLR
125.0000 mg | Freq: Once | INTRAMUSCULAR | Status: AC
  Administered 2018-08-24: 125 mg via INTRAVENOUS
  Filled 2018-08-24: qty 2

## 2018-08-24 MED ORDER — DILTIAZEM HCL 30 MG PO TABS
60.0000 mg | ORAL_TABLET | Freq: Four times a day (QID) | ORAL | Status: DC
  Administered 2018-08-24: 60 mg via ORAL
  Filled 2018-08-24: qty 2

## 2018-08-24 MED ORDER — POLYETHYLENE GLYCOL 3350 17 G PO PACK: 17.0000 g | PACK | Freq: Every day | ORAL | Status: DC | PRN

## 2018-08-24 MED ORDER — PANTOPRAZOLE SODIUM 40 MG PO TBEC
40.0000 mg | DELAYED_RELEASE_TABLET | Freq: Every day | ORAL | Status: DC
  Administered 2018-08-25 – 2018-08-27 (×3): 40 mg via ORAL
  Filled 2018-08-24 (×3): qty 1

## 2018-08-24 MED ORDER — ASPIRIN EC 81 MG PO TBEC: 81.0000 mg | DELAYED_RELEASE_TABLET | Freq: Every day | ORAL | Status: DC

## 2018-08-24 MED ORDER — PNEUMOCOCCAL VAC POLYVALENT 25 MCG/0.5ML IJ INJ
0.5000 mL | INJECTION | INTRAMUSCULAR | Status: AC
  Administered 2018-08-25: 0.5 mL via INTRAMUSCULAR
  Filled 2018-08-24: qty 0.5

## 2018-08-24 MED ORDER — LEVOFLOXACIN 500 MG PO TABS
500.0000 mg | ORAL_TABLET | Freq: Every day | ORAL | Status: DC
  Administered 2018-08-25: 500 mg via ORAL
  Filled 2018-08-24 (×2): qty 1

## 2018-08-24 MED ORDER — LOSARTAN POTASSIUM 50 MG PO TABS
100.0000 mg | ORAL_TABLET | Freq: Every day | ORAL | Status: DC
  Administered 2018-08-24 – 2018-08-25 (×2): 100 mg via ORAL
  Filled 2018-08-24: qty 2
  Filled 2018-08-24: qty 4

## 2018-08-24 MED ORDER — AMIODARONE HCL IN DEXTROSE 360-4.14 MG/200ML-% IV SOLN
30.0000 mg/h | INTRAVENOUS | Status: DC
  Administered 2018-08-25 (×2): 30 mg/h via INTRAVENOUS
  Filled 2018-08-24 (×2): qty 200

## 2018-08-24 MED ORDER — VITAMIN D 25 MCG (1000 UNIT) PO TABS
2000.0000 [IU] | ORAL_TABLET | Freq: Every day | ORAL | Status: DC
  Administered 2018-08-24 – 2018-08-26 (×2): 2000 [IU] via ORAL
  Filled 2018-08-24 (×7): qty 2

## 2018-08-24 MED ORDER — AMIODARONE LOAD VIA INFUSION
150.0000 mg | Freq: Once | INTRAVENOUS | Status: AC
  Administered 2018-08-24: 150 mg via INTRAVENOUS
  Filled 2018-08-24: qty 83.34

## 2018-08-24 MED ORDER — ASPIRIN EC 325 MG PO TBEC
325.0000 mg | DELAYED_RELEASE_TABLET | Freq: Every day | ORAL | Status: DC
  Administered 2018-08-25 – 2018-08-27 (×4): 325 mg via ORAL
  Filled 2018-08-24 (×4): qty 1

## 2018-08-24 MED ORDER — VALACYCLOVIR HCL 500 MG PO TABS
1000.0000 mg | ORAL_TABLET | Freq: Three times a day (TID) | ORAL | Status: DC
  Administered 2018-08-24 – 2018-08-27 (×8): 1000 mg via ORAL
  Filled 2018-08-24 (×11): qty 2

## 2018-08-24 MED ORDER — DILTIAZEM HCL ER COATED BEADS 240 MG PO CP24
240.0000 mg | ORAL_CAPSULE | Freq: Every day | ORAL | Status: DC
  Administered 2018-08-24 – 2018-08-27 (×4): 240 mg via ORAL
  Filled 2018-08-24: qty 1
  Filled 2018-08-24 (×3): qty 2
  Filled 2018-08-24 (×3): qty 1

## 2018-08-24 MED ORDER — HEPARIN BOLUS VIA INFUSION
5000.0000 [IU] | Freq: Once | INTRAVENOUS | Status: DC
  Filled 2018-08-24: qty 5000

## 2018-08-24 MED ORDER — METHYLPREDNISOLONE SODIUM SUCC 125 MG IJ SOLR
60.0000 mg | Freq: Four times a day (QID) | INTRAMUSCULAR | Status: DC
  Administered 2018-08-24 – 2018-08-25 (×4): 60 mg via INTRAVENOUS
  Filled 2018-08-24 (×4): qty 2

## 2018-08-24 MED ORDER — SODIUM CHLORIDE 0.9% FLUSH
3.0000 mL | INTRAVENOUS | Status: DC | PRN
  Administered 2018-08-26: 3 mL via INTRAVENOUS
  Filled 2018-08-24: qty 3

## 2018-08-24 MED ORDER — TORSEMIDE 20 MG PO TABS
80.0000 mg | ORAL_TABLET | Freq: Every day | ORAL | Status: DC
  Administered 2018-08-25: 80 mg via ORAL
  Filled 2018-08-24 (×2): qty 4

## 2018-08-24 NOTE — ED Notes (Signed)
This RN brought pt's lunch tray to him.

## 2018-08-24 NOTE — Progress Notes (Signed)
Pt HR increased to a-fib RVR 150-170 at rest. 5 mg IV metoprolol given. Pt current HR is sinus tach 126-138. MD made aware. Digoxin 0.5 IV one time dose ordered. Will continue to monitor.

## 2018-08-24 NOTE — Progress Notes (Signed)
Pt HR increased to 150's and converted to afib again. IV digoxin given. Pt stood up to urinate and upon sitting down was having noted work of breathing, diaphoresis, and HR up to 230 sustaining. MD made aware, rapid response called. PRN IV Metoprolol not given per MD, amiodarone drip started and pt transferred to the unit.

## 2018-08-24 NOTE — Progress Notes (Signed)
Requested by nurse to evaluate patient due to severe tachycardia up to 202.  Heart rate seems to be in and out of atrial fibrillation along with sinus tachycardia.  Review chart.  Patient has history of atrial fibrillation.  Is on Coreg and Cardizem at home.  Patient continues to have shortness of breath and is being admitted for COPD exacerbation. Stat dose of IV Cardizem 10 mg given initially.  This was followed by 1 more dose of 10 mg IV push.  Heart rate sustained in the 120s.  He had received oral metoprolol earlier.  Will place him on oral Cardizem 60 mg every 6 hours.  Patient does seem to have episodes of tachycardia at home up to 120s.  Here he has received multiple nebulizers also has COPD exacerbation driving the tachycardia.  Will change his admission from medical floor with telemetry to a telemetry unit on 2A.  Presently in sinus tachycardia up to 120.  Obese patient laying in bed with conversational dyspnea Irregular with tachycardia Bilateral wheezing with decreased air entry  *Atrial fibrillation with rapid ventricular rate.  On Cardizem 60 mg every 6 hours.  Will add as needed Lopressor.  Will request cardiology input from [facility] cardiology. Telemetry monitoring. Will need a drip if remains uncontrolled.  *COPD exacerbation.  Started on IV steroids and nebulizers on admission.  Critical care time spent 35 minutes

## 2018-08-24 NOTE — Progress Notes (Signed)
eLink Physician-Brief Progress Note Patient Name: Jason Hudson DOB: 08-19-1951 MRN: [id number]   Date of Service  08/24/2018  HPI/Events of Note  Accepted from ED discussed with Dr Mortimer Fries.  68 yr old male accepted from floor to ICU for a fib RVR. On amiodarone drip.  MD note, labs, meds reviewed. Camera: morbidly obese on nasal o2. Stable HR < 115.  Discussed with bed side RN.  On steroids, ssi. AC hold hx of gastric ulcer/melena. On eliquis. No heparin for now.   eICU Interventions  Ordered troponin series, BMP, Mag stat. BG goal < 180.Marland Kitchen      Intervention Category Major Interventions: Arrhythmia - evaluation and management;Respiratory failure - evaluation and management Intermediate Interventions: Best-practice therapies (e.g. DVT, beta blocker, etc.) Evaluation Type: New Patient Evaluation  Jason Hudson 08/24/2018, 11:18 PM

## 2018-08-24 NOTE — ED Notes (Addendum)
Pt able to ambulate for a minute before increased WOB noted. Pt unable to continue walking at this time. Lowest oxygen saturation on RA was 91%. Pt able to control breathing after resting but continued to destat to 89% before returning to baseline within 5 minutes.

## 2018-08-24 NOTE — ED Notes (Signed)
This RN spoke with attending about pt's heart rate. Attending to order medication. Inpatient RN made aware.

## 2018-08-24 NOTE — H&P (Signed)
[facility] [place] NAME: Jason Hudson    MR#:  [id number]  DATE OF BIRTH:  Dec 04, 1950  DATE OF ADMISSION:  08/24/2018  PRIMARY CARE PHYSICIAN: [name], MD   REQUESTING/REFERRING PHYSICIAN:   CHIEF COMPLAINT:   Chief Complaint  Patient presents with  . Shortness of Breath    HISTORY OF PRESENT ILLNESS: Jason Hudson  is a 68 y.o. male with a known history of chronic congestive heart failure, COPD, type 2 diabetes mellitus, GERD, hypertension presented to the emergency room for shortness of breath and wheezing.  This is been going on for the last couple of days.  Occasional cough.  No history of fever.  Patient was evaluated emergency room his was wheezing and and COPD exacerbation.  He was given IV Solu-Medrol and nebulization treatments.  Hospitalist service was consulted for further care.  PAST MEDICAL HISTORY:   Past Medical History:  Diagnosis Date  . CHF (congestive heart failure) ([place])   . COPD (chronic obstructive pulmonary disease) ([place])   . Diabetes mellitus without complication ([place])   . Gastroesophageal reflux disease 12/15/2017  . Hypertension   . Melena 11/05/2017  . Pneumonia 07/27/2016    PAST SURGICAL HISTORY:  Past Surgical History:  Procedure Laterality Date  . COLONOSCOPY WITH PROPOFOL N/A 11/07/2017   Procedure: COLONOSCOPY WITH PROPOFOL;  Surgeon: [name], MD;  Location: [facility] ENDOSCOPY;  Service: Gastroenterology;  Laterality: N/A;  . ESOPHAGOGASTRODUODENOSCOPY (EGD) WITH PROPOFOL N/A 11/07/2017   Procedure: ESOPHAGOGASTRODUODENOSCOPY (EGD) WITH PROPOFOL;  Surgeon: [name], MD;  Location: [facility] ENDOSCOPY;  Service: Gastroenterology;  Laterality: N/A;  . LEFT HEART CATH AND CORONARY ANGIOGRAPHY N/A 10/23/2017   Procedure: LEFT HEART CATH AND CORONARY ANGIOGRAPHY;  Surgeon: Isaias Cowman, MD;  Location: [place] [facility] CV LAB;  Service: Cardiovascular;  Laterality: N/A;    SOCIAL HISTORY:   Social History   Tobacco Use  . Smoking status: Never Smoker  . Smokeless tobacco: Never Used  Substance Use Topics  . Alcohol use: No    FAMILY HISTORY: History reviewed. No pertinent family history.  DRUG ALLERGIES:  Allergies  Allergen Reactions  . Red Dye Anaphylaxis    "not as allergic to it as I used to be. I can take a little of it"    REVIEW OF SYSTEMS:   CONSTITUTIONAL: No fever, fatigue or weakness.  EYES: No blurred or double vision.  EARS, NOSE, AND THROAT: No tinnitus or ear pain.  RESPIRATORY: Has cough, shortness of breath, wheezing  no hemoptysis.  CARDIOVASCULAR: No chest pain, orthopnea, edema.  GASTROINTESTINAL: No nausea, vomiting, diarrhea or abdominal pain.  GENITOURINARY: No dysuria, hematuria.  ENDOCRINE: No polyuria, nocturia,  HEMATOLOGY: No anemia, easy bruising or bleeding SKIN: No rash or lesion. MUSCULOSKELETAL: No joint pain or arthritis.   NEUROLOGIC: No tingling, numbness, weakness.  PSYCHIATRY: No anxiety or depression.   MEDICATIONS AT HOME:  Prior to Admission medications   Medication Sig Start Date End Date Taking? Authorizing Provider  albuterol (PROVENTIL HFA;VENTOLIN HFA) 108 (90 Base) MCG/ACT inhaler Inhale 1-2 puffs into the lungs every 6 (six) hours as needed for wheezing or shortness of breath.   Yes [provider]  bifidobacterium infantis (ALIGN) capsule Take 1 capsule by mouth daily.   Yes [provider]  budesonide-formoterol (SYMBICORT) 160-4.5 MCG/ACT inhaler Inhale 2 puffs into the lungs 2 (two) times daily.   Yes [provider]  carvedilol (COREG) 12.5 MG tablet Take 12.5 mg by mouth 2 (  two) times daily.    Yes [provider]  Chlorphen-Phenyleph-ASA (ALKA-SELTZER PLUS COLD PO) Take 1 tablet by mouth daily as needed (congestion).    Yes [provider]  Cholecalciferol (VITAMIN D3) 2000 units TABS Take 2,000 Units by mouth at bedtime.   Yes [provider]   diltiazem (TIAZAC) 240 MG 24 hr capsule Take 240 mg by mouth daily.   Yes [provider]  Dulaglutide (TRULICITY) 1.5 KN/3.9JQ SOPN Inject 1.5 mg into the skin every Wednesday. 03/28/18  Yes [provider]  fluticasone (FLONASE) 50 MCG/ACT nasal spray Place 2 sprays into both nostrils 2 (two) times daily.    Yes [provider]  glipiZIDE (GLUCOTROL XL) 10 MG 24 hr tablet Take 10 mg by mouth daily.    Yes [provider]  insulin detemir (LEVEMIR) 100 UNIT/ML injection Inject 0.8 mLs (80 Units total) into the skin daily. 11/07/17  Yes Wieting, Richard, MD  ipratropium (ATROVENT) 0.02 % nebulizer solution Take 0.5 mg by nebulization every 6 (six) hours as needed for wheezing or shortness of breath.   Yes [provider]  labetalol (NORMODYNE) 100 MG tablet Take 100 mg by mouth 2 (two) times daily.   Yes [provider]  loratadine (CLARITIN) 10 MG tablet Take 10 mg by mouth daily.   Yes [provider]  losartan (COZAAR) 100 MG tablet Take 100 mg by mouth daily.   Yes [provider]  metFORMIN (GLUCOPHAGE) 1000 MG tablet Take 1 tablet (1,000 mg total) by mouth 2 (two) times daily with a meal. 07/29/16  Yes [name], MD  multivitamin (ONE-A-DAY MEN'S) TABS tablet Take 1 tablet by mouth daily.   Yes [provider]  nitroGLYCERIN (NITROSTAT) 0.4 MG SL tablet Place 1 tablet (0.4 mg total) under the tongue every 5 (five) minutes as needed for chest pain. 10/23/17  Yes Mody, Ulice Bold, MD  potassium chloride (K-DUR,KLOR-CON) 10 MEQ tablet Take 1 tablet (10 mEq total) by mouth daily. 10/23/17  Yes Mody, Ulice Bold, MD  torsemide (DEMADEX) 20 MG tablet Take 80 mg by mouth daily.    Yes [provider]  umeclidinium-vilanterol (ANORO ELLIPTA) 62.5-25 MCG/INH AEPB Inhale 1 puff into the lungs daily.   Yes [provider]  aspirin EC 81 MG tablet Take 1 tablet (81 mg total) by mouth daily. 07/02/18 07/02/19  Earleen Newport, MD  atorvastatin (LIPITOR) 40 MG tablet Take 1 tablet (40 mg total) by mouth daily at 6 PM. Patient not taking: Reported on 02/06/2018 11/07/17   Loletha Grayer, MD  benzonatate (TESSALON) 100 MG capsule Take 100 mg by mouth 2 (two) times daily.     [provider]  carvedilol (COREG) 25 MG tablet Take 0.5 tablets (12.5 mg total) by mouth 2 (two) times daily with a meal. Patient not taking: Reported on 03/07/2018 11/07/17   Loletha Grayer, MD  pantoprazole (PROTONIX) 40 MG tablet Take 1 tablet (40 mg total) by mouth daily. 11/07/17 11/07/18  Loletha Grayer, MD  predniSONE (DELTASONE) 10 MG tablet Take 60 mg by mouth daily for 1 week, then taper by decreasing by 10 mg/day for 1 week. 07/02/18   Earleen Newport, MD  predniSONE (STERAPRED UNI-PAK 21 TAB) 10 MG (21) TBPK tablet Take by mouth daily. Dispense steroid taper pack as directed Patient not taking: Reported on 03/07/2018 02/06/18   Earleen Newport, MD  torsemide (DEMADEX) 10 MG tablet Take 4 tablets (40 mg total) by mouth daily. Patient not taking: Reported  on 03/07/2018 11/07/17   Loletha Grayer, MD  valACYclovir (VALTREX) 1000 MG tablet Take 1 tablet (1,000 mg total) by mouth 3 (three) times daily. 07/02/18   Earleen Newport, MD      PHYSICAL EXAMINATION:   VITAL SIGNS: Blood pressure 124/85, pulse (!) 124, temperature (!) 97.5 F (36.4 C), temperature source Oral, resp. rate (!) 25, height 5\' 8"  (1.727 m), weight 136.1 kg, SpO2 97 %.  GENERAL:  68 y.o.-year-old patient lying in the bed with no acute distress.  EYES: Pupils equal, round, reactive to light and accommodation. No scleral icterus. Extraocular muscles intact.  HEENT: Head atraumatic, normocephalic. Oropharynx and nasopharynx clear.  NECK:  Supple, no jugular venous distention. No thyroid enlargement, no tenderness.  LUNGS: Decreased breath sounds bilaterally, bilateral wheezing, rales. No use of accessory muscles of respiration.   CARDIOVASCULAR: S1, S2 normal. No murmurs, rubs, or gallops.  ABDOMEN: Soft, nontender, nondistended. Bowel sounds present. No organomegaly or mass.  EXTREMITIES: No pedal edema, cyanosis, or clubbing.  NEUROLOGIC: Cranial nerves II through XII are intact. Muscle strength 5/5 in all extremities. Sensation intact. Gait not checked.  PSYCHIATRIC: The patient is alert and oriented x 3.  SKIN: No obvious rash, lesion, or ulcer.   LABORATORY PANEL:   CBC Recent Labs  Lab 08/24/18 0944  WBC 13.4*  HGB 11.3*  HCT 35.9*  PLT 267  MCV 94.2  MCH 29.7  MCHC 31.5  RDW 16.9*   ------------------------------------------------------------------------------------------------------------------  Chemistries  Recent Labs  Lab 08/24/18 0944  NA 136  K 3.9  CL 97*  CO2 27  GLUCOSE 150*  BUN 27*  CREATININE 1.53*  CALCIUM 8.9   ------------------------------------------------------------------------------------------------------------------ estimated creatinine clearance is 65 mL/min (A) (by C-G formula based on SCr of 1.53 mg/dL (H)). ------------------------------------------------------------------------------------------------------------------ No results for input(s): TSH, T4TOTAL, T3FREE, THYROIDAB in the last 72 hours.  Invalid input(s): FREET3   Coagulation profile No results for input(s): INR, PROTIME in the last 168 hours. ------------------------------------------------------------------------------------------------------------------- No results for input(s): DDIMER in the last 72 hours. -------------------------------------------------------------------------------------------------------------------  Cardiac Enzymes Recent Labs  Lab 08/24/18 0944  TROPONINI <0.03   ------------------------------------------------------------------------------------------------------------------ Invalid input(s):  POCBNP  ---------------------------------------------------------------------------------------------------------------  Urinalysis    Component Value Date/Time   COLORURINE COLORLESS (A) 07/02/2018 0944   APPEARANCEUR CLEAR (A) 07/02/2018 0944   LABSPEC 1.006 07/02/2018 0944   PHURINE 8.0 07/02/2018 0944   GLUCOSEU NEGATIVE 07/02/2018 0944   HGBUR NEGATIVE 07/02/2018 0944   BILIRUBINUR NEGATIVE 07/02/2018 0944   KETONESUR NEGATIVE 07/02/2018 0944   PROTEINUR NEGATIVE 07/02/2018 0944   NITRITE NEGATIVE 07/02/2018 0944   LEUKOCYTESUR NEGATIVE 07/02/2018 0944     RADIOLOGY: Dg Chest Portable 1 View  Result Date: 08/24/2018 CLINICAL DATA:  Chest pain and shortness of breath EXAM: PORTABLE CHEST 1 VIEW COMPARISON:  03/07/2018 FINDINGS: Cardiomegaly and right hemidiaphragm elevation as before. No superimposed acute pneumonia, collapse or consolidation. Negative for edema, effusion or pneumothorax. Trachea is midline. IMPRESSION: Stable cardiomegaly. No interval change or superimposed acute process. Electronically Signed   By: Jerilynn Mages.  Shick M.D.   On: 08/24/2018 10:25    EKG: Orders placed or performed during the hospital encounter of 08/24/18  . ED EKG within 10 minutes  . ED EKG within 10 minutes  . Repeat EKG  . Repeat EKG    IMPRESSION AND PLAN:  68 year old male patient with history of chronic congestive heart failure, diabetes mellitus type 2, COPD not on home oxygen, GERD, hypertension presented to the emergency room for shortness of breath and wheezing  -  Acute COPD exacerbation Admit patient to medical floor IV Solu-Medrol 60 MDQ 6 hourly Aggressive nebulization therapy Oxygen via nasal cannula Oral Levaquin antibiotic  -Hypoxia secondary to COPD exacerbation Oxygen via nasal cannula  -Chronic congestive heart failure Continue Lasix for diuresis with electrolyte supplementation Daily body weights  -Type II diabetes mellitus Diabetic diet with sliding scale coverage  with insulin  All the records are reviewed and case discussed with ED provider. Management plans discussed with the patient, family and they are in agreement.  CODE STATUS:Full code Code Status History    Date Active Date Inactive Code Status Order ID Comments User Context   11/06/2017 0006 11/07/2017 1651 Full Code [id number]  Amelia Jo, MD Inpatient   10/20/2017 1214 10/23/2017 1747 Full Code [id number]  Gorden Harms, MD ED   07/27/2016 1515 07/29/2016 1601 Full Code [id number]  Max Sane, MD ED    Advance Directive Documentation     Most Recent Value  Type of Advance Directive  Healthcare Power of [place], Living will  Pre-existing out of facility DNR order (yellow form or pink MOST form)  -  "MOST" Form in Place?  -       TOTAL TIME TAKING CARE OF THIS PATIENT: 54 minutes.    Saundra Shelling M.D on 08/24/2018 at 12:48 PM  Between 7am to 6pm - Pager - [phone]  After 6pm go to [email or web address] - password [email or web address] [place] [email or web address]  Office  [phone]  CC: Primary care physician; [name], MD

## 2018-08-24 NOTE — ED Notes (Signed)
First Nurse Note: Patient to Rm 18 via WC, Multimedia programmer in room.

## 2018-08-24 NOTE — ED Provider Notes (Addendum)
[facility] Emergency Department Provider Note   ____________________________________________   First MD Initiated Contact with Patient 08/24/18 1005     (approximate)  I have reviewed the triage vital signs and the nursing notes.   HISTORY  Chief Complaint Shortness of Breath    HPI Jason Hudson is a 68 y.o. male who complains of shortness of breath and chest pain.  Shortness of breath is been getting worse for last few days.  Chest pain last night and this morning.  It is tightness that he has.  He is also coughing up some green phlegm.  He felt cold but did not have a fever.  All the symptoms made worse with exertion.   Past Medical History:  Diagnosis Date  . CHF (congestive heart failure) ([place])   . COPD (chronic obstructive pulmonary disease) ([place])   . Diabetes mellitus without complication ([place])   . Gastroesophageal reflux disease 12/15/2017  . Hypertension   . Melena 11/05/2017  . Pneumonia 07/27/2016    Patient Active Problem List   Diagnosis Date Noted  . Hypertension 01/19/2018  . Chronic obstructive lung disease ([place]) 12/15/2017  . Gastroesophageal reflux disease 12/15/2017  . Obstructive sleep apnea of adult 12/15/2017  . Melena 11/05/2017  . CHF (congestive heart failure) ([place]) 10/20/2017  . Pneumonia 07/27/2016    Past Surgical History:  Procedure Laterality Date  . COLONOSCOPY WITH PROPOFOL N/A 11/07/2017   Procedure: COLONOSCOPY WITH PROPOFOL;  Surgeon: [name], MD;  Location: [facility] ENDOSCOPY;  Service: Gastroenterology;  Laterality: N/A;  . ESOPHAGOGASTRODUODENOSCOPY (EGD) WITH PROPOFOL N/A 11/07/2017   Procedure: ESOPHAGOGASTRODUODENOSCOPY (EGD) WITH PROPOFOL;  Surgeon: [name], MD;  Location: [facility] ENDOSCOPY;  Service: Gastroenterology;  Laterality: N/A;  . LEFT HEART CATH AND CORONARY ANGIOGRAPHY N/A 10/23/2017   Procedure: LEFT HEART CATH AND CORONARY ANGIOGRAPHY;  Surgeon: Isaias Cowman, MD;  Location:  [place] CV LAB;  Service: Cardiovascular;  Laterality: N/A;    Prior to Admission medications   Medication Sig Start Date End Date Taking? Authorizing Provider  albuterol (PROVENTIL HFA;VENTOLIN HFA) 108 (90 Base) MCG/ACT inhaler Inhale 1-2 puffs into the lungs every 6 (six) hours as needed for wheezing or shortness of breath.    [provider]  amLODipine (NORVASC) 10 MG tablet Take 10 mg by mouth daily.    [provider]  aspirin EC 81 MG tablet Take 1 tablet (81 mg total) by mouth daily. 07/02/18 07/02/19  Earleen Newport, MD  atorvastatin (LIPITOR) 40 MG tablet Take 1 tablet (40 mg total) by mouth daily at 6 PM. Patient not taking: Reported on 02/06/2018 11/07/17   Loletha Grayer, MD  benzonatate (TESSALON) 100 MG capsule Take 100 mg by mouth 2 (two) times daily.     [provider]  bifidobacterium infantis (ALIGN) capsule Take 1 capsule by mouth daily.    [provider]  budesonide-formoterol (SYMBICORT) 160-4.5 MCG/ACT inhaler Inhale 2 puffs into the lungs 2 (two) times daily.    [provider]  carvedilol (COREG) 12.5 MG tablet Take 12.5 mg by mouth daily.    [provider]  carvedilol (COREG) 25 MG tablet Take 0.5 tablets (12.5 mg total) by mouth 2 (two) times daily with a meal. Patient not taking: Reported on 03/07/2018 11/07/17   Loletha Grayer, MD  Chlorphen-Phenyleph-ASA (ALKA-SELTZER PLUS COLD PO) Take 1 tablet by mouth daily as needed (congestion).     [provider]  Cholecalciferol (VITAMIN D3) 2000 units TABS Take 2,000 Units by  mouth at bedtime.    [provider]  fluticasone (FLONASE) 50 MCG/ACT nasal spray Place 2 sprays into both nostrils 2 (two) times daily.     [provider]  glipiZIDE (GLUCOTROL XL) 10 MG 24 hr tablet Take 10 mg by mouth daily.     [provider]  insulin aspart (NOVOLOG) 100 UNIT/ML injection Inject 7 Units into the skin 3 (three) times daily  with meals. Patient not taking: Reported on 02/06/2018 11/07/17   Loletha Grayer, MD  insulin detemir (LEVEMIR) 100 UNIT/ML injection Inject 0.8 mLs (80 Units total) into the skin daily. Patient taking differently: Inject 100 Units into the skin daily.  11/07/17   Loletha Grayer, MD  ipratropium (ATROVENT) 0.02 % nebulizer solution Take 0.5 mg by nebulization every 6 (six) hours as needed for wheezing or shortness of breath.    [provider]  labetalol (NORMODYNE) 100 MG tablet Take 100 mg by mouth 2 (two) times daily.    [provider]  loratadine (CLARITIN) 10 MG tablet Take 10 mg by mouth daily.    [provider]  losartan (COZAAR) 100 MG tablet Take 100 mg by mouth daily.    [provider]  metFORMIN (GLUCOPHAGE) 1000 MG tablet Take 1 tablet (1,000 mg total) by mouth 2 (two) times daily with a meal. Patient taking differently: Take 1,000-2,000 mg by mouth 2 (two) times daily with a meal. Take 2000 mg by mouth in the morning and 1000 mg by mouth at bedtime. 07/29/16   [name], MD  multivitamin (ONE-A-DAY MEN'S) TABS tablet Take 1 tablet by mouth daily.    [provider]  nitroGLYCERIN (NITROSTAT) 0.4 MG SL tablet Place 1 tablet (0.4 mg total) under the tongue every 5 (five) minutes as needed for chest pain. 10/23/17   Bettey Costa, MD  pantoprazole (PROTONIX) 40 MG tablet Take 1 tablet (40 mg total) by mouth daily. 11/07/17 11/07/18  Loletha Grayer, MD  potassium chloride (K-DUR,KLOR-CON) 10 MEQ tablet Take 1 tablet (10 mEq total) by mouth daily. 10/23/17   Bettey Costa, MD  predniSONE (DELTASONE) 10 MG tablet Take 60 mg by mouth daily for 1 week, then taper by decreasing by 10 mg/day for 1 week. 07/02/18   Earleen Newport, MD  predniSONE (STERAPRED UNI-PAK 21 TAB) 10 MG (21) TBPK tablet Take by mouth daily. Dispense steroid taper pack as directed Patient not taking: Reported on 03/07/2018 02/06/18   Earleen Newport, MD  torsemide  (DEMADEX) 10 MG tablet Take 4 tablets (40 mg total) by mouth daily. Patient not taking: Reported on 03/07/2018 11/07/17   Loletha Grayer, MD  torsemide (DEMADEX) 20 MG tablet Take 40 mg by mouth daily.    [provider]  valACYclovir (VALTREX) 1000 MG tablet Take 1 tablet (1,000 mg total) by mouth 3 (three) times daily. 07/02/18   Earleen Newport, MD    Allergies Red dye  History reviewed. No pertinent family history.  Social History Social History   Tobacco Use  . Smoking status: Never Smoker  . Smokeless tobacco: Never Used  Substance Use Topics  . Alcohol use: No  . Drug use: Never    Review of Systems  Constitutional: No fever/chills Eyes: No visual changes. ENT: No sore throat. Cardiovascular: See HPI Respiratory: See HPI. Gastrointestinal: No abdominal pain.  No nausea, no vomiting.  No diarrhea.  No constipation. Genitourinary: Negative for dysuria. Musculoskeletal: Negative for back pain. Skin: Negative for rash. Neurological: Negative for headaches, focal weakness  ____________________________________________   PHYSICAL EXAM:  VITAL SIGNS: ED Triage Vitals  Enc Vitals Group     BP 08/24/18 0934 (!) 144/88     Pulse Rate 08/24/18 0934 (!) 126     Resp 08/24/18 0934 16     Temp 08/24/18 0934 (!) 97.5 F (36.4 C)     Temp Source 08/24/18 0934 Oral     SpO2 08/24/18 0934 93 %     Weight 08/24/18 0935 300 lb (136.1 kg)     Height 08/24/18 0935 5\' 8"  (1.727 m)     Head Circumference --      Peak Flow --      Pain Score 08/24/18 0941 9     Pain Loc --      Pain Edu? --      Excl. in [place]? --     Constitutional: Alert and oriented.  Looks short of breath Eyes: Conjunctivae are normal. . Head: Atraumatic. Nose: No congestion/rhinnorhea. Mouth/Throat: Mucous membranes are moist.  Oropharynx non-erythematous. Neck: No stridor.  Cardiovascular: Rapid rate, regular rhythm. Grossly normal heart sounds.  Good peripheral  circulation. Respiratory: Normal respiratory effort.  No retractions. Lungs clear but tight Gastrointestinal: Soft and nontender. No distention. No abdominal bruits. No CVA tenderness. Musculoskeletal: No lower extremity tenderness marked edema. Neurologic:  Normal speech and language. No gross focal neurologic deficits are appreciated. No gait instability. Skin:  Skin is warm, dry and intact. No rash noted. Psychiatric: Mood and affect are normal. Speech and behavior are normal.  ____________________________________________   LABS (all labs ordered are listed, but only abnormal results are displayed)  Labs Reviewed  BASIC METABOLIC PANEL - Abnormal; Notable for the following components:      Result Value   Chloride 97 (*)    Glucose, Bld 150 (*)    BUN 27 (*)    Creatinine, Ser 1.53 (*)    GFR calc non Af Amer 47 (*)    GFR calc Af Amer 54 (*)    All other components within normal limits  CBC - Abnormal; Notable for the following components:   WBC 13.4 (*)    RBC 3.81 (*)    Hemoglobin 11.3 (*)    HCT 35.9 (*)    RDW 16.9 (*)    All other components within normal limits  TROPONIN I  INFLUENZA PANEL BY PCR (TYPE A & B)   ____________________________________________  EKG  Initial EKG in triage looks like ST elevation inferiorly but the leads are not able to be read well due to irregular baseline EKG repeated in the room EKG read and interpreted by me shows sinus tachycardia rate of 126 normal axis there is PR segment depression in lead to a hint of ST elevation in 3 and F but not enough to meet criteria for STEMI ___EKG repeated at 1118.  This shows sinus tachycardia at 125 normal axis there is PR segment depression in several leads most notably the inferior leads there does not appear to be ST segment elevation.  I had looked at the prior EKGs with Dr. Rockey Situ he was concerned about the possibility of a flutter.  Gave the patient 5 metoprolol IV baseline got more clear rate did  not change much gone down by 2 points but it does not really appear that there is a flutter going on.  _________________________________________  RADIOLOGY  ED MD interpretation: Chest x-ray read by radiology reviewed by me shows cardiomegaly but no acute disease  Official radiology report(s): Dg Chest  Portable 1 View  Result Date: 08/24/2018 CLINICAL DATA:  Chest pain and shortness of breath EXAM: PORTABLE CHEST 1 VIEW COMPARISON:  03/07/2018 FINDINGS: Cardiomegaly and right hemidiaphragm elevation as before. No superimposed acute pneumonia, collapse or consolidation. Negative for edema, effusion or pneumothorax. Trachea is midline. IMPRESSION: Stable cardiomegaly. No interval change or superimposed acute process. Electronically Signed   By: Jerilynn Mages.  Shick M.D.   On: 08/24/2018 10:25    ____________________________________________   PROCEDURES  Procedure(s) performed:   Procedures  Critical Care performed: [place] care time 40 minutes.  This includes reviewing the patient's old records discussing the patient's EKG with Dr. Rockey Situ watching him as he gets the metoprolol and discussing with the hospitalist.  ____________________________________________   INITIAL IMPRESSION / ASSESSMENT AND PLAN / ED COURSE  We will plan on admitting this patient for shortness of breath as he desats down to 88/89 after walking he is very short of breath even just sitting still.  He still been chest tightness as well although his troponin is negative.  I believe he is having COPD exacerbation.  He says his heart rates been in the 124-6 range for the last few months at home.        ____________________________________________   FINAL CLINICAL IMPRESSION(S) / ED DIAGNOSES  Final diagnoses:  COPD exacerbation [facility])     ED Discharge Orders    None       Note:  This document was prepared using Dragon voice recognition software and may include unintentional dictation errors.    Nena Polio,  MD 08/24/18 1125    Nena Polio, MD 09/01/18 [phone]

## 2018-08-24 NOTE — Progress Notes (Addendum)
Called to room for A. fib with RVR, heart rate up to 200, symptomatic with chest discomfort, diaphoretic, shortness of breath, occurred when patient stood up to urinate, patient stated that he is unable to lay flat in bed due to shortness of breath, patient sitting at the edge of the bed, hypotension noted with IV Lopressor, given dose of IV digoxin, will start amiodarone drip, transferred to stepdown unit for further evaluation, ACS protocol, aspirin now, start heparin drip, check echocardiogram, cardiology to see in the morning

## 2018-08-24 NOTE — Progress Notes (Signed)
Pt states he has 'stomach ulcers' that bled when on eliquis 10/2017.  Discussed heparin gtt w/Dr Mortimer Fries.  Order received to not give heparin gtt.

## 2018-08-24 NOTE — ED Notes (Signed)
ED Provider at bedside. 

## 2018-08-24 NOTE — ED Notes (Signed)
Spoke to Dr. Darvin Neighbours regarding new extreme tachycardia. Verbal order received for 10MG  Diltiazem IVP STAT. EKG done before and after cardizem given. HR returned to 120s after cardizem push.

## 2018-08-24 NOTE — Progress Notes (Signed)
Advanced care plan.  Purpose of the Encounter: CODE STATUS  Parties in Attendance: Patient  Patient's Decision Capacity: Good  Subjective/Patient's story: Presented to the emergency room for shortness of breath and wheezing   Objective/Medical story Has COPD exacerbation Patient needs Solu-Medrol IV and nebulization treatments   Goals of care determination:  Advance care directives goals of care and treatment plan discussed Patient wants everything done which  includes CPR, intubation and ventilator if the need arises   CODE STATUS: Full code   Time spent discussing advanced care planning: 16 minutes

## 2018-08-24 NOTE — ED Notes (Signed)
Noted pt's HR on monitor to be fluctuating between 140-230 with sustained periods over 200. Pt sitting up in wheelchair to be transported to IP floor. Pt assisted back to stretcher by nurse and EDT. C/o mild dizziness. Pt's primary nurse at bedside. EDP McShane called to room emergently while 2nd nurse to look for attending.

## 2018-08-24 NOTE — Progress Notes (Signed)
ANTICOAGULATION CONSULT NOTE - Initial Consult  Pharmacy Consult for Heparin Indication: atrial fibrillation  Allergies  Allergen Reactions  . Red Dye Anaphylaxis    "not as allergic to it as I used to be. I can take a little of it"    Patient Measurements: Height: 5\' 8"  (172.7 cm) Weight: 300 lb (136.1 kg) IBW/kg (Calculated) : 68.4 Heparin Dosing Weight: 100.7 kg  Vital Signs: Temp: 98.3 F (36.8 C) (01/03 2049) Temp Source: Oral (01/03 2049) BP: 98/74 (01/03 2223) Pulse Rate: 224 (01/03 2223)  Labs: Recent Labs    08/24/18 0944  HGB 11.3*  HCT 35.9*  PLT 267  CREATININE 1.53*  TROPONINI <0.03    Estimated Creatinine Clearance: 65 mL/min (A) (by C-G formula based on SCr of 1.53 mg/dL (H)).   Medical History: Past Medical History:  Diagnosis Date  . CHF (congestive heart failure) ([place])   . COPD (chronic obstructive pulmonary disease) ([place])   . Diabetes mellitus without complication ([place])   . Gastroesophageal reflux disease 12/15/2017  . Hypertension   . Melena 11/05/2017  . Pneumonia 07/27/2016    Assessment: Patient is a 68yo male admitted for COPD exacerbation and developed Afib with RVR. Heparin drip to be initiated. Patient did have orders for Heparin 5000 units SQ q8h but had not received any doses and order has been discontinued. No prior to admission anticoagulants noted.  Goal of Therapy:  Heparin level 0.3-0.7 units/ml Monitor platelets by anticoagulation protocol: Yes   Plan:  Give 5000 units bolus x 1 Start heparin infusion at 1400 units/hr Check anti-Xa level in 6 hours and daily while on heparin Continue to monitor H&H and platelets  Paulina Fusi, PharmD, BCPS 08/24/2018 10:48 PM

## 2018-08-24 NOTE — ED Triage Notes (Signed)
Pt to ED from home c/o SOB and chest tightness for several days, hx of CHF and COPD.  Denies worsening swelling.  Pt states worse with exertion, denies n/v/d.

## 2018-08-25 ENCOUNTER — Inpatient Hospital Stay
Admit: 2018-08-25 | Discharge: 2018-08-25 | Disposition: A | Discharge status: Home / Self Care | Payer: Managed Care, Other (non HMO) | Attending: Family Medicine | Admitting: Family Medicine

## 2018-08-25 DIAGNOSIS — I5021 Acute systolic (congestive) heart failure: Secondary | ICD-10-CM

## 2018-08-25 LAB — GLUCOSE, CAPILLARY
Glucose-Capillary: 265 mg/dL — ABNORMAL HIGH (ref 70–99)
Glucose-Capillary: 306 mg/dL — ABNORMAL HIGH (ref 70–99)
Glucose-Capillary: 317 mg/dL — ABNORMAL HIGH (ref 70–99)
Glucose-Capillary: 244 mg/dL — ABNORMAL HIGH (ref 70–99)

## 2018-08-25 LAB — HEMOGLOBIN A1C
Hgb A1c MFr Bld: 7.4 % — ABNORMAL HIGH (ref 4.8–5.6)
Mean Plasma Glucose: 165.68 mg/dL

## 2018-08-25 LAB — BASIC METABOLIC PANEL
ANION GAP: 13 (ref 5–15)
Anion gap: 10 (ref 5–15)
BUN: 32 mg/dL — ABNORMAL HIGH (ref 8–23)
BUN: 31 mg/dL — ABNORMAL HIGH (ref 8–23)
CO2: 26 mmol/L (ref 22–32)
CO2: 28 mmol/L (ref 22–32)
Calcium: 9 mg/dL (ref 8.9–10.3)
Calcium: 8.8 mg/dL — ABNORMAL LOW (ref 8.9–10.3)
Chloride: 97 mmol/L — ABNORMAL LOW (ref 98–111)
Chloride: 97 mmol/L — ABNORMAL LOW (ref 98–111)
Creatinine, Ser: 1.93 mg/dL — ABNORMAL HIGH (ref 0.61–1.24)
Creatinine, Ser: 1.67 mg/dL — ABNORMAL HIGH (ref 0.61–1.24)
GFR calc Af Amer: 41 mL/min — ABNORMAL LOW (ref >60)
GFR calc Af Amer: 49 mL/min — ABNORMAL LOW (ref >60)
GFR calc non Af Amer: 36 mL/min — ABNORMAL LOW (ref >60)
GFR calc non Af Amer: 42 mL/min — ABNORMAL LOW (ref >60)
Glucose, Bld: 338 mg/dL — ABNORMAL HIGH (ref 70–99)
Glucose, Bld: 274 mg/dL — ABNORMAL HIGH (ref 70–99)
POTASSIUM: 4.3 mmol/L (ref 3.5–5.1)
Potassium: 4.5 mmol/L (ref 3.5–5.1)
Sodium: 136 mmol/L (ref 135–145)
Sodium: 135 mmol/L (ref 135–145)

## 2018-08-25 LAB — CBC
HCT: 35.1 % — ABNORMAL LOW (ref 39.0–52.0)
HEMOGLOBIN: 11 g/dL — AB (ref 13.0–17.0)
MCH: 30 pg (ref 26.0–34.0)
MCHC: 31.3 g/dL (ref 30.0–36.0)
MCV: 95.6 fL (ref 80.0–100.0)
NRBC: 0 % (ref 0.0–0.2)
PLATELETS: 304 10*3/uL (ref 150–400)
RBC: 3.67 MIL/uL — ABNORMAL LOW (ref 4.22–5.81)
RDW: 16.8 % — ABNORMAL HIGH (ref 11.5–15.5)
WBC: 17.9 10*3/uL — ABNORMAL HIGH (ref 4.0–10.5)

## 2018-08-25 LAB — ECHOCARDIOGRAM COMPLETE
Height: 70 in
Weight: 5418.02 oz

## 2018-08-25 LAB — TROPONIN I
Troponin I: <0.03 ng/mL (ref <0.03)
Troponin I: 0.05 ng/mL (ref <0.03)
Troponin I: 0.05 ng/mL (ref <0.03)

## 2018-08-25 LAB — MRSA PCR SCREENING: MRSA by PCR: NEGATIVE

## 2018-08-25 LAB — MAGNESIUM: Magnesium: 2 mg/dL (ref 1.7–2.4)

## 2018-08-25 MED ORDER — DOXAZOSIN MESYLATE 4 MG PO TABS
4.0000 mg | ORAL_TABLET | Freq: Every day | ORAL | Status: DC
  Administered 2018-08-25 – 2018-08-26 (×2): 4 mg via ORAL
  Filled 2018-08-25 (×4): qty 1

## 2018-08-25 MED ORDER — METFORMIN HCL 500 MG PO TABS
1000.0000 mg | ORAL_TABLET | Freq: Two times a day (BID) | ORAL | Status: DC
  Administered 2018-08-25 – 2018-08-26 (×3): 1000 mg via ORAL
  Filled 2018-08-25 (×3): qty 2

## 2018-08-25 MED ORDER — METHYLPREDNISOLONE SODIUM SUCC 40 MG IJ SOLR
40.0000 mg | Freq: Every day | INTRAMUSCULAR | Status: DC
  Administered 2018-08-26 – 2018-08-27 (×2): 40 mg via INTRAVENOUS
  Filled 2018-08-25 (×2): qty 1

## 2018-08-25 MED ORDER — FUROSEMIDE 10 MG/ML IJ SOLN
60.0000 mg | Freq: Two times a day (BID) | INTRAMUSCULAR | Status: DC
  Administered 2018-08-25: 60 mg via INTRAVENOUS
  Filled 2018-08-25: qty 6

## 2018-08-25 MED ORDER — METFORMIN HCL 500 MG PO TABS: 1000.0000 mg | ORAL_TABLET | Freq: Two times a day (BID) | ORAL | Status: DC

## 2018-08-25 MED ORDER — ALPRAZOLAM 0.25 MG PO TABS
0.2500 mg | ORAL_TABLET | Freq: Once | ORAL | Status: AC
  Administered 2018-08-25: 0.25 mg via ORAL
  Filled 2018-08-25: qty 1

## 2018-08-25 NOTE — Consult Note (Signed)
Name: Jason Hudson MRN: [id number] DOB: 19-Feb-1951     CONSULTATION DATE: 08/25/18 REFERRING MD : salary  CHIEF COMPLAINT: high HR   HISTORY OF PRESENT ILLNESS: 68 yo Morbidly obese WM admitted to [place] for acute elevated HR 230's Patient transferred from floor when he developed acute afib with RVR when he stood up to pee Has h/o sCHF, EF 50% with LVH was seen in ER for increased SOB and WOB x 2 days PTA Admitted to floor with dx of COPD exacerbation  He is alert and awake Started on amiodarone infusion   He has been having elevated HR for 6 weeks did NOT see or tell his cardiologist Dr Clayborn Bigness  Has OSA but noncompliant   No resp distress at this time HR now 120's on amiodarone infusion      PAST MEDICAL HISTORY :   has a past medical history of CHF (congestive heart failure) ([place]), COPD (chronic obstructive pulmonary disease) ([place]), Diabetes mellitus without complication ([place]), Gastroesophageal reflux disease (12/15/2017), Hypertension, Melena (11/05/2017), and Pneumonia (07/27/2016).  has a past surgical history that includes LEFT HEART CATH AND CORONARY ANGIOGRAPHY (N/A, 10/23/2017); Esophagogastroduodenoscopy (egd) with propofol (N/A, 11/07/2017); and Colonoscopy with propofol (N/A, 11/07/2017). Prior to Admission medications   Medication Sig Start Date End Date Taking? Authorizing Provider  albuterol (PROVENTIL HFA;VENTOLIN HFA) 108 (90 Base) MCG/ACT inhaler Inhale 1-2 puffs into the lungs every 6 (six) hours as needed for wheezing or shortness of breath.   Yes [provider]  bifidobacterium infantis (ALIGN) capsule Take 1 capsule by mouth daily.   Yes [provider]  budesonide-formoterol (SYMBICORT) 160-4.5 MCG/ACT inhaler Inhale 2 puffs into the lungs 2 (two) times daily.   Yes [provider]  carvedilol (COREG) 12.5 MG tablet Take 12.5 mg by mouth 2 (two) times daily.    Yes [provider]  Chlorphen-Phenyleph-ASA  (ALKA-SELTZER PLUS COLD PO) Take 1 tablet by mouth daily as needed (congestion).    Yes [provider]  Cholecalciferol (VITAMIN D3) 2000 units TABS Take 2,000 Units by mouth at bedtime.   Yes [provider]  diltiazem (TIAZAC) 240 MG 24 hr capsule Take 240 mg by mouth daily.   Yes [provider]  Dulaglutide (TRULICITY) 1.5 GG/2.6RS SOPN Inject 1.5 mg into the skin every Wednesday. 03/28/18  Yes [provider]  fluticasone (FLONASE) 50 MCG/ACT nasal spray Place 2 sprays into both nostrils 2 (two) times daily.    Yes [provider]  glipiZIDE (GLUCOTROL XL) 10 MG 24 hr tablet Take 10 mg by mouth daily.    Yes [provider]  insulin detemir (LEVEMIR) 100 UNIT/ML injection Inject 0.8 mLs (80 Units total) into the skin daily. 11/07/17  Yes Wieting, Richard, MD  ipratropium (ATROVENT) 0.02 % nebulizer solution Take 0.5 mg by nebulization every 6 (six) hours as needed for wheezing or shortness of breath.   Yes [provider]  labetalol (NORMODYNE) 100 MG tablet Take 100 mg by mouth 2 (two) times daily.   Yes [provider]  loratadine (CLARITIN) 10 MG tablet Take 10 mg by mouth daily.   Yes [provider]  losartan (COZAAR) 100 MG tablet Take 100 mg by mouth daily.   Yes [provider]  metFORMIN (GLUCOPHAGE) 1000 MG tablet Take 1 tablet (1,000 mg total) by mouth 2 (two) times daily with a meal. 07/29/16  Yes [name], MD  multivitamin (ONE-A-DAY MEN'S) TABS tablet Take 1 tablet by mouth daily.   Yes  [provider]  nitroGLYCERIN (NITROSTAT) 0.4 MG SL tablet Place 1 tablet (0.4 mg total) under the tongue every 5 (five) minutes as needed for chest pain. 10/23/17  Yes Mody, Ulice Bold, MD  potassium chloride (K-DUR,KLOR-CON) 10 MEQ tablet Take 1 tablet (10 mEq total) by mouth daily. 10/23/17  Yes Mody, Ulice Bold, MD  torsemide (DEMADEX) 20 MG tablet Take 80 mg by mouth daily.    Yes [provider]    umeclidinium-vilanterol (ANORO ELLIPTA) 62.5-25 MCG/INH AEPB Inhale 1 puff into the lungs daily.   Yes [provider]  aspirin EC 81 MG tablet Take 1 tablet (81 mg total) by mouth daily. 07/02/18 07/02/19  Earleen Newport, MD  atorvastatin (LIPITOR) 40 MG tablet Take 1 tablet (40 mg total) by mouth daily at 6 PM. Patient not taking: Reported on 02/06/2018 11/07/17   Loletha Grayer, MD  benzonatate (TESSALON) 100 MG capsule Take 100 mg by mouth 2 (two) times daily.     [provider]  carvedilol (COREG) 25 MG tablet Take 0.5 tablets (12.5 mg total) by mouth 2 (two) times daily with a meal. Patient not taking: Reported on 03/07/2018 11/07/17   Loletha Grayer, MD  pantoprazole (PROTONIX) 40 MG tablet Take 1 tablet (40 mg total) by mouth daily. 11/07/17 11/07/18  Loletha Grayer, MD  predniSONE (DELTASONE) 10 MG tablet Take 60 mg by mouth daily for 1 week, then taper by decreasing by 10 mg/day for 1 week. 07/02/18   Earleen Newport, MD  predniSONE (STERAPRED UNI-PAK 21 TAB) 10 MG (21) TBPK tablet Take by mouth daily. Dispense steroid taper pack as directed Patient not taking: Reported on 03/07/2018 02/06/18   Earleen Newport, MD  torsemide (DEMADEX) 10 MG tablet Take 4 tablets (40 mg total) by mouth daily. Patient not taking: Reported on 03/07/2018 11/07/17   Loletha Grayer, MD  valACYclovir (VALTREX) 1000 MG tablet Take 1 tablet (1,000 mg total) by mouth 3 (three) times daily. 07/02/18   Earleen Newport, MD   Allergies  Allergen Reactions  . Red Dye Anaphylaxis    "not as allergic to it as I used to be. I can take a little of it"    FAMILY HISTORY:  family history is not on file. SOCIAL HISTORY:  reports that he has never smoked. He has never used smokeless tobacco. He reports that he does not drink alcohol or use drugs.  REVIEW OF SYSTEMS:   Constitutional: Negative for fever, chills, weight loss, +malaise/fatigue and diaphoresis.  HENT: Negative for  hearing loss, ear pain, nosebleeds, congestion, sore throat, neck pain, tinnitus and ear discharge.   Eyes: Negative for blurred vision, double vision, photophobia, pain, discharge and redness.  Respiratory: Negative for cough, hemoptysis, sputum production, shortness of breath, wheezing and stridor.   Cardiovascular: Negative for chest pain, +palpitations, +orthopnea, claudication, +leg swelling and +PND.  Gastrointestinal: Negative for heartburn, nausea, vomiting, abdominal pain, diarrhea, constipation, blood in stool and melena.  Genitourinary: Negative for dysuria, urgency, frequency, hematuria and flank pain.  Musculoskeletal: Negative for myalgias, back pain, joint pain and falls.  Skin: Negative for itching and rash.  Neurological: Negative for dizziness, tingling, tremors, sensory change, speech change, focal weakness, seizures, loss of consciousness, weakness and headaches.  Endo/Heme/Allergies: Negative for environmental allergies and polydipsia. Does not bruise/bleed easily.  ALL OTHER ROS ARE NEGATIVE    VITAL SIGNS: Temp:  [97.5 F (36.4 C)-98.3 F (36.8 C)] 98 F (36.7 C) (01/03 2223) Pulse Rate:  [33-224] 123 (01/04 0000) Resp:  [  1-30] 19 (01/04 0000) BP: (98-146)/(66-127) 133/86 (01/04 0000) SpO2:  [91 %-99 %] 97 % (01/04 0000) Weight:  [136.1 kg] 136.1 kg (01/03 0935)  Physical Examination:   GENERAL:NAD, no fevers, chills, no weakness no fatigue HEAD: Normocephalic, atraumatic.  EYES: Pupils equal, round, reactive to light. Extraocular muscles intact. No scleral icterus.  MOUTH: Moist mucosal membrane.   EAR, NOSE, THROAT: Clear without exudates. No external lesions.  NECK: Supple. No thyromegaly. No nodules. No JVD.  PULMONARY:CTA B/L no wheezes, no crackles, +rhonchi CARDIOVASCULAR: S1 and S2. Regular rate and rhythm. No murmurs, rubs, or gallops. No edema.  GASTROINTESTINAL: Soft, nontender, nondistended. No masses. Positive bowel sounds.  MUSCULOSKELETAL:  +edema. Range of motion full in all extremities.  NEUROLOGIC: Cranial nerves II through XII are intact. No gross focal neurological deficits.  SKIN: No ulceration, lesions, rashes, or cyanosis. Skin warm and dry. Turgor intact.  PSYCHIATRIC: Mood, affect within normal limits. The patient is awake, alert and oriented x 3. Insight, judgment intact.      ASSESSMENT / PLAN: 68 yo morbidly obese white male with acute afib with RVR causing acute pulmonary edema and cardiac wheezing due to noncompliance of CPAP for OSA with acute sCHF exacerbation  CARDIAC FAILURE-sCHF avib with RVR -oxygen as needed -Lasix as tolerated -follow up cardiac enzymes as indicated -amiodarone infusion for rate control Await cardiology consultation Recommend repeat ECHO Unable to use anticoagulation due to h/o GIB/ulcers   OSA Advised patient to get CPAP from home and use while in hospital   SD monitoring  ELECTROLYTES -follow labs as needed -replace as needed -pharmacy consultation and following   Patient satisfied with Plan of action and management. All questions answered  Corrin Parker, M.D.  [facility]  Medical Director [place] Director [facility] Cardio-Pulmonary Department

## 2018-08-25 NOTE — Progress Notes (Signed)
CRITICAL VALUE ALERT  Critical Value:  Trop=0.05  Date & Time Notied:  08/25/2018 2411  Provider Notified: 08/25/2018  0642  Orders Received/Actions taken: No new verbal orders received.

## 2018-08-25 NOTE — Consult Note (Signed)
[facility] Cardiology  CARDIOLOGY CONSULT NOTE  Patient ID: Jason Hudson MRN: [id number] DOB/AGE: 04-07-1951 68 y.o.  Admit date: 08/24/2018 Referring Physician [place] Primary Physician [place] Primary Cardiologist [facility] Reason for Consultation atrial fibrillation  HPI: 68 year old gentleman referred for evaluation of atrial fibrillation.  The patient is admitted with shortness of breath, with COPD exacerbation.  Has experienced intermittent atrial fibrillation with rapid ventricular rate at 192 bpm alternating with sinus tachycardia at a rate of 120 bpm.  The patient was started on amiodarone drip, currently in sinus tachycardia.  He reports feeling much better.  Known history of paroxysmal atrial fibrillation, with history of GI bleed 11/05/2017 on Eliquis.  He has known coronary artery disease, with occluded RCA by cardiac catheterization 10/24/2017.  Labs notable for negative troponin less than 0.03, with follow-up troponin 0 0.05.  Patient denies chest pain.  Review of systems complete and found to be negative unless listed above     Past Medical History:  Diagnosis Date  . CHF (congestive heart failure) ([place])   . COPD (chronic obstructive pulmonary disease) ([place])   . Diabetes mellitus without complication ([place])   . Gastroesophageal reflux disease 12/15/2017  . Hypertension   . Melena 11/05/2017  . Pneumonia 07/27/2016    Past Surgical History:  Procedure Laterality Date  . COLONOSCOPY WITH PROPOFOL N/A 11/07/2017   Procedure: COLONOSCOPY WITH PROPOFOL;  Surgeon: [name], MD;  Location: [facility] ENDOSCOPY;  Service: Gastroenterology;  Laterality: N/A;  . ESOPHAGOGASTRODUODENOSCOPY (EGD) WITH PROPOFOL N/A 11/07/2017   Procedure: ESOPHAGOGASTRODUODENOSCOPY (EGD) WITH PROPOFOL;  Surgeon: [name], MD;  Location: [facility] ENDOSCOPY;  Service: Gastroenterology;  Laterality: N/A;  . LEFT HEART CATH AND CORONARY ANGIOGRAPHY N/A 10/23/2017   Procedure: LEFT HEART CATH AND CORONARY ANGIOGRAPHY;   Surgeon: Isaias Cowman, MD;  Location: [place] CV LAB;  Service: Cardiovascular;  Laterality: N/A;    Medications Prior to Admission  Medication Sig Dispense Refill Last Dose  . albuterol (PROVENTIL HFA;VENTOLIN HFA) 108 (90 Base) MCG/ACT inhaler Inhale 1-2 puffs into the lungs every 6 (six) hours as needed for wheezing or shortness of breath.   Unknown at PRN  . bifidobacterium infantis (ALIGN) capsule Take 1 capsule by mouth daily.   08/24/2018 at 0800  . budesonide-formoterol (SYMBICORT) 160-4.5 MCG/ACT inhaler Inhale 2 puffs into the lungs 2 (two) times daily.   08/24/2018 at 0800  . carvedilol (COREG) 12.5 MG tablet Take 12.5 mg by mouth 2 (two) times daily.    08/24/2018 at 0800  . Chlorphen-Phenyleph-ASA (ALKA-SELTZER PLUS COLD PO) Take 1 tablet by mouth daily as needed (congestion).    Unknown at PRN  . Cholecalciferol (VITAMIN D3) 2000 units TABS Take 2,000 Units by mouth at bedtime.   08/23/2018 at 2000  . diltiazem (TIAZAC) 240 MG 24 hr capsule Take 240 mg by mouth daily.   08/24/2018 at 0800  . Dulaglutide (TRULICITY) 1.5 QQ/2.2LN SOPN Inject 1.5 mg into the skin every Wednesday.   08/22/2018 at 2000  . fluticasone (FLONASE) 50 MCG/ACT nasal spray Place 2 sprays into both nostrils 2 (two) times daily.    08/24/2018 at 0800  . glipiZIDE (GLUCOTROL XL) 10 MG 24 hr tablet Take 10 mg by mouth daily.    08/24/2018 at 0800  . insulin detemir (LEVEMIR) 100 UNIT/ML injection Inject 0.8 mLs (80 Units total) into the skin daily. 10 mL 11 08/24/2018 at 0800  . ipratropium (ATROVENT) 0.02 % nebulizer solution Take 0.5 mg by nebulization every 6 (six) hours as needed for wheezing  or shortness of breath.   Unknown at PRN  . labetalol (NORMODYNE) 100 MG tablet Take 100 mg by mouth 2 (two) times daily.   08/24/2018 at 0800  . loratadine (CLARITIN) 10 MG tablet Take 10 mg by mouth daily.   08/24/2018 at 0800  . losartan (COZAAR) 100 MG tablet Take 100 mg by mouth daily.   08/24/2018 at 0800  . metFORMIN  (GLUCOPHAGE) 1000 MG tablet Take 1 tablet (1,000 mg total) by mouth 2 (two) times daily with a meal. 30 tablet 0 08/24/2018 at 0800  . multivitamin (ONE-A-DAY MEN'S) TABS tablet Take 1 tablet by mouth daily.   08/24/2018 at 0800  . nitroGLYCERIN (NITROSTAT) 0.4 MG SL tablet Place 1 tablet (0.4 mg total) under the tongue every 5 (five) minutes as needed for chest pain. 30 tablet 0 Unknown at PRN  . potassium chloride (K-DUR,KLOR-CON) 10 MEQ tablet Take 1 tablet (10 mEq total) by mouth daily. 30 tablet 0 08/24/2018 at 0800  . torsemide (DEMADEX) 20 MG tablet Take 80 mg by mouth daily.    08/24/2018 at 0800  . umeclidinium-vilanterol (ANORO ELLIPTA) 62.5-25 MCG/INH AEPB Inhale 1 puff into the lungs daily.   08/24/2018 at 0800  . aspirin EC 81 MG tablet Take 1 tablet (81 mg total) by mouth daily. 150 tablet 2   . atorvastatin (LIPITOR) 40 MG tablet Take 1 tablet (40 mg total) by mouth daily at 6 PM. (Patient not taking: Reported on 02/06/2018) 30 tablet 0 Not Taking at Unknown time  . benzonatate (TESSALON) 100 MG capsule Take 100 mg by mouth 2 (two) times daily.    03/07/2018 at am  . carvedilol (COREG) 25 MG tablet Take 0.5 tablets (12.5 mg total) by mouth 2 (two) times daily with a meal. (Patient not taking: Reported on 03/07/2018)   Not Taking at Unknown time  . pantoprazole (PROTONIX) 40 MG tablet Take 1 tablet (40 mg total) by mouth daily. 30 tablet 0 03/07/2018 at am  . predniSONE (DELTASONE) 10 MG tablet Take 60 mg by mouth daily for 1 week, then taper by decreasing by 10 mg/day for 1 week. 57 tablet 0   . predniSONE (STERAPRED UNI-PAK 21 TAB) 10 MG (21) TBPK tablet Take by mouth daily. Dispense steroid taper pack as directed (Patient not taking: Reported on 03/07/2018) 21 tablet 0 Not Taking at Unknown time  . torsemide (DEMADEX) 10 MG tablet Take 4 tablets (40 mg total) by mouth daily. (Patient not taking: Reported on 03/07/2018)   Not Taking at Unknown time  . valACYclovir (VALTREX) 1000 MG tablet Take 1 tablet  (1,000 mg total) by mouth 3 (three) times daily. 30 tablet 0    Social History   Socioeconomic History  . Marital status: Single    Spouse name: Not on file  . Number of children: Not on file  . Years of education: Not on file  . Highest education level: Not on file  Occupational History  . Not on file  Social Needs  . Financial resource strain: Not on file  . Food insecurity:    Worry: Not on file    Inability: Not on file  . Transportation needs:    Medical: Not on file    Non-medical: Not on file  Tobacco Use  . Smoking status: Never Smoker  . Smokeless tobacco: Never Used  Substance and Sexual Activity  . Alcohol use: No  . Drug use: Never  . Sexual activity: Not on file  Lifestyle  . Physical  activity:    Days per week: Not on file    Minutes per session: Not on file  . Stress: Not on file  Relationships  . Social connections:    Talks on phone: Not on file    Gets together: Not on file    Attends religious service: Not on file    Active member of club or organization: Not on file    Attends meetings of clubs or organizations: Not on file    Relationship status: Not on file  . Intimate partner violence:    Fear of current or ex partner: Not on file    Emotionally abused: Not on file    Physically abused: Not on file    Forced sexual activity: Not on file  Other Topics Concern  . Not on file  Social History Narrative  . Not on file    History reviewed. No pertinent family history.    Review of systems complete and found to be negative unless listed above      PHYSICAL EXAM  General: Well developed, well nourished, in no acute distress HEENT:  Normocephalic and atramatic Neck:  No JVD.  Lungs: Clear bilaterally to auscultation and percussion. Heart: HRRR . Normal S1 and S2 without gallops or murmurs.  Abdomen: Bowel sounds are positive, abdomen soft and non-tender  Msk:  Back normal, normal gait. Normal strength and tone for age. Extremities: No  clubbing, cyanosis or edema.   Neuro: Alert and oriented X 3. Psych:  Good affect, responds appropriately  Labs:   Lab Results  Component Value Date   WBC 17.9 (H) 08/25/2018   HGB 11.0 (L) 08/25/2018   HCT 35.1 (L) 08/25/2018   MCV 95.6 08/25/2018   PLT 304 08/25/2018    Recent Labs  Lab 08/25/18 0459  NA 135  K 4.3  CL 97*  CO2 28  BUN 31*  CREATININE 1.67*  CALCIUM 8.8*  GLUCOSE 274*   Lab Results  Component Value Date   TROPONINI 0.05 (HH) 08/25/2018    Lab Results  Component Value Date   CHOL 170 10/21/2017   Lab Results  Component Value Date   HDL 42 10/21/2017   Lab Results  Component Value Date   LDLCALC 101 (H) 10/21/2017   Lab Results  Component Value Date   TRIG 135 10/21/2017   Lab Results  Component Value Date   CHOLHDL 4.0 10/21/2017   No results found for: LDLDIRECT    Radiology: Dg Chest Portable 1 View  Result Date: 08/24/2018 CLINICAL DATA:  Chest pain and shortness of breath EXAM: PORTABLE CHEST 1 VIEW COMPARISON:  03/07/2018 FINDINGS: Cardiomegaly and right hemidiaphragm elevation as before. No superimposed acute pneumonia, collapse or consolidation. Negative for edema, effusion or pneumothorax. Trachea is midline. IMPRESSION: Stable cardiomegaly. No interval change or superimposed acute process. Electronically Signed   By: Jerilynn Mages.  Shick M.D.   On: 08/24/2018 10:25    EKG: Sinus tachycardia with intermittent atrial fibrillation rapid ventricular rate  ASSESSMENT AND PLAN:   1.  Paroxysmal atrial fibrillation, with episodes of atrial fibrillation with rapid ventricular rate, in the setting of COPD exacerbation, appears improved on amiodarone drip.  Previously on Eliquis, which was discontinued due to GI bleed. 2.  CAD, occluded RCA by cardiac catheterization 10/24/2017, currently without chest pain 3.  Borderline elevated troponin, likely demand supply ischemia, not due to acute coronary syndrome  4.  COPD  exacerbation  Recommendations  1.  Agree with current therapy 2.  Continue  amiodarone drip, will transition to p.o. later today or in a.m. 3.  Defer chronic anticoagulation in light of recent history of GI bleed on Eliquis 4.  Consider left atrial appendage occluder device as outpatient for stroke prevention  Signed: Isaias Cowman MD,PhD, [facility] 08/25/2018, 10:14 AM

## 2018-08-25 NOTE — Progress Notes (Signed)
[place] Progress Note Patient Name: Jason Hudson DOB: 1950-12-02 MRN: [id number]   Date of Service  08/25/2018  HPI/Events of Note  Asking  For anxiety meds. OSA. Does not use CPAP at home. Wt 155 kg. On 2 lit o2. pco2 < 35.   eICU Interventions  Low dose Xanax and go on BiPAP 12/5 for now. Asp precautions     Intervention Category Minor Interventions: Agitation / anxiety - evaluation and management  Elmer Sow 08/25/2018, 3:35 AM

## 2018-08-25 NOTE — Progress Notes (Signed)
MEDICATION RELATED CONSULT NOTE - INITIAL   Pharmacy Consult for Amiodarone DDI   Allergies  Allergen Reactions  . Red Dye Anaphylaxis    "not as allergic to it as I used to be. I can take a little of it"    Patient Measurements: Height: 5\' 8"  (172.7 cm) Weight: 300 lb (136.1 kg) IBW/kg (Calculated) : 68.4 Adjusted Body Weight:    Vital Signs: Temp: 98 F (36.7 C) (01/03 2300) Temp Source: Oral (01/03 2300) BP: 115/77 (01/04 0300) Pulse Rate: 108 (01/04 0300) Intake/Output from previous day: 01/03 0701 - 01/04 0700 In: 240 [P.O.:240] Out: -  Intake/Output from this shift: Total I/O In: 240 [P.O.:240] Out: -   Labs: Recent Labs    08/24/18 0944 08/25/18 0020  WBC 13.4*  --   HGB 11.3*  --   HCT 35.9*  --   PLT 267  --   CREATININE 1.53* 1.93*  MG  --  2.0   Estimated Creatinine Clearance: 51.5 mL/min (A) (by C-G formula based on SCr of 1.93 mg/dL (H)).   Microbiology: Recent Results (from the past 720 hour(s))  MRSA PCR Screening     Status: None   Collection Time: 08/24/18 10:57 PM  Result Value Ref Range Status   MRSA by PCR NEGATIVE NEGATIVE Final    Comment:        The GeneXpert MRSA Assay (FDA approved for NASAL specimens only), is one component of a comprehensive MRSA colonization surveillance program. It is not intended to diagnose MRSA infection nor to guide or monitor treatment for MRSA infections. Performed at [facility], [address], [place], [place] [address]     Medical History: Past Medical History:  Diagnosis Date  . CHF (congestive heart failure) ([place])   . COPD (chronic obstructive pulmonary disease) ([place])   . Diabetes mellitus without complication ([place])   . Gastroesophageal reflux disease 12/15/2017  . Hypertension   . Melena 11/05/2017  . Pneumonia 07/27/2016    Assessment: Patient is a 68yo male started on amiodarone drip for afib. Pharmacy consulted to monitor for potential drug-drug interactions.  Plan:   Reviewed current medications for possible interactions. No adjustments to medications are needed at this time. Will continue to monitor.  Paulina Fusi, PharmD, BCPS 08/25/2018 3:42 AM

## 2018-08-25 NOTE — Progress Notes (Signed)
Patient ID: Jason Hudson, male   DOB: May 02, 1951, 68 y.o.   MRN: [id number]  [employer] PROGRESS NOTE  Jason Hudson [facility] SJG:[id number] DOB: 01-03-1951 DOA: 08/24/2018 PCP: [name], MD  HPI/Subjective: Patient feels that he has gained weight and swelling in his lower extremities and presented with shortness of breath.  Patient feeling better now.  Objective: Vitals:   08/25/18 1400 08/25/18 1535  BP:  119/73  Pulse: (!) 114 (!) 107  Resp: (!) 22 17  Temp:  98.1 F (36.7 C)  SpO2: 95% 95%    Intake/Output Summary (Last 24 hours) at 08/25/2018 1557 Last data filed at 08/25/2018 1541 Gross per 24 hour  Intake 758.81 ml  Output 2600 ml  Net -1841.19 ml   Filed Weights   08/24/18 0935 08/24/18 2300  Weight: 136.1 kg (!) 153.6 kg    ROS: Review of Systems  Constitutional: Negative for chills and fever.  Eyes: Negative for blurred vision.  Respiratory: Positive for cough and shortness of breath.   Cardiovascular: Negative for chest pain.  Gastrointestinal: Negative for abdominal pain, constipation, diarrhea, nausea and vomiting.  Genitourinary: Negative for dysuria.  Musculoskeletal: Negative for joint pain.  Neurological: Negative for dizziness and headaches.   Exam: Physical Exam  Constitutional: He is oriented to person, place, and time.  HENT:  Nose: No mucosal edema.  Mouth/Throat: No oropharyngeal exudate or posterior oropharyngeal edema.  Eyes: Pupils are equal, round, and reactive to light. Conjunctivae, EOM and lids are normal.  Neck: No JVD present. Carotid bruit is not present. No edema present. No thyroid mass and no thyromegaly present.  Cardiovascular: S1 normal and S2 normal. An irregularly irregular rhythm present. Exam reveals no gallop.  No murmur heard. Pulses:      Dorsalis pedis pulses are 2+ on the right side and 2+ on the left side.  Respiratory: No respiratory distress. He has decreased breath sounds in the right lower field and  the left lower field. He has no wheezes. He has no rhonchi. He has rales in the right lower field and the left lower field.  GI: Soft. Bowel sounds are normal. There is no abdominal tenderness.  Musculoskeletal:     Right ankle: He exhibits swelling.     Left ankle: He exhibits swelling.  Lymphadenopathy:    He has no cervical adenopathy.  Neurological: He is alert and oriented to person, place, and time. No cranial nerve deficit.  Skin: Skin is warm. Nails show no clubbing.  Chronic lower extremity skin discoloration  Psychiatric: He has a normal mood and affect.      Data Reviewed: Basic Metabolic Panel: Recent Labs  Lab 08/24/18 0944 08/25/18 0020 08/25/18 0459  NA 136 136 135  K 3.9 4.5 4.3  CL 97* 97* 97*  CO2 27 26 28   GLUCOSE 150* 338* 274*  BUN 27* 32* 31*  CREATININE 1.53* 1.93* 1.67*  CALCIUM 8.9 9.0 8.8*  MG  --  2.0  --    CBC: Recent Labs  Lab 08/24/18 0944 08/25/18 0459  WBC 13.4* 17.9*  HGB 11.3* 11.0*  HCT 35.9* 35.1*  MCV 94.2 95.6  PLT 267 304   Cardiac Enzymes: Recent Labs  Lab 08/24/18 0944 08/25/18 0020 08/25/18 0459 08/25/18 1147  TROPONINI <0.03 <0.03 0.05* 0.05*   BNP (last 3 results) Recent Labs    10/20/17 1236 02/06/18 1126  BNP 155.0* 65.0    CBG: Recent Labs  Lab 08/24/18 2135 08/24/18 2225 08/24/18 2248 08/25/18 0747 08/25/18  [place]* 289* 347* 265* 306*    Recent Results (from the past 240 hour(s))  MRSA PCR Screening     Status: None   Collection Time: 08/24/18 10:57 PM  Result Value Ref Range Status   MRSA by PCR NEGATIVE NEGATIVE Final    Comment:        The GeneXpert MRSA Assay (FDA approved for NASAL specimens only), is one component of a comprehensive MRSA colonization surveillance program. It is not intended to diagnose MRSA infection nor to guide or monitor treatment for MRSA infections. Performed at [facility], [address] [place], [place] [address]       Studies: Dg Chest Portable 1 View  Result Date: 08/24/2018 CLINICAL DATA:  Chest pain and shortness of breath EXAM: PORTABLE CHEST 1 VIEW COMPARISON:  03/07/2018 FINDINGS: Cardiomegaly and right hemidiaphragm elevation as before. No superimposed acute pneumonia, collapse or consolidation. Negative for edema, effusion or pneumothorax. Trachea is midline. IMPRESSION: Stable cardiomegaly. No interval change or superimposed acute process. Electronically Signed   By: Jerilynn Mages.  Shick M.D.   On: 08/24/2018 10:25    Scheduled Meds: . acidophilus  1 capsule Oral Daily  . aspirin EC  325 mg Oral Daily  . benzonatate  100 mg Oral BID  . carvedilol  12.5 mg Oral BID  . cholecalciferol  2,000 Units Oral QHS  . diltiazem  240 mg Oral Daily  . glipiZIDE  10 mg Oral Daily  . insulin aspart  0-20 Units Subcutaneous TID AC & HS  . insulin detemir  80 Units Subcutaneous Daily  . ipratropium-albuterol  3 mL Nebulization Q6H  . levofloxacin  500 mg Oral Daily  . loratadine  10 mg Oral Daily  . losartan  100 mg Oral Daily  . metFORMIN  1,000 mg Oral BID WC  . methylPREDNISolone (SOLU-MEDROL) injection  60 mg Intravenous Q6H  . metoprolol tartrate  5 mg Intravenous Once  . multivitamin with minerals  1 tablet Oral Daily  . pantoprazole  40 mg Oral Daily  . potassium chloride  10 mEq Oral Daily  . sodium chloride flush  3 mL Intravenous Q12H  . torsemide  80 mg Oral Daily  . valACYclovir  1,000 mg Oral TID   Continuous Infusions: . sodium chloride    . amiodarone 30 mg/hr (08/25/18 1500)    Assessment/Plan:  1. Acute hypoxic respiratory failure.  Needed to be transferred to the ICU for BiPAP.  Currently off BiPAP and is on nasal cannula. 2. Acute systolic congestive heart failure.  Switch torsemide back over the Lasix 60 mg IV twice daily. 3. Atrial fibrillation with rapid ventricular response.  Now on 3 medications to control heart rate.  Amiodarone drip Cardizem and Coreg.  No anticoagulation secondary  to recent GI bleed as per cardiology. 4. COPD exacerbation.  Change to daily dosing on Solu-Medrol.  On Levaquin.  Influenza negative 5. Morbid obesity with a BMI of 48.59 6. Sleep apnea wears CPAP at night.  Currently on BiPAP here. 7. Type 2 diabetes mellitus on detemir insulin and sliding scale.  Decrease steroids at this time.  Code Status:     Code Status Orders  (From admission, onward)         Start     Ordered   08/24/18 1803  Full code  Continuous     08/24/18 1803        Code Status History    Date Active Date Inactive Code Status Order ID Comments  User Context   11/06/2017 0006 11/07/2017 1651 Full Code [id number]  Amelia Jo, MD Inpatient   10/20/2017 1214 10/23/2017 1747 Full Code [id number]  Gorden Harms, MD ED   07/27/2016 1515 07/29/2016 1601 Full Code [id number]  Max Sane, MD ED    Advance Directive Documentation     Most Recent Value  Type of Advance Directive  Healthcare Power of Attorney  Pre-existing out of facility DNR order (yellow form or pink MOST form)  -  "MOST" Form in Place?  -     Family Communication: Family at the bedside Disposition Plan: To be determined  Consultants:  Cardiology  Antibiotics:  Levaquin  Time spent: 28 minutes  [place]

## 2018-08-26 LAB — GLUCOSE, CAPILLARY
GLUCOSE-CAPILLARY: 260 mg/dL — AB (ref 70–99)
Glucose-Capillary: 245 mg/dL — ABNORMAL HIGH (ref 70–99)
Glucose-Capillary: 252 mg/dL — ABNORMAL HIGH (ref 70–99)
Glucose-Capillary: 205 mg/dL — ABNORMAL HIGH (ref 70–99)
Glucose-Capillary: 264 mg/dL — ABNORMAL HIGH (ref 70–99)

## 2018-08-26 LAB — BASIC METABOLIC PANEL
ANION GAP: 13 (ref 5–15)
BUN: 47 mg/dL — ABNORMAL HIGH (ref 8–23)
CALCIUM: 8.7 mg/dL — AB (ref 8.9–10.3)
CHLORIDE: 97 mmol/L — AB (ref 98–111)
CO2: 23 mmol/L (ref 22–32)
Creatinine, Ser: 2.03 mg/dL — ABNORMAL HIGH (ref 0.61–1.24)
GFR calc Af Amer: 39 mL/min — ABNORMAL LOW (ref >60)
GFR, EST NON AFRICAN AMERICAN: 33 mL/min — AB (ref >60)
Glucose, Bld: 186 mg/dL — ABNORMAL HIGH (ref 70–99)
POTASSIUM: 4.1 mmol/L (ref 3.5–5.1)
Sodium: 133 mmol/L — ABNORMAL LOW (ref 135–145)

## 2018-08-26 MED ORDER — TORSEMIDE 20 MG PO TABS
80.0000 mg | ORAL_TABLET | Freq: Every day | ORAL | Status: DC
  Administered 2018-08-26 – 2018-08-27 (×2): 80 mg via ORAL
  Filled 2018-08-26 (×2): qty 4

## 2018-08-26 MED ORDER — AMIODARONE HCL 200 MG PO TABS
400.0000 mg | ORAL_TABLET | Freq: Two times a day (BID) | ORAL | Status: DC
  Administered 2018-08-26 – 2018-08-27 (×3): 400 mg via ORAL
  Filled 2018-08-26 (×3): qty 2

## 2018-08-26 MED ORDER — AMOXICILLIN-POT CLAVULANATE 500-125 MG PO TABS
1.0000 | ORAL_TABLET | Freq: Two times a day (BID) | ORAL | Status: DC
  Administered 2018-08-26 – 2018-08-27 (×3): 500 mg via ORAL
  Filled 2018-08-26 (×5): qty 1

## 2018-08-26 NOTE — Progress Notes (Signed)
Sat 95% on Ra and heart rate 122 at rest. During ambulation around nurses station sat 93%on RA heart stayed 120's. However patient became very short of breath after two laps around nurses station. Returned to room after a few minutes WOB improved however slightly sob at rest. Sat 97 on ra. Will continue to monitor

## 2018-08-26 NOTE — Progress Notes (Signed)
Patient ID: Jason Hudson, male   DOB: 1950-11-10, 68 y.o.   MRN: [id number]  [employer] PROGRESS NOTE  Brandy Kabat [facility] YIR:[id number] DOB: 1950/08/24 DOA: 08/24/2018 PCP: [name], MD  HPI/Subjective: Patient feeling better.  When I saw him his resting heart rate was in the 120s.  With nursing staff he ambulated his heart rate stayed in the high 120s and he was very short of breath.  Objective: Vitals:   08/26/18 1023 08/26/18 1206  BP: 116/73 117/75  Pulse: (!) 126 (!) 122  Resp:    Temp:    SpO2:      Intake/Output Summary (Last 24 hours) at 08/26/2018 1356 Last data filed at 08/26/2018 1254 Gross per 24 hour  Intake 623.21 ml  Output 2695 ml  Net -2071.79 ml   Filed Weights   08/24/18 0935 08/24/18 2300  Weight: 136.1 kg (!) 153.6 kg    ROS: Review of Systems  Constitutional: Negative for chills and fever.  Eyes: Negative for blurred vision.  Respiratory: Positive for cough and shortness of breath.   Cardiovascular: Negative for chest pain.  Gastrointestinal: Negative for abdominal pain, constipation, diarrhea, nausea and vomiting.  Genitourinary: Negative for dysuria.  Musculoskeletal: Negative for joint pain.  Neurological: Negative for dizziness and headaches.   Exam: Physical Exam  Constitutional: He is oriented to person, place, and time.  HENT:  Nose: No mucosal edema.  Mouth/Throat: No oropharyngeal exudate or posterior oropharyngeal edema.  Eyes: Pupils are equal, round, and reactive to light. Conjunctivae, EOM and lids are normal.  Neck: No JVD present. Carotid bruit is not present. No edema present. No thyroid mass and no thyromegaly present.  Cardiovascular: S1 normal and S2 normal. An irregularly irregular rhythm present. Exam reveals no gallop.  No murmur heard. Pulses:      Dorsalis pedis pulses are 2+ on the right side and 2+ on the left side.  Respiratory: No respiratory distress. He has decreased breath sounds in the right  lower field and the left lower field. He has no wheezes. He has no rhonchi. He has rales in the right lower field and the left lower field.  GI: Soft. Bowel sounds are normal. There is no abdominal tenderness.  Musculoskeletal:     Right ankle: He exhibits swelling.     Left ankle: He exhibits swelling.  Lymphadenopathy:    He has no cervical adenopathy.  Neurological: He is alert and oriented to person, place, and time. No cranial nerve deficit.  Skin: Skin is warm. Nails show no clubbing.  Chronic lower extremity skin discoloration  Psychiatric: He has a normal mood and affect.      Data Reviewed: Basic Metabolic Panel: Recent Labs  Lab 08/24/18 0944 08/25/18 0020 08/25/18 0459 08/26/18 0422  NA 136 136 135 133*  K 3.9 4.5 4.3 4.1  CL 97* 97* 97* 97*  CO2 27 26 28 23   GLUCOSE 150* 338* 274* 186*  BUN 27* 32* 31* 47*  CREATININE 1.53* 1.93* 1.67* 2.03*  CALCIUM 8.9 9.0 8.8* 8.7*  MG  --  2.0  --   --    CBC: Recent Labs  Lab 08/24/18 0944 08/25/18 0459  WBC 13.4* 17.9*  HGB 11.3* 11.0*  HCT 35.9* 35.1*  MCV 94.2 95.6  PLT 267 304   Cardiac Enzymes: Recent Labs  Lab 08/24/18 0944 08/25/18 0020 08/25/18 0459 08/25/18 1147  TROPONINI <0.03 <0.03 0.05* 0.05*   BNP (last 3 results) Recent Labs    10/20/17 1236 02/06/18  1126  BNP 155.0* 65.0    CBG: Recent Labs  Lab 08/25/18 1153 08/25/18 1732 08/25/18 2114 08/26/18 0846 08/26/18 1139  GLUCAP 306* 317* 244* 245* 252*    Recent Results (from the past 240 hour(s))  MRSA PCR Screening     Status: None   Collection Time: 08/24/18 10:57 PM  Result Value Ref Range Status   MRSA by PCR NEGATIVE NEGATIVE Final    Comment:        The GeneXpert MRSA Assay (FDA approved for NASAL specimens only), is one component of a comprehensive MRSA colonization surveillance program. It is not intended to diagnose MRSA infection nor to guide or monitor treatment for MRSA infections. Performed at [facility], [address] [place], [place] [address]      Scheduled Meds: . acidophilus  1 capsule Oral Daily  . amiodarone  400 mg Oral BID  . amoxicillin-clavulanate  1 tablet Oral BID  . aspirin EC  325 mg Oral Daily  . benzonatate  100 mg Oral BID  . carvedilol  12.5 mg Oral BID  . cholecalciferol  2,000 Units Oral QHS  . diltiazem  240 mg Oral Daily  . doxazosin  4 mg Oral QHS  . glipiZIDE  10 mg Oral Daily  . insulin aspart  0-20 Units Subcutaneous TID AC & HS  . insulin detemir  80 Units Subcutaneous Daily  . ipratropium-albuterol  3 mL Nebulization Q6H  . loratadine  10 mg Oral Daily  . methylPREDNISolone (SOLU-MEDROL) injection  40 mg Intravenous Daily  . metoprolol tartrate  5 mg Intravenous Once  . multivitamin with minerals  1 tablet Oral Daily  . pantoprazole  40 mg Oral Daily  . potassium chloride  10 mEq Oral Daily  . sodium chloride flush  3 mL Intravenous Q12H  . torsemide  80 mg Oral Daily  . valACYclovir  1,000 mg Oral TID   Continuous Infusions: . sodium chloride      Assessment/Plan:  1. Acute hypoxic respiratory failure.  Currently off oxygen and wears BiPAP at night 2. Acute systolic congestive heart failure.  Since creatinine went up this morning I switched him back to his normal torsemide 80 mg daily. 3. Atrial fibrillation with rapid ventricular response.  Now on 3 medications to control heart rate.  Amiodarone drip Cardizem and Coreg.  No anticoagulation secondary to recent GI bleed as per cardiology. 4. COPD exacerbation.  Continue daily dosing on Solu-Medrol.  Switch to Augmentin.  Influenza negative 5. Morbid obesity with a BMI of 48.59 6. Sleep apnea wears CPAP at night.  Currently on BiPAP here. 7. Type 2 diabetes mellitus on detemir insulin and sliding scale.  Sugars will be high with dosing of Solu-Medrol..  Code Status:     Code Status Orders  (From admission, onward)         Start     Ordered   08/24/18 1803  Full code   Continuous     08/24/18 1803        Code Status History    Date Active Date Inactive Code Status Order ID Comments User Context   11/06/2017 0006 11/07/2017 1651 Full Code [id number]  Amelia Jo, MD Inpatient   10/20/2017 1214 10/23/2017 1747 Full Code [id number]  Gorden Harms, MD ED   07/27/2016 1515 07/29/2016 1601 Full Code [id number]  Max Sane, MD ED    Advance Directive Documentation     Most Recent Value  Type of Advance Directive  Healthcare  Power of Attorney  Pre-existing out of facility DNR order (yellow form or pink MOST form)  -  "MOST" Form in Place?  -     Family Communication: Family yesterday Disposition Plan: To be determined  Consultants:  Cardiology  Antibiotics:  Augmentin  Time spent: 27 minutes  [place]

## 2018-08-26 NOTE — Progress Notes (Signed)
[facility]  SUBJECTIVE: Patient denies chest pain or shortness of breath   Vitals:   08/26/18 0411 08/26/18 0822 08/26/18 0845 08/26/18 1023  BP: 105/66 (!) 94/59 112/72 116/73  Pulse: (!) 116 (!) 127 (!) 125 (!) 126  Resp: 17 20    Temp: 98 F (36.7 C) 97.9 F (36.6 C) 97.7 F (36.5 C)   TempSrc: Oral Oral Oral   SpO2: 94% 93% 96%   Weight:      Height:         Intake/Output Summary (Last 24 hours) at 08/26/2018 1042 Last data filed at 08/26/2018 1010 Gross per 24 hour  Intake 508.44 ml  Output 2695 ml  Net -2186.56 ml      PHYSICAL EXAM  General: Well developed, well nourished, in no acute distress HEENT:  Normocephalic and atramatic Neck:  No JVD.  Lungs: Clear bilaterally to auscultation and percussion. Heart: HRRR . Normal S1 and S2 without gallops or murmurs.  Abdomen: Bowel sounds are positive, abdomen soft and non-tender  Msk:  Back normal, normal gait. Normal strength and tone for age. Extremities: No clubbing, cyanosis or edema.   Neuro: Alert and oriented X 3. Psych:  Good affect, responds appropriately   LABS: Basic Metabolic Panel: Recent Labs    08/25/18 0020 08/25/18 0459 08/26/18 0422  NA 136 135 133*  K 4.5 4.3 4.1  CL 97* 97* 97*  CO2 26 28 23   GLUCOSE 338* 274* 186*  BUN 32* 31* 47*  CREATININE 1.93* 1.67* 2.03*  CALCIUM 9.0 8.8* 8.7*  MG 2.0  --   --    Liver Function Tests: No results for input(s): AST, ALT, ALKPHOS, BILITOT, PROT, ALBUMIN in the last 72 hours. No results for input(s): LIPASE, AMYLASE in the last 72 hours. CBC: Recent Labs    08/24/18 0944 08/25/18 0459  WBC 13.4* 17.9*  HGB 11.3* 11.0*  HCT 35.9* 35.1*  MCV 94.2 95.6  PLT 267 304   Cardiac Enzymes: Recent Labs    08/25/18 0020 08/25/18 0459 08/25/18 1147  TROPONINI <0.03 0.05* 0.05*   BNP: Invalid input(s): POCBNP D-Dimer: No results for input(s): DDIMER in the last 72 hours. Hemoglobin A1C: Recent Labs    08/24/18 0944  HGBA1C 7.4*    Fasting Lipid Panel: No results for input(s): CHOL, HDL, LDLCALC, TRIG, CHOLHDL, LDLDIRECT in the last 72 hours. Thyroid Function Tests: No results for input(s): TSH, T4TOTAL, T3FREE, THYROIDAB in the last 72 hours.  Invalid input(s): FREET3 Anemia Panel: No results for input(s): VITAMINB12, FOLATE, FERRITIN, TIBC, IRON, RETICCTPCT in the last 72 hours.  No results found.   Echo LVEF 40 to 45%  TELEMETRY: Sinus rhythm at 110 bpm:  ASSESSMENT AND PLAN:  Active Problems:   COPD exacerbation (HCC)    1.  Intermittent atrial fibrillation with rapid ventricular rate, in the setting of COPD exacerbation, stabilized on amiodarone drip, previously on Eliquis, which was discontinued due to GI bleed 2.  CAD, occluded RCA by cardiac catheterization 10/24/2017, currently without chest pain 3.  Borderline elevated troponin, likely demand supply ischemia, not due to acute coronary syndrome 4.  COPD exacerbation  Recommendations  1.  Agree with current therapy 2.  Transition amiodarone drip, to amiodarone 400 mg twice daily 3.  Defer chronic anticoagulation, in light of recent history of GI bleed on Eliquis 4.  Consider left atrial appendage occluder device as outpatient for stroke prevention 5.  May discharge home later today  Sign off for now, please call if  any questions   Isaias Cowman, MD, PhD, [facility] 08/26/2018 10:42 AM

## 2018-08-27 LAB — BASIC METABOLIC PANEL
Anion gap: 11 (ref 5–15)
BUN: 58 mg/dL — AB (ref 8–23)
CO2: 27 mmol/L (ref 22–32)
Calcium: 8.9 mg/dL (ref 8.9–10.3)
Chloride: 98 mmol/L (ref 98–111)
Creatinine, Ser: 2.05 mg/dL — ABNORMAL HIGH (ref 0.61–1.24)
GFR calc Af Amer: 38 mL/min — ABNORMAL LOW (ref >60)
GFR calc non Af Amer: 33 mL/min — ABNORMAL LOW (ref >60)
Glucose, Bld: 264 mg/dL — ABNORMAL HIGH (ref 70–99)
Potassium: 4.1 mmol/L (ref 3.5–5.1)
Sodium: 136 mmol/L (ref 135–145)

## 2018-08-27 LAB — GLUCOSE, CAPILLARY
Glucose-Capillary: 169 mg/dL — ABNORMAL HIGH (ref 70–99)
Glucose-Capillary: 240 mg/dL — ABNORMAL HIGH (ref 70–99)

## 2018-08-27 MED ORDER — AMOXICILLIN-POT CLAVULANATE 500-125 MG PO TABS: 1.0000 | ORAL_TABLET | Freq: Two times a day (BID) | ORAL | 0 refills | Status: DC

## 2018-08-27 MED ORDER — AMIODARONE HCL 400 MG PO TABS: ORAL_TABLET | ORAL | 0 refills | Status: DC

## 2018-08-27 MED ORDER — CARVEDILOL 25 MG PO TABS: 25.0000 mg | ORAL_TABLET | Freq: Two times a day (BID) | ORAL | 0 refills | Status: DC

## 2018-08-27 MED ORDER — DOXAZOSIN MESYLATE 4 MG PO TABS: 4.0000 mg | ORAL_TABLET | Freq: Every day | ORAL | 0 refills | Status: DC

## 2018-08-27 MED ORDER — CARVEDILOL 25 MG PO TABS
25.0000 mg | ORAL_TABLET | Freq: Two times a day (BID) | ORAL | Status: DC
  Administered 2018-08-27: 25 mg via ORAL
  Filled 2018-08-27: qty 1

## 2018-08-27 NOTE — Plan of Care (Signed)
  Problem: Education: ?Goal: Knowledge of General Education information will improve ?Description: Including pain rating scale, medication(s)/side effects and non-pharmacologic comfort measures ?Outcome: Adequate for Discharge ?  ?Problem: Health Behavior/Discharge Planning: ?Goal: Ability to manage health-related needs will improve ?Outcome: Adequate for Discharge ?  ?Problem: Education: ?Goal: Ability to demonstrate management of disease process will improve ?Outcome: Adequate for Discharge ?Goal: Ability to verbalize understanding of medication therapies will improve ?Outcome: Adequate for Discharge ?Goal: Individualized Educational Video(s) ?Outcome: Adequate for Discharge ?  ?Problem: Activity: ?Goal: Capacity to carry out activities will improve ?Outcome: Adequate for Discharge ?  ?Problem: Cardiac: ?Goal: Ability to achieve and maintain adequate cardiopulmonary perfusion will improve ?Outcome: Adequate for Discharge ?  ?

## 2018-08-27 NOTE — Progress Notes (Signed)
Patient ID: Jason Hudson   Patient admitted to [facility] hospital 08/24/2018.  Patient was discharged 08/27/2018.  Patient may return to work on 08/29/2018 which is this Wednesday.  Dr. Loletha Grayer [phone]

## 2018-08-27 NOTE — Progress Notes (Signed)
Inpatient Diabetes Program Recommendations  AACE/ADA: New Consensus Statement on Inpatient Glycemic Control (2015)  Target Ranges:  Prepandial:   less than 140 mg/dL      Peak postprandial:   less than 180 mg/dL (1-2 hours)      Critically ill patients:  140 - 180 mg/dL   Lab Results  Component Value Date   GLUCAP 240 (H) 08/27/2018   HGBA1C 7.4 (H) 08/24/2018    Review of Glycemic Control Results for Jason Hudson, Jason Hudson (MRN [id number]) as of 08/27/2018 13:17  Ref. Range 08/26/2018 16:16 08/26/2018 20:41 08/26/2018 22:31 08/27/2018 07:36 08/27/2018 11:52  Glucose-Capillary Latest Ref Range: 70 - 99 mg/dL 205 (H) 260 (H) 264 (H) 169 (H) 240 (H)   Diabetes history: DM 2 Outpatient Diabetes medications:  Trulicity 1.5 mg weekly, Glipizide 10 mg daily, Metformin 1000 mg bid, Levemir 80 units daily Current orders for Inpatient glycemic control:  Levemir 80 units daily, Novolog resistant tid with meals and HS, Glucotrol XL 10 mg daily Inpatient Diabetes Program Recommendations:   Please consider adding Novolog meal coverage 5 units tid with meals while on Solumedrol/steroids .   Thanks,  Adah Perl, RN, BC-ADM Inpatient Diabetes Coordinator Pager [phone] (8a-5p)

## 2018-08-27 NOTE — Progress Notes (Signed)
Cardiovascular and Pulmonary Nurse Note:    68 year old with known hx of chonic congestive heart failure, COPD, DM type 2, GERD, HTN, sleep apnea, morbid obesity,  who presented to the ER with c/o SOB and wheezing.  Patient admitted with dx of acute COPD exacerbation and acute systolic CHF.  Patient also had atrial fibrillation with rapid ventricular response.  Patient is for discharge today.  Patient is now on his home diuretic for HF.  Patient is being discharged on  Diltiazem, Amiodarone and coreg for heart rate control.  Heart failure medications include:  Coreg and Torsemide.  (Echo performed on 08/25/2018 EF 40 - 45%). Patient's last admission for CHF was 10/20/2017.    Rounded on patient.  Patient is dress and ready for discharge.  Educational session with patient completed.  Provided patient with "Living Better with Heart Failure" packet. Briefly reviewed definition of heart failure and signs and symptoms of an exacerbation.?Explained to patient that HF is a chronic illness which requires self-assessment / self-management along with help from the cardiologist/PCP.?? ? *Reviewed importance of and reason behind checking weight daily in the AM, after using the bathroom, but before getting dressed. Patient has not been weighting daily because his scales are broken.  Patient stated he plans to purchase a new set of scales.  Patient also stated that he feels he can tell when he is getting fluid overloaded due to the swelling.    ? *Reviewed with patient the following information: *Discussed when to call the Dr= weight gain of >2-3lb overnight of 5lb in a week,  *Discussed yellow zone= call MD: weight gain of >2-3lb overnight of 5lb in a week, increased swelling, increased SOB when lying down, chest discomfort, dizziness, increased fatigue *Red Zone= call 911: struggle to breath, fainting or near fainting, significant chest pain   *Diet - Reviewed low sodium diet-provided handout of recommended and not  recommended foods.?  *Discussed fluid intake with patient as well. Patient not currently on a fluid restriction, but advised no more than 64 ounces of fluids per day.?Patient stated that he always drinks more than 64 ounces per day because he sweats so much.  Stressed the importance of not drinking fluids excessively, as if he is drinking more than he is putting out (sweating and urination), then he will become fluid overloaded.  Patient verbalized understanding.   ? *Instructed patient to take medications as prescribed for heart failure. Explained briefly why pt is on the medications (either make you feel better, live longer or keep you out of the hospital) and discussed monitoring and side effects.  ? *Discussed exercise. Patient informed this RN that he stays active by working 5 days a week.  Encouraged patient to be as active as possible.  ? *Smoking Cessation- Patient is a NEVER smoker.? ? *[facility] - Explained the purpose of the HF Clinic. ?Explained to patient the HF Clinic does not replace PCP nor Cardiologist, but is an additional resource to helping patient manage heart failure at home. Patient has not been admitted for HF since last year.  Patient was unable to keep the HF Clinic appointment last year, as he could not get off from work.  Patient stated he would go to this appointment if he could get off from the work.  Patient informed this RN he works Mon - Fri., 8 a.m. to 5 p.m.   Appt scheduled for  09/05/2018 at 12 noon.    Other follow-up appointments: Dr. Derenda Mis, PCP,  in 6 days. Dr. Clayborn Bigness on 08/30/2018 at 11:30 a.m.   ? Again, the 5 Steps to Living Better with Heart Failure were reviewed with patient.   Patient thanked me for providing the above information. ? ? Roanna Epley, RN, BSN, [facility]? [facility]  Cardiovascular &?Pulmonary Nurse Navigator  Direct Line: [phone]  Department Phone #: [phone] Fax:  [phone]? Email Address: [email or web address]

## 2018-08-27 NOTE — Progress Notes (Addendum)
Wrong patient

## 2018-08-27 NOTE — Care Management Important Message (Signed)
Copy of signed Medicare IM left with patient in room. 

## 2018-08-27 NOTE — Discharge Summary (Signed)
[place] [facility] [place] NAME: Jason Hudson    MR#:  [id number]  DATE OF BIRTH:  [birth year]  DATE OF ADMISSION:  08/24/2018 ADMITTING PHYSICIAN: Saundra Shelling, MD  DATE OF DISCHARGE: 08/27/2018  1:44 PM  PRIMARY CARE PHYSICIAN: [name], MD    ADMISSION DIAGNOSIS:  COPD exacerbation ([place]) [J44.1]  DISCHARGE DIAGNOSIS:  Active Problems:   COPD exacerbation ([place])   SECONDARY DIAGNOSIS:   Past Medical History:  Diagnosis Date  . CHF (congestive heart failure) ([place])   . COPD (chronic obstructive pulmonary disease) ([place])   . Diabetes mellitus without complication ([place])   . Gastroesophageal reflux disease 12/15/2017  . Hypertension   . Melena 11/05/2017  . Pneumonia 07/27/2016    HOSPITAL COURSE:   1.  Acute hypoxic respiratory failure.  The patient is off oxygen.  The patient states he does wear BiPAP at night. 2.  Acute systolic congestive heart failure.  The patient was diuresed and probably a little bit over diuresed.  The patient's lungs were clear.  I switched him back to his normal torsemide.  I had to increase his Coreg to 25 mg twice a day to better control heart rate.  His losartan (ARB) was held secondary to worsening kidney function and relative hypotension.  Spironolactone contraindicated currently with worsening kidney function.  Follow-up CHF clinic as outpatient. The patient's weight today was 331 pounds. 3.  Atrial fibrillation with rapid ventricular response.  Patient was transferred to the ICU for amiodarone drip.  He was converted over to amiodarone 400 mg twice daily.  He is already on Cardizem CD 240 mg daily.  I titrated up his Coreg to 25 mg twice daily.  The patient's resting heart rate was around 110 to 116.  With ambulation the patient's heart rate did go up to 128.  The patient was feeling much improved and wanted to go home.  Follow-up closely with cardiology as outpatient. 4.  COPD exacerbation.  Finish up Augmentin.   Influenza negative.  The patient received Solu-Medrol during the hospital course.  I will discontinue Solu-Medrol currently since the lungs are clear. 5.  Acute kidney injury on chronic kidney disease stage III.  Likely combination from over diuresis with IV Lasix and Solu-Medrol. 6.  Morbid obesity with a BMI of 47.62 7.  Sleep apnea wears CPAP at night 8.  Type 2 diabetes mellitus on detemir insulin, glipizide and sliding scale while here in the hospital.  I told him to stop his metformin.  DISCHARGE CONDITIONS:   Satisfactory  CONSULTS OBTAINED:  Treatment Team:  Isaias Cowman, MD  DRUG ALLERGIES:   Allergies  Allergen Reactions  . Red Dye Anaphylaxis    "not as allergic to it as I used to be. I can take a little of it"    DISCHARGE MEDICATIONS:   Allergies as of 08/27/2018      Reactions   Red Dye Anaphylaxis   "not as allergic to it as I used to be. I can take a little of it"      Medication List    STOP taking these medications   atorvastatin 40 MG tablet Commonly known as:  LIPITOR   labetalol 100 MG tablet Commonly known as:  NORMODYNE   losartan 100 MG tablet Commonly known as:  COZAAR   metFORMIN 1000 MG tablet Commonly known as:  GLUCOPHAGE   predniSONE 10 MG (21) Tbpk tablet Commonly known as:  STERAPRED UNI-PAK 21 TAB  predniSONE 10 MG tablet Commonly known as:  DELTASONE   valACYclovir 1000 MG tablet Commonly known as:  VALTREX     TAKE these medications   albuterol 108 (90 Base) MCG/ACT inhaler Commonly known as:  PROVENTIL HFA;VENTOLIN HFA Inhale 1-2 puffs into the lungs every 6 (six) hours as needed for wheezing or shortness of breath.   ALKA-SELTZER PLUS COLD PO Take 1 tablet by mouth daily as needed (congestion).   amiodarone 400 MG tablet Commonly known as:  PACERONE One tablet twice a day for one week, then one tablet daily afterwards   amoxicillin-clavulanate 500-125 MG tablet Commonly known as:  AUGMENTIN Take 1 tablet  (500 mg total) by mouth 2 (two) times daily.   ANORO ELLIPTA 62.5-25 MCG/INH Aepb Generic drug:  umeclidinium-vilanterol Inhale 1 puff into the lungs daily.   aspirin EC 81 MG tablet Take 1 tablet (81 mg total) by mouth daily.   benzonatate 100 MG capsule Commonly known as:  TESSALON Take 100 mg by mouth 2 (two) times daily.   bifidobacterium infantis capsule Take 1 capsule by mouth daily.   budesonide-formoterol 160-4.5 MCG/ACT inhaler Commonly known as:  SYMBICORT Inhale 2 puffs into the lungs 2 (two) times daily.   carvedilol 25 MG tablet Commonly known as:  COREG Take 1 tablet (25 mg total) by mouth 2 (two) times daily. What changed:    medication strength  how much to take  Another medication with the same name was removed. Continue taking this medication, and follow the directions you see here.   diltiazem 240 MG 24 hr capsule Commonly known as:  TIAZAC Take 240 mg by mouth daily.   doxazosin 4 MG tablet Commonly known as:  CARDURA Take 1 tablet (4 mg total) by mouth at bedtime.   fluticasone 50 MCG/ACT nasal spray Commonly known as:  FLONASE Place 2 sprays into both nostrils 2 (two) times daily.   glipiZIDE 10 MG 24 hr tablet Commonly known as:  GLUCOTROL XL Take 10 mg by mouth daily.   insulin detemir 100 UNIT/ML injection Commonly known as:  LEVEMIR Inject 0.8 mLs (80 Units total) into the skin daily.   ipratropium 0.02 % nebulizer solution Commonly known as:  ATROVENT Take 0.5 mg by nebulization every 6 (six) hours as needed for wheezing or shortness of breath.   loratadine 10 MG tablet Commonly known as:  CLARITIN Take 10 mg by mouth daily.   multivitamin Tabs tablet Take 1 tablet by mouth daily.   nitroGLYCERIN 0.4 MG SL tablet Commonly known as:  NITROSTAT Place 1 tablet (0.4 mg total) under the tongue every 5 (five) minutes as needed for chest pain.   pantoprazole 40 MG tablet Commonly known as:  PROTONIX Take 1 tablet (40 mg total) by  mouth daily.   potassium chloride 10 MEQ tablet Commonly known as:  K-DUR,KLOR-CON Take 1 tablet (10 mEq total) by mouth daily.   torsemide 20 MG tablet Commonly known as:  DEMADEX Take 80 mg by mouth daily. What changed:  Another medication with the same name was removed. Continue taking this medication, and follow the directions you see here.   TRULICITY 1.5 FW/2.6VZ Sopn Generic drug:  Dulaglutide Inject 1.5 mg into the skin every Wednesday.   Vitamin D3 50 MCG (2000 UT) Tabs Take 2,000 Units by mouth at bedtime.        DISCHARGE INSTRUCTIONS:   Follow-up PMD 6 days Follow-up cardiology 5 days  If you experience worsening of your admission symptoms, develop shortness  of breath, life threatening emergency, suicidal or homicidal thoughts you must seek medical attention immediately by calling 911 or calling your MD immediately  if symptoms less severe.  You Must read complete instructions/literature along with all the possible adverse reactions/side effects for all the Medicines you take and that have been prescribed to you. Take any new Medicines after you have completely understood and accept all the possible adverse reactions/side effects.   Please note  You were cared for by a hospitalist during your hospital stay. If you have any questions about your discharge medications or the care you received while you were in the hospital after you are discharged, you can call the unit and asked to speak with the hospitalist on call if the hospitalist that took care of you is not available. Once you are discharged, your primary care physician will handle any further medical issues. Please note that NO REFILLS for any discharge medications will be authorized once you are discharged, as it is imperative that you return to your primary care physician (or establish a relationship with a primary care physician if you do not have one) for your aftercare needs so that they can reassess your need  for medications and monitor your lab values.    Today   CHIEF COMPLAINT:   Chief Complaint  Patient presents with  . Shortness of Breath    HISTORY OF PRESENT ILLNESS:  Jason Hudson  is a 68 y.o. male presents with shortness of breath.   VITAL SIGNS:  Blood pressure (!) 127/92, pulse (!) 111, temperature (!) 97.3 F (36.3 C), temperature source Oral, resp. rate 18, height 5\' 10"  (1.778 m), weight (!) 150.5 kg, SpO2 93 %.  I/O:    Intake/Output Summary (Last 24 hours) at 08/27/2018 1556 Last data filed at 08/27/2018 1326 Gross per 24 hour  Intake 3 ml  Output 5780 ml  Net -5777 ml    PHYSICAL EXAMINATION:  GENERAL:  68 y.o.-year-old patient lying in the bed with no acute distress.  EYES: Pupils equal, round, reactive to light and accommodation. No scleral icterus. Extraocular muscles intact.  HEENT: Head atraumatic, normocephalic. Oropharynx and nasopharynx clear.  NECK:  Supple, no jugular venous distention. No thyroid enlargement, no tenderness.  LUNGS: Normal breath sounds bilaterally, no wheezing, rales,rhonchi or crepitation. No use of accessory muscles of respiration.  CARDIOVASCULAR: S1, S2 tachycardic. No murmurs, rubs, or gallops.  ABDOMEN: Soft, non-tender, non-distended. Bowel sounds present. No organomegaly or mass.  EXTREMITIES: 3+ pedal edema.no cyanosis, or clubbing.  NEUROLOGIC: Cranial nerves II through XII are intact. Muscle strength 5/5 in all extremities. Sensation intact. Gait not checked.  PSYCHIATRIC: The patient is alert and oriented x 3.  SKIN: No obvious rash, lesion, or ulcer.   DATA REVIEW:   CBC Recent Labs  Lab 08/25/18 0459  WBC 17.9*  HGB 11.0*  HCT 35.1*  PLT 304    Chemistries  Recent Labs  Lab 08/25/18 0020  08/27/18 0952  NA 136   < > 136  K 4.5   < > 4.1  CL 97*   < > 98  CO2 26   < > 27  GLUCOSE 338*   < > 264*  BUN 32*   < > 58*  CREATININE 1.93*   < > 2.05*  CALCIUM 9.0   < > 8.9  MG 2.0  --   --    < > =  values in this interval not displayed.    Cardiac Enzymes Recent Labs  Lab 08/25/18 1147  TROPONINI 0.05*    Microbiology Results  Results for orders placed or performed during the hospital encounter of 08/24/18  MRSA PCR Screening     Status: None   Collection Time: 08/24/18 10:57 PM  Result Value Ref Range Status   MRSA by PCR NEGATIVE NEGATIVE Final    Comment:        The GeneXpert MRSA Assay (FDA approved for NASAL specimens only), is one component of a comprehensive MRSA colonization surveillance program. It is not intended to diagnose MRSA infection nor to guide or monitor treatment for MRSA infections. Performed at [facility], [address], [place], [place] [address]        Management plans discussed with the patient, and he told me he is going home.  CODE STATUS:     Code Status Orders  (From admission, onward)         Start     Ordered   08/24/18 1803  Full code  Continuous     08/24/18 1803        Code Status History    Date Active Date Inactive Code Status Order ID Comments User Context   11/06/2017 0006 11/07/2017 1651 Full Code [id number]  Amelia Jo, MD Inpatient   10/20/2017 1214 10/23/2017 1747 Full Code [id number]  Gorden Harms, MD ED   07/27/2016 1515 07/29/2016 1601 Full Code [id number]  Max Sane, MD ED    Advance Directive Documentation     Most Recent Value  Type of Advance Directive  Healthcare Power of Attorney  Pre-existing out of facility DNR order (yellow form or pink MOST form)  -  "MOST" Form in Place?  -      TOTAL TIME TAKING CARE OF THIS PATIENT: 40 minutes.    Loletha Grayer M.D on 08/27/2018 at 3:56 PM  Between 7am to 6pm - Pager - [phone]  After 6pm go to [email or web address] - password [email or web address]  [facility] [employer] Office  [phone]  CC: Primary care physician; [name], MD

## 2018-08-27 NOTE — Plan of Care (Signed)
  Problem: Education: Goal: Knowledge of General Education information will improve Description: Including pain rating scale, medication(s)/side effects and non-pharmacologic comfort measures Outcome: Progressing   Problem: Education: Goal: Ability to verbalize understanding of medication therapies will improve Outcome: Progressing   

## 2018-08-30 DIAGNOSIS — J45909 Unspecified asthma, uncomplicated: Secondary | ICD-10-CM

## 2018-08-30 DIAGNOSIS — J4520 Mild intermittent asthma, uncomplicated: Secondary | ICD-10-CM | POA: Insufficient documentation

## 2018-08-30 DIAGNOSIS — E7800 Pure hypercholesterolemia, unspecified: Secondary | ICD-10-CM | POA: Insufficient documentation

## 2018-08-30 DIAGNOSIS — I2510 Atherosclerotic heart disease of native coronary artery without angina pectoris: Secondary | ICD-10-CM | POA: Insufficient documentation

## 2018-08-30 DIAGNOSIS — I5189 Other ill-defined heart diseases: Secondary | ICD-10-CM | POA: Insufficient documentation

## 2018-08-30 DIAGNOSIS — E669 Obesity, unspecified: Secondary | ICD-10-CM | POA: Insufficient documentation

## 2018-08-30 DIAGNOSIS — IMO0001 Reserved for inherently not codable concepts without codable children: Secondary | ICD-10-CM | POA: Insufficient documentation

## 2018-09-05 ENCOUNTER — Telehealth: Payer: Self-pay | Admitting: Family

## 2018-09-05 ENCOUNTER — Ambulatory Visit: Payer: Medicare HMO | Admitting: Family

## 2018-09-05 NOTE — Telephone Encounter (Signed)
Patient did not show for his Heart Failure Clinic appointment on 09/05/2018. Will attempt to reschedule.  

## 2018-09-06 DIAGNOSIS — N179 Acute kidney failure, unspecified: Secondary | ICD-10-CM | POA: Insufficient documentation

## 2018-09-06 DIAGNOSIS — I5023 Acute on chronic systolic (congestive) heart failure: Secondary | ICD-10-CM | POA: Insufficient documentation

## 2018-09-12 ENCOUNTER — Inpatient Hospital Stay
Admission: EM | Admit: 2018-09-12 | Discharge: 2018-09-15 | DRG: 291 | Disposition: A | Discharge status: Home / Self Care | Payer: Managed Care, Other (non HMO) | Attending: Internal Medicine | Admitting: Internal Medicine

## 2018-09-12 ENCOUNTER — Encounter: Payer: Self-pay | Admitting: Internal Medicine

## 2018-09-12 ENCOUNTER — Emergency Department: Payer: Managed Care, Other (non HMO)

## 2018-09-12 ENCOUNTER — Other Ambulatory Visit: Payer: Self-pay

## 2018-09-12 DIAGNOSIS — R0600 Dyspnea, unspecified: Secondary | ICD-10-CM | POA: Diagnosis present

## 2018-09-12 DIAGNOSIS — N184 Chronic kidney disease, stage 4 (severe): Secondary | ICD-10-CM | POA: Diagnosis present

## 2018-09-12 DIAGNOSIS — I5023 Acute on chronic systolic (congestive) heart failure: Secondary | ICD-10-CM | POA: Diagnosis present

## 2018-09-12 DIAGNOSIS — Z794 Long term (current) use of insulin: Secondary | ICD-10-CM | POA: Diagnosis not present

## 2018-09-12 DIAGNOSIS — J449 Chronic obstructive pulmonary disease, unspecified: Secondary | ICD-10-CM | POA: Diagnosis present

## 2018-09-12 DIAGNOSIS — Z8249 Family history of ischemic heart disease and other diseases of the circulatory system: Secondary | ICD-10-CM

## 2018-09-12 DIAGNOSIS — Z7982 Long term (current) use of aspirin: Secondary | ICD-10-CM

## 2018-09-12 DIAGNOSIS — N179 Acute kidney failure, unspecified: Secondary | ICD-10-CM | POA: Diagnosis present

## 2018-09-12 DIAGNOSIS — Z7951 Long term (current) use of inhaled steroids: Secondary | ICD-10-CM | POA: Diagnosis not present

## 2018-09-12 DIAGNOSIS — G4730 Sleep apnea, unspecified: Secondary | ICD-10-CM | POA: Diagnosis present

## 2018-09-12 DIAGNOSIS — R0603 Acute respiratory distress: Secondary | ICD-10-CM | POA: Diagnosis present

## 2018-09-12 DIAGNOSIS — R0602 Shortness of breath: Secondary | ICD-10-CM

## 2018-09-12 DIAGNOSIS — Z79899 Other long term (current) drug therapy: Secondary | ICD-10-CM | POA: Diagnosis not present

## 2018-09-12 DIAGNOSIS — R0902 Hypoxemia: Secondary | ICD-10-CM | POA: Diagnosis present

## 2018-09-12 DIAGNOSIS — K219 Gastro-esophageal reflux disease without esophagitis: Secondary | ICD-10-CM | POA: Diagnosis present

## 2018-09-12 DIAGNOSIS — D631 Anemia in chronic kidney disease: Secondary | ICD-10-CM | POA: Diagnosis present

## 2018-09-12 DIAGNOSIS — Z6841 Body Mass Index (BMI) 40.0 and over, adult: Secondary | ICD-10-CM

## 2018-09-12 DIAGNOSIS — E782 Mixed hyperlipidemia: Secondary | ICD-10-CM | POA: Diagnosis present

## 2018-09-12 DIAGNOSIS — I130 Hypertensive heart and chronic kidney disease with heart failure and stage 1 through stage 4 chronic kidney disease, or unspecified chronic kidney disease: Secondary | ICD-10-CM | POA: Diagnosis not present

## 2018-09-12 DIAGNOSIS — E1122 Type 2 diabetes mellitus with diabetic chronic kidney disease: Secondary | ICD-10-CM | POA: Diagnosis present

## 2018-09-12 DIAGNOSIS — I248 Other forms of acute ischemic heart disease: Secondary | ICD-10-CM | POA: Diagnosis present

## 2018-09-12 DIAGNOSIS — I480 Paroxysmal atrial fibrillation: Secondary | ICD-10-CM | POA: Diagnosis present

## 2018-09-12 DIAGNOSIS — I509 Heart failure, unspecified: Secondary | ICD-10-CM

## 2018-09-12 LAB — CBC
HCT: 33.9 % — ABNORMAL LOW (ref 39.0–52.0)
Hemoglobin: 10.7 g/dL — ABNORMAL LOW (ref 13.0–17.0)
MCH: 30.3 pg (ref 26.0–34.0)
MCHC: 31.6 g/dL (ref 30.0–36.0)
MCV: 96 fL (ref 80.0–100.0)
Platelets: 190 10*3/uL (ref 150–400)
RBC: 3.53 MIL/uL — ABNORMAL LOW (ref 4.22–5.81)
RDW: 17.2 % — ABNORMAL HIGH (ref 11.5–15.5)
WBC: 10.1 10*3/uL (ref 4.0–10.5)
nRBC: 0 % (ref 0.0–0.2)

## 2018-09-12 LAB — BLOOD GAS, VENOUS
Acid-Base Excess: 5.5 mmol/L — ABNORMAL HIGH (ref 0.0–2.0)
BICARBONATE: 31.1 mmol/L — AB (ref 20.0–28.0)
Delivery systems: POSITIVE
FIO2: 0.4
O2 Saturation: 96.4 %
PO2 VEN: 83 mmHg — AB (ref 32.0–45.0)
Patient temperature: 37
pCO2, Ven: 48 mmHg (ref 44.0–60.0)
pH, Ven: 7.42 (ref 7.250–7.430)

## 2018-09-12 LAB — TROPONIN I
Troponin I: <0.03 ng/mL (ref <0.03)
Troponin I: <0.03 ng/mL (ref <0.03)
Troponin I: <0.03 ng/mL (ref <0.03)

## 2018-09-12 LAB — COMPREHENSIVE METABOLIC PANEL
ALT: 30 U/L (ref 0–44)
AST: 29 U/L (ref 15–41)
Albumin: 3.6 g/dL (ref 3.5–5.0)
Alkaline Phosphatase: 74 U/L (ref 38–126)
Anion gap: 8 (ref 5–15)
BUN: 39 mg/dL — ABNORMAL HIGH (ref 8–23)
CO2: 30 mmol/L (ref 22–32)
Calcium: 8.5 mg/dL — ABNORMAL LOW (ref 8.9–10.3)
Chloride: 100 mmol/L (ref 98–111)
Creatinine, Ser: 2.55 mg/dL — ABNORMAL HIGH (ref 0.61–1.24)
GFR calc non Af Amer: 25 mL/min — ABNORMAL LOW (ref >60)
GFR, EST AFRICAN AMERICAN: 29 mL/min — AB (ref >60)
Glucose, Bld: 218 mg/dL — ABNORMAL HIGH (ref 70–99)
Potassium: 4 mmol/L (ref 3.5–5.1)
Sodium: 138 mmol/L (ref 135–145)
Total Bilirubin: 0.6 mg/dL (ref 0.3–1.2)
Total Protein: 7.2 g/dL (ref 6.5–8.1)

## 2018-09-12 LAB — GLUCOSE, CAPILLARY
Glucose-Capillary: 121 mg/dL — ABNORMAL HIGH (ref 70–99)
Glucose-Capillary: 135 mg/dL — ABNORMAL HIGH (ref 70–99)
Glucose-Capillary: 130 mg/dL — ABNORMAL HIGH (ref 70–99)

## 2018-09-12 LAB — BRAIN NATRIURETIC PEPTIDE: B Natriuretic Peptide: 220 pg/mL — ABNORMAL HIGH (ref 0.0–100.0)

## 2018-09-12 MED ORDER — CARVEDILOL 25 MG PO TABS
25.0000 mg | ORAL_TABLET | Freq: Two times a day (BID) | ORAL | Status: DC
  Administered 2018-09-12 – 2018-09-15 (×6): 25 mg via ORAL
  Filled 2018-09-12 (×6): qty 1

## 2018-09-12 MED ORDER — DOXAZOSIN MESYLATE 4 MG PO TABS
4.0000 mg | ORAL_TABLET | Freq: Every day | ORAL | Status: DC
  Administered 2018-09-12 – 2018-09-14 (×3): 4 mg via ORAL
  Filled 2018-09-12 (×4): qty 1

## 2018-09-12 MED ORDER — SODIUM CHLORIDE 0.9% FLUSH
3.0000 mL | Freq: Two times a day (BID) | INTRAVENOUS | Status: DC
  Administered 2018-09-12 – 2018-09-15 (×7): 3 mL via INTRAVENOUS

## 2018-09-12 MED ORDER — FLUTICASONE PROPIONATE 50 MCG/ACT NA SUSP
2.0000 | Freq: Two times a day (BID) | NASAL | Status: DC
  Administered 2018-09-12 – 2018-09-15 (×6): 2 via NASAL
  Filled 2018-09-12: qty 16

## 2018-09-12 MED ORDER — RISAQUAD PO CAPS
1.0000 | ORAL_CAPSULE | Freq: Every day | ORAL | Status: DC
  Administered 2018-09-13 – 2018-09-15 (×3): 1 via ORAL
  Filled 2018-09-12 (×3): qty 1

## 2018-09-12 MED ORDER — ASPIRIN EC 81 MG PO TBEC
81.0000 mg | DELAYED_RELEASE_TABLET | Freq: Every day | ORAL | Status: DC
  Administered 2018-09-13 – 2018-09-15 (×3): 81 mg via ORAL
  Filled 2018-09-12 (×3): qty 1

## 2018-09-12 MED ORDER — NITROGLYCERIN 0.4 MG SL SUBL: 0.4000 mg | SUBLINGUAL_TABLET | SUBLINGUAL | Status: DC | PRN

## 2018-09-12 MED ORDER — ACETAMINOPHEN 325 MG PO TABS
650.0000 mg | ORAL_TABLET | ORAL | Status: DC | PRN
  Administered 2018-09-12 – 2018-09-14 (×2): 650 mg via ORAL
  Filled 2018-09-12 (×2): qty 2

## 2018-09-12 MED ORDER — INSULIN ASPART 100 UNIT/ML ~~LOC~~ SOLN: 0.0000 [IU] | Freq: Every day | SUBCUTANEOUS | Status: DC

## 2018-09-12 MED ORDER — DILTIAZEM HCL ER COATED BEADS 120 MG PO CP24
360.0000 mg | ORAL_CAPSULE | Freq: Every day | ORAL | Status: DC
  Administered 2018-09-13 – 2018-09-15 (×3): 360 mg via ORAL
  Filled 2018-09-12 (×3): qty 3

## 2018-09-12 MED ORDER — FUROSEMIDE 10 MG/ML IJ SOLN
60.0000 mg | Freq: Two times a day (BID) | INTRAMUSCULAR | Status: AC
  Administered 2018-09-12 – 2018-09-13 (×3): 60 mg via INTRAVENOUS
  Filled 2018-09-12 (×3): qty 6

## 2018-09-12 MED ORDER — BENZONATATE 100 MG PO CAPS
100.0000 mg | ORAL_CAPSULE | Freq: Two times a day (BID) | ORAL | Status: DC
  Administered 2018-09-12 – 2018-09-15 (×7): 100 mg via ORAL
  Filled 2018-09-12 (×7): qty 1

## 2018-09-12 MED ORDER — ALBUTEROL SULFATE (2.5 MG/3ML) 0.083% IN NEBU
2.5000 mg | INHALATION_SOLUTION | Freq: Four times a day (QID) | RESPIRATORY_TRACT | Status: DC | PRN
  Administered 2018-09-14 (×2): 2.5 mg via RESPIRATORY_TRACT
  Filled 2018-09-12 (×2): qty 3

## 2018-09-12 MED ORDER — SODIUM CHLORIDE 0.9 % IV SOLN: 250.0000 mL | INTRAVENOUS | Status: DC | PRN

## 2018-09-12 MED ORDER — ONDANSETRON HCL 4 MG/2ML IJ SOLN: 4.0000 mg | Freq: Four times a day (QID) | INTRAMUSCULAR | Status: DC | PRN

## 2018-09-12 MED ORDER — GLIPIZIDE ER 10 MG PO TB24
10.0000 mg | ORAL_TABLET | Freq: Every day | ORAL | Status: DC
  Administered 2018-09-13 – 2018-09-15 (×3): 10 mg via ORAL
  Filled 2018-09-12 (×3): qty 1

## 2018-09-12 MED ORDER — MOMETASONE FURO-FORMOTEROL FUM 200-5 MCG/ACT IN AERO
2.0000 | INHALATION_SPRAY | Freq: Two times a day (BID) | RESPIRATORY_TRACT | Status: DC
  Administered 2018-09-12 – 2018-09-15 (×6): 2 via RESPIRATORY_TRACT
  Filled 2018-09-12: qty 8.8

## 2018-09-12 MED ORDER — FUROSEMIDE 10 MG/ML IJ SOLN
60.0000 mg | Freq: Once | INTRAMUSCULAR | Status: AC
  Administered 2018-09-12: 60 mg via INTRAVENOUS
  Filled 2018-09-12: qty 8

## 2018-09-12 MED ORDER — HEPARIN SODIUM (PORCINE) 5000 UNIT/ML IJ SOLN
5000.0000 [IU] | Freq: Three times a day (TID) | INTRAMUSCULAR | Status: DC
  Filled 2018-09-12 (×5): qty 1

## 2018-09-12 MED ORDER — POTASSIUM CHLORIDE CRYS ER 10 MEQ PO TBCR
10.0000 meq | EXTENDED_RELEASE_TABLET | Freq: Every day | ORAL | Status: DC
  Administered 2018-09-13 – 2018-09-15 (×3): 10 meq via ORAL
  Filled 2018-09-12 (×3): qty 1

## 2018-09-12 MED ORDER — SODIUM CHLORIDE 0.9% FLUSH: 3.0000 mL | INTRAVENOUS | Status: DC | PRN

## 2018-09-12 MED ORDER — LORATADINE 10 MG PO TABS
10.0000 mg | ORAL_TABLET | Freq: Every day | ORAL | Status: DC
  Administered 2018-09-13 – 2018-09-15 (×3): 10 mg via ORAL
  Filled 2018-09-12 (×3): qty 1

## 2018-09-12 MED ORDER — VITAMIN D3 25 MCG (1000 UNIT) PO TABS
2000.0000 [IU] | ORAL_TABLET | Freq: Every day | ORAL | Status: DC
  Administered 2018-09-12 – 2018-09-14 (×3): 2000 [IU] via ORAL
  Filled 2018-09-12 (×4): qty 2

## 2018-09-12 MED ORDER — UMECLIDINIUM-VILANTEROL 62.5-25 MCG/INH IN AEPB
1.0000 | INHALATION_SPRAY | Freq: Every day | RESPIRATORY_TRACT | Status: DC
  Administered 2018-09-13 – 2018-09-15 (×3): 1 via RESPIRATORY_TRACT
  Filled 2018-09-12: qty 14

## 2018-09-12 MED ORDER — AMIODARONE HCL 200 MG PO TABS
200.0000 mg | ORAL_TABLET | Freq: Every day | ORAL | Status: DC
  Administered 2018-09-13 – 2018-09-15 (×3): 200 mg via ORAL
  Filled 2018-09-12 (×3): qty 1

## 2018-09-12 MED ORDER — INSULIN ASPART 100 UNIT/ML ~~LOC~~ SOLN
0.0000 [IU] | Freq: Three times a day (TID) | SUBCUTANEOUS | Status: DC
  Administered 2018-09-12: 3 [IU] via SUBCUTANEOUS
  Administered 2018-09-13 – 2018-09-14 (×5): 4 [IU] via SUBCUTANEOUS
  Administered 2018-09-14: 7 [IU] via SUBCUTANEOUS
  Administered 2018-09-15: 4 [IU] via SUBCUTANEOUS
  Filled 2018-09-12 (×9): qty 1

## 2018-09-12 MED ORDER — ADULT MULTIVITAMIN W/MINERALS CH
1.0000 | ORAL_TABLET | Freq: Every day | ORAL | Status: DC
  Administered 2018-09-13 – 2018-09-15 (×3): 1 via ORAL
  Filled 2018-09-12 (×3): qty 1

## 2018-09-12 NOTE — ED Triage Notes (Addendum)
Pt arrives to ED from home via [place]. Per EMS pt has a hx of CHF; bilateral lower extremities +4 pitting edema, pt is not on Oxygen at home. Per pt.he has a hx of Afib.  he was admitted at [facility] for a SOB on 09/06/2018, he has been having episodes of SOB since last week. Upon arrival MD at bedside, pt denies CP.

## 2018-09-12 NOTE — Progress Notes (Signed)
Advanced care plan.  Purpose of the Encounter: CODE STATUS  Parties in Attendance: Patient  Patient's Decision Capacity: Good  Subjective/Patient's story: Presented to emergency room for shortness of breath and swelling of the legs   Objective/Medical story Patient has a heart failure exacerbation Needs IV Lasix for diuresis Initially was put on BiPAP for shortness of breath later on weaned to oxygen via nasal cannula Needs cardiology evaluation  Goals of care determination:  Advance care directives goals of care and treatment plan discussed Patient for now wants everything done which includes CPR, intubation ventilator if the need arises   CODE STATUS: Full code   Time spent discussing advanced care planning: 16 minutes

## 2018-09-12 NOTE — Care Management Note (Addendum)
Case Management Note  Patient Details  Name: Vennie Waymire MRN: [id number] Date of Birth: 07-Mar-1951  Subjective/Objective:                 CHF consult   Action/Plan:  Spoke w patient at bedside. He states he is from home lives with girlfriend and her son. 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 He describes himself as independent. He states that he does not weigh daily, discussed importance of daily weights. He states that his scale usually is 5-10 pounds off a day, may benefit from new scale and follow up reinforcement of dry weight education.  States that he monitors his BP and blood sugars daily.  He obtains meds through [facility], except Levemir which he gets through [facility] as it is less expensive for him there.  He states he "kinda watches" what he eats, and doesn't use "a lot" of salt. RD consult placed. Has PCP and cardiology apt this week.  Expected Discharge Date:                  Expected Discharge Plan:     In-House Referral:     Discharge planning Services  CM Consult  Post Acute Care Choice:    Choice offered to:     DME Arranged:    DME Agency:     HH Arranged:    HH Agency:     Status of Service:  In process, will continue to follow  If discussed at Long Length of Stay Meetings, dates discussed:    Additional Comments:  Carles Collet, RN 09/12/2018, 3:27 PM

## 2018-09-12 NOTE — ED Notes (Signed)
ED TO INPATIENT HANDOFF REPORT  Name/Age/Gender Jason Hudson 68 y.o. male  Code Status Code Status History    Date Active Date Inactive Code Status Order ID Comments User Context   08/24/2018 1803 08/27/2018 1701 Full Code [id number]  Saundra Shelling, MD ED   11/06/2017 0006 11/07/2017 1651 Full Code [id number]  Amelia Jo, MD Inpatient   10/20/2017 1214 10/23/2017 1747 Full Code [id number]  Salary, Avel Peace, MD ED   07/27/2016 1515 07/29/2016 1601 Full Code [id number]  Max Sane, MD ED    Advance Directive Documentation     Most Recent Value  Type of Advance Directive  Living will, Healthcare Power of Attorney  Pre-existing out of facility DNR order (yellow form or pink MOST form)  -  "MOST" Form in Place?  -      Home/SNF/Other Home  Chief Complaint sob  Level of Care/Admitting Diagnosis ED Disposition    ED Disposition Condition [place]: [place] [[id number]]  Level of Care: Telemetry [5]  Diagnosis: Acute exacerbation of CHF (congestive heart failure) [facility]) [[id number]]  Admitting Physician: Saundra Shelling [[id number]]  Attending Physician: Saundra Shelling [[id number]]  Estimated length of stay: past midnight tomorrow  Certification:: I certify this patient will need inpatient services for at least 2 midnights  PT Class (Do Not Modify): Inpatient [101]  PT Acc Code (Do Not Modify): Private [1]       Medical History Past Medical History:  Diagnosis Date  . CHF (congestive heart failure) ([place])   . COPD (chronic obstructive pulmonary disease) ([place])   . Diabetes mellitus without complication ([place])   . Gastroesophageal reflux disease 12/15/2017  . Hypertension   . Melena 11/05/2017  . Pneumonia 07/27/2016    Allergies Allergies  Allergen Reactions  . Red Dye Anaphylaxis    "not as allergic to it as I used to be. I can take a little of it"    IV Location/Drains/Wounds Patient Lines/Drains/Airways Status   Active  Line/Drains/Airways    Name:   Placement date:   Placement time:   Site:   Days:   Peripheral IV 09/12/18 Right Forearm   09/12/18    0939    Forearm   less than 1          Labs/Imaging Results for orders placed or performed during the hospital encounter of 09/12/18 (from the past 48 hour(s))  CBC     Status: Abnormal   Collection Time: 09/12/18  9:14 AM  Result Value Ref Range   WBC 10.1 4.0 - 10.5 K/uL   RBC 3.53 (L) 4.22 - 5.81 MIL/uL   Hemoglobin 10.7 (L) 13.0 - 17.0 g/dL   HCT 33.9 (L) 39.0 - 52.0 %   MCV 96.0 80.0 - 100.0 fL   MCH 30.3 26.0 - 34.0 pg   MCHC 31.6 30.0 - 36.0 g/dL   RDW 17.2 (H) 11.5 - 15.5 %   Platelets 190 150 - 400 K/uL   nRBC 0.0 0.0 - 0.2 %    Comment: Performed at [facility], [address] [place], [place] [address]  Comprehensive metabolic panel     Status: Abnormal   Collection Time: 09/12/18  9:14 AM  Result Value Ref Range   Sodium 138 135 - 145 mmol/L   Potassium 4.0 3.5 - 5.1 mmol/L   Chloride 100 98 - 111 mmol/L   CO2 30 22 - 32 mmol/L   Glucose, Bld 218 (H) 70 - 99 mg/dL  BUN 39 (H) 8 - 23 mg/dL   Creatinine, Ser 2.55 (H) 0.61 - 1.24 mg/dL   Calcium 8.5 (L) 8.9 - 10.3 mg/dL   Total Protein 7.2 6.5 - 8.1 g/dL   Albumin 3.6 3.5 - 5.0 g/dL   AST 29 15 - 41 U/L   ALT 30 0 - 44 U/L   Alkaline Phosphatase 74 38 - 126 U/L   Total Bilirubin 0.6 0.3 - 1.2 mg/dL   GFR calc non Af Amer 25 (L) >60 mL/min   GFR calc Af Amer 29 (L) >60 mL/min   Anion gap 8 5 - 15    Comment: Performed at [facility], [address] [place], [place] [address]  Troponin I - ONCE - STAT     Status: None   Collection Time: 09/12/18  9:14 AM  Result Value Ref Range   Troponin I <0.03 <0.03 ng/mL    Comment: Performed at [facility], [address] [place], [place] [address]  Brain natriuretic peptide     Status: Abnormal   Collection Time: 09/12/18  9:14 AM  Result Value Ref Range   B Natriuretic Peptide 220.0 (H) 0.0 - 100.0  pg/mL    Comment: Performed at [facility], [address] [place], [place] [address]  Blood gas, venous     Status: Abnormal   Collection Time: 09/12/18  9:15 AM  Result Value Ref Range   FIO2 0.40    Delivery systems BILEVEL POSITIVE AIRWAY PRESSURE    pH, Ven 7.42 7.250 - 7.430   pCO2, Ven 48 44.0 - 60.0 mmHg   pO2, Ven 83.0 (H) 32.0 - 45.0 mmHg   Bicarbonate 31.1 (H) 20.0 - 28.0 mmol/L   Acid-Base Excess 5.5 (H) 0.0 - 2.0 mmol/L   O2 Saturation 96.4 %   Patient temperature 37.0    Collection site VEIN    Sample type VENOUS     Comment: Performed at [facility], [address] [place], [place] [address]   Dg Chest Portable 1 View  Result Date: 09/12/2018 CLINICAL DATA:  Shortness of breath, 4+ pitting edema, history CHF, COPD, hypertension, diabetes mellitus, GERD EXAM: PORTABLE CHEST 1 VIEW COMPARISON:  Portable exam 0925 hours compared to 08/24/2018 FINDINGS: Enlargement of cardiac silhouette with pulmonary vascular congestion. Mediastinal contours normal. Chronic eventration of the RIGHT diaphragm with mild RIGHT basilar atelectasis. No acute infiltrate, pleural effusion or pneumothorax. Bones unremarkable. IMPRESSION: Enlargement of cardiac silhouette with pulmonary vascular congestion. Mild RIGHT basilar atelectasis. Electronically Signed   By: Lavonia Dana M.D.   On: 09/12/2018 09:37    Pending Labs [employer] (From admission, onward)    Start     Ordered   Signed and Corporate treasurer  Daily,   R     Signed and Held   Signed and Held  Troponin I - Now Then Q6H  Now then every 6 hours,   R     Signed and Held   Signed and Held  CBC  (heparin)  Once,   R    Comments:  Baseline for heparin therapy IF NOT ALREADY DRAWN.  Notify MD if PLT < 100 K.    Signed and Held   Signed and Held  Creatinine, serum  (heparin)  Once,   R    Comments:  Baseline for heparin therapy IF NOT ALREADY DRAWN.    Signed and Held           Vitals/Pain Today's  Vitals   09/12/18 1035 09/12/18 1100 09/12/18 1111 09/12/18 1130  BP: 135/90 114/76  (!) 125/107  Pulse: 93 (!) 41  (!) 104  Resp: (!) 24 16  (!) 21  SpO2: 100% 99%  95%  Weight:      Height:      PainSc:   0-No pain     Isolation Precautions No active isolations  Medications Medications  insulin aspart (novoLOG) injection 0-20 Units (has no administration in time range)  insulin aspart (novoLOG) injection 0-5 Units (has no administration in time range)  furosemide (LASIX) injection 60 mg (60 mg Intravenous Given 09/12/18 1104)    Mobility Pt doesn't use a mobility device.

## 2018-09-12 NOTE — ED Notes (Signed)
Family at bedside. 

## 2018-09-12 NOTE — ED Notes (Signed)
Pt is no acute distress at this time. Pt is at 95% SpO2 RA. This RN will continue to monitor pt.

## 2018-09-12 NOTE — H&P (Addendum)
[facility] [place] NAME: Jason Hudson    MR#:  [id number]  DATE OF BIRTH:  26-Jul-1951  DATE OF ADMISSION:  09/12/2018  PRIMARY CARE PHYSICIAN: [name], MD   REQUESTING/REFERRING PHYSICIAN:   CHIEF COMPLAINT:   Chief Complaint  Patient presents with  . Shortness of Breath    HISTORY OF PRESENT ILLNESS: Jason Hudson  is a 68 y.o. male with a known history of chronic systolic heart failure with EF of 40 to 45%, COPD, type 2 diabetes mellitus, GERD, hypertension, not on home oxygen presented to emergency room for shortness of breath and low oxygen saturation.  Also had swelling in the lower extremities and has been retaining fluid.  He has been put on BiPAP and stabilized and later on weaned to oxygen via nasal cannula in the emergency room.  Was given IV Lasix 60 IV for diuresis.  Patient [facility] cardiology as outpatient.  He usually takes oral torsemide for diuresis as outpatient.  No complaints of any chest pain.  Hospitalist service was consulted for further care.  PAST MEDICAL HISTORY:   Past Medical History:  Diagnosis Date  . CHF (congestive heart failure) ([place])   . COPD (chronic obstructive pulmonary disease) ([place])   . Diabetes mellitus without complication ([place])   . Gastroesophageal reflux disease 12/15/2017  . Hypertension   . Melena 11/05/2017  . Pneumonia 07/27/2016    PAST SURGICAL HISTORY:  Past Surgical History:  Procedure Laterality Date  . COLONOSCOPY WITH PROPOFOL N/A 11/07/2017   Procedure: COLONOSCOPY WITH PROPOFOL;  Surgeon: [name], MD;  Location: [facility] ENDOSCOPY;  Service: Gastroenterology;  Laterality: N/A;  . ESOPHAGOGASTRODUODENOSCOPY (EGD) WITH PROPOFOL N/A 11/07/2017   Procedure: ESOPHAGOGASTRODUODENOSCOPY (EGD) WITH PROPOFOL;  Surgeon: [name], MD;  Location: [facility] ENDOSCOPY;  Service: Gastroenterology;  Laterality: N/A;  . LEFT HEART CATH AND CORONARY ANGIOGRAPHY N/A  10/23/2017   Procedure: LEFT HEART CATH AND CORONARY ANGIOGRAPHY;  Surgeon: Isaias Cowman, MD;  Location: [facility] CV LAB;  Service: Cardiovascular;  Laterality: N/A;    SOCIAL HISTORY:  Social History   Tobacco Use  . Smoking status: Never Smoker  . Smokeless tobacco: Never Used  Substance Use Topics  . Alcohol use: No    FAMILY HISTORY:  Family History  Problem Relation Age of Onset  . Cancer Mother   . CVA Father   . Heart disease Father     DRUG ALLERGIES:  Allergies  Allergen Reactions  . Red Dye Anaphylaxis    "not as allergic to it as I used to be. I can take a little of it"    REVIEW OF SYSTEMS:   CONSTITUTIONAL: No fever, fatigue or weakness.  EYES: No blurred or double vision.  EARS, NOSE, AND THROAT: No tinnitus or ear pain.  RESPIRATORY: No cough,has shortness of breath,  No wheezing or hemoptysis.  CARDIOVASCULAR: No chest pain, has orthopnea, edema.  GASTROINTESTINAL: No nausea, vomiting, diarrhea or abdominal pain.  GENITOURINARY: No dysuria, hematuria.  ENDOCRINE: No polyuria, nocturia,  HEMATOLOGY: No anemia, easy bruising or bleeding SKIN: No rash or lesion. MUSCULOSKELETAL: No joint pain or arthritis.   NEUROLOGIC: No tingling, numbness, weakness.  PSYCHIATRY: No anxiety or depression.   MEDICATIONS AT HOME:  Prior to Admission medications   Medication Sig Start Date End Date Taking? Authorizing Provider  albuterol (PROVENTIL HFA;VENTOLIN HFA) 108 (90 Base) MCG/ACT inhaler Inhale 1-2 puffs into the lungs every 6 (six) hours as needed  for wheezing or shortness of breath.   Yes [provider]  amiodarone (PACERONE) 400 MG tablet One tablet twice a day for one week, then one tablet daily afterwards Patient taking differently: Take 200 mg by mouth daily. One tablet twice a day for one week, then one tablet daily afterwards 08/27/18  Yes Wieting, Richard, MD  aspirin EC 81 MG tablet Take 1 tablet (81 mg total) by mouth daily.  07/02/18 07/02/19 Yes Earleen Newport, MD  bifidobacterium infantis (ALIGN) capsule Take 1 capsule by mouth daily.   Yes [provider]  carvedilol (COREG) 25 MG tablet Take 1 tablet (25 mg total) by mouth 2 (two) times daily. Patient taking differently: Take 25 mg by mouth daily.  08/27/18  Yes Wieting, Richard, MD  Chlorphen-Phenyleph-ASA (ALKA-SELTZER PLUS COLD PO) Take 1 tablet by mouth daily as needed (congestion).    Yes [provider]  Cholecalciferol (VITAMIN D3) 2000 units TABS Take 2,000 Units by mouth at bedtime.   Yes [provider]  diltiazem (TIAZAC) 360 MG 24 hr capsule Take 360 mg by mouth daily.    Yes [provider]  doxazosin (CARDURA) 4 MG tablet Take 1 tablet (4 mg total) by mouth at bedtime. 08/27/18  Yes Wieting, Richard, MD  fluticasone (FLONASE) 50 MCG/ACT nasal spray Place 2 sprays into both nostrils 2 (two) times daily.    Yes [provider]  glipiZIDE (GLUCOTROL XL) 10 MG 24 hr tablet Take 10 mg by mouth daily.    Yes [provider]  insulin detemir (LEVEMIR) 100 UNIT/ML injection Inject 0.8 mLs (80 Units total) into the skin daily. Patient taking differently: Inject 80 Units into the skin daily. Patient has been increasing dose to 100 units due to high blood sugar for the past couple of days 11/07/17  Yes Wieting, Richard, MD  ipratropium (ATROVENT) 0.02 % nebulizer solution Take 0.5 mg by nebulization every 6 (six) hours as needed for wheezing or shortness of breath.   Yes [provider]  loratadine (CLARITIN) 10 MG tablet Take 10 mg by mouth daily.   Yes [provider]  multivitamin (ONE-A-DAY MEN'S) TABS tablet Take 1 tablet by mouth daily.   Yes [provider]  nitroGLYCERIN (NITROSTAT) 0.4 MG SL tablet Place 1 tablet (0.4 mg total) under the tongue every 5 (five) minutes as needed for chest pain. 10/23/17  Yes Mody, Ulice Bold, MD  potassium chloride (K-DUR,KLOR-CON) 10 MEQ tablet Take  1 tablet (10 mEq total) by mouth daily. 10/23/17  Yes Mody, Ulice Bold, MD  torsemide (DEMADEX) 20 MG tablet Take 80 mg by mouth daily.    Yes [provider]  umeclidinium-vilanterol (ANORO ELLIPTA) 62.5-25 MCG/INH AEPB Inhale 1 puff into the lungs daily.   Yes [provider]  amoxicillin-clavulanate (AUGMENTIN) 500-125 MG tablet Take 1 tablet (500 mg total) by mouth 2 (two) times daily. Patient not taking: Reported on 09/12/2018 08/27/18   Loletha Grayer, MD  benzonatate (TESSALON) 100 MG capsule Take 100 mg by mouth 2 (two) times daily.     [provider]  budesonide-formoterol (SYMBICORT) 160-4.5 MCG/ACT inhaler Inhale 2 puffs into the lungs 2 (two) times daily.    [provider]  Dulaglutide (TRULICITY) 1.5 TI/4.5YK SOPN Inject 1.5 mg into the skin every Wednesday. 03/28/18   [provider]  pantoprazole (PROTONIX) 40 MG tablet Take 1 tablet (40 mg total) by mouth daily. Patient not taking: Reported on 09/12/2018 11/07/17 11/07/18  Loletha Grayer, MD  PHYSICAL EXAMINATION:   VITAL SIGNS: Blood pressure (!) 125/107, pulse (!) 104, resp. rate (!) 21, height 5\' 9"  (1.753 m), weight (!) 149.7 kg, SpO2 95 %.  GENERAL:  68 y.o.-year-old patient lying in the bed with no acute distress.  EYES: Pupils equal, round, reactive to light and accommodation. No scleral icterus. Extraocular muscles intact.  HEENT: Head atraumatic, normocephalic. Oropharynx and nasopharynx clear.  NECK:  Supple, no jugular venous distention. No thyroid enlargement, no tenderness.  LUNGS: Decreased breath sounds bilaterally, bibasilar crepitations heard. No use of accessory muscles of respiration.  CARDIOVASCULAR: S1, S2 normal. No murmurs, rubs, or gallops.  ABDOMEN: Soft, nontender, nondistended. Bowel sounds present. No organomegaly or mass.  EXTREMITIES:Bilateral pedal edema 2 plus, no cyanosis, or clubbing.  NEUROLOGIC: Cranial nerves II through XII are intact. Muscle  strength 5/5 in all extremities. Sensation intact. Gait not checked.  PSYCHIATRIC: The patient is alert and oriented x 3.  SKIN: No obvious rash, lesion, or ulcer.   LABORATORY PANEL:   CBC Recent Labs  Lab 09/12/18 0914  WBC 10.1  HGB 10.7*  HCT 33.9*  PLT 190  MCV 96.0  MCH 30.3  MCHC 31.6  RDW 17.2*   ------------------------------------------------------------------------------------------------------------------  Chemistries  Recent Labs  Lab 09/12/18 0914  NA 138  K 4.0  CL 100  CO2 30  GLUCOSE 218*  BUN 39*  CREATININE 2.55*  CALCIUM 8.5*  AST 29  ALT 30  ALKPHOS 74  BILITOT 0.6   ------------------------------------------------------------------------------------------------------------------ estimated creatinine clearance is 41.2 mL/min (A) (by C-G formula based on SCr of 2.55 mg/dL (H)). ------------------------------------------------------------------------------------------------------------------ No results for input(s): TSH, T4TOTAL, T3FREE, THYROIDAB in the last 72 hours.  Invalid input(s): FREET3   Coagulation profile No results for input(s): INR, PROTIME in the last 168 hours. ------------------------------------------------------------------------------------------------------------------- No results for input(s): DDIMER in the last 72 hours. -------------------------------------------------------------------------------------------------------------------  Cardiac Enzymes Recent Labs  Lab 09/12/18 0914  TROPONINI <0.03   ------------------------------------------------------------------------------------------------------------------ Invalid input(s): POCBNP  ---------------------------------------------------------------------------------------------------------------  Urinalysis    Component Value Date/Time   COLORURINE COLORLESS (A) 07/02/2018 0944   APPEARANCEUR CLEAR (A) 07/02/2018 0944   LABSPEC 1.006 07/02/2018 0944    PHURINE 8.0 07/02/2018 0944   GLUCOSEU NEGATIVE 07/02/2018 0944   HGBUR NEGATIVE 07/02/2018 0944   BILIRUBINUR NEGATIVE 07/02/2018 0944   KETONESUR NEGATIVE 07/02/2018 0944   PROTEINUR NEGATIVE 07/02/2018 0944   NITRITE NEGATIVE 07/02/2018 0944   LEUKOCYTESUR NEGATIVE 07/02/2018 0944     RADIOLOGY: Dg Chest Portable 1 View  Result Date: 09/12/2018 CLINICAL DATA:  Shortness of breath, 4+ pitting edema, history CHF, COPD, hypertension, diabetes mellitus, GERD EXAM: PORTABLE CHEST 1 VIEW COMPARISON:  Portable exam 0925 hours compared to 08/24/2018 FINDINGS: Enlargement of cardiac silhouette with pulmonary vascular congestion. Mediastinal contours normal. Chronic eventration of the RIGHT diaphragm with mild RIGHT basilar atelectasis. No acute infiltrate, pleural effusion or pneumothorax. Bones unremarkable. IMPRESSION: Enlargement of cardiac silhouette with pulmonary vascular congestion. Mild RIGHT basilar atelectasis. Electronically Signed   By: Lavonia Dana M.D.   On: 09/12/2018 09:37    EKG: Orders placed or performed during the hospital encounter of 09/12/18  . ED EKG  . ED EKG  . EKG 12-Lead  . EKG 12-Lead  . EKG 12-Lead  . EKG 12-Lead    IMPRESSION AND PLAN:  68 year old male patient with history of chronic systolic heart failure with EF of 40 to 45%, type 2 diabetes mellitus, COPD, GERD, hypertension presented to the emergency room for shortness of breath and edema in the legs  -  Acute on chronic systolic heart failure exacerbation Admit patient to telemetry Diurese patient with IV Lasix 60 MDQ 12 hourly Input output chart Daily body weight Cardiology consult Resume beta-blocker Supplement potassium Recently patient had echocardiogram in August 25, 2018 ACE inhibitor could not be added secondary to renal insufficiency Message sent via epic haiku  -Type 2 diabetes mellitus Diabetic diet with sliding scale coverage with insulin along with oral hypoglycemic  drugs  -Emphysema stable Continue home dose inhalers and nebulization treatments  -Hypertension Continue Coreg for control of blood pressure  -DVT prophylaxis subcu heparin  -Acute on chronic kidney disease stage III Monitor renal function Avoid nephrotoxic medication   All the records are reviewed and case discussed with ED provider. Management plans discussed with the patient, family and they are in agreement.  CODE STATUS:Full code Code Status History    Date Active Date Inactive Code Status Order ID Comments User Context   08/24/2018 1803 08/27/2018 1701 Full Code [id number]  Saundra Shelling, MD ED   11/06/2017 0006 11/07/2017 1651 Full Code [id number]  Amelia Jo, MD Inpatient   10/20/2017 1214 10/23/2017 1747 Full Code [id number]  Salary, Avel Peace, MD ED   07/27/2016 1515 07/29/2016 1601 Full Code [id number]  Max Sane, MD ED    Advance Directive Documentation     Most Recent Value  Type of Advance Directive  Living will, Healthcare Power of Attorney  Pre-existing out of facility DNR order (yellow form or pink MOST form)  -  "MOST" Form in Place?  -       TOTAL TIME TAKING CARE OF THIS PATIENT: 52 minutes.    Saundra Shelling M.D on 09/12/2018 at 1:21 PM  Between 7am to 6pm - Pager - [phone]  After 6pm go to [email or web address] - password [email or web address] [facility]  Office  [phone]  CC: Primary care physician; [name], MD

## 2018-09-12 NOTE — ED Notes (Signed)
Pt taken off BiPAP at 1059hrs per order by [facility] MD.

## 2018-09-12 NOTE — ED Notes (Signed)
Pt asked to sit at the side of bed because he was uncomfortable on the stretcher.This RN placed a recliner in the room for pt, pt is comfortable and call bell within pt arm reach . Pt denies  Dizziness, CP or SOB at this time.Family is at bedside.  This RN will continue to monitor pt.

## 2018-09-12 NOTE — ED Notes (Signed)
Ice chips given to pt.  

## 2018-09-12 NOTE — Plan of Care (Signed)
Nutrition Education Note  RD consulted for nutrition education regarding CHF.  68 y/o male admitted with CHF   RD provided "Low Sodium Nutrition Therapy" handout from the Academy of [employer]etics. Reviewed patient's dietary recall. Provided examples on ways to decrease sodium intake in diet. Discouraged intake of processed foods and use of salt shaker. Encouraged fresh fruits and vegetables as well as whole grain sources of carbohydrates to maximize fiber intake.   RD discussed why it is important for patient to adhere to diet recommendations, and emphasized the role of fluids, foods to avoid, and importance of weighing self daily. Teach back method used.  Expect fair compliance.  Body mass index is 50.36 kg/m. Pt meets criteria for morbid obesity based on current BMI.  Current diet order is HH, patient is consuming approximately 100% of meals at this time. Labs and medications reviewed. No further nutrition interventions warranted at this time. RD contact information provided. If additional nutrition issues arise, please re-consult RD.   Koleen Distance MS, RD, LDN Pager #- [phone] Office#- [phone] After Hours Pager: [phone]

## 2018-09-12 NOTE — Consult Note (Signed)
[facility] Cardiology Consultation Note  Patient ID: Jason Hudson, MRN: [id number], DOB/AGE: Mar 20, 1951 68 y.o. Admit date: 09/12/2018   Date of Consult: 09/12/2018 Primary Physician: [name], MD Primary Cardiologist: [facility]  Chief Complaint:  Chief Complaint  Patient presents with  . Shortness of Breath   Reason for Consult: Congestive heart failure  HPI: 68 y.o. male with known paroxysmal nonvalvular atrial fibrillation chronic kidney disease stage IV anemia and severe COPD on appropriate medication management with recurrent episodes of shortness of breath lower extremity edema.  The patient was unable to do any physical activity this morning due to the fact that he has severe shortness of breath lower extremity edema and hypoxia.  When seen in the emergency room he had a chest x-ray showing pulmonary edema and an EKG with atrial fibrillation with controlled ventricular rate and very frequent preventricular contractions.  The patient previously has had amiodarone for heart rate control and maintenance of normal sinus rhythm which appears not to be working well.  In addition to that he is not on anticoagulation due to the fact that he has previous bleeding gastric ulcers.  Currently he feels much better after intravenous Lasix and further diuresis.  He is hemodynamically stable with a troponin of 0.05 consistent with demand ischemia rather than acute coronary syndrome  Past Medical History:  Diagnosis Date  . CHF (congestive heart failure) ([place])   . COPD (chronic obstructive pulmonary disease) ([place])   . Diabetes mellitus without complication ([place])   . Gastroesophageal reflux disease 12/15/2017  . Hypertension   . Melena 11/05/2017  . Pneumonia 07/27/2016      Surgical History:  Past Surgical History:  Procedure Laterality Date  . COLONOSCOPY WITH PROPOFOL N/A 11/07/2017   Procedure: COLONOSCOPY WITH PROPOFOL;  Surgeon: [name], MD;  Location: [facility] ENDOSCOPY;   Service: Gastroenterology;  Laterality: N/A;  . ESOPHAGOGASTRODUODENOSCOPY (EGD) WITH PROPOFOL N/A 11/07/2017   Procedure: ESOPHAGOGASTRODUODENOSCOPY (EGD) WITH PROPOFOL;  Surgeon: [name], MD;  Location: [facility] ENDOSCOPY;  Service: Gastroenterology;  Laterality: N/A;  . LEFT HEART CATH AND CORONARY ANGIOGRAPHY N/A 10/23/2017   Procedure: LEFT HEART CATH AND CORONARY ANGIOGRAPHY;  Surgeon: Isaias Cowman, MD;  Location: [place] CV LAB;  Service: Cardiovascular;  Laterality: N/A;     Home Meds: Prior to Admission medications   Medication Sig Start Date End Date Taking? Authorizing Provider  albuterol (PROVENTIL HFA;VENTOLIN HFA) 108 (90 Base) MCG/ACT inhaler Inhale 1-2 puffs into the lungs every 6 (six) hours as needed for wheezing or shortness of breath.   Yes [provider]  amiodarone (PACERONE) 400 MG tablet One tablet twice a day for one week, then one tablet daily afterwards Patient taking differently: Take 200 mg by mouth daily. One tablet twice a day for one week, then one tablet daily afterwards 08/27/18  Yes Wieting, Richard, MD  aspirin EC 81 MG tablet Take 1 tablet (81 mg total) by mouth daily. 07/02/18 07/02/19 Yes Earleen Newport, MD  bifidobacterium infantis (ALIGN) capsule Take 1 capsule by mouth daily.   Yes [provider]  carvedilol (COREG) 25 MG tablet Take 1 tablet (25 mg total) by mouth 2 (two) times daily. Patient taking differently: Take 25 mg by mouth daily.  08/27/18  Yes Wieting, Richard, MD  Chlorphen-Phenyleph-ASA (ALKA-SELTZER PLUS COLD PO) Take 1 tablet by mouth daily as needed (congestion).    Yes [provider]  Cholecalciferol (VITAMIN D3) 2000 units TABS Take 2,000 Units by mouth at bedtime.   Yes  [provider]  diltiazem (TIAZAC) 360 MG 24 hr capsule Take 360 mg by mouth daily.    Yes [provider]  doxazosin (CARDURA) 4 MG tablet Take 1 tablet (4 mg total) by mouth at bedtime. 08/27/18  Yes Wieting,  Richard, MD  fluticasone (FLONASE) 50 MCG/ACT nasal spray Place 2 sprays into both nostrils 2 (two) times daily.    Yes [provider]  glipiZIDE (GLUCOTROL XL) 10 MG 24 hr tablet Take 10 mg by mouth daily.    Yes [provider]  insulin detemir (LEVEMIR) 100 UNIT/ML injection Inject 0.8 mLs (80 Units total) into the skin daily. Patient taking differently: Inject 80 Units into the skin daily. Patient has been increasing dose to 100 units due to high blood sugar for the past couple of days 11/07/17  Yes Wieting, Richard, MD  ipratropium (ATROVENT) 0.02 % nebulizer solution Take 0.5 mg by nebulization every 6 (six) hours as needed for wheezing or shortness of breath.   Yes [provider]  loratadine (CLARITIN) 10 MG tablet Take 10 mg by mouth daily.   Yes [provider]  multivitamin (ONE-A-DAY MEN'S) TABS tablet Take 1 tablet by mouth daily.   Yes [provider]  nitroGLYCERIN (NITROSTAT) 0.4 MG SL tablet Place 1 tablet (0.4 mg total) under the tongue every 5 (five) minutes as needed for chest pain. 10/23/17  Yes Mody, Ulice Bold, MD  potassium chloride (K-DUR,KLOR-CON) 10 MEQ tablet Take 1 tablet (10 mEq total) by mouth daily. 10/23/17  Yes Mody, Ulice Bold, MD  torsemide (DEMADEX) 20 MG tablet Take 80 mg by mouth daily.    Yes [provider]  umeclidinium-vilanterol (ANORO ELLIPTA) 62.5-25 MCG/INH AEPB Inhale 1 puff into the lungs daily.   Yes [provider]  amoxicillin-clavulanate (AUGMENTIN) 500-125 MG tablet Take 1 tablet (500 mg total) by mouth 2 (two) times daily. Patient not taking: Reported on 09/12/2018 08/27/18   Loletha Grayer, MD  benzonatate (TESSALON) 100 MG capsule Take 100 mg by mouth 2 (two) times daily.     [provider]  budesonide-formoterol (SYMBICORT) 160-4.5 MCG/ACT inhaler Inhale 2 puffs into the lungs 2 (two) times daily.    [provider]  Dulaglutide (TRULICITY) 1.5 SW/9.6PR SOPN Inject 1.5 mg into  the skin every Wednesday. 03/28/18   [provider]  pantoprazole (PROTONIX) 40 MG tablet Take 1 tablet (40 mg total) by mouth daily. Patient not taking: Reported on 09/12/2018 11/07/17 11/07/18  Loletha Grayer, MD    Inpatient Medications:  . [START ON 09/13/2018] acidophilus  1 capsule Oral Daily  . [START ON 09/13/2018] amiodarone  200 mg Oral Daily  . [START ON 09/13/2018] aspirin EC  81 mg Oral Daily  . benzonatate  100 mg Oral BID  . carvedilol  25 mg Oral BID  . cholecalciferol  2,000 Units Oral QHS  . [START ON 09/13/2018] diltiazem  360 mg Oral Daily  . doxazosin  4 mg Oral QHS  . fluticasone  2 spray Each Nare BID  . furosemide  60 mg Intravenous BID  . [START ON 09/13/2018] glipiZIDE  10 mg Oral Daily  . heparin  5,000 Units Subcutaneous Q8H  . insulin aspart  0-20 Units Subcutaneous TID WC  . insulin aspart  0-5 Units Subcutaneous QHS  . [START ON 09/13/2018] loratadine  10 mg Oral Daily  . mometasone-formoterol  2 puff Inhalation BID  . [START ON 09/13/2018] multivitamin with minerals  1 tablet Oral Daily  . [START ON 09/13/2018] potassium  chloride  10 mEq Oral Daily  . sodium chloride flush  3 mL Intravenous Q12H  . [START ON 09/13/2018] umeclidinium-vilanterol  1 puff Inhalation Daily   . sodium chloride      Allergies:  Allergies  Allergen Reactions  . Red Dye Anaphylaxis    "not as allergic to it as I used to be. I can take a little of it"    Social History   Socioeconomic History  . Marital status: Single    Spouse name: Not on file  . Number of children: Not on file  . Years of education: Not on file  . Highest education level: Not on file  Occupational History  . Not on file  Social Needs  . Financial resource strain: Not on file  . Food insecurity:    Worry: Not on file    Inability: Not on file  . Transportation needs:    Medical: Not on file    Non-medical: Not on file  Tobacco Use  . Smoking status: Never Smoker  . Smokeless tobacco:  Never Used  Substance and Sexual Activity  . Alcohol use: No  . Drug use: Never  . Sexual activity: Not on file  Lifestyle  . Physical activity:    Days per week: Not on file    Minutes per session: Not on file  . Stress: Not on file  Relationships  . Social connections:    Talks on phone: Not on file    Gets together: Not on file    Attends religious service: Not on file    Active member of club or organization: Not on file    Attends meetings of clubs or organizations: Not on file    Relationship status: Not on file  . Intimate partner violence:    Fear of current or ex partner: Not on file    Emotionally abused: Not on file    Physically abused: Not on file    Forced sexual activity: Not on file  Other Topics Concern  . Not on file  Social History Narrative  . Not on file     Family History  Problem Relation Age of Onset  . Cancer Mother   . CVA Father   . Heart disease Father      Review of Systems Positive for shortness of breath PND orthopnea Negative for: General:  chills, fever, night sweats or weight changes.  Cardiovascular: Positive for PND orthopnea negative for syncope dizziness  Dermatological skin lesions rashes Respiratory: Cough congestion Urologic: Frequent urination urination at night and hematuria Abdominal: negative for nausea, vomiting, diarrhea, bright red blood per rectum, melena, or hematemesis Neurologic: negative for visual changes, and/or hearing changes  All other systems reviewed and are otherwise negative except as noted above.  Labs: Recent Labs    09/12/18 0914 09/12/18 1504  TROPONINI <0.03 <0.03   Lab Results  Component Value Date   WBC 10.1 09/12/2018   HGB 10.7 (L) 09/12/2018   HCT 33.9 (L) 09/12/2018   MCV 96.0 09/12/2018   PLT 190 09/12/2018    Recent Labs  Lab 09/12/18 0914  NA 138  K 4.0  CL 100  CO2 30  BUN 39*  CREATININE 2.55*  CALCIUM 8.5*  PROT 7.2  BILITOT 0.6  ALKPHOS 74  ALT 30  AST 29   GLUCOSE 218*   Lab Results  Component Value Date   CHOL 170 10/21/2017   HDL 42 10/21/2017   LDLCALC 101 (H) 10/21/2017  TRIG 135 10/21/2017   No results found for: DDIMER  Radiology/Studies:  Dg Chest Portable 1 View  Result Date: 09/12/2018 CLINICAL DATA:  Shortness of breath, 4+ pitting edema, history CHF, COPD, hypertension, diabetes mellitus, GERD EXAM: PORTABLE CHEST 1 VIEW COMPARISON:  Portable exam 0925 hours compared to 08/24/2018 FINDINGS: Enlargement of cardiac silhouette with pulmonary vascular congestion. Mediastinal contours normal. Chronic eventration of the RIGHT diaphragm with mild RIGHT basilar atelectasis. No acute infiltrate, pleural effusion or pneumothorax. Bones unremarkable. IMPRESSION: Enlargement of cardiac silhouette with pulmonary vascular congestion. Mild RIGHT basilar atelectasis. Electronically Signed   By: Lavonia Dana M.D.   On: 09/12/2018 09:37   Dg Chest Portable 1 View  Result Date: 08/24/2018 CLINICAL DATA:  Chest pain and shortness of breath EXAM: PORTABLE CHEST 1 VIEW COMPARISON:  03/07/2018 FINDINGS: Cardiomegaly and right hemidiaphragm elevation as before. No superimposed acute pneumonia, collapse or consolidation. Negative for edema, effusion or pneumothorax. Trachea is midline. IMPRESSION: Stable cardiomegaly. No interval change or superimposed acute process. Electronically Signed   By: Jerilynn Mages.  Shick M.D.   On: 08/24/2018 10:25    EKG: Atrial fibrillation with controlled ventricular rate nonspecific ST changes and preventricular contractions  Weights: Filed Weights   09/12/18 0917 09/12/18 1420  Weight: (!) 149.7 kg (!) 154.7 kg     Physical Exam: Blood pressure 127/83, pulse (!) 103, temperature 97.6 F (36.4 C), temperature source Oral, resp. rate 19, height 5\' 9"  (1.753 m), weight (!) 154.7 kg, SpO2 94 %. Body mass index is 50.36 kg/m. General: Well developed, well nourished, in no acute distress. Head eyes ears nose throat: Normocephalic,  atraumatic, sclera non-icteric, no xanthomas, nares are without discharge. No apparent thyromegaly and/or mass  Lungs: Normal respiratory effort.  Few wheezes, basilar rales, no rhonchi.  Heart: Regular with normal S1 S2. no murmur gallop, no rub, PMI is normal size and placement, carotid upstroke normal without bruit, jugular venous pressure is normal Abdomen: Soft, non-tender, non-distended with normoactive bowel sounds. No hepatomegaly. No rebound/guarding. No obvious abdominal masses. Abdominal aorta is normal size without bruit Extremities: 1+ [place] 1 a. no cyanosis, no clubbing, no ulcers  Peripheral : 2+ bilateral upper extremity pulses, 2+ bilateral femoral pulses, 0 + bilateral dorsal pedal pulse Neuro: Alert and oriented. No facial asymmetry. No focal deficit. Moves all extremities spontaneously. Musculoskeletal: Normal muscle tone without kyphosis Psych:  Responds to questions appropriately with a normal affect.    Assessment: 68 year old male with severe obesity chronic obstructive pulmonary disease paroxysmal nonvalvular atrial fibrillation essential hypertension mixed hyperlipidemia with acute on chronic systolic dysfunction congestive heart failure multifactorial in nature including chronic kidney disease anemia and atrial fibrillation as well as sleep apnea and COPD without evidence of myocardial infarction  Plan: 1.  Continue intravenous Lasix for further treatment of acute on chronic systolic dysfunction heart failure 2.  Continue heart rate control of atrial fibrillation with diltiazem 3.  No anticoagulation at this time due to the patient having bleeding ulcers in the past 4.  Continue amiodarone at least at this time for further treatment of heart rate control and possible spontaneous conversion to normal sinus rhythm 5.  Nephrology consultation for further evaluation and treatment options of severe chronic kidney disease stage IV needing further intervention and diuretic  treatment 6.  Further treatment options after above  Signed, Corey Skains M.D. [facility] 09/12/2018, 5:42 PM

## 2018-09-12 NOTE — ED Notes (Signed)
Pt ambulated in hallway with a steady gait. SpO2 from 97% to 90%. Pt denied dizziness, CP. Pt stated "I'm feeling a little winded and short out of breath". This RN will continue to monitor.

## 2018-09-12 NOTE — ED Provider Notes (Signed)
[facility] Emergency Department Provider Note  Time seen: 9:22 AM  I have reviewed the triage vital signs and the nursing notes.   HISTORY  Chief Complaint Shortness of Breath    HPI Jason Hudson is a 68 y.o. male with a past medical history of CHF, COPD, diabetes, hypertension, obesity, presents to the emergency department for shortness of breath.  According to the patient he was hospitalized last week at [facility] for CHF exacerbation.  States since going home he initially felt better but has progressively worsened over the past few days, today his shortness of breath became significant and he called EMS.  EMS states initial room air saturation between 90 to 94%, but significant work of breathing.  Placed on CPAP with improvement in breathing.  Upon arrival patient states continues to feel short of breath although denies any chest pain now or at any point.  Patient states his leg swelling although his significant is actually decreased from how it was last week.   Past Medical History:  Diagnosis Date  . CHF (congestive heart failure) ([place])   . COPD (chronic obstructive pulmonary disease) ([place])   . Diabetes mellitus without complication ([place])   . Gastroesophageal reflux disease 12/15/2017  . Hypertension   . Melena 11/05/2017  . Pneumonia 07/27/2016    Patient Active Problem List   Diagnosis Date Noted  . COPD exacerbation ([place]) 08/24/2018  . Hypertension 01/19/2018  . Chronic obstructive lung disease ([place]) 12/15/2017  . Gastroesophageal reflux disease 12/15/2017  . Obstructive sleep apnea of adult 12/15/2017  . Melena 11/05/2017  . CHF (congestive heart failure) ([place]) 10/20/2017  . Pneumonia 07/27/2016    Past Surgical History:  Procedure Laterality Date  . COLONOSCOPY WITH PROPOFOL N/A 11/07/2017   Procedure: COLONOSCOPY WITH PROPOFOL;  Surgeon: [name], MD;  Location: [facility] ENDOSCOPY;  Service: Gastroenterology;  Laterality: N/A;  .  ESOPHAGOGASTRODUODENOSCOPY (EGD) WITH PROPOFOL N/A 11/07/2017   Procedure: ESOPHAGOGASTRODUODENOSCOPY (EGD) WITH PROPOFOL;  Surgeon: [name], MD;  Location: [facility] ENDOSCOPY;  Service: Gastroenterology;  Laterality: N/A;  . LEFT HEART CATH AND CORONARY ANGIOGRAPHY N/A 10/23/2017   Procedure: LEFT HEART CATH AND CORONARY ANGIOGRAPHY;  Surgeon: Isaias Cowman, MD;  Location: [place] CV LAB;  Service: Cardiovascular;  Laterality: N/A;    Prior to Admission medications   Medication Sig Start Date End Date Taking? Authorizing Provider  albuterol (PROVENTIL HFA;VENTOLIN HFA) 108 (90 Base) MCG/ACT inhaler Inhale 1-2 puffs into the lungs every 6 (six) hours as needed for wheezing or shortness of breath.    [provider]  amiodarone (PACERONE) 400 MG tablet One tablet twice a day for one week, then one tablet daily afterwards 08/27/18   Loletha Grayer, MD  amoxicillin-clavulanate (AUGMENTIN) 500-125 MG tablet Take 1 tablet (500 mg total) by mouth 2 (two) times daily. 08/27/18   Loletha Grayer, MD  aspirin EC 81 MG tablet Take 1 tablet (81 mg total) by mouth daily. 07/02/18 07/02/19  Earleen Newport, MD  benzonatate (TESSALON) 100 MG capsule Take 100 mg by mouth 2 (two) times daily.     [provider]  bifidobacterium infantis (ALIGN) capsule Take 1 capsule by mouth daily.    [provider]  budesonide-formoterol (SYMBICORT) 160-4.5 MCG/ACT inhaler Inhale 2 puffs into the lungs 2 (two) times daily.    [provider]  carvedilol (COREG) 25 MG tablet Take 1 tablet (25 mg total) by mouth 2 (two) times daily. 08/27/18   Loletha Grayer, MD  Chlorphen-Phenyleph-ASA [place]  PLUS COLD PO) Take 1 tablet by mouth daily as needed (congestion).     [provider]  Cholecalciferol (VITAMIN D3) 2000 units TABS Take 2,000 Units by mouth at bedtime.    [provider]  diltiazem (TIAZAC) 240 MG 24 hr capsule Take 240 mg by mouth daily.     [provider]  doxazosin (CARDURA) 4 MG tablet Take 1 tablet (4 mg total) by mouth at bedtime. 08/27/18   Loletha Grayer, MD  Dulaglutide (TRULICITY) 1.5 WI/0.9BD SOPN Inject 1.5 mg into the skin every Wednesday. 03/28/18   [provider]  fluticasone (FLONASE) 50 MCG/ACT nasal spray Place 2 sprays into both nostrils 2 (two) times daily.     [provider]  glipiZIDE (GLUCOTROL XL) 10 MG 24 hr tablet Take 10 mg by mouth daily.     [provider]  insulin detemir (LEVEMIR) 100 UNIT/ML injection Inject 0.8 mLs (80 Units total) into the skin daily. 11/07/17   Loletha Grayer, MD  ipratropium (ATROVENT) 0.02 % nebulizer solution Take 0.5 mg by nebulization every 6 (six) hours as needed for wheezing or shortness of breath.    [provider]  loratadine (CLARITIN) 10 MG tablet Take 10 mg by mouth daily.    [provider]  multivitamin (ONE-A-DAY MEN'S) TABS tablet Take 1 tablet by mouth daily.    [provider]  nitroGLYCERIN (NITROSTAT) 0.4 MG SL tablet Place 1 tablet (0.4 mg total) under the tongue every 5 (five) minutes as needed for chest pain. 10/23/17   Bettey Costa, MD  pantoprazole (PROTONIX) 40 MG tablet Take 1 tablet (40 mg total) by mouth daily. 11/07/17 11/07/18  Loletha Grayer, MD  potassium chloride (K-DUR,KLOR-CON) 10 MEQ tablet Take 1 tablet (10 mEq total) by mouth daily. 10/23/17   Bettey Costa, MD  torsemide (DEMADEX) 20 MG tablet Take 80 mg by mouth daily.     [provider]  umeclidinium-vilanterol (ANORO ELLIPTA) 62.5-25 MCG/INH AEPB Inhale 1 puff into the lungs daily.    [provider]    Allergies  Allergen Reactions  . Red Dye Anaphylaxis    "not as allergic to it as I used to be. I can take a little of it"    No family history on file.  Social History Social History   Tobacco Use  . Smoking status: Never Smoker  . Smokeless tobacco: Never Used  Substance Use Topics  . Alcohol use: No   . Drug use: Never    Review of Systems Constitutional: Negative for fever. Cardiovascular: Negative for chest pain. Respiratory: Positive for shortness of breath. Gastrointestinal: Negative for abdominal pain, vomiting  Musculoskeletal: Positive for lower extremity edema, improved since last week per patient. Skin: Negative for skin complaints  Neurological: Negative for headache All other ROS negative  ____________________________________________   PHYSICAL EXAM:  VITAL SIGNS: ED Triage Vitals  Enc Vitals Group     BP 09/12/18 0916 132/79     Pulse Rate 09/12/18 0916 89     Resp 09/12/18 0916 20     Temp --      Temp src --      SpO2 09/12/18 0913 100 %     Weight 09/12/18 0917 (!) 330 lb (149.7 kg)     Height 09/12/18 0917 5\' 9"  (1.753 m)     Head Circumference --      Peak Flow --      Pain Score --      Pain Loc --  Pain Edu? --      Excl. in [place]? --    Constitutional: Alert and oriented. Well appearing and in no distress. Eyes: Normal exam ENT   Head: Normocephalic and atraumatic   Mouth/Throat: Mucous membranes are moist. Cardiovascular: Normal rate, regular rhythm. No murmur Respiratory: Mild tachypnea, increased work of breathing.  No obvious wheeze rales or rhonchi. Gastrointestinal: Soft and nontender. No distention.  Obese. Musculoskeletal: 3-4+ lower extremity edema, equal bilaterally, minimal erythema bilaterally, patient states this is considerably improved from last week. Neurologic:  Normal speech and language. No gross focal neurologic deficits Skin:  Skin is warm, dry and intact.  Psychiatric: Mood and affect are normal.  ____________________________________________    EKG  EKG viewed and interpreted by myself shows atrial flutter at 115 bpm with a narrow QRS, normal axis, normal intervals, nonspecific ST changes occasional PVC.  ____________________________________________    RADIOLOGY  Chest x-ray shows vascular  congestion  ____________________________________________   INITIAL IMPRESSION / ASSESSMENT AND PLAN / ED COURSE  Pertinent labs & imaging results that were available during my care of the patient were reviewed by me and considered in my medical decision making (see chart for details).  Patient presents to the emergency department via emergency traffic for shortness of breath worsening over the past several days but ongoing times at least 1 week.  Differential would include CHF exacerbation, pneumonia, pneumothorax, ACS, URI.  We will check labs, chest x-ray.  Patient currently on BiPAP, doing well.  Chest x-ray shows vascular congestion.  Patient is now off BiPAP, doing much better satting around 95% on room air.  However with ambulation patient had to stop due to significant work of breathing, shortness of breath desatted to 90%.  Given the patient's recent hospitalization, with increased work of breathing shortness of breath and nearly hypoxic will admit to the hospitalist service for continued treatment.     CRITICAL CARE Performed by: Harvest Dark   Total critical care time: 30 minutes  Critical care time was exclusive of separately billable procedures and treating other patients.  Critical care was necessary to treat or prevent imminent or life-threatening deterioration.  Critical care was time spent personally by me on the following activities: development of treatment plan with patient and/or surrogate as well as nursing, discussions with consultants, evaluation of patient's response to treatment, examination of patient, obtaining history from patient or surrogate, ordering and performing treatments and interventions, ordering and review of laboratory studies, ordering and review of radiographic studies, pulse oximetry and re-evaluation of patient's condition.   ____________________________________________   FINAL CLINICAL IMPRESSION(S) / ED DIAGNOSES  CHF  exacerbation Dyspnea   Harvest Dark, MD 09/12/18 1308

## 2018-09-12 NOTE — ED Notes (Signed)
Pt denies CP a this time. Pt states he uses a CPAP machine at home occasionally when needed.  Upon assessment pt presents afebrile at 98.38F axillary.

## 2018-09-13 ENCOUNTER — Other Ambulatory Visit: Payer: Self-pay

## 2018-09-13 LAB — BASIC METABOLIC PANEL
Anion gap: 9 (ref 5–15)
BUN: 40 mg/dL — ABNORMAL HIGH (ref 8–23)
CO2: 29 mmol/L (ref 22–32)
CREATININE: 2.68 mg/dL — AB (ref 0.61–1.24)
Calcium: 8.5 mg/dL — ABNORMAL LOW (ref 8.9–10.3)
Chloride: 101 mmol/L (ref 98–111)
GFR calc Af Amer: 27 mL/min — ABNORMAL LOW (ref >60)
GFR calc non Af Amer: 24 mL/min — ABNORMAL LOW (ref >60)
Glucose, Bld: 167 mg/dL — ABNORMAL HIGH (ref 70–99)
Potassium: 3.7 mmol/L (ref 3.5–5.1)
Sodium: 139 mmol/L (ref 135–145)

## 2018-09-13 LAB — GLUCOSE, CAPILLARY
Glucose-Capillary: 181 mg/dL — ABNORMAL HIGH (ref 70–99)
Glucose-Capillary: 178 mg/dL — ABNORMAL HIGH (ref 70–99)
Glucose-Capillary: 180 mg/dL — ABNORMAL HIGH (ref 70–99)
Glucose-Capillary: 155 mg/dL — ABNORMAL HIGH (ref 70–99)

## 2018-09-13 LAB — TROPONIN I: Troponin I: <0.03 ng/mL (ref <0.03)

## 2018-09-13 MED ORDER — FUROSEMIDE 40 MG PO TABS
80.0000 mg | ORAL_TABLET | Freq: Two times a day (BID) | ORAL | Status: DC
  Administered 2018-09-14 – 2018-09-15 (×3): 80 mg via ORAL
  Filled 2018-09-13 (×3): qty 2

## 2018-09-13 NOTE — Plan of Care (Signed)
  Problem: Education: Goal: Ability to verbalize understanding of medication therapies will improve Outcome: Progressing Note:  Patient has watched several EMMI educational videos this afternoon of medication-related therapies for his particular disease condition. Verbalizes a greater understanding of information now. Will continue to address educational concerns. Wenda Low [facility]

## 2018-09-13 NOTE — Consult Note (Signed)
[facility]  CONSULT NOTE    Date: 09/13/2018                  Patient Name:  Jason Hudson  MRN: [id number]  DOB: [birth year]  Age / Sex: 68 y.o., male         PCP: [name], MD                 Service Requesting Consult: Dr. Stark Jock                 Reason for Consult: Acute renal failure            History of Present Illness: Jason Hudson is a 68 y.o. white male with diabetes mellitus type II, hypertension, GERD, COPD, congestive heart failure , who was admitted to [facility] on 09/12/2018 for Dyspnea, unspecified type [R06.00] Congestive heart failure, unspecified HF chronicity, unspecified heart failure type ([place]) [I50.9]  Cousin at bedside.   Patient was recently admitted to [facility] from 1/16 to 1/19 for same presentation. He was sent home on torsemide 20mg  PO daily. He went to work yesterday where he had dyspnea on exertion and sent to the ED.   Started on IV furosemide. Urine output of 3.7 years.    Medications: Outpatient medications: Medications Prior to Admission  Medication Sig Dispense Refill Last Dose  . albuterol (PROVENTIL HFA;VENTOLIN HFA) 108 (90 Base) MCG/ACT inhaler Inhale 1-2 puffs into the lungs every 6 (six) hours as needed for wheezing or shortness of breath.   prn at prn  . amiodarone (PACERONE) 400 MG tablet One tablet twice a day for one week, then one tablet daily afterwards (Patient taking differently: Take 200 mg by mouth daily. One tablet twice a day for one week, then one tablet daily afterwards) 67 tablet 0 09/12/2018 at 0700  . aspirin EC 81 MG tablet Take 1 tablet (81 mg total) by mouth daily. 150 tablet 2 09/12/2018 at 0700  . bifidobacterium infantis (ALIGN) capsule Take 1 capsule by mouth daily.   09/12/2018 at 0700  . carvedilol (COREG) 25 MG tablet Take 1 tablet (25 mg total) by mouth 2 (two) times daily. (Patient taking differently: Take 25 mg by mouth daily. ) 60 tablet 0 09/12/2018 at 0700  .  Chlorphen-Phenyleph-ASA (ALKA-SELTZER PLUS COLD PO) Take 1 tablet by mouth daily as needed (congestion).    prn at prn  . Cholecalciferol (VITAMIN D3) 2000 units TABS Take 2,000 Units by mouth at bedtime.   09/11/2018 at 2000  . diltiazem (TIAZAC) 360 MG 24 hr capsule Take 360 mg by mouth daily.    09/12/2018 at 0700  . doxazosin (CARDURA) 4 MG tablet Take 1 tablet (4 mg total) by mouth at bedtime. 30 tablet 0 09/11/2018 at 2200  . fluticasone (FLONASE) 50 MCG/ACT nasal spray Place 2 sprays into both nostrils 2 (two) times daily.    09/12/2018 at 0700  . glipiZIDE (GLUCOTROL XL) 10 MG 24 hr tablet Take 10 mg by mouth daily.    09/12/2018 at 0700  . insulin detemir (LEVEMIR) 100 UNIT/ML injection Inject 0.8 mLs (80 Units total) into the skin daily. (Patient taking differently: Inject 80 Units into the skin daily. Patient has been increasing dose to 100 units due to high blood sugar for the past couple of days) 10 mL 11 09/12/2018 at 0700  . ipratropium (ATROVENT) 0.02 % nebulizer solution Take 0.5 mg by nebulization every 6 (six) hours as needed for wheezing or  shortness of breath.   prn at prn  . loratadine (CLARITIN) 10 MG tablet Take 10 mg by mouth daily.   09/12/2018 at 0700  . multivitamin (ONE-A-DAY MEN'S) TABS tablet Take 1 tablet by mouth daily.   09/12/2018 at 0700  . nitroGLYCERIN (NITROSTAT) 0.4 MG SL tablet Place 1 tablet (0.4 mg total) under the tongue every 5 (five) minutes as needed for chest pain. 30 tablet 0 prn at prn  . potassium chloride (K-DUR,KLOR-CON) 10 MEQ tablet Take 1 tablet (10 mEq total) by mouth daily. 30 tablet 0 09/12/2018 at 0700  . torsemide (DEMADEX) 20 MG tablet Take 80 mg by mouth daily.    09/12/2018 at 0700  . umeclidinium-vilanterol (ANORO ELLIPTA) 62.5-25 MCG/INH AEPB Inhale 1 puff into the lungs daily.   09/12/2018 at 0700  . amoxicillin-clavulanate (AUGMENTIN) 500-125 MG tablet Take 1 tablet (500 mg total) by mouth 2 (two) times daily. (Patient not taking: Reported on  09/12/2018) 8 tablet 0 Completed Course at Unknown time  . benzonatate (TESSALON) 100 MG capsule Take 100 mg by mouth 2 (two) times daily.    Not Taking at Unknown time  . budesonide-formoterol (SYMBICORT) 160-4.5 MCG/ACT inhaler Inhale 2 puffs into the lungs 2 (two) times daily.   Not Taking at Unknown time  . Dulaglutide (TRULICITY) 1.5 LA/4.5XM SOPN Inject 1.5 mg into the skin every Wednesday.   Not Taking at Unknown time  . pantoprazole (PROTONIX) 40 MG tablet Take 1 tablet (40 mg total) by mouth daily. (Patient not taking: Reported on 09/12/2018) 30 tablet 0 Not Taking at Unknown time    Current medications: Current Facility-Administered Medications  Medication Dose Route Frequency Provider Last Rate Last Dose  . 0.9 %  sodium chloride infusion  250 mL Intravenous PRN Pyreddy, Reatha Harps, MD      . acetaminophen (TYLENOL) tablet 650 mg  650 mg Oral Q4H PRN Saundra Shelling, MD   650 mg at 09/12/18 2211  . acidophilus (RISAQUAD) capsule 1 capsule  1 capsule Oral Daily Pyreddy, Reatha Harps, MD   1 capsule at 09/13/18 0935  . albuterol (PROVENTIL) (2.5 MG/3ML) 0.083% nebulizer solution 2.5 mg  2.5 mg Inhalation Q6H PRN Pyreddy, Reatha Harps, MD      . amiodarone (PACERONE) tablet 200 mg  200 mg Oral Daily Pyreddy, Reatha Harps, MD   200 mg at 09/13/18 0936  . aspirin EC tablet 81 mg  81 mg Oral Daily Saundra Shelling, MD   81 mg at 09/13/18 0935  . benzonatate (TESSALON) capsule 100 mg  100 mg Oral BID Saundra Shelling, MD   100 mg at 09/13/18 0935  . carvedilol (COREG) tablet 25 mg  25 mg Oral BID Saundra Shelling, MD   25 mg at 09/13/18 0935  . cholecalciferol (VITAMIN D) tablet 2,000 Units  2,000 Units Oral QHS Saundra Shelling, MD   2,000 Units at 09/12/18 2205  . diltiazem (CARDIZEM CD) 24 hr capsule 360 mg  360 mg Oral Daily Pyreddy, Reatha Harps, MD   360 mg at 09/13/18 0935  . doxazosin (CARDURA) tablet 4 mg  4 mg Oral QHS Saundra Shelling, MD   4 mg at 09/12/18 2205  . fluticasone (FLONASE) 50 MCG/ACT nasal spray 2 spray  2 spray  Each Nare BID Saundra Shelling, MD   2 spray at 09/13/18 0934  . furosemide (LASIX) injection 60 mg  60 mg Intravenous BID Saundra Shelling, MD   60 mg at 09/13/18 0834  . glipiZIDE (GLUCOTROL XL) 24 hr tablet 10 mg  10 mg Oral  Daily Pyreddy, Reatha Harps, MD   10 mg at 09/13/18 0935  . heparin injection 5,000 Units  5,000 Units Subcutaneous Q8H Pyreddy, Pavan, MD      . insulin aspart (novoLOG) injection 0-20 Units  0-20 Units Subcutaneous TID WC Saundra Shelling, MD   4 Units at 09/13/18 1151  . insulin aspart (novoLOG) injection 0-5 Units  0-5 Units Subcutaneous QHS Pyreddy, Pavan, MD      . loratadine (CLARITIN) tablet 10 mg  10 mg Oral Daily Pyreddy, Reatha Harps, MD   10 mg at 09/13/18 0935  . mometasone-formoterol (DULERA) 200-5 MCG/ACT inhaler 2 puff  2 puff Inhalation BID Saundra Shelling, MD   2 puff at 09/13/18 8850  . multivitamin with minerals tablet 1 tablet  1 tablet Oral Daily Pyreddy, Reatha Harps, MD   1 tablet at 09/13/18 0935  . nitroGLYCERIN (NITROSTAT) SL tablet 0.4 mg  0.4 mg Sublingual Q5 min PRN Pyreddy, Reatha Harps, MD      . ondansetron (ZOFRAN) injection 4 mg  4 mg Intravenous Q6H PRN Pyreddy, Pavan, MD      . potassium chloride (K-DUR,KLOR-CON) CR tablet 10 mEq  10 mEq Oral Daily Pyreddy, Reatha Harps, MD   10 mEq at 09/13/18 0935  . sodium chloride flush (NS) 0.9 % injection 3 mL  3 mL Intravenous Q12H Pyreddy, Reatha Harps, MD   3 mL at 09/13/18 0935  . sodium chloride flush (NS) 0.9 % injection 3 mL  3 mL Intravenous PRN Pyreddy, Reatha Harps, MD      . umeclidinium-vilanterol (ANORO ELLIPTA) 62.5-25 MCG/INH 1 puff  1 puff Inhalation Daily Saundra Shelling, MD   1 puff at 09/13/18 0933      Allergies: Allergies  Allergen Reactions  . Red Dye Anaphylaxis    "not as allergic to it as I used to be. I can take a little of it"      Past Medical History: Past Medical History:  Diagnosis Date  . CHF (congestive heart failure) ([place])   . COPD (chronic obstructive pulmonary disease) ([place])   . Diabetes mellitus without  complication ([place])   . Gastroesophageal reflux disease 12/15/2017  . Hypertension   . Melena 11/05/2017  . Pneumonia 07/27/2016     Past Surgical History: Past Surgical History:  Procedure Laterality Date  . COLONOSCOPY WITH PROPOFOL N/A 11/07/2017   Procedure: COLONOSCOPY WITH PROPOFOL;  Surgeon: [name], MD;  Location[facility] ENDOSCOPY;  Service: Gastroenterology;  Laterality: N/A;  . ESOPHAGOGASTRODUODENOSCOPY (EGD) WITH PROPOFOL N/A 11/07/2017   Procedure: ESOPHAGOGASTRODUODENOSCOPY (EGD) WITH PROPOFOL;  Surgeon: [name], MD;  Location: [facility] ENDOSCOPY;  Service: Gastroenterology;  Laterality: N/A;  . LEFT HEART CATH AND CORONARY ANGIOGRAPHY N/A 10/23/2017   Procedure: LEFT HEART CATH AND CORONARY ANGIOGRAPHY;  Surgeon: Isaias Cowman, MD;  Location: [facility] CV LAB;  Service: Cardiovascular;  Laterality: N/A;     Family History: Family History  Problem Relation Age of Onset  . Cancer Mother   . CVA Father   . Heart disease Father      Social History: Social History   Socioeconomic History  . Marital status: Single    Spouse name: Not on file  . Number of children: Not on file  . Years of education: Not on file  . Highest education level: Not on file  Occupational History  . Not on file  Social Needs  . Financial resource strain: Not on file  . Food insecurity:    Worry: Not on file    Inability: Not on file  .  Transportation needs:    Medical: Not on file    Non-medical: Not on file  Tobacco Use  . Smoking status: Never Smoker  . Smokeless tobacco: Never Used  Substance and Sexual Activity  . Alcohol use: No  . Drug use: Never  . Sexual activity: Not on file  Lifestyle  . Physical activity:    Days per week: Not on file    Minutes per session: Not on file  . Stress: Not on file  Relationships  . Social connections:    Talks on phone: Not on file    Gets together: Not on file    Attends religious service: Not on file    Active member of  club or organization: Not on file    Attends meetings of clubs or organizations: Not on file    Relationship status: Not on file  . Intimate partner violence:    Fear of current or ex partner: Not on file    Emotionally abused: Not on file    Physically abused: Not on file    Forced sexual activity: Not on file  Other Topics Concern  . Not on file  Social History Narrative  . Not on file     Review of Systems: Review of Systems  Constitutional: Negative.  Negative for chills, diaphoresis, fever, malaise/fatigue and weight loss.  HENT: Negative.  Negative for congestion, ear discharge, ear pain, hearing loss, nosebleeds, sinus pain, sore throat and tinnitus.   Eyes: Negative.  Negative for blurred vision, double vision, photophobia, pain, discharge and redness.  Respiratory: Positive for shortness of breath and wheezing. Negative for cough, hemoptysis, sputum production and stridor.   Cardiovascular: Positive for orthopnea, leg swelling and PND. Negative for chest pain, palpitations and claudication.  Gastrointestinal: Negative.  Negative for abdominal pain, blood in stool, constipation, diarrhea, heartburn, melena, nausea and vomiting.  Genitourinary: Negative.  Negative for dysuria, flank pain, frequency, hematuria and urgency.  Musculoskeletal: Negative.  Negative for back pain, falls, joint pain, myalgias and neck pain.  Skin: Negative.  Negative for itching and rash.  Neurological: Negative.  Negative for dizziness, tingling, tremors, sensory change, speech change, focal weakness, seizures, loss of consciousness, weakness and headaches.  Endo/Heme/Allergies: Negative.  Negative for environmental allergies and polydipsia. Does not bruise/bleed easily.  Psychiatric/Behavioral: Negative.  Negative for depression, hallucinations, memory loss, substance abuse and suicidal ideas. The patient is not nervous/anxious and does not have insomnia.     Vital Signs: Blood pressure (!) 111/94,  pulse 100, temperature (!) 97.5 F (36.4 C), temperature source Oral, resp. rate 18, height 5\' 9"  (1.753 m), weight (!) 153.1 kg, SpO2 95 %.  Weight trends: Filed Weights   09/12/18 4580 09/12/18 1420 09/13/18 0316  Weight: (!) 149.7 kg (!) 154.7 kg (!) 153.1 kg    Physical Exam: General: NAD, sitting in chair  Head: Normocephalic, atraumatic. Moist oral mucosal membranes  Eyes: Anicteric, PERRL  Neck: Supple, trachea midline  Lungs:  Bilateral basilar rales  Heart: irregular  Abdomen:  Abdominal wall edema  Extremities:  + peripheral edema.  Neurologic: Nonfocal, moving all four extremities  Skin: No lesions         Lab results: Basic Metabolic Panel: Recent Labs  Lab 09/12/18 0914 09/13/18 0240  NA 138 139  K 4.0 3.7  CL 100 101  CO2 30 29  GLUCOSE 218* 167*  BUN 39* 40*  CREATININE 2.55* 2.68*  CALCIUM 8.5* 8.5*    Liver Function Tests: Recent Labs  Lab  09/12/18 0914  AST 29  ALT 30  ALKPHOS 74  BILITOT 0.6  PROT 7.2  ALBUMIN 3.6   No results for input(s): LIPASE, AMYLASE in the last 168 hours. No results for input(s): AMMONIA in the last 168 hours.  CBC: Recent Labs  Lab 09/12/18 0914  WBC 10.1  HGB 10.7*  HCT 33.9*  MCV 96.0  PLT 190    Cardiac Enzymes: Recent Labs  Lab 09/12/18 0914 09/12/18 1504 09/12/18 2021 09/13/18 0240  TROPONINI <0.03 <0.03 <0.03 <0.03    BNP: Invalid input(s): POCBNP  CBG: Recent Labs  Lab 09/12/18 1426 09/12/18 1619 09/12/18 2109 09/13/18 0818 09/13/18 1147  GLUCAP 121* 135* 130* 181* 178*    Microbiology: Results for orders placed or performed during the hospital encounter of 08/24/18  MRSA PCR Screening     Status: None   Collection Time: 08/24/18 10:57 PM  Result Value Ref Range Status   MRSA by PCR NEGATIVE NEGATIVE Final    Comment:        The GeneXpert MRSA Assay (FDA approved for NASAL specimens only), is one component of a comprehensive MRSA colonization surveillance program. It  is not intended to diagnose MRSA infection nor to guide or monitor treatment for MRSA infections. Performed at [facility], [address] [place], [place] [id number]     Coagulation Studies: No results for input(s): LABPROT, INR in the last 72 hours.  Urinalysis: No results for input(s): COLORURINE, LABSPEC, PHURINE, GLUCOSEU, HGBUR, BILIRUBINUR, KETONESUR, PROTEINUR, UROBILINOGEN, NITRITE, LEUKOCYTESUR in the last 72 hours.  Invalid input(s): APPERANCEUR    Imaging: Dg Chest Portable 1 View  Result Date: 09/12/2018 CLINICAL DATA:  Shortness of breath, 4+ pitting edema, history CHF, COPD, hypertension, diabetes mellitus, GERD EXAM: PORTABLE CHEST 1 VIEW COMPARISON:  Portable exam 0925 hours compared to 08/24/2018 FINDINGS: Enlargement of cardiac silhouette with pulmonary vascular congestion. Mediastinal contours normal. Chronic eventration of the RIGHT diaphragm with mild RIGHT basilar atelectasis. No acute infiltrate, pleural effusion or pneumothorax. Bones unremarkable. IMPRESSION: Enlargement of cardiac silhouette with pulmonary vascular congestion. Mild RIGHT basilar atelectasis. Electronically Signed   By: Lavonia Dana M.D.   On: 09/12/2018 09:37      Assessment & Plan: Jason Hudson is a 68 y.o. white male with diabetes mellitus type II, hypertension, GERD, COPD, congestive heart failure , who was admitted to [facility] on 09/12/2018 for Dyspnea, unspecified type [R06.00] Congestive heart failure, unspecified HF chronicity, unspecified heart failure type ([place]) [I50.9]  1. Acute renal failure: on chronic kidney disease stage III. Baseline creatinine of 1.39, GFR of 52.  Bland urine Chronic kidney disease secondary to diabetes and hypertension.  Acute renal failure secondary to acute cardiorenal syndrome  2. Hypertension  3. Acute exacerbation of systolic congestive heart failure. With atrial fibrillation On Amiodarone for rhythm Home regimen of torsemide  20mg  daily, carvedilol, doxazosin  4. COPD, not in acute exacerbation  5. Diabetes mellitus type II with chronic kidney disease: hemoglobin A1c of 7.4%. Insulin dependent  Plan: Furosemide IV tonight and then will transition to PO furosemide 80mg  PO. If this is not effective, will add metolazone and/or spironolactone.     LOS: 1 Jason Hudson 1/23/20203:45 PM

## 2018-09-13 NOTE — Progress Notes (Signed)
Patient shown an online EMMI video titled, "CHF: How to Be [place]", at this time. Will continue to monitor. Wenda Low [facility]

## 2018-09-13 NOTE — Care Management Note (Signed)
Case Management Note  Patient Details  Name: Corde Antonini MRN: [id number] Date of Birth: [birth year]  Subjective/Objective:     Patient is independent from home; lives with girlfriend.  Admitted with acute on chronic CHF.  Currently on room air.   Does not have a functioning scale at home.  He was hesitant when he stated he could obtain one; RNCM provided scale for discharge.    He works full time and drives.  Heart failure booklet at bedside.    Action/Plan: He verbalizes understanding of low sodium diet and daily weights.  He states his weight can fluctuate by 10lbs from one day to the next.  He Keeps a log of BS and BP and will add a weigh column.  He obtains his his medications from [facility] without difficulty.  He states he cannot make the heart failure clinic appointments because of his work schedule.  Declines home health services; would not qualify as he is independent and works.  Will continue to follow through DC and assist as needed with DC planning.                Expected Discharge Date:                  Expected Discharge Plan:  Home/Self Care  In-House Referral:     Discharge planning Services  CM Consult  Post Acute Care Choice:    Choice offered to:     DME Arranged:    DME Agency:     HH Arranged:    [place] Agency:     Status of Service:  Completed, signed off  If discussed at [employer] of Stay Meetings, dates discussed:    Additional Comments:  Elza Rafter, RN 09/13/2018, 1:41 PM

## 2018-09-13 NOTE — Progress Notes (Signed)
[facility] [place] NAME: Jason Hudson    MR#:  [id number]  DATE OF BIRTH:  1951-02-18  SUBJECTIVE:  CHIEF COMPLAINT:   Chief Complaint  Patient presents with  . Shortness of Breath   No new complaints.  No fevers.  Lower extremity edema gradually improving with ongoing diuresis with Lasix.  Has lost at least 3 L with ongoing diuresis.  REVIEW OF SYSTEMS:  Review of Systems  Constitutional: Negative for chills and fever.  HENT: Negative for hearing loss and tinnitus.   Eyes: Negative for blurred vision and double vision.  Respiratory: Positive for shortness of breath. Negative for cough and hemoptysis.   Cardiovascular: Positive for leg swelling. Negative for chest pain and palpitations.  Gastrointestinal: Negative for heartburn, nausea and vomiting.  Genitourinary: Negative for dysuria.  Musculoskeletal: Negative for myalgias and neck pain.  Skin: Negative for itching and rash.  Neurological: Negative for dizziness and headaches.  Psychiatric/Behavioral: Negative for depression and hallucinations.      DRUG ALLERGIES:   Allergies  Allergen Reactions  . Red Dye Anaphylaxis    "not as allergic to it as I used to be. I can take a little of it"   VITALS:  Blood pressure 124/80, pulse 96, temperature 97.6 F (36.4 C), temperature source Oral, resp. rate 20, height 5\' 9"  (1.753 m), weight (!) 153.1 kg, SpO2 91 %. PHYSICAL EXAMINATION:   Physical Exam  Constitutional: He is oriented to person, place, and time and well-developed, well-nourished, and in no distress.  Morbidly obese  HENT:  Head: Normocephalic and atraumatic.  Eyes: Pupils are equal, round, and reactive to light. Conjunctivae and EOM are normal.  Neck: Normal range of motion. Neck supple. Tracheal deviation present.  Cardiovascular: Normal rate, regular rhythm and normal heart sounds.  Pulmonary/Chest: Effort normal. He has no wheezes.  Abdominal: Soft. Bowel  sounds are normal. There is no abdominal tenderness.  Musculoskeletal: Normal range of motion.        General: Edema present.  Neurological: He is alert and oriented to person, place, and time.  Skin: Skin is warm. He is not diaphoretic. No erythema.  Psychiatric: Affect and judgment normal.   LABORATORY PANEL:  Male CBC Recent Labs  Lab 09/12/18 0914  WBC 10.1  HGB 10.7*  HCT 33.9*  PLT 190   ------------------------------------------------------------------------------------------------------------------ Chemistries  Recent Labs  Lab 09/12/18 0914 09/13/18 0240  NA 138 139  K 4.0 3.7  CL 100 101  CO2 30 29  GLUCOSE 218* 167*  BUN 39* 40*  CREATININE 2.55* 2.68*  CALCIUM 8.5* 8.5*  AST 29  --   ALT 30  --   ALKPHOS 74  --   BILITOT 0.6  --    RADIOLOGY:  No results found. ASSESSMENT AND PLAN:   68 year old male patient with history of chronic systolic heart failure with EF of 40 to 45%, type 2 diabetes mellitus, COPD, GERD, hypertension presented to the emergency room for shortness of breath and edema in the legs  1. Acute on chronic systolic heart failure exacerbation Admit patient to telemetry.  Being diuresed with IV Lasix 60 mg every 12 hourly.  Has more than 3 L negative fluid balance since yesterday. Continue amiodarone.  Continue Coreg.  No ACE inhibitor due to underlying renal failure. Patient seen by cardiologist.  Appreciate input.  Continue management as outlined.  No plans for any further diagnostic testing at this time.  To begin ambulation.  Physical  therapy consult placed.  Follow-up with cardiologist in 1 week post discharge from hospital. Recently patient had echocardiogram in August 25, 2018 with ejection fraction of 40 to 45%.  2.History of paroxysmal atrial fibrillation Continue amiodarone and Cardizem.  Patient may need electrical cardioversion in the future as outpatient.  3.Type 2 diabetes mellitus Diabetic diet with sliding scale  coverage with insulin along with oral hypoglycemic drugs  4. Emphysema stable Continue home dose inhalers and nebulization treatments  5.Hypertension Continue Coreg for control of blood pressure  6. Acute on chronic kidney disease stage III Nephrology consult placed to monitor renal function with ongoing diuresis with Lasix. Patient claims to have been on torsemide prior to admission and did not appear to be helping with diuresis.  Has been on torsemide for a long time.  May need to be discharged on Lasix on discharge from the hospital. Monitor renal function Avoid nephrotoxic medication   All the records are reviewed and case discussed with Care Management/Social Worker. Management plans discussed with the patient, family and they are in agreement.  CODE STATUS: Full Code  TOTAL TIME TAKING CARE OF THIS PATIENT: 35 minutes.   More than 50% of the time was spent in counseling/coordination of care: YES  POSSIBLE D/C IN 1-2 DAYS, DEPENDING ON CLINICAL CONDITION.   Kmari Halter M.D on 09/13/2018 at 2:32 PM  Between 7am to 6pm - Pager - [phone]  After 6pm go to [email or web address] - Proofreader  [employer] [facility]  Office  [phone]  CC: Primary care physician; [name], MD  Note: This dictation was prepared with Dragon dictation along with smaller phrase technology. Any transcriptional errors that result from this process are unintentional.

## 2018-09-13 NOTE — Progress Notes (Signed)
[facility] Cardiology [facility] Encounter Note  [name] / Admit Date: 09/12/2018 / Date of Encounter: 09/13/2018, 9:09 AM   Subjective: Patient feels much better since admission.  Much less shortness of breath and edema.  Intravenous Lasix helps a lot.  Patient continued on appropriate medication management for risk reduction of heart failure hypertension hyperlipidemia and atrial fibrillation. Awaiting nephrology for further consultation and treatment for appropriate diuretics since the patient did have some issue with heart failure on torsemide. Minimal elevation of troponin most consistent with demand ischemia rather than acute coronary syndrome Review of Systems: Positive for: Shortness of breath Negative for: Vision change, hearing change, syncope, dizziness, nausea, vomiting,diarrhea, bloody stool, stomach pain, cough, congestion, diaphoresis, urinary frequency, urinary pain,skin lesions, skin rashes Others previously listed  Objective: Telemetry: Atrial fibrillation with controlled ventricular rate and preventricular contractions Physical Exam: Blood pressure 124/80, pulse 96, temperature 97.6 F (36.4 C), temperature source Oral, resp. rate 20, height 5\' 9"  (1.753 m), weight (!) 153.1 kg, SpO2 91 %. Body mass index is 49.85 kg/m. General: Well developed, well nourished, in no acute distress. Head: Normocephalic, atraumatic, sclera non-icteric, no xanthomas, nares are without discharge. Neck: No apparent masses Lungs: Normal respirations with no wheezes, no rhonchi, no rales few crackles   Heart irregular rate and rhythm, normal S1 S2, no murmur, no rub, no gallop, PMI is normal size and placement, carotid upstroke normal without bruit, jugular venous pressure normal Abdomen: Soft, non-tender,  distended with normoactive bowel sounds. No hepatosplenomegaly. Abdominal aorta is normal size without bruit Extremities: Trace to 1+ edema, no clubbing, no cyanosis,  no ulcers,  Peripheral: 2+ radial, 2+ femoral, 0 + dorsal pedal pulses Neuro: Alert and oriented. Moves all extremities spontaneously. Psych:  Responds to questions appropriately with a normal affect.   Intake/Output Summary (Last 24 hours) at 09/13/2018 0909 Last data filed at 09/13/2018 0357 Gross per 24 hour  Intake 243 ml  Output 3725 ml  Net -3482 ml    Inpatient Medications:  . acidophilus  1 capsule Oral Daily  . amiodarone  200 mg Oral Daily  . aspirin EC  81 mg Oral Daily  . benzonatate  100 mg Oral BID  . carvedilol  25 mg Oral BID  . cholecalciferol  2,000 Units Oral QHS  . diltiazem  360 mg Oral Daily  . doxazosin  4 mg Oral QHS  . fluticasone  2 spray Each Nare BID  . furosemide  60 mg Intravenous BID  . glipiZIDE  10 mg Oral Daily  . heparin  5,000 Units Subcutaneous Q8H  . insulin aspart  0-20 Units Subcutaneous TID WC  . insulin aspart  0-5 Units Subcutaneous QHS  . loratadine  10 mg Oral Daily  . mometasone-formoterol  2 puff Inhalation BID  . multivitamin with minerals  1 tablet Oral Daily  . potassium chloride  10 mEq Oral Daily  . sodium chloride flush  3 mL Intravenous Q12H  . umeclidinium-vilanterol  1 puff Inhalation Daily   Infusions:  . sodium chloride      Labs: Recent Labs    09/12/18 0914 09/13/18 0240  NA 138 139  K 4.0 3.7  CL 100 101  CO2 30 29  GLUCOSE 218* 167*  BUN 39* 40*  CREATININE 2.55* 2.68*  CALCIUM 8.5* 8.5*   Recent Labs    09/12/18 0914  AST 29  ALT 30  ALKPHOS 74  BILITOT 0.6  PROT 7.2  ALBUMIN 3.6   Recent Labs  09/12/18 0914  WBC 10.1  HGB 10.7*  HCT 33.9*  MCV 96.0  PLT 190   Recent Labs    09/12/18 0914 09/12/18 1504 09/12/18 2021 09/13/18 0240  TROPONINI <0.03 <0.03 <0.03 <0.03   Invalid input(s): POCBNP No results for input(s): HGBA1C in the last 72 hours.   Weights: Filed Weights   09/12/18 4128 09/12/18 1420 09/13/18 0316  Weight: (!) 149.7 kg (!) 154.7 kg (!) 153.1 kg      Radiology/Studies:  Dg Chest Portable 1 View  Result Date: 09/12/2018 CLINICAL DATA:  Shortness of breath, 4+ pitting edema, history CHF, COPD, hypertension, diabetes mellitus, GERD EXAM: PORTABLE CHEST 1 VIEW COMPARISON:  Portable exam 0925 hours compared to 08/24/2018 FINDINGS: Enlargement of cardiac silhouette with pulmonary vascular congestion. Mediastinal contours normal. Chronic eventration of the RIGHT diaphragm with mild RIGHT basilar atelectasis. No acute infiltrate, pleural effusion or pneumothorax. Bones unremarkable. IMPRESSION: Enlargement of cardiac silhouette with pulmonary vascular congestion. Mild RIGHT basilar atelectasis. Electronically Signed   By: Lavonia Dana M.D.   On: 09/12/2018 09:37   Dg Chest Portable 1 View  Result Date: 08/24/2018 CLINICAL DATA:  Chest pain and shortness of breath EXAM: PORTABLE CHEST 1 VIEW COMPARISON:  03/07/2018 FINDINGS: Cardiomegaly and right hemidiaphragm elevation as before. No superimposed acute pneumonia, collapse or consolidation. Negative for edema, effusion or pneumothorax. Trachea is midline. IMPRESSION: Stable cardiomegaly. No interval change or superimposed acute process. Electronically Signed   By: Jerilynn Mages.  Shick M.D.   On: 08/24/2018 10:25     Assessment and Recommendation  68 y.o. male with paroxysmal nonvalvular atrial fibrillation with preventricular contractions essential hypertension mixed hyperlipidemia with acute on chronic systolic dysfunction congestive heart failure secondary to COPD chronic kidney disease atrial fibrillation and anemia now improved with appropriate medication management including intravenous Lasix.evidence of myocardial infarction 1.  Continue Lasix and possible change to either oral torsemide or high dose Lasix orally twice daily depending on recommendations as per nephrology 2.  Continue amiodarone for paroxysmal nonvalvular atrial fibrillation with need for possible electrical cardioversion in the future as  outpatient 3.  Diltiazem for heart rate control of atrial fibrillation 4.  Carvedilol for cardiomyopathy and heart failure with hypertension 5.  Further treatment options of chronic kidney disease anemia and causes likely exacerbating heart failure 6.  No further cardiac diagnostics necessary at this time 7.  Begin ambulation and follow for need for adjustments of medication management and discharge to home today from the cardiac standpoint with follow-up next week with Dr. Clayborn Bigness  Signed, Serafina Royals M.D. FACC

## 2018-09-14 ENCOUNTER — Inpatient Hospital Stay: Payer: Managed Care, Other (non HMO)

## 2018-09-14 LAB — BASIC METABOLIC PANEL
Anion gap: 10 (ref 5–15)
BUN: 41 mg/dL — AB (ref 8–23)
CO2: 29 mmol/L (ref 22–32)
CREATININE: 2.37 mg/dL — AB (ref 0.61–1.24)
Calcium: 9.1 mg/dL (ref 8.9–10.3)
Chloride: 100 mmol/L (ref 98–111)
GFR calc Af Amer: 32 mL/min — ABNORMAL LOW (ref >60)
GFR calc non Af Amer: 28 mL/min — ABNORMAL LOW (ref >60)
Glucose, Bld: 187 mg/dL — ABNORMAL HIGH (ref 70–99)
Potassium: 3.9 mmol/L (ref 3.5–5.1)
Sodium: 139 mmol/L (ref 135–145)

## 2018-09-14 LAB — GLUCOSE, CAPILLARY
GLUCOSE-CAPILLARY: 133 mg/dL — AB (ref 70–99)
Glucose-Capillary: 160 mg/dL — ABNORMAL HIGH (ref 70–99)
Glucose-Capillary: 154 mg/dL — ABNORMAL HIGH (ref 70–99)
Glucose-Capillary: 222 mg/dL — ABNORMAL HIGH (ref 70–99)
Glucose-Capillary: 165 mg/dL — ABNORMAL HIGH (ref 70–99)

## 2018-09-14 LAB — MAGNESIUM: Magnesium: 2.1 mg/dL (ref 1.7–2.4)

## 2018-09-14 MED ORDER — SALINE SPRAY 0.65 % NA SOLN
1.0000 | NASAL | Status: DC | PRN
  Administered 2018-09-14: 1 via NASAL
  Filled 2018-09-14: qty 44

## 2018-09-14 MED ORDER — TRAZODONE HCL 50 MG PO TABS
50.0000 mg | ORAL_TABLET | Freq: Every evening | ORAL | Status: DC | PRN
  Administered 2018-09-14: 50 mg via ORAL
  Filled 2018-09-14: qty 1

## 2018-09-14 NOTE — Plan of Care (Signed)
  Problem: Education: Goal: Ability to demonstrate management of disease process will improve 09/14/2018 0045 by Liliane Channel, RN Outcome: Progressing 09/14/2018 0045 by Liliane Channel, RN Outcome: Progressing Goal: Ability to verbalize understanding of medication therapies will improve 09/14/2018 0045 by Liliane Channel, RN Outcome: Progressing 09/14/2018 0045 by Liliane Channel, RN Outcome: Progressing Goal: Individualized Educational Video(s) Outcome: Progressing   Problem: Education: Goal: Knowledge of General Education information will improve Description Including pain rating scale, medication(s)/side effects and non-pharmacologic comfort measures Outcome: Progressing   Problem: Health Behavior/Discharge Planning: Goal: Ability to manage health-related needs will improve 09/14/2018 0045 by Liliane Channel, RN Outcome: Progressing 09/14/2018 0045 by Liliane Channel, RN Outcome: Progressing   Problem: Clinical Measurements: Goal: Respiratory complications will improve 09/14/2018 0045 by Liliane Channel, RN Outcome: Progressing 09/14/2018 0045 by Liliane Channel, RN Outcome: Progressing

## 2018-09-14 NOTE — Evaluation (Signed)
Physical Therapy Evaluation Patient Details Name: Jason Hudson MRN: [id number] DOB: [birth year] Today's Date: 09/14/2018   History of Present Illness  Pt is a 68 y.o. male with a known history of chronic systolic heart failure with EF of 40 to 45%, COPD, type 2 diabetes mellitus, GERD, hypertension, not on home oxygen presented to emergency room for shortness of breath and low oxygen saturation.  Also had swelling in the lower extremities and has been retaining fluid.  He was put on BiPAP and stabilized and later weaned to oxygen via nasal cannula in the emergency room.  Was given IV Lasix 60 IV for diuresis.  Hospitalist service was consulted for further care.   Assessement includes: Acute on chronic systolic heart failure exacerbation, h/o A-fib, DM II, emphsema, HTN, and acute on chronic kidney disease III.     Clinical Impression  Pt presents with deficits in strength, gait, and activity tolerance and reports noticing a significant decline in how much activity he can tolerate before becoming too SOB to continue over the last several weeks.  Pt was Ind with bed mobility and transfers demonstrating good speed, effort, and control with both.  Pt's baseline SpO2 on room air was 935 with HR 92 bpm and SOB 0/10.  After amb 30' pt's SpO2 remained at 93% with HR 105 bpm and SOB 2/10.  After ascending and descending 6 steps with B rails pt's SpO2 was 92% with HR 107 bpm and 2/10 SOB.  Pt amb his max comfortable distance of 125' with SpO2 90% and HR 112 with moderate SOB.  Discussed [facility] recommendation with SW with pt unlikely to be able to participate secondary to pt stating he can not take of work during the work week.  Pt was agreeable to and will benefit from OPPT services upon discharge to safely address above deficits for decreased risk of further functional decline.       Follow Up Recommendations Outpatient PT, Needs weekend hours    Equipment Recommendations  None recommended by PT     Recommendations for Other Services       Precautions / Restrictions Precautions Precautions: None Restrictions Weight Bearing Restrictions: No      Mobility  Bed Mobility Overal bed mobility: Independent             General bed mobility comments: Good speed and effort with bed mobility tasks  Transfers Overall transfer level: Independent               General transfer comment: Good eccentric and concentric control  Ambulation/Gait Ambulation/Gait assistance: Supervision Gait Distance (Feet): 125 Feet Assistive device: None Gait Pattern/deviations: Step-through pattern Gait velocity: Decreased   General Gait Details: Good stability during amb with SpO2 dropping from 90% after amb 125' with HR increasing from 92 bpm to 109 bpm with mod SOB.   Stairs Stairs: Yes Stairs assistance: Supervision Stair Management: Two rails Number of Stairs: 6 General stair comments: Good eccentric and concentric control ascending and descending stairs with SpO2 92% adn HR 107 bpm with min SOB.  Wheelchair Mobility    Modified Rankin (Stroke Patients Only)       Balance Overall balance assessment: No apparent balance deficits (not formally assessed)                                           Pertinent Vitals/Pain Pain Assessment: No/denies  pain    Home Living Family/patient expects to be discharged to:: Private residence Living Arrangements: Spouse/significant other(Girlfriend) Available Help at Discharge: Family;Available 24 hours/day Type of Home: Mobile home Home Access: Stairs to enter Entrance Stairs-Rails: Right;Left;Can reach both Entrance Stairs-Number of Steps: 4 Home Layout: One level Home Equipment: None      Prior Function Level of Independence: Independent         Comments: Ind amb community distances, no fall history, Ind with ADLs, works FT as a Paediatric nurse        Extremity/Trunk  Assessment   Upper Extremity Assessment Upper Extremity Assessment: Overall WFL for tasks assessed    Lower Extremity Assessment Lower Extremity Assessment: Generalized weakness       Communication   Communication: No difficulties  Cognition Arousal/Alertness: Awake/alert Behavior During Therapy: WFL for tasks assessed/performed Overall Cognitive Status: Within Functional Limits for tasks assessed                                        General Comments      Exercises     Assessment/Plan    PT Assessment Patient needs continued PT services  PT Problem List Decreased strength;Decreased activity tolerance;Cardiopulmonary status limiting activity       PT Treatment Interventions Stair training;Gait training;Therapeutic activities;Therapeutic exercise;Balance training;Patient/family education    PT Goals (Current goals can be found in the Care Plan section)  Acute Rehab PT Goals Patient Stated Goal: Improved endurance PT Goal Formulation: With patient Time For Goal Achievement: 09/27/18 Potential to Achieve Goals: Good    Frequency Min 2X/week   Barriers to discharge        Co-evaluation               AM-PAC PT "6 Clicks" Mobility  Outcome Measure Help needed turning from your back to your side while in a flat bed without using bedrails?: None Help needed moving from lying on your back to sitting on the side of a flat bed without using bedrails?: None Help needed moving to and from a bed to a chair (including a wheelchair)?: None Help needed standing up from a chair using your arms (e.g., wheelchair or bedside chair)?: None Help needed to walk in hospital room?: None Help needed climbing 3-5 steps with a railing? : A Little 6 Click Score: 23    End of Session Equipment Utilized During Treatment: Gait belt Activity Tolerance: Patient tolerated treatment well Patient left: in chair;with call bell/phone within reach Nurse Communication:  Mobility status PT Visit Diagnosis: Muscle weakness (generalized) (M62.81)    Time: 6754-4920 PT Time Calculation (min) (ACUTE ONLY): 23 min   Charges:   PT Evaluation $PT Eval Low Complexity: 1 Low          D. Scott Deryl Giroux PT, DPT 09/14/18, 11:07 AM

## 2018-09-14 NOTE — Progress Notes (Signed)
[facility]  ROUNDING NOTE   Subjective:   UOP liters  Transitioned to PO furosemide.    Objective:  Vital signs in last 24 hours:  Temp:  [97.5 F (36.4 C)-98 F (36.7 C)] 97.6 F (36.4 C) (01/24 0749) Pulse Rate:  [77-100] 92 (01/24 0930) Resp:  [18] 18 (01/23 2041) BP: (100-122)/(60-94) 113/64 (01/24 0749) SpO2:  [93 %-95 %] 93 % (01/24 0930) Weight:  [152.4 kg] 152.4 kg (01/24 0500)  Weight change: 2.676 kg Filed Weights   09/12/18 1420 09/13/18 0316 09/14/18 0500  Weight: (!) 154.7 kg (!) 153.1 kg (!) 152.4 kg    Intake/Output: I/O last 3 completed shifts: In: 63 [P.O.:720] Out: 5750 [Urine:5750]   Intake/Output this shift:  Total I/O In: 240 [P.O.:240] Out: 100 [Urine:100]  Physical Exam: General: NAD, sitting in chair  Head: Normocephalic, atraumatic. Moist oral mucosal membranes  Eyes: Anicteric, PERRL  Neck: Supple, trachea midline  Lungs:  Clear to auscultation  Heart: Regular rate and rhythm  Abdomen:  +abdominal wall edema  Extremities:  ++ peripheral edema.  Neurologic: Nonfocal, moving all four extremities  Skin: No lesions        Basic Metabolic Panel: Recent Labs  Lab 09/12/18 0914 09/13/18 0240 09/14/18 0323  NA 138 139 139  K 4.0 3.7 3.9  CL 100 101 100  CO2 30 29 29   GLUCOSE 218* 167* 187*  BUN 39* 40* 41*  CREATININE 2.55* 2.68* 2.37*  CALCIUM 8.5* 8.5* 9.1  MG  --   --  2.1    Liver Function Tests: Recent Labs  Lab 09/12/18 0914  AST 29  ALT 30  ALKPHOS 74  BILITOT 0.6  PROT 7.2  ALBUMIN 3.6   No results for input(s): LIPASE, AMYLASE in the last 168 hours. No results for input(s): AMMONIA in the last 168 hours.  CBC: Recent Labs  Lab 09/12/18 0914  WBC 10.1  HGB 10.7*  HCT 33.9*  MCV 96.0  PLT 190    Cardiac Enzymes: Recent Labs  Lab 09/12/18 0914 09/12/18 1504 09/12/18 2021 09/13/18 0240  TROPONINI <0.03 <0.03 <0.03 <0.03    BNP: Invalid input(s): POCBNP  CBG: Recent Labs   Lab 09/13/18 1147 09/13/18 1723 09/13/18 2042 09/14/18 0609 09/14/18 0751  GLUCAP 178* 180* 155* 160* 154*    Microbiology: Results for orders placed or performed during the hospital encounter of 08/24/18  MRSA PCR Screening     Status: None   Collection Time: 08/24/18 10:57 PM  Result Value Ref Range Status   MRSA by PCR NEGATIVE NEGATIVE Final    Comment:        The GeneXpert MRSA Assay (FDA approved for NASAL specimens only), is one component of a comprehensive MRSA colonization surveillance program. It is not intended to diagnose MRSA infection nor to guide or monitor treatment for MRSA infections. Performed at [facility], [address] [place], [place] [address]     Coagulation Studies: No results for input(s): LABPROT, INR in the last 72 hours.  Urinalysis: No results for input(s): COLORURINE, LABSPEC, PHURINE, GLUCOSEU, HGBUR, BILIRUBINUR, KETONESUR, PROTEINUR, UROBILINOGEN, NITRITE, LEUKOCYTESUR in the last 72 hours.  Invalid input(s): APPERANCEUR    Imaging: Dg Chest Port 1 View  Result Date: 09/14/2018 CLINICAL DATA:  Short of breath EXAM: PORTABLE CHEST 1 VIEW COMPARISON:  09/12/2018 FINDINGS: Mild pulmonary vascular congestion unchanged. Cardiac enlargement without edema or effusion. Bibasilar atelectasis unchanged. IMPRESSION: Bibasilar atelectasis unchanged. Mild vascular congestion unchanged. Electronically Signed   By: Juanda Crumble  Carlis Abbott M.D.   On: 09/14/2018 07:01     Medications:   . sodium chloride     . acidophilus  1 capsule Oral Daily  . amiodarone  200 mg Oral Daily  . aspirin EC  81 mg Oral Daily  . benzonatate  100 mg Oral BID  . carvedilol  25 mg Oral BID  . cholecalciferol  2,000 Units Oral QHS  . diltiazem  360 mg Oral Daily  . doxazosin  4 mg Oral QHS  . fluticasone  2 spray Each Nare BID  . furosemide  80 mg Oral BID  . glipiZIDE  10 mg Oral Daily  . heparin  5,000 Units Subcutaneous Q8H  . insulin aspart  0-20  Units Subcutaneous TID WC  . insulin aspart  0-5 Units Subcutaneous QHS  . loratadine  10 mg Oral Daily  . mometasone-formoterol  2 puff Inhalation BID  . multivitamin with minerals  1 tablet Oral Daily  . potassium chloride  10 mEq Oral Daily  . sodium chloride flush  3 mL Intravenous Q12H  . umeclidinium-vilanterol  1 puff Inhalation Daily   sodium chloride, acetaminophen, albuterol, nitroGLYCERIN, ondansetron (ZOFRAN) IV, sodium chloride flush  Assessment/ Plan:  Mr. Jason Hudson is a 68 y.o. white male with diabetes mellitus type II, hypertension, GERD, COPD, congestive heart failure , who was admitted to [facility] on 09/12/2018 for acute exacerbation of congestive heart failure  1. Acute renal failure: on chronic kidney disease stage III. Baseline creatinine of 1.39, GFR of 52.  Bland urine Chronic kidney disease secondary to diabetes and hypertension.  Acute renal failure secondary to acute cardiorenal syndrome - transition to PO furosemide, if does not do well, return to IV tonight.   2. Hypertension:  Blood pressure at goal.  Home regimen of torsemide 20mg  daily, carvedilol, doxazosin - carvedilol and diltiazem.   3. Acute exacerbation of systolic congestive heart failure. With atrial fibrillation On Amiodarone for rhythm - PO furosemide as above.   4. COPD, not in acute exacerbation - continue inhalers.   5. Diabetes mellitus type II with chronic kidney disease: hemoglobin A1c of 7.4%. Insulin dependent    LOS: 2 Jason Hudson 1/24/202012:14 PM

## 2018-09-14 NOTE — Progress Notes (Signed)
[facility] [place] NAME: Jason Hudson    MR#:  [id number]  DATE OF BIRTH:  [birth year]  SUBJECTIVE:  CHIEF COMPLAINT:   Chief Complaint  Patient presents with  . Shortness of Breath   No new complaints.  No fevers.  Patient appears to have had some shortness of breath this morning and chest x-ray was done.  Read as unchanged.  Seen by nephrologist this morning.  Patient feeling better at the time of this evaluation.   REVIEW OF SYSTEMS:  Review of Systems  Constitutional: Negative for chills and fever.  HENT: Negative for hearing loss and tinnitus.   Eyes: Negative for blurred vision and double vision.  Respiratory: Positive for shortness of breath. Negative for cough and hemoptysis.   Cardiovascular: Positive for leg swelling. Negative for chest pain and palpitations.  Gastrointestinal: Negative for heartburn, nausea and vomiting.  Genitourinary: Negative for dysuria.  Musculoskeletal: Negative for myalgias and neck pain.  Skin: Negative for itching and rash.  Neurological: Negative for dizziness and headaches.  Psychiatric/Behavioral: Negative for depression and hallucinations.      DRUG ALLERGIES:   Allergies  Allergen Reactions  . Red Dye Anaphylaxis    "not as allergic to it as I used to be. I can take a little of it"   VITALS:  Blood pressure (P) 113/71, pulse 92, temperature 97.6 F (36.4 C), temperature source Oral, resp. rate 18, height 5\' 9"  (1.753 m), weight (!) 152.4 kg, SpO2 (P) 95 %. PHYSICAL EXAMINATION:   Physical Exam  Constitutional: He is oriented to person, place, and time and well-developed, well-nourished, and in no distress.  Morbidly obese  HENT:  Head: Normocephalic and atraumatic.  Eyes: Pupils are equal, round, and reactive to light. Conjunctivae and EOM are normal.  Neck: Normal range of motion. Neck supple. Tracheal deviation present.  Cardiovascular: Normal rate, regular rhythm and normal  heart sounds.  Pulmonary/Chest: Effort normal. He has no wheezes.  Abdominal: Soft. Bowel sounds are normal. There is no abdominal tenderness.  Musculoskeletal: Normal range of motion.        General: Edema present.  Neurological: He is alert and oriented to person, place, and time.  Skin: Skin is warm. He is not diaphoretic. No erythema.  Psychiatric: Affect and judgment normal.   LABORATORY PANEL:  Male CBC Recent Labs  Lab 09/12/18 0914  WBC 10.1  HGB 10.7*  HCT 33.9*  PLT 190   ------------------------------------------------------------------------------------------------------------------ Chemistries  Recent Labs  Lab 09/12/18 0914  09/14/18 0323  NA 138   < > 139  K 4.0   < > 3.9  CL 100   < > 100  CO2 30   < > 29  GLUCOSE 218*   < > 187*  BUN 39*   < > 41*  CREATININE 2.55*   < > 2.37*  CALCIUM 8.5*   < > 9.1  MG  --   --  2.1  AST 29  --   --   ALT 30  --   --   ALKPHOS 74  --   --   BILITOT 0.6  --   --    < > = values in this interval not displayed.   RADIOLOGY:  Dg Chest Port 1 View  Result Date: 09/14/2018 CLINICAL DATA:  Short of breath EXAM: PORTABLE CHEST 1 VIEW COMPARISON:  09/12/2018 FINDINGS: Mild pulmonary vascular congestion unchanged. Cardiac enlargement without edema or effusion. Bibasilar atelectasis unchanged. IMPRESSION:  Bibasilar atelectasis unchanged. Mild vascular congestion unchanged. Electronically Signed   By: Franchot Gallo M.D.   On: 09/14/2018 07:01   ASSESSMENT AND PLAN:   68 year old male patient with history of chronic systolic heart failure with EF of 40 to 45%, type 2 diabetes mellitus, COPD, GERD, hypertension presented to the emergency room for shortness of breath and edema in the legs  1. Acute on chronic systolic heart failure exacerbation Admit patient to telemetry.  Patient was diuresed with IV Lasix 60 mg every 12 hourly.  Seen by nephrologist this morning and transition to p.o. Lasix 80 mg twice daily.  To reevaluate  in the evening and if patient becomes more short of breath to transition back to IV Lasix.  Chest x-ray from this morning read as unchanged.  Patient reports feeling better. Continue amiodarone.  Continue Coreg.  No ACE inhibitor due to underlying renal failure. Patient seen by cardiologist.  Appreciate input.  Continue management as outlined.  No plans for any further diagnostic testing at this time.  To begin ambulation.  Physical therapy consult placed.  Follow-up with cardiologist in 1 week post discharge from hospital. Recently patient had echocardiogram in August 25, 2018 with ejection fraction of 40 to 45%.  2.History of paroxysmal atrial fibrillation Continue amiodarone and Cardizem.  Patient may need electrical cardioversion in the future as outpatient.  3.Type 2 diabetes mellitus Diabetic diet with sliding scale coverage with insulin along with oral hypoglycemic drugs  4. Emphysema stable Continue home dose inhalers and nebulization treatments  5.Hypertension Continue Coreg for control of blood pressure  6. Acute on chronic kidney disease stage III Nephrology consult placed to monitor renal function with ongoing diuresis with Lasix. Patient claims to have been on torsemide prior to admission and did not appear to be helping with diuresis.  Has been on torsemide for a long time.  May need to be discharged on Lasix on discharge from the hospital. Monitor renal function Avoid nephrotoxic medication   All the records are reviewed and case discussed with Care Management/Social Worker. Management plans discussed with the patient, family and they are in agreement.  CODE STATUS: Full Code  TOTAL TIME TAKING CARE OF THIS PATIENT: 33 minutes.   More than 50% of the time was spent in counseling/coordination of care: YES  POSSIBLE D/C IN 1-2 DAYS, DEPENDING ON CLINICAL CONDITION.   Menno Vanbergen M.D on 09/14/2018 at 3:06 PM  Between 7am to 6pm - Pager - [phone]  After 6pm go  to [email or web address] - Proofreader  [facility]  Office  [phone]  CC: Primary care physician; [name], MD  Note: This dictation was prepared with Dragon dictation along with smaller phrase technology. Any transcriptional errors that result from this process are unintentional.

## 2018-09-14 NOTE — Progress Notes (Signed)
Cardiovascular and Pulmonary Nurse Navigator Note:    68 year old male patient with hx of chronic systolic heart failure with EF of 40 - 45%, Type II DM, COPD, GERD, HTN who presented to the ER for SOB and edema in legs.  Patient admitted with dx of acute on chronic systolic HF exacerbation. BNP 220.  Troponin neg x 3.  CXR impression:  Enlargement of Cardiac silhouette with pulmonary vascular congestion.  Mild right basilar atelectasis.   Patient has had 3 admissions this month - 2 to [facility] - 1 to [facility].    Patient was previously hospitalized here at [facility] on 08/24/2018 - 08/27/2018 with acute hypoxic respiratory failure; acute systolic CHF; Atrial fibrillation with RVR; COPD exacerbation; and  AKI on CKD Stage III.  Patient was supposed to follow-up with his PCP in 6 days and his cardiologist in 5 days.  At time of discharge patient had an appointment to see Dr. Clayborn Bigness on 08/30/2018 at 11:30 a.m., which he attended., appt with Dr. Derenda Mis his PCP needed to be scheduled as the office was closed for lunch.  Patient also had an appt scheduled for [facility] on 09/05/2018 at 12 noon.  Patient was a no show for this appointment.    Patient was admitted to [facility] on September 06, 2018 with dx of anasarca, ARF superimposed on CKD, acute on chronic CHF.  Patient was discharged from [facility] on 09/09/2018.   Patient then presented to [facility] ER on 09/12/2018 for this current admission.  Rounded on patient.  Patient feels that what attributed to this admission was he was no longer responding to torsemide.  Patient stated, "I have been on torsemide for years."  Patient also stated he could not get an appointment with his PCP for greater than two weeks post discharge.   Reviewed the 5 steps to Living Better with Heart Failure with patient.  Heart Failure Zones reviewed with patient.    CHF Checklist:   *Intake and output / Daily Weights have been ordered. *Echo done previous admission -  08/25/2018 (EF 40 - 45%) *Patient does not want to be followed in [facility] - not convenient due to his work schedule.  *Dietitian consult for diet education has been completed this admission.  *Patient is aware of fluid restriction of no more than 64 ounces per day.   *Scale has been provided, as he did not purchase scale as planned when he was discharged from the hospital earlier this month.  *Patient is reporting  viewed the CHF educational videos yesterday.   - Patient stated, "I watched about 10 videos."    This RN stressed the importance of MD follow-up, self assessment, and taking medications as prescribe.    With the dx of COPD, OSA, CHF, patient is a candidate for [facility] (outpatient Pulmonary Rehab), but is not interested at this time, as patient is unable to attend due to his work schedule.  Patient works Monday - Friday, 8 to 5 p.m.    Roanna Epley, RN, BSN, [facility]  Cardiovascular & Pulmonary Nurse Navigator  Direct Line: [phone]  Department Phone #: [phone] Fax: [phone]  Email Address: [email or web address]

## 2018-09-14 NOTE — Care Management Note (Signed)
Case Management Note  Patient Details  Name: Jason Hudson MRN: [id number] Date of Birth: Oct 04, 1950  Subjective/Objective:    Patient is willing to go to outpatient PT if he can schedule a later appointment.  Offered choice and he would like to come to [facility] outpatient rehab.  Faxed signed order to [facility] rehab department.  They will contact patient for an appointment.  Patient verbalizes  Understanding.               Action/Plan:   Expected Discharge Date:                  Expected Discharge Plan:  OP Rehab  In-House Referral:     Discharge planning Services  CM Consult  Post Acute Care Choice:    Choice offered to:     DME Arranged:    DME Agency:     HH Arranged:    HH Agency:     Status of Service:  Completed, signed off  If discussed at [employer] of Stay Meetings, dates discussed:    Additional Comments:  Elza Rafter, RN 09/14/2018, 2:55 PM

## 2018-09-14 NOTE — Progress Notes (Signed)
Pt complained of more not feeling good. On assessment V/S stable not and pt is not on any distress. Notify Dr. Marcille Blanco order a STAT chest x-ray. Will continue to monitor.

## 2018-09-15 LAB — BASIC METABOLIC PANEL
Anion gap: 8 (ref 5–15)
BUN: 39 mg/dL — ABNORMAL HIGH (ref 8–23)
CO2: 28 mmol/L (ref 22–32)
Calcium: 8.9 mg/dL (ref 8.9–10.3)
Chloride: 101 mmol/L (ref 98–111)
Creatinine, Ser: 2.4 mg/dL — ABNORMAL HIGH (ref 0.61–1.24)
GFR calc Af Amer: 31 mL/min — ABNORMAL LOW (ref >60)
GFR, EST NON AFRICAN AMERICAN: 27 mL/min — AB (ref >60)
GLUCOSE: 162 mg/dL — AB (ref 70–99)
Potassium: 4.1 mmol/L (ref 3.5–5.1)
Sodium: 137 mmol/L (ref 135–145)

## 2018-09-15 LAB — MAGNESIUM: MAGNESIUM: 2.1 mg/dL (ref 1.7–2.4)

## 2018-09-15 LAB — CBC
HCT: 35.7 % — ABNORMAL LOW (ref 39.0–52.0)
Hemoglobin: 11.1 g/dL — ABNORMAL LOW (ref 13.0–17.0)
MCH: 29.8 pg (ref 26.0–34.0)
MCHC: 31.1 g/dL (ref 30.0–36.0)
MCV: 96 fL (ref 80.0–100.0)
Platelets: 246 10*3/uL (ref 150–400)
RBC: 3.72 MIL/uL — ABNORMAL LOW (ref 4.22–5.81)
RDW: 16.8 % — ABNORMAL HIGH (ref 11.5–15.5)
WBC: 11.9 10*3/uL — AB (ref 4.0–10.5)
nRBC: 0 % (ref 0.0–0.2)

## 2018-09-15 LAB — GLUCOSE, CAPILLARY: Glucose-Capillary: 154 mg/dL — ABNORMAL HIGH (ref 70–99)

## 2018-09-15 LAB — PHOSPHORUS: PHOSPHORUS: 3.7 mg/dL (ref 2.5–4.6)

## 2018-09-15 MED ORDER — FUROSEMIDE 80 MG PO TABS: 80.0000 mg | ORAL_TABLET | Freq: Two times a day (BID) | ORAL | 0 refills | Status: DC

## 2018-09-15 MED ORDER — AMIODARONE HCL 200 MG PO TABS: 200.0000 mg | ORAL_TABLET | Freq: Every day | ORAL | 0 refills | Status: DC

## 2018-09-15 NOTE — Progress Notes (Signed)
[facility]  ROUNDING NOTE   Subjective:   UOP 3.7 liters  PO furosemide   Objective:  Vital signs in last 24 hours:  Temp:  [97.7 F (36.5 C)-98.5 F (36.9 C)] 97.7 F (36.5 C) (01/25 0748) Pulse Rate:  [63-108] 63 (01/25 0748) Resp:  [16-44] 20 (01/25 0748) BP: (99-120)/(68-84) 120/69 (01/25 0748) SpO2:  [93 %-98 %] 93 % (01/25 0748) Weight:  [152.1 kg] 152.1 kg (01/25 0406)  Weight change: -0.272 kg Filed Weights   09/13/18 0316 09/14/18 0500 09/15/18 0406  Weight: (!) 153.1 kg (!) 152.4 kg (!) 152.1 kg    Intake/Output: I/O last 3 completed shifts: In: 71 [P.O.:960] Out: 5575 [Urine:5575]   Intake/Output this shift:  Total I/O In: 240 [P.O.:240] Out: 810 [Urine:810]  Physical Exam: General: NAD, sitting in chair  Head: Normocephalic, atraumatic. Moist oral mucosal membranes  Eyes: Anicteric, PERRL  Neck: Supple, trachea midline  Lungs:  Bilateral basilar crackles  Heart: Regular rate and rhythm  Abdomen:  Soft, obese  Extremities:  + peripheral edema.  Neurologic: Nonfocal, moving all four extremities  Skin: No lesions        Basic Metabolic Panel: Recent Labs  Lab 09/12/18 0914 09/13/18 0240 09/14/18 0323 09/15/18 0423  NA 138 139 139 137  K 4.0 3.7 3.9 4.1  CL 100 101 100 101  CO2 30 29 29 28   GLUCOSE 218* 167* 187* 162*  BUN 39* 40* 41* 39*  CREATININE 2.55* 2.68* 2.37* 2.40*  CALCIUM 8.5* 8.5* 9.1 8.9  MG  --   --  2.1 2.1  PHOS  --   --   --  3.7    Liver Function Tests: Recent Labs  Lab 09/12/18 0914  AST 29  ALT 30  ALKPHOS 74  BILITOT 0.6  PROT 7.2  ALBUMIN 3.6   No results for input(s): LIPASE, AMYLASE in the last 168 hours. No results for input(s): AMMONIA in the last 168 hours.  CBC: Recent Labs  Lab 09/12/18 0914 09/15/18 0423  WBC 10.1 11.9*  HGB 10.7* 11.1*  HCT 33.9* 35.7*  MCV 96.0 96.0  PLT 190 246    Cardiac Enzymes: Recent Labs  Lab 09/12/18 0914 09/12/18 1504 09/12/18 2021  09/13/18 0240  TROPONINI <0.03 <0.03 <0.03 <0.03    BNP: Invalid input(s): POCBNP  CBG: Recent Labs  Lab 09/14/18 0751 09/14/18 1223 09/14/18 1715 09/14/18 2106 09/15/18 0751  GLUCAP 154* 222* 165* 133* 154*    Microbiology: Results for orders placed or performed during the hospital encounter of 08/24/18  MRSA PCR Screening     Status: None   Collection Time: 08/24/18 10:57 PM  Result Value Ref Range Status   MRSA by PCR NEGATIVE NEGATIVE Final    Comment:        The GeneXpert MRSA Assay (FDA approved for NASAL specimens only), is one component of a comprehensive MRSA colonization surveillance program. It is not intended to diagnose MRSA infection nor to guide or monitor treatment for MRSA infections. Performed at [facility], [address] [place], [place] [address]     Coagulation Studies: No results for input(s): LABPROT, INR in the last 72 hours.  Urinalysis: No results for input(s): COLORURINE, LABSPEC, PHURINE, GLUCOSEU, HGBUR, BILIRUBINUR, KETONESUR, PROTEINUR, UROBILINOGEN, NITRITE, LEUKOCYTESUR in the last 72 hours.  Invalid input(s): APPERANCEUR    Imaging: Dg Chest Port 1 View  Result Date: 09/14/2018 CLINICAL DATA:  Short of breath EXAM: PORTABLE CHEST 1 VIEW COMPARISON:  09/12/2018 FINDINGS: Mild  pulmonary vascular congestion unchanged. Cardiac enlargement without edema or effusion. Bibasilar atelectasis unchanged. IMPRESSION: Bibasilar atelectasis unchanged. Mild vascular congestion unchanged. Electronically Signed   By: Franchot Gallo M.D.   On: 09/14/2018 07:01     Medications:   . sodium chloride     . acidophilus  1 capsule Oral Daily  . amiodarone  200 mg Oral Daily  . aspirin EC  81 mg Oral Daily  . benzonatate  100 mg Oral BID  . carvedilol  25 mg Oral BID  . cholecalciferol  2,000 Units Oral QHS  . diltiazem  360 mg Oral Daily  . doxazosin  4 mg Oral QHS  . fluticasone  2 spray Each Nare BID  . furosemide  80 mg  Oral BID  . glipiZIDE  10 mg Oral Daily  . heparin  5,000 Units Subcutaneous Q8H  . insulin aspart  0-20 Units Subcutaneous TID WC  . insulin aspart  0-5 Units Subcutaneous QHS  . loratadine  10 mg Oral Daily  . mometasone-formoterol  2 puff Inhalation BID  . multivitamin with minerals  1 tablet Oral Daily  . potassium chloride  10 mEq Oral Daily  . sodium chloride flush  3 mL Intravenous Q12H  . umeclidinium-vilanterol  1 puff Inhalation Daily   sodium chloride, acetaminophen, albuterol, nitroGLYCERIN, ondansetron (ZOFRAN) IV, sodium chloride, sodium chloride flush, traZODone  Assessment/ Plan:  Mr. Jason Hudson is a 68 y.o. white male with diabetes mellitus type II, hypertension, GERD, COPD, congestive heart failure , who was admitted to [facility] on 09/12/2018 for acute exacerbation of congestive heart failure  1. Acute renal failure: on chronic kidney disease stage III. Baseline creatinine of 1.39, GFR of 52.  Bland urine Chronic kidney disease secondary to diabetes and hypertension.  Acute renal failure secondary to acute cardiorenal syndrome - transitioned to PO furosemide   2. Hypertension:  Blood pressure at goal.  Home regimen of torsemide 20mg  daily, carvedilol, doxazosin - carvedilol and diltiazem.  - PO furosemide on discharge instead of torsemide.   3. Acute exacerbation of systolic congestive heart failure. With atrial fibrillation On Amiodarone for rhythm - PO furosemide as above.   4. COPD, not in acute exacerbation - continue inhalers.   5. Diabetes mellitus type II with chronic kidney disease: hemoglobin A1c of 7.4%. Insulin dependent    LOS: 3 Sylwia Cuervo 1/25/202010:11 AM

## 2018-09-15 NOTE — Progress Notes (Signed)
[place] at [facility] was admitted to the Hospital on 09/12/2018 and Discharged  09/15/2018 and should be excused from work/school for the above days and may return to work/school without any restrictions from 09/17/2018.  Call Saundra Shelling MD with questions.  Saundra Shelling M.D on 09/15/2018,at 1:07 PM  [place] [facility]  [phone]

## 2018-09-15 NOTE — Discharge Instructions (Signed)
Shortness of Breath, Adult  Shortness of breath is when a person has trouble breathing enough air or when a person feels like she or he is having trouble breathing in enough air. Shortness of breath could be a sign of a medical problem.  Follow these instructions at home:     Pay attention to any changes in your symptoms.   Do not use any products that contain nicotine or tobacco, such as cigarettes, e-cigarettes, and chewing tobacco.   Do not smoke. Smoking is a common cause of shortness of breath. If you need help quitting, ask your health care provider.   Avoid things that can irritate your airways, such as:  ? Mold.  ? Dust.  ? Air pollution.  ? Chemical fumes.  ? Things that can cause allergy symptoms (allergens), if you have allergies.   Keep your living space clean and free of mold and dust.   Rest as needed. Slowly return to your usual activities.   Take over-the-counter and prescription medicines only as told by your health care provider. This includes oxygen therapy and inhaled medicines.   Keep all follow-up visits as told by your health care provider. This is important.  Contact a health care provider if:   Your condition does not improve as soon as expected.   You have a hard time doing your normal activities, even after you rest.   You have new symptoms.  Get help right away if:   Your shortness of breath gets worse.   You have shortness of breath when you are resting.   You feel light-headed or you faint.   You have a cough that is not controlled with medicines.   You cough up blood.   You have pain with breathing.   You have pain in your chest, arms, shoulders, or abdomen.   You have a fever.   You cannot walk up stairs or exercise the way that you normally do.  These symptoms may represent a serious problem that is an emergency. Do not wait to see if the symptoms will go away. Get medical help right away. Call your local emergency services (911 in the [place] Do not drive yourself  to the hospital.  Summary   Shortness of breath is when a person has trouble breathing enough air. It can be a sign of a medical problem.   Avoid things that irritate your lungs, such as smoking, pollution, mold, and dust.   Pay attention to changes in your symptoms and contact your health care provider if you have a hard time completing daily activities because of shortness of breath.  This information is not intended to replace advice given to you by your health care provider. Make sure you discuss any questions you have with your health care provider.  Document Released: 05/03/2001 Document Revised: 01/08/2018 Document Reviewed: 01/08/2018  Elsevier Interactive Patient Education  2019 [employer]

## 2018-09-15 NOTE — Discharge Summary (Signed)
[facility] [place] NAME: Jason Hudson    MR#:  [id number]  DATE OF BIRTH:  1951/05/17  DATE OF ADMISSION:  09/12/2018 ADMITTING PHYSICIAN: Saundra Shelling, MD  DATE OF DISCHARGE: 09/15/2018 10:51 AM  PRIMARY CARE PHYSICIAN: [name], MD   ADMISSION DIAGNOSIS:  Dyspnea, unspecified type [R06.00] Congestive heart failure, unspecified HF chronicity, unspecified heart failure type ([place]) [I50.9]  DISCHARGE DIAGNOSIS:  Acute on chronic systolic heart failure exacerbation Acute respiratory distress secondary to heart failure exacerbation Emphysema Type 2 diabetes mellitus GERD Hypertension Chronic kidney disease stage III  SECONDARY DIAGNOSIS:   Past Medical History:  Diagnosis Date  . CHF (congestive heart failure) ([place])   . COPD (chronic obstructive pulmonary disease) ([place])   . Diabetes mellitus without complication ([place])   . Gastroesophageal reflux disease 12/15/2017  . Hypertension   . Melena 11/05/2017  . Pneumonia 07/27/2016     ADMITTING HISTORY Jason Hudson  is a 68 y.o. male with a known history of chronic systolic heart failure with EF of 40 to 45%, COPD, type 2 diabetes mellitus, GERD, hypertension, not on home oxygen presented to emergency room for shortness of breath and low oxygen saturation.  Also had swelling in the lower extremities and has been retaining fluid.  He has been put on BiPAP and stabilized and later on weaned to oxygen via nasal cannula in the emergency room.  Was given IV Lasix 60 IV for diuresis.  Patient follows up with [facility] cardiology as outpatient.  He usually takes oral torsemide for diuresis as outpatient.  No complaints of any chest pain.  Hospitalist service was consulted for further care.  HOSPITAL COURSE:  Patient was initially put on BiPAP for respiratory distress and was weaned to oxygen via nasal cannula.  He was aggressively diuresed with IV Lasix.  Patient was weaned off oxygen  completely.  Patient was seen and evaluated and followed up by nephrology and cardiology during the hospitalization.  Cardiology recommended high-dose oral Lasix to continue for diuresis.  Potassium was supplemented.  Daily body weights and input output chart was monitored.  Shortness of breath improved.  Edema in the legs also resolved.  Patient had recent echocardiogram done on August 25, 2018 which was reviewed.  He will be discharged home on diuretics and advised to follow low-salt diet.  Patient will continue beta-blocker and diltiazem.  CONSULTS OBTAINED:  Treatment Team:  Corey Skains, MD Lavonia Dana, MD  DRUG ALLERGIES:   Allergies  Allergen Reactions  . Red Dye Anaphylaxis    "not as allergic to it as I used to be. I can take a little of it"    DISCHARGE MEDICATIONS:   Allergies as of 09/15/2018      Reactions   Red Dye Anaphylaxis   "not as allergic to it as I used to be. I can take a little of it"      Medication List    STOP taking these medications   amoxicillin-clavulanate 500-125 MG tablet Commonly known as:  AUGMENTIN   pantoprazole 40 MG tablet Commonly known as:  PROTONIX   torsemide 20 MG tablet Commonly known as:  DEMADEX     TAKE these medications   albuterol 108 (90 Base) MCG/ACT inhaler Commonly known as:  PROVENTIL HFA;VENTOLIN HFA Inhale 1-2 puffs into the lungs every 6 (six) hours as needed for wheezing or shortness of breath.   ALKA-SELTZER PLUS COLD PO Take 1 tablet by mouth daily as  needed (congestion).   amiodarone 200 MG tablet Commonly known as:  PACERONE Take 1 tablet (200 mg total) by mouth daily for 30 days. What changed:    medication strength  how much to take  how to take this  when to take this  additional instructions   ANORO ELLIPTA 62.5-25 MCG/INH Aepb Generic drug:  umeclidinium-vilanterol Inhale 1 puff into the lungs daily.   aspirin EC 81 MG tablet Take 1 tablet (81 mg total) by mouth daily.    benzonatate 100 MG capsule Commonly known as:  TESSALON Take 100 mg by mouth 2 (two) times daily.   bifidobacterium infantis capsule Take 1 capsule by mouth daily.   budesonide-formoterol 160-4.5 MCG/ACT inhaler Commonly known as:  SYMBICORT Inhale 2 puffs into the lungs 2 (two) times daily.   carvedilol 25 MG tablet Commonly known as:  COREG Take 1 tablet (25 mg total) by mouth 2 (two) times daily. What changed:  when to take this   diltiazem 360 MG 24 hr capsule Commonly known as:  TIAZAC Take 360 mg by mouth daily.   doxazosin 4 MG tablet Commonly known as:  CARDURA Take 1 tablet (4 mg total) by mouth at bedtime.   fluticasone 50 MCG/ACT nasal spray Commonly known as:  FLONASE Place 2 sprays into both nostrils 2 (two) times daily.   furosemide 80 MG tablet Commonly known as:  LASIX Take 1 tablet (80 mg total) by mouth 2 (two) times daily for 30 days.   glipiZIDE 10 MG 24 hr tablet Commonly known as:  GLUCOTROL XL Take 10 mg by mouth daily.   insulin detemir 100 UNIT/ML injection Commonly known as:  LEVEMIR Inject 0.8 mLs (80 Units total) into the skin daily. What changed:  additional instructions   ipratropium 0.02 % nebulizer solution Commonly known as:  ATROVENT Take 0.5 mg by nebulization every 6 (six) hours as needed for wheezing or shortness of breath.   loratadine 10 MG tablet Commonly known as:  CLARITIN Take 10 mg by mouth daily.   multivitamin Tabs tablet Take 1 tablet by mouth daily.   nitroGLYCERIN 0.4 MG SL tablet Commonly known as:  NITROSTAT Place 1 tablet (0.4 mg total) under the tongue every 5 (five) minutes as needed for chest pain.   potassium chloride 10 MEQ tablet Commonly known as:  K-DUR,KLOR-CON Take 1 tablet (10 mEq total) by mouth daily.   TRULICITY 1.5 QQ/5.9DG Sopn Generic drug:  Dulaglutide Inject 1.5 mg into the skin every Wednesday.   Vitamin D3 50 MCG (2000 UT) Tabs Take 2,000 Units by mouth at bedtime.        Today  Patient seen and evaluated on the day of discharge No shortness of breath Breathing comfortably on room air No chest pain Tolerating diet well Hemodynamically stable and will be discharged home VITAL SIGNS:  Blood pressure 120/69, pulse 63, temperature 97.7 F (36.5 C), temperature source Oral, resp. rate 20, height 5\' 9"  (1.753 m), weight (!) 152.1 kg, SpO2 93 %.  I/O:    Intake/Output Summary (Last 24 hours) at 09/15/2018 1140 Last data filed at 09/15/2018 0900 Gross per 24 hour  Intake 960 ml  Output 3285 ml  Net -2325 ml    PHYSICAL EXAMINATION:  Physical Exam  GENERAL:  68 y.o.-year-old patient lying in the bed with no acute distress.  LUNGS: Normal breath sounds bilaterally, no wheezing, rales,rhonchi or crepitation. No use of accessory muscles of respiration.  CARDIOVASCULAR: S1, S2 normal. No murmurs, rubs, or gallops.  ABDOMEN: Soft, non-tender, non-distended. Bowel sounds present. No organomegaly or mass.  NEUROLOGIC: Moves all 4 extremities. PSYCHIATRIC: The patient is alert and oriented x 3.  SKIN: No obvious rash, lesion, or ulcer.   DATA REVIEW:   CBC Recent Labs  Lab 09/15/18 0423  WBC 11.9*  HGB 11.1*  HCT 35.7*  PLT 246    Chemistries  Recent Labs  Lab 09/12/18 0914  09/15/18 0423  NA 138   < > 137  K 4.0   < > 4.1  CL 100   < > 101  CO2 30   < > 28  GLUCOSE 218*   < > 162*  BUN 39*   < > 39*  CREATININE 2.55*   < > 2.40*  CALCIUM 8.5*   < > 8.9  MG  --    < > 2.1  AST 29  --   --   ALT 30  --   --   ALKPHOS 74  --   --   BILITOT 0.6  --   --    < > = values in this interval not displayed.    Cardiac Enzymes Recent Labs  Lab 09/13/18 0240  TROPONINI <0.03    Microbiology Results  Results for orders placed or performed during the hospital encounter of 08/24/18  MRSA PCR Screening     Status: None   Collection Time: 08/24/18 10:57 PM  Result Value Ref Range Status   MRSA by PCR NEGATIVE NEGATIVE Final    Comment:         The GeneXpert MRSA Assay (FDA approved for NASAL specimens only), is one component of a comprehensive MRSA colonization surveillance program. It is not intended to diagnose MRSA infection nor to guide or monitor treatment for MRSA infections. Performed at [facility], [address] [place], [place] [address]     RADIOLOGY:  Dg Chest Port 1 View  Result Date: 09/14/2018 CLINICAL DATA:  Short of breath EXAM: PORTABLE CHEST 1 VIEW COMPARISON:  09/12/2018 FINDINGS: Mild pulmonary vascular congestion unchanged. Cardiac enlargement without edema or effusion. Bibasilar atelectasis unchanged. IMPRESSION: Bibasilar atelectasis unchanged. Mild vascular congestion unchanged. Electronically Signed   By: Franchot Gallo M.D.   On: 09/14/2018 07:01    Follow up with PCP in 1 week.  Management plans discussed with the patient, family and they are in agreement.  CODE STATUS: Full code    Code Status Orders  (From admission, onward)         Start     Ordered   09/12/18 1420  Full code  Continuous     09/12/18 1419        Code Status History    Date Active Date Inactive Code Status Order ID Comments User Context   08/24/2018 1803 08/27/2018 1701 Full Code [id number]  Saundra Shelling, MD ED   11/06/2017 0006 11/07/2017 1651 Full Code [id number]  Amelia Jo, MD Inpatient   10/20/2017 1214 10/23/2017 1747 Full Code [id number]  Salary, Avel Peace, MD ED   07/27/2016 1515 07/29/2016 1601 Full Code [id number]  Max Sane, MD ED    Advance Directive Documentation     Most Recent Value  Type of Advance Directive  Living will, Healthcare Power of Attorney  Pre-existing out of facility DNR order (yellow form or pink MOST form)  -  "MOST" Form in Place?  -      TOTAL TIME TAKING CARE OF THIS PATIENT ON DAY OF DISCHARGE: more than 35 minutes.  Saundra Shelling M.D on 09/15/2018 at 11:40 AM  Between 7am to 6pm - Pager - [phone]  After 6pm go to [email or web address] - password [email or web address]  [facility]  Office  [phone]  CC: Primary care physician; [name], MD  Note: This dictation was prepared with Dragon dictation along with smaller phrase technology. Any transcriptional errors that result from this process are unintentional.

## 2018-09-15 NOTE — Progress Notes (Signed)
Patient became dyspneic while returning from the bathroom. bipap has been reapplied and patient is resting in bed. Vitals within normal limits.

## 2018-09-19 ENCOUNTER — Ambulatory Visit: Payer: Medicare HMO | Admitting: Family

## 2018-09-25 ENCOUNTER — Emergency Department
Admission: EM | Admit: 2018-09-25 | Discharge: 2018-09-26 | Disposition: A | Discharge status: Home / Self Care | Payer: Managed Care, Other (non HMO) | Attending: Emergency Medicine | Admitting: Emergency Medicine

## 2018-09-25 ENCOUNTER — Encounter: Payer: Self-pay | Admitting: Emergency Medicine

## 2018-09-25 ENCOUNTER — Other Ambulatory Visit: Payer: Self-pay

## 2018-09-25 DIAGNOSIS — Z79899 Other long term (current) drug therapy: Secondary | ICD-10-CM | POA: Diagnosis not present

## 2018-09-25 DIAGNOSIS — Z7982 Long term (current) use of aspirin: Secondary | ICD-10-CM | POA: Insufficient documentation

## 2018-09-25 DIAGNOSIS — J449 Chronic obstructive pulmonary disease, unspecified: Secondary | ICD-10-CM | POA: Insufficient documentation

## 2018-09-25 DIAGNOSIS — R601 Generalized edema: Secondary | ICD-10-CM | POA: Diagnosis not present

## 2018-09-25 DIAGNOSIS — I509 Heart failure, unspecified: Secondary | ICD-10-CM | POA: Diagnosis not present

## 2018-09-25 DIAGNOSIS — N4889 Other specified disorders of penis: Secondary | ICD-10-CM

## 2018-09-25 DIAGNOSIS — N189 Chronic kidney disease, unspecified: Secondary | ICD-10-CM

## 2018-09-25 DIAGNOSIS — I110 Hypertensive heart disease with heart failure: Secondary | ICD-10-CM | POA: Diagnosis not present

## 2018-09-25 DIAGNOSIS — N5089 Other specified disorders of the male genital organs: Secondary | ICD-10-CM

## 2018-09-25 DIAGNOSIS — E119 Type 2 diabetes mellitus without complications: Secondary | ICD-10-CM | POA: Diagnosis not present

## 2018-09-25 LAB — URINALYSIS, COMPLETE (UACMP) WITH MICROSCOPIC
Bacteria, UA: NONE SEEN
Bilirubin Urine: NEGATIVE
Glucose, UA: 150 mg/dL — AB
Ketones, ur: NEGATIVE mg/dL
Nitrite: NEGATIVE
Protein, ur: NEGATIVE mg/dL
SPECIFIC GRAVITY, URINE: 1.012 (ref 1.005–1.030)
pH: 7 (ref 5.0–8.0)

## 2018-09-25 LAB — CBC
HCT: 37.5 % — ABNORMAL LOW (ref 39.0–52.0)
Hemoglobin: 11.6 g/dL — ABNORMAL LOW (ref 13.0–17.0)
MCH: 29.4 pg (ref 26.0–34.0)
MCHC: 30.9 g/dL (ref 30.0–36.0)
MCV: 94.9 fL (ref 80.0–100.0)
Platelets: 285 10*3/uL (ref 150–400)
RBC: 3.95 MIL/uL — ABNORMAL LOW (ref 4.22–5.81)
RDW: 16.7 % — AB (ref 11.5–15.5)
WBC: 12.3 10*3/uL — ABNORMAL HIGH (ref 4.0–10.5)
nRBC: 0 % (ref 0.0–0.2)

## 2018-09-25 LAB — COMPREHENSIVE METABOLIC PANEL
ALT: 22 U/L (ref 0–44)
AST: 28 U/L (ref 15–41)
Albumin: 4 g/dL (ref 3.5–5.0)
Alkaline Phosphatase: 74 U/L (ref 38–126)
Anion gap: 8 (ref 5–15)
BUN: 40 mg/dL — ABNORMAL HIGH (ref 8–23)
CO2: 36 mmol/L — ABNORMAL HIGH (ref 22–32)
Calcium: 9.5 mg/dL (ref 8.9–10.3)
Chloride: 94 mmol/L — ABNORMAL LOW (ref 98–111)
Creatinine, Ser: 2.55 mg/dL — ABNORMAL HIGH (ref 0.61–1.24)
GFR calc Af Amer: 29 mL/min — ABNORMAL LOW (ref >60)
GFR calc non Af Amer: 25 mL/min — ABNORMAL LOW (ref >60)
GLUCOSE: 198 mg/dL — AB (ref 70–99)
Potassium: 3.8 mmol/L (ref 3.5–5.1)
Sodium: 138 mmol/L (ref 135–145)
Total Bilirubin: 0.6 mg/dL (ref 0.3–1.2)
Total Protein: 7.4 g/dL (ref 6.5–8.1)

## 2018-09-25 LAB — LIPASE, BLOOD: Lipase: 24 U/L (ref 11–51)

## 2018-09-25 MED ORDER — METOLAZONE 5 MG PO TABS
5.0000 mg | ORAL_TABLET | ORAL | Status: AC
  Administered 2018-09-26: 5 mg via ORAL
  Filled 2018-09-25: qty 1

## 2018-09-25 MED ORDER — FUROSEMIDE 10 MG/ML IJ SOLN
80.0000 mg | Freq: Once | INTRAMUSCULAR | Status: AC
  Administered 2018-09-26: 80 mg via INTRAVENOUS
  Filled 2018-09-25: qty 8

## 2018-09-25 NOTE — ED Provider Notes (Signed)
[facility] Emergency Department Provider Note  ____________________________________________   First MD Initiated Contact with Patient 09/25/18 2257     (approximate)  I have reviewed the triage vital signs and the nursing notes.   HISTORY  Chief Complaint Abdominal Pain    HPI Jason Hudson is a 68 y.o. male with extensive chronic medical history including CHF, COPD, and morbid obesity.  He presents for evaluation of generalized swelling of his abdomen and genitals.  He reports that his penis and scrotum are "going up inside me".  This is been gradually worsening over about the last week but his gotten more noticeable over the last 24 hours or so.  He has a little bit of pain in his abdomen but mostly he says that it feels tight and swollen.  He denies fever/chills, chest pain, nausea, vomiting, dysuria, diarrhea.  He always has some shortness of breath because of his lungs and his body habitus.  He was admitted to the hospital about 2 weeks ago for CHF exacerbation.  He says that his breathing is much better than it was then, and he is more worried about the swelling.  When he was discharged from the hospital 2 weeks ago he was changed from torsemide which did not seem to be working to furosemide 80 mg twice daily and he says he has been taking his medication.  Nothing in particular makes his symptoms worse and he describes them as moderate.   Past Medical History:  Diagnosis Date  . CHF (congestive heart failure) ([place])   . COPD (chronic obstructive pulmonary disease) ([place])   . Diabetes mellitus without complication ([place])   . Gastroesophageal reflux disease 12/15/2017  . Hypertension   . Melena 11/05/2017  . Pneumonia 07/27/2016    Patient Active Problem List   Diagnosis Date Noted  . Acute exacerbation of CHF (congestive heart failure) ([place]) 09/12/2018  . COPD exacerbation ([place]) 08/24/2018  . Hypertension 01/19/2018  . Chronic obstructive lung  disease ([place]) 12/15/2017  . Gastroesophageal reflux disease 12/15/2017  . Obstructive sleep apnea of adult 12/15/2017  . Melena 11/05/2017  . CHF (congestive heart failure) ([place]) 10/20/2017  . Pneumonia 07/27/2016    Past Surgical History:  Procedure Laterality Date  . COLONOSCOPY WITH PROPOFOL N/A 11/07/2017   Procedure: COLONOSCOPY WITH PROPOFOL;  Surgeon: [name], MD;  Location[facility] ENDOSCOPY;  Service: Gastroenterology;  Laterality: N/A;  . ESOPHAGOGASTRODUODENOSCOPY (EGD) WITH PROPOFOL N/A 11/07/2017   Procedure: ESOPHAGOGASTRODUODENOSCOPY (EGD) WITH PROPOFOL;  Surgeon: [name], MD;  Location: [facility] ENDOSCOPY;  Service: Gastroenterology;  Laterality: N/A;  . LEFT HEART CATH AND CORONARY ANGIOGRAPHY N/A 10/23/2017   Procedure: LEFT HEART CATH AND CORONARY ANGIOGRAPHY;  Surgeon: Isaias Cowman, MD;  Location: [facility] CV LAB;  Service: Cardiovascular;  Laterality: N/A;    Prior to Admission medications   Medication Sig Start Date End Date Taking? Authorizing Provider  albuterol (PROVENTIL HFA;VENTOLIN HFA) 108 (90 Base) MCG/ACT inhaler Inhale 1-2 puffs into the lungs every 6 (six) hours as needed for wheezing or shortness of breath.    [provider]  amiodarone (PACERONE) 200 MG tablet Take 1 tablet (200 mg total) by mouth daily for 30 days. 09/15/18 10/15/18  Saundra Shelling, MD  aspirin EC 81 MG tablet Take 1 tablet (81 mg total) by mouth daily. 07/02/18 07/02/19  Earleen Newport, MD  benzonatate (TESSALON) 100 MG capsule Take 100 mg by mouth 2 (two) times daily.     [provider]  bifidobacterium infantis (ALIGN) capsule Take 1 capsule by mouth daily.    [provider]  budesonide-formoterol (SYMBICORT) 160-4.5 MCG/ACT inhaler Inhale 2 puffs into the lungs 2 (two) times daily.    [provider]  carvedilol (COREG) 25 MG tablet Take 1 tablet (25 mg total) by mouth 2 (two) times daily. Patient taking differently: Take 25 mg by  mouth daily.  08/27/18   Loletha Grayer, MD  Chlorphen-Phenyleph-ASA (ALKA-SELTZER PLUS COLD PO) Take 1 tablet by mouth daily as needed (congestion).     [provider]  Cholecalciferol (VITAMIN D3) 2000 units TABS Take 2,000 Units by mouth at bedtime.    [provider]  diltiazem (TIAZAC) 360 MG 24 hr capsule Take 360 mg by mouth daily.     [provider]  doxazosin (CARDURA) 4 MG tablet Take 1 tablet (4 mg total) by mouth at bedtime. 08/27/18   Loletha Grayer, MD  Dulaglutide (TRULICITY) 1.5 UL/8.4TX SOPN Inject 1.5 mg into the skin every Wednesday. 03/28/18   [provider]  fluticasone (FLONASE) 50 MCG/ACT nasal spray Place 2 sprays into both nostrils 2 (two) times daily.     [provider]  furosemide (LASIX) 80 MG tablet Take 1 tablet (80 mg total) by mouth 2 (two) times daily for 30 days. 09/15/18 10/15/18  Saundra Shelling, MD  glipiZIDE (GLUCOTROL XL) 10 MG 24 hr tablet Take 10 mg by mouth daily.     [provider]  insulin detemir (LEVEMIR) 100 UNIT/ML injection Inject 0.8 mLs (80 Units total) into the skin daily. Patient taking differently: Inject 80 Units into the skin daily. Patient has been increasing dose to 100 units due to high blood sugar for the past couple of days 11/07/17   Loletha Grayer, MD  ipratropium (ATROVENT) 0.02 % nebulizer solution Take 0.5 mg by nebulization every 6 (six) hours as needed for wheezing or shortness of breath.    [provider]  loratadine (CLARITIN) 10 MG tablet Take 10 mg by mouth daily.    [provider]  metolazone (ZAROXOLYN) 5 MG tablet Take 5 mg (1 tablet) by mouth on Mondays, Wednesdays, and Fridays, about 30 minutes before taking your morning dose of furosemide (Lasix). 09/26/18   Hinda Kehr, MD  multivitamin (ONE-A-DAY MEN'S) TABS tablet Take 1 tablet by mouth daily.    [provider]  nitroGLYCERIN (NITROSTAT) 0.4 MG SL tablet Place 1 tablet (0.4 mg total)  under the tongue every 5 (five) minutes as needed for chest pain. 10/23/17   Bettey Costa, MD  potassium chloride (K-DUR,KLOR-CON) 10 MEQ tablet Take 1 tablet (10 mEq total) by mouth daily. 10/23/17   Bettey Costa, MD  umeclidinium-vilanterol (ANORO ELLIPTA) 62.5-25 MCG/INH AEPB Inhale 1 puff into the lungs daily.    [provider]    Allergies Red dye  Family History  Problem Relation Age of Onset  . Cancer Mother   . CVA Father   . Heart disease Father     Social History Social History   Tobacco Use  . Smoking status: Never Smoker  . Smokeless tobacco: Never Used  Substance Use Topics  . Alcohol use: No  . Drug use: Never    Review of Systems Constitutional: No fever/chills.  Swelling throughout his body. Eyes: No visual changes. ENT: No sore throat. Cardiovascular: Denies chest pain. Respiratory: Chronic shortness of breath. Gastrointestinal: Abdominal tightness, swelling, distention.  No nausea, vomiting, diarrhea. Genitourinary: Swelling of his penis and scrotum.  Negative for dysuria. Musculoskeletal: Negative  for neck pain.  Negative for back pain. Integumentary: Negative for rash. Neurological: Negative for headaches, focal weakness or numbness.   ____________________________________________   PHYSICAL EXAM:  VITAL SIGNS: ED Triage Vitals  Enc Vitals Group     BP 09/25/18 2054 113/63     Pulse Rate 09/25/18 2054 77     Resp 09/25/18 2054 20     Temp 09/25/18 2054 (!) 97.5 F (36.4 C)     Temp Source 09/25/18 2054 Oral     SpO2 09/25/18 2054 93 %     Weight 09/25/18 2055 (!) 149.7 kg (330 lb)     Height 09/25/18 2055 1.753 m (5\' 9" )     Head Circumference --      Peak Flow --      Pain Score 09/25/18 2055 3     Pain Loc --      Pain Edu? --      Excl. in [place]? --     Constitutional: Alert and oriented. Well appearing and in no acute distress. Eyes: Conjunctivae are normal.  Head: Atraumatic. Nose: No congestion/rhinnorhea. Mouth/Throat:  Mucous membranes are moist. Neck: No stridor.  No meningeal signs.   Cardiovascular: Normal rate, regular rhythm. Good peripheral circulation. Grossly normal heart sounds. Respiratory: Increased respiratory effort at baseline..  No retractions. Lungs CTAB. Gastrointestinal: Morbid obesity.  Abdomen is tight and swollen but nontender, consistent with anasarca.   Genitourinary: Significant penile edema and scrotal edema but no tenderness to palpation, no evidence of acute infection, no crepitus, no evidence of infection. Musculoskeletal: Chronic bilateral lower extremity edema that the patient states is at baseline. Neurologic:  Normal speech and language. No gross focal neurologic deficits are appreciated.  Skin:  Skin is warm, dry and intact. No rash noted. Psychiatric: Mood and affect are normal. Speech and behavior are normal.  ____________________________________________   LABS (all labs ordered are listed, but only abnormal results are displayed)  Labs Reviewed  COMPREHENSIVE METABOLIC PANEL - Abnormal; Notable for the following components:      Result Value   Chloride 94 (*)    CO2 36 (*)    Glucose, Bld 198 (*)    BUN 40 (*)    Creatinine, Ser 2.55 (*)    GFR calc non Af Amer 25 (*)    GFR calc Af Amer 29 (*)    All other components within normal limits  CBC - Abnormal; Notable for the following components:   WBC 12.3 (*)    RBC 3.95 (*)    Hemoglobin 11.6 (*)    HCT 37.5 (*)    RDW 16.7 (*)    All other components within normal limits  URINALYSIS, COMPLETE (UACMP) WITH MICROSCOPIC - Abnormal; Notable for the following components:   Color, Urine YELLOW (*)    APPearance CLEAR (*)    Glucose, UA 150 (*)    Hgb urine dipstick SMALL (*)    Leukocytes, UA SMALL (*)    All other components within normal limits  BRAIN NATRIURETIC PEPTIDE - Abnormal; Notable for the following components:   B Natriuretic Peptide 228.0 (*)    All other components within normal limits  LIPASE,  BLOOD   ____________________________________________  EKG  None - EKG not ordered by ED physician ____________________________________________  RADIOLOGY   ED MD interpretation: No indication for imaging  Official radiology report(s): No results found.  ____________________________________________   PROCEDURES  Critical Care performed: No   Procedure(s) performed:   Procedures   ____________________________________________  INITIAL IMPRESSION / ASSESSMENT AND PLAN / ED COURSE  As part of my medical decision making, I reviewed the following data within the [facility] notes reviewed and incorporated, Labs reviewed , Old chart reviewed, Discussed with nephrology  and Notes from prior ED visits    Differential diagnosis includes, but is not limited to, CHF exacerbation, anasarca, acute infection, acute on chronic renal failure, medication noncompliance, dietary indiscretions, intra-abdominal process such as ileus or SBO.  The patient has no signs or symptoms of a surgical abdomen and his presentation is consistent with anasarca.  He likely is not getting adequate diuresis from his new Lasix regimen in spite of the fact he is on 80 mg twice daily.  I will discussed the case with nephrology to see if they have any other options for maximizing his diuresis and his medication management.  He does not want to stay in the hospital and I do not think it is necessary given that he is not having any acute shortness of breath over his baseline and if we can find a medication regimen he should be able to follow-up as an outpatient.  He agrees with this plan.  Lab work is notable for a stable creatinine at 2.55 and otherwise unremarkable comprehensive metabolic panel, lipase, urinalysis.  BNP is roughly at his baseline of 228.  Clinical Course as of Sep 26 37  Tue Sep 25, 2018  2354 I spoke by phone with Dr. Holley Raring.  We discussed the case in detail and various  management options.  He recommended giving the patient a dose of metolazone 5 mg by mouth about 30 minutes prior to a dose of furosemide 80 mg IV.  He then recommended discharging him with a prescription for metolazone on Mondays, Wednesdays, and Fridays to be taken about 30 minutes before his dose of Lasix.  He recommended close outpatient follow-up with Dr. Juleen China.    [CF]  Wed Sep 26, 2018  0030 B Natriuretic Peptide(!): 228.0 [CF]    Clinical Course User Index [CF] Hinda Kehr, MD    ____________________________________________  FINAL CLINICAL IMPRESSION(S) / ED DIAGNOSES  Final diagnoses:  Anasarca  Scrotal edema  Penile edema  Acute on chronic congestive heart failure, unspecified heart failure type ([place])  Chronic kidney disease, unspecified CKD stage     MEDICATIONS GIVEN DURING THIS VISIT:  Medications  metolazone (ZAROXOLYN) tablet 5 mg (has no administration in time range)  furosemide (LASIX) injection 80 mg (has no administration in time range)     ED Discharge Orders         Ordered    metolazone (ZAROXOLYN) 5 MG tablet     09/26/18 0011    AMB referral to CHF clinic    Comments:  Patient sees Dr. Juleen [place], having anasarca and penile/scrotal edema in spite of taking furosemide 80 mg PO BID (change from Torsemide about 2 weeks ago).  Starting him on metolazone 5 mg PO qM,W,F before furosemide, but he would benefit from CHF clinic services.   09/26/18 0032           Note:  This document was prepared using Dragon voice recognition software and may include unintentional dictation errors.   Hinda Kehr, MD 09/26/18 [phone]

## 2018-09-25 NOTE — ED Triage Notes (Addendum)
Patient to ER for c/o RLQ abdominal swelling and mild pain. Patient reports h/o hernia repair (many years ago), feels like pain is in similar area. Patient reports pain started approx one month ago, went away and just returned.

## 2018-09-26 ENCOUNTER — Telehealth: Payer: Self-pay

## 2018-09-26 LAB — BRAIN NATRIURETIC PEPTIDE: B Natriuretic Peptide: 228 pg/mL — ABNORMAL HIGH (ref 0.0–100.0)

## 2018-09-26 MED ORDER — METOLAZONE 5 MG PO TABS: ORAL_TABLET | ORAL | 2 refills | Status: DC

## 2018-09-26 NOTE — Telephone Encounter (Signed)
LM for patient to contact office to make appointment after ED visit for Edema. Patient no showed for his initial appointment post hospital discharge on 1/15.

## 2018-09-26 NOTE — Discharge Instructions (Signed)
As we discussed, your symptoms tonight are caused by too much fluid building up in your body leading to generalized swelling (anasarca), currently most notable in your abdomen and genitals.  I spoke with Dr. Holley Raring, a colleague of Dr. Juleen China, and he recommended starting you on the new medication I prescribed, called metolazone (Zaroxolyn).  You should take this about 30 minutes before taking your morning dose of furosemide (Lasix) on Mondays, Wednesdays, and Fridays.  We strongly encourage you to call Dr. Assunta Gambles office to set up the next available appointment, but we also encourage you to make time to see Darylene Price in the heart failure clinic.  We have sent a referral to the clinic and they should be reaching out to you.  Please try to take the time from your work schedule to see them because they can help you manage your heart failure.    Return to the emergency department if you develop new or worsening symptoms that concern you.

## 2018-09-26 NOTE — ED Notes (Signed)
Pt is sitting up in chair. Respirations even/unlabored. Nadn.

## 2018-09-26 NOTE — ED Notes (Signed)
Pt stated that he is swollen all over and he has a lot of swelling in his groin/penis area as well. Pt does have pitting edema noted.

## 2018-10-26 ENCOUNTER — Ambulatory Visit (INDEPENDENT_AMBULATORY_CARE_PROVIDER_SITE_OTHER): Payer: Managed Care, Other (non HMO) | Admitting: Nurse Practitioner

## 2018-10-26 ENCOUNTER — Other Ambulatory Visit: Payer: Self-pay

## 2018-10-26 ENCOUNTER — Encounter: Payer: Self-pay | Admitting: Nurse Practitioner

## 2018-10-26 VITALS — BP 122/94 | HR 49 | Resp 16 | Ht 68.0 in | Wt 312.8 lb

## 2018-10-26 DIAGNOSIS — I5032 Chronic diastolic (congestive) heart failure: Secondary | ICD-10-CM | POA: Diagnosis not present

## 2018-10-26 DIAGNOSIS — Z794 Long term (current) use of insulin: Secondary | ICD-10-CM

## 2018-10-26 DIAGNOSIS — E1122 Type 2 diabetes mellitus with diabetic chronic kidney disease: Secondary | ICD-10-CM | POA: Diagnosis not present

## 2018-10-26 DIAGNOSIS — J449 Chronic obstructive pulmonary disease, unspecified: Secondary | ICD-10-CM | POA: Diagnosis not present

## 2018-10-26 DIAGNOSIS — E11622 Type 2 diabetes mellitus with other skin ulcer: Secondary | ICD-10-CM | POA: Diagnosis not present

## 2018-10-26 DIAGNOSIS — J984 Other disorders of lung: Secondary | ICD-10-CM | POA: Insufficient documentation

## 2018-10-26 DIAGNOSIS — L97209 Non-pressure chronic ulcer of unspecified calf with unspecified severity: Secondary | ICD-10-CM

## 2018-10-26 MED ORDER — MUPIROCIN 2 % EX OINT: TOPICAL_OINTMENT | CUTANEOUS | 1 refills | Status: DC

## 2018-10-26 MED ORDER — INSULIN DETEMIR 100 UNIT/ML FLEXPEN: 80.0000 [IU] | PEN_INJECTOR | Freq: Every day | SUBCUTANEOUS | 11 refills | Status: DC

## 2018-10-26 MED ORDER — SULFAMETHOXAZOLE-TRIMETHOPRIM 800-160 MG PO TABS: 1.0000 | ORAL_TABLET | Freq: Two times a day (BID) | ORAL | 0 refills | Status: DC

## 2018-10-26 MED ORDER — DULAGLUTIDE 1.5 MG/0.5ML ~~LOC~~ SOAJ: 1.5000 mg | SUBCUTANEOUS | 5 refills | Status: DC

## 2018-10-26 NOTE — Progress Notes (Deleted)
New Patient Office Visit  Subjective:  Patient ID: Jason Hudson, male    DOB: Dec 01, 1950  Age: 68 y.o. MRN: [id number]  CC:  Chief Complaint  Patient presents with  . New Patient (Initial Visit)  . Congestive Heart Failure  . Diabetes    Pt has sores on legs and wanting something to put on it,  . Hypertension    HPI [name] presents for ***  Past Medical History:  Diagnosis Date  . CHF (congestive heart failure) ([place])   . COPD (chronic obstructive pulmonary disease) ([place])   . Diabetes mellitus without complication ([place])   . Gastroesophageal reflux disease 12/15/2017  . Hypertension   . Melena 11/05/2017  . Pneumonia 07/27/2016    Past Surgical History:  Procedure Laterality Date  . COLONOSCOPY WITH PROPOFOL N/A 11/07/2017   Procedure: COLONOSCOPY WITH PROPOFOL;  Surgeon: [name], MD;  Location: [facility] ENDOSCOPY;  Service: Gastroenterology;  Laterality: N/A;  . ESOPHAGOGASTRODUODENOSCOPY (EGD) WITH PROPOFOL N/A 11/07/2017   Procedure: ESOPHAGOGASTRODUODENOSCOPY (EGD) WITH PROPOFOL;  Surgeon: [name], MD;  Location: [facility] ENDOSCOPY;  Service: Gastroenterology;  Laterality: N/A;  . LEFT HEART CATH AND CORONARY ANGIOGRAPHY N/A 10/23/2017   Procedure: LEFT HEART CATH AND CORONARY ANGIOGRAPHY;  Surgeon: Isaias Cowman, MD;  Location: [place] CV LAB;  Service: Cardiovascular;  Laterality: N/A;    Family History  Problem Relation Age of Onset  . Cancer Mother   . CVA Father   . Heart disease Father     Social History   Socioeconomic History  . Marital status: Single    Spouse name: Not on file  . Number of children: Not on file  . Years of education: Not on file  . Highest education level: Not on file  Occupational History  . Not on file  Social Needs  . Financial resource strain: Not on file  . Food insecurity:    Worry: Not on file    Inability: Not on file  . Transportation needs:    Medical: Not on file    Non-medical:  Not on file  Tobacco Use  . Smoking status: Never Smoker  . Smokeless tobacco: Never Used  Substance and Sexual Activity  . Alcohol use: No  . Drug use: Never  . Sexual activity: Not on file  Lifestyle  . Physical activity:    Days per week: Not on file    Minutes per session: Not on file  . Stress: Not on file  Relationships  . Social connections:    Talks on phone: Not on file    Gets together: Not on file    Attends religious service: Not on file    Active member of club or organization: Not on file    Attends meetings of clubs or organizations: Not on file    Relationship status: Not on file  . Intimate partner violence:    Fear of current or ex partner: Not on file    Emotionally abused: Not on file    Physically abused: Not on file    Forced sexual activity: Not on file  Other Topics Concern  . Not on file  Social History Narrative  . Not on file    ROS Review of Systems  Objective:   Today's Vitals: BP (!) 122/94 (BP Location: Left Arm, Patient Position: Sitting, Cuff Size: Large)   Pulse (!) 49   Resp 16   Ht 5\' 8"  (1.727 m)   Wt (!) 312 lb 12.8 oz (  141.9 kg)   SpO2 92%   BMI 47.56 kg/m   Physical Exam  Assessment & Plan:   Problem List Items Addressed This Visit    None      Outpatient Encounter Medications as of 10/26/2018  Medication Sig  . albuterol (PROVENTIL HFA;VENTOLIN HFA) 108 (90 Base) MCG/ACT inhaler Inhale 1-2 puffs into the lungs every 6 (six) hours as needed for wheezing or shortness of breath.  Marland Kitchen aspirin EC 81 MG tablet Take 1 tablet (81 mg total) by mouth daily.  . budesonide-formoterol (SYMBICORT) 160-4.5 MCG/ACT inhaler Inhale 2 puffs into the lungs 2 (two) times daily.  . carvedilol (COREG) 25 MG tablet Take 1 tablet (25 mg total) by mouth 2 (two) times daily. (Patient taking differently: Take 25 mg by mouth daily. )  . Chlorphen-Phenyleph-ASA (ALKA-SELTZER PLUS COLD PO) Take 1 tablet by mouth daily as needed (congestion).   .  Cholecalciferol (VITAMIN D3) 2000 units TABS Take 2,000 Units by mouth at bedtime.  Marland Kitchen diltiazem (TIAZAC) 360 MG 24 hr capsule Take 360 mg by mouth daily.   Marland Kitchen doxazosin (CARDURA) 4 MG tablet Take 1 tablet (4 mg total) by mouth at bedtime.  . fluticasone (FLONASE) 50 MCG/ACT nasal spray Place 2 sprays into both nostrils 2 (two) times daily.   Marland Kitchen glipiZIDE (GLUCOTROL XL) 10 MG 24 hr tablet Take 10 mg by mouth daily.   . insulin detemir (LEVEMIR) 100 UNIT/ML injection Inject 0.8 mLs (80 Units total) into the skin daily. (Patient taking differently: Inject 80 Units into the skin daily. Patient has been increasing dose to 100 units due to high blood sugar for the past couple of days)  . ipratropium (ATROVENT) 0.02 % nebulizer solution Take 0.5 mg by nebulization every 6 (six) hours as needed for wheezing or shortness of breath.  . loratadine (CLARITIN) 10 MG tablet Take 10 mg by mouth daily.  . metolazone (ZAROXOLYN) 5 MG tablet Take 5 mg (1 tablet) by mouth on Mondays, Wednesdays, and Fridays, about 30 minutes before taking your morning dose of furosemide (Lasix).  . multivitamin (ONE-A-DAY MEN'S) TABS tablet Take 1 tablet by mouth daily.  . nitroGLYCERIN (NITROSTAT) 0.4 MG SL tablet Place 1 tablet (0.4 mg total) under the tongue every 5 (five) minutes as needed for chest pain.  . potassium chloride (K-DUR,KLOR-CON) 10 MEQ tablet Take 1 tablet (10 mEq total) by mouth daily.  Marland Kitchen umeclidinium-vilanterol (ANORO ELLIPTA) 62.5-25 MCG/INH AEPB Inhale 1 puff into the lungs daily.  Marland Kitchen amiodarone (PACERONE) 200 MG tablet Take 1 tablet (200 mg total) by mouth daily for 30 days.  . benzonatate (TESSALON) 100 MG capsule Take 100 mg by mouth 2 (two) times daily.   . bifidobacterium infantis (ALIGN) capsule Take 1 capsule by mouth daily.  . Dulaglutide (TRULICITY) 1.5 CH/8.5ID SOPN Inject 1.5 mg into the skin every Wednesday.  . furosemide (LASIX) 80 MG tablet Take 1 tablet (80 mg total) by mouth 2 (two) times daily for  30 days.   No facility-administered encounter medications on file as of 10/26/2018.     Follow-up: No follow-ups on file.   Ronnell Freshwater, NP

## 2018-10-26 NOTE — Progress Notes (Signed)
Pulse is low and blood pressure a little elevated. Informed provider  Pt has an FMLA form to be filled out for his job, gave the form to [employer].

## 2018-10-29 ENCOUNTER — Telehealth: Payer: Self-pay | Admitting: Nurse Practitioner

## 2018-10-29 NOTE — Telephone Encounter (Signed)
Spoke with patient and advised him the paperwork he left will need to be dropped back off with original blank, the paperwork already has provider information on it and we can not legally re wright over top of it and we need an original copy for our provider to complete. Beth

## 2018-11-08 ENCOUNTER — Telehealth: Payer: Self-pay

## 2018-11-10 DIAGNOSIS — E11622 Type 2 diabetes mellitus with other skin ulcer: Secondary | ICD-10-CM | POA: Insufficient documentation

## 2018-11-10 DIAGNOSIS — L97209 Non-pressure chronic ulcer of unspecified calf with unspecified severity: Secondary | ICD-10-CM

## 2018-11-10 NOTE — Progress Notes (Signed)
[facility] [place], [place] [address]  Internal MEDICINE  Office Visit Note  Patient Name: Jason Hudson  [id number]  [id number]  Date of Service: 11/10/2018   Complaints/HPI Pt is here for establishment of PCP. Chief Complaint  Patient presents with  . New Patient (Initial Visit)    pt is alos wanting a FMLA form filled out   . Congestive Heart Failure  . Diabetes    Pt has sores on legs and wanting something to put on it,  . Hypertension   The patient is here to establish new primary care physician. He was recently hospitalized several times for exacerbation of congestive heart failure. He regularly sees cardiology. He has been unhappy with the follow up care his primary care provider has provided. The patient is diabetic and blood sugars are running very high. He has sores on the lower extremities, bilaterally. He is not currently on antibiotics for these sores.  He is also in concern about the status of his medical leave paperwork which was initially filed per his prior primary care physician. Medical leave is not being approved because the documentation provided was lacking a lot of infiromation.    Current Medication: Outpatient Encounter Medications as of 10/26/2018  Medication Sig Note  . albuterol (PROVENTIL HFA;VENTOLIN HFA) 108 (90 Base) MCG/ACT inhaler Inhale 1-2 puffs into the lungs every 6 (six) hours as needed for wheezing or shortness of breath.   Marland Kitchen aspirin EC 81 MG tablet Take 1 tablet (81 mg total) by mouth daily.   . budesonide-formoterol (SYMBICORT) 160-4.5 MCG/ACT inhaler Inhale 2 puffs into the lungs 2 (two) times daily.   . carvedilol (COREG) 25 MG tablet Take 1 tablet (25 mg total) by mouth 2 (two) times daily. (Patient taking differently: Take 25 mg by mouth daily. )   . Chlorphen-Phenyleph-ASA (ALKA-SELTZER PLUS COLD PO) Take 1 tablet by mouth daily as needed (congestion).    . Cholecalciferol (VITAMIN D3) 2000 units TABS  Take 2,000 Units by mouth at bedtime.   Marland Kitchen diltiazem (TIAZAC) 360 MG 24 hr capsule Take 360 mg by mouth daily.    Marland Kitchen doxazosin (CARDURA) 4 MG tablet Take 1 tablet (4 mg total) by mouth at bedtime.   . fluticasone (FLONASE) 50 MCG/ACT nasal spray Place 2 sprays into both nostrils 2 (two) times daily.    Marland Kitchen glipiZIDE (GLUCOTROL XL) 10 MG 24 hr tablet Take 10 mg by mouth daily.    Marland Kitchen ipratropium (ATROVENT) 0.02 % nebulizer solution Take 0.5 mg by nebulization every 6 (six) hours as needed for wheezing or shortness of breath.   . loratadine (CLARITIN) 10 MG tablet Take 10 mg by mouth daily. 08/24/2018: Takes liqui-gels at home  . metolazone (ZAROXOLYN) 5 MG tablet Take 5 mg (1 tablet) by mouth on Mondays, Wednesdays, and Fridays, about 30 minutes before taking your morning dose of furosemide (Lasix).   . multivitamin (ONE-A-DAY MEN'S) TABS tablet Take 1 tablet by mouth daily.   . nitroGLYCERIN (NITROSTAT) 0.4 MG SL tablet Place 1 tablet (0.4 mg total) under the tongue every 5 (five) minutes as needed for chest pain.   . potassium chloride (K-DUR,KLOR-CON) 10 MEQ tablet Take 1 tablet (10 mEq total) by mouth daily.   Marland Kitchen umeclidinium-vilanterol (ANORO ELLIPTA) 62.5-25 MCG/INH AEPB Inhale 1 puff into the lungs daily.   . [DISCONTINUED] insulin detemir (LEVEMIR) 100 UNIT/ML injection Inject 0.8 mLs (80 Units total) into the skin daily. (Patient taking differently: Inject 80 Units into the skin  daily. Patient has been increasing dose to 100 units due to high blood sugar for the past couple of days)   . amiodarone (PACERONE) 200 MG tablet Take 1 tablet (200 mg total) by mouth daily for 30 days.   . benzonatate (TESSALON) 100 MG capsule Take 100 mg by mouth 2 (two) times daily.    . bifidobacterium infantis (ALIGN) capsule Take 1 capsule by mouth daily.   . Dulaglutide (TRULICITY) 1.5 RU/0.4VW SOPN Inject 1.5 mg into the skin every Wednesday.   . furosemide (LASIX) 80 MG tablet Take 1 tablet (80 mg total) by mouth 2  (two) times daily for 30 days.   . Insulin Detemir (LEVEMIR FLEXTOUCH) 100 UNIT/ML Pen Inject 80 Units into the skin daily.   . mupirocin ointment (BACTROBAN) 2 % Apply to affected areas twice daily for 14 days then as needed   . sulfamethoxazole-trimethoprim (BACTRIM DS,SEPTRA DS) 800-160 MG tablet Take 1 tablet by mouth 2 (two) times daily.   . [DISCONTINUED] Dulaglutide (TRULICITY) 1.5 UJ/8.1XB SOPN Inject 1.5 mg into the skin every Wednesday. 10/26/2018: stopped per hospital    No facility-administered encounter medications on file as of 10/26/2018.     Surgical History: Past Surgical History:  Procedure Laterality Date  . COLONOSCOPY WITH PROPOFOL N/A 11/07/2017   Procedure: COLONOSCOPY WITH PROPOFOL;  Surgeon: [name], MD;  Location: [facility] ENDOSCOPY;  Service: Gastroenterology;  Laterality: N/A;  . ESOPHAGOGASTRODUODENOSCOPY (EGD) WITH PROPOFOL N/A 11/07/2017   Procedure: ESOPHAGOGASTRODUODENOSCOPY (EGD) WITH PROPOFOL;  Surgeon: [name], MD;  Location: [facility] ENDOSCOPY;  Service: Gastroenterology;  Laterality: N/A;  . LEFT HEART CATH AND CORONARY ANGIOGRAPHY N/A 10/23/2017   Procedure: LEFT HEART CATH AND CORONARY ANGIOGRAPHY;  Surgeon: Isaias Cowman, MD;  Location: [place] CV LAB;  Service: Cardiovascular;  Laterality: N/A;    Medical History: Past Medical History:  Diagnosis Date  . CHF (congestive heart failure) ([place])   . COPD (chronic obstructive pulmonary disease) ([place])   . Diabetes mellitus without complication ([place])   . Gastroesophageal reflux disease 12/15/2017  . Hypertension   . Melena 11/05/2017  . Pneumonia 07/27/2016    Family History: Family History  Problem Relation Age of Onset  . Cancer Mother   . CVA Father   . Heart disease Father     Social History   Socioeconomic History  . Marital status: Single    Spouse name: Not on file  . Number of children: Not on file  . Years of education: Not on file  . Highest education level: Not on file   Occupational History  . Not on file  Social Needs  . Financial resource strain: Not on file  . Food insecurity:    Worry: Not on file    Inability: Not on file  . Transportation needs:    Medical: Not on file    Non-medical: Not on file  Tobacco Use  . Smoking status: Never Smoker  . Smokeless tobacco: Never Used  Substance and Sexual Activity  . Alcohol use: No  . Drug use: Never  . Sexual activity: Not on file  Lifestyle  . Physical activity:    Days per week: Not on file    Minutes per session: Not on file  . Stress: Not on file  Relationships  . Social connections:    Talks on phone: Not on file    Gets together: Not on file    Attends religious service: Not on file    Active member of club or organization:  Not on file    Attends meetings of clubs or organizations: Not on file    Relationship status: Not on file  . Intimate partner violence:    Fear of current or ex partner: Not on file    Emotionally abused: Not on file    Physically abused: Not on file    Forced sexual activity: Not on file  Other Topics Concern  . Not on file  Social History Narrative  . Not on file     Review of Systems  Constitutional: Positive for activity change and fatigue. Negative for chills and unexpected weight change.  HENT: Negative for congestion, postnasal drip, rhinorrhea, sneezing and sore throat.   Respiratory: Positive for chest tightness and shortness of breath. Negative for cough.   Cardiovascular: Positive for leg swelling. Negative for chest pain and palpitations.  Gastrointestinal: Negative for abdominal pain, constipation, diarrhea, nausea and vomiting.  Endocrine: Negative for cold intolerance, heat intolerance, polydipsia and polyuria.       Uncontrolled type 2 diabetes   Musculoskeletal: Negative for arthralgias, back pain, joint swelling and neck pain.  Skin: Positive for wound. Negative for rash.       Multiple lesions on the lower extremities with redness and  swelling present. There is small amount of clear drainage present.   Neurological: Negative for dizziness, tremors, numbness and headaches.  Hematological: Negative for adenopathy. Does not bruise/bleed easily.  Psychiatric/Behavioral: Negative for behavioral problems (Depression), sleep disturbance and suicidal ideas. The patient is nervous/anxious.     Today's Vitals   10/26/18 1142  BP: (!) 122/94  Pulse: (!) 49  Resp: 16  SpO2: 92%  Weight: (!) 312 lb 12.8 oz (141.9 kg)  Height: 5\' 8"  (1.727 m)   Body mass index is 47.56 kg/m.  Physical Exam Vitals signs and nursing note reviewed.  Constitutional:      General: He is not in acute distress.    Appearance: Normal appearance. He is well-developed. He is not diaphoretic.  HENT:     Head: Normocephalic and atraumatic.     Mouth/Throat:     Pharynx: No oropharyngeal exudate.  Eyes:     Pupils: Pupils are equal, round, and reactive to light.  Neck:     Musculoskeletal: Normal range of motion and neck supple.     Thyroid: No thyromegaly.     Vascular: No carotid bruit or JVD.     Trachea: No tracheal deviation.  Cardiovascular:     Rate and Rhythm: Normal rate. Rhythm irregular.     Heart sounds: Murmur present. No friction rub. No gallop.   Pulmonary:     Effort: Pulmonary effort is normal. No respiratory distress.     Breath sounds: Normal breath sounds. No wheezing or rales.  Chest:     Chest wall: No tenderness.  Abdominal:     General: Bowel sounds are normal.     Palpations: Abdomen is soft.     Tenderness: There is no abdominal tenderness.  Musculoskeletal: Normal range of motion.  Lymphadenopathy:     Cervical: No cervical adenopathy.  Skin:    General: Skin is warm and dry.     Comments: There are multiple small, round lesions on bilateral lower extremities. Some have scabs, others are draining a small amount of serosanguinous fluid. There is moderate swelling of the lower legs.   Neurological:     Mental  Status: He is alert and oriented to person, place, and time.     Cranial Nerves: No  cranial nerve deficit.  Psychiatric:        Behavior: Behavior normal.        Thought Content: Thought content normal.        Judgment: Judgment normal.   Assessment/Plan:  1. Type 2 diabetes mellitus with chronic kidney disease, with long-term current use of insulin, unspecified CKD stage (HCC) Increase levemir to 80 units daily. Restart trulicity 1.5mg  weekly. Use sliding scale insulin as needed and as prescribed. Monitor blood sugars closely.  - Insulin Detemir (LEVEMIR FLEXTOUCH) 100 UNIT/ML Pen; Inject 80 Units into the skin daily.  Dispense: 30 pen; Refill: 11 - Dulaglutide (TRULICITY) 1.5 BJ/4.7WG SOPN; Inject 1.5 mg into the skin every Wednesday.  Dispense: 4 pen; Refill: 5  2. Diabetic ulcer of calf (HCC) Start bactrim DS bid for 14 days. Add bactroban ointment twice daily to all affected areas for next two weeks. Keep wounds clean and dry.  - sulfamethoxazole-trimethoprim (BACTRIM DS,SEPTRA DS) 800-160 MG tablet; Take 1 tablet by mouth 2 (two) times daily.  Dispense: 28 tablet; Refill: 0 - mupirocin ointment (BACTROBAN) 2 %; Apply to affected areas twice daily for 14 days then as needed  Dispense: 22 g; Refill: 1  3. Chronic diastolic congestive heart failure ([place]) Continue regular visits with cardiology as scheduled.   4. Chronic obstructive pulmonary disease, unspecified COPD type ([place]) Continue respiratory medications as prescribed.    General Counseling: Malva Cogan understanding of the findings of todays visit and agrees with plan of treatment. I have discussed any further diagnostic evaluation that may be needed or ordered today. We also reviewed his medications today. he has been encouraged to call the office with any questions or concerns that should arise related to todays visit.    Counseling:  Diabetes Counseling:  1. Addition of ACE inh/ ARB'S for nephroprotection.  Microalbumin is updated  2. Diabetic foot care, prevention of complications. Podiatry consult 3. Exercise and lose weight.  4. Diabetic eye examination, Diabetic eye exam is updated  5. Monitor blood sugar closlely. nutrition counseling.  6. Sign and symptoms of hypoglycemia including shaking sweating,confusion and headaches.  This patient was seen by [facility] with Dr Lavera Guise as a part of collaborative care agreement  Meds ordered this encounter  Medications  . Insulin Detemir (LEVEMIR FLEXTOUCH) 100 UNIT/ML Pen    Sig: Inject 80 Units into the skin daily.    Dispense:  30 pen    Refill:  11    90 day prescription.    Order Specific Question:   Supervising Provider    Answer:   Lavera Guise [[id number]]  . Dulaglutide (TRULICITY) 1.5 ZH/0.8MV SOPN    Sig: Inject 1.5 mg into the skin every Wednesday.    Dispense:  4 pen    Refill:  5    Added this back today.    Order Specific Question:   Supervising Provider    Answer:   Lavera Guise [[id number]]  . sulfamethoxazole-trimethoprim (BACTRIM DS,SEPTRA DS) 800-160 MG tablet    Sig: Take 1 tablet by mouth 2 (two) times daily.    Dispense:  28 tablet    Refill:  0    Order Specific Question:   Supervising Provider    Answer:   Lavera Guise [[id number]]  . mupirocin ointment (BACTROBAN) 2 %    Sig: Apply to affected areas twice daily for 14 days then as needed    Dispense:  22 g    Refill:  1  Order Specific Question:   Supervising Provider    Answer:   Lavera Guise [[id number]]    Time spent: 45 Minutes

## 2018-11-13 ENCOUNTER — Ambulatory Visit: Payer: Managed Care, Other (non HMO) | Admitting: Physical Therapy

## 2018-11-14 ENCOUNTER — Other Ambulatory Visit: Payer: Self-pay

## 2018-11-14 DIAGNOSIS — E11622 Type 2 diabetes mellitus with other skin ulcer: Secondary | ICD-10-CM

## 2018-11-14 DIAGNOSIS — L97209 Non-pressure chronic ulcer of unspecified calf with unspecified severity: Principal | ICD-10-CM

## 2018-11-14 MED ORDER — MUPIROCIN 2 % EX OINT: TOPICAL_OINTMENT | CUTANEOUS | 0 refills | Status: DC

## 2018-11-21 ENCOUNTER — Ambulatory Visit: Payer: Managed Care, Other (non HMO) | Admitting: Physical Therapy

## 2018-11-26 ENCOUNTER — Ambulatory Visit: Payer: Managed Care, Other (non HMO) | Admitting: Physical Therapy

## 2018-11-28 ENCOUNTER — Ambulatory Visit: Payer: Managed Care, Other (non HMO) | Admitting: Physical Therapy

## 2018-12-03 ENCOUNTER — Ambulatory Visit: Payer: Managed Care, Other (non HMO) | Admitting: Physical Therapy

## 2018-12-05 ENCOUNTER — Ambulatory Visit: Payer: Managed Care, Other (non HMO) | Admitting: Physical Therapy

## 2018-12-07 ENCOUNTER — Encounter: Payer: Self-pay | Admitting: Nurse Practitioner

## 2018-12-07 ENCOUNTER — Ambulatory Visit (INDEPENDENT_AMBULATORY_CARE_PROVIDER_SITE_OTHER): Payer: Managed Care, Other (non HMO) | Admitting: Nurse Practitioner

## 2018-12-07 ENCOUNTER — Other Ambulatory Visit: Payer: Self-pay

## 2018-12-07 VITALS — BP 100/75 | HR 66 | Resp 16 | Ht 68.0 in | Wt 314.0 lb

## 2018-12-07 DIAGNOSIS — J449 Chronic obstructive pulmonary disease, unspecified: Secondary | ICD-10-CM | POA: Diagnosis not present

## 2018-12-07 DIAGNOSIS — E11622 Type 2 diabetes mellitus with other skin ulcer: Secondary | ICD-10-CM

## 2018-12-07 DIAGNOSIS — E1122 Type 2 diabetes mellitus with diabetic chronic kidney disease: Secondary | ICD-10-CM

## 2018-12-07 DIAGNOSIS — Z794 Long term (current) use of insulin: Secondary | ICD-10-CM

## 2018-12-07 DIAGNOSIS — R600 Localized edema: Secondary | ICD-10-CM | POA: Diagnosis not present

## 2018-12-07 DIAGNOSIS — I5032 Chronic diastolic (congestive) heart failure: Secondary | ICD-10-CM

## 2018-12-07 DIAGNOSIS — L97209 Non-pressure chronic ulcer of unspecified calf with unspecified severity: Secondary | ICD-10-CM

## 2018-12-07 MED ORDER — GLIPIZIDE ER 10 MG PO TB24: 10.0000 mg | ORAL_TABLET | Freq: Every day | ORAL | 5 refills | Status: DC

## 2018-12-07 MED ORDER — BUDESONIDE-FORMOTEROL FUMARATE 160-4.5 MCG/ACT IN AERO: 2.0000 | INHALATION_SPRAY | Freq: Two times a day (BID) | RESPIRATORY_TRACT | 1 refills | Status: DC

## 2018-12-07 MED ORDER — ALBUTEROL SULFATE HFA 108 (90 BASE) MCG/ACT IN AERS: 1.0000 | INHALATION_SPRAY | Freq: Four times a day (QID) | RESPIRATORY_TRACT | 5 refills | Status: DC | PRN

## 2018-12-07 MED ORDER — MUPIROCIN 2 % EX OINT: TOPICAL_OINTMENT | CUTANEOUS | 1 refills | Status: DC

## 2018-12-07 MED ORDER — FUROSEMIDE 80 MG PO TABS: 80.0000 mg | ORAL_TABLET | Freq: Two times a day (BID) | ORAL | 0 refills | Status: DC

## 2018-12-07 MED ORDER — METOLAZONE 5 MG PO TABS: ORAL_TABLET | ORAL | 3 refills | Status: DC

## 2018-12-07 MED ORDER — SULFAMETHOXAZOLE-TRIMETHOPRIM 800-160 MG PO TABS: 1.0000 | ORAL_TABLET | Freq: Two times a day (BID) | ORAL | 1 refills | Status: DC

## 2018-12-07 NOTE — Progress Notes (Signed)
[facility] [place], [place] [address]  Internal MEDICINE  Office Visit Note  Patient Name: Jason Hudson  [id number]  [id number]  Date of Service: 12/08/2018  Chief Complaint  Patient presents with  . Hypertension  . Diabetes  . Quality Metric Gaps     foot exam    The patient is here for follow up visit. Continues to have sores on bilateral lower extremities with swelling. This is much improved from his initial visit. He completed a round of bactrim DS daily and applied mupirocin ointment twice daily. Lesions have healed over with new and healthy skin. There are a few which are scabbed over. There continues to be moderate amount of edema with mild redness. There is no warmth with palpation but there continues to be tenderness. He has brought log with him of blood pressures and blood sugars. Both are generally stable. He continues to see both endocrinology and cardiology, routinely. He does need to have refills of many of his regular medications.       Current Medication: Outpatient Encounter Medications as of 12/07/2018  Medication Sig Note  . albuterol (VENTOLIN HFA) 108 (90 Base) MCG/ACT inhaler Inhale 1-2 puffs into the lungs every 6 (six) hours as needed for wheezing or shortness of breath.   Marland Kitchen aspirin EC 81 MG tablet Take 1 tablet (81 mg total) by mouth daily.   . benzonatate (TESSALON) 100 MG capsule Take 100 mg by mouth 2 (two) times daily.    . bifidobacterium infantis (ALIGN) capsule Take 1 capsule by mouth daily.   . budesonide-formoterol (SYMBICORT) 160-4.5 MCG/ACT inhaler Inhale 2 puffs into the lungs 2 (two) times daily.   . carvedilol (COREG) 25 MG tablet Take 1 tablet (25 mg total) by mouth 2 (two) times daily. (Patient taking differently: Take 25 mg by mouth daily. )   . Chlorphen-Phenyleph-ASA (ALKA-SELTZER PLUS COLD PO) Take 1 tablet by mouth daily as needed (congestion).    . Cholecalciferol (VITAMIN D3) 2000 units TABS Take  2,000 Units by mouth at bedtime.   Marland Kitchen diltiazem (TIAZAC) 360 MG 24 hr capsule Take 360 mg by mouth daily.    Marland Kitchen doxazosin (CARDURA) 4 MG tablet Take 1 tablet (4 mg total) by mouth at bedtime.   . Dulaglutide (TRULICITY) 1.5 YQ/6.5HQ SOPN Inject 1.5 mg into the skin every Wednesday.   . fluticasone (FLONASE) 50 MCG/ACT nasal spray Place 2 sprays into both nostrils 2 (two) times daily.    Marland Kitchen glipiZIDE (GLUCOTROL XL) 10 MG 24 hr tablet Take 1 tablet (10 mg total) by mouth daily.   . Insulin Detemir (LEVEMIR FLEXTOUCH) 100 UNIT/ML Pen Inject 80 Units into the skin daily.   Marland Kitchen ipratropium (ATROVENT) 0.02 % nebulizer solution Take 0.5 mg by nebulization every 6 (six) hours as needed for wheezing or shortness of breath.   . loratadine (CLARITIN) 10 MG tablet Take 10 mg by mouth daily. 08/24/2018: Takes liqui-gels at home  . metolazone (ZAROXOLYN) 5 MG tablet Take 5 mg (1 tablet) by mouth on Mondays, Wednesdays, and Fridays, about 30 minutes before taking your morning dose of furosemide (Lasix).   . multivitamin (ONE-A-DAY MEN'S) TABS tablet Take 1 tablet by mouth daily.   . mupirocin ointment (BACTROBAN) 2 % Apply to affected areas twice daily for 14 days then as needed   . nitroGLYCERIN (NITROSTAT) 0.4 MG SL tablet Place 1 tablet (0.4 mg total) under the tongue every 5 (five) minutes as needed for chest pain.   Marland Kitchen  potassium chloride (K-DUR,KLOR-CON) 10 MEQ tablet Take 1 tablet (10 mEq total) by mouth daily.   Marland Kitchen sulfamethoxazole-trimethoprim (BACTRIM DS) 800-160 MG tablet Take 1 tablet by mouth 2 (two) times daily.   Marland Kitchen umeclidinium-vilanterol (ANORO ELLIPTA) 62.5-25 MCG/INH AEPB Inhale 1 puff into the lungs daily.   . [DISCONTINUED] albuterol (PROVENTIL HFA;VENTOLIN HFA) 108 (90 Base) MCG/ACT inhaler Inhale 1-2 puffs into the lungs every 6 (six) hours as needed for wheezing or shortness of breath.   . [DISCONTINUED] budesonide-formoterol (SYMBICORT) 160-4.5 MCG/ACT inhaler Inhale 2 puffs into the lungs 2 (two)  times daily.   . [DISCONTINUED] glipiZIDE (GLUCOTROL XL) 10 MG 24 hr tablet Take 10 mg by mouth daily.    . [DISCONTINUED] metolazone (ZAROXOLYN) 5 MG tablet Take 5 mg (1 tablet) by mouth on Mondays, Wednesdays, and Fridays, about 30 minutes before taking your morning dose of furosemide (Lasix).   . [DISCONTINUED] mupirocin ointment (BACTROBAN) 2 % Apply to affected areas twice daily for 14 days then as needed   . [DISCONTINUED] sulfamethoxazole-trimethoprim (BACTRIM DS,SEPTRA DS) 800-160 MG tablet Take 1 tablet by mouth 2 (two) times daily.   Marland Kitchen amiodarone (PACERONE) 200 MG tablet Take 1 tablet (200 mg total) by mouth daily for 30 days.   . furosemide (LASIX) 80 MG tablet Take 1 tablet (80 mg total) by mouth 2 (two) times daily for 30 days.   . [DISCONTINUED] furosemide (LASIX) 80 MG tablet Take 1 tablet (80 mg total) by mouth 2 (two) times daily for 30 days.    No facility-administered encounter medications on file as of 12/07/2018.     Surgical History: Past Surgical History:  Procedure Laterality Date  . COLONOSCOPY WITH PROPOFOL N/A 11/07/2017   Procedure: COLONOSCOPY WITH PROPOFOL;  Surgeon: [name], MD;  Location: [facility] ENDOSCOPY;  Service: Gastroenterology;  Laterality: N/A;  . ESOPHAGOGASTRODUODENOSCOPY (EGD) WITH PROPOFOL N/A 11/07/2017   Procedure: ESOPHAGOGASTRODUODENOSCOPY (EGD) WITH PROPOFOL;  Surgeon: [name], MD;  Location: [facility] ENDOSCOPY;  Service: Gastroenterology;  Laterality: N/A;  . LEFT HEART CATH AND CORONARY ANGIOGRAPHY N/A 10/23/2017   Procedure: LEFT HEART CATH AND CORONARY ANGIOGRAPHY;  Surgeon: Isaias Cowman, MD;  Location: [facility] CV LAB;  Service: Cardiovascular;  Laterality: N/A;    Medical History: Past Medical History:  Diagnosis Date  . CHF (congestive heart failure) ([place])   . COPD (chronic obstructive pulmonary disease) ([place])   . Diabetes mellitus without complication ([place])   . Gastroesophageal reflux disease 12/15/2017  . Hypertension    . Melena 11/05/2017  . Pneumonia 07/27/2016    Family History: Family History  Problem Relation Age of Onset  . Cancer Mother   . CVA Father   . Heart disease Father     Social History   Socioeconomic History  . Marital status: Single    Spouse name: Not on file  . Number of children: Not on file  . Years of education: Not on file  . Highest education level: Not on file  Occupational History  . Not on file  Social Needs  . Financial resource strain: Not on file  . Food insecurity:    Worry: Not on file    Inability: Not on file  . Transportation needs:    Medical: Not on file    Non-medical: Not on file  Tobacco Use  . Smoking status: Never Smoker  . Smokeless tobacco: Never Used  Substance and Sexual Activity  . Alcohol use: No  . Drug use: Never  . Sexual activity: Not on file  Lifestyle  . Physical activity:    Days per week: Not on file    Minutes per session: Not on file  . Stress: Not on file  Relationships  . Social connections:    Talks on phone: Not on file    Gets together: Not on file    Attends religious service: Not on file    Active member of club or organization: Not on file    Attends meetings of clubs or organizations: Not on file    Relationship status: Not on file  . Intimate partner violence:    Fear of current or ex partner: Not on file    Emotionally abused: Not on file    Physically abused: Not on file    Forced sexual activity: Not on file  Other Topics Concern  . Not on file  Social History Narrative  . Not on file      Review of Systems  Constitutional: Positive for activity change and fatigue. Negative for chills and unexpected weight change.  HENT: Negative for congestion, postnasal drip, rhinorrhea, sneezing and sore throat.   Respiratory: Positive for chest tightness and shortness of breath. Negative for cough.   Cardiovascular: Positive for leg swelling. Negative for chest pain and palpitations.  Gastrointestinal:  Negative for abdominal pain, constipation, diarrhea, nausea and vomiting.  Endocrine: Negative for cold intolerance, heat intolerance, polydipsia and polyuria.       Uncontrolled type 2 diabetes   Musculoskeletal: Negative for arthralgias, back pain, joint swelling and neck pain.  Skin: Positive for wound. Negative for rash.       Lesions to bilateral lower extremities have improved. There continues to be some redness and edema in both lower extremities.   Neurological: Negative for dizziness, tremors, numbness and headaches.  Hematological: Negative for adenopathy. Does not bruise/bleed easily.  Psychiatric/Behavioral: Negative for behavioral problems (Depression), sleep disturbance and suicidal ideas. The patient is nervous/anxious.     Today's Vitals   12/07/18 1104  BP: 100/75  Pulse: 66  Resp: 16  SpO2: 92%  Weight: (!) 314 lb (142.4 kg)  Height: 5\' 8"  (1.727 m)   Body mass index is 47.74 kg/m.  Physical Exam Vitals signs and nursing note reviewed.  Constitutional:      General: He is not in acute distress.    Appearance: Normal appearance. He is well-developed. He is obese. He is not diaphoretic.  HENT:     Head: Normocephalic and atraumatic.     Mouth/Throat:     Pharynx: No oropharyngeal exudate.  Eyes:     Pupils: Pupils are equal, round, and reactive to light.  Neck:     Musculoskeletal: Normal range of motion and neck supple.     Thyroid: No thyromegaly.     Vascular: No carotid bruit or JVD.     Trachea: No tracheal deviation.  Cardiovascular:     Rate and Rhythm: Normal rate. Rhythm irregular.     Heart sounds: Murmur present. No friction rub. No gallop.   Pulmonary:     Effort: Pulmonary effort is normal. No respiratory distress.     Breath sounds: Normal breath sounds. No wheezing or rales.  Chest:     Chest wall: No tenderness.  Abdominal:     General: Bowel sounds are normal.     Palpations: Abdomen is soft.     Tenderness: There is no abdominal  tenderness.  Musculoskeletal: Normal range of motion.  Lymphadenopathy:     Cervical: No cervical adenopathy.  Skin:  General: Skin is warm and dry.     Comments: There are multiple small, round lesions on bilateral lower extremities. Most have scabs and have new, healthy skin growth. There continues to be redness and swelling in both lower extremities. Both are still tender, however, no warmth can be felt with palpation of the lower legs .  Neurological:     Mental Status: He is alert and oriented to person, place, and time.     Cranial Nerves: No cranial nerve deficit.  Psychiatric:        Behavior: Behavior normal.        Thought Content: Thought content normal.        Judgment: Judgment normal.    Assessment/Plan:  1. Diabetic ulcer of calf ([place]) Repeat round bactrim DS twice daily along with bactroban ointment for next 14 days. Continue to keep the lower legs clean and dry.  - sulfamethoxazole-trimethoprim (BACTRIM DS) 800-160 MG tablet; Take 1 tablet by mouth 2 (two) times daily.  Dispense: 28 tablet; Refill: 1 - mupirocin ointment (BACTROBAN) 2 %; Apply to affected areas twice daily for 14 days then as needed  Dispense: 22 g; Refill: 1  2. Lower extremity edema contiue furosemide and metolazone as prescribed. Refills are provided today. - furosemide (LASIX) 80 MG tablet; Take 1 tablet (80 mg total) by mouth 2 (two) times daily for 30 days.  Dispense: 60 tablet; Refill: 0 - metolazone (ZAROXOLYN) 5 MG tablet; Take 5 mg (1 tablet) by mouth on Mondays, Wednesdays, and Fridays, about 30 minutes before taking your morning dose of furosemide (Lasix).  Dispense: 12 tablet; Refill: 3  3. Type 2 diabetes mellitus with chronic kidney disease, with long-term current use of insulin, unspecified CKD stage (HCC) Continue glipizide as prescribed. Regular visits with endocrinology as scheduled.  - glipiZIDE (GLUCOTROL XL) 10 MG 24 hr tablet; Take 1 tablet (10 mg total) by mouth daily.   Dispense: 30 tablet; Refill: 5  4. Chronic obstructive pulmonary disease, unspecified COPD type ([place]) Continue inhalers and respiratory medications as prescribed. Refills were provided today.  - budesonide-formoterol (SYMBICORT) 160-4.5 MCG/ACT inhaler; Inhale 2 puffs into the lungs 2 (two) times daily.  Dispense: 3 Inhaler; Refill: 1 - metolazone (ZAROXOLYN) 5 MG tablet; Take 5 mg (1 tablet) by mouth on Mondays, Wednesdays, and Fridays, about 30 minutes before taking your morning dose of furosemide (Lasix).  Dispense: 12 tablet; Refill: 3 - albuterol (VENTOLIN HFA) 108 (90 Base) MCG/ACT inhaler; Inhale 1-2 puffs into the lungs every 6 (six) hours as needed for wheezing or shortness of breath.  Dispense: 1 Inhaler; Refill: 5  5. Chronic diastolic congestive heart failure ([place]) Continue regular visits with cardiology as scheduled.  - furosemide (LASIX) 80 MG tablet; Take 1 tablet (80 mg total) by mouth 2 (two) times daily for 30 days.  Dispense: 60 tablet; Refill: 0   General Counseling: Malva Cogan understanding of the findings of todays visit and agrees with plan of treatment. I have discussed any further diagnostic evaluation that may be needed or ordered today. We also reviewed his medications today. he has been encouraged to call the office with any questions or concerns that should arise related to todays visit.  Cardiac risk factor modification:  1. Control blood pressure. 2. Exercise as prescribed. 3. Follow low sodium, low fat diet. and low fat and low cholestrol diet. 4. Take ASA 81mg  once a day. 5. Restricted calories diet to lose weight.  This patient was seen by Leretha Pol FNP Collaboration  with Dr Lavera Guise as a part of collaborative care agreement  Meds ordered this encounter  Medications  . budesonide-formoterol (SYMBICORT) 160-4.5 MCG/ACT inhaler    Sig: Inhale 2 puffs into the lungs 2 (two) times daily.    Dispense:  3 Inhaler    Refill:  1    Order Specific  Question:   Supervising Provider    Answer:   Lavera Guise [[id number]]  . furosemide (LASIX) 80 MG tablet    Sig: Take 1 tablet (80 mg total) by mouth 2 (two) times daily for 30 days.    Dispense:  60 tablet    Refill:  0    Order Specific Question:   Supervising Provider    Answer:   Lavera Guise [[id number]]  . glipiZIDE (GLUCOTROL XL) 10 MG 24 hr tablet    Sig: Take 1 tablet (10 mg total) by mouth daily.    Dispense:  30 tablet    Refill:  5    Order Specific Question:   Supervising Provider    Answer:   Lavera Guise [[id number]]  . metolazone (ZAROXOLYN) 5 MG tablet    Sig: Take 5 mg (1 tablet) by mouth on Mondays, Wednesdays, and Fridays, about 30 minutes before taking your morning dose of furosemide (Lasix).    Dispense:  12 tablet    Refill:  3    Order Specific Question:   Supervising Provider    Answer:   Lavera Guise [[id number]]  . albuterol (VENTOLIN HFA) 108 (90 Base) MCG/ACT inhaler    Sig: Inhale 1-2 puffs into the lungs every 6 (six) hours as needed for wheezing or shortness of breath.    Dispense:  1 Inhaler    Refill:  5    Order Specific Question:   Supervising Provider    Answer:   Lavera Guise [[id number]]  . sulfamethoxazole-trimethoprim (BACTRIM DS) 800-160 MG tablet    Sig: Take 1 tablet by mouth 2 (two) times daily.    Dispense:  28 tablet    Refill:  1    Order Specific Question:   Supervising Provider    Answer:   Lavera Guise [[id number]]  . mupirocin ointment (BACTROBAN) 2 %    Sig: Apply to affected areas twice daily for 14 days then as needed    Dispense:  22 g    Refill:  1    Order Specific Question:   Supervising Provider    Answer:   Lavera Guise [[id number]]    Time spent: 31 Minutes      Dr Lavera Guise Internal medicine

## 2018-12-08 DIAGNOSIS — R600 Localized edema: Secondary | ICD-10-CM | POA: Insufficient documentation

## 2018-12-10 ENCOUNTER — Ambulatory Visit: Payer: Managed Care, Other (non HMO) | Admitting: Physical Therapy

## 2018-12-12 ENCOUNTER — Encounter: Payer: Self-pay | Admitting: Nurse Practitioner

## 2018-12-12 ENCOUNTER — Ambulatory Visit: Payer: Managed Care, Other (non HMO) | Admitting: Physical Therapy

## 2018-12-17 ENCOUNTER — Ambulatory Visit: Payer: Managed Care, Other (non HMO) | Admitting: Physical Therapy

## 2018-12-19 ENCOUNTER — Ambulatory Visit: Payer: Managed Care, Other (non HMO) | Admitting: Physical Therapy

## 2018-12-25 ENCOUNTER — Other Ambulatory Visit: Payer: Self-pay

## 2018-12-25 DIAGNOSIS — L97209 Non-pressure chronic ulcer of unspecified calf with unspecified severity: Principal | ICD-10-CM

## 2018-12-25 DIAGNOSIS — E11622 Type 2 diabetes mellitus with other skin ulcer: Secondary | ICD-10-CM

## 2019-01-10 ENCOUNTER — Other Ambulatory Visit: Payer: Self-pay

## 2019-01-10 DIAGNOSIS — E11622 Type 2 diabetes mellitus with other skin ulcer: Secondary | ICD-10-CM

## 2019-01-10 DIAGNOSIS — L97209 Non-pressure chronic ulcer of unspecified calf with unspecified severity: Secondary | ICD-10-CM

## 2019-01-10 MED ORDER — SULFAMETHOXAZOLE-TRIMETHOPRIM 800-160 MG PO TABS: 1.0000 | ORAL_TABLET | Freq: Two times a day (BID) | ORAL | 1 refills | Status: DC

## 2019-01-23 NOTE — Telephone Encounter (Signed)
Patient picked this up may 2020.Jason Hudson

## 2019-02-04 ENCOUNTER — Telehealth: Payer: Self-pay

## 2019-02-04 ENCOUNTER — Other Ambulatory Visit: Payer: Self-pay | Admitting: Nurse Practitioner

## 2019-02-04 DIAGNOSIS — I152 Hypertension secondary to endocrine disorders: Secondary | ICD-10-CM

## 2019-02-04 DIAGNOSIS — E1159 Type 2 diabetes mellitus with other circulatory complications: Secondary | ICD-10-CM

## 2019-02-04 MED ORDER — DOXAZOSIN MESYLATE 4 MG PO TABS: 4.0000 mg | ORAL_TABLET | Freq: Every day | ORAL | 3 refills | Status: DC

## 2019-02-04 NOTE — Progress Notes (Signed)
Refilled patient's doxazosin 4mg  at bedtime. [facility] .

## 2019-02-04 NOTE — Telephone Encounter (Signed)
Refilled patient's doxazosin 4mg  at bedtime. [facility] to [facility] .

## 2019-02-04 NOTE — Telephone Encounter (Signed)
Pt was notified.  

## 2019-03-01 DIAGNOSIS — H3581 Retinal edema: Secondary | ICD-10-CM | POA: Insufficient documentation

## 2019-03-08 ENCOUNTER — Other Ambulatory Visit: Payer: Self-pay

## 2019-03-08 ENCOUNTER — Encounter: Payer: Self-pay | Admitting: Nurse Practitioner

## 2019-03-08 ENCOUNTER — Ambulatory Visit (INDEPENDENT_AMBULATORY_CARE_PROVIDER_SITE_OTHER): Payer: Managed Care, Other (non HMO) | Admitting: Nurse Practitioner

## 2019-03-08 VITALS — BP 94/66 | HR 69 | Resp 16 | Ht 68.0 in | Wt 320.2 lb

## 2019-03-08 DIAGNOSIS — J449 Chronic obstructive pulmonary disease, unspecified: Secondary | ICD-10-CM | POA: Diagnosis not present

## 2019-03-08 DIAGNOSIS — E11622 Type 2 diabetes mellitus with other skin ulcer: Secondary | ICD-10-CM | POA: Diagnosis not present

## 2019-03-08 DIAGNOSIS — I5032 Chronic diastolic (congestive) heart failure: Secondary | ICD-10-CM

## 2019-03-08 DIAGNOSIS — E1165 Type 2 diabetes mellitus with hyperglycemia: Secondary | ICD-10-CM | POA: Diagnosis not present

## 2019-03-08 DIAGNOSIS — L97209 Non-pressure chronic ulcer of unspecified calf with unspecified severity: Secondary | ICD-10-CM

## 2019-03-08 LAB — POCT GLYCOSYLATED HEMOGLOBIN (HGB A1C): Hemoglobin A1C: 6.2 % — AB (ref 4.0–5.6)

## 2019-03-08 MED ORDER — DOXYCYCLINE HYCLATE 100 MG PO TABS: ORAL_TABLET | ORAL | 0 refills | Status: DC

## 2019-03-08 MED ORDER — BUDESONIDE-FORMOTEROL FUMARATE 160-4.5 MCG/ACT IN AERO: 2.0000 | INHALATION_SPRAY | Freq: Two times a day (BID) | RESPIRATORY_TRACT | 1 refills | Status: DC

## 2019-03-08 NOTE — Progress Notes (Signed)
[facility] [place], [place] [address]  Internal MEDICINE  Office Visit Note  Patient Name: Jason Hudson  [id number]  [id number]  Date of Service: 03/17/2019  Chief Complaint  Patient presents with  . Medical Management of Chronic Issues    3 month follow up  . Diabetes    The patient is here for follow up visit. Continues to have sores on bilateral lower extremities with swelling. This is much improved from his initial visit. He completed a few rounds of bactrim DS daily and applied mupirocin ointment twice daily. Lesions have healed over with new and healthy skin. There are a few which are scabbed over. There continues to be moderate amount of edema with mild redness. There is no warmth with palpation but there continues to be tenderness. He has brought log with him of blood pressures and blood sugars. Both are generally stable. He continues to see both endocrinology and cardiology, routinely. He does need to have refills of many of his regular medications. Today, his HgbA1c is 6.2, which is much improved from the previous checks.       Current Medication: Outpatient Encounter Medications as of 03/08/2019  Medication Sig Note  . albuterol (VENTOLIN HFA) 108 (90 Base) MCG/ACT inhaler Inhale 1-2 puffs into the lungs every 6 (six) hours as needed for wheezing or shortness of breath.   Marland Kitchen aspirin EC 81 MG tablet Take 1 tablet (81 mg total) by mouth daily.   . bifidobacterium infantis (ALIGN) capsule Take 1 capsule by mouth daily.   . budesonide-formoterol (SYMBICORT) 160-4.5 MCG/ACT inhaler Inhale 2 puffs into the lungs 2 (two) times daily.   . carvedilol (COREG) 25 MG tablet Take 1 tablet (25 mg total) by mouth 2 (two) times daily. (Patient taking differently: Take 25 mg by mouth daily. )   . Chlorphen-Phenyleph-ASA (ALKA-SELTZER PLUS COLD PO) Take 1 tablet by mouth daily as needed (congestion).    . Cholecalciferol (VITAMIN D3) 2000 units TABS Take  2,000 Units by mouth at bedtime.   Marland Kitchen diltiazem (TIAZAC) 360 MG 24 hr capsule Take 360 mg by mouth daily.    Marland Kitchen doxazosin (CARDURA) 4 MG tablet Take 1 tablet (4 mg total) by mouth at bedtime.   . Dulaglutide (TRULICITY) 1.5 YY/5.0PT SOPN Inject 1.5 mg into the skin every Wednesday.   . fluticasone (FLONASE) 50 MCG/ACT nasal spray Place 2 sprays into both nostrils 2 (two) times daily.    Marland Kitchen glipiZIDE (GLUCOTROL XL) 10 MG 24 hr tablet Take 1 tablet (10 mg total) by mouth daily.   . Insulin Degludec (TRESIBA FLEXTOUCH) 200 UNIT/ML SOPN Tresiba FlexTouch U-200 insulin 200 unit/mL (3 mL) subcutaneous pen   . ipratropium (ATROVENT) 0.02 % nebulizer solution Take 0.5 mg by nebulization every 6 (six) hours as needed for wheezing or shortness of breath.   . loratadine (CLARITIN) 10 MG tablet Take 10 mg by mouth daily. 08/24/2018: Takes liqui-gels at home  . metolazone (ZAROXOLYN) 5 MG tablet Take 5 mg (1 tablet) by mouth on Mondays, Wednesdays, and Fridays, about 30 minutes before taking your morning dose of furosemide (Lasix).   . multivitamin (ONE-A-DAY MEN'S) TABS tablet Take 1 tablet by mouth daily.   . mupirocin ointment (BACTROBAN) 2 % Apply to affected areas twice daily for 14 days then as needed   . nitroGLYCERIN (NITROSTAT) 0.4 MG SL tablet Place 1 tablet (0.4 mg total) under the tongue every 5 (five) minutes as needed for chest pain.   . potassium  chloride (K-DUR,KLOR-CON) 10 MEQ tablet Take 1 tablet (10 mEq total) by mouth daily.   . [DISCONTINUED] budesonide-formoterol (SYMBICORT) 160-4.5 MCG/ACT inhaler Inhale 2 puffs into the lungs 2 (two) times daily.   . [DISCONTINUED] sulfamethoxazole-trimethoprim (BACTRIM DS) 800-160 MG tablet Take 1 tablet by mouth 2 (two) times daily.   Marland Kitchen amiodarone (PACERONE) 200 MG tablet Take 1 tablet (200 mg total) by mouth daily for 30 days.   . benzonatate (TESSALON) 100 MG capsule Take 100 mg by mouth 2 (two) times daily.    Marland Kitchen doxycycline (VIBRA-TABS) 100 MG tablet  Take 1 tablet po BID for ten days then take 1 tablet daily   . furosemide (LASIX) 80 MG tablet Take 1 tablet (80 mg total) by mouth 2 (two) times daily for 30 days.   . [DISCONTINUED] Insulin Detemir (LEVEMIR FLEXTOUCH) 100 UNIT/ML Pen Inject 80 Units into the skin daily. (Patient not taking: Reported on 03/08/2019)   . [DISCONTINUED] umeclidinium-vilanterol (ANORO ELLIPTA) 62.5-25 MCG/INH AEPB Inhale 1 puff into the lungs daily.    No facility-administered encounter medications on file as of 03/08/2019.     Surgical History: Past Surgical History:  Procedure Laterality Date  . COLONOSCOPY WITH PROPOFOL N/A 11/07/2017   Procedure: COLONOSCOPY WITH PROPOFOL;  Surgeon: [name], MD;  Location: [facility] ENDOSCOPY;  Service: Gastroenterology;  Laterality: N/A;  . ESOPHAGOGASTRODUODENOSCOPY (EGD) WITH PROPOFOL N/A 11/07/2017   Procedure: ESOPHAGOGASTRODUODENOSCOPY (EGD) WITH PROPOFOL;  Surgeon: [name], MD;  Location: [facility] ENDOSCOPY;  Service: Gastroenterology;  Laterality: N/A;  . LEFT HEART CATH AND CORONARY ANGIOGRAPHY N/A 10/23/2017   Procedure: LEFT HEART CATH AND CORONARY ANGIOGRAPHY;  Surgeon: Isaias Cowman, MD;  Location: [facility];  Service: Cardiovascular;  Laterality: N/A;    Medical History: Past Medical History:  Diagnosis Date  . CHF (congestive heart failure) ([place])   . COPD (chronic obstructive pulmonary disease) ([place])   . Diabetes mellitus without complication ([place])   . Gastroesophageal reflux disease 12/15/2017  . Hypertension   . Melena 11/05/2017  . Pneumonia 07/27/2016    Family History: Family History  Problem Relation Age of Onset  . Cancer Mother   . CVA Father   . Heart disease Father     Social History   Socioeconomic History  . Marital status: Single    Spouse name: Not on file  . Number of children: Not on file  . Years of education: Not on file  . Highest education level: Not on file  Occupational History  . Not on file  Social Needs   . Financial resource strain: Not on file  . Food insecurity    Worry: Not on file    Inability: Not on file  . Transportation needs    Medical: Not on file    Non-medical: Not on file  Tobacco Use  . Smoking status: Never Smoker  . Smokeless tobacco: Never Used  Substance and Sexual Activity  . Alcohol use: No  . Drug use: Never  . Sexual activity: Not on file  Lifestyle  . Physical activity    Days per week: Not on file    Minutes per session: Not on file  . Stress: Not on file  Relationships  . Social Herbalist on phone: Not on file    Gets together: Not on file    Attends religious service: Not on file    Active member of club or organization: Not on file    Attends meetings of clubs or organizations:  Not on file    Relationship status: Not on file  . Intimate partner violence    Fear of current or ex partner: Not on file    Emotionally abused: Not on file    Physically abused: Not on file    Forced sexual activity: Not on file  Other Topics Concern  . Not on file  Social History Narrative  . Not on file      Review of Systems  Constitutional: Positive for fatigue. Negative for activity change, chills and unexpected weight change.  HENT: Negative for congestion, postnasal drip, rhinorrhea, sneezing and sore throat.   Respiratory: Positive for shortness of breath. Negative for cough and chest tightness.   Cardiovascular: Positive for leg swelling. Negative for chest pain and palpitations.  Gastrointestinal: Negative for abdominal pain, constipation, diarrhea, nausea and vomiting.  Endocrine: Negative for cold intolerance, heat intolerance, polydipsia and polyuria.       Blood sugars doing well    Musculoskeletal: Negative for arthralgias, back pain, joint swelling and neck pain.  Skin: Positive for wound. Negative for rash.       Lesions to bilateral lower extremities have improved. There continues to be some redness and edema in both lower  extremities.   Neurological: Negative for dizziness, tremors, numbness and headaches.  Hematological: Negative for adenopathy. Does not bruise/bleed easily.  Psychiatric/Behavioral: Negative for behavioral problems (Depression), sleep disturbance and suicidal ideas. The patient is nervous/anxious.    Today's Vitals   03/08/19 1151  BP: 94/66  Pulse: 69  Resp: 16  SpO2: 94%  Weight: (!) 320 lb 3.2 oz (145.2 kg)  Height: 5\' 8"  (1.727 m)   Body mass index is 48.69 kg/m.  Physical Exam Vitals signs and nursing note reviewed.  Constitutional:      General: He is not in acute distress.    Appearance: Normal appearance. He is well-developed. He is obese. He is not diaphoretic.  HENT:     Head: Normocephalic and atraumatic.     Mouth/Throat:     Pharynx: No oropharyngeal exudate.  Eyes:     Pupils: Pupils are equal, round, and reactive to light.  Neck:     Musculoskeletal: Normal range of motion and neck supple.     Thyroid: No thyromegaly.     Vascular: No carotid bruit or JVD.     Trachea: No tracheal deviation.  Cardiovascular:     Rate and Rhythm: Normal rate. Rhythm irregular.     Heart sounds: Murmur present. No friction rub. No gallop.   Pulmonary:     Effort: Pulmonary effort is normal. No respiratory distress.     Breath sounds: Normal breath sounds. No wheezing or rales.  Chest:     Chest wall: No tenderness.  Abdominal:     General: Bowel sounds are normal.     Palpations: Abdomen is soft.     Tenderness: There is no abdominal tenderness.  Musculoskeletal: Normal range of motion.  Lymphadenopathy:     Cervical: No cervical adenopathy.  Skin:    General: Skin is warm and dry.     Comments: There are multiple small, round lesions on bilateral lower extremities. Most have scabs and have new, healthy skin growth. There continues to be redness and swelling in both lower extremities. Both are still tender, however, no warmth can be felt with palpation of the lower legs/  appearance of skin on both lower legs has improved significantly   Neurological:     Mental Status: He is  alert and oriented to person, place, and time.     Cranial Nerves: No cranial nerve deficit.  Psychiatric:        Behavior: Behavior normal.        Thought Content: Thought content normal.        Judgment: Judgment normal.   Assessment/Plan: 1. Uncontrolled type 2 diabetes mellitus with hyperglycemia (HCC) - POCT HgB A1C 6.2 today. He should continue diabetic medications as prescribed. Regular visits with endocrinology as scheduled.   2. Diabetic ulcer of calf (HCC) Start doxycycline 100mg  twice daily for 10 days then take 1 tablet daily to prevent further infection.  - doxycycline (VIBRA-TABS) 100 MG tablet; Take 1 tablet po BID for ten days then take 1 tablet daily  Dispense: 20 tablet; Refill: 0  3. Chronic obstructive pulmonary disease, unspecified COPD type ([place]) Continue inhalers and respiratory medications as prescribed  - budesonide-formoterol (SYMBICORT) 160-4.5 MCG/ACT inhaler; Inhale 2 puffs into the lungs 2 (two) times daily.  Dispense: 3 Inhaler; Refill: 1  4. Chronic diastolic congestive heart failure ([place]) Regular visits with cardiology as scheduled  General Counseling: Malva Cogan understanding of the findings of todays visit and agrees with plan of treatment. I have discussed any further diagnostic evaluation that may be needed or ordered today. We also reviewed his medications today. he has been encouraged to call the office with any questions or concerns that should arise related to todays visit.  Diabetes Counseling:  1. Addition of ACE inh/ ARB'S for nephroprotection. Microalbumin is updated  2. Diabetic foot care, prevention of complications. Podiatry consult 3. Exercise and lose weight.  4. Diabetic eye examination, Diabetic eye exam is updated  5. Monitor blood sugar closlely. nutrition counseling.  6. Sign and symptoms of hypoglycemia including  shaking sweating,confusion and headaches.   This patient was seen by Leretha Pol FNP Collaboration with Dr Lavera Guise as a part of collaborative care agreement  Orders Placed This Encounter  Procedures  . POCT HgB A1C    Meds ordered this encounter  Medications  . doxycycline (VIBRA-TABS) 100 MG tablet    Sig: Take 1 tablet po BID for ten days then take 1 tablet daily    Dispense:  20 tablet    Refill:  0    Order Specific Question:   Supervising Provider    Answer:   Lavera Guise [place]  . budesonide-formoterol (SYMBICORT) 160-4.5 MCG/ACT inhaler    Sig: Inhale 2 puffs into the lungs 2 (two) times daily.    Dispense:  3 Inhaler    Refill:  1    Please send prior authorization paperwork please    Order Specific Question:   Supervising Provider    Answer:   Lavera Guise [[id number]]    Time spent: 25 Minutes      Dr Lavera Guise Internal medicine

## 2019-03-17 DIAGNOSIS — E1165 Type 2 diabetes mellitus with hyperglycemia: Secondary | ICD-10-CM | POA: Insufficient documentation

## 2019-03-26 ENCOUNTER — Encounter: Payer: Self-pay | Admitting: Nurse Practitioner

## 2019-03-26 IMAGING — DX DG CHEST 1V PORT
1 series · 1 of 1 positions shown · non-contrast
Comparison: 07/27/2016 chest radiograph

CLINICAL DATA: 67 y/o M; 1 week of shortness of breath. Diagnosis
with flu last week. History of COPD and asthma.

EXAM:
PORTABLE CHEST 1 VIEW

[chest ap]
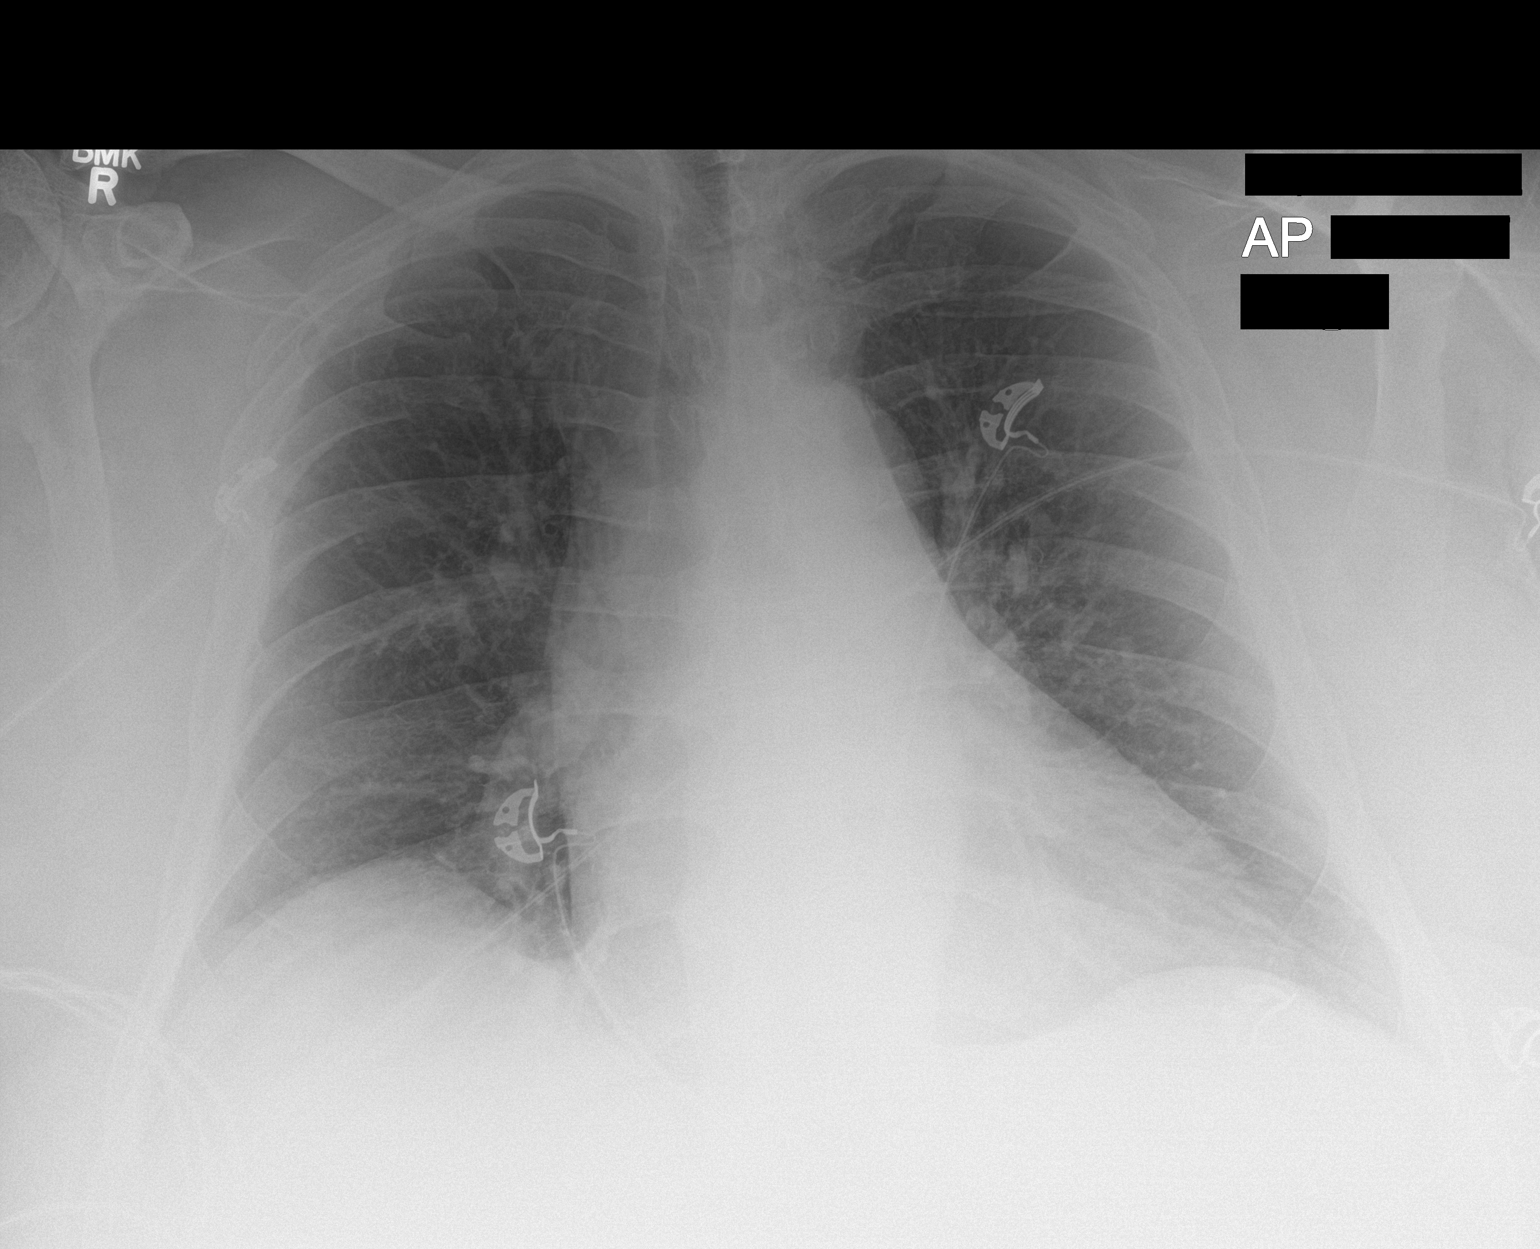

[1 of 1 positions shown; findings below may reference images not displayed]

FINDINGS: The heart size and mediastinal contours are within normal limits.
Perihilar bronchitic changes. No consolidation. No pleural effusion
or pneumothorax. The visualized skeletal structures are
unremarkable.
IMPRESSION: Perihilar bronchitic changes.  No focal consolidation.

By: Emillyo Boutia M.D.

## 2019-04-01 ENCOUNTER — Other Ambulatory Visit: Payer: Self-pay | Admitting: Nurse Practitioner

## 2019-04-01 DIAGNOSIS — E11622 Type 2 diabetes mellitus with other skin ulcer: Secondary | ICD-10-CM

## 2019-04-01 MED ORDER — DOXYCYCLINE HYCLATE 100 MG PO TABS: ORAL_TABLET | ORAL | 3 refills | Status: DC

## 2019-05-03 ENCOUNTER — Other Ambulatory Visit: Payer: Self-pay

## 2019-05-03 DIAGNOSIS — E1122 Type 2 diabetes mellitus with diabetic chronic kidney disease: Secondary | ICD-10-CM

## 2019-05-03 MED ORDER — GLIPIZIDE ER 10 MG PO TB24: 10.0000 mg | ORAL_TABLET | Freq: Every day | ORAL | 5 refills | Status: DC

## 2019-05-07 ENCOUNTER — Other Ambulatory Visit: Payer: Self-pay

## 2019-05-07 DIAGNOSIS — E1159 Type 2 diabetes mellitus with other circulatory complications: Secondary | ICD-10-CM

## 2019-05-07 DIAGNOSIS — I152 Hypertension secondary to endocrine disorders: Secondary | ICD-10-CM

## 2019-05-07 MED ORDER — POTASSIUM CHLORIDE CRYS ER 10 MEQ PO TBCR: 10.0000 meq | EXTENDED_RELEASE_TABLET | Freq: Every day | ORAL | 0 refills | Status: DC

## 2019-05-07 MED ORDER — DOXAZOSIN MESYLATE 4 MG PO TABS: 4.0000 mg | ORAL_TABLET | Freq: Every day | ORAL | 1 refills | Status: DC

## 2019-05-20 ENCOUNTER — Ambulatory Visit (INDEPENDENT_AMBULATORY_CARE_PROVIDER_SITE_OTHER): Payer: Managed Care, Other (non HMO) | Admitting: Nurse Practitioner

## 2019-05-20 ENCOUNTER — Other Ambulatory Visit: Payer: Self-pay

## 2019-05-20 ENCOUNTER — Encounter: Payer: Self-pay | Admitting: Nurse Practitioner

## 2019-05-20 VITALS — BP 119/62 | HR 54 | Temp 97.6°F | Resp 16 | Ht 68.0 in | Wt 317.0 lb

## 2019-05-20 DIAGNOSIS — I10 Essential (primary) hypertension: Secondary | ICD-10-CM

## 2019-05-20 DIAGNOSIS — I5032 Chronic diastolic (congestive) heart failure: Secondary | ICD-10-CM | POA: Diagnosis not present

## 2019-05-20 DIAGNOSIS — J4520 Mild intermittent asthma, uncomplicated: Secondary | ICD-10-CM | POA: Diagnosis not present

## 2019-05-20 DIAGNOSIS — IMO0001 Reserved for inherently not codable concepts without codable children: Secondary | ICD-10-CM

## 2019-05-20 DIAGNOSIS — K581 Irritable bowel syndrome with constipation: Secondary | ICD-10-CM | POA: Diagnosis not present

## 2019-05-20 MED ORDER — LINACLOTIDE 145 MCG PO CAPS: 145.0000 ug | ORAL_CAPSULE | Freq: Every day | ORAL | 0 refills | Status: DC

## 2019-05-20 MED ORDER — CARVEDILOL 25 MG PO TABS: 12.5000 mg | ORAL_TABLET | Freq: Two times a day (BID) | ORAL | 0 refills | Status: DC

## 2019-05-20 NOTE — Progress Notes (Signed)
[facility] [place], [place] [address]  Internal MEDICINE  Office Visit Note  Patient Name: Jason Hudson  [id number]  [id number]  Date of Service: 05/29/2019  Chief Complaint  Patient presents with  . Hypertension  . Diabetes  . Gastroesophageal Reflux  . Quality Metric Gaps    diabetic foot exam, recent hosp stay for asthma and using a short acting beta2-antagonist inhaler frequently  . Medication Refill    triseba ([facility] [place]), nitroglycerin, symbicort is too expensive  . Constipation    The patient is here for follow up of asthma. He has been on symbicort for years. Insurance has denied several recent prescriptions for this in past few months. He has run out of samples which were provided. He ran out of symbicort last week. Since this happened, he is having to use rescue inhaler twice during the day and once prior to bedtime. He states that while on symbicort, there were some days he did not have to use rescue inhaler at all. He has not had PFT or follow up with pulmonologist.  Today, he is also complaining of constipation. He states that are times when he goes three or four days without having a bowel movement. When he does go, he has to strain to have any movement. What he is able to expel are like little round, hard balls. He has not noted any blood in the stool.  He has brought blood pressure log with him. Running a bit on the low side with low pulse. Feels very fatigued. He does see cardiologist, next visit is not until December .      Current Medication: Outpatient Encounter Medications as of 05/20/2019  Medication Sig Note  . albuterol (VENTOLIN HFA) 108 (90 Base) MCG/ACT inhaler Inhale 1-2 puffs into the lungs every 6 (six) hours as needed for wheezing or shortness of breath.   Marland Kitchen aspirin EC 81 MG tablet Take 1 tablet (81 mg total) by mouth daily.   . bifidobacterium infantis (ALIGN) capsule Take 1 capsule by mouth daily.   .  budesonide-formoterol (SYMBICORT) 160-4.5 MCG/ACT inhaler Inhale 2 puffs into the lungs 2 (two) times daily.   . carvedilol (COREG) 25 MG tablet Take 0.5 tablets (12.5 mg total) by mouth 2 (two) times daily.   . Chlorphen-Phenyleph-ASA (ALKA-SELTZER PLUS COLD PO) Take 1 tablet by mouth daily as needed (congestion).    . Cholecalciferol (VITAMIN D3) 2000 units TABS Take 2,000 Units by mouth at bedtime.   Marland Kitchen diltiazem (TIAZAC) 360 MG 24 hr capsule Take 360 mg by mouth daily.    Marland Kitchen doxazosin (CARDURA) 4 MG tablet Take 1 tablet (4 mg total) by mouth at bedtime.   Marland Kitchen doxycycline (VIBRA-TABS) 100 MG tablet Take 1 daily   . Dulaglutide (TRULICITY) 1.5 OZ/3.0QM SOPN Inject 1.5 mg into the skin every Wednesday.   . fluticasone (FLONASE) 50 MCG/ACT nasal spray Place 2 sprays into both nostrils 2 (two) times daily.    Marland Kitchen glipiZIDE (GLUCOTROL XL) 10 MG 24 hr tablet Take 1 tablet (10 mg total) by mouth daily.   . Insulin Degludec (TRESIBA FLEXTOUCH) 200 UNIT/ML SOPN Tresiba FlexTouch U-200 insulin 200 unit/mL (3 mL) subcutaneous pen   . ipratropium (ATROVENT) 0.02 % nebulizer solution Take 0.5 mg by nebulization every 6 (six) hours as needed for wheezing or shortness of breath.   . loratadine (CLARITIN) 10 MG tablet Take 10 mg by mouth daily. 08/24/2018: Takes liqui-gels at home  . metolazone (ZAROXOLYN) 5 MG tablet  Take 5 mg (1 tablet) by mouth on Mondays, Wednesdays, and Fridays, about 30 minutes before taking your morning dose of furosemide (Lasix).   . multivitamin (ONE-A-DAY MEN'S) TABS tablet Take 1 tablet by mouth daily.   . mupirocin ointment (BACTROBAN) 2 % Apply to affected areas twice daily for 14 days then as needed   . nitroGLYCERIN (NITROSTAT) 0.4 MG SL tablet Place 1 tablet (0.4 mg total) under the tongue every 5 (five) minutes as needed for chest pain.   . potassium chloride (K-DUR) 10 MEQ tablet Take 1 tablet (10 mEq total) by mouth daily.   . [DISCONTINUED] carvedilol (COREG) 25 MG tablet Take 1  tablet (25 mg total) by mouth 2 (two) times daily. (Patient taking differently: Take 25 mg by mouth daily. )   . amiodarone (PACERONE) 200 MG tablet Take 1 tablet (200 mg total) by mouth daily for 30 days.   . furosemide (LASIX) 80 MG tablet Take 1 tablet (80 mg total) by mouth 2 (two) times daily for 30 days.   Marland Kitchen linaclotide (LINZESS) 145 MCG CAPS capsule Take 1 capsule (145 mcg total) by mouth daily before breakfast.   . [DISCONTINUED] benzonatate (TESSALON) 100 MG capsule Take 100 mg by mouth 2 (two) times daily.     No facility-administered encounter medications on file as of 05/20/2019.     Surgical History: Past Surgical History:  Procedure Laterality Date  . COLONOSCOPY WITH PROPOFOL N/A 11/07/2017   Procedure: COLONOSCOPY WITH PROPOFOL;  Surgeon: [name], MD;  [facility] ENDOSCOPY;  Service: Gastroenterology;  Laterality: N/A;  . ESOPHAGOGASTRODUODENOSCOPY (EGD) WITH PROPOFOL N/A 11/07/2017   Procedure: ESOPHAGOGASTRODUODENOSCOPY (EGD) WITH PROPOFOL;  Surgeon: [name], MD;  Location: [facility] ENDOSCOPY;  Service: Gastroenterology;  Laterality: N/A;  . LEFT HEART CATH AND CORONARY ANGIOGRAPHY N/A 10/23/2017   Procedure: LEFT HEART CATH AND CORONARY ANGIOGRAPHY;  Surgeon: Isaias Cowman, MD;  Location: [facility] CV LAB;  Service: Cardiovascular;  Laterality: N/A;    Medical History: Past Medical History:  Diagnosis Date  . CHF (congestive heart failure) ([place])   . COPD (chronic obstructive pulmonary disease) ([place])   . Diabetes mellitus without complication ([place])   . Gastroesophageal reflux disease 12/15/2017  . Hypertension   . Melena 11/05/2017  . Pneumonia 07/27/2016    Family History: Family History  Problem Relation Age of Onset  . Cancer Mother   . CVA Father   . Heart disease Father     Social History   Socioeconomic History  . Marital status: Single    Spouse name: Not on file  . Number of children: Not on file  . Years of education: Not on file  .  Highest education level: Not on file  Occupational History  . Not on file  Social Needs  . Financial resource strain: Not on file  . Food insecurity    Worry: Not on file    Inability: Not on file  . Transportation needs    Medical: Not on file    Non-medical: Not on file  Tobacco Use  . Smoking status: Never Smoker  . Smokeless tobacco: Never Used  Substance and Sexual Activity  . Alcohol use: No  . Drug use: Never  . Sexual activity: Not on file  Lifestyle  . Physical activity    Days per week: Not on file    Minutes per session: Not on file  . Stress: Not on file  Relationships  . Social Herbalist on phone: Not  on file    Gets together: Not on file    Attends religious service: Not on file    Active member of club or organization: Not on file    Attends meetings of clubs or organizations: Not on file    Relationship status: Not on file  . Intimate partner violence    Fear of current or ex partner: Not on file    Emotionally abused: Not on file    Physically abused: Not on file    Forced sexual activity: Not on file  Other Topics Concern  . Not on file  Social History Narrative  . Not on file      Review of Systems  Constitutional: Positive for fatigue. Negative for activity change, chills and unexpected weight change.  HENT: Negative for congestion, postnasal drip, rhinorrhea, sneezing and sore throat.   Respiratory: Positive for shortness of breath and wheezing. Negative for cough and chest tightness.        Intermittent and well controlled with current medication.   Cardiovascular: Positive for leg swelling. Negative for chest pain and palpitations.  Gastrointestinal: Positive for constipation. Negative for abdominal pain, diarrhea, nausea and vomiting.  Endocrine: Negative for cold intolerance, heat intolerance, polydipsia and polyuria.       Blood sugars doing well    Musculoskeletal: Negative for arthralgias, back pain, joint swelling and neck  pain.  Skin: Positive for wound. Negative for rash.       Lesions to bilateral lower extremities have improved. There continues to be some redness and edema in both lower extremities.   Neurological: Negative for dizziness, tremors, numbness and headaches.  Hematological: Negative for adenopathy. Does not bruise/bleed easily.  Psychiatric/Behavioral: Negative for behavioral problems (Depression), sleep disturbance and suicidal ideas. The patient is nervous/anxious.     Today's Vitals   05/20/19 1133  BP: 119/62  Pulse: (!) 54  Resp: 16  Temp: 97.6 F (36.4 C)  SpO2: 92%  Weight: (!) 317 lb (143.8 kg)  Height: 5\' 8"  (1.727 m)   Body mass index is 48.2 kg/m.  Physical Exam Vitals signs and nursing note reviewed.  Constitutional:      General: He is not in acute distress.    Appearance: Normal appearance. He is well-developed. He is obese. He is not diaphoretic.  HENT:     Head: Normocephalic and atraumatic.     Mouth/Throat:     Pharynx: No oropharyngeal exudate.  Eyes:     Pupils: Pupils are equal, round, and reactive to light.  Neck:     Musculoskeletal: Normal range of motion and neck supple.     Thyroid: No thyromegaly.     Vascular: No carotid bruit or JVD.     Trachea: No tracheal deviation.  Cardiovascular:     Rate and Rhythm: Normal rate. Rhythm irregular.     Heart sounds: Murmur present. No friction rub. No gallop.   Pulmonary:     Effort: Pulmonary effort is normal. No respiratory distress.     Breath sounds: Normal breath sounds. No wheezing or rales.     Comments: Increased effort to breath with exertion.  Chest:     Chest wall: No tenderness.  Abdominal:     Palpations: Abdomen is soft.  Musculoskeletal: Normal range of motion.  Lymphadenopathy:     Cervical: No cervical adenopathy.  Skin:    General: Skin is warm and dry.     Comments: There are multiple small, round lesions on bilateral lower extremities. Most have  scabs and have new, healthy skin  growth. There continues to be redness and swelling in both lower extremities. Both are still tender, however, no warmth can be felt with palpation of the lower legs/ appearance of skin on both lower legs has improved significantly   Neurological:     Mental Status: He is alert and oriented to person, place, and time.     Cranial Nerves: No cranial nerve deficit.  Psychiatric:        Behavior: Behavior normal.        Thought Content: Thought content normal.        Judgment: Judgment normal.    Assessment/Plan: 1. Moderate intermittent asthma without complication Continue inhalers and respiratory medication as prescribed. Pulmonary function test ordered. Refer to pulmonology for further evaluation and treatment.  - Pulmonary function test; Future - Ambulatory referral to Pulmonology  2. Essential hypertension Reduce coreg to 12.5mg  twice daily. Continue other BP medication as prescribed  - carvedilol (COREG) 25 MG tablet; Take 0.5 tablets (12.5 mg total) by mouth 2 (two) times daily.  Dispense: 60 tablet; Refill: 0  3. Irritable bowel syndrome with constipation Start linzess 145mcg daily. Ensure proper fiber and water intake.  - linaclotide (LINZESS) 145 MCG CAPS capsule; Take 1 capsule (145 mcg total) by mouth daily before breakfast.  Dispense: 30 capsule; Refill: 0  4. Chronic diastolic congestive heart failure ([place]) Follow up with cardiology as scheduled.   General Counseling: Malva Cogan understanding of the findings of todays visit and agrees with plan of treatment. I have discussed any further diagnostic evaluation that may be needed or ordered today. We also reviewed his medications today. he has been encouraged to call the office with any questions or concerns that should arise related to todays visit.  This patient was seen by Leretha Pol FNP Collaboration with Dr Lavera Guise as a part of collaborative care agreement  Orders Placed This Encounter  Procedures  .  Ambulatory referral to Pulmonology  . Pulmonary function test    Meds ordered this encounter  Medications  . carvedilol (COREG) 25 MG tablet    Sig: Take 0.5 tablets (12.5 mg total) by mouth 2 (two) times daily.    Dispense:  60 tablet    Refill:  0    Reduce dose until next visit .    Order Specific Question:   Supervising Provider    Answer:   Lavera Guise [[id number]]  . linaclotide (LINZESS) 145 MCG CAPS capsule    Sig: Take 1 capsule (145 mcg total) by mouth daily before breakfast.    Dispense:  30 capsule    Refill:  0    Samples provided    Order Specific Question:   Supervising Provider    Answer:   Lavera Guise [[id number]]    Time spent: 25 Minutes      Dr Lavera Guise Internal medicine

## 2019-05-22 DIAGNOSIS — E1122 Type 2 diabetes mellitus with diabetic chronic kidney disease: Secondary | ICD-10-CM | POA: Insufficient documentation

## 2019-05-29 ENCOUNTER — Ambulatory Visit (INDEPENDENT_AMBULATORY_CARE_PROVIDER_SITE_OTHER): Payer: Managed Care, Other (non HMO) | Admitting: Internal Medicine

## 2019-05-29 ENCOUNTER — Other Ambulatory Visit: Payer: Self-pay

## 2019-05-29 DIAGNOSIS — R0602 Shortness of breath: Secondary | ICD-10-CM

## 2019-05-29 DIAGNOSIS — K581 Irritable bowel syndrome with constipation: Secondary | ICD-10-CM | POA: Insufficient documentation

## 2019-05-29 LAB — PULMONARY FUNCTION TEST

## 2019-05-30 ENCOUNTER — Other Ambulatory Visit: Payer: Self-pay | Admitting: Student

## 2019-05-30 DIAGNOSIS — N184 Chronic kidney disease, stage 4 (severe): Secondary | ICD-10-CM

## 2019-06-06 ENCOUNTER — Ambulatory Visit: Payer: Managed Care, Other (non HMO) | Admitting: Internal Medicine

## 2019-06-10 ENCOUNTER — Ambulatory Visit: Payer: Managed Care, Other (non HMO)

## 2019-06-17 NOTE — Procedures (Signed)
[place], [id number]  DATE OF SERVICE: May 29, 2019  Complete Pulmonary Function Testing Interpretation:  FINDINGS:  The forced vital capacity is moderately decreased.  The FEV1 is 2.22 L which is mildly decreased.  Bronchodilator there does not appear to be any significant improvement in the FEV1.  The FEV1 FVC ratio is normal.  Total lung capacity is severely decreased.  Residual volume is decreased residual in total lung capacity ratio is increased.  Thoracic gas volume is decreased.  DLCO is moderately decreased.  IMPRESSION:  This pulmonary function study is consistent with severe restrictive lung disease.  There is a reduction of DLCO and this remains when corrected for alveolar volume.  Clinical correlation is recommended  Allyne Gee, MD [facility] Pulmonary Critical Care Medicine Sleep Medicine

## 2019-06-23 NOTE — Progress Notes (Signed)
Review at visit 07/09/2019

## 2019-06-25 ENCOUNTER — Other Ambulatory Visit: Payer: Self-pay

## 2019-06-25 ENCOUNTER — Ambulatory Visit
Admission: RE | Admit: 2019-06-25 | Discharge: 2019-06-25 | Disposition: A | Discharge status: Home / Self Care | Payer: Managed Care, Other (non HMO) | Source: Ambulatory Visit | Attending: Student | Admitting: Student

## 2019-06-25 DIAGNOSIS — N184 Chronic kidney disease, stage 4 (severe): Secondary | ICD-10-CM

## 2019-07-09 ENCOUNTER — Encounter: Payer: Self-pay | Admitting: Nurse Practitioner

## 2019-07-13 IMAGING — CR DG CHEST 2V
1 series · 2 of 2 positions shown · non-contrast
Comparison: October 20, 2017

CLINICAL DATA: Shortness of breath

EXAM:
CHEST - 2 VIEW

[Series 1: dg chest 2 view · 0.14mm/px · 2 of 2 slices shown]
[im 1/2]
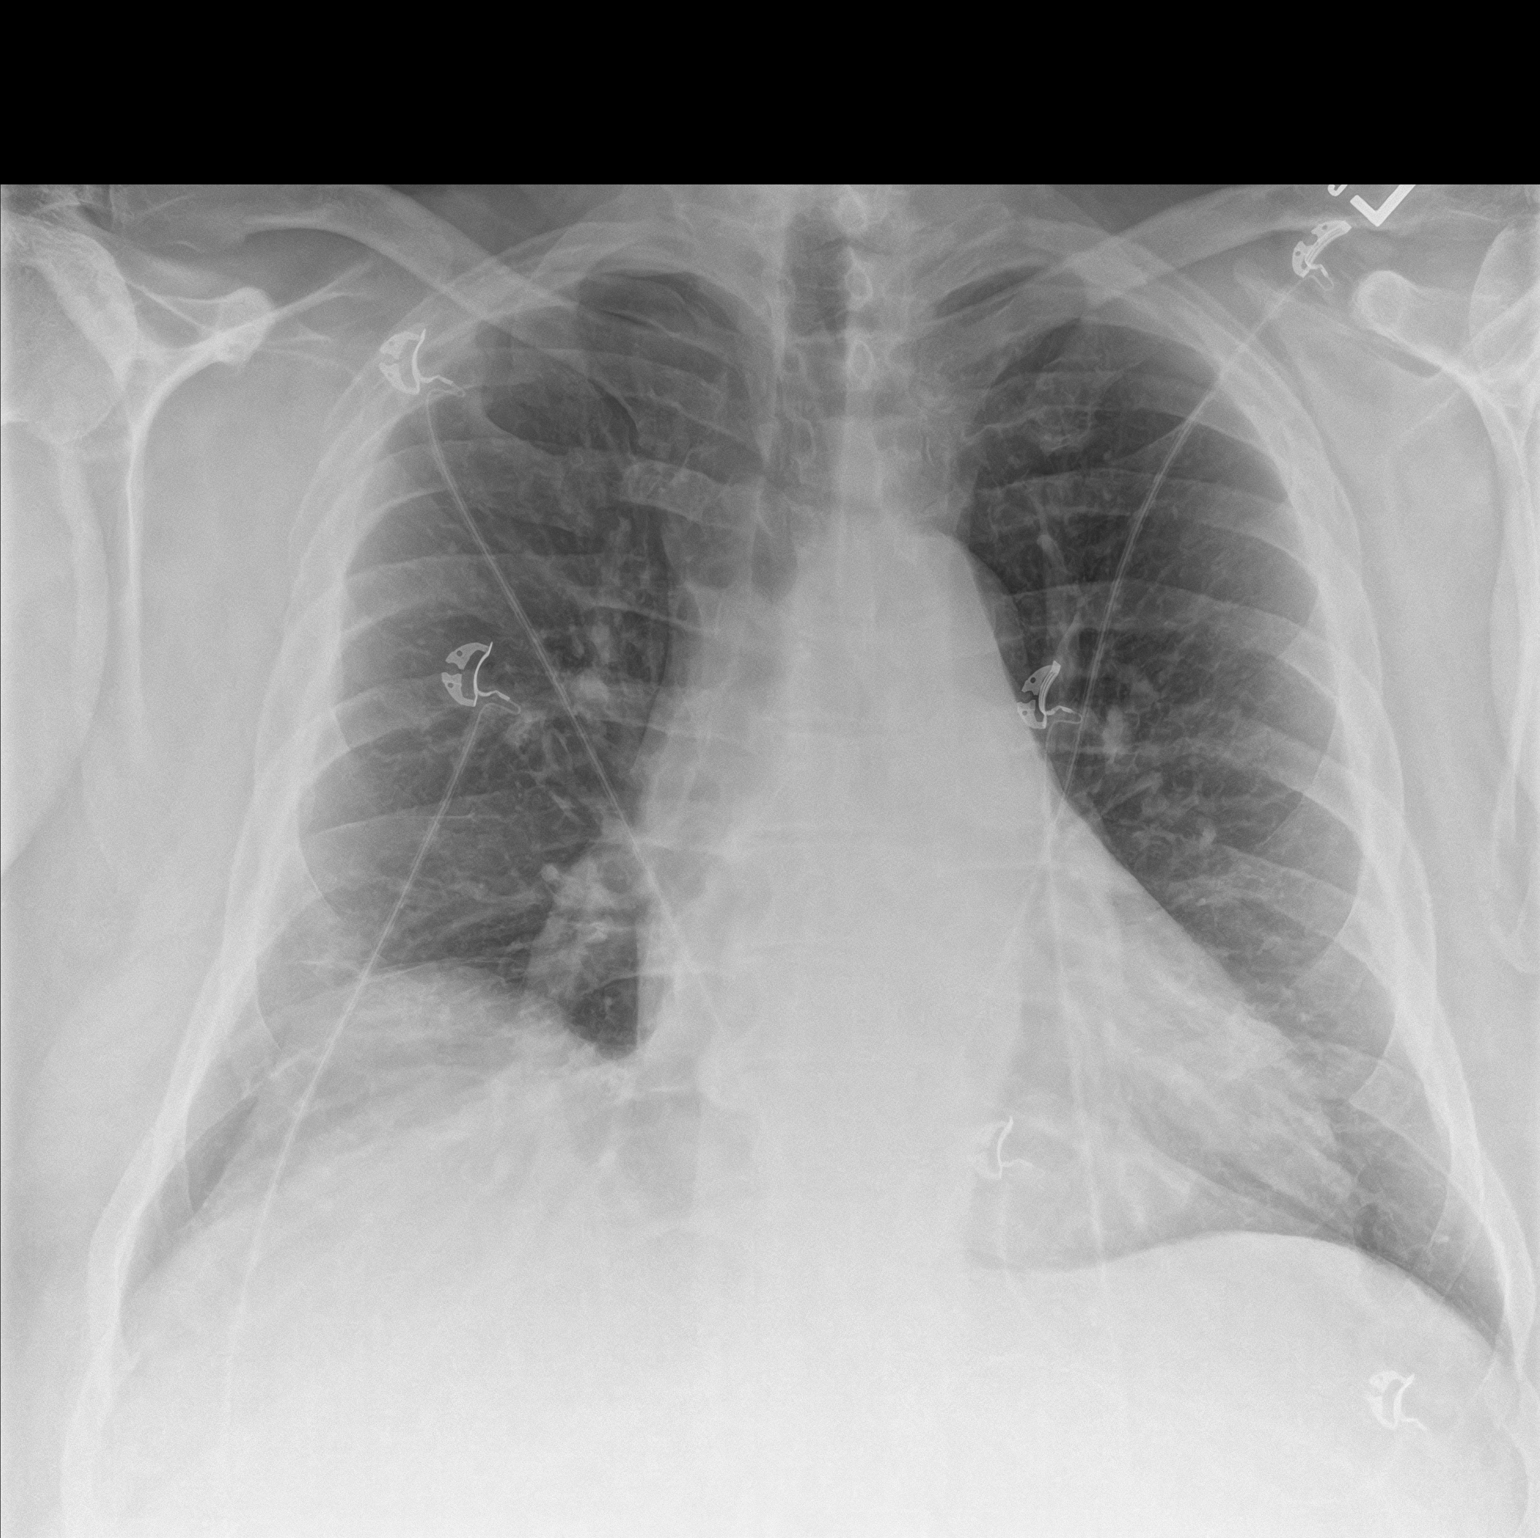
[im 2/2]
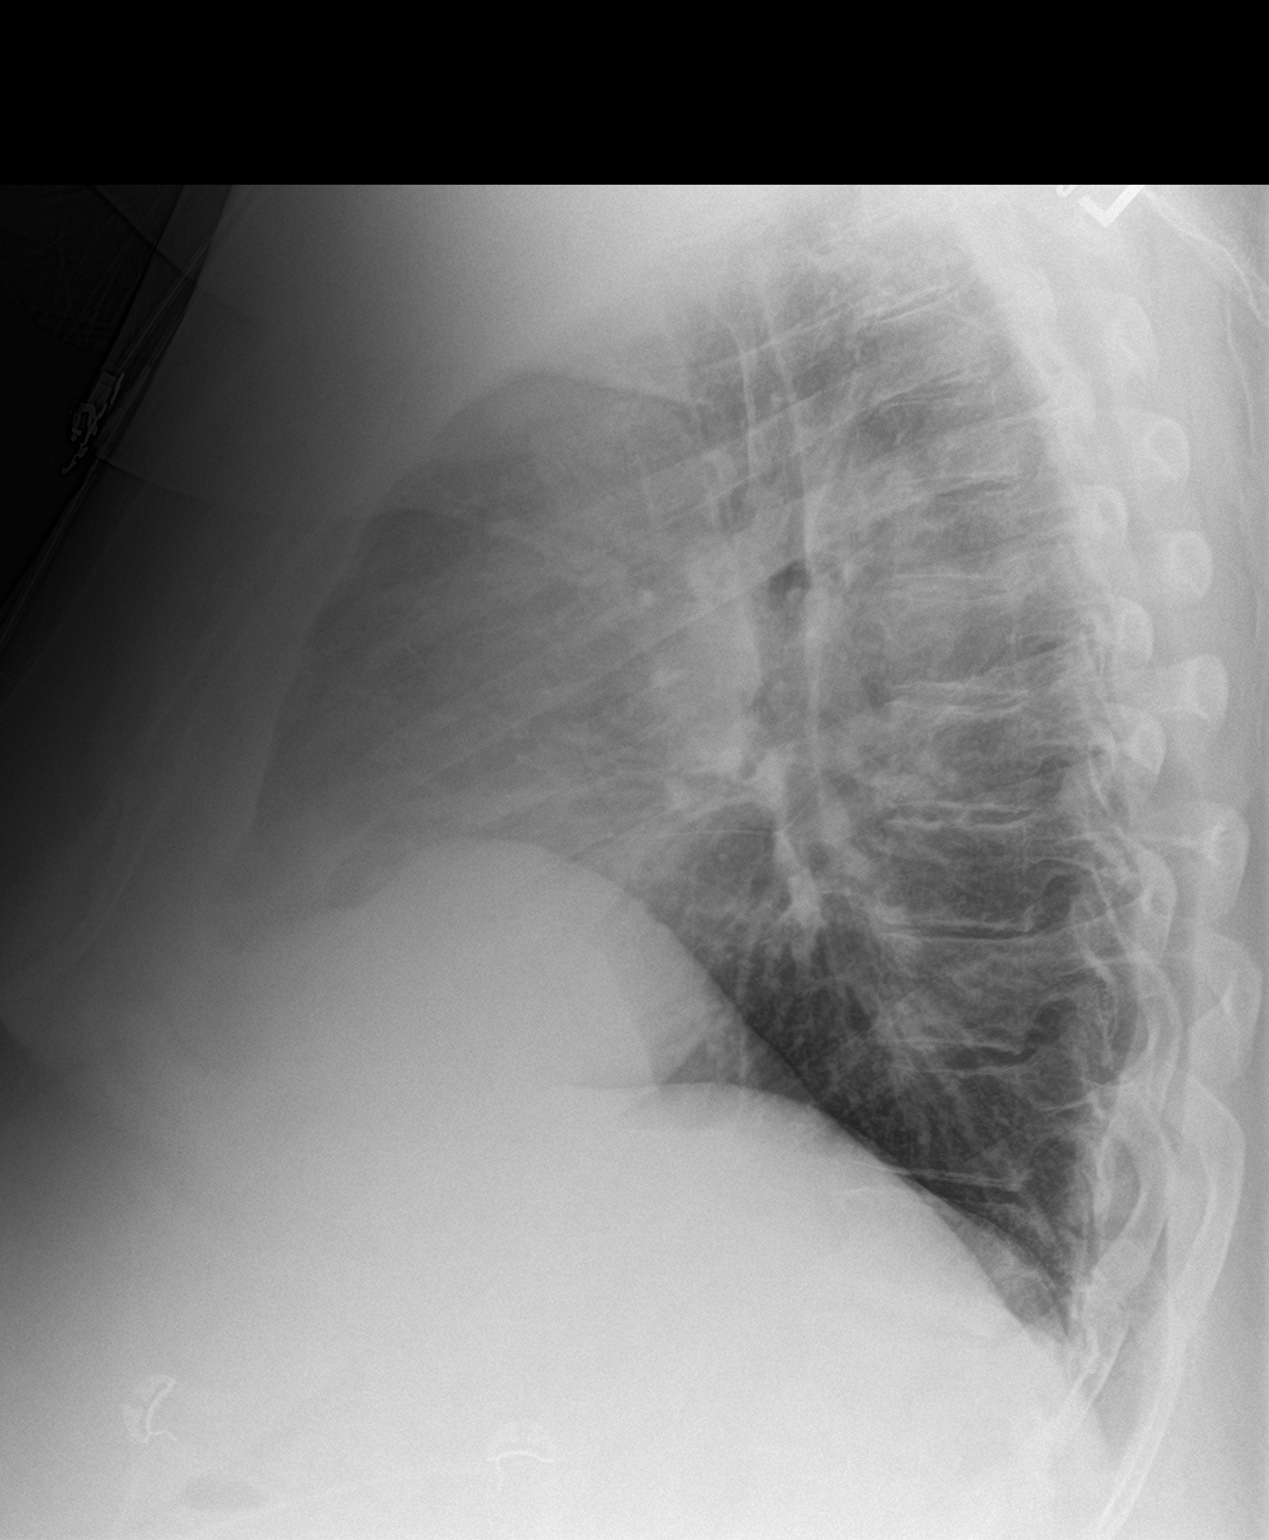

[2 of 2 positions shown; findings below may reference images not displayed]

FINDINGS: Slight scarring in the right base. No edema or consolidation. Heart
is upper normal in size with pulmonary vascularity normal. No
adenopathy. No bone lesions.
IMPRESSION: Slight scarring left base. No edema or consolidation. Stable cardiac
silhouette.

## 2019-08-15 ENCOUNTER — Other Ambulatory Visit: Payer: Self-pay

## 2019-08-15 DIAGNOSIS — E1159 Type 2 diabetes mellitus with other circulatory complications: Secondary | ICD-10-CM

## 2019-08-15 DIAGNOSIS — I152 Hypertension secondary to endocrine disorders: Secondary | ICD-10-CM

## 2019-08-15 MED ORDER — DOXAZOSIN MESYLATE 4 MG PO TABS: 4.0000 mg | ORAL_TABLET | Freq: Every day | ORAL | 2 refills | Status: DC

## 2019-08-20 ENCOUNTER — Other Ambulatory Visit: Payer: Self-pay

## 2019-08-20 DIAGNOSIS — E1122 Type 2 diabetes mellitus with diabetic chronic kidney disease: Secondary | ICD-10-CM

## 2019-08-20 MED ORDER — GLIPIZIDE ER 10 MG PO TB24: 10.0000 mg | ORAL_TABLET | Freq: Every day | ORAL | 5 refills | Status: DC

## 2019-08-22 ENCOUNTER — Telehealth: Payer: Self-pay

## 2019-08-22 NOTE — Telephone Encounter (Signed)
CONFIRMED AND SCREENED FOR 08-27-19 OV. 

## 2019-08-27 ENCOUNTER — Encounter (INDEPENDENT_AMBULATORY_CARE_PROVIDER_SITE_OTHER): Payer: Self-pay

## 2019-08-27 ENCOUNTER — Encounter: Payer: Self-pay | Admitting: Nurse Practitioner

## 2019-08-27 ENCOUNTER — Other Ambulatory Visit: Payer: Self-pay

## 2019-08-27 ENCOUNTER — Ambulatory Visit (INDEPENDENT_AMBULATORY_CARE_PROVIDER_SITE_OTHER): Payer: Managed Care, Other (non HMO) | Admitting: Nurse Practitioner

## 2019-08-27 VITALS — BP 144/68 | HR 69 | Resp 16 | Ht 68.0 in | Wt 316.4 lb

## 2019-08-27 DIAGNOSIS — J4520 Mild intermittent asthma, uncomplicated: Secondary | ICD-10-CM

## 2019-08-27 DIAGNOSIS — R300 Dysuria: Secondary | ICD-10-CM

## 2019-08-27 DIAGNOSIS — Z23 Encounter for immunization: Secondary | ICD-10-CM

## 2019-08-27 DIAGNOSIS — Z0001 Encounter for general adult medical examination with abnormal findings: Secondary | ICD-10-CM | POA: Diagnosis not present

## 2019-08-27 DIAGNOSIS — E1165 Type 2 diabetes mellitus with hyperglycemia: Secondary | ICD-10-CM

## 2019-08-27 DIAGNOSIS — IMO0001 Reserved for inherently not codable concepts without codable children: Secondary | ICD-10-CM

## 2019-08-27 LAB — POCT GLYCOSYLATED HEMOGLOBIN (HGB A1C): Hemoglobin A1C: 7.5 % — AB (ref 4.0–5.6)

## 2019-08-27 MED ORDER — PNEUMOCOCCAL 13-VAL CONJ VACC IM SUSP: 0.5000 mL | Freq: Once | INTRAMUSCULAR | 0 refills | Status: AC

## 2019-08-27 MED ORDER — TRELEGY ELLIPTA 100-62.5-25 MCG/INH IN AEPB: 1.0000 | INHALATION_SPRAY | Freq: Every day | RESPIRATORY_TRACT | 3 refills | Status: DC

## 2019-08-27 NOTE — Progress Notes (Signed)
[facility] [place], [place] [address]  Internal MEDICINE  Office Visit Note  Patient Name: Jason Hudson  [id number]  [id number]  Date of Service: 08/31/2019   Pt is here for routine health maintenance examination  Chief Complaint  Patient presents with  . Annual Exam    pft results   . Diabetes  . Hypertension  . Gastroesophageal Reflux  . Quality Metric Gaps    diabetic foot exam and pna vacc  . Medication Refill    would like to try symbicort being sent to [facility] if he can get it      The patient is here for health maintenance exam. His blood sugars have been a bit elevated recently. He does see endocrinology and they are manipulating his medications at this time. His HgbA1c is 7.5 today. He does have chronic kidney disease and sees nephrology. He did have pulmonary function test. This did show severe restrictive lung disease he was to see pulmonology after this test, but appointment was, somehow, missed. He     Current Medication: Outpatient Encounter Medications as of 08/27/2019  Medication Sig Note  . albuterol (VENTOLIN HFA) 108 (90 Base) MCG/ACT inhaler Inhale 1-2 puffs into the lungs every 6 (six) hours as needed for wheezing or shortness of breath.   . bifidobacterium infantis (ALIGN) capsule Take 1 capsule by mouth daily.   . carvedilol (COREG) 25 MG tablet Take 0.5 tablets (12.5 mg total) by mouth 2 (two) times daily.   . Chlorphen-Phenyleph-ASA (ALKA-SELTZER PLUS COLD PO) Take 1 tablet by mouth daily as needed (congestion).    . Cholecalciferol (VITAMIN D3) 2000 units TABS Take 2,000 Units by mouth at bedtime.   Marland Kitchen diltiazem (TIAZAC) 360 MG 24 hr capsule Take 360 mg by mouth daily.    Marland Kitchen doxazosin (CARDURA) 4 MG tablet Take 1 tablet (4 mg total) by mouth at bedtime.   . Dulaglutide (TRULICITY) 1.5 GN/5.6OZ SOPN Inject 1.5 mg into the skin every Wednesday.   . fluticasone (FLONASE) 50 MCG/ACT nasal spray Place 2 sprays into both  nostrils 2 (two) times daily.    Marland Kitchen glipiZIDE (GLUCOTROL XL) 10 MG 24 hr tablet Take 1 tablet (10 mg total) by mouth daily.   . Insulin Degludec (TRESIBA FLEXTOUCH) 200 UNIT/ML SOPN    . ipratropium (ATROVENT) 0.02 % nebulizer solution Take 0.5 mg by nebulization every 6 (six) hours as needed for wheezing or shortness of breath.   . linaclotide (LINZESS) 145 MCG CAPS capsule Take 1 capsule (145 mcg total) by mouth daily before breakfast.   . loratadine (CLARITIN) 10 MG tablet Take 10 mg by mouth daily. 08/24/2018: Takes liqui-gels at home  . metolazone (ZAROXOLYN) 5 MG tablet Take 5 mg (1 tablet) by mouth on Mondays, Wednesdays, and Fridays, about 30 minutes before taking your morning dose of furosemide (Lasix).   . multivitamin (ONE-A-DAY MEN'S) TABS tablet Take 1 tablet by mouth daily.   . mupirocin ointment (BACTROBAN) 2 % Apply to affected areas twice daily for 14 days then as needed   . nitroGLYCERIN (NITROSTAT) 0.4 MG SL tablet Place 1 tablet (0.4 mg total) under the tongue every 5 (five) minutes as needed for chest pain.   . potassium chloride (K-DUR) 10 MEQ tablet Take 1 tablet (10 mEq total) by mouth daily.   Marland Kitchen amiodarone (PACERONE) 200 MG tablet Take 1 tablet (200 mg total) by mouth daily for 30 days.   . Fluticasone-Umeclidin-Vilant (TRELEGY ELLIPTA) 100-62.5-25 MCG/INH AEPB Inhale 1 puff  into the lungs daily.   . furosemide (LASIX) 80 MG tablet Take 1 tablet (80 mg total) by mouth 2 (two) times daily for 30 days.   . [EXPIRED] pneumococcal 13-valent conjugate vaccine (PREVNAR 13) SUSP injection Inject 0.5 mLs into the muscle once for 1 dose.   . [DISCONTINUED] budesonide-formoterol (SYMBICORT) 160-4.5 MCG/ACT inhaler Inhale 2 puffs into the lungs 2 (two) times daily. (Patient not taking: Reported on 08/27/2019)   . [DISCONTINUED] doxycycline (VIBRA-TABS) 100 MG tablet Take 1 daily (Patient not taking: Reported on 08/27/2019)    No facility-administered encounter medications on file as of  08/27/2019.    Surgical History: Past Surgical History:  Procedure Laterality Date  . COLONOSCOPY WITH PROPOFOL N/A 11/07/2017   Procedure: COLONOSCOPY WITH PROPOFOL;  Surgeon: [name], MD;  Location: [facility] ENDOSCOPY;  Service: Gastroenterology;  Laterality: N/A;  . ESOPHAGOGASTRODUODENOSCOPY (EGD) WITH PROPOFOL N/A 11/07/2017   Procedure: ESOPHAGOGASTRODUODENOSCOPY (EGD) WITH PROPOFOL;  Surgeon: [name], MD;  Location: [facility] ENDOSCOPY;  Service: Gastroenterology;  Laterality: N/A;  . LEFT HEART CATH AND CORONARY ANGIOGRAPHY N/A 10/23/2017   Procedure: LEFT HEART CATH AND CORONARY ANGIOGRAPHY;  Surgeon: Isaias Cowman, MD;  Location: [place] CV LAB;  Service: Cardiovascular;  Laterality: N/A;    Medical History: Past Medical History:  Diagnosis Date  . CHF (congestive heart failure) ([place])   . COPD (chronic obstructive pulmonary disease) ([place])   . Diabetes mellitus without complication ([place])   . Gastroesophageal reflux disease 12/15/2017  . Hypertension   . Melena 11/05/2017  . Pneumonia 07/27/2016    Family History: Family History  Problem Relation Age of Onset  . Cancer Mother   . CVA Father   . Heart disease Father       Review of Systems  Constitutional: Positive for fatigue. Negative for activity change, chills and unexpected weight change.  HENT: Negative for congestion, postnasal drip, rhinorrhea, sneezing and sore throat.   Respiratory: Positive for shortness of breath and wheezing. Negative for cough and chest tightness.        Intermittent and well controlled with current medication.   Cardiovascular: Positive for leg swelling. Negative for chest pain and palpitations.  Gastrointestinal: Positive for constipation. Negative for abdominal pain, diarrhea, nausea and vomiting.  Endocrine: Negative for cold intolerance, heat intolerance, polydipsia and polyuria.       Blood sugars moderately elevated.    Musculoskeletal: Negative for arthralgias, back pain,  joint swelling and neck pain.  Skin: Positive for wound. Negative for rash.       Lesions to bilateral lower extremities have improved. There continues to be some redness and edema in both lower extremities.   Allergic/Immunologic: Negative for environmental allergies.  Neurological: Negative for dizziness, tremors, numbness and headaches.  Hematological: Negative for adenopathy. Does not bruise/bleed easily.  Psychiatric/Behavioral: Negative for behavioral problems (Depression), sleep disturbance and suicidal ideas. The patient is nervous/anxious.      Today's Vitals   08/27/19 1352  BP: (!) 144/68  Pulse: 69  Resp: 16  SpO2: 97%  Weight: (!) 316 lb 6.4 oz (143.5 kg)  Height: 5\' 8"  (1.727 m)   Body mass index is 48.11 kg/m.  Physical Exam Vitals and nursing note reviewed.  Constitutional:      General: He is not in acute distress.    Appearance: Normal appearance. He is well-developed. He is obese. He is not diaphoretic.  HENT:     Head: Normocephalic and atraumatic.     Mouth/Throat:     Pharynx: No  oropharyngeal exudate.  Eyes:     Pupils: Pupils are equal, round, and reactive to light.  Neck:     Thyroid: No thyromegaly.     Vascular: No carotid bruit or JVD.     Trachea: No tracheal deviation.  Cardiovascular:     Rate and Rhythm: Normal rate. Rhythm irregular.     Heart sounds: Murmur present. No friction rub. No gallop.   Pulmonary:     Effort: Pulmonary effort is normal. No respiratory distress.     Breath sounds: Normal breath sounds. No wheezing or rales.     Comments: Increased effort to breath with exertion.  Chest:     Chest wall: No tenderness.  Abdominal:     General: Bowel sounds are normal.     Palpations: Abdomen is soft.     Tenderness: There is no abdominal tenderness.  Musculoskeletal:        General: Normal range of motion.     Cervical back: Normal range of motion and neck supple.  Lymphadenopathy:     Cervical: No cervical adenopathy.   Skin:    General: Skin is warm and dry.     Comments: Few small, round lesions which have scabs and have new, healthy skin growth. There continues to bemild swelling in both lower extremities. Both are tender, however, no warmth can be felt with palpation of the lower legs/ appearance of skin on both lower legs has improved significantly   Neurological:     Mental Status: He is alert and oriented to person, place, and time.     Cranial Nerves: No cranial nerve deficit.  Psychiatric:        Behavior: Behavior normal.        Thought Content: Thought content normal.        Judgment: Judgment normal.    Depression screen [facility] 2/9 08/27/2019 05/20/2019 03/08/2019  Decreased Interest 0 0 0  Down, Depressed, Hopeless 0 0 0  PHQ - 2 Score 0 0 0    Functional Status Survey: Is the patient deaf or have difficulty hearing?: Yes Does the patient have difficulty seeing, even when wearing glasses/contacts?: Yes Does the patient have difficulty concentrating, remembering, or making decisions?: No Does the patient have difficulty walking or climbing stairs?: No Does the patient have difficulty dressing or bathing?: No Does the patient have difficulty doing errands alone such as visiting a doctor's office or shopping?: No  MMSE - [place] Exam 08/31/2019  Orientation to time 5  Orientation to Place 5  Registration 3  Attention/ Calculation 5  Recall 3  Language- name 2 objects 2  Language- repeat 1  Language- follow 3 step command 3  Language- read & follow direction 1  Write a sentence 1  Copy design 1  Total score 30    Fall Risk  08/27/2019 05/20/2019 03/08/2019  Falls in the past year? 0 0 0      LABS: Recent Results (from the past 2160 hour(s))  UA/M w/rflx Culture, Routine     Status: Abnormal   Collection Time: 08/27/19  1:51 PM   Specimen: Urine   URINE  Result Value Ref Range   Specific Gravity, UA 1.010 1.005 - 1.030   pH, UA 8.0 (H) 5.0 - 7.5   Color, UA Yellow  Yellow   Appearance Ur Clear Clear   Leukocytes,UA Negative Negative   Protein,UA Negative Negative/Trace   Glucose, UA Negative Negative   Ketones, UA Negative Negative   RBC, UA  Negative Negative   Bilirubin, UA Negative Negative   Urobilinogen, Ur 0.2 0.2 - 1.0 mg/dL   Nitrite, UA Negative Negative   Microscopic Examination Comment     Comment: Microscopic follows if indicated.   Microscopic Examination See below:     Comment: Microscopic was indicated and was performed.   Urinalysis Reflex Comment     Comment: This specimen will not reflex to a Urine Culture.  Microscopic Examination     Status: Abnormal   Collection Time: 08/27/19  1:51 PM   URINE  Result Value Ref Range   WBC, UA 0-5 0 - 5 /hpf   RBC 0-2 0 - 2 /hpf   Epithelial Cells (non renal) 0-10 0 - 10 /hpf   Casts Present (A) None seen /lpf   Cast Type Hyaline casts N/A   Mucus, UA Present Not Estab.   Bacteria, UA None seen None seen/Few  POCT HgB A1C     Status: Abnormal   Collection Time: 08/27/19  2:12 PM  Result Value Ref Range   Hemoglobin A1C 7.5 (A) 4.0 - 5.6 %   HbA1c POC (<> result, manual entry)     HbA1c, POC (prediabetic range)     HbA1c, POC (controlled diabetic range)      Assessment/Plan: 1. Encounter for general adult medical examination with abnormal findings Annual health maintenance exam today.   2. Type 2 diabetes mellitus with hyperglycemia, unspecified whether long term insulin use (HCC) - POCT HgB A1C 7.5 today. Patient should continue regular visits with endocrinology as scheduled.   3. Moderate intermittent asthma without complication Reviewed results of pulmonary function test. This is showing severe restrictive lung disease. Will start trelegy once daily. He should continue to use rescue inhaler as needed and as prescribed. Refer to pulmonology for further evaluation and treatment.  - Ambulatory referral to Pulmonology - Fluticasone-Umeclidin-Vilant (TRELEGY ELLIPTA) 100-62.5-25  MCG/INH AEPB; Inhale 1 puff into the lungs daily.  Dispense: 3 each; Refill: 3  4. Flu vaccine need Flu vaccine administered today.  - Flu Vaccine  5. Need for vaccination against Streptococcus pneumoniae using pneumococcal conjugate vaccine 13 Prescription for prevnar 13 sent to his pharmacy for administration.  - pneumococcal 13-valent conjugate vaccine (PREVNAR 13) SUSP injection; Inject 0.5 mLs into the muscle once for 1 dose.  Dispense: 0.5 mL; Refill: 0  6. Dysuria - UA/M w/rflx Culture, Routine  General Counseling: Malva Cogan understanding of the findings of todays visit and agrees with plan of treatment. I have discussed any further diagnostic evaluation that may be needed or ordered today. We also reviewed his medications today. he has been encouraged to call the office with any questions or concerns that should arise related to todays visit.    Counseling:  Cardiac risk factor modification:  1. Control blood pressure. 2. Exercise as prescribed. 3. Follow low sodium, low fat diet. and low fat and low cholestrol diet. 4. Take ASA 81mg  once a day. 5. Restricted calories diet to lose weight.  This patient was seen by Leretha Pol FNP Collaboration with Dr Lavera Guise as a part of collaborative care agreement  Orders Placed This Encounter  Procedures  . Microscopic Examination  . Flu Vaccine  . UA/M w/rflx Culture, Routine  . Ambulatory referral to Pulmonology  . POCT HgB A1C    Meds ordered this encounter  Medications  . Fluticasone-Umeclidin-Vilant (TRELEGY ELLIPTA) 100-62.5-25 MCG/INH AEPB    Sig: Inhale 1 puff into the lungs daily.    Dispense:  3 each    Refill:  3    The patient was provided samples 08/27/2019    Order Specific Question:   Supervising Provider    Answer:   Lavera Guise [[id number]]  . pneumococcal 13-valent conjugate vaccine (PREVNAR 13) SUSP injection    Sig: Inject 0.5 mLs into the muscle once for 1 dose.    Dispense:  0.5 mL     Refill:  0    Order Specific Question:   Supervising Provider    Answer:   Lavera Guise [[id number]]    Total Time spent: [place], MD  Internal Medicine

## 2019-08-28 LAB — MICROSCOPIC EXAMINATION: Bacteria, UA: NONE SEEN

## 2019-08-28 LAB — UA/M W/RFLX CULTURE, ROUTINE
Bilirubin, UA: NEGATIVE
Glucose, UA: NEGATIVE
Ketones, UA: NEGATIVE
Leukocytes,UA: NEGATIVE
Nitrite, UA: NEGATIVE
Protein,UA: NEGATIVE
RBC, UA: NEGATIVE
Specific Gravity, UA: 1.01 (ref 1.005–1.030)
Urobilinogen, Ur: 0.2 mg/dL (ref 0.2–1.0)
pH, UA: 8 — ABNORMAL HIGH (ref 5.0–7.5)

## 2019-08-31 DIAGNOSIS — Z0001 Encounter for general adult medical examination with abnormal findings: Secondary | ICD-10-CM | POA: Insufficient documentation

## 2019-08-31 DIAGNOSIS — R300 Dysuria: Secondary | ICD-10-CM | POA: Insufficient documentation

## 2019-08-31 DIAGNOSIS — Z23 Encounter for immunization: Secondary | ICD-10-CM | POA: Insufficient documentation

## 2019-09-05 ENCOUNTER — Other Ambulatory Visit
Admission: RE | Admit: 2019-09-05 | Discharge: 2019-09-05 | Disposition: A | Discharge status: Home / Self Care | Payer: Managed Care, Other (non HMO) | Source: Ambulatory Visit | Attending: Internal Medicine | Admitting: Internal Medicine

## 2019-09-05 DIAGNOSIS — Z01818 Encounter for other preprocedural examination: Secondary | ICD-10-CM | POA: Insufficient documentation

## 2019-09-05 LAB — BRAIN NATRIURETIC PEPTIDE: B Natriuretic Peptide: 27 pg/mL (ref 0.0–100.0)

## 2019-10-08 ENCOUNTER — Ambulatory Visit
Admission: RE | Admit: 2019-10-08 | Discharge: 2019-10-08 | Disposition: A | Discharge status: Home / Self Care | Payer: Managed Care, Other (non HMO) | Attending: Adult Health | Admitting: Adult Health

## 2019-10-08 ENCOUNTER — Encounter: Payer: Self-pay | Admitting: Adult Health

## 2019-10-08 ENCOUNTER — Other Ambulatory Visit: Payer: Self-pay

## 2019-10-08 ENCOUNTER — Ambulatory Visit (INDEPENDENT_AMBULATORY_CARE_PROVIDER_SITE_OTHER): Payer: Managed Care, Other (non HMO) | Admitting: Adult Health

## 2019-10-08 ENCOUNTER — Ambulatory Visit
Admission: RE | Admit: 2019-10-08 | Discharge: 2019-10-08 | Disposition: A | Discharge status: Home / Self Care | Payer: Managed Care, Other (non HMO) | Source: Ambulatory Visit | Attending: Adult Health | Admitting: Adult Health

## 2019-10-08 VITALS — BP 139/69 | HR 75 | Temp 97.9°F | Resp 16 | Ht 68.0 in | Wt 306.0 lb

## 2019-10-08 DIAGNOSIS — L03031 Cellulitis of right toe: Secondary | ICD-10-CM

## 2019-10-08 DIAGNOSIS — M79604 Pain in right leg: Secondary | ICD-10-CM

## 2019-10-08 MED ORDER — SULFAMETHOXAZOLE-TRIMETHOPRIM 400-80 MG PO TABS: 1.0000 | ORAL_TABLET | Freq: Two times a day (BID) | ORAL | 0 refills | Status: DC

## 2019-10-08 NOTE — Progress Notes (Signed)
[facility] [place], [place] [address]  Internal MEDICINE  Office Visit Note  Patient Name: Jason Hudson  [id number]  [id number]  Date of Service: 10/08/2019  Chief Complaint  Patient presents with  . Foot Pain    right going on since saturday      HPI Pt is here for a sick visit. Pt is here for 3 days of right foot pain.  He reports he woke up and it was hurting. It is painful on the lateral aspect of the foot.  There is no swelling or bruising.  He denies any trauma, or injury. He also has an ingrown toenail of his great toe and second toe on his right foot.  He has removed the ingrown toenail himself.  There is some cellulitis noted to his second toe.  Warm to touch. Bloody drainage from toenail, no purulent drainage is noted at this time.     Current Medication:  Outpatient Encounter Medications as of 10/08/2019  Medication Sig Note  . albuterol (VENTOLIN HFA) 108 (90 Base) MCG/ACT inhaler Inhale 1-2 puffs into the lungs every 6 (six) hours as needed for wheezing or shortness of breath.   . bifidobacterium infantis (ALIGN) capsule Take 1 capsule by mouth daily.   . carvedilol (COREG) 25 MG tablet Take 0.5 tablets (12.5 mg total) by mouth 2 (two) times daily.   . Chlorphen-Phenyleph-ASA (ALKA-SELTZER PLUS COLD PO) Take 1 tablet by mouth daily as needed (congestion).    . Cholecalciferol (VITAMIN D3) 2000 units TABS Take 2,000 Units by mouth at bedtime.   Marland Kitchen diltiazem (TIAZAC) 360 MG 24 hr capsule Take 360 mg by mouth daily.    Marland Kitchen doxazosin (CARDURA) 4 MG tablet Take 1 tablet (4 mg total) by mouth at bedtime.   . Dulaglutide (TRULICITY) 1.5 OB/0.9GG SOPN Inject 1.5 mg into the skin every Wednesday.   . fluticasone (FLONASE) 50 MCG/ACT nasal spray Place 2 sprays into both nostrils 2 (two) times daily.    . Fluticasone-Umeclidin-Vilant (TRELEGY ELLIPTA) 100-62.5-25 MCG/INH AEPB Inhale 1 puff into the lungs daily.   Marland Kitchen glipiZIDE (GLUCOTROL XL) 10  MG 24 hr tablet Take 1 tablet (10 mg total) by mouth daily.   . Insulin Degludec (TRESIBA FLEXTOUCH) 200 UNIT/ML SOPN    . ipratropium (ATROVENT) 0.02 % nebulizer solution Take 0.5 mg by nebulization every 6 (six) hours as needed for wheezing or shortness of breath.   . linaclotide (LINZESS) 145 MCG CAPS capsule Take 1 capsule (145 mcg total) by mouth daily before breakfast.   . loratadine (CLARITIN) 10 MG tablet Take 10 mg by mouth daily. 08/24/2018: Takes liqui-gels at home  . metolazone (ZAROXOLYN) 5 MG tablet Take 5 mg (1 tablet) by mouth on Mondays, Wednesdays, and Fridays, about 30 minutes before taking your morning dose of furosemide (Lasix).   . multivitamin (ONE-A-DAY MEN'S) TABS tablet Take 1 tablet by mouth daily.   . mupirocin ointment (BACTROBAN) 2 % Apply to affected areas twice daily for 14 days then as needed   . nitroGLYCERIN (NITROSTAT) 0.4 MG SL tablet Place 1 tablet (0.4 mg total) under the tongue every 5 (five) minutes as needed for chest pain.   . potassium chloride (K-DUR) 10 MEQ tablet Take 1 tablet (10 mEq total) by mouth daily.   Marland Kitchen amiodarone (PACERONE) 200 MG tablet Take 1 tablet (200 mg total) by mouth daily for 30 days.   . furosemide (LASIX) 80 MG tablet Take 1 tablet (80 mg total) by mouth  2 (two) times daily for 30 days.   Marland Kitchen sulfamethoxazole-trimethoprim (BACTRIM) 400-80 MG tablet Take 1 tablet by mouth 2 (two) times daily.    No facility-administered encounter medications on file as of 10/08/2019.      Medical History: Past Medical History:  Diagnosis Date  . CHF (congestive heart failure) ([place])   . COPD (chronic obstructive pulmonary disease) ([place])   . Diabetes mellitus without complication ([place])   . Gastroesophageal reflux disease 12/15/2017  . Hypertension   . Melena 11/05/2017  . Pneumonia 07/27/2016     Vital Signs: BP 139/69   Pulse 75   Temp 97.9 F (36.6 C)   Resp 16   Ht 5\' 8"  (1.727 m)   Wt (!) 306 lb (138.8 kg)   SpO2 93%   BMI 46.53  kg/m    Review of Systems  Constitutional: Negative.  Negative for chills, fatigue and unexpected weight change.  HENT: Negative.  Negative for congestion, rhinorrhea, sneezing and sore throat.   Eyes: Negative for redness.  Respiratory: Negative.  Negative for cough, chest tightness and shortness of breath.   Cardiovascular: Negative.  Negative for chest pain and palpitations.  Gastrointestinal: Negative.  Negative for abdominal pain, constipation, diarrhea, nausea and vomiting.  Endocrine: Negative.   Genitourinary: Negative.  Negative for dysuria and frequency.  Musculoskeletal: Negative.  Negative for arthralgias, back pain, joint swelling and neck pain.  Skin: Negative.  Negative for rash.  Allergic/Immunologic: Negative.   Neurological: Negative.  Negative for tremors and numbness.  Hematological: Negative for adenopathy. Does not bruise/bleed easily.  Psychiatric/Behavioral: Negative.  Negative for behavioral problems, sleep disturbance and suicidal ideas. The patient is not nervous/anxious.     Physical Exam Vitals and nursing note reviewed.  Constitutional:      General: He is not in acute distress.    Appearance: He is well-developed. He is not diaphoretic.  HENT:     Head: Normocephalic and atraumatic.     Mouth/Throat:     Pharynx: No oropharyngeal exudate.  Eyes:     Pupils: Pupils are equal, round, and reactive to light.  Neck:     Thyroid: No thyromegaly.     Vascular: No JVD.     Trachea: No tracheal deviation.  Cardiovascular:     Rate and Rhythm: Normal rate and regular rhythm.     Heart sounds: Normal heart sounds. No murmur. No friction rub. No gallop.   Pulmonary:     Effort: Pulmonary effort is normal. No respiratory distress.     Breath sounds: Normal breath sounds. No wheezing or rales.  Chest:     Chest wall: No tenderness.  Abdominal:     Palpations: Abdomen is soft.     Tenderness: There is no abdominal tenderness. There is no guarding.   Musculoskeletal:        General: Normal range of motion.     Cervical back: Normal range of motion and neck supple.     Comments: Right foot tenderness, pain.   Right second toe cellulitis, secondary to ingrown toenail.  Lymphadenopathy:     Cervical: No cervical adenopathy.  Skin:    General: Skin is warm and dry.  Neurological:     Mental Status: He is alert and oriented to person, place, and time.     Cranial Nerves: No cranial nerve deficit.  Psychiatric:        Behavior: Behavior normal.        Thought Content: Thought content normal.  Judgment: Judgment normal.    Assessment/Plan: 1. Cellulitis of toe of right foot Advised patient to take entire course of antibiotics as prescribed with food. Pt should return to clinic in 7-10 days if symptoms fail to improve or new symptoms develop.  - sulfamethoxazole-trimethoprim (BACTRIM) 400-80 MG tablet; Take 1 tablet by mouth 2 (two) times daily.  Dispense: 20 tablet; Refill: 0  2. Pain of right lower extremity Xray is negative for fracture or other abnormalities.  - DG Foot Complete Right; Future  General Counseling: Jason Hudson understanding of the findings of todays visit and agrees with plan of treatment. I have discussed any further diagnostic evaluation that may be needed or ordered today. We also reviewed his medications today. he has been encouraged to call the office with any questions or concerns that should arise related to todays visit.   Orders Placed This Encounter  Procedures  . DG Foot Complete Right    Meds ordered this encounter  Medications  . sulfamethoxazole-trimethoprim (BACTRIM) 400-80 MG tablet    Sig: Take 1 tablet by mouth 2 (two) times daily.    Dispense:  20 tablet    Refill:  0    Time spent: 25 Minutes  This patient was seen by Orson Gear AGNP-C in Collaboration with Dr Lavera Guise as a part of collaborative care agreement.  Kendell Bane AGNP-C Internal Medicine

## 2019-10-24 ENCOUNTER — Other Ambulatory Visit: Payer: Self-pay

## 2019-10-24 DIAGNOSIS — E1159 Type 2 diabetes mellitus with other circulatory complications: Secondary | ICD-10-CM

## 2019-10-24 DIAGNOSIS — I152 Hypertension secondary to endocrine disorders: Secondary | ICD-10-CM

## 2019-10-24 MED ORDER — DOXAZOSIN MESYLATE 4 MG PO TABS: 4.0000 mg | ORAL_TABLET | Freq: Every day | ORAL | 3 refills | Status: DC

## 2019-10-29 ENCOUNTER — Telehealth: Payer: Self-pay

## 2019-10-29 NOTE — Telephone Encounter (Signed)
Confirmed appointment on 10/31/2019 and screened for covid. [facility] 

## 2019-10-31 ENCOUNTER — Encounter: Payer: Self-pay | Admitting: Internal Medicine

## 2019-10-31 ENCOUNTER — Ambulatory Visit (INDEPENDENT_AMBULATORY_CARE_PROVIDER_SITE_OTHER): Payer: Managed Care, Other (non HMO) | Admitting: Internal Medicine

## 2019-10-31 ENCOUNTER — Other Ambulatory Visit: Payer: Self-pay

## 2019-10-31 VITALS — BP 140/67 | HR 89 | Temp 97.8°F | Resp 16 | Ht 68.0 in | Wt 309.0 lb

## 2019-10-31 DIAGNOSIS — G4733 Obstructive sleep apnea (adult) (pediatric): Secondary | ICD-10-CM

## 2019-10-31 DIAGNOSIS — I4891 Unspecified atrial fibrillation: Secondary | ICD-10-CM | POA: Diagnosis not present

## 2019-10-31 DIAGNOSIS — I2510 Atherosclerotic heart disease of native coronary artery without angina pectoris: Secondary | ICD-10-CM

## 2019-10-31 DIAGNOSIS — J449 Chronic obstructive pulmonary disease, unspecified: Secondary | ICD-10-CM

## 2019-10-31 DIAGNOSIS — I2584 Coronary atherosclerosis due to calcified coronary lesion: Secondary | ICD-10-CM

## 2019-10-31 MED ORDER — DULERA 100-5 MCG/ACT IN AERO: 2.0000 | INHALATION_SPRAY | Freq: Two times a day (BID) | RESPIRATORY_TRACT | 4 refills | Status: DC

## 2019-10-31 NOTE — Patient Instructions (Signed)
Asthma, Adult  Asthma is a long-term (chronic) condition in which the airways get tight and narrow. The airways are the breathing passages that lead from the nose and mouth down into the lungs. A person with asthma will have times when symptoms get worse. These are called asthma attacks. They can cause coughing, whistling sounds when you breathe (wheezing), shortness of breath, and chest pain. They can make it hard to breathe. There is no cure for asthma, but medicines and lifestyle changes can help control it. There are many things that can bring on an asthma attack or make asthma symptoms worse (triggers). Common triggers include:  Mold.  Dust.  Cigarette smoke.  Cockroaches.  Things that can cause allergy symptoms (allergens). These include animal skin flakes (dander) and pollen from trees or grass.  Things that pollute the air. These may include household cleaners, wood smoke, smog, or chemical odors.  Cold air, weather changes, and wind.  Crying or laughing hard.  Stress.  Certain medicines or drugs.  Certain foods such as dried fruit, potato chips, and grape juice.  Infections, such as a cold or the flu.  Certain medical conditions or diseases.  Exercise or tiring activities. Asthma may be treated with medicines and by staying away from the things that cause asthma attacks. Types of medicines may include:  Controller medicines. These help prevent asthma symptoms. They are usually taken every day.  Fast-acting reliever or rescue medicines. These quickly relieve asthma symptoms. They are used as needed and provide short-term relief.  Allergy medicines if your attacks are brought on by allergens.  Medicines to help control the body's defense (immune) system. Follow these instructions at home: Avoiding triggers in your home  Change your heating and air conditioning filter often.  Limit your use of fireplaces and wood stoves.  Get rid of pests (such as roaches and  mice) and their droppings.  Throw away plants if you see mold on them.  Clean your floors. Dust regularly. Use cleaning products that do not smell.  Have someone vacuum when you are not home. Use a vacuum cleaner with a HEPA filter if possible.  Replace carpet with wood, tile, or vinyl flooring. Carpet can trap animal skin flakes and dust.  Use allergy-proof pillows, mattress covers, and box spring covers.  Wash bed sheets and blankets every week in hot water. Dry them in a dryer.  Keep your bedroom free of any triggers.  Avoid pets and keep windows closed when things that cause allergy symptoms are in the air.  Use blankets that are made of polyester or cotton.  Clean bathrooms and kitchens with bleach. If possible, have someone repaint the walls in these rooms with mold-resistant paint. Keep out of the rooms that are being cleaned and painted.  Wash your hands often with soap and water. If soap and water are not available, use hand sanitizer.  Do not allow anyone to smoke in your home. General instructions  Take over-the-counter and prescription medicines only as told by your doctor. ? Talk with your doctor if you have questions about how or when to take your medicines. ? Make note if you need to use your medicines more often than usual.  Do not use any products that contain nicotine or tobacco, such as cigarettes and e-cigarettes. If you need help quitting, ask your doctor.  Stay away from secondhand smoke.  Avoid doing things outdoors when allergen counts are high and when air quality is low.  Wear a ski mask   when doing outdoor activities in the winter. The mask should cover your nose and mouth. Exercise indoors on cold days if you can.  Warm up before you exercise. Take time to cool down after exercise.  Use a peak flow meter as told by your doctor. A peak flow meter is a tool that measures how well the lungs are working.  Keep track of the peak flow meter's readings.  Write them down.  Follow your asthma action plan. This is a written plan for taking care of your asthma and treating your attacks.  Make sure you get all the shots (vaccines) that your doctor recommends. Ask your doctor about a flu shot and a pneumonia shot.  Keep all follow-up visits as told by your doctor. This is important. Contact a doctor if:  You have wheezing, shortness of breath, or a cough even while taking medicine to prevent attacks.  The mucus you cough up (sputum) is thicker than usual.  The mucus you cough up changes from clear or white to yellow, green, gray, or bloody.  You have problems from the medicine you are taking, such as: ? A rash. ? Itching. ? Swelling. ? Trouble breathing.  You need reliever medicines more than 2-3 times a week.  Your peak flow reading is still at 50-79% of your personal best after following the action plan for 1 hour.  You have a fever. Get help right away if:  You seem to be worse and are not responding to medicine during an asthma attack.  You are short of breath even at rest.  You get short of breath when doing very little activity.  You have trouble eating, drinking, or talking.  You have chest pain or tightness.  You have a fast heartbeat.  Your lips or fingernails start to turn blue.  You are light-headed or dizzy, or you faint.  Your peak flow is less than 50% of your personal best.  You feel too tired to breathe normally. Summary  Asthma is a long-term (chronic) condition in which the airways get tight and narrow. An asthma attack can make it hard to breathe.  Asthma cannot be cured, but medicines and lifestyle changes can help control it.  Make sure you understand how to avoid triggers and how and when to use your medicines. This information is not intended to replace advice given to you by your health care provider. Make sure you discuss any questions you have with your health care provider. Document Revised:  10/11/2018 Document Reviewed: 09/12/2016 Elsevier Patient Education  2020 Elsevier Inc.  

## 2019-10-31 NOTE — Progress Notes (Signed)
[facility] [place], [place] [address]  Pulmonary Sleep Medicine   Office Visit Note  Patient Name: Jason Hudson DOB: 07/04/1951 MRN [id number]  Date of Service: 10/31/2019  Complaints/HPI: Patient is here for evaluation of multiple medical issues.  Patient has a history of asthma and does still have some issues with wheezing and cough on occasion.  He does not have any recent admissions to the intensive care unit.  He states he does make some sputum on occasion.  Obstructive sleep apnea is another issue apparently has significant abnormalities.  No recent sleep study has been done.  Patient also does have severe morbid obesity and needs to work on weight loss.  In addition he does have a history of atrial fibrillation which is being followed by his primary.  Currently denies having any chest pain no palpitations.  Patient denies having any nausea vomiting no diarrhea.  No fevers or chills are noted.  ROS  General: (-) fever, (-) chills, (-) night sweats, (-) weakness Skin: (-) rashes, (-) itching,. Eyes: (-) visual changes, (-) redness, (-) itching. Nose and Sinuses: (-) nasal stuffiness or itchiness, (-) postnasal drip, (-) nosebleeds, (-) sinus trouble. Mouth and Throat: (-) sore throat, (-) hoarseness. Neck: (-) swollen glands, (-) enlarged thyroid, (-) neck pain. Respiratory: + cough, (-) bloody sputum, + shortness of breath, - wheezing. Cardiovascular: - ankle swelling, (-) chest pain. Lymphatic: (-) lymph node enlargement. Neurologic: (-) numbness, (-) tingling. Psychiatric: (-) anxiety, (-) depression   Current Medication: Outpatient Encounter Medications as of 10/31/2019  Medication Sig Note  . albuterol (VENTOLIN HFA) 108 (90 Base) MCG/ACT inhaler Inhale 1-2 puffs into the lungs every 6 (six) hours as needed for wheezing or shortness of breath.   . bifidobacterium infantis (ALIGN) capsule Take 1 capsule by mouth daily.   . carvedilol  (COREG) 25 MG tablet Take 0.5 tablets (12.5 mg total) by mouth 2 (two) times daily.   . Chlorphen-Phenyleph-ASA (ALKA-SELTZER PLUS COLD PO) Take 1 tablet by mouth daily as needed (congestion).    . Cholecalciferol (VITAMIN D3) 2000 units TABS Take 2,000 Units by mouth at bedtime.   Marland Kitchen diltiazem (TIAZAC) 360 MG 24 hr capsule Take 360 mg by mouth daily.    Marland Kitchen doxazosin (CARDURA) 4 MG tablet Take 1 tablet (4 mg total) by mouth at bedtime.   . Dulaglutide (TRULICITY) 1.5 RK/2.7CW SOPN Inject 1.5 mg into the skin every Wednesday.   . fluticasone (FLONASE) 50 MCG/ACT nasal spray Place 2 sprays into both nostrils 2 (two) times daily.    . Fluticasone-Umeclidin-Vilant (TRELEGY ELLIPTA) 100-62.5-25 MCG/INH AEPB Inhale 1 puff into the lungs daily.   Marland Kitchen glipiZIDE (GLUCOTROL XL) 10 MG 24 hr tablet Take 1 tablet (10 mg total) by mouth daily.   . Insulin Degludec (TRESIBA FLEXTOUCH) 200 UNIT/ML SOPN    . ipratropium (ATROVENT) 0.02 % nebulizer solution Take 0.5 mg by nebulization every 6 (six) hours as needed for wheezing or shortness of breath.   . linaclotide (LINZESS) 145 MCG CAPS capsule Take 1 capsule (145 mcg total) by mouth daily before breakfast.   . loratadine (CLARITIN) 10 MG tablet Take 10 mg by mouth daily. 08/24/2018: Takes liqui-gels at home  . metolazone (ZAROXOLYN) 5 MG tablet Take 5 mg (1 tablet) by mouth on Mondays, Wednesdays, and Fridays, about 30 minutes before taking your morning dose of furosemide (Lasix).   . multivitamin (ONE-A-DAY MEN'S) TABS tablet Take 1 tablet by mouth daily.   . mupirocin ointment (  BACTROBAN) 2 % Apply to affected areas twice daily for 14 days then as needed   . nitroGLYCERIN (NITROSTAT) 0.4 MG SL tablet Place 1 tablet (0.4 mg total) under the tongue every 5 (five) minutes as needed for chest pain.   . potassium chloride (K-DUR) 10 MEQ tablet Take 1 tablet (10 mEq total) by mouth daily.   Marland Kitchen sulfamethoxazole-trimethoprim (BACTRIM) 400-80 MG tablet Take 1 tablet by mouth 2  (two) times daily.   Marland Kitchen amiodarone (PACERONE) 200 MG tablet Take 1 tablet (200 mg total) by mouth daily for 30 days.   . furosemide (LASIX) 80 MG tablet Take 1 tablet (80 mg total) by mouth 2 (two) times daily for 30 days.    No facility-administered encounter medications on file as of 10/31/2019.    Surgical History: Past Surgical History:  Procedure Laterality Date  . COLONOSCOPY WITH PROPOFOL N/A 11/07/2017   Procedure: COLONOSCOPY WITH PROPOFOL;  Surgeon: [name], MD;  Location: [facility] ENDOSCOPY;  Service: Gastroenterology;  Laterality: N/A;  . ESOPHAGOGASTRODUODENOSCOPY (EGD) WITH PROPOFOL N/A 11/07/2017   Procedure: ESOPHAGOGASTRODUODENOSCOPY (EGD) WITH PROPOFOL;  Surgeon: [name], MD;  Location: [facility] ENDOSCOPY;  Service: Gastroenterology;  Laterality: N/A;  . LEFT HEART CATH AND CORONARY ANGIOGRAPHY N/A 10/23/2017   Procedure: LEFT HEART CATH AND CORONARY ANGIOGRAPHY;  Surgeon: Isaias Cowman, MD;  Location: [place] CV LAB;  Service: Cardiovascular;  Laterality: N/A;    Medical History: Past Medical History:  Diagnosis Date  . CHF (congestive heart failure) ([place])   . COPD (chronic obstructive pulmonary disease) ([place])   . Diabetes mellitus without complication ([place])   . Gastroesophageal reflux disease 12/15/2017  . Hypertension   . Melena 11/05/2017  . Pneumonia 07/27/2016    Family History: Family History  Problem Relation Age of Onset  . Cancer Mother   . CVA Father   . Heart disease Father     Social History: Social History   Socioeconomic History  . Marital status: Single    Spouse name: Not on file  . Number of children: Not on file  . Years of education: Not on file  . Highest education level: Not on file  Occupational History  . Not on file  Tobacco Use  . Smoking status: Never Smoker  . Smokeless tobacco: Never Used  Substance and Sexual Activity  . Alcohol use: Yes    Comment: ocassionally  . Drug use: Never  . Sexual activity: Not on  file  Other Topics Concern  . Not on file  Social History Narrative  . Not on file   Social Determinants of Health   Financial Resource Strain:   . Difficulty of Paying Living Expenses:   Food Insecurity:   . Worried About Charity fundraiser in the Last Year:   . Arboriculturist in the Last Year:   Transportation Needs:   . Film/video editor (Medical):   Marland Kitchen Lack of Transportation (Non-Medical):   Physical Activity:   . Days of Exercise per Week:   . Minutes of Exercise per Session:   Stress:   . Feeling of Stress :   Social Connections:   . Frequency of Communication with Friends and Family:   . Frequency of Social Gatherings with Friends and Family:   . Attends Religious Services:   . Active Member of Clubs or Organizations:   . Attends Archivist Meetings:   Marland Kitchen Marital Status:   Intimate Partner Violence:   . Fear of Current or Ex-Partner:   .  Emotionally Abused:   Marland Kitchen Physically Abused:   . Sexually Abused:     Vital Signs: Blood pressure 140/67, pulse 89, temperature 97.8 F (36.6 C), resp. rate 16, height 5\' 8"  (1.727 m), weight (!) 309 lb (140.2 kg), SpO2 94 %.  Examination: General Appearance: The patient is well-developed, well-nourished, and in no distress. Skin: Gross inspection of skin unremarkable. Head: normocephalic, no gross deformities. Eyes: no gross deformities noted. ENT: ears appear grossly normal no exudates. Neck: Supple. No thyromegaly. No LAD. Respiratory: Coarse breath sounds but no rhonchi or rales are noted. Cardiovascular: Normal S1 and S2 without murmur or rub. Extremities: No cyanosis. pulses are equal. Neurologic: Alert and oriented. No involuntary movements.  LABS: Recent Results (from the past 2160 hour(s))  UA/M w/rflx Culture, Routine     Status: Abnormal   Collection Time: 08/27/19  1:51 PM   Specimen: Urine   URINE  Result Value Ref Range   Specific Gravity, UA 1.010 1.005 - 1.030   pH, UA 8.0 (H) 5.0 - 7.5    Color, UA Yellow Yellow   Appearance Ur Clear Clear   Leukocytes,UA Negative Negative   Protein,UA Negative Negative/Trace   Glucose, UA Negative Negative   Ketones, UA Negative Negative   RBC, UA Negative Negative   Bilirubin, UA Negative Negative   Urobilinogen, Ur 0.2 0.2 - 1.0 mg/dL   Nitrite, UA Negative Negative   Microscopic Examination Comment     Comment: Microscopic follows if indicated.   Microscopic Examination See below:     Comment: Microscopic was indicated and was performed.   Urinalysis Reflex Comment     Comment: This specimen will not reflex to a Urine Culture.  Microscopic Examination     Status: Abnormal   Collection Time: 08/27/19  1:51 PM   URINE  Result Value Ref Range   WBC, UA 0-5 0 - 5 /hpf   RBC 0-2 0 - 2 /hpf   Epithelial Cells (non renal) 0-10 0 - 10 /hpf   Casts Present (A) None seen /lpf   Cast Type Hyaline casts N/A   Mucus, UA Present Not Estab.   Bacteria, UA None seen None seen/Few  POCT HgB A1C     Status: Abnormal   Collection Time: 08/27/19  2:12 PM  Result Value Ref Range   Hemoglobin A1C 7.5 (A) 4.0 - 5.6 %   HbA1c POC (<> result, manual entry)     HbA1c, POC (prediabetic range)     HbA1c, POC (controlled diabetic range)    Brain natriuretic peptide     Status: None   Collection Time: 09/05/19 11:18 AM  Result Value Ref Range   B Natriuretic Peptide 27.0 0.0 - 100.0 pg/mL    Comment: Performed at [facility], [address] [place], [place] [address]    Radiology: DG Foot Complete Right  Result Date: 10/08/2019 CLINICAL DATA:  Right foot pain common no known injury, initial encounter EXAM: RIGHT FOOT COMPLETE - 3+ VIEW COMPARISON:  None. FINDINGS: There is no evidence of fracture or dislocation. There is no evidence of arthropathy or other focal bone abnormality. Soft tissues are unremarkable. IMPRESSION: No acute abnormality noted. Electronically Signed   By: Inez Catalina M.D.   On: 10/08/2019 13:59    No results  found.  DG Foot Complete Right  Result Date: 10/08/2019 CLINICAL DATA:  Right foot pain common no known injury, initial encounter EXAM: RIGHT FOOT COMPLETE - 3+ VIEW COMPARISON:  None. FINDINGS: There is no  evidence of fracture or dislocation. There is no evidence of arthropathy or other focal bone abnormality. Soft tissues are unremarkable. IMPRESSION: No acute abnormality noted. Electronically Signed   By: Inez Catalina M.D.   On: 10/08/2019 13:59      Assessment and Plan: Patient Active Problem List   Diagnosis Date Noted  . Encounter for general adult medical examination with abnormal findings 08/31/2019  . Flu vaccine need 08/31/2019  . Need for vaccination against Streptococcus pneumoniae using pneumococcal conjugate vaccine 13 08/31/2019  . Dysuria 08/31/2019  . Irritable bowel syndrome with constipation 05/29/2019  . Type 2 diabetes mellitus with hyperglycemia ([place]) 03/17/2019  . Lower extremity edema 12/08/2018  . Diabetic ulcer of calf ([place]) 11/10/2018  . Restrictive lung disease 10/26/2018  . Acute exacerbation of CHF (congestive heart failure) ([place]) 09/12/2018  . Acute on chronic systolic congestive heart failure ([place]) 09/06/2018  . AKI (acute kidney injury) ([place]) 09/06/2018  . Moderate intermittent asthma without complication 85/46/2703  . Coronary arteriosclerosis 08/30/2018  . Diastolic dysfunction 50/04/3817  . Hypercholesterolemia 08/30/2018  . Obesity 08/30/2018  . COPD exacerbation ([place]) 08/24/2018  . Hyperlipidemia due to type 2 diabetes mellitus ([place]) 03/28/2018  . Type 2 diabetes mellitus with chronic kidney disease, with long-term current use of insulin ([place]) 03/28/2018  . Essential hypertension 01/19/2018  . Chronic obstructive lung disease ([place]) 12/15/2017  . Gastroesophageal reflux disease 12/15/2017  . Obstructive sleep apnea of adult 12/15/2017  . Melena 11/05/2017  . Congestive heart failure ([place]) 10/20/2017  . Pneumonia 07/27/2016     1. COPD/Asthma I would recommend getting a follow-up pulmonary function on him to assess severity of his COPD/asthma.  Reviewed medications also.  May need to have some adjustments made but will discuss this further after the next visit. 2. OSA as far as his sleep is concerned recommended getting a follow-up sleep study.  He will be probably a good candidate for home study once this is done then we will make an assessment about is PAP therapy.  Also spoke with him at length about weight loss through diet and exercise. 3. CAD primary prevention by his primary care physician we will continue with supportive care 4. A fib rate is controlled at this time we will continue with supportive care 5. Morbid obesity at baseline diet exercise discussed at length  General Counseling: I have discussed the findings of the evaluation and examination with Elenore Rota.  I have also discussed any further diagnostic evaluation thatmay be needed or ordered today. Alessandro verbalizes understanding of the findings of todays visit. We also reviewed his medications today and discussed drug interactions and side effects including but not limited excessive drowsiness and altered mental states. We also discussed that there is always a risk not just to him but also people around him. he has been encouraged to call the office with any questions or concerns that should arise related to todays visit.  Orders Placed This Encounter  Procedures  . Home sleep test    Order Specific Question:   Where should this test be performed:    Answer:   [facility]     Time spent: 69  I have personally obtained a history, examined the patient, evaluated laboratory and imaging results, formulated the assessment and plan and placed orders.    Allyne Gee, MD [facility] Pulmonary and Critical Care Sleep medicine

## 2019-11-01 ENCOUNTER — Other Ambulatory Visit: Payer: Self-pay

## 2019-11-01 MED ORDER — BUDESONIDE-FORMOTEROL FUMARATE 160-4.5 MCG/ACT IN AERO: 2.0000 | INHALATION_SPRAY | Freq: Two times a day (BID) | RESPIRATORY_TRACT | 3 refills | Status: DC

## 2019-11-01 NOTE — Telephone Encounter (Signed)
Pt called to notify us that dulera is no longer covered by insurance. [employer] called to give [place] a list that is covered and they cover symbicort, advair, and breo. Spoke with adam and d/c dulera and sending in symbicort.

## 2019-11-06 ENCOUNTER — Other Ambulatory Visit: Payer: Self-pay

## 2019-11-06 MED ORDER — BUDESONIDE-FORMOTEROL FUMARATE 160-4.5 MCG/ACT IN AERO: 2.0000 | INHALATION_SPRAY | Freq: Two times a day (BID) | RESPIRATORY_TRACT | 3 refills | Status: DC

## 2019-11-13 ENCOUNTER — Other Ambulatory Visit: Payer: Managed Care, Other (non HMO) | Admitting: Internal Medicine

## 2019-11-21 ENCOUNTER — Other Ambulatory Visit: Payer: Self-pay

## 2019-11-21 MED ORDER — POTASSIUM CHLORIDE ER 10 MEQ PO CPCR: 10.0000 meq | ORAL_CAPSULE | Freq: Every day | ORAL | 0 refills | Status: DC

## 2019-11-25 ENCOUNTER — Telehealth: Payer: Self-pay

## 2019-11-25 NOTE — Telephone Encounter (Signed)
Confirmed appointment on 11/27/2019 and screened for covid. klh

## 2019-11-27 ENCOUNTER — Other Ambulatory Visit: Payer: Self-pay

## 2019-11-27 ENCOUNTER — Ambulatory Visit: Payer: Managed Care, Other (non HMO) | Admitting: Internal Medicine

## 2019-11-27 DIAGNOSIS — G4733 Obstructive sleep apnea (adult) (pediatric): Secondary | ICD-10-CM | POA: Diagnosis not present

## 2019-12-06 IMAGING — MR MR MRA HEAD W/O CM
10 of 12 series · 33 of 48 positions shown · non-contrast
Comparison: Head CT 07/02/2018

CLINICAL DATA: Left-sided facial droop.  Symptoms began yesterday.

EXAM:
MRI HEAD WITHOUT CONTRAST
MRA HEAD WITHOUT CONTRAST
TECHNIQUE: Multiplanar, multiecho pulse sequences of the brain and surrounding
structures were obtained without intravenous contrast. Angiographic
images of the head were obtained using MRA technique without
contrast.

[Series 5: ax dwi_tracew · axial · 3.0mm · 0.60mm/px · z∈[-30,+126]mm · 3 of 55 slices shown]
[im 1/55]
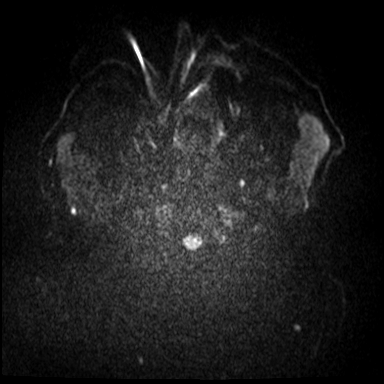
[im 28/55]
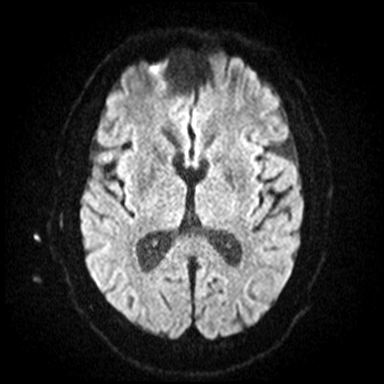
[im 55/55]
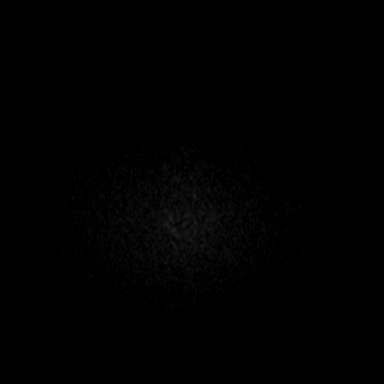

[Series 6: ax dwi_adc · axial · 3.0mm · 0.60mm/px · z∈[-30,+126]mm · 3 of 55 slices shown]
[im 1/55]
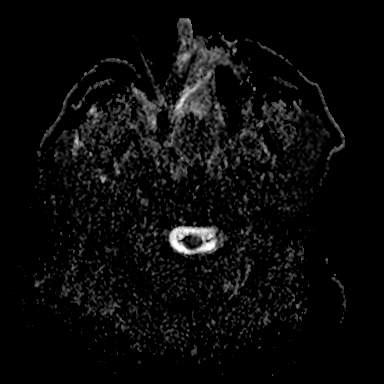
[im 28/55]
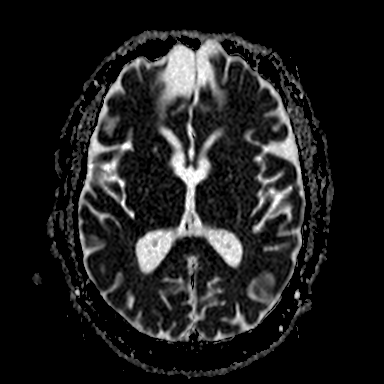
[im 55/55]
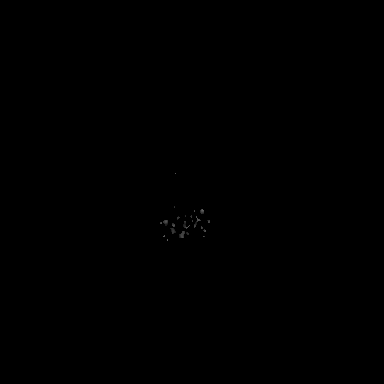

[Series 7: cor dwi_tracew · coronal · 5.0mm · 0.60mm/px · 3 of 45 slices shown]
[im 1/45]
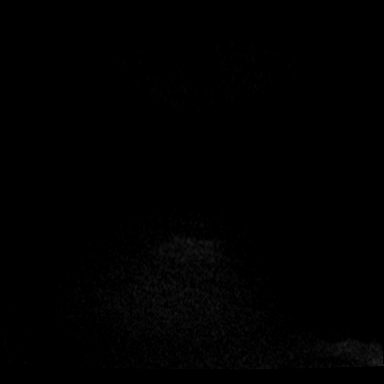
[im 23/45]
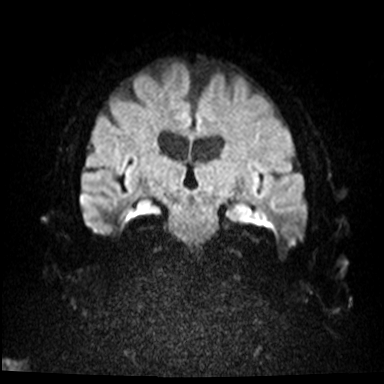
[im 45/45]
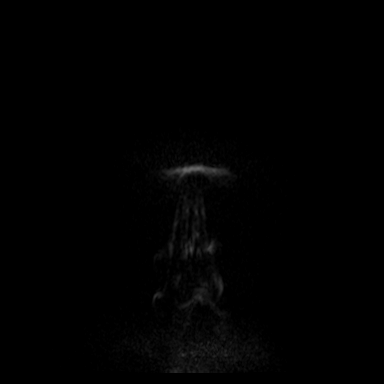

[Series 8: cor dwi_adc · coronal · 5.0mm · 0.60mm/px · 3 of 45 slices shown]
[im 1/45]
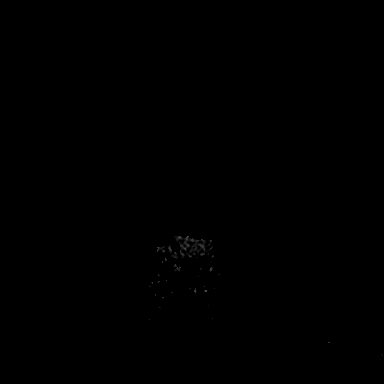
[im 23/45]
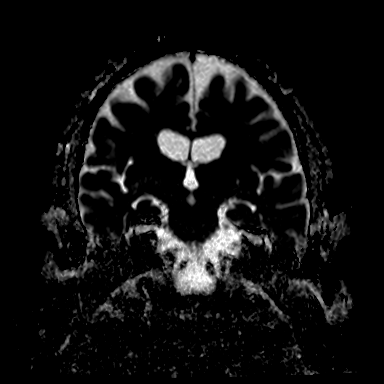
[im 45/45]
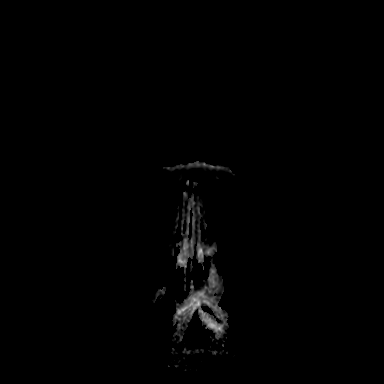

[Series 9: T1 · sagittal · 5.0mm · 0.62mm/px · 1 of 25 slices shown (1 of 2)]
[im 1/25]
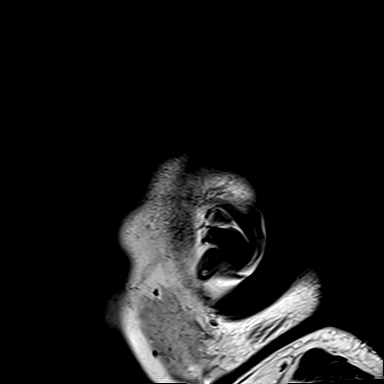

[Series 10: TOF · axial · 0.5mm · 0.41mm/px · z∈[-15,+39]mm · 5 of 205 slices shown]
[im 1/205]
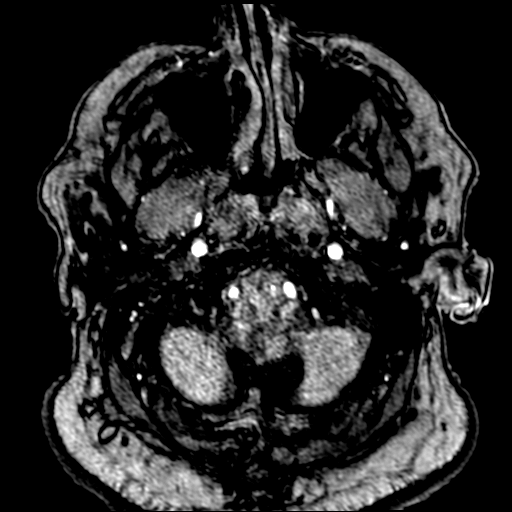
[im 38/205]
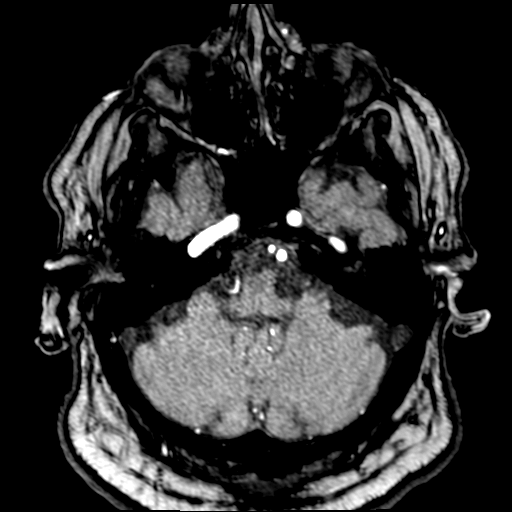
[im 56/205]
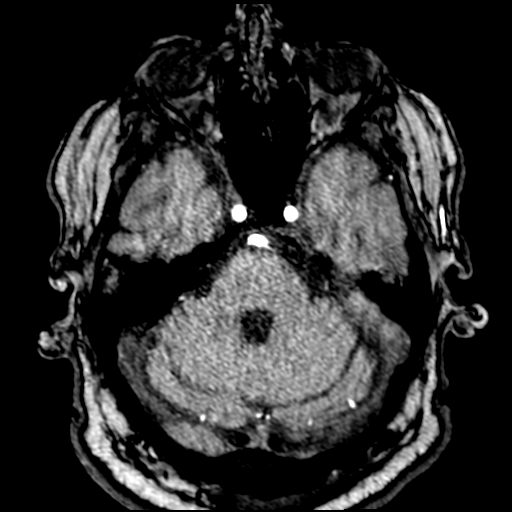
[im 93/205]
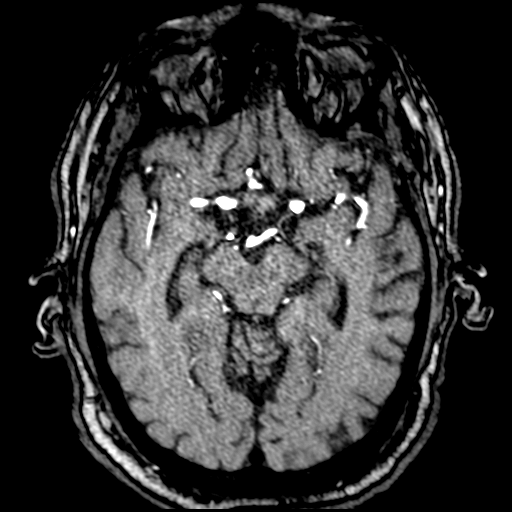
[im 112/205]
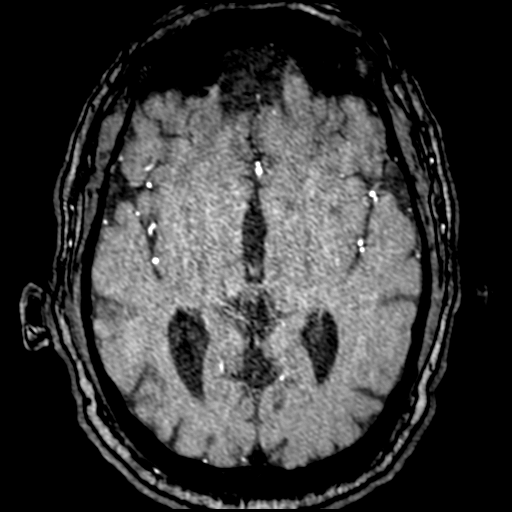

[Series 15: T2 · axial · 5.0mm · 0.53mm/px · z∈[-27,+123]mm · 2 of 27 slices shown (1 of 2)]
[im 1/27]
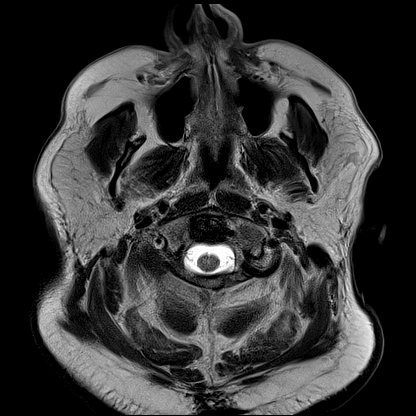
[im 27/27]
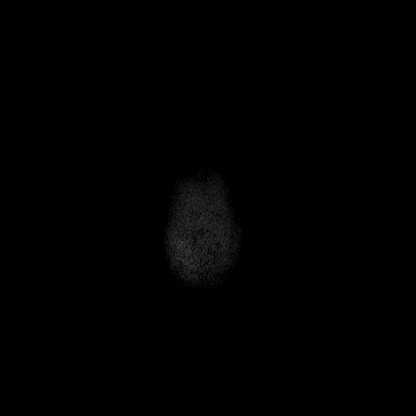

[Series 18: FLAIR · axial · 3.0mm · 0.53mm/px · z∈[-30,+126]mm · 3 of 55 slices shown]
[im 1/55]
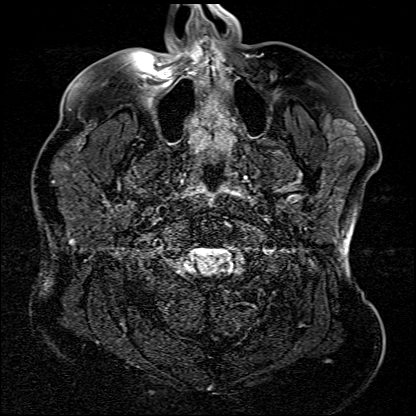
[im 28/55]
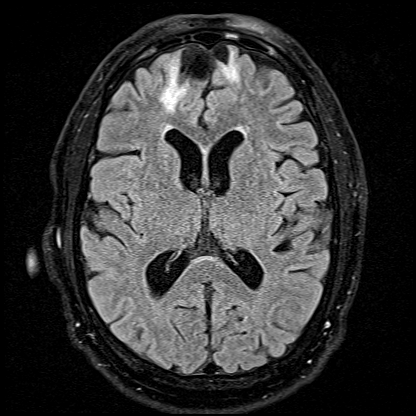
[im 55/55]
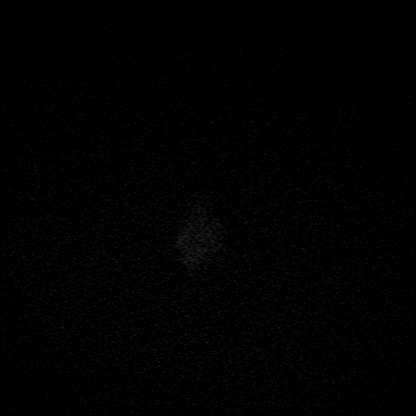

[Series 19: T1 · axial · 1.0mm · 0.98mm/px · z∈[-36,+132]mm · 8 of 175 slices shown (2 of 2)]
[im 1/175]
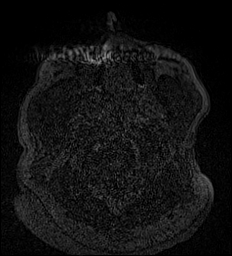
[im 20/175]
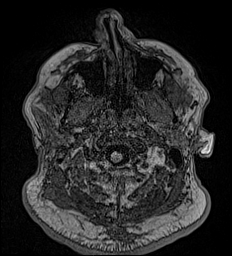
[im 59/175]
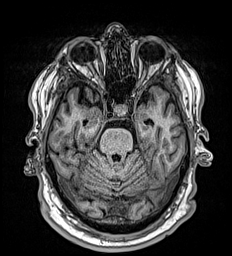
[im 78/175]
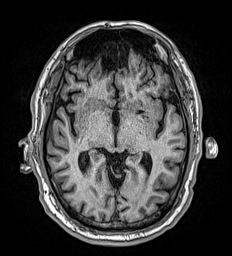
[im 97/175]
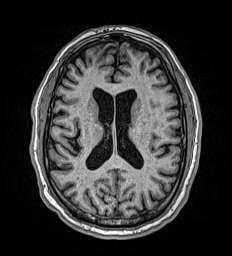
[im 117/175]
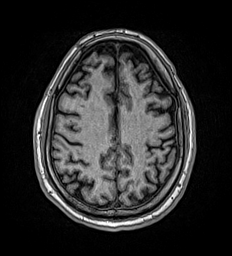
[im 155/175]
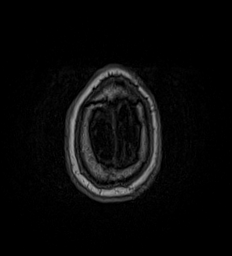
[im 175/175]
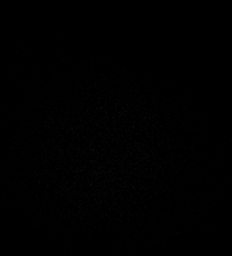

[Series 20: T2 · coronal · 5.0mm · 0.60mm/px · 2 of 31 slices shown (2 of 2)]
[im 1/31]
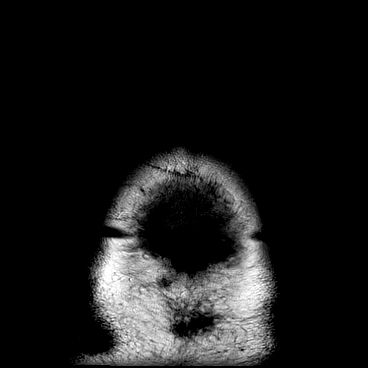
[im 31/31]
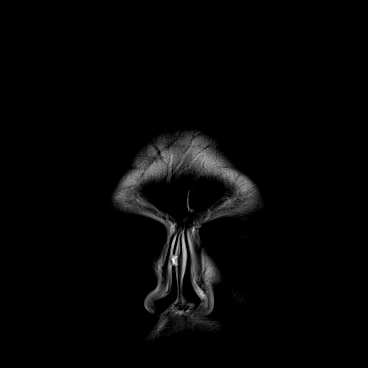

[33 of 48 positions shown; findings below may reference images not displayed]

FINDINGS: MRI HEAD FINDINGS

Brain: Diffusion imaging does not show any acute or subacute
infarction. The brainstem and cerebellum are normal. Cerebral
hemispheres show atrophy and gliosis of both frontal lobes quite
likely secondary to previous closed head injury. Elsewhere within
the cerebral hemispheres, there are a few scattered punctate foci in
the white matter that could relate to old shear injuries or minimal
small vessel ischemic changes. No evidence of primary ischemic
infarction. No mass lesion, recent hemorrhage, hydrocephalus or
extra-axial collection.

Vascular: Major vessels at the base of the brain show flow.

Skull and upper cervical spine: Negative

Sinuses/Orbits: Clear/normal. Small mastoid effusion on the right,
likely subclinical.

Other: None

MRA HEAD FINDINGS

Both internal carotid arteries are widely patent through the skull
base and siphon regions. The anterior and middle cerebral vessels
are patent. More distal branch vessels do show some atherosclerotic
irregularity.

Both vertebral arteries are patent to the basilar. No basilar
stenosis. Posterior circulation branch vessels are patent. There are
multiple stenoses within the PCA branches on each side.
IMPRESSION: No acute brain finding. Atrophy, encephalomalacia and gliosis in the
frontal lobes quite likely secondary to old closed head injury. No
evidence of recent ischemic insult.

No large or medium vessel occlusion. Distal vessel atherosclerotic
irregularity most pronounced within the posterior cerebral artery
branches but also affecting the anterior and middle cerebral artery
branches.

## 2019-12-15 NOTE — Procedures (Signed)
[facility] [place], [place] [address]  Sleep Specialist: Allyne Gee, MD [place] [facility] Study Interpretation  Patient Name: Selvin Yun Patient MR KCLEXN:[id number] DOB:1951/04/18  Date of Study: November 27, 2018 with  Indications for study: Hypersomnia sleep apnea  BMI: 46.5 kg/m       Respiratory Data:  Total AHI: 28.0/h  Total Obstructive Apneas: 43  Total Central Apneas: 1  Total Mixed Apneas: 0  Total Hypopneas: 161  If the AHI is greater than 5 per hour patient qualifies for PAP evaluation  Oximetry Data:  Oxygen Desaturation Index: 35.8/h  Lowest Desaturation: 72%  Cardiac Data:  Minimum Heart Rate: 42  Maximum Heart Rate: 75   Impression / Diagnosis:  This apnea study is consistent with moderate obstructive sleep apnea with an AHI of 28.0/h patient also had significant oxygen desaturations severe down to 72%.  CPAP titration study is indicated to determine the optimal response to therapy.  GENERAL Recommendations:  1.  Consider Auto PAP with pressure ranges 5-20 cmH20 with download, or facility based PAP Titration Study  2.  Consider PAP interface mask fitted for patient comfort, Heated Humidification & PAP compliance monitoring (1 month, 3 months & 12 months after PAP initiation)  3. Consider treatment with mandibular advancement splint (MAS) or referral to an ENT surgeon for modification to the upper airway if the patient prefers an alternate therapy or the PAP trial is unsuccessful  4. Sleep hygiene measures should be discussed with the patient  5. Behavioral therapy such as weight reduction or smoking cessation as appropriate for the patient  6. Advise patient against the use of alcohol or sedatives in so much as these substances can worsen excessive daytime sleepiness and respiratory disturbances of sleep  7. Advise patient against participating in potentially dangerous activities while drowsy such  as operating a motor vehicle, heavy equipment or power tools as it can put them and others in danger  8. Advise patient of the long term consequences of OSA if left untreated, need for treatment and close follow up  9. Clinical follow up as deemed necessary     This Level III home sleep study was performed using the [employer], a 4 channel screening device subject to limitations. Depending on actual total sleep time, not measured in this study, the AHI (sum of apneas and hypopneas/hr of sleep) and therefore the severity of sleep apnea may be underestimated. As with any single night study, including Level 1 attended PSG, severity of sleep apnea may also be underestimated due to the lack of supine and/or REM sleep.  The interpretation associated with this report is based on normal values and degrees of severity in accordance with AASM parameters and/or estimated from multiple sources in the literature for adults ages 68-80+. These may not agree with the displayed values. The patient's treating physician should use the interpretation and recommendations in conjunction with the overall clinical evaluation and treatment of the patient.  Some of the terminology used in this scored [facility] report was developed several years ago and may not always be in accordance with current nomenclature. This in no way affects the accuracy of the data or the reliability of the interpretation and recommendations.

## 2019-12-24 ENCOUNTER — Telehealth: Payer: Self-pay

## 2019-12-24 NOTE — Telephone Encounter (Signed)
Confirmed appointment on 12/26/2019 and screened for covid. klh 

## 2019-12-26 ENCOUNTER — Ambulatory Visit: Payer: Managed Care, Other (non HMO) | Admitting: Adult Health

## 2019-12-27 ENCOUNTER — Telehealth: Payer: Self-pay

## 2019-12-27 NOTE — Telephone Encounter (Signed)
BILLED MISSED APPOINTMENT FEE FOR 12/26/19

## 2020-01-10 ENCOUNTER — Telehealth: Payer: Self-pay

## 2020-01-10 NOTE — Telephone Encounter (Signed)
Confirmed appointment on 01/14/2020 and screened for covid. klh 

## 2020-01-14 ENCOUNTER — Ambulatory Visit: Payer: Managed Care, Other (non HMO) | Admitting: Adult Health

## 2020-02-04 ENCOUNTER — Telehealth: Payer: Self-pay

## 2020-02-04 NOTE — Telephone Encounter (Signed)
Patient rescheduled appointment on 02/06/2020 to 02/13/2020. klh

## 2020-02-06 ENCOUNTER — Ambulatory Visit: Payer: Managed Care, Other (non HMO) | Admitting: Internal Medicine

## 2020-02-11 ENCOUNTER — Telehealth: Payer: Self-pay

## 2020-02-11 NOTE — Telephone Encounter (Signed)
Called lmom informing patient of appointment on 02/13/2020. klh

## 2020-02-12 ENCOUNTER — Telehealth: Payer: Self-pay

## 2020-02-12 NOTE — Telephone Encounter (Signed)
Confirmed appointment on 02/13/2020 and screened for covid. klh 

## 2020-02-13 ENCOUNTER — Ambulatory Visit
Admission: RE | Admit: 2020-02-13 | Discharge: 2020-02-13 | Disposition: A | Discharge status: Home / Self Care | Payer: Managed Care, Other (non HMO) | Attending: Nurse Practitioner | Admitting: Nurse Practitioner

## 2020-02-13 ENCOUNTER — Ambulatory Visit
Admission: RE | Admit: 2020-02-13 | Discharge: 2020-02-13 | Disposition: A | Discharge status: Home / Self Care | Payer: Managed Care, Other (non HMO) | Source: Ambulatory Visit | Attending: Nurse Practitioner | Admitting: Nurse Practitioner

## 2020-02-13 ENCOUNTER — Other Ambulatory Visit: Payer: Self-pay

## 2020-02-13 ENCOUNTER — Encounter: Payer: Self-pay | Admitting: Nurse Practitioner

## 2020-02-13 ENCOUNTER — Other Ambulatory Visit: Payer: Self-pay | Admitting: Nurse Practitioner

## 2020-02-13 ENCOUNTER — Ambulatory Visit (INDEPENDENT_AMBULATORY_CARE_PROVIDER_SITE_OTHER): Payer: Managed Care, Other (non HMO) | Admitting: Nurse Practitioner

## 2020-02-13 ENCOUNTER — Ambulatory Visit: Payer: Managed Care, Other (non HMO) | Admitting: Internal Medicine

## 2020-02-13 VITALS — BP 96/37 | HR 64 | Temp 97.4°F | Resp 16 | Ht 68.0 in

## 2020-02-13 DIAGNOSIS — M5442 Lumbago with sciatica, left side: Secondary | ICD-10-CM

## 2020-02-13 DIAGNOSIS — M5441 Lumbago with sciatica, right side: Secondary | ICD-10-CM | POA: Insufficient documentation

## 2020-02-13 DIAGNOSIS — M25551 Pain in right hip: Secondary | ICD-10-CM | POA: Insufficient documentation

## 2020-02-13 MED ORDER — METHYLPREDNISOLONE 4 MG PO TBPK: ORAL_TABLET | ORAL | 0 refills | Status: DC

## 2020-02-13 MED ORDER — CYCLOBENZAPRINE HCL 10 MG PO TABS: 10.0000 mg | ORAL_TABLET | Freq: Three times a day (TID) | ORAL | 0 refills | Status: DC | PRN

## 2020-02-13 MED ORDER — LIDOCAINE 5 % EX PTCH: 1.0000 | MEDICATED_PATCH | CUTANEOUS | 0 refills | Status: DC

## 2020-02-13 MED ORDER — ETODOLAC 400 MG PO TABS: 400.0000 mg | ORAL_TABLET | Freq: Two times a day (BID) | ORAL | 1 refills | Status: DC

## 2020-02-13 NOTE — Progress Notes (Signed)
[facility] [place], [place] [address]  Internal MEDICINE  Office Visit Note  Patient Name: Jason Hudson  [id number]  [id number]  Date of Service: 02/19/2020   Pt is here for a sick visit.   Chief Complaint  Patient presents with   Hip Pain    Having trouble walking   Quality Metric Gaps    PNA vaccine; opthamology exam     The patient presents for acute visit. Today, he is complaining of severe lower back pain which radiates into both hips and lower legs. States that pain is getting so severe that he can hardly stand and walk. He is using a wheelchair in the office for mobility. He states that bending or twisting at the waist also cause increased pain. He does not remember any specific injury or action which initiated this pain. It has been present for several days and it is gradually getting worse.       Current Medication:  Outpatient Encounter Medications as of 02/13/2020  Medication Sig Note   albuterol (VENTOLIN HFA) 108 (90 Base) MCG/ACT inhaler Inhale 1-2 puffs into the lungs every 6 (six) hours as needed for wheezing or shortness of breath.    bifidobacterium infantis (ALIGN) capsule Take 1 capsule by mouth daily.    budesonide-formoterol (SYMBICORT) 160-4.5 MCG/ACT inhaler Inhale 2 puffs into the lungs 2 (two) times daily.    carvedilol (COREG) 25 MG tablet Take 0.5 tablets (12.5 mg total) by mouth 2 (two) times daily.    Chlorphen-Phenyleph-ASA (ALKA-SELTZER PLUS COLD PO) Take 1 tablet by mouth daily as needed (congestion).     Cholecalciferol (VITAMIN D3) 2000 units TABS Take 2,000 Units by mouth at bedtime.    diltiazem (TIAZAC) 360 MG 24 hr capsule Take 360 mg by mouth daily.     doxazosin (CARDURA) 4 MG tablet Take 1 tablet (4 mg total) by mouth at bedtime.    Dulaglutide (TRULICITY) 1.5 QI/6.9GE SOPN Inject 1.5 mg into the skin every Wednesday.    fluticasone (FLONASE) 50 MCG/ACT nasal spray Place 2 sprays into  both nostrils 2 (two) times daily.     Fluticasone-Umeclidin-Vilant (TRELEGY ELLIPTA) 100-62.5-25 MCG/INH AEPB Inhale 1 puff into the lungs daily.    glipiZIDE (GLUCOTROL XL) 10 MG 24 hr tablet Take 1 tablet (10 mg total) by mouth daily.    Insulin Degludec (TRESIBA FLEXTOUCH) 200 UNIT/ML SOPN     ipratropium (ATROVENT) 0.02 % nebulizer solution Take 0.5 mg by nebulization every 6 (six) hours as needed for wheezing or shortness of breath.    linaclotide (LINZESS) 145 MCG CAPS capsule Take 1 capsule (145 mcg total) by mouth daily before breakfast.    loratadine (CLARITIN) 10 MG tablet Take 10 mg by mouth daily. 08/24/2018: Takes liqui-gels at home   metolazone (ZAROXOLYN) 5 MG tablet Take 5 mg (1 tablet) by mouth on Mondays, Wednesdays, and Fridays, about 30 minutes before taking your morning dose of furosemide (Lasix).    multivitamin (ONE-A-DAY MEN'S) TABS tablet Take 1 tablet by mouth daily.    mupirocin ointment (BACTROBAN) 2 % Apply to affected areas twice daily for 14 days then as needed    nitroGLYCERIN (NITROSTAT) 0.4 MG SL tablet Place 1 tablet (0.4 mg total) under the tongue every 5 (five) minutes as needed for chest pain.    sulfamethoxazole-trimethoprim (BACTRIM) 400-80 MG tablet Take 1 tablet by mouth 2 (two) times daily.    [DISCONTINUED] potassium chloride (MICRO-K) 10 MEQ CR capsule Take 1 capsule (10  mEq total) by mouth daily.    amiodarone (PACERONE) 200 MG tablet Take 1 tablet (200 mg total) by mouth daily for 30 days.    cyclobenzaprine (FLEXERIL) 10 MG tablet Take 1 tablet (10 mg total) by mouth 3 (three) times daily as needed for muscle spasms.    etodolac (LODINE) 400 MG tablet Take 1 tablet (400 mg total) by mouth 2 (two) times daily.    furosemide (LASIX) 80 MG tablet Take 1 tablet (80 mg total) by mouth 2 (two) times daily for 30 days.    lidocaine (LIDODERM) 5 % Place 1 patch onto the skin daily. Remove & Discard patch within 12 hours or as directed by MD     methylPREDNISolone (MEDROL) 4 MG TBPK tablet Take by mouth as directed for 6 days    No facility-administered encounter medications on file as of 02/13/2020.      Medical History: Past Medical History:  Diagnosis Date   CHF (congestive heart failure) (HCC)    COPD (chronic obstructive pulmonary disease) (HCC)    Diabetes mellitus without complication ([place])    Gastroesophageal reflux disease 12/15/2017   Hypertension    Melena 11/05/2017   Pneumonia 07/27/2016     Today's Vitals   02/13/20 1424  BP: (!) 96/37  Pulse: 64  Resp: 16  Temp: (!) 97.4 F (36.3 C)  SpO2: 95%  Height: 5\' 8"  (1.727 m)   Body mass index is 46.98 kg/m.  Review of Systems  Constitutional: Positive for activity change and fatigue. Negative for chills and unexpected weight change.  HENT: Negative for congestion, postnasal drip, rhinorrhea, sneezing and sore throat.   Respiratory: Negative for cough, chest tightness, shortness of breath and wheezing.   Cardiovascular: Negative for chest pain and palpitations.  Gastrointestinal: Negative for abdominal pain, constipation, diarrhea, nausea and vomiting.  Genitourinary: Negative for dysuria, flank pain, frequency and urgency.  Musculoskeletal: Positive for arthralgias, back pain, gait problem and myalgias. Negative for joint swelling and neck pain.       Severe lower back pain which is radiating into both hips and upper parts of the legs. Pain is gradually getting worse. Getting difficult for him to stand up or walk.   Skin: Negative for rash.  Neurological: Positive for weakness. Negative for tremors and numbness.  Hematological: Negative for adenopathy. Does not bruise/bleed easily.  Psychiatric/Behavioral: Negative for behavioral problems (Depression), sleep disturbance and suicidal ideas. The patient is nervous/anxious.     Physical Exam Vitals and nursing note reviewed.  Constitutional:      General: He is not in acute distress.     Appearance: Normal appearance. He is well-developed. He is not diaphoretic.  HENT:     Head: Normocephalic and atraumatic.     Mouth/Throat:     Pharynx: No oropharyngeal exudate.  Eyes:     Pupils: Pupils are equal, round, and reactive to light.  Neck:     Thyroid: No thyromegaly.     Vascular: No JVD.     Trachea: No tracheal deviation.  Cardiovascular:     Rate and Rhythm: Normal rate and regular rhythm.     Heart sounds: Normal heart sounds. No murmur heard.  No friction rub. No gallop.   Pulmonary:     Effort: Pulmonary effort is normal. No respiratory distress.     Breath sounds: Normal breath sounds. No wheezing or rales.  Chest:     Chest wall: No tenderness.  Abdominal:     Palpations: Abdomen is soft.  Musculoskeletal:        General: Normal range of motion.     Cervical back: Normal range of motion and neck supple.     Comments: Moderate pain present with palpation of the lumbar spine. No swelling, bruising, or other abnormalities noted at this time. No palpable deformities noted in hips. Patient currently unable to walk due to pain. Is using a wheelchair for mobility.   Lymphadenopathy:     Cervical: No cervical adenopathy.  Skin:    General: Skin is warm and dry.  Neurological:     Mental Status: He is alert and oriented to person, place, and time.     Cranial Nerves: No cranial nerve deficit.  Psychiatric:        Behavior: Behavior normal.        Thought Content: Thought content normal.        Judgment: Judgment normal.   Assessment/Plan:  1. Low back pain due to bilateral sciatica Start etodolac 400mg . This may be taken twice daily as needed to reduce pain and inflammation. Add medrol taper. Take as directed for 6 days. He should carefully monitor his blood sugars while taking this medication, as a common side effect is hyperglycemia. May apply lidocaine patch to effected area of the lower back. Once applied, should leave on for 12 hours, then remove and keep  off for 12 hours. Flexeril 10mg  may be taken up to three times daily as needed for muscle pain and spasms. Advised he use this medication with caution as it can cause dizziness and drowsiness. He voiced understanding. Will get x-rays of the lumbar spine and both hips for further evaluation. Refer to orthopedic provider as indicated.  - DG Lumbar Spine Complete; Future - methylPREDNISolone (MEDROL) 4 MG TBPK tablet; Take by mouth as directed for 6 days  Dispense: 21 tablet; Refill: 0 - etodolac (LODINE) 400 MG tablet; Take 1 tablet (400 mg total) by mouth 2 (two) times daily.  Dispense: 45 tablet; Refill: 1 - cyclobenzaprine (FLEXERIL) 10 MG tablet; Take 1 tablet (10 mg total) by mouth 3 (three) times daily as needed for muscle spasms.  Dispense: 45 tablet; Refill: 0 - lidocaine (LIDODERM) 5 %; Place 1 patch onto the skin daily. Remove & Discard patch within 12 hours or as directed by MD  Dispense: 30 patch; Refill: 0 - DG Hip Unilat W OR W/O Pelvis 2-3 Views Left; Future   General Counseling: satya buttram understanding of the findings of todays visit and agrees with plan of treatment. I have discussed any further diagnostic evaluation that may be needed or ordered today. We also reviewed his medications today. he has been encouraged to call the office with any questions or concerns that should arise related to todays visit.    Counseling:  This patient was seen by Leretha Pol FNP Collaboration with Dr Lavera Guise as a part of collaborative care agreement  Orders Placed This Encounter  Procedures   DG Lumbar Spine Complete   DG Hip Unilat W OR W/O Pelvis 2-3 Views Left    Meds ordered this encounter  Medications   methylPREDNISolone (MEDROL) 4 MG TBPK tablet    Sig: Take by mouth as directed for 6 days    Dispense:  21 tablet    Refill:  0    Order Specific Question:   Supervising Provider    Answer:   Lavera Guise [[id number]]   etodolac (LODINE) 400 MG tablet    Sig: Take 1  tablet (  400 mg total) by mouth 2 (two) times daily.    Dispense:  45 tablet    Refill:  1    Order Specific Question:   Supervising Provider    Answer:   Lavera Guise [[id number]]   cyclobenzaprine (FLEXERIL) 10 MG tablet    Sig: Take 1 tablet (10 mg total) by mouth 3 (three) times daily as needed for muscle spasms.    Dispense:  45 tablet    Refill:  0    Order Specific Question:   Supervising Provider    Answer:   Lavera Guise [[id number]]   lidocaine (LIDODERM) 5 %    Sig: Place 1 patch onto the skin daily. Remove & Discard patch within 12 hours or as directed by MD    Dispense:  30 patch    Refill:  0    Order Specific Question:   Supervising Provider    Answer:   Lavera Guise [[id number]]    Time spent: 30 Minutes

## 2020-02-14 ENCOUNTER — Other Ambulatory Visit: Payer: Self-pay

## 2020-02-14 MED ORDER — POTASSIUM CHLORIDE ER 10 MEQ PO CPCR: 10.0000 meq | ORAL_CAPSULE | Freq: Every day | ORAL | 0 refills | Status: DC

## 2020-02-17 ENCOUNTER — Telehealth: Payer: Self-pay

## 2020-02-18 ENCOUNTER — Other Ambulatory Visit: Payer: Self-pay

## 2020-02-18 DIAGNOSIS — L03031 Cellulitis of right toe: Secondary | ICD-10-CM

## 2020-02-18 DIAGNOSIS — M5416 Radiculopathy, lumbar region: Secondary | ICD-10-CM | POA: Insufficient documentation

## 2020-02-18 MED ORDER — POTASSIUM CHLORIDE ER 10 MEQ PO CPCR: 10.0000 meq | ORAL_CAPSULE | Freq: Every day | ORAL | 0 refills | Status: DC

## 2020-02-18 NOTE — Telephone Encounter (Signed)
Is he ready to go back to wrk on Wednesday? The x-rays shew significant degenerative changes in the lumbar spine, worse since the last time he had x-rays done. Scoliosis is identified. There is also a possible compression fracture at L3. He should have urgent referral to orthopedics for further evaluation and treatment. His hips show degenerative changes, left worse than right, but I think issues with lumbar spine are causing majority of issues. Please also send to beth for referral. Thanks.

## 2020-02-18 NOTE — Telephone Encounter (Signed)
Left message for Pt to call us back to discuss x-ray results. Gave Beth info for the orthopedic referral.

## 2020-02-18 NOTE — Telephone Encounter (Signed)
Pt notified by Eustaquio Maize

## 2020-02-19 ENCOUNTER — Other Ambulatory Visit: Payer: Self-pay

## 2020-02-19 DIAGNOSIS — M5441 Lumbago with sciatica, right side: Secondary | ICD-10-CM | POA: Insufficient documentation

## 2020-02-19 DIAGNOSIS — I152 Hypertension secondary to endocrine disorders: Secondary | ICD-10-CM

## 2020-02-19 DIAGNOSIS — E1159 Type 2 diabetes mellitus with other circulatory complications: Secondary | ICD-10-CM

## 2020-02-19 MED ORDER — DOXAZOSIN MESYLATE 4 MG PO TABS: 4.0000 mg | ORAL_TABLET | Freq: Every day | ORAL | 3 refills | Status: DC

## 2020-03-02 ENCOUNTER — Other Ambulatory Visit: Payer: Self-pay

## 2020-03-02 DIAGNOSIS — M5441 Lumbago with sciatica, right side: Secondary | ICD-10-CM

## 2020-03-02 MED ORDER — CYCLOBENZAPRINE HCL 10 MG PO TABS: 10.0000 mg | ORAL_TABLET | Freq: Three times a day (TID) | ORAL | 0 refills | Status: DC | PRN

## 2020-03-03 ENCOUNTER — Telehealth: Payer: Self-pay

## 2020-03-03 NOTE — Telephone Encounter (Signed)
CONFIRMED PATIENT APPT. -AR °

## 2020-03-04 ENCOUNTER — Other Ambulatory Visit: Payer: Self-pay | Admitting: Adult Health

## 2020-03-05 ENCOUNTER — Ambulatory Visit (INDEPENDENT_AMBULATORY_CARE_PROVIDER_SITE_OTHER): Payer: Managed Care, Other (non HMO) | Admitting: Internal Medicine

## 2020-03-05 ENCOUNTER — Encounter: Payer: Self-pay | Admitting: Internal Medicine

## 2020-03-05 ENCOUNTER — Other Ambulatory Visit: Payer: Self-pay

## 2020-03-05 VITALS — BP 100/60 | HR 70 | Temp 96.8°F | Resp 16 | Ht 69.0 in | Wt 297.2 lb

## 2020-03-05 DIAGNOSIS — G4733 Obstructive sleep apnea (adult) (pediatric): Secondary | ICD-10-CM

## 2020-03-05 DIAGNOSIS — I10 Essential (primary) hypertension: Secondary | ICD-10-CM | POA: Diagnosis not present

## 2020-03-05 DIAGNOSIS — J449 Chronic obstructive pulmonary disease, unspecified: Secondary | ICD-10-CM | POA: Diagnosis not present

## 2020-03-05 DIAGNOSIS — Z6841 Body Mass Index (BMI) 40.0 and over, adult: Secondary | ICD-10-CM

## 2020-03-05 DIAGNOSIS — Z9989 Dependence on other enabling machines and devices: Secondary | ICD-10-CM

## 2020-03-05 NOTE — Progress Notes (Signed)
[facility] [place], [place] [address]  Pulmonary Sleep Medicine   Office Visit Note  Patient Name: Jason Hudson DOB: 02-19-1951 MRN [id number]  Date of Service: 03/05/2020  Complaints/HPI: Pt is here for pulmonary follow up. He has a history of Copd, asthma, OSA and cad. Pt reports good compliance with CPAP therapy. Cleaning machine by hand, and changing filters and tubing as directed. Denies headaches, sinus issues, palpitations, or hemoptysis.  He reports he has been having some back pain recently that has prevented him from wearing it all night due to having to get up to sleep in the chair.  He has only needed his rescue inahler and nebulizer a few times over the last month or two.      ROS  General: (-) fever, (-) chills, (-) night sweats, (-) weakness Skin: (-) rashes, (-) itching,. Eyes: (-) visual changes, (-) redness, (-) itching. Nose and Sinuses: (-) nasal stuffiness or itchiness, (-) postnasal drip, (-) nosebleeds, (-) sinus trouble. Mouth and Throat: (-) sore throat, (-) hoarseness. Neck: (-) swollen glands, (-) enlarged thyroid, (-) neck pain. Respiratory: - cough, (-) bloody sputum, - shortness of breath, - wheezing. Cardiovascular: - ankle swelling, (-) chest pain. Lymphatic: (-) lymph node enlargement. Neurologic: (-) numbness, (-) tingling. Psychiatric: (-) anxiety, (-) depression   Current Medication: Outpatient Encounter Medications as of 03/05/2020  Medication Sig Note  . albuterol (VENTOLIN HFA) 108 (90 Base) MCG/ACT inhaler Inhale 1-2 puffs into the lungs every 6 (six) hours as needed for wheezing or shortness of breath.   . carvedilol (COREG) 25 MG tablet Take 0.5 tablets (12.5 mg total) by mouth 2 (two) times daily.   . Chlorphen-Phenyleph-ASA (ALKA-SELTZER PLUS COLD PO) Take 1 tablet by mouth daily as needed (congestion).    . Cholecalciferol (VITAMIN D3) 2000 units TABS Take 2,000 Units by mouth at bedtime.   .  cyclobenzaprine (FLEXERIL) 10 MG tablet Take 1 tablet (10 mg total) by mouth 3 (three) times daily as needed for muscle spasms.   Marland Kitchen diltiazem (TIAZAC) 360 MG 24 hr capsule Take 360 mg by mouth daily.    Marland Kitchen doxazosin (CARDURA) 4 MG tablet Take 1 tablet (4 mg total) by mouth at bedtime.   . Dulaglutide (TRULICITY) 1.5 QB/3.4LP SOPN Inject 1.5 mg into the skin every Wednesday.   . etodolac (LODINE) 400 MG tablet Take 1 tablet (400 mg total) by mouth 2 (two) times daily.   . fluticasone (FLONASE) 50 MCG/ACT nasal spray Place 2 sprays into both nostrils 2 (two) times daily.    Marland Kitchen glipiZIDE (GLUCOTROL XL) 10 MG 24 hr tablet Take 1 tablet (10 mg total) by mouth daily.   . Insulin Degludec (TRESIBA FLEXTOUCH) 200 UNIT/ML SOPN    . ipratropium (ATROVENT) 0.02 % nebulizer solution Take 0.5 mg by nebulization every 6 (six) hours as needed for wheezing or shortness of breath.   . lidocaine (LIDODERM) 5 % Place 1 patch onto the skin daily. Remove & Discard patch within 12 hours or as directed by MD   . metolazone (ZAROXOLYN) 5 MG tablet Take 5 mg (1 tablet) by mouth on Mondays, Wednesdays, and Fridays, about 30 minutes before taking your morning dose of furosemide (Lasix).   . multivitamin (ONE-A-DAY MEN'S) TABS tablet Take 1 tablet by mouth daily.   . naproxen (EC NAPROSYN) 500 MG EC tablet Take 500 mg by mouth 2 (two) times daily with a meal.   . nitroGLYCERIN (NITROSTAT) 0.4 MG SL tablet Place 1  tablet (0.4 mg total) under the tongue every 5 (five) minutes as needed for chest pain.   . potassium chloride (MICRO-K) 10 MEQ CR capsule Take 1 capsule (10 mEq total) by mouth daily.   . SYMBICORT 160-4.5 MCG/ACT inhaler TAKE 2 PUFFS BY MOUTH TWICE A DAY   . traMADol (ULTRAM) 50 MG tablet Take by mouth every 6 (six) hours as needed.   Marland Kitchen amiodarone (PACERONE) 200 MG tablet Take 1 tablet (200 mg total) by mouth daily for 30 days.   . bifidobacterium infantis (ALIGN) capsule Take 1 capsule by mouth daily. (Patient not  taking: Reported on 03/05/2020)   . Fluticasone-Umeclidin-Vilant (TRELEGY ELLIPTA) 100-62.5-25 MCG/INH AEPB Inhale 1 puff into the lungs daily. (Patient not taking: Reported on 03/05/2020)   . furosemide (LASIX) 80 MG tablet Take 1 tablet (80 mg total) by mouth 2 (two) times daily for 30 days.   Marland Kitchen linaclotide (LINZESS) 145 MCG CAPS capsule Take 1 capsule (145 mcg total) by mouth daily before breakfast. (Patient not taking: Reported on 03/05/2020)   . loratadine (CLARITIN) 10 MG tablet Take 10 mg by mouth daily. (Patient not taking: Reported on 03/05/2020) 08/24/2018: Takes liqui-gels at home  . methylPREDNISolone (MEDROL) 4 MG TBPK tablet Take by mouth as directed for 6 days (Patient not taking: Reported on 03/05/2020)   . mupirocin ointment (BACTROBAN) 2 % Apply to affected areas twice daily for 14 days then as needed (Patient not taking: Reported on 03/05/2020)   . sulfamethoxazole-trimethoprim (BACTRIM) 400-80 MG tablet Take 1 tablet by mouth 2 (two) times daily. (Patient not taking: Reported on 03/05/2020)    No facility-administered encounter medications on file as of 03/05/2020.    Surgical History: Past Surgical History:  Procedure Laterality Date  . COLONOSCOPY WITH PROPOFOL N/A 11/07/2017   Procedure: COLONOSCOPY WITH PROPOFOL;  Surgeon: [name], MD;  Location[facility] ENDOSCOPY;  Service: Gastroenterology;  Laterality: N/A;  . ESOPHAGOGASTRODUODENOSCOPY (EGD) WITH PROPOFOL N/A 11/07/2017   Procedure: ESOPHAGOGASTRODUODENOSCOPY (EGD) WITH PROPOFOL;  Surgeon: [name], MD;  Location: [facility] ENDOSCOPY;  Service: Gastroenterology;  Laterality: N/A;  . LEFT HEART CATH AND CORONARY ANGIOGRAPHY N/A 10/23/2017   Procedure: LEFT HEART CATH AND CORONARY ANGIOGRAPHY;  Surgeon: Isaias Cowman, MD;  Location: [facility] LAB;  Service: Cardiovascular;  Laterality: N/A;    Medical History: Past Medical History:  Diagnosis Date  . CHF (congestive heart failure) ([place])   . COPD (chronic  obstructive pulmonary disease) ([place])   . Diabetes mellitus without complication ([place])   . Gastroesophageal reflux disease 12/15/2017  . Hypertension   . Melena 11/05/2017  . Pneumonia 07/27/2016    Family History: Family History  Problem Relation Age of Onset  . Cancer Mother   . CVA Father   . Heart disease Father     Social History: Social History   Socioeconomic History  . Marital status: Single    Spouse name: Not on file  . Number of children: Not on file  . Years of education: Not on file  . Highest education level: Not on file  Occupational History  . Not on file  Tobacco Use  . Smoking status: Never Smoker  . Smokeless tobacco: Never Used  Substance and Sexual Activity  . Alcohol use: Yes    Comment: ocassionally  . Drug use: Never  . Sexual activity: Not on file  Other Topics Concern  . Not on file  Social History Narrative  . Not on file   Social Determinants of Health  Financial Resource Strain:   . Difficulty of Paying Living Expenses:   Food Insecurity:   . Worried About Charity fundraiser in the Last Year:   . Arboriculturist in the Last Year:   Transportation Needs:   . Film/video editor (Medical):   Marland Kitchen Lack of Transportation (Non-Medical):   Physical Activity:   . Days of Exercise per Week:   . Minutes of Exercise per Session:   Stress:   . Feeling of Stress :   Social Connections:   . Frequency of Communication with Friends and Family:   . Frequency of Social Gatherings with Friends and Family:   . Attends Religious Services:   . Active Member of Clubs or Organizations:   . Attends Archivist Meetings:   Marland Kitchen Marital Status:   Intimate Partner Violence:   . Fear of Current or Ex-Partner:   . Emotionally Abused:   Marland Kitchen Physically Abused:   . Sexually Abused:     Vital Signs: Blood pressure (!) 90/57, pulse 70, temperature (!) 96.8 F (36 C), resp. rate 16, height 5\' 9"  (1.753 m), weight 297 lb 3.2 oz (134.8 kg), SpO2 94  %.  Examination: General Appearance: The patient is well-developed, well-nourished, and in no distress. Skin: Gross inspection of skin unremarkable. Head: normocephalic, no gross deformities. Eyes: no gross deformities noted. ENT: ears appear grossly normal no exudates. Neck: Supple. No thyromegaly. No LAD. Respiratory: clear bilaterally. Cardiovascular: Normal S1 and S2 without murmur or rub. Extremities: No cyanosis. pulses are equal. Neurologic: Alert and oriented. No involuntary movements.  LABS: No results found for this or any previous visit (from the past 2160 hour(s)).  Radiology: DG Lumbar Spine Complete  Result Date: 02/15/2020 CLINICAL DATA:  Severe LOWER back pain radiating into the hips. No known injury. EXAM: LUMBAR SPINE - COMPLETE 4+ VIEW COMPARISON:  10/06/2007 FINDINGS: There is convex LEFT scoliosis of the LOWER lumbar spine, associated with significant degenerative changes. Degenerative changes have progressed since the previous abdominal plain film. There is anterolisthesis of L4 on L5 which measures 1.2 centimeters. There is loss of vertebral height of L3, consistent with remote compression fracture. IMPRESSION: 1. Significant degenerative changes, progressed since prior plain film. 2. Grade 1 anterolisthesis of L4 on L5. Electronically Signed   By: Nolon Nations M.D.   On: 02/15/2020 07:35   DG Hip Unilat W OR W/O Pelvis 2-3 Views Left  Result Date: 02/15/2020 CLINICAL DATA:  Bilateral hip pain.  Difficulty walking. EXAM: DG HIP (WITH OR WITHOUT PELVIS) 2-3V LEFT COMPARISON:  10/06/2007 FINDINGS: There are mild degenerative changes in both, LEFT greater than RIGHT. Marked degenerative changes are identified in the LOWER lumbar spine, associated with scoliosis convex to the LEFT. Significant bilateral SI joint sclerosis. No acute fracture. IMPRESSION: 1. Degenerative changes. 2. No evidence for acute abnormality. Electronically Signed   By: Nolon Nations M.D.   On:  02/15/2020 07:36   DG Hip Unilat W OR W/O Pelvis 2-3 Views Right  Result Date: 02/15/2020 CLINICAL DATA:  Bilateral hip pain.  Difficulty walking. EXAM: DG HIP (WITH OR WITHOUT PELVIS) 2-3V RIGHT COMPARISON:  10/06/2007 FINDINGS: Degenerative changes seen in both hips, LEFT greater than RIGHT. Significant degenerative changes the LOWER lumbar spine and bilateral SI joints. There is no acute fracture or subluxation. IMPRESSION: Degenerative changes. Electronically Signed   By: Nolon Nations M.D.   On: 02/15/2020 07:37    No results found.  DG Lumbar Spine Complete  Result Date:  02/15/2020 CLINICAL DATA:  Severe LOWER back pain radiating into the hips. No known injury. EXAM: LUMBAR SPINE - COMPLETE 4+ VIEW COMPARISON:  10/06/2007 FINDINGS: There is convex LEFT scoliosis of the LOWER lumbar spine, associated with significant degenerative changes. Degenerative changes have progressed since the previous abdominal plain film. There is anterolisthesis of L4 on L5 which measures 1.2 centimeters. There is loss of vertebral height of L3, consistent with remote compression fracture. IMPRESSION: 1. Significant degenerative changes, progressed since prior plain film. 2. Grade 1 anterolisthesis of L4 on L5. Electronically Signed   By: Nolon Nations M.D.   On: 02/15/2020 07:35   DG Hip Unilat W OR W/O Pelvis 2-3 Views Left  Result Date: 02/15/2020 CLINICAL DATA:  Bilateral hip pain.  Difficulty walking. EXAM: DG HIP (WITH OR WITHOUT PELVIS) 2-3V LEFT COMPARISON:  10/06/2007 FINDINGS: There are mild degenerative changes in both, LEFT greater than RIGHT. Marked degenerative changes are identified in the LOWER lumbar spine, associated with scoliosis convex to the LEFT. Significant bilateral SI joint sclerosis. No acute fracture. IMPRESSION: 1. Degenerative changes. 2. No evidence for acute abnormality. Electronically Signed   By: Nolon Nations M.D.   On: 02/15/2020 07:36   DG Hip Unilat W OR W/O Pelvis 2-3  Views Right  Result Date: 02/15/2020 CLINICAL DATA:  Bilateral hip pain.  Difficulty walking. EXAM: DG HIP (WITH OR WITHOUT PELVIS) 2-3V RIGHT COMPARISON:  10/06/2007 FINDINGS: Degenerative changes seen in both hips, LEFT greater than RIGHT. Significant degenerative changes the LOWER lumbar spine and bilateral SI joints. There is no acute fracture or subluxation. IMPRESSION: Degenerative changes. Electronically Signed   By: Nolon Nations M.D.   On: 02/15/2020 07:37      Assessment and Plan: Patient Active Problem List   Diagnosis Date Noted  . Low back pain due to bilateral sciatica 02/19/2020  . Encounter for general adult medical examination with abnormal findings 08/31/2019  . Flu vaccine need 08/31/2019  . Need for vaccination against Streptococcus pneumoniae using pneumococcal conjugate vaccine 13 08/31/2019  . Dysuria 08/31/2019  . Irritable bowel syndrome with constipation 05/29/2019  . Type 2 diabetes mellitus with hyperglycemia ([place]) 03/17/2019  . Lower extremity edema 12/08/2018  . Diabetic ulcer of calf ([place]) 11/10/2018  . Restrictive lung disease 10/26/2018  . Acute exacerbation of CHF (congestive heart failure) ([place]) 09/12/2018  . Acute on chronic systolic congestive heart failure ([place]) 09/06/2018  . AKI (acute kidney injury) ([place]) 09/06/2018  . Moderate intermittent asthma without complication 50/53/9767  . Coronary arteriosclerosis 08/30/2018  . Diastolic dysfunction 34/19/3790  . Hypercholesterolemia 08/30/2018  . Obesity 08/30/2018  . COPD exacerbation ([place]) 08/24/2018  . Hyperlipidemia due to type 2 diabetes mellitus ([place]) 03/28/2018  . Type 2 diabetes mellitus with chronic kidney disease, with long-term current use of insulin ([place]) 03/28/2018  . Essential hypertension 01/19/2018  . Chronic obstructive lung disease ([place]) 12/15/2017  . Gastroesophageal reflux disease 12/15/2017  . Obstructive sleep apnea of adult 12/15/2017  . Melena 11/05/2017  . Congestive  heart failure ([place]) 10/20/2017  . Pneumonia 07/27/2016    1. OSA on CPAP Continue with pap therapy as prescribed.   2. Chronic obstructive asthma ([place]) Continue to use inhalers and nebulizer.   3. Essential hypertension Controlled, continue current management.   4. Morbid obesity ([place]) BMI over 40  5. BMI 40.0-44.9, adult (HCC) Obesity Counseling: Risk Assessment: An assessment of behavioral risk factors was made today and includes lack of exercise sedentary lifestyle, lack of portion control and poor  dietary habits.  Risk Modification Advice: She was counseled on portion control guidelines. Restricting daily caloric intake to 1600. The detrimental long term effects of obesity on her health and ongoing poor compliance was also discussed with the patient.    General Counseling: I have discussed the findings of the evaluation and examination with Jason Hudson.  I have also discussed any further diagnostic evaluation thatmay be needed or ordered today. Jason Hudson verbalizes understanding of the findings of todays visit. We also reviewed his medications today and discussed drug interactions and side effects including but not limited excessive drowsiness and altered mental states. We also discussed that there is always a risk not just to him but also people around him. he has been encouraged to call the office with any questions or concerns that should arise related to todays visit.  No orders of the defined types were placed in this encounter.    Time spent: 30 This patient was seen by Orson Gear AGNP-C in Collaboration with Dr. Devona Konig as a part of collaborative care agreement.   I have personally obtained a history, examined the patient, evaluated laboratory and imaging results, formulated the assessment and plan and placed orders.    Allyne Gee, MD [facility] Pulmonary and Critical Care Sleep medicine

## 2020-03-24 ENCOUNTER — Telehealth: Payer: Self-pay

## 2020-03-24 NOTE — Telephone Encounter (Signed)
Discuss adding a short acting beta agonist inhaler for rescue therapy at next appointment.

## 2020-05-14 ENCOUNTER — Other Ambulatory Visit: Payer: Self-pay

## 2020-05-14 DIAGNOSIS — I152 Hypertension secondary to endocrine disorders: Secondary | ICD-10-CM

## 2020-05-14 DIAGNOSIS — E1159 Type 2 diabetes mellitus with other circulatory complications: Secondary | ICD-10-CM

## 2020-05-14 MED ORDER — DOXAZOSIN MESYLATE 4 MG PO TABS: 4.0000 mg | ORAL_TABLET | Freq: Every day | ORAL | 3 refills | Status: DC

## 2020-05-22 ENCOUNTER — Other Ambulatory Visit: Payer: Self-pay

## 2020-05-22 DIAGNOSIS — I5032 Chronic diastolic (congestive) heart failure: Secondary | ICD-10-CM

## 2020-05-22 DIAGNOSIS — R600 Localized edema: Secondary | ICD-10-CM

## 2020-05-22 MED ORDER — FUROSEMIDE 80 MG PO TABS: 80.0000 mg | ORAL_TABLET | Freq: Two times a day (BID) | ORAL | 0 refills | Status: DC

## 2020-05-25 ENCOUNTER — Telehealth: Payer: Self-pay

## 2020-05-25 NOTE — Telephone Encounter (Signed)
Pt called Friday stating he had swelling, redness, and tenderness in legs. He was advise to go to ER or urgent care.  Pt called this morning and was put on [facility] schedule for Monday

## 2020-05-26 ENCOUNTER — Ambulatory Visit (INDEPENDENT_AMBULATORY_CARE_PROVIDER_SITE_OTHER): Payer: Medicare HMO | Admitting: Internal Medicine

## 2020-05-26 ENCOUNTER — Encounter: Payer: Self-pay | Admitting: Internal Medicine

## 2020-05-26 DIAGNOSIS — I5032 Chronic diastolic (congestive) heart failure: Secondary | ICD-10-CM

## 2020-05-26 DIAGNOSIS — N184 Chronic kidney disease, stage 4 (severe): Secondary | ICD-10-CM

## 2020-05-26 DIAGNOSIS — M5441 Lumbago with sciatica, right side: Secondary | ICD-10-CM | POA: Diagnosis not present

## 2020-05-26 DIAGNOSIS — L02419 Cutaneous abscess of limb, unspecified: Secondary | ICD-10-CM

## 2020-05-26 DIAGNOSIS — L03119 Cellulitis of unspecified part of limb: Secondary | ICD-10-CM

## 2020-05-26 DIAGNOSIS — M5442 Lumbago with sciatica, left side: Secondary | ICD-10-CM

## 2020-05-26 MED ORDER — METOLAZONE 5 MG PO TABS: ORAL_TABLET | ORAL | 0 refills | Status: DC

## 2020-05-26 MED ORDER — FUROSEMIDE 80 MG PO TABS: 80.0000 mg | ORAL_TABLET | Freq: Two times a day (BID) | ORAL | 0 refills | Status: DC

## 2020-05-26 MED ORDER — AMOXICILLIN-POT CLAVULANATE 875-125 MG PO TABS: 1.0000 | ORAL_TABLET | Freq: Two times a day (BID) | ORAL | 0 refills | Status: DC

## 2020-05-26 MED ORDER — ETODOLAC 400 MG PO TABS: 400.0000 mg | ORAL_TABLET | Freq: Two times a day (BID) | ORAL | 1 refills | Status: DC

## 2020-05-26 MED ORDER — CYCLOBENZAPRINE HCL 10 MG PO TABS: ORAL_TABLET | ORAL | 0 refills | Status: DC

## 2020-05-26 NOTE — Progress Notes (Signed)
[facility] [place], [place] [address]  Internal MEDICINE  Telephone Visit  Patient Name: Jason Hudson  [id number]  [id number]  Date of Service: 05/29/2020  I connected with the patient at 945 by telephone and verified the patients identity using two identifiers.   I discussed the limitations, risks, security and privacy concerns of performing an evaluation and management service by telephone and the availability of in person appointments. I also discussed with the patient that there may be a patient responsible charge related to the service.  The patient expressed understanding and agrees to proceed.    Chief Complaint  Patient presents with  . Telephone Assessment    [phone] virtual  . Telephone Screen  . Back Pain    since last week  . Leg Swelling    reddness on front of legs.  left worse than right x 2 weeks    HPI  Pt is connected via video with c/o foot and leg swelling, he thinks he is having flare up of his cellulitis, pt has been given abc in the past. He is also having swelling, has been out of his diuretics, he is also on NSAIDS for back pain, pt has multiple other medical problems, last hg a1c is high, has CKDstage 4. I am not sure if he sees other specilaist, does have OSA  Echo report in 2020 Left ventricle: The cavity size was mildly dilated. There was moderate concentric hypertrophy. Systolic function was mildly to moderately reduced. The estimated ejection fraction was in the range of 40% to 45%. Current Medication: Outpatient Encounter Medications as of 05/26/2020  Medication Sig Note  . albuterol (VENTOLIN HFA) 108 (90 Base) MCG/ACT inhaler Inhale 1-2 puffs into the lungs every 6 (six) hours as needed for wheezing or shortness of breath.   . bifidobacterium infantis (ALIGN) capsule Take 1 capsule by mouth daily.    . carvedilol (COREG) 25 MG tablet Take 0.5 tablets (12.5 mg total) by mouth 2 (two) times daily.   .  Chlorphen-Phenyleph-ASA (ALKA-SELTZER PLUS COLD PO) Take 1 tablet by mouth daily as needed (congestion).    . Cholecalciferol (VITAMIN D3) 2000 units TABS Take 2,000 Units by mouth at bedtime.   . cyclobenzaprine (FLEXERIL) 10 MG tablet Take one tab po bid prn for back   . diltiazem (TIAZAC) 360 MG 24 hr capsule Take 360 mg by mouth daily.    Marland Kitchen doxazosin (CARDURA) 4 MG tablet Take 1 tablet (4 mg total) by mouth at bedtime.   . Dulaglutide (TRULICITY) 1.5 OT/1.5BW SOPN Inject 1.5 mg into the skin every Wednesday.   . fluticasone (FLONASE) 50 MCG/ACT nasal spray Place 2 sprays into both nostrils 2 (two) times daily.    . furosemide (LASIX) 80 MG tablet Take 1 tablet (80 mg total) by mouth 2 (two) times daily.   Marland Kitchen glipiZIDE (GLUCOTROL XL) 10 MG 24 hr tablet Take 1 tablet (10 mg total) by mouth daily.   Marland Kitchen ipratropium (ATROVENT) 0.02 % nebulizer solution Take 0.5 mg by nebulization every 6 (six) hours as needed for wheezing or shortness of breath.   . lidocaine (LIDODERM) 5 % Place 1 patch onto the skin daily. Remove & Discard patch within 12 hours or as directed by MD   . loratadine (CLARITIN) 10 MG tablet Take 10 mg by mouth daily.  08/24/2018: Takes liqui-gels at home  . multivitamin (ONE-A-DAY MEN'S) TABS tablet Take 1 tablet by mouth daily.   . nitroGLYCERIN (NITROSTAT) 0.4 MG SL  tablet Place 1 tablet (0.4 mg total) under the tongue every 5 (five) minutes as needed for chest pain.   . potassium chloride (MICRO-K) 10 MEQ CR capsule Take 1 capsule (10 mEq total) by mouth daily.   . SYMBICORT 160-4.5 MCG/ACT inhaler TAKE 2 PUFFS BY MOUTH TWICE A DAY   . [DISCONTINUED] cyclobenzaprine (FLEXERIL) 10 MG tablet Take 1 tablet (10 mg total) by mouth 3 (three) times daily as needed for muscle spasms.   . [DISCONTINUED] etodolac (LODINE) 400 MG tablet Take 1 tablet (400 mg total) by mouth 2 (two) times daily.   . [DISCONTINUED] etodolac (LODINE) 400 MG tablet Take 1 tablet (400 mg total) by mouth 2 (two) times  daily.   . [DISCONTINUED] furosemide (LASIX) 80 MG tablet Take 1 tablet (80 mg total) by mouth 2 (two) times daily.   Marland Kitchen amiodarone (PACERONE) 200 MG tablet Take 1 tablet (200 mg total) by mouth daily for 30 days.   Marland Kitchen amoxicillin-clavulanate (AUGMENTIN) 875-125 MG tablet Take 1 tablet by mouth 2 (two) times daily.   . Insulin Degludec (TRESIBA FLEXTOUCH) 200 UNIT/ML SOPN  (Patient not taking: Reported on 05/26/2020)   . metolazone (ZAROXOLYN) 5 MG tablet Take 5 mg (1 tablet) by mouth on Mondays, Wednesdays, and Fridays, about 30 minutes before taking your morning dose of furosemide (Lasix).   . [DISCONTINUED] Fluticasone-Umeclidin-Vilant (TRELEGY ELLIPTA) 100-62.5-25 MCG/INH AEPB Inhale 1 puff into the lungs daily. (Patient not taking: Reported on 05/26/2020)   . [DISCONTINUED] linaclotide (LINZESS) 145 MCG CAPS capsule Take 1 capsule (145 mcg total) by mouth daily before breakfast. (Patient not taking: Reported on 05/26/2020)   . [DISCONTINUED] methylPREDNISolone (MEDROL) 4 MG TBPK tablet Take by mouth as directed for 6 days (Patient not taking: Reported on 05/26/2020)   . [DISCONTINUED] metolazone (ZAROXOLYN) 5 MG tablet Take 5 mg (1 tablet) by mouth on Mondays, Wednesdays, and Fridays, about 30 minutes before taking your morning dose of furosemide (Lasix). (Patient not taking: Reported on 05/26/2020)   . [DISCONTINUED] mupirocin ointment (BACTROBAN) 2 % Apply to affected areas twice daily for 14 days then as needed (Patient not taking: Reported on 05/26/2020)   . [DISCONTINUED] naproxen (EC NAPROSYN) 500 MG EC tablet Take 500 mg by mouth 2 (two) times daily with a meal. (Patient not taking: Reported on 05/26/2020)   . [DISCONTINUED] sulfamethoxazole-trimethoprim (BACTRIM) 400-80 MG tablet Take 1 tablet by mouth 2 (two) times daily. (Patient not taking: Reported on 05/26/2020)   . [DISCONTINUED] traMADol (ULTRAM) 50 MG tablet Take by mouth every 6 (six) hours as needed. (Patient not taking: Reported on  05/26/2020)    No facility-administered encounter medications on file as of 05/26/2020.    Surgical History: Past Surgical History:  Procedure Laterality Date  . COLONOSCOPY WITH PROPOFOL N/A 11/07/2017   Procedure: COLONOSCOPY WITH PROPOFOL;  Surgeon: [name], MD;  Location[facility] ENDOSCOPY;  Service: Gastroenterology;  Laterality: N/A;  . ESOPHAGOGASTRODUODENOSCOPY (EGD) WITH PROPOFOL N/A 11/07/2017   Procedure: ESOPHAGOGASTRODUODENOSCOPY (EGD) WITH PROPOFOL;  Surgeon: [name], MD;  Location: [facility] ENDOSCOPY;  Service: Gastroenterology;  Laterality: N/A;  . LEFT HEART CATH AND CORONARY ANGIOGRAPHY N/A 10/23/2017   Procedure: LEFT HEART CATH AND CORONARY ANGIOGRAPHY;  Surgeon: Isaias Cowman, MD;  Location: [place] CV LAB;  Service: Cardiovascular;  Laterality: N/A;    Medical History: Past Medical History:  Diagnosis Date  . CHF (congestive heart failure) ([place])   . COPD (chronic obstructive pulmonary disease) ([place])   . Diabetes mellitus without complication ([place])   .  Gastroesophageal reflux disease 12/15/2017  . Hypertension   . Melena 11/05/2017  . Pneumonia 07/27/2016    Family History: Family History  Problem Relation Age of Onset  . Cancer Mother   . CVA Father   . Heart disease Father     Social History   Socioeconomic History  . Marital status: Single    Spouse name: Not on file  . Number of children: Not on file  . Years of education: Not on file  . Highest education level: Not on file  Occupational History  . Not on file  Tobacco Use  . Smoking status: Never Smoker  . Smokeless tobacco: Never Used  Substance and Sexual Activity  . Alcohol use: Yes    Comment: ocassionally  . Drug use: Never  . Sexual activity: Not on file  Other Topics Concern  . Not on file  Social History Narrative  . Not on file   Social Determinants of Health   Financial Resource Strain:   . Difficulty of Paying Living Expenses: Not on file  Food Insecurity:   .  Worried About Charity fundraiser in the Last Year: Not on file  . Ran Out of Food in the Last Year: Not on file  Transportation Needs:   . Lack of Transportation (Medical): Not on file  . Lack of Transportation (Non-Medical): Not on file  Physical Activity:   . Days of Exercise per Week: Not on file  . Minutes of Exercise per Session: Not on file  Stress:   . Feeling of Stress : Not on file  Social Connections:   . Frequency of Communication with Friends and Family: Not on file  . Frequency of Social Gatherings with Friends and Family: Not on file  . Attends Religious Services: Not on file  . Active Member of Clubs or Organizations: Not on file  . Attends Archivist Meetings: Not on file  . Marital Status: Not on file  Intimate Partner Violence:   . Fear of Current or Ex-Partner: Not on file  . Emotionally Abused: Not on file  . Physically Abused: Not on file  . Sexually Abused: Not on file      Review of Systems  Constitutional: Negative for chills, fatigue and unexpected weight change.  HENT: Negative for congestion, postnasal drip, rhinorrhea, sneezing and sore throat.   Eyes: Negative for redness.  Respiratory: Positive for shortness of breath. Negative for cough and chest tightness.   Cardiovascular: Positive for leg swelling. Negative for palpitations.  Gastrointestinal: Negative for abdominal pain, constipation, diarrhea, nausea and vomiting.  Genitourinary: Positive for decreased urine volume. Negative for dysuria and frequency.  Musculoskeletal: Positive for arthralgias and back pain. Negative for joint swelling and neck pain.  Skin: Positive for rash.  Neurological: Negative.  Negative for tremors and numbness.  Hematological: Negative for adenopathy. Does not bruise/bleed easily.  Psychiatric/Behavioral: Negative for behavioral problems (Depression), sleep disturbance and suicidal ideas. The patient is not nervous/anxious.     Vital Signs: BP 130/70    Pulse 67   Resp 16   Ht 5\' 8"  (1.727 m)   Wt (!) 303 lb (137.4 kg)   SpO2 (!) 89%   BMI 46.07 kg/m    Observation/Objective:  Pt is obese, grossly he is not in any acute distress, legs are edematous, noticed his O2 sats are low from home ?? Accurate or not   Assessment/Plan: 1.  Cellulitis and abscess of lower extremity Pt has h/o cellulitis  -  amoxicillin-clavulanate (AUGMENTIN) 875-125 MG tablet; Take 1 tablet by mouth 2 (two) times daily.  Dispense: 20 tablet; Refill: 0  2. Low back pain due to bilateral sciatica - cyclobenzaprine (FLEXERIL) 10 MG tablet; Take one tab po bid prn for back  Dispense: 15 tablet; Refill: 0 - DC [place]  3. Chronic diastolic congestive heart failure (HCC) Pt is instructed to come back for in person visit for proper evaluation and treatment, short term course of  - furosemide (LASIX) 80 MG tablet; Take 1 tablet (80 mg total) by mouth 2 (two) times daily.  Dispense: 60 tablet; Refill: 0 - metolazone (ZAROXOLYN) 5 MG tablet; Take 5 mg (1 tablet) by mouth on Mondays, Wednesdays, and Fridays, about 30 minutes before taking your morning dose of furosemide (Lasix).  Dispense: 10 tablet; Refill: 0  4. Chronic kidney disease (CKD) stage G4/A1, severely decreased glomerular filtration rate (GFR) between 15-29 mL/min/1.73 square meter and albuminuria creatinine ratio less than 30 mg/g (HCC) Pt has h/o cellulitis, AVOID multiple nephrotoxic, will avoid   General Counseling: Malva Cogan understanding of the findings of today's phone visit and agrees with plan of treatment. I have discussed any further diagnostic evaluation that may be needed or ordered today. We also reviewed his medications today. he has been encouraged to call the office with any questions or concerns that should arise related to todays visit.   Meds ordered this encounter  Medications  . amoxicillin-clavulanate (AUGMENTIN) 875-125 MG tablet    Sig: Take 1 tablet by mouth 2 (two) times  daily.    Dispense:  20 tablet    Refill:  0  . cyclobenzaprine (FLEXERIL) 10 MG tablet    Sig: Take one tab po bid prn for back    Dispense:  15 tablet    Refill:  0  . DISCONTD: etodolac (LODINE) 400 MG tablet    Sig: Take 1 tablet (400 mg total) by mouth 2 (two) times daily.    Dispense:  45 tablet    Refill:  1  . furosemide (LASIX) 80 MG tablet    Sig: Take 1 tablet (80 mg total) by mouth 2 (two) times daily.    Dispense:  60 tablet    Refill:  0  . metolazone (ZAROXOLYN) 5 MG tablet    Sig: Take 5 mg (1 tablet) by mouth on Mondays, Wednesdays, and Fridays, about 30 minutes before taking your morning dose of furosemide (Lasix).    Dispense:  10 tablet    Refill:  0    Time spent [place] [facility]

## 2020-05-28 ENCOUNTER — Ambulatory Visit: Payer: Managed Care, Other (non HMO) | Admitting: Internal Medicine

## 2020-05-28 ENCOUNTER — Ambulatory Visit: Payer: Managed Care, Other (non HMO) | Admitting: Hospice and Palliative Medicine

## 2020-06-02 ENCOUNTER — Ambulatory Visit: Payer: Managed Care, Other (non HMO) | Admitting: Internal Medicine

## 2020-06-04 ENCOUNTER — Other Ambulatory Visit: Payer: Self-pay

## 2020-06-04 ENCOUNTER — Ambulatory Visit (INDEPENDENT_AMBULATORY_CARE_PROVIDER_SITE_OTHER): Payer: Medicare HMO | Admitting: Internal Medicine

## 2020-06-04 ENCOUNTER — Encounter: Payer: Self-pay | Admitting: Internal Medicine

## 2020-06-04 VITALS — BP 106/60 | HR 64 | Temp 98.0°F | Resp 16 | Ht 68.0 in | Wt 305.0 lb

## 2020-06-04 DIAGNOSIS — E1165 Type 2 diabetes mellitus with hyperglycemia: Secondary | ICD-10-CM

## 2020-06-04 DIAGNOSIS — L02419 Cutaneous abscess of limb, unspecified: Secondary | ICD-10-CM

## 2020-06-04 DIAGNOSIS — N184 Chronic kidney disease, stage 4 (severe): Secondary | ICD-10-CM

## 2020-06-04 DIAGNOSIS — I4891 Unspecified atrial fibrillation: Secondary | ICD-10-CM

## 2020-06-04 DIAGNOSIS — I5032 Chronic diastolic (congestive) heart failure: Secondary | ICD-10-CM | POA: Diagnosis not present

## 2020-06-04 DIAGNOSIS — E782 Mixed hyperlipidemia: Secondary | ICD-10-CM

## 2020-06-04 DIAGNOSIS — I10 Essential (primary) hypertension: Secondary | ICD-10-CM

## 2020-06-04 DIAGNOSIS — L03119 Cellulitis of unspecified part of limb: Secondary | ICD-10-CM

## 2020-06-04 LAB — POCT GLYCOSYLATED HEMOGLOBIN (HGB A1C): Hemoglobin A1C: 7.1 % — AB (ref 4.0–5.6)

## 2020-06-04 MED ORDER — METOLAZONE 5 MG PO TABS: ORAL_TABLET | ORAL | 0 refills | Status: DC

## 2020-06-04 MED ORDER — AMOXICILLIN-POT CLAVULANATE 875-125 MG PO TABS: 1.0000 | ORAL_TABLET | Freq: Two times a day (BID) | ORAL | 0 refills | Status: DC

## 2020-06-04 NOTE — Progress Notes (Signed)
[facility] [place], [place] [address]  Internal MEDICINE  Office Visit Note  Patient Name: Jason Hudson  [id number]  [id number]  Date of Service: 06/05/2020  Chief Complaint  Patient presents with  . Follow-up    1 week med check, leg swelling  . controlled substance policy    acknowledged    HPI Pt has multiple chronic problems. He lost his insurance and has not been regular with his appointments. He was seen virtual for bilateral worsening lower ex t edema and cellulitis, he did not have additional refills for his diuretics at the time, pt responded well to Augmentin and Diuretics. He is out of his Trulicity as well  Pt has OSA and is on CPAP. Pt has atrial fibrillation, he should have been on Eliquis. Pt also has CAD one vessel disease and was recommended for medical management  CKD as well, followed by nephrology. Pt has been on Lodine and naproxen, does not know they are nephrotoxic Pt also had gastric ulcer in 2019  Current Medication: Outpatient Encounter Medications as of 06/04/2020  Medication Sig Note  . albuterol (VENTOLIN HFA) 108 (90 Base) MCG/ACT inhaler Inhale 1-2 puffs into the lungs every 6 (six) hours as needed for wheezing or shortness of breath.   Marland Kitchen amoxicillin-clavulanate (AUGMENTIN) 875-125 MG tablet Take 1 tablet by mouth 2 (two) times daily.   Marland Kitchen aspirin EC 81 MG tablet Take 81 mg by mouth daily. Swallow whole.   . bifidobacterium infantis (ALIGN) capsule Take 1 capsule by mouth daily.    . carvedilol (COREG) 25 MG tablet Take 0.5 tablets (12.5 mg total) by mouth 2 (two) times daily.   . Chlorphen-Phenyleph-ASA (ALKA-SELTZER PLUS COLD PO) Take 1 tablet by mouth daily as needed (congestion).    . Cholecalciferol (VITAMIN D3) 2000 units TABS Take 2,000 Units by mouth at bedtime.   . cyclobenzaprine (FLEXERIL) 10 MG tablet Take one tab po bid prn for back   . diltiazem (TIAZAC) 360 MG 24 hr capsule Take 360 mg by mouth daily.     Marland Kitchen doxazosin (CARDURA) 4 MG tablet Take 1 tablet (4 mg total) by mouth at bedtime.   . Dulaglutide (TRULICITY) 1.5 LF/8.1OF SOPN Inject 1.5 mg into the skin every Wednesday.   . etodolac (LODINE) 400 MG tablet Take 400 mg by mouth 2 (two) times daily.   . fluticasone (FLONASE) 50 MCG/ACT nasal spray Place 2 sprays into both nostrils 2 (two) times daily.    . furosemide (LASIX) 80 MG tablet Take 1 tablet (80 mg total) by mouth 2 (two) times daily.   Marland Kitchen glipiZIDE (GLUCOTROL XL) 10 MG 24 hr tablet Take 1 tablet (10 mg total) by mouth daily.   . Insulin Degludec (TRESIBA FLEXTOUCH) 200 UNIT/ML SOPN    . ipratropium (ATROVENT) 0.02 % nebulizer solution Take 0.5 mg by nebulization every 6 (six) hours as needed for wheezing or shortness of breath.   Marland Kitchen ipratropium (ATROVENT) 0.02 % nebulizer solution Take 0.5 mg by nebulization 4 (four) times daily.   Marland Kitchen lidocaine (LIDODERM) 5 % Place 1 patch onto the skin daily. Remove & Discard patch within 12 hours or as directed by MD   . loratadine (CLARITIN) 10 MG tablet Take 10 mg by mouth daily.  08/24/2018: Takes liqui-gels at home  . metolazone (ZAROXOLYN) 5 MG tablet Take 5 mg (1 tablet) by mouth on Mondays, Wednesdays, and Fridays, about 30 minutes before taking your morning dose of furosemide (Lasix).   Marland Kitchen  multivitamin (ONE-A-DAY MEN'S) TABS tablet Take 1 tablet by mouth daily.   . naproxen (NAPROSYN) 500 MG tablet Take 500 mg by mouth 2 (two) times daily with a meal.   . nitroGLYCERIN (NITROSTAT) 0.4 MG SL tablet Place 1 tablet (0.4 mg total) under the tongue every 5 (five) minutes as needed for chest pain.   . potassium chloride (MICRO-K) 10 MEQ CR capsule Take 1 capsule (10 mEq total) by mouth daily.   . SYMBICORT 160-4.5 MCG/ACT inhaler TAKE 2 PUFFS BY MOUTH TWICE A DAY   . traMADol (ULTRAM) 50 MG tablet Take by mouth every 6 (six) hours as needed.   . [DISCONTINUED] metolazone (ZAROXOLYN) 5 MG tablet Take 5 mg (1 tablet) by mouth on Mondays, Wednesdays, and  Fridays, about 30 minutes before taking your morning dose of furosemide (Lasix).   Marland Kitchen amiodarone (PACERONE) 200 MG tablet Take 1 tablet (200 mg total) by mouth daily for 30 days.   Marland Kitchen amoxicillin-clavulanate (AUGMENTIN) 875-125 MG tablet Take 1 tablet by mouth 2 (two) times daily.    No facility-administered encounter medications on file as of 06/04/2020.    Surgical History: Past Surgical History:  Procedure Laterality Date  . COLONOSCOPY WITH PROPOFOL N/A 11/07/2017   Procedure: COLONOSCOPY WITH PROPOFOL;  Surgeon: [name], MD;  Location: [facility] ENDOSCOPY;  Service: Gastroenterology;  Laterality: N/A;  . ESOPHAGOGASTRODUODENOSCOPY (EGD) WITH PROPOFOL N/A 11/07/2017   Procedure: ESOPHAGOGASTRODUODENOSCOPY (EGD) WITH PROPOFOL;  Surgeon: [name], MD;  Location: [facility] ENDOSCOPY;  Service: Gastroenterology;  Laterality: N/A;  . LEFT HEART CATH AND CORONARY ANGIOGRAPHY N/A 10/23/2017   Procedure: LEFT HEART CATH AND CORONARY ANGIOGRAPHY;  Surgeon: Isaias Cowman, MD;  Location: [facility];  Service: Cardiovascular;  Laterality: N/A;    Medical History: Past Medical History:  Diagnosis Date  . CHF (congestive heart failure) ([place])   . COPD (chronic obstructive pulmonary disease) ([place])   . Diabetes mellitus without complication ([place])   . Edema    leg swelling  . Gastroesophageal reflux disease 12/15/2017  . Hypertension   . Melena 11/05/2017  . Pneumonia 07/27/2016    Family History: Family History  Problem Relation Age of Onset  . Cancer Mother   . CVA Father   . Heart disease Father     Social History   Socioeconomic History  . Marital status: Single    Spouse name: Not on file  . Number of children: Not on file  . Years of education: Not on file  . Highest education level: Not on file  Occupational History  . Not on file  Tobacco Use  . Smoking status: Never Smoker  . Smokeless tobacco: Never Used  Substance and Sexual Activity  . Alcohol use: Yes     Comment: ocassionally  . Drug use: Never  . Sexual activity: Not on file  Other Topics Concern  . Not on file  Social History Narrative  . Not on file   Social Determinants of Health   Financial Resource Strain:   . Difficulty of Paying Living Expenses: Not on file  Food Insecurity:   . Worried About Charity fundraiser in the Last Year: Not on file  . Ran Out of Food in the Last Year: Not on file  Transportation Needs:   . Lack of Transportation (Medical): Not on file  . Lack of Transportation (Non-Medical): Not on file  Physical Activity:   . Days of Exercise per Week: Not on file  . Minutes of Exercise per Session:  Not on file  Stress:   . Feeling of Stress : Not on file  Social Connections:   . Frequency of Communication with Friends and Family: Not on file  . Frequency of Social Gatherings with Friends and Family: Not on file  . Attends Religious Services: Not on file  . Active Member of Clubs or Organizations: Not on file  . Attends Archivist Meetings: Not on file  . Marital Status: Not on file  Intimate Partner Violence:   . Fear of Current or Ex-Partner: Not on file  . Emotionally Abused: Not on file  . Physically Abused: Not on file  . Sexually Abused: Not on file     Review of Systems  Constitutional: Negative for chills, fatigue and unexpected weight change.  HENT: Negative for congestion, postnasal drip, rhinorrhea, sneezing and sore throat.   Eyes: Negative for redness.  Respiratory: Positive for shortness of breath. Negative for cough and chest tightness.   Cardiovascular: Positive for leg swelling. Negative for chest pain and palpitations.  Gastrointestinal: Negative for abdominal pain, constipation, diarrhea, nausea and vomiting.  Genitourinary: Negative for dysuria and frequency.  Musculoskeletal: Negative for arthralgias, back pain, joint swelling and neck pain.  Skin: Negative for rash.  Neurological: Negative.  Negative for tremors and  numbness.  Hematological: Negative for adenopathy. Does not bruise/bleed easily.  Psychiatric/Behavioral: Negative for behavioral problems (Depression), sleep disturbance and suicidal ideas. The patient is not nervous/anxious.     Vital Signs: BP 106/60   Pulse 64   Temp 98 F (36.7 C)   Resp 16   Ht 5\' 8"  (1.727 m)   Wt (!) 305 lb (138.3 kg)   SpO2 90%   BMI 46.38 kg/m    Physical Exam Constitutional:      Appearance: He is obese.  HENT:     Head: Normocephalic and atraumatic.  Eyes:     Extraocular Movements: Extraocular movements intact.     Pupils: Pupils are equal, round, and reactive to light.  Cardiovascular:     Rate and Rhythm: Normal rate. Rhythm irregular.  Pulmonary:     Effort: Pulmonary effort is normal.     Breath sounds: Normal breath sounds.  Musculoskeletal:     Right lower leg: Edema present.     Left lower leg: Edema present.  Skin:    Capillary Refill: Capillary refill takes less than 2 seconds.     Findings: Erythema present.  Neurological:     General: No focal deficit present.     Mental Status: He is alert and oriented to person, place, and time.  Psychiatric:        Mood and Affect: Mood normal.        Behavior: Behavior normal.    Assessment/Plan: 1. Type 2 diabetes mellitus with hyperglycemia, unspecified whether long term insulin use (HCC) Diabetes is under good control, will provide samples of Trulicity, continue other meds as before, will dc glucoterol on next visit if needed   2. Cellulitis and abscess of lower extremity Improved but not resolved, will add one for week of Augmentin   3. Chronic diastolic congestive heart failure (HCC) Pt is to continue on Lasix however advised against excessive use due to CKD - metolazone (ZAROXOLYN) 5 MG tablet; Take 5 mg (1 tablet) by mouth on Mondays, Wednesdays, and Fridays, about 30 minutes before taking your morning dose of furosemide (Lasix).  Dispense: 15 tablet; Refill: 0  4. Essential  hypertension Blood pressure is noted to be on  the low side, will speak with cardiology, go down on coreg or diltiazem   5. Atrial fibrillation, unspecified type ([place]) Heart rate is under good control, but he is not on any anticoagulation for stroke prevention, will need carotid dopplers as well, PUD is 2019  6. Morbid obesity ([place]) Encouraged health heart habist, 1800-2100 ADA   7. Mixed hyperlipidemia He should be on a statin, he is not sure, will check fasting lipid profile   8. Chronic kidney disease (CKD) stage G4/A1, severely decreased glomerular filtration rate (GFR) between 15-29 mL/min/1.73 square meter and albuminuria creatinine ratio less than 30 mg/g (HCC) Pt is encouraged to stop all NSAID, DO NOT overuse diuretics, repeat labs    - metolazone (ZAROXOLYN) 5 MG tablet; Take 5 mg (1 tablet) by mouth on Mondays, Wednesdays, and Fridays, about 30 minutes before taking your morning dose of furosemide (Lasix).  Dispense: 15 tablet; Refill: 0  General Counseling: Malva Cogan understanding of the findings of todays visit and agrees with plan of treatment. I have discussed any further diagnostic evaluation that may be needed or ordered today. We also reviewed his medications today. he has been encouraged to call the office with any questions or concerns that should arise related to todays visit.    Orders Placed This Encounter  Procedures  . CBC with Differential/Platelet  . Lipid Panel With LDL/HDL Ratio  . TSH  . T4, free  . Comprehensive metabolic panel  . Urinalysis  . POCT glycosylated hemoglobin (Hb A1C)    Meds ordered this encounter  Medications  . metolazone (ZAROXOLYN) 5 MG tablet    Sig: Take 5 mg (1 tablet) by mouth on Mondays, Wednesdays, and Fridays, about 30 minutes before taking your morning dose of furosemide (Lasix).    Dispense:  15 tablet    Refill:  0  . amoxicillin-clavulanate (AUGMENTIN) 875-125 MG tablet    Sig: Take 1 tablet by mouth 2 (two)  times daily.    Dispense:  20 tablet    Refill:  0    Total time spent: 35Minutes Time spent includes review of chart, medications, test results, and follow up plan with the patient.      Dr Lavera Guise Internal medicine

## 2020-06-05 ENCOUNTER — Telehealth: Payer: Self-pay

## 2020-06-05 NOTE — Telephone Encounter (Signed)
-----   Message from Lavera Guise, MD sent at 06/04/2020 10:05 PM EDT ----- Please tal to me about his bp

## 2020-06-09 ENCOUNTER — Other Ambulatory Visit: Payer: Self-pay

## 2020-06-09 LAB — CBC WITH DIFFERENTIAL/PLATELET
Basophils Absolute: 0.1 10*3/uL (ref 0.0–0.2)
Basos: 1 %
EOS (ABSOLUTE): 0.4 10*3/uL (ref 0.0–0.4)
Eos: 3 %
Hematocrit: 34.6 % — ABNORMAL LOW (ref 37.5–51.0)
Hemoglobin: 11.7 g/dL — ABNORMAL LOW (ref 13.0–17.7)
Immature Grans (Abs): 0.2 10*3/uL — ABNORMAL HIGH (ref 0.0–0.1)
Immature Granulocytes: 1 %
Lymphocytes Absolute: 1.9 10*3/uL (ref 0.7–3.1)
Lymphs: 14 %
MCH: 29.5 pg (ref 26.6–33.0)
MCHC: 33.8 g/dL (ref 31.5–35.7)
MCV: 87 fL (ref 79–97)
Monocytes Absolute: 1.1 10*3/uL — ABNORMAL HIGH (ref 0.1–0.9)
Monocytes: 8 %
Neutrophils Absolute: 10.3 10*3/uL — ABNORMAL HIGH (ref 1.4–7.0)
Neutrophils: 73 %
Platelets: 342 10*3/uL (ref 150–450)
RBC: 3.97 x10E6/uL — ABNORMAL LOW (ref 4.14–5.80)
RDW: 14.5 % (ref 11.6–15.4)
WBC: 14 10*3/uL — ABNORMAL HIGH (ref 3.4–10.8)

## 2020-06-09 LAB — COMPREHENSIVE METABOLIC PANEL
ALT: 15 IU/L (ref 0–44)
AST: 18 IU/L (ref 0–40)
Albumin/Globulin Ratio: 1 — ABNORMAL LOW (ref 1.2–2.2)
Albumin: 3.9 g/dL (ref 3.8–4.8)
Alkaline Phosphatase: 109 IU/L (ref 44–121)
BUN/Creatinine Ratio: 22 (ref 10–24)
BUN: 57 mg/dL — ABNORMAL HIGH (ref 8–27)
Bilirubin Total: 0.2 mg/dL (ref 0.0–1.2)
CO2: 33 mmol/L — ABNORMAL HIGH (ref 20–29)
Calcium: 9.8 mg/dL (ref 8.6–10.2)
Chloride: 84 mmol/L — ABNORMAL LOW (ref 96–106)
Creatinine, Ser: 2.58 mg/dL — ABNORMAL HIGH (ref 0.76–1.27)
GFR calc Af Amer: 28 mL/min/{1.73_m2} — ABNORMAL LOW (ref >59)
GFR calc non Af Amer: 25 mL/min/{1.73_m2} — ABNORMAL LOW (ref >59)
Globulin, Total: 3.9 g/dL (ref 1.5–4.5)
Glucose: 151 mg/dL — ABNORMAL HIGH (ref 65–99)
Potassium: 3 mmol/L — ABNORMAL LOW (ref 3.5–5.2)
Sodium: 136 mmol/L (ref 134–144)
Total Protein: 7.8 g/dL (ref 6.0–8.5)

## 2020-06-09 LAB — LIPID PANEL WITH LDL/HDL RATIO
Cholesterol, Total: 188 mg/dL (ref 100–199)
HDL: 36 mg/dL — ABNORMAL LOW (ref >39)
LDL Chol Calc (NIH): 117 mg/dL — ABNORMAL HIGH (ref 0–99)
LDL/HDL Ratio: 3.3 ratio (ref 0.0–3.6)
Triglycerides: 198 mg/dL — ABNORMAL HIGH (ref 0–149)
VLDL Cholesterol Cal: 35 mg/dL (ref 5–40)

## 2020-06-09 LAB — TSH: TSH: 28.7 u[IU]/mL — ABNORMAL HIGH (ref 0.450–4.500)

## 2020-06-09 LAB — T4, FREE: Free T4: 0.54 ng/dL — ABNORMAL LOW (ref 0.82–1.77)

## 2020-06-09 MED ORDER — LEVOTHYROXINE SODIUM 50 MCG PO TABS: 50.0000 ug | ORAL_TABLET | Freq: Every day | ORAL | 1 refills | Status: DC

## 2020-06-09 NOTE — Telephone Encounter (Signed)
Per [facility] called and spoke to pt about labs and advised he has a few abnormal labs and DFK wanted him to make appt for Monday which pt spoke to [place].  Also per DFK had pt take 2 potassium 10 MEQ pills today, 2 on Thursday and 2 on Saturday.  Advised to pt that his thyroid levels showed high and we are starting him on synthroid 50 mcg once in the morning before breakfast.  I sent prescription to his pharmacy.  dbs

## 2020-06-16 ENCOUNTER — Other Ambulatory Visit: Payer: Self-pay

## 2020-06-16 ENCOUNTER — Encounter: Payer: Self-pay | Admitting: Internal Medicine

## 2020-06-16 ENCOUNTER — Ambulatory Visit (INDEPENDENT_AMBULATORY_CARE_PROVIDER_SITE_OTHER): Payer: Medicare HMO | Admitting: Internal Medicine

## 2020-06-16 VITALS — BP 134/70 | HR 75 | Temp 97.3°F | Resp 16 | Ht 70.0 in | Wt 309.6 lb

## 2020-06-16 DIAGNOSIS — I5032 Chronic diastolic (congestive) heart failure: Secondary | ICD-10-CM

## 2020-06-16 DIAGNOSIS — G4733 Obstructive sleep apnea (adult) (pediatric): Secondary | ICD-10-CM

## 2020-06-16 DIAGNOSIS — Z794 Long term (current) use of insulin: Secondary | ICD-10-CM | POA: Diagnosis not present

## 2020-06-16 DIAGNOSIS — E1165 Type 2 diabetes mellitus with hyperglycemia: Secondary | ICD-10-CM | POA: Diagnosis not present

## 2020-06-16 DIAGNOSIS — N184 Chronic kidney disease, stage 4 (severe): Secondary | ICD-10-CM

## 2020-06-16 DIAGNOSIS — I4821 Permanent atrial fibrillation: Secondary | ICD-10-CM

## 2020-06-16 DIAGNOSIS — E032 Hypothyroidism due to medicaments and other exogenous substances: Secondary | ICD-10-CM

## 2020-06-16 DIAGNOSIS — Z9989 Dependence on other enabling machines and devices: Secondary | ICD-10-CM

## 2020-06-16 LAB — POCT GLYCOSYLATED HEMOGLOBIN (HGB A1C): Hemoglobin A1C: 7.6 % — AB (ref 4.0–5.6)

## 2020-06-16 MED ORDER — METOLAZONE 5 MG PO TABS: ORAL_TABLET | ORAL | 0 refills | Status: DC

## 2020-06-16 MED ORDER — POTASSIUM CHLORIDE ER 10 MEQ PO CPCR
10.0000 meq | ORAL_CAPSULE | Freq: Every day | ORAL | 1 refills | Status: DC
Start: 2020-06-16 — End: 2021-02-16

## 2020-06-16 NOTE — Progress Notes (Signed)
[facility] [place], [place] [address]  Internal MEDICINE  Office Visit Note  Patient Name: Jason Hudson  [id number]  [id number]  Date of Service: 06/17/2020  Chief Complaint  Patient presents with  . Follow-up    reviewing abnormal labs, thyroid  . Diabetes  . Hypertension  . policy update form    received  . Quality Metric Gaps    PNA    HPI Pt is here for routine follow up. recent labs showed multiple abnormalities. Pt has chronic CHF, low EF on echo , has not seen his cardiologist due to financial issues, has atrial fib, on amiodarone and Cardizem  Heart rate and blood pressure is well controlled, he is not on any anticoagulation due to a GI bleed 2 years ago. His TSH is elevated ( on Amiodarone)  Diabetes is not well controlled either, takes large dose of insulin  On CPAP but does not use it due to pressure not right, Feels more sob    CKD. Takes NSAIDS and on high dose diuretics  Loves to eat, does not watch his diet  Bilateral lower ext edema and cellulitis is improving  Current Medication: Outpatient Encounter Medications as of 06/16/2020  Medication Sig Note  . albuterol (VENTOLIN HFA) 108 (90 Base) MCG/ACT inhaler Inhale 1-2 puffs into the lungs every 6 (six) hours as needed for wheezing or shortness of breath.   Marland Kitchen amiodarone (PACERONE) 200 MG tablet Take 1 tablet (200 mg total) by mouth daily for 30 days.   Marland Kitchen amoxicillin-clavulanate (AUGMENTIN) 875-125 MG tablet Take 1 tablet by mouth 2 (two) times daily.   Marland Kitchen aspirin EC 81 MG tablet Take 81 mg by mouth daily. Swallow whole.   . bifidobacterium infantis (ALIGN) capsule Take 1 capsule by mouth daily.    . Cholecalciferol (VITAMIN D3) 2000 units TABS Take 2,000 Units by mouth at bedtime.   . cyclobenzaprine (FLEXERIL) 10 MG tablet Take one tab po bid prn for back   . diltiazem (TIAZAC) 360 MG 24 hr capsule Take 360 mg by mouth daily.    Marland Kitchen doxazosin (CARDURA) 4 MG tablet Take 1  tablet (4 mg total) by mouth at bedtime.   . Dulaglutide (TRULICITY) 1.5 PI/9.5JO SOPN Inject 1.5 mg into the skin every Wednesday.   . fluticasone (FLONASE) 50 MCG/ACT nasal spray Place 2 sprays into both nostrils 2 (two) times daily.    . furosemide (LASIX) 80 MG tablet Take 1 tablet (80 mg total) by mouth 2 (two) times daily.   Marland Kitchen glipiZIDE (GLUCOTROL XL) 10 MG 24 hr tablet Take 1 tablet (10 mg total) by mouth daily.   . Insulin Degludec (TRESIBA FLEXTOUCH) 200 UNIT/ML SOPN    . ipratropium (ATROVENT) 0.02 % nebulizer solution Take 0.5 mg by nebulization every 6 (six) hours as needed for wheezing or shortness of breath.   Marland Kitchen ipratropium (ATROVENT) 0.02 % nebulizer solution Take 0.5 mg by nebulization 4 (four) times daily.   Marland Kitchen levothyroxine (SYNTHROID) 50 MCG tablet Take 1 tablet (50 mcg total) by mouth daily before breakfast.   . lidocaine (LIDODERM) 5 % Place 1 patch onto the skin daily. Remove & Discard patch within 12 hours or as directed by MD   . loratadine (CLARITIN) 10 MG tablet Take 10 mg by mouth daily.  08/24/2018: Takes liqui-gels at home  . metolazone (ZAROXOLYN) 5 MG tablet Take one tab po qd for swelling   . multivitamin (ONE-A-DAY MEN'S) TABS tablet Take 1 tablet by mouth  daily.   . nitroGLYCERIN (NITROSTAT) 0.4 MG SL tablet Place 1 tablet (0.4 mg total) under the tongue every 5 (five) minutes as needed for chest pain.   . potassium chloride (MICRO-K) 10 MEQ CR capsule Take 1 capsule (10 mEq total) by mouth daily. Take one to to tabs every day   . SYMBICORT 160-4.5 MCG/ACT inhaler TAKE 2 PUFFS BY MOUTH TWICE A DAY   . traMADol (ULTRAM) 50 MG tablet Take by mouth every 6 (six) hours as needed.   . [DISCONTINUED] carvedilol (COREG) 25 MG tablet Take 0.5 tablets (12.5 mg total) by mouth 2 (two) times daily.   . [DISCONTINUED] metolazone (ZAROXOLYN) 5 MG tablet Take 5 mg (1 tablet) by mouth on Mondays, Wednesdays, and Fridays, about 30 minutes before taking your morning dose of furosemide  (Lasix).   . [DISCONTINUED] naproxen (NAPROSYN) 500 MG tablet Take 500 mg by mouth 2 (two) times daily with a meal.   . [DISCONTINUED] potassium chloride (MICRO-K) 10 MEQ CR capsule Take 1 capsule (10 mEq total) by mouth daily.    No facility-administered encounter medications on file as of 06/16/2020.    Surgical History: Past Surgical History:  Procedure Laterality Date  . COLONOSCOPY WITH PROPOFOL N/A 11/07/2017   Procedure: COLONOSCOPY WITH PROPOFOL;  Surgeon: [name], MD;  Location[facility] ENDOSCOPY;  Service: Gastroenterology;  Laterality: N/A;  . ESOPHAGOGASTRODUODENOSCOPY (EGD) WITH PROPOFOL N/A 11/07/2017   Procedure: ESOPHAGOGASTRODUODENOSCOPY (EGD) WITH PROPOFOL;  Surgeon: [name], MD;  Location: [facility] ENDOSCOPY;  Service: Gastroenterology;  Laterality: N/A;  . LEFT HEART CATH AND CORONARY ANGIOGRAPHY N/A 10/23/2017   Procedure: LEFT HEART CATH AND CORONARY ANGIOGRAPHY;  Surgeon: Isaias Cowman, MD;  Location: [facility];  Service: Cardiovascular;  Laterality: N/A;    Medical History: Past Medical History:  Diagnosis Date  . CHF (congestive heart failure) ([place])   . COPD (chronic obstructive pulmonary disease) ([place])   . Diabetes mellitus without complication ([place])   . Edema    leg swelling  . Gastroesophageal reflux disease 12/15/2017  . Hypertension   . Melena 11/05/2017  . Pneumonia 07/27/2016    Family History: Family History  Problem Relation Age of Onset  . Cancer Mother   . CVA Father   . Heart disease Father     Social History   Socioeconomic History  . Marital status: Single    Spouse name: Not on file  . Number of children: Not on file  . Years of education: Not on file  . Highest education level: Not on file  Occupational History  . Not on file  Tobacco Use  . Smoking status: Never Smoker  . Smokeless tobacco: Never Used  Substance and Sexual Activity  . Alcohol use: Yes    Comment: ocassionally  . Drug use: Never  . Sexual  activity: Not on file  Other Topics Concern  . Not on file  Social History Narrative  . Not on file   Social Determinants of Health   Financial Resource Strain:   . Difficulty of Paying Living Expenses: Not on file  Food Insecurity:   . Worried About Charity fundraiser in the Last Year: Not on file  . Ran Out of Food in the Last Year: Not on file  Transportation Needs:   . Lack of Transportation (Medical): Not on file  . Lack of Transportation (Non-Medical): Not on file  Physical Activity:   . Days of Exercise per Week: Not on file  . Minutes of Exercise  per Session: Not on file  Stress:   . Feeling of Stress : Not on file  Social Connections:   . Frequency of Communication with Friends and Family: Not on file  . Frequency of Social Gatherings with Friends and Family: Not on file  . Attends Religious Services: Not on file  . Active Member of Clubs or Organizations: Not on file  . Attends Archivist Meetings: Not on file  . Marital Status: Not on file  Intimate Partner Violence:   . Fear of Current or Ex-Partner: Not on file  . Emotionally Abused: Not on file  . Physically Abused: Not on file  . Sexually Abused: Not on file      Review of Systems  Constitutional: Negative for chills, fatigue and unexpected weight change.  HENT: Negative for congestion, postnasal drip, rhinorrhea, sneezing and sore throat.   Eyes: Negative for redness.  Respiratory: Positive for shortness of breath. Negative for cough and chest tightness.   Cardiovascular: Positive for leg swelling. Negative for chest pain and palpitations.  Gastrointestinal: Negative for abdominal pain, constipation, diarrhea, nausea and vomiting.  Genitourinary: Negative for dysuria and frequency.  Musculoskeletal: Positive for arthralgias and back pain. Negative for joint swelling and neck pain.  Skin: Negative for rash.  Neurological: Negative.  Negative for tremors and numbness.  Hematological: Negative  for adenopathy. Does not bruise/bleed easily.  Psychiatric/Behavioral: Negative for behavioral problems (Depression), sleep disturbance and suicidal ideas. The patient is not nervous/anxious.     Vital Signs: BP 134/70   Pulse 75   Temp (!) 97.3 F (36.3 C)   Resp 16   Ht 5\' 10"  (1.778 m)   Wt (!) 309 lb 9.6 oz (140.4 kg)   SpO2 96%   BMI 44.42 kg/m    Physical Exam Constitutional:      General: He is not in acute distress.    Appearance: He is well-developed. He is not diaphoretic.  HENT:     Head: Normocephalic and atraumatic.     Mouth/Throat:     Pharynx: No oropharyngeal exudate.  Eyes:     Pupils: Pupils are equal, round, and reactive to light.  Neck:     Thyroid: No thyromegaly.     Vascular: No JVD.     Trachea: No tracheal deviation.  Cardiovascular:     Rate and Rhythm: Normal rate and regular rhythm.     Heart sounds: Normal heart sounds. No murmur heard.  No friction rub. No gallop.   Pulmonary:     Effort: Pulmonary effort is normal. No respiratory distress.     Breath sounds: No wheezing or rales.  Chest:     Chest wall: No tenderness.  Abdominal:     General: Bowel sounds are normal.     Palpations: Abdomen is soft.  Musculoskeletal:        General: Swelling and tenderness present. Normal range of motion.     Cervical back: Normal range of motion and neck supple.     Right lower leg: Edema present.     Left lower leg: Edema present.  Lymphadenopathy:     Cervical: No cervical adenopathy.  Skin:    General: Skin is warm and dry.     Findings: Erythema and rash present.  Neurological:     Mental Status: He is alert and oriented to person, place, and time.     Cranial Nerves: No cranial nerve deficit.  Psychiatric:        Behavior: Behavior normal.  Thought Content: Thought content normal.        Judgment: Judgment normal.     Assessment/Plan: 1. Type 2 diabetes mellitus with hyperglycemia, with long-term current use of insulin  ([place]) Continue Trulicity and Tyler Aas, will like to discontinue Glipizide  - POCT HgB A1C  2. Chronic diastolic congestive heart failure (HCC) Low EF on echo, will need recent study, however will like to adjust diuretics ( alsix 80 mg po qd and  - metolazone (ZAROXOLYN) 5 MG tablet; Take one tab po qd for swelling  Dispense: 90 tablet; Refill: 0 - Increase potasium cautiously   3. Chronic kidney disease (CKD) stage G4/A1, severely decreased glomerular filtration rate (GFR) between 15-29 mL/min/1.73 square meter and albuminuria creatinine ratio less than 30 mg/g (HCC) Pt will need to see nephrology .  - metolazone (ZAROXOLYN) 5 MG tablet; Take one tab po qd for swelling  Dispense: 90 tablet; Refill: 0 - Stop all NSAIDs  4. Permanent atrial fibrillation (HCC) Might need to go off of Amiodarone Since developing thyroiditis   5. Hypothyroidism due to medication Synthroid 50 mcg po qd, check TSH and free T4 on next visit   6. OSA on CPAP Will send an order or auto titration, will need Over night pox   General Counseling: Malva Cogan understanding of the findings of todays visit and agrees with plan of treatment. I have discussed any further diagnostic evaluation that may be needed or ordered today. We also reviewed his medications today. he has been encouraged to call the office with any questions or concerns that should arise related to todays visit.    Orders Placed This Encounter  Procedures  . For home use only DME Other see comment  . TSH + free T4  . Pulse oximetry, overnight  . POCT HgB A1C    Meds ordered this encounter  Medications  . potassium chloride (MICRO-K) 10 MEQ CR capsule    Sig: Take 1 capsule (10 mEq total) by mouth daily. Take one to to tabs every day    Dispense:  90 capsule    Refill:  1  . metolazone (ZAROXOLYN) 5 MG tablet    Sig: Take one tab po qd for swelling    Dispense:  90 tablet    Refill:  0    Total time spent: 35  Minutes Time spent  includes review of chart, medications, test results, and follow up plan with the patient.    Dr Lavera Guise Internal medicine

## 2020-06-18 ENCOUNTER — Telehealth: Payer: Self-pay

## 2020-06-18 NOTE — Telephone Encounter (Signed)
Gave [facility] orders for auto set up and overnight ox test. Jason Hudson

## 2020-06-29 ENCOUNTER — Other Ambulatory Visit: Payer: Self-pay

## 2020-06-29 DIAGNOSIS — N184 Chronic kidney disease, stage 4 (severe): Secondary | ICD-10-CM

## 2020-06-29 DIAGNOSIS — I5032 Chronic diastolic (congestive) heart failure: Secondary | ICD-10-CM

## 2020-06-29 MED ORDER — FUROSEMIDE 80 MG PO TABS: 80.0000 mg | ORAL_TABLET | Freq: Two times a day (BID) | ORAL | 1 refills | Status: DC

## 2020-07-02 ENCOUNTER — Ambulatory Visit: Payer: Managed Care, Other (non HMO) | Admitting: Hospice and Palliative Medicine

## 2020-07-06 ENCOUNTER — Ambulatory Visit: Payer: Managed Care, Other (non HMO) | Admitting: Hospice and Palliative Medicine

## 2020-07-07 ENCOUNTER — Other Ambulatory Visit: Payer: Self-pay

## 2020-07-07 ENCOUNTER — Ambulatory Visit (INDEPENDENT_AMBULATORY_CARE_PROVIDER_SITE_OTHER): Payer: Medicare HMO | Admitting: Internal Medicine

## 2020-07-07 ENCOUNTER — Encounter: Payer: Self-pay | Admitting: Internal Medicine

## 2020-07-07 VITALS — BP 130/70 | HR 74 | Temp 97.4°F | Resp 16 | Ht 68.0 in | Wt 304.0 lb

## 2020-07-07 DIAGNOSIS — I4891 Unspecified atrial fibrillation: Secondary | ICD-10-CM

## 2020-07-07 DIAGNOSIS — G4733 Obstructive sleep apnea (adult) (pediatric): Secondary | ICD-10-CM

## 2020-07-07 DIAGNOSIS — I5032 Chronic diastolic (congestive) heart failure: Secondary | ICD-10-CM | POA: Diagnosis not present

## 2020-07-07 NOTE — Patient Instructions (Signed)

## 2020-07-07 NOTE — Progress Notes (Signed)
[facility] [place],  [id number]  Pulmonary Sleep Medicine   Office Visit Note  Patient Name: Jason Hudson DOB: Sep 20, 1950 MRN [id number]  Date of Service: 07/07/2020  Complaints/HPI: COPD Obesity OSA. Patient has been having no issues with the COPD. Patient has been on symbicort however he is not able to afford it. He has albuterol short acting and the nebulizers. PFT last done was in 2020. His CPAP is doing well no issues with it. The new machine he states has not been working as well. Patient states that he has no issues with the old machine. Weight has not been easy to lose for him. He has worked on getting about 5 pounds since last visit  ROS  General: (-) fever, (-) chills, (-) night sweats, (-) weakness Skin: (-) rashes, (-) itching,. Eyes: (-) visual changes, (-) redness, (-) itching. Nose and Sinuses: (-) nasal stuffiness or itchiness, (-) postnasal drip, (-) nosebleeds, (-) sinus trouble. Mouth and Throat: (-) sore throat, (-) hoarseness. Neck: (-) swollen glands, (-) enlarged thyroid, (-) neck pain. Respiratory: - cough, (-) bloody sputum, - shortness of breath, - wheezing. Cardiovascular: - ankle swelling, (-) chest pain. Lymphatic: (-) lymph node enlargement. Neurologic: (-) numbness, (-) tingling. Psychiatric: (-) anxiety, (-) depression   Current Medication: Outpatient Encounter Medications as of 07/07/2020  Medication Sig Note  . albuterol (VENTOLIN HFA) 108 (90 Base) MCG/ACT inhaler Inhale 1-2 puffs into the lungs every 6 (six) hours as needed for wheezing or shortness of breath.   Marland Kitchen amoxicillin-clavulanate (AUGMENTIN) 875-125 MG tablet Take 1 tablet by mouth 2 (two) times daily.   Marland Kitchen aspirin EC 81 MG tablet Take 81 mg by mouth daily. Swallow whole.   . bifidobacterium infantis (ALIGN) capsule Take 1 capsule by mouth daily.    . Cholecalciferol (VITAMIN D3) 2000 units TABS Take 2,000 Units by mouth at bedtime.   .  cyclobenzaprine (FLEXERIL) 10 MG tablet Take one tab po bid prn for back   . diltiazem (TIAZAC) 360 MG 24 hr capsule Take 360 mg by mouth daily.    Marland Kitchen doxazosin (CARDURA) 4 MG tablet Take 1 tablet (4 mg total) by mouth at bedtime.   . Dulaglutide (TRULICITY) 1.5 CV/8.9FY SOPN Inject 1.5 mg into the skin every Wednesday.   . fluticasone (FLONASE) 50 MCG/ACT nasal spray Place 2 sprays into both nostrils 2 (two) times daily.    . furosemide (LASIX) 80 MG tablet Take 1 tablet (80 mg total) by mouth 2 (two) times daily.   Marland Kitchen glipiZIDE (GLUCOTROL XL) 10 MG 24 hr tablet Take 1 tablet (10 mg total) by mouth daily.   . Insulin Degludec (TRESIBA FLEXTOUCH) 200 UNIT/ML SOPN    . ipratropium (ATROVENT) 0.02 % nebulizer solution Take 0.5 mg by nebulization every 6 (six) hours as needed for wheezing or shortness of breath.   Marland Kitchen ipratropium (ATROVENT) 0.02 % nebulizer solution Take 0.5 mg by nebulization 4 (four) times daily.   Marland Kitchen levothyroxine (SYNTHROID) 50 MCG tablet Take 1 tablet (50 mcg total) by mouth daily before breakfast.   . lidocaine (LIDODERM) 5 % Place 1 patch onto the skin daily. Remove & Discard patch within 12 hours or as directed by MD   . loratadine (CLARITIN) 10 MG tablet Take 10 mg by mouth daily.  08/24/2018: Takes liqui-gels at home  . metolazone (ZAROXOLYN) 5 MG tablet Take one tab po qd for swelling   . multivitamin (ONE-A-DAY MEN'S) TABS tablet Take 1 tablet by mouth  daily.   . nitroGLYCERIN (NITROSTAT) 0.4 MG SL tablet Place 1 tablet (0.4 mg total) under the tongue every 5 (five) minutes as needed for chest pain.   . potassium chloride (MICRO-K) 10 MEQ CR capsule Take 1 capsule (10 mEq total) by mouth daily. Take one to to tabs every day   . SYMBICORT 160-4.5 MCG/ACT inhaler TAKE 2 PUFFS BY MOUTH TWICE A DAY   . traMADol (ULTRAM) 50 MG tablet Take by mouth every 6 (six) hours as needed.   Marland Kitchen amiodarone (PACERONE) 200 MG tablet Take 1 tablet (200 mg total) by mouth daily for 30 days.    No  facility-administered encounter medications on file as of 07/07/2020.    Surgical History: Past Surgical History:  Procedure Laterality Date  . COLONOSCOPY WITH PROPOFOL N/A 11/07/2017   Procedure: COLONOSCOPY WITH PROPOFOL;  Surgeon: [name], MD;  Location: [facility] ENDOSCOPY;  Service: Gastroenterology;  Laterality: N/A;  . ESOPHAGOGASTRODUODENOSCOPY (EGD) WITH PROPOFOL N/A 11/07/2017   Procedure: ESOPHAGOGASTRODUODENOSCOPY (EGD) WITH PROPOFOL;  Surgeon: [name], MD;  Location: [facility] ENDOSCOPY;  Service: Gastroenterology;  Laterality: N/A;  . LEFT HEART CATH AND CORONARY ANGIOGRAPHY N/A 10/23/2017   Procedure: LEFT HEART CATH AND CORONARY ANGIOGRAPHY;  Surgeon: Isaias Cowman, MD;  Location: [place] CV LAB;  Service: Cardiovascular;  Laterality: N/A;    Medical History: Past Medical History:  Diagnosis Date  . CHF (congestive heart failure) ([place])   . COPD (chronic obstructive pulmonary disease) ([place])   . Diabetes mellitus without complication ([place])   . Edema    leg swelling  . Gastroesophageal reflux disease 12/15/2017  . Hypertension   . Melena 11/05/2017  . Pneumonia 07/27/2016    Family History: Family History  Problem Relation Age of Onset  . Cancer Mother   . CVA Father   . Heart disease Father     Social History: Social History   Socioeconomic History  . Marital status: Single    Spouse name: Not on file  . Number of children: Not on file  . Years of education: Not on file  . Highest education level: Not on file  Occupational History  . Not on file  Tobacco Use  . Smoking status: Never Smoker  . Smokeless tobacco: Never Used  Substance and Sexual Activity  . Alcohol use: Yes    Comment: ocassionally  . Drug use: Never  . Sexual activity: Not on file  Other Topics Concern  . Not on file  Social History Narrative  . Not on file   Social Determinants of Health   Financial Resource Strain:   . Difficulty of Paying Living Expenses: Not on file   Food Insecurity:   . Worried About Charity fundraiser in the Last Year: Not on file  . Ran Out of Food in the Last Year: Not on file  Transportation Needs:   . Lack of Transportation (Medical): Not on file  . Lack of Transportation (Non-Medical): Not on file  Physical Activity:   . Days of Exercise per Week: Not on file  . Minutes of Exercise per Session: Not on file  Stress:   . Feeling of Stress : Not on file  Social Connections:   . Frequency of Communication with Friends and Family: Not on file  . Frequency of Social Gatherings with Friends and Family: Not on file  . Attends Religious Services: Not on file  . Active Member of Clubs or Organizations: Not on file  . Attends Archivist Meetings:  Not on file  . Marital Status: Not on file  Intimate Partner Violence:   . Fear of Current or Ex-Partner: Not on file  . Emotionally Abused: Not on file  . Physically Abused: Not on file  . Sexually Abused: Not on file    Vital Signs: Blood pressure 130/70, pulse 74, temperature (!) 97.4 F (36.3 C), resp. rate 16, height _0  (1.727 m), weight (!) 304 lb (137.9 kg), SpO2 94 %.  Examination: General Appearance: The patient is well-developed, well-nourished, and in no distress. Skin: Gross inspection of skin unremarkable. Head: normocephalic, no gross deformities. Eyes: no gross deformities noted. ENT: ears appear grossly normal no exudates. Neck: Supple. No thyromegaly. No LAD. Respiratory: no rhonchi noted a tthis time. Cardiovascular: Normal S1 and S2 without murmur or rub. Extremities: No cyanosis. pulses are equal. Neurologic: Alert and oriented. No involuntary movements.  LABS: Recent Results (from the past 2160 hour(s))  POCT glycosylated hemoglobin (Hb A1C)     Status: Abnormal   Collection Time: 06/04/20  5:05 PM  Result Value Ref Range   Hemoglobin A1C 7.1 (A) 4.0 - 5.6 %   HbA1c POC (<> result, manual entry)     HbA1c, POC (prediabetic range)      HbA1c, POC (controlled diabetic range)    CBC with Differential/Platelet     Status: Abnormal   Collection Time: 06/08/20  2:18 PM  Result Value Ref Range   WBC 14.0 (H) 3.4 - 10.8 x10E3/uL   RBC 3.97 (L) 4.14 - 5.80 x10E6/uL   Hemoglobin 11.7 (L) 13.0 - 17.7 g/dL   Hematocrit 34.6 (L) 37.5 - 51.0 %   MCV 87 79 - 97 fL   MCH 29.5 26.6 - 33.0 pg   MCHC 33.8 31 - 35 g/dL   RDW 14.5 11.6 - 15.4 %   Platelets 342 150 - 450 x10E3/uL   Neutrophils 73 Not Estab. %   Lymphs 14 Not Estab. %   Monocytes 8 Not Estab. %   Eos 3 Not Estab. %   Basos 1 Not Estab. %   Neutrophils Absolute 10.3 (H) 1.40 - 7.00 x10E3/uL   Lymphocytes Absolute 1.9 0 - 3 x10E3/uL   Monocytes Absolute 1.1 (H) 0 - 0 x10E3/uL   EOS (ABSOLUTE) 0.4 0.0 - 0.4 x10E3/uL   Basophils Absolute 0.1 0 - 0 x10E3/uL   Immature Granulocytes 1 Not Estab. %   Immature Grans (Abs) 0.2 (H) 0.0 - 0.1 x10E3/uL    Comment: (An elevated percentage of Immature Granulocytes has not been found to be clinically significant as a sole clinical predictor of disease. Does NOT include bands or blast cells.  Pregnancy associated physiological leukocytosis may also show increased immature granulocytes without clinical significance.)   Lipid Panel With LDL/HDL Ratio     Status: Abnormal   Collection Time: 06/08/20  2:18 PM  Result Value Ref Range   Cholesterol, Total 188 100 - 199 mg/dL   Triglycerides 198 (H) 0 - 149 mg/dL   HDL 36 (L) >39 mg/dL   VLDL Cholesterol Cal 35 5 - 40 mg/dL   LDL Chol Calc (NIH) 117 (H) 0 - 99 mg/dL   LDL/HDL Ratio 3.3 0.0 - 3.6 ratio    Comment:                                     LDL/HDL Ratio  Men  Women                               1/2 Avg.Risk  1.0    1.5                                   Avg.Risk  3.6    3.2                                2X Avg.Risk  6.2    5.0                                3X Avg.Risk  8.0    6.1   TSH     Status: Abnormal   Collection Time:  06/08/20  2:18 PM  Result Value Ref Range   TSH 28.700 (H) 0.450 - 4.500 uIU/mL  T4, free     Status: Abnormal   Collection Time: 06/08/20  2:18 PM  Result Value Ref Range   Free T4 0.54 (L) 0.82 - 1.77 ng/dL  Comprehensive metabolic panel     Status: Abnormal   Collection Time: 06/08/20  2:18 PM  Result Value Ref Range   Glucose 151 (H) 65 - 99 mg/dL   BUN 57 (H) 8 - 27 mg/dL   Creatinine, Ser 2.58 (H) 0.76 - 1.27 mg/dL   GFR calc non Af Amer 25 (L) >59 mL/min/1.73   GFR calc Af Amer 28 (L) >59 mL/min/1.73    Comment: **In accordance with recommendations from the NKF-ASN Task force,**   [facility] is in the process of updating its eGFR calculation to the   2021 CKD-EPI creatinine equation that estimates kidney function   without a race variable.    BUN/Creatinine Ratio 22 10 - 24   Sodium 136 134 - 144 mmol/L   Potassium 3.0 (L) 3.5 - 5.2 mmol/L   Chloride 84 (L) 96 - 106 mmol/L   CO2 33 (H) 20 - 29 mmol/L   Calcium 9.8 8.6 - 10.2 mg/dL   Total Protein 7.8 6.0 - 8.5 g/dL   Albumin 3.9 3.8 - 4.8 g/dL   Globulin, Total 3.9 1.5 - 4.5 g/dL   Albumin/Globulin Ratio 1.0 (L) 1.2 - 2.2   Bilirubin Total 0.2 0.0 - 1.2 mg/dL   Alkaline Phosphatase 109 44 - 121 IU/L    Comment:               **Please note reference interval change**   AST 18 0 - 40 IU/L   ALT 15 0 - 44 IU/L  POCT HgB A1C     Status: Abnormal   Collection Time: 06/16/20 12:13 PM  Result Value Ref Range   Hemoglobin A1C 7.6 (A) 4.0 - 5.6 %   HbA1c POC (<> result, manual entry)     HbA1c, POC (prediabetic range)     HbA1c, POC (controlled diabetic range)      Radiology: DG Lumbar Spine Complete  Result Date: 02/15/2020 CLINICAL DATA:  Severe LOWER back pain radiating into the hips. No known injury. EXAM: LUMBAR SPINE - COMPLETE 4+ VIEW COMPARISON:  10/06/2007 FINDINGS: There is convex LEFT scoliosis of the LOWER lumbar spine, associated with significant degenerative changes. Degenerative changes have progressed since  the previous abdominal plain film. There is anterolisthesis of L4 on L5 which measures 1.2 centimeters. There is loss of vertebral height of L3, consistent with remote compression fracture. IMPRESSION: 1. Significant degenerative changes, progressed since prior plain film. 2. Grade 1 anterolisthesis of L4 on L5. Electronically Signed   By: Nolon Nations M.D.   On: 02/15/2020 07:35   DG Hip Unilat W OR W/O Pelvis 2-3 Views Left  Result Date: 02/15/2020 CLINICAL DATA:  Bilateral hip pain.  Difficulty walking. EXAM: DG HIP (WITH OR WITHOUT PELVIS) 2-3V LEFT COMPARISON:  10/06/2007 FINDINGS: There are mild degenerative changes in both, LEFT greater than RIGHT. Marked degenerative changes are identified in the LOWER lumbar spine, associated with scoliosis convex to the LEFT. Significant bilateral SI joint sclerosis. No acute fracture. IMPRESSION: 1. Degenerative changes. 2. No evidence for acute abnormality. Electronically Signed   By: Nolon Nations M.D.   On: 02/15/2020 07:36   DG Hip Unilat W OR W/O Pelvis 2-3 Views Right  Result Date: 02/15/2020 CLINICAL DATA:  Bilateral hip pain.  Difficulty walking. EXAM: DG HIP (WITH OR WITHOUT PELVIS) 2-3V RIGHT COMPARISON:  10/06/2007 FINDINGS: Degenerative changes seen in both hips, LEFT greater than RIGHT. Significant degenerative changes the LOWER lumbar spine and bilateral SI joints. There is no acute fracture or subluxation. IMPRESSION: Degenerative changes. Electronically Signed   By: Nolon Nations M.D.   On: 02/15/2020 07:37    No results found.  No results found.    Assessment and Plan: Patient Active Problem List   Diagnosis Date Noted  . Low back pain due to bilateral sciatica 02/19/2020  . Encounter for general adult medical examination with abnormal findings 08/31/2019  . Flu vaccine need 08/31/2019  . Need for vaccination against Streptococcus pneumoniae using pneumococcal conjugate vaccine 13 08/31/2019  . Dysuria 08/31/2019  .  Irritable bowel syndrome with constipation 05/29/2019  . Type 2 diabetes mellitus with hyperglycemia ([place]) 03/17/2019  . Lower extremity edema 12/08/2018  . Diabetic ulcer of calf ([place]) 11/10/2018  . Restrictive lung disease 10/26/2018  . Acute exacerbation of CHF (congestive heart failure) ([place]) 09/12/2018  . Acute on chronic systolic congestive heart failure ([place]) 09/06/2018  . AKI (acute kidney injury) ([place]) 09/06/2018  . Moderate intermittent asthma without complication 75/64/3329  . Coronary arteriosclerosis 08/30/2018  . Diastolic dysfunction 51/88/4166  . Hypercholesterolemia 08/30/2018  . Obesity 08/30/2018  . COPD exacerbation ([place]) 08/24/2018  . Hyperlipidemia due to type 2 diabetes mellitus ([place]) 03/28/2018  . Type 2 diabetes mellitus with chronic kidney disease, with long-term current use of insulin ([place]) 03/28/2018  . Essential hypertension 01/19/2018  . Chronic obstructive lung disease ([place]) 12/15/2017  . Gastroesophageal reflux disease 12/15/2017  . Obstructive sleep apnea of adult 12/15/2017  . Melena 11/05/2017  . Congestive heart failure ([place]) 10/20/2017  . Pneumonia 07/27/2016    1. OSA continue with current management on PAP therapy.  Seems to be doing well.  Discussed proper cleaning of the machine.  Patient will continue on the current pressures 2. Obesity weight loss discussed his BMI is 46.2 with 304 pounds needs to get more active with exercise and dietary management 3. ASthma/COPD under control right now he is on inhalers as described above he is not afford the Symbicort and so therefore has not been using it.  He states that he is going to be changing his insurance because of the donut hole issues 4. CHF/ A fib rate is controlled and appears to be compensated as far as  heart failure is concerned he will continue with medical management.  General Counseling: I have discussed the findings of the evaluation and examination with Jason Hudson.  I have also discussed any  further diagnostic evaluation thatmay be needed or ordered today. Jason Hudson verbalizes understanding of the findings of todays visit. We also reviewed his medications today and discussed drug interactions and side effects including but not limited excessive drowsiness and altered mental states. We also discussed that there is always a risk not just to him but also people around him. he has been encouraged to call the office with any questions or concerns that should arise related to todays visit.  No orders of the defined types were placed in this encounter.    Time spent: 28  I have personally obtained a history, examined the patient, evaluated laboratory and imaging results, formulated the assessment and plan and placed orders.    Allyne Gee, MD [facility] Pulmonary and Critical Care Sleep medicine

## 2020-07-21 ENCOUNTER — Other Ambulatory Visit: Payer: Self-pay | Admitting: Hospice and Palliative Medicine

## 2020-07-21 DIAGNOSIS — I5032 Chronic diastolic (congestive) heart failure: Secondary | ICD-10-CM

## 2020-07-21 DIAGNOSIS — N184 Chronic kidney disease, stage 4 (severe): Secondary | ICD-10-CM

## 2020-07-28 ENCOUNTER — Encounter: Payer: Self-pay | Admitting: Intensive Care

## 2020-07-28 ENCOUNTER — Emergency Department
Admission: EM | Admit: 2020-07-28 | Discharge: 2020-07-28 | Disposition: A | Discharge status: Home / Self Care | Payer: Medicare HMO | Attending: Emergency Medicine | Admitting: Emergency Medicine

## 2020-07-28 ENCOUNTER — Other Ambulatory Visit: Payer: Self-pay

## 2020-07-28 DIAGNOSIS — N189 Chronic kidney disease, unspecified: Secondary | ICD-10-CM | POA: Diagnosis not present

## 2020-07-28 DIAGNOSIS — J441 Chronic obstructive pulmonary disease with (acute) exacerbation: Secondary | ICD-10-CM | POA: Insufficient documentation

## 2020-07-28 DIAGNOSIS — I130 Hypertensive heart and chronic kidney disease with heart failure and stage 1 through stage 4 chronic kidney disease, or unspecified chronic kidney disease: Secondary | ICD-10-CM | POA: Diagnosis not present

## 2020-07-28 DIAGNOSIS — Z79899 Other long term (current) drug therapy: Secondary | ICD-10-CM | POA: Insufficient documentation

## 2020-07-28 DIAGNOSIS — W228XXA Striking against or struck by other objects, initial encounter: Secondary | ICD-10-CM | POA: Insufficient documentation

## 2020-07-28 DIAGNOSIS — H2102 Hyphema, left eye: Secondary | ICD-10-CM

## 2020-07-28 DIAGNOSIS — E1165 Type 2 diabetes mellitus with hyperglycemia: Secondary | ICD-10-CM | POA: Diagnosis not present

## 2020-07-28 DIAGNOSIS — E1122 Type 2 diabetes mellitus with diabetic chronic kidney disease: Secondary | ICD-10-CM | POA: Insufficient documentation

## 2020-07-28 DIAGNOSIS — Z794 Long term (current) use of insulin: Secondary | ICD-10-CM | POA: Insufficient documentation

## 2020-07-28 DIAGNOSIS — S0592XA Unspecified injury of left eye and orbit, initial encounter: Secondary | ICD-10-CM | POA: Insufficient documentation

## 2020-07-28 DIAGNOSIS — I5023 Acute on chronic systolic (congestive) heart failure: Secondary | ICD-10-CM | POA: Diagnosis not present

## 2020-07-28 DIAGNOSIS — Z955 Presence of coronary angioplasty implant and graft: Secondary | ICD-10-CM | POA: Diagnosis not present

## 2020-07-28 DIAGNOSIS — Z7951 Long term (current) use of inhaled steroids: Secondary | ICD-10-CM | POA: Insufficient documentation

## 2020-07-28 DIAGNOSIS — Z7984 Long term (current) use of oral hypoglycemic drugs: Secondary | ICD-10-CM | POA: Diagnosis not present

## 2020-07-28 DIAGNOSIS — Z7982 Long term (current) use of aspirin: Secondary | ICD-10-CM | POA: Insufficient documentation

## 2020-07-28 MED ORDER — TETRACAINE HCL 0.5 % OP SOLN
2.0000 [drp] | Freq: Once | OPHTHALMIC | Status: AC
  Administered 2020-07-28: 2 [drp] via OPHTHALMIC
  Filled 2020-07-28: qty 4

## 2020-07-28 MED ORDER — FLUORESCEIN SODIUM 1 MG OP STRP
1.0000 | ORAL_STRIP | Freq: Once | OPHTHALMIC | Status: AC
  Administered 2020-07-28: 1 via OPHTHALMIC
  Filled 2020-07-28: qty 1

## 2020-07-28 NOTE — ED Notes (Signed)
Pt to be taken to [facility] by courtesy car. Pt taken to lobby and first nurse aware of pt needing to be picked up.

## 2020-07-28 NOTE — ED Provider Notes (Signed)
[facility] Emergency Department Provider Note  ____________________________________________  Time seen: Approximately 10:52 AM  I have reviewed the triage vital signs and the nursing notes.   HISTORY  Chief Complaint Eye Injury (left)    HPI Jason Hudson is a 69 y.o. male presents to emergency department for evaluation of eye injury. Patient states that he was changing the rabbit water ball this morning when the spring came back and hit him in the left eye. He immediately noticed decreased vision. He states that his vision has mildly improved while in the waiting room. When he arrived, he was unable to see anything but now he is able to make out some shapes. He does wear glasses. No contacts.  Past Medical History:  Diagnosis Date  . CHF (congestive heart failure) ([place])   . COPD (chronic obstructive pulmonary disease) ([place])   . Diabetes mellitus without complication ([place])   . Edema    leg swelling  . Gastroesophageal reflux disease 12/15/2017  . Hypertension   . Melena 11/05/2017  . Pneumonia 07/27/2016    Patient Active Problem List   Diagnosis Date Noted  . Low back pain due to bilateral sciatica 02/19/2020  . Encounter for general adult medical examination with abnormal findings 08/31/2019  . Flu vaccine need 08/31/2019  . Need for vaccination against Streptococcus pneumoniae using pneumococcal conjugate vaccine 13 08/31/2019  . Dysuria 08/31/2019  . Irritable bowel syndrome with constipation 05/29/2019  . Type 2 diabetes mellitus with hyperglycemia ([place]) 03/17/2019  . Lower extremity edema 12/08/2018  . Diabetic ulcer of calf ([place]) 11/10/2018  . Restrictive lung disease 10/26/2018  . Acute exacerbation of CHF (congestive heart failure) ([place]) 09/12/2018  . Acute on chronic systolic congestive heart failure ([place]) 09/06/2018  . AKI (acute kidney injury) ([place]) 09/06/2018  . Moderate intermittent asthma without complication 26/83/4196  .  Coronary arteriosclerosis 08/30/2018  . Diastolic dysfunction 22/29/7989  . Hypercholesterolemia 08/30/2018  . Obesity 08/30/2018  . COPD exacerbation ([place]) 08/24/2018  . Hyperlipidemia due to type 2 diabetes mellitus ([place]) 03/28/2018  . Type 2 diabetes mellitus with chronic kidney disease, with long-term current use of insulin ([place]) 03/28/2018  . Essential hypertension 01/19/2018  . Chronic obstructive lung disease ([place]) 12/15/2017  . Gastroesophageal reflux disease 12/15/2017  . Obstructive sleep apnea of adult 12/15/2017  . Melena 11/05/2017  . Congestive heart failure ([place]) 10/20/2017  . Pneumonia 07/27/2016    Past Surgical History:  Procedure Laterality Date  . COLONOSCOPY WITH PROPOFOL N/A 11/07/2017   Procedure: COLONOSCOPY WITH PROPOFOL;  Surgeon: [name], MD;  Location: [facility] ENDOSCOPY;  Service: Gastroenterology;  Laterality: N/A;  . ESOPHAGOGASTRODUODENOSCOPY (EGD) WITH PROPOFOL N/A 11/07/2017   Procedure: ESOPHAGOGASTRODUODENOSCOPY (EGD) WITH PROPOFOL;  Surgeon: [name], MD;  Location: [facility] ENDOSCOPY;  Service: Gastroenterology;  Laterality: N/A;  . LEFT HEART CATH AND CORONARY ANGIOGRAPHY N/A 10/23/2017   Procedure: LEFT HEART CATH AND CORONARY ANGIOGRAPHY;  Surgeon: Isaias Cowman, MD;  Location: [place] CV LAB;  Service: Cardiovascular;  Laterality: N/A;    Prior to Admission medications   Medication Sig Start Date End Date Taking? Authorizing Provider  albuterol (VENTOLIN HFA) 108 (90 Base) MCG/ACT inhaler Inhale 1-2 puffs into the lungs every 6 (six) hours as needed for wheezing or shortness of breath. 12/07/18   Ronnell Freshwater, NP  amiodarone (PACERONE) 200 MG tablet Take 1 tablet (200 mg total) by mouth daily for 30 days. 09/15/18 10/15/18  Saundra Shelling, MD  amoxicillin-clavulanate (AUGMENTIN) 875-125 MG tablet Take  1 tablet by mouth 2 (two) times daily. 06/04/20   Lavera Guise, MD  aspirin EC 81 MG tablet Take 81 mg by mouth daily. Swallow whole.     [provider]  bifidobacterium infantis (ALIGN) capsule Take 1 capsule by mouth daily.     [provider]  Cholecalciferol (VITAMIN D3) 2000 units TABS Take 2,000 Units by mouth at bedtime.    [provider]  cyclobenzaprine (FLEXERIL) 10 MG tablet Take one tab po bid prn for back 05/26/20   Lavera Guise, MD  diltiazem [facility]) 360 MG 24 hr capsule Take 360 mg by mouth daily.     [provider]  doxazosin (CARDURA) 4 MG tablet Take 1 tablet (4 mg total) by mouth at bedtime. 05/14/20   Ronnell Freshwater, NP  Dulaglutide (TRULICITY) 1.5 LZ/7.6BH SOPN Inject 1.5 mg into the skin every Wednesday. 10/31/18   Ronnell Freshwater, NP  fluticasone (FLONASE) 50 MCG/ACT nasal spray Place 2 sprays into both nostrils 2 (two) times daily.     [provider]  furosemide (LASIX) 80 MG tablet TAKE 1 TABLET BY MOUTH 2 TIMES DAILY. 07/21/20   Luiz Ochoa, NP  glipiZIDE (GLUCOTROL XL) 10 MG 24 hr tablet Take 1 tablet (10 mg total) by mouth daily. 08/20/19   Ronnell Freshwater, NP  Insulin Degludec (TRESIBA FLEXTOUCH) 200 UNIT/ML SOPN  03/05/19   [provider]  ipratropium (ATROVENT) 0.02 % nebulizer solution Take 0.5 mg by nebulization every 6 (six) hours as needed for wheezing or shortness of breath.    [provider]  ipratropium (ATROVENT) 0.02 % nebulizer solution Take 0.5 mg by nebulization 4 (four) times daily.    [provider]  levothyroxine (SYNTHROID) 50 MCG tablet Take 1 tablet (50 mcg total) by mouth daily before breakfast. 06/09/20   Lavera Guise, MD  lidocaine (LIDODERM) 5 % Place 1 patch onto the skin daily. Remove & Discard patch within 12 hours or as directed by MD 02/13/20   Ronnell Freshwater, NP  loratadine (CLARITIN) 10 MG tablet Take 10 mg by mouth daily.     [provider]  metolazone (ZAROXOLYN) 5 MG tablet Take one tab po qd for swelling 06/16/20   Lavera Guise, MD  multivitamin (ONE-A-DAY MEN'S) TABS  tablet Take 1 tablet by mouth daily.    [provider]  nitroGLYCERIN (NITROSTAT) 0.4 MG SL tablet Place 1 tablet (0.4 mg total) under the tongue every 5 (five) minutes as needed for chest pain. 10/23/17   Bettey Costa, MD  potassium chloride (MICRO-K) 10 MEQ CR capsule Take 1 capsule (10 mEq total) by mouth daily. Take one to to tabs every day 06/16/20   Lavera Guise, MD  [facility] 160-4.5 MCG/ACT inhaler TAKE 2 PUFFS BY MOUTH TWICE A DAY 03/04/20   Allyne Gee, MD  traMADol (ULTRAM) 50 MG tablet Take by mouth every 6 (six) hours as needed.    [provider]    Allergies Red dye, Atorvastatin, and Atorvastatin calcium  Family History  Problem Relation Age of Onset  . Cancer Mother   . CVA Father   . Heart disease Father     Social History Social History   Tobacco Use  . Smoking status: Never Smoker  . Smokeless tobacco: Never Used  Substance Use Topics  . Alcohol use: Yes    Comment: ocassionally  . Drug use: Never     Review of Systems  Cardiovascular: No  chest pain. Respiratory: No SOB. Gastrointestinal: No abdominal pain.  No nausea, no vomiting.  Musculoskeletal: Negative for musculoskeletal pain. Skin: Negative for rash, abrasions, lacerations, ecchymosis. Neurological: Negative for headaches   ____________________________________________   PHYSICAL EXAM:  VITAL SIGNS: ED Triage Vitals [07/28/20 0735]  Enc Vitals Group     BP 138/66     Pulse Rate 79     Resp 16     Temp 98.7 F (37.1 C)     Temp Source Oral     SpO2 94 %     Weight 300 lb (136.1 kg)     Height 5\' 8"  (1.727 m)     Head Circumference      Peak Flow      Pain Score 0     Pain Loc      Pain Edu?      Excl. in [place]?      Constitutional: Alert and oriented. Well appearing and in no acute distress. Eyes: Conjunctivae are normal. PERRL.  No corneal abrasion on fluorescein stain.  Left hyphema to 1/4 inferior iris.   Visual Acuity  Right Eye Distance: 20/80 Left  Eye Distance:  (unable to make out even shapes at distances) Bilateral Distance:    Right Eye Near:   Left Eye Near:    Bilateral Near:      Head: Atraumatic. ENT:      Ears:      Nose: No congestion/rhinnorhea.      Mouth/Throat: Mucous membranes are moist.  Neck: No stridor.   Cardiovascular: Normal rate, regular rhythm.  Good peripheral circulation. Respiratory: Normal respiratory effort without tachypnea or retractions. Lungs CTAB. Good air entry to the bases with no decreased or absent breath sounds. Musculoskeletal: Full range of motion to all extremities. No gross deformities appreciated. Neurologic:  Normal speech and language. No gross focal neurologic deficits are appreciated.  Skin:  Skin is warm, dry and intact. No rash noted. Psychiatric: Mood and affect are normal. Speech and behavior are normal. Patient exhibits appropriate insight and judgement.   ____________________________________________   LABS (all labs ordered are listed, but only abnormal results are displayed)  Labs Reviewed - No data to display ____________________________________________  EKG   ____________________________________________  RADIOLOGY  No results found.  ____________________________________________    PROCEDURES  Procedure(s) performed:    Procedures    Medications  tetracaine (PONTOCAINE) 0.5 % ophthalmic solution 2 drop (2 drops Left Eye Given by Other 07/28/20 1100)  fluorescein ophthalmic strip 1 strip (1 strip Left Eye Given by Other 07/28/20 1100)     ____________________________________________   INITIAL IMPRESSION / ASSESSMENT AND PLAN / ED COURSE  Pertinent labs & imaging results that were available during my care of the patient were reviewed by me and considered in my medical decision making (see chart for details).  Review of the [place] CSRS was performed in accordance of the [place] prior to dispensing any controlled drugs.   Patient presented to the  emergency department for evaluation of eye trauma.  Due to low staffing and high patient volumes, patient waited in the emergency department waiting room for about 3 hours before being taken to a room for evaluation.  Patient has a hyphema on visual examination.  He is only able to make out shapes with his left eye but is unable to see any further detail.  Dr. Wallace Going was consulted right away and would like to see patient in his office now.  Our courtesy shuttle has picked up patient  to take him to [facility] now.  Alastair Hennes was evaluated in Emergency Department on 07/28/2020 for the symptoms described in the history of present illness. He was evaluated in the context of the global COVID-19 pandemic, which necessitated consideration that the patient might be at risk for infection with the SARS-CoV-2 virus that causes COVID-19. Institutional protocols and algorithms that pertain to the evaluation of patients at risk for COVID-19 are in a state of rapid change based on information released by regulatory bodies including the CDC and federal and state organizations. These policies and algorithms were followed during the patient's care in the ED.   ____________________________________________  FINAL CLINICAL IMPRESSION(S) / ED DIAGNOSES  Final diagnoses:  Hyphema of left eye  Left eye injury, initial encounter      NEW MEDICATIONS STARTED DURING THIS VISIT:  ED Discharge Orders    None          This chart was dictated using voice recognition software/Dragon. Despite best efforts to proofread, errors can occur which can change the meaning. Any change was purely unintentional.    Laban Emperor, PA-C 07/28/20 1207    Vladimir Crofts, MD 07/28/20 [phone]

## 2020-07-28 NOTE — ED Triage Notes (Signed)
Patient reports he was changing his rabbits water tank in cage and the spring popped off and hit him in his left eye. Patient reports he is unable to see out of his left eye.

## 2020-07-29 ENCOUNTER — Ambulatory Visit (INDEPENDENT_AMBULATORY_CARE_PROVIDER_SITE_OTHER): Payer: Medicare HMO

## 2020-07-29 DIAGNOSIS — G4733 Obstructive sleep apnea (adult) (pediatric): Secondary | ICD-10-CM

## 2020-07-29 NOTE — Progress Notes (Signed)
New Cpap Setup    He was setup on new cpap, humidifier, tubing,and resmed F-30 med. He had good understanding of using, cleaning, and compliance

## 2020-08-12 ENCOUNTER — Other Ambulatory Visit: Payer: Self-pay | Admitting: Hospice and Palliative Medicine

## 2020-08-12 ENCOUNTER — Telehealth: Payer: Self-pay

## 2020-08-12 DIAGNOSIS — G4734 Idiopathic sleep related nonobstructive alveolar hypoventilation: Secondary | ICD-10-CM

## 2020-08-12 NOTE — Progress Notes (Signed)
Called and spoke to patient about results of overnight pulse oximetry. Significant hypoxia found during the test. Oxygen has been ordered to be bled through CPAP machine. Will have appointment moved up to see Jason Hudson to discuss issues with CPAP. Encouraged nightly compliance with CPAP.

## 2020-08-12 NOTE — Progress Notes (Signed)
Order placed for overnight oxygen due to overnight pulse oximetry test results. Will also schedule to see Claiborne Billings to discuss issues he is having with CPAP.

## 2020-08-12 NOTE — Telephone Encounter (Signed)
Spoke with sarah from [facility] that we put order for O2 at night for 2 litre with cpap/nasal canula

## 2020-08-20 ENCOUNTER — Other Ambulatory Visit: Payer: Self-pay | Admitting: Hospice and Palliative Medicine

## 2020-08-20 DIAGNOSIS — I5032 Chronic diastolic (congestive) heart failure: Secondary | ICD-10-CM

## 2020-08-20 DIAGNOSIS — N184 Chronic kidney disease, stage 4 (severe): Secondary | ICD-10-CM

## 2020-08-22 DIAGNOSIS — J189 Pneumonia, unspecified organism: Secondary | ICD-10-CM | POA: Diagnosis not present

## 2020-08-22 DIAGNOSIS — I5033 Acute on chronic diastolic (congestive) heart failure: Secondary | ICD-10-CM | POA: Diagnosis not present

## 2020-08-22 DIAGNOSIS — R609 Edema, unspecified: Secondary | ICD-10-CM | POA: Diagnosis not present

## 2020-08-22 DIAGNOSIS — E1165 Type 2 diabetes mellitus with hyperglycemia: Secondary | ICD-10-CM | POA: Diagnosis not present

## 2020-08-22 DIAGNOSIS — E1122 Type 2 diabetes mellitus with diabetic chronic kidney disease: Secondary | ICD-10-CM | POA: Diagnosis not present

## 2020-08-22 DIAGNOSIS — I130 Hypertensive heart and chronic kidney disease with heart failure and stage 1 through stage 4 chronic kidney disease, or unspecified chronic kidney disease: Secondary | ICD-10-CM | POA: Diagnosis not present

## 2020-08-22 DIAGNOSIS — E7800 Pure hypercholesterolemia, unspecified: Secondary | ICD-10-CM | POA: Diagnosis not present

## 2020-08-22 DIAGNOSIS — M10041 Idiopathic gout, right hand: Secondary | ICD-10-CM | POA: Diagnosis not present

## 2020-08-22 DIAGNOSIS — E871 Hypo-osmolality and hyponatremia: Secondary | ICD-10-CM | POA: Diagnosis not present

## 2020-08-22 DIAGNOSIS — M25511 Pain in right shoulder: Secondary | ICD-10-CM | POA: Diagnosis not present

## 2020-08-22 DIAGNOSIS — N184 Chronic kidney disease, stage 4 (severe): Secondary | ICD-10-CM | POA: Diagnosis not present

## 2020-08-22 DIAGNOSIS — G4733 Obstructive sleep apnea (adult) (pediatric): Secondary | ICD-10-CM | POA: Diagnosis not present

## 2020-08-22 DIAGNOSIS — Z23 Encounter for immunization: Secondary | ICD-10-CM | POA: Diagnosis not present

## 2020-08-22 DIAGNOSIS — Z20822 Contact with and (suspected) exposure to covid-19: Secondary | ICD-10-CM | POA: Diagnosis not present

## 2020-08-22 DIAGNOSIS — N179 Acute kidney failure, unspecified: Secondary | ICD-10-CM | POA: Diagnosis not present

## 2020-08-22 DIAGNOSIS — Z6841 Body Mass Index (BMI) 40.0 and over, adult: Secondary | ICD-10-CM | POA: Diagnosis not present

## 2020-08-22 DIAGNOSIS — E873 Alkalosis: Secondary | ICD-10-CM | POA: Diagnosis not present

## 2020-08-22 DIAGNOSIS — R079 Chest pain, unspecified: Secondary | ICD-10-CM | POA: Diagnosis not present

## 2020-08-22 DIAGNOSIS — M79641 Pain in right hand: Secondary | ICD-10-CM | POA: Diagnosis not present

## 2020-08-22 DIAGNOSIS — I340 Nonrheumatic mitral (valve) insufficiency: Secondary | ICD-10-CM | POA: Diagnosis not present

## 2020-08-22 DIAGNOSIS — R918 Other nonspecific abnormal finding of lung field: Secondary | ICD-10-CM | POA: Diagnosis not present

## 2020-08-22 DIAGNOSIS — I2510 Atherosclerotic heart disease of native coronary artery without angina pectoris: Secondary | ICD-10-CM | POA: Diagnosis not present

## 2020-08-22 DIAGNOSIS — I509 Heart failure, unspecified: Secondary | ICD-10-CM | POA: Diagnosis not present

## 2020-08-22 DIAGNOSIS — E039 Hypothyroidism, unspecified: Secondary | ICD-10-CM | POA: Diagnosis not present

## 2020-08-22 DIAGNOSIS — J440 Chronic obstructive pulmonary disease with acute lower respiratory infection: Secondary | ICD-10-CM | POA: Diagnosis not present

## 2020-08-22 DIAGNOSIS — M1811 Unilateral primary osteoarthritis of first carpometacarpal joint, right hand: Secondary | ICD-10-CM | POA: Diagnosis not present

## 2020-08-22 DIAGNOSIS — M25521 Pain in right elbow: Secondary | ICD-10-CM | POA: Diagnosis not present

## 2020-08-24 ENCOUNTER — Telehealth: Payer: Self-pay

## 2020-08-26 ENCOUNTER — Other Ambulatory Visit: Payer: Self-pay

## 2020-08-26 ENCOUNTER — Other Ambulatory Visit: Payer: Self-pay | Admitting: Hospice and Palliative Medicine

## 2020-08-26 ENCOUNTER — Ambulatory Visit: Payer: Medicare HMO

## 2020-08-26 MED ORDER — DILTIAZEM HCL ER BEADS 360 MG PO CP24: 360.0000 mg | ORAL_CAPSULE | Freq: Every day | ORAL | 3 refills | Status: DC

## 2020-08-27 NOTE — Telephone Encounter (Signed)
Pt has appt 09/18/20

## 2020-08-29 DIAGNOSIS — G4733 Obstructive sleep apnea (adult) (pediatric): Secondary | ICD-10-CM | POA: Diagnosis not present

## 2020-08-31 ENCOUNTER — Encounter: Payer: HMO | Admitting: Hospice and Palliative Medicine

## 2020-08-31 ENCOUNTER — Encounter: Payer: Medicare HMO | Admitting: Hospice and Palliative Medicine

## 2020-09-02 ENCOUNTER — Ambulatory Visit: Payer: Medicare HMO

## 2020-09-04 ENCOUNTER — Telehealth: Payer: Self-pay

## 2020-09-04 ENCOUNTER — Other Ambulatory Visit: Payer: Self-pay | Admitting: Hospice and Palliative Medicine

## 2020-09-04 DIAGNOSIS — I5032 Chronic diastolic (congestive) heart failure: Secondary | ICD-10-CM

## 2020-09-04 DIAGNOSIS — N184 Chronic kidney disease, stage 4 (severe): Secondary | ICD-10-CM

## 2020-09-04 NOTE — Telephone Encounter (Signed)
Confirmation of verbal order for home medical equipment signed by provider and placed in [place] folder.

## 2020-09-13 DIAGNOSIS — G4733 Obstructive sleep apnea (adult) (pediatric): Secondary | ICD-10-CM | POA: Diagnosis not present

## 2020-09-14 DIAGNOSIS — E113413 Type 2 diabetes mellitus with severe nonproliferative diabetic retinopathy with macular edema, bilateral: Secondary | ICD-10-CM | POA: Diagnosis not present

## 2020-09-18 ENCOUNTER — Encounter: Payer: Self-pay | Admitting: Hospice and Palliative Medicine

## 2020-09-18 ENCOUNTER — Ambulatory Visit (INDEPENDENT_AMBULATORY_CARE_PROVIDER_SITE_OTHER): Payer: HMO | Admitting: Hospice and Palliative Medicine

## 2020-09-18 VITALS — BP 140/73 | HR 86 | Temp 97.6°F | Resp 16 | Ht 68.0 in | Wt 308.6 lb

## 2020-09-18 DIAGNOSIS — E1122 Type 2 diabetes mellitus with diabetic chronic kidney disease: Secondary | ICD-10-CM

## 2020-09-18 DIAGNOSIS — N184 Chronic kidney disease, stage 4 (severe): Secondary | ICD-10-CM | POA: Diagnosis not present

## 2020-09-18 DIAGNOSIS — M109 Gout, unspecified: Secondary | ICD-10-CM | POA: Diagnosis not present

## 2020-09-18 DIAGNOSIS — K219 Gastro-esophageal reflux disease without esophagitis: Secondary | ICD-10-CM | POA: Diagnosis not present

## 2020-09-18 DIAGNOSIS — E1159 Type 2 diabetes mellitus with other circulatory complications: Secondary | ICD-10-CM

## 2020-09-18 DIAGNOSIS — I5032 Chronic diastolic (congestive) heart failure: Secondary | ICD-10-CM

## 2020-09-18 DIAGNOSIS — Z794 Long term (current) use of insulin: Secondary | ICD-10-CM

## 2020-09-18 DIAGNOSIS — E1165 Type 2 diabetes mellitus with hyperglycemia: Secondary | ICD-10-CM | POA: Diagnosis not present

## 2020-09-18 DIAGNOSIS — E039 Hypothyroidism, unspecified: Secondary | ICD-10-CM

## 2020-09-18 DIAGNOSIS — J449 Chronic obstructive pulmonary disease, unspecified: Secondary | ICD-10-CM

## 2020-09-18 DIAGNOSIS — I152 Hypertension secondary to endocrine disorders: Secondary | ICD-10-CM

## 2020-09-18 DIAGNOSIS — E038 Other specified hypothyroidism: Secondary | ICD-10-CM | POA: Diagnosis not present

## 2020-09-18 LAB — POCT GLYCOSYLATED HEMOGLOBIN (HGB A1C): Hemoglobin A1C: 7.6 % — AB (ref 4.0–5.6)

## 2020-09-18 MED ORDER — FUROSEMIDE 80 MG PO TABS: 80.0000 mg | ORAL_TABLET | Freq: Two times a day (BID) | ORAL | 1 refills | Status: DC

## 2020-09-18 MED ORDER — VITAMIN D3 50 MCG (2000 UT) PO TABS: 2000.0000 [IU] | ORAL_TABLET | Freq: Every day | ORAL | 2 refills | Status: DC

## 2020-09-18 MED ORDER — METOLAZONE 5 MG PO TABS: ORAL_TABLET | ORAL | 0 refills | Status: DC

## 2020-09-18 MED ORDER — ALBUTEROL SULFATE HFA 108 (90 BASE) MCG/ACT IN AERS
1.0000 | INHALATION_SPRAY | Freq: Four times a day (QID) | RESPIRATORY_TRACT | 5 refills | Status: DC | PRN
Start: 2020-09-18 — End: 2021-03-22

## 2020-09-18 MED ORDER — DOXAZOSIN MESYLATE 4 MG PO TABS: 4.0000 mg | ORAL_TABLET | Freq: Every day | ORAL | 3 refills | Status: DC

## 2020-09-18 MED ORDER — OXYCODONE HCL 5 MG PO TABS: 5.0000 mg | ORAL_TABLET | Freq: Four times a day (QID) | ORAL | 0 refills | Status: DC | PRN

## 2020-09-18 MED ORDER — PREDNISONE 10 MG PO TABS
ORAL_TABLET | ORAL | 0 refills | Status: DC
Start: 2020-09-18 — End: 2021-02-16

## 2020-09-18 MED ORDER — INDOMETHACIN 25 MG PO CAPS: 25.0000 mg | ORAL_CAPSULE | Freq: Three times a day (TID) | ORAL | 0 refills | Status: DC

## 2020-09-18 MED ORDER — ALLOPURINOL 100 MG PO TABS: 100.0000 mg | ORAL_TABLET | Freq: Every day | ORAL | 2 refills | Status: DC

## 2020-09-18 MED ORDER — PANTOPRAZOLE SODIUM 40 MG PO TBEC: 40.0000 mg | DELAYED_RELEASE_TABLET | Freq: Every day | ORAL | 2 refills | Status: DC

## 2020-09-18 NOTE — Progress Notes (Signed)
[facility] [place], [place] [address]  Internal MEDICINE  Office Visit Note  Patient Name: Jason [place]  [id number]  [id number]  Date of Service: 09/20/2020 Chief Complaint  Patient presents with  . Follow-up    Gout    . Diabetes  . Gastroesophageal Reflux  . Hypertension  . Hand Pain    From gout   . Cough   HPI Pt is here for recent hospital follow up. He was vacationing in [place] visiting family and was hospitalized in [place] from January 1st-4th Records are unavailable He was able to bring an updated medication list--changes made in the hospital were -added atorvastatin, he was unaware and will discontinue this as he is not willing to take a statin -started allopurinol for gout suppression -stopped furosemide and started on Bumex--since being home his swelling has worsened and he has been taking Bumex, Lasix and Metolazone  Pain in right wrist as well as bilateral lower extremity edema is what prompted him to go to the hospital Elevated uric acid levels--diagnosed with gout Hx of CHF--prolonged car ride contributing to increased swelling Followed by cardiology  History of CKD--followed by nephrology  Today he is c/o increased pain to his right wrist--allopurinol started at discharge has not been working well to control his pain He has been taking 400-800 mg ibuprofen several times a day to help with pain He has also noticed his wrist and joints in his fingers have started to swell Pain is now starting to affect his left wrist Tried acetaminophen which provided him no relief Ankle swelling has improved since taking all three medications--discussed risks associated with this  DM-glucose levels have been stable, followed by endocrinology in the past but has not had a follow-up in several months  He is in need of several refills today  Current Medication: Outpatient Encounter Medications as of 09/18/2020  Medication Sig Note  .  carvedilol (COREG) 6.25 MG tablet Take 6.25 mg by mouth 2 (two) times daily with a meal.   . oxyCODONE (ROXICODONE) 5 MG immediate release tablet Take 1 tablet (5 mg total) by mouth every 6 (six) hours as needed for severe pain.   . predniSONE (DELTASONE) 10 MG tablet Take 1 tablet three times a day with a meal for three for three days, take 1 tablet by twice daily with a meal for 3 days, take 1 tablet once daily with a meal for 3 days   . [DISCONTINUED] allopurinol (ZYLOPRIM) 100 MG tablet Take 100 mg by mouth daily.   . [DISCONTINUED] atorvastatin (LIPITOR) 40 MG tablet Take 40 mg by mouth daily.   . [DISCONTINUED] bumetanide (BUMEX) 1 MG tablet Take 1 mg by mouth daily.   . [DISCONTINUED] indomethacin (INDOCIN) 25 MG capsule Take 1 capsule (25 mg total) by mouth 3 (three) times daily with meals.   . [DISCONTINUED] pantoprazole (PROTONIX) 40 MG tablet Take 40 mg by mouth daily.   Marland Kitchen albuterol (VENTOLIN HFA) 108 (90 Base) MCG/ACT inhaler Inhale 1-2 puffs into the lungs every 6 (six) hours as needed for wheezing or shortness of breath.   . allopurinol (ZYLOPRIM) 100 MG tablet Take 1 tablet (100 mg total) by mouth daily.   Marland Kitchen amiodarone (PACERONE) 200 MG tablet Take 1 tablet (200 mg total) by mouth daily for 30 days.   Marland Kitchen aspirin EC 81 MG tablet Take 81 mg by mouth daily. Swallow whole.   . Cholecalciferol (VITAMIN D3) 50 MCG (2000 UT) TABS Take 2,000 Units by  mouth at bedtime.   Marland Kitchen diltiazem (TIAZAC) 360 MG 24 hr capsule Take 1 capsule (360 mg total) by mouth daily.   Marland Kitchen doxazosin (CARDURA) 4 MG tablet Take 1 tablet (4 mg total) by mouth at bedtime.   . Dulaglutide (TRULICITY) 1.5 HQ/7.5FF SOPN Inject 1.5 mg into the skin every Wednesday.   . fluticasone (FLONASE) 50 MCG/ACT nasal spray Place 2 sprays into both nostrils 2 (two) times daily.    . furosemide (LASIX) 80 MG tablet Take 1 tablet (80 mg total) by mouth 2 (two) times daily.   Marland Kitchen glipiZIDE (GLUCOTROL XL) 10 MG 24 hr tablet Take 1 tablet (10 mg  total) by mouth daily.   . Insulin Degludec (TRESIBA FLEXTOUCH) 200 UNIT/ML SOPN    . ipratropium (ATROVENT) 0.02 % nebulizer solution Take 0.5 mg by nebulization every 6 (six) hours as needed for wheezing or shortness of breath.   Marland Kitchen ipratropium (ATROVENT) 0.02 % nebulizer solution Take 0.5 mg by nebulization 4 (four) times daily.   Marland Kitchen levothyroxine (SYNTHROID) 50 MCG tablet Take 1 tablet (50 mcg total) by mouth daily before breakfast.   . loratadine (CLARITIN) 10 MG tablet Take 10 mg by mouth daily.  08/24/2018: Takes liqui-gels at home  . multivitamin (ONE-A-DAY MEN'S) TABS tablet Take 1 tablet by mouth daily.   . nitroGLYCERIN (NITROSTAT) 0.4 MG SL tablet Place 1 tablet (0.4 mg total) under the tongue every 5 (five) minutes as needed for chest pain.   . pantoprazole (PROTONIX) 40 MG tablet Take 1 tablet (40 mg total) by mouth daily.   . potassium chloride (MICRO-K) 10 MEQ CR capsule Take 1 capsule (10 mEq total) by mouth daily. Take one to to tabs every day   . SYMBICORT 160-4.5 MCG/ACT inhaler TAKE 2 PUFFS BY MOUTH TWICE A DAY   . [DISCONTINUED] albuterol (VENTOLIN HFA) 108 (90 Base) MCG/ACT inhaler Inhale 1-2 puffs into the lungs every 6 (six) hours as needed for wheezing or shortness of breath.   . [DISCONTINUED] amoxicillin-clavulanate (AUGMENTIN) 875-125 MG tablet Take 1 tablet by mouth 2 (two) times daily.   . [DISCONTINUED] bifidobacterium infantis (ALIGN) capsule Take 1 capsule by mouth daily.    . [DISCONTINUED] Cholecalciferol (VITAMIN D3) 2000 units TABS Take 2,000 Units by mouth at bedtime.   . [DISCONTINUED] cyclobenzaprine (FLEXERIL) 10 MG tablet Take one tab po bid prn for back   . [DISCONTINUED] doxazosin (CARDURA) 4 MG tablet Take 1 tablet (4 mg total) by mouth at bedtime.   . [DISCONTINUED] furosemide (LASIX) 80 MG tablet TAKE 1 TABLET BY MOUTH TWICE A DAY   . [DISCONTINUED] lidocaine (LIDODERM) 5 % Place 1 patch onto the skin daily. Remove & Discard patch within 12 hours or as  directed by MD   . [DISCONTINUED] metolazone (ZAROXOLYN) 5 MG tablet Take one tab po qd for swelling   . [DISCONTINUED] metolazone (ZAROXOLYN) 5 MG tablet Take one tab po qd for swelling   . [DISCONTINUED] traMADol (ULTRAM) 50 MG tablet Take by mouth every 6 (six) hours as needed.    No facility-administered encounter medications on file as of 09/18/2020.    Surgical History: Past Surgical History:  Procedure Laterality Date  . COLONOSCOPY WITH PROPOFOL N/A 11/07/2017   Procedure: COLONOSCOPY WITH PROPOFOL;  Surgeon: [name], MD;  Location: [facility] ENDOSCOPY;  Service: Gastroenterology;  Laterality: N/A;  . ESOPHAGOGASTRODUODENOSCOPY (EGD) WITH PROPOFOL N/A 11/07/2017   Procedure: ESOPHAGOGASTRODUODENOSCOPY (EGD) WITH PROPOFOL;  Surgeon: [name], MD;  Location: [facility] ENDOSCOPY;  Service: Gastroenterology;  Laterality: N/A;  . LEFT HEART CATH AND CORONARY ANGIOGRAPHY N/A 10/23/2017   Procedure: LEFT HEART CATH AND CORONARY ANGIOGRAPHY;  Surgeon: Isaias Cowman, MD;  Location: [facility] CV LAB;  Service: Cardiovascular;  Laterality: N/A;    Medical History: Past Medical History:  Diagnosis Date  . CHF (congestive heart failure) ([place])   . COPD (chronic obstructive pulmonary disease) ([place])   . Diabetes mellitus without complication ([place])   . Edema    leg swelling  . Gastroesophageal reflux disease 12/15/2017  . Hypertension   . Melena 11/05/2017  . Pneumonia 07/27/2016    Family History: Family History  Problem Relation Age of Onset  . Cancer Mother   . CVA Father   . Heart disease Father     Social History   Socioeconomic History  . Marital status: Single    Spouse name: Not on file  . Number of children: Not on file  . Years of education: Not on file  . Highest education level: Not on file  Occupational History  . Not on file  Tobacco Use  . Smoking status: Never Smoker  . Smokeless tobacco: Never Used  Substance and Sexual Activity  . Alcohol use: Yes     Comment: ocassionally  . Drug use: Never  . Sexual activity: Not on file  Other Topics Concern  . Not on file  Social History Narrative  . Not on file   Social Determinants of Health   Financial Resource Strain: Not on file  Food Insecurity: Not on file  Transportation Needs: Not on file  Physical Activity: Not on file  Stress: Not on file  Social Connections: Not on file  Intimate Partner Violence: Not on file      Review of Systems  Constitutional: Negative for chills, fatigue and unexpected weight change.  HENT: Negative for congestion, postnasal drip, rhinorrhea, sneezing and sore throat.   Eyes: Negative for redness.  Respiratory: Negative for cough, chest tightness and shortness of breath.   Cardiovascular: Negative for chest pain, palpitations and leg swelling.  Gastrointestinal: Negative for abdominal pain, constipation, diarrhea, nausea and vomiting.  Genitourinary: Negative for dysuria and frequency.  Musculoskeletal: Negative for arthralgias, back pain, joint swelling and neck pain.       Bilateral wrist pain and swelling  Skin: Negative for rash.  Neurological: Negative for tremors, numbness and headaches.  Hematological: Negative for adenopathy. Does not bruise/bleed easily.  Psychiatric/Behavioral: Negative for behavioral problems (Depression), sleep disturbance and suicidal ideas. The patient is not nervous/anxious.     Vital Signs: BP 140/73   Pulse 86   Temp 97.6 F (36.4 C)   Resp 16   Ht 5\' 8"  (1.727 m)   Wt (!) 308 lb 9.6 oz (140 kg)   SpO2 94%   BMI 46.92 kg/m    Physical Exam Vitals reviewed.  Constitutional:      Appearance: Normal appearance. He is obese.  Cardiovascular:     Rate and Rhythm: Normal rate and regular rhythm.     Pulses: Normal pulses.     Heart sounds: Normal heart sounds.  Pulmonary:     Effort: Pulmonary effort is normal.     Breath sounds: Normal breath sounds.  Abdominal:     General: Abdomen is flat.      Palpations: Abdomen is soft.  Musculoskeletal:     Right wrist: Swelling and tenderness present. Decreased range of motion.     Cervical back: Normal range of motion.  Skin:  General: Skin is warm.  Neurological:     General: No focal deficit present.     Mental Status: He is alert and oriented to person, place, and time. Mental status is at baseline.  Psychiatric:        Mood and Affect: Mood normal.        Behavior: Behavior normal.        Thought Content: Thought content normal.        Judgment: Judgment normal.    Assessment/Plan: 1. Type 2 diabetes mellitus with stage 4 chronic kidney disease, with long-term current use of insulin (HCC) A1C 7.6--monitor glucose levels, increase Tresiba by 1 unit with fasting glucose above 180 Review labs - POCT HgB A1C - Comprehensive Metabolic Panel (CMET) - Lipid Panel With LDL/HDL Ratio  2. Chronic obstructive pulmonary disease, unspecified COPD type ([place]) Breathing remains stable, requesting refills of albuterol inhaler--used as needed for shortness of breath Will need updated PFT - albuterol (VENTOLIN HFA) 108 (90 Base) MCG/ACT inhaler; Inhale 1-2 puffs into the lungs every 6 (six) hours as needed for wheezing or shortness of breath.  Dispense: 1 each; Refill: 5  3. Hypertension associated with diabetes ([place]) BP and HR well controlled--requesting refills Review labs - carvedilol (COREG) 6.25 MG tablet; Take 6.25 mg by mouth 2 (two) times daily with a meal. - doxazosin (CARDURA) 4 MG tablet; Take 1 tablet (4 mg total) by mouth at bedtime.  Dispense: 30 tablet; Refill: 3 - CBC w/Diff/Platelet - Lipid Panel With LDL/HDL Ratio  4. Chronic diastolic congestive heart failure ([place]) Stop Bumex--continue with furosemide and metolazone - furosemide (LASIX) 80 MG tablet; Take 1 tablet (80 mg total) by mouth 2 (two) times daily.  Dispense: 180 tablet; Refill: 1  5. Gouty arthritis Review uric acid level Continue allopurinol--requesting  refills May use oxycodone as needed for pain--advised to stop taking ibuprofen, may use acetaminophen with oxycodone Prednisone for swelling--likely gouty arthritis - allopurinol (ZYLOPRIM) 100 MG tablet; Take 1 tablet (100 mg total) by mouth daily.  Dispense: 30 tablet; Refill: 2 - oxyCODONE (ROXICODONE) 5 MG immediate release tablet; Take 1 tablet (5 mg total) by mouth every 6 (six) hours as needed for severe pain.  Dispense: 45 tablet; Refill: 0 - Uric acid - predniSONE (DELTASONE) 10 MG tablet; Take 1 tablet three times a day with a meal for three for three days, take 1 tablet by twice daily with a meal for 3 days, take 1 tablet once daily with a meal for 3 days  Dispense: 18 tablet; Refill: 0  6. Acquired hypothyroidism Will need to repeat levels and adjust therapy as indicated  7. Gastroesophageal reflux disease without esophagitis Symptoms improved since starting pantoprazole at discharge, requesting refills - pantoprazole (PROTONIX) 40 MG tablet; Take 1 tablet (40 mg total) by mouth daily.  Dispense: 30 tablet; Refill: 2  General Counseling: Malva Cogan understanding of the findings of todays visit and agrees with plan of treatment. I have discussed any further diagnostic evaluation that may be needed or ordered today. We also reviewed his medications today. he has been encouraged to call the office with any questions or concerns that should arise related to todays visit.   Orders Placed This Encounter  Procedures  . CBC w/Diff/Platelet  . Comprehensive Metabolic Panel (CMET)  . Lipid Panel With LDL/HDL Ratio  . Uric acid  . POCT HgB A1C   I have reviewed all medical records from hospital follow up including radiology reports and consults from other physicians. Appropriate  follow up diagnostics will be scheduled as needed. Patient/ Family understands the plan of treatment.   Time spent 45 minutes.  This patient was seen by Theodoro Grist AGNP-C in Collaboration with Dr  Lavera Guise as a part of collaborative care agreement  Tanna Furry. [facility] Internal Medicine

## 2020-09-19 ENCOUNTER — Other Ambulatory Visit: Payer: Self-pay | Admitting: Hospice and Palliative Medicine

## 2020-09-19 DIAGNOSIS — N184 Chronic kidney disease, stage 4 (severe): Secondary | ICD-10-CM

## 2020-09-19 DIAGNOSIS — I5032 Chronic diastolic (congestive) heart failure: Secondary | ICD-10-CM

## 2020-09-19 LAB — COMPREHENSIVE METABOLIC PANEL
ALT: 33 IU/L (ref 0–44)
AST: 33 IU/L (ref 0–40)
Albumin/Globulin Ratio: 1.4 (ref 1.2–2.2)
Albumin: 3.8 g/dL (ref 3.8–4.8)
Alkaline Phosphatase: 108 IU/L (ref 44–121)
BUN/Creatinine Ratio: 18 (ref 10–24)
BUN: 37 mg/dL — ABNORMAL HIGH (ref 8–27)
Bilirubin Total: <0.2 mg/dL (ref 0.0–1.2)
CO2: 24 mmol/L (ref 20–29)
Calcium: 9.2 mg/dL (ref 8.6–10.2)
Chloride: 97 mmol/L (ref 96–106)
Creatinine, Ser: 2.03 mg/dL — ABNORMAL HIGH (ref 0.76–1.27)
GFR calc Af Amer: 38 mL/min/{1.73_m2} — ABNORMAL LOW (ref >59)
GFR calc non Af Amer: 33 mL/min/{1.73_m2} — ABNORMAL LOW (ref >59)
Globulin, Total: 2.8 g/dL (ref 1.5–4.5)
Glucose: 166 mg/dL — ABNORMAL HIGH (ref 65–99)
Potassium: 4.3 mmol/L (ref 3.5–5.2)
Sodium: 137 mmol/L (ref 134–144)
Total Protein: 6.6 g/dL (ref 6.0–8.5)

## 2020-09-19 LAB — CBC WITH DIFFERENTIAL/PLATELET
Basophils Absolute: 0 10*3/uL (ref 0.0–0.2)
Basos: 0 %
EOS (ABSOLUTE): 0 10*3/uL (ref 0.0–0.4)
Eos: 0 %
Hematocrit: 31.8 % — ABNORMAL LOW (ref 37.5–51.0)
Hemoglobin: 10.4 g/dL — ABNORMAL LOW (ref 13.0–17.7)
Immature Grans (Abs): 0.1 10*3/uL (ref 0.0–0.1)
Immature Granulocytes: 1 %
Lymphocytes Absolute: 0.7 10*3/uL (ref 0.7–3.1)
Lymphs: 7 %
MCH: 28.3 pg (ref 26.6–33.0)
MCHC: 32.7 g/dL (ref 31.5–35.7)
MCV: 87 fL (ref 79–97)
Monocytes Absolute: 0.8 10*3/uL (ref 0.1–0.9)
Monocytes: 8 %
Neutrophils Absolute: 8.2 10*3/uL — ABNORMAL HIGH (ref 1.4–7.0)
Neutrophils: 84 %
Platelets: 289 10*3/uL (ref 150–450)
RBC: 3.67 x10E6/uL — ABNORMAL LOW (ref 4.14–5.80)
RDW: 15.6 % — ABNORMAL HIGH (ref 11.6–15.4)
WBC: 9.8 10*3/uL (ref 3.4–10.8)

## 2020-09-19 LAB — LIPID PANEL WITH LDL/HDL RATIO
Cholesterol, Total: 145 mg/dL (ref 100–199)
HDL: 45 mg/dL (ref >39)
LDL Chol Calc (NIH): 84 mg/dL (ref 0–99)
LDL/HDL Ratio: 1.9 ratio (ref 0.0–3.6)
Triglycerides: 81 mg/dL (ref 0–149)
VLDL Cholesterol Cal: 16 mg/dL (ref 5–40)

## 2020-09-19 LAB — URIC ACID: Uric Acid: 6 mg/dL (ref 3.8–8.4)

## 2020-09-20 ENCOUNTER — Encounter: Payer: Self-pay | Admitting: Hospice and Palliative Medicine

## 2020-09-23 ENCOUNTER — Telehealth: Payer: Self-pay

## 2020-09-23 NOTE — Telephone Encounter (Signed)
Called [facility] and add on tsh and free t4

## 2020-09-24 ENCOUNTER — Encounter: Payer: Medicare HMO | Admitting: Physician Assistant

## 2020-09-25 ENCOUNTER — Other Ambulatory Visit: Payer: Self-pay | Admitting: Hospice and Palliative Medicine

## 2020-09-25 LAB — T4, FREE: Free T4: 0.92 ng/dL (ref 0.82–1.77)

## 2020-09-25 LAB — TSH: TSH: 8.46 u[IU]/mL — ABNORMAL HIGH (ref 0.450–4.500)

## 2020-09-25 LAB — SPECIMEN STATUS REPORT

## 2020-09-25 MED ORDER — LEVOTHYROXINE SODIUM 88 MCG PO TABS: 88.0000 ug | ORAL_TABLET | Freq: Every day | ORAL | 3 refills | Status: DC

## 2020-09-25 NOTE — Progress Notes (Signed)
Please call and inform based on results, I would like to increase his levothyroxine dose to 88 mcg. Will send for labs after his next follow-up visit.

## 2020-10-13 DIAGNOSIS — H2102 Hyphema, left eye: Secondary | ICD-10-CM | POA: Diagnosis not present

## 2020-10-14 DIAGNOSIS — G4733 Obstructive sleep apnea (adult) (pediatric): Secondary | ICD-10-CM | POA: Diagnosis not present

## 2020-10-20 DIAGNOSIS — E113411 Type 2 diabetes mellitus with severe nonproliferative diabetic retinopathy with macular edema, right eye: Secondary | ICD-10-CM | POA: Diagnosis not present

## 2020-10-20 DIAGNOSIS — E113492 Type 2 diabetes mellitus with severe nonproliferative diabetic retinopathy without macular edema, left eye: Secondary | ICD-10-CM | POA: Diagnosis not present

## 2020-10-21 ENCOUNTER — Ambulatory Visit (INDEPENDENT_AMBULATORY_CARE_PROVIDER_SITE_OTHER): Payer: HMO | Admitting: Hospice and Palliative Medicine

## 2020-10-21 ENCOUNTER — Encounter: Payer: Self-pay | Admitting: Hospice and Palliative Medicine

## 2020-10-21 ENCOUNTER — Other Ambulatory Visit: Payer: Self-pay

## 2020-10-21 VITALS — BP 140/68 | HR 80 | Temp 97.3°F | Resp 16 | Ht 69.0 in | Wt 300.4 lb

## 2020-10-21 DIAGNOSIS — N184 Chronic kidney disease, stage 4 (severe): Secondary | ICD-10-CM

## 2020-10-21 DIAGNOSIS — M10331 Gout due to renal impairment, right wrist: Secondary | ICD-10-CM | POA: Diagnosis not present

## 2020-10-21 DIAGNOSIS — D649 Anemia, unspecified: Secondary | ICD-10-CM

## 2020-10-21 DIAGNOSIS — E039 Hypothyroidism, unspecified: Secondary | ICD-10-CM | POA: Diagnosis not present

## 2020-10-21 MED ORDER — OXYCODONE HCL 5 MG PO TABS: 5.0000 mg | ORAL_TABLET | ORAL | 0 refills | Status: DC | PRN

## 2020-10-21 MED ORDER — COLCHICINE 0.6 MG PO TABS: 0.6000 mg | ORAL_TABLET | Freq: Two times a day (BID) | ORAL | 0 refills | Status: DC

## 2020-10-21 NOTE — Progress Notes (Signed)
[facility] [place], [place] [address]  Internal MEDICINE  Office Visit Note  Patient Name: Jason Hudson  [id number]  [id number]  Date of Service: 10/22/2020  Chief Complaint  Patient presents with  . Acute Visit    Gout right wrist and side of hand, started hurting a couple days ago and its getting more painful  . Quality Metric Gaps    Foot exam, PNA     HPI Pt is here for a sick visit. C/o right wrist pain related to acute gout flare Was hospitalized for in December for acute gout flare with hyperuricemia History of CKD IV Has been taking allopurinol daily--has not skipped his dose  Since discharge from hospital in December he has not had a follow-up with his nephrologist, has not been able to financially afford seeing a specialist  Pain initially started over the weekend to right wrist and since has progressed to severe pain Painful to touch and has noticed some swelling to his outer wrist  Current Medication:  Outpatient Encounter Medications as of 10/21/2020  Medication Sig Note  . colchicine 0.6 MG tablet Take 1 tablet (0.6 mg total) by mouth 2 (two) times daily.   Marland Kitchen oxyCODONE (ROXICODONE) 5 MG immediate release tablet Take 1 tablet (5 mg total) by mouth every 4 (four) hours as needed for severe pain.   Marland Kitchen albuterol (VENTOLIN HFA) 108 (90 Base) MCG/ACT inhaler Inhale 1-2 puffs into the lungs every 6 (six) hours as needed for wheezing or shortness of breath.   . allopurinol (ZYLOPRIM) 100 MG tablet Take 1 tablet (100 mg total) by mouth daily.   Marland Kitchen amiodarone (PACERONE) 200 MG tablet Take 1 tablet (200 mg total) by mouth daily for 30 days.   Marland Kitchen aspirin EC 81 MG tablet Take 81 mg by mouth daily. Swallow whole.   . carvedilol (COREG) 6.25 MG tablet Take 6.25 mg by mouth 2 (two) times daily with a meal.   . Cholecalciferol (VITAMIN D3) 50 MCG (2000 UT) TABS Take 2,000 Units by mouth at bedtime.   Marland Kitchen diltiazem (TIAZAC) 360 MG 24 hr capsule  Take 1 capsule (360 mg total) by mouth daily.   Marland Kitchen doxazosin (CARDURA) 4 MG tablet Take 1 tablet (4 mg total) by mouth at bedtime.   . Dulaglutide (TRULICITY) 1.5 UV/2.5DG SOPN Inject 1.5 mg into the skin every Wednesday.   . fluticasone (FLONASE) 50 MCG/ACT nasal spray Place 2 sprays into both nostrils 2 (two) times daily.    . furosemide (LASIX) 80 MG tablet Take 1 tablet (80 mg total) by mouth 2 (two) times daily.   Marland Kitchen glipiZIDE (GLUCOTROL XL) 10 MG 24 hr tablet Take 1 tablet (10 mg total) by mouth daily.   . Insulin Degludec (TRESIBA FLEXTOUCH) 200 UNIT/ML SOPN    . ipratropium (ATROVENT) 0.02 % nebulizer solution Take 0.5 mg by nebulization every 6 (six) hours as needed for wheezing or shortness of breath.   Marland Kitchen ipratropium (ATROVENT) 0.02 % nebulizer solution Take 0.5 mg by nebulization 4 (four) times daily.   Marland Kitchen levothyroxine (SYNTHROID) 88 MCG tablet Take 1 tablet (88 mcg total) by mouth daily.   Marland Kitchen loratadine (CLARITIN) 10 MG tablet Take 10 mg by mouth daily.  08/24/2018: Takes liqui-gels at home  . metolazone (ZAROXOLYN) 5 MG tablet TAKE 1 TABLET BY MOUTH DAILY FOR SWELLING   . multivitamin (ONE-A-DAY MEN'S) TABS tablet Take 1 tablet by mouth daily.   . nitroGLYCERIN (NITROSTAT) 0.4 MG SL tablet Place 1  tablet (0.4 mg total) under the tongue every 5 (five) minutes as needed for chest pain.   . pantoprazole (PROTONIX) 40 MG tablet Take 1 tablet (40 mg total) by mouth daily.   . potassium chloride (MICRO-K) 10 MEQ CR capsule Take 1 capsule (10 mEq total) by mouth daily. Take one to to tabs every day   . predniSONE (DELTASONE) 10 MG tablet Take 1 tablet three times a day with a meal for three for three days, take 1 tablet by twice daily with a meal for 3 days, take 1 tablet once daily with a meal for 3 days   . SYMBICORT 160-4.5 MCG/ACT inhaler TAKE 2 PUFFS BY MOUTH TWICE A DAY   . [DISCONTINUED] oxyCODONE (ROXICODONE) 5 MG immediate release tablet Take 1 tablet (5 mg total) by mouth every 6 (six)  hours as needed for severe pain.    No facility-administered encounter medications on file as of 10/21/2020.      Medical History: Past Medical History:  Diagnosis Date  . CHF (congestive heart failure) ([place])   . COPD (chronic obstructive pulmonary disease) ([place])   . Diabetes mellitus without complication ([place])   . Edema    leg swelling  . Gastroesophageal reflux disease 12/15/2017  . Gout   . Hypertension   . Melena 11/05/2017  . Pneumonia 07/27/2016     Vital Signs: BP 140/68   Pulse 80   Temp (!) 97.3 F (36.3 C)   Resp 16   Ht 5\' 9"  (1.753 m)   Wt (!) 300 lb 6.4 oz (136.3 kg)   SpO2 95%   BMI 44.36 kg/m    Review of Systems  Constitutional: Negative for chills, fatigue and unexpected weight change.  HENT: Negative for congestion, postnasal drip, rhinorrhea, sneezing and sore throat.   Eyes: Negative for redness.  Respiratory: Negative for cough, chest tightness and shortness of breath.   Cardiovascular: Negative for chest pain and palpitations.  Gastrointestinal: Negative for abdominal pain, constipation, diarrhea, nausea and vomiting.  Genitourinary: Negative for dysuria and frequency.  Musculoskeletal: Negative for arthralgias, back pain, joint swelling and neck pain.       Right wrist pain and swelling  Skin: Negative for rash.  Neurological: Negative.  Negative for tremors and numbness.  Hematological: Negative for adenopathy. Does not bruise/bleed easily.  Psychiatric/Behavioral: Negative for behavioral problems (Depression), sleep disturbance and suicidal ideas. The patient is not nervous/anxious.     Physical Exam Vitals reviewed.  Constitutional:      Appearance: Normal appearance. He is obese.  Cardiovascular:     Rate and Rhythm: Normal rate and regular rhythm.     Pulses: Normal pulses.     Heart sounds: Normal heart sounds.  Pulmonary:     Effort: Pulmonary effort is normal.     Breath sounds: Normal breath sounds.  Musculoskeletal:     Right  wrist: Swelling and tenderness present. Decreased range of motion.     Cervical back: Normal range of motion.  Skin:    General: Skin is warm.  Neurological:     General: No focal deficit present.     Mental Status: He is alert and oriented to person, place, and time. Mental status is at baseline.  Psychiatric:        Mood and Affect: Mood normal.        Behavior: Behavior normal.        Thought Content: Thought content normal.        Judgment: Judgment normal.  Assessment/Plan: 1. Acute gout due to renal impairment involving right wrist Stop allopurinol temporarily during acute flare Start colchicine twice daily for 3 days and then wean down to daily Oxycodone for severe pain relief Will get updated uric acid level - colchicine 0.6 MG tablet; Take 1 tablet (0.6 mg total) by mouth 2 (two) times daily.  Dispense: 30 tablet; Refill: 0 - oxyCODONE (ROXICODONE) 5 MG immediate release tablet; Take 1 tablet (5 mg total) by mouth every 4 (four) hours as needed for severe pain.  Dispense: 30 tablet; Refill: 0 - Uric acid  2. Anemia, unspecified type Will repeat CBC and review anemia panel for underlying cause--likely due to CKD - CBC w/Diff/Platelet - Vitamin B12 - Folate - Iron and TIBC - Ferritin  3. Acquired hypothyroidism Stared on low dose levothyroxine at last visit, will update levels - TSH + free T4  4. Chronic kidney disease (CKD) stage G4/A1, severely decreased glomerular filtration rate (GFR) between 15-29 mL/min/1.73 square meter and albuminuria creatinine ratio less than 30 mg/g (HCC) Needs to schedule in office follow-up with nephrologist - Comprehensive Metabolic Panel (CMET)  General Counseling: Malva Cogan understanding of the findings of todays visit and agrees with plan of treatment. I have discussed any further diagnostic evaluation that may be needed or ordered today. We also reviewed his medications today. he has been encouraged to call the office with  any questions or concerns that should arise related to todays visit.   Orders Placed This Encounter  Procedures  . Comprehensive Metabolic Panel (CMET)  . Uric acid  . TSH + free T4  . CBC w/Diff/Platelet  . Vitamin B12  . Folate  . Iron and TIBC  . Ferritin    Meds ordered this encounter  Medications  . colchicine 0.6 MG tablet    Sig: Take 1 tablet (0.6 mg total) by mouth 2 (two) times daily.    Dispense:  30 tablet    Refill:  0  . oxyCODONE (ROXICODONE) 5 MG immediate release tablet    Sig: Take 1 tablet (5 mg total) by mouth every 4 (four) hours as needed for severe pain.    Dispense:  30 tablet    Refill:  0    Time spent: 30 Minutes Time spent includes review of chart, medications, test results and follow-up plan with the patient.  This patient was seen by Theodoro Grist AGNP-C in Collaboration with Dr Lavera Guise as a part of collaborative care agreement.  Tanna Furry [facility] Internal Medicine

## 2020-10-22 ENCOUNTER — Encounter: Payer: Self-pay | Admitting: Hospice and Palliative Medicine

## 2020-10-22 LAB — CBC WITH DIFFERENTIAL/PLATELET
Basophils Absolute: 0.1 10*3/uL (ref 0.0–0.2)
Basos: 1 %
EOS (ABSOLUTE): 0.3 10*3/uL (ref 0.0–0.4)
Eos: 2 %
Hematocrit: 35.9 % — ABNORMAL LOW (ref 37.5–51.0)
Hemoglobin: 11.6 g/dL — ABNORMAL LOW (ref 13.0–17.7)
Immature Grans (Abs): 0.4 10*3/uL — ABNORMAL HIGH (ref 0.0–0.1)
Immature Granulocytes: 2 %
Lymphocytes Absolute: 1.7 10*3/uL (ref 0.7–3.1)
Lymphs: 11 %
MCH: 29.4 pg (ref 26.6–33.0)
MCHC: 32.3 g/dL (ref 31.5–35.7)
MCV: 91 fL (ref 79–97)
Monocytes Absolute: 1.5 10*3/uL — ABNORMAL HIGH (ref 0.1–0.9)
Monocytes: 9 %
Neutrophils Absolute: 12.2 10*3/uL — ABNORMAL HIGH (ref 1.4–7.0)
Neutrophils: 75 %
Platelets: 311 10*3/uL (ref 150–450)
RBC: 3.95 x10E6/uL — ABNORMAL LOW (ref 4.14–5.80)
RDW: 16.3 % — ABNORMAL HIGH (ref 11.6–15.4)
WBC: 16.2 10*3/uL — ABNORMAL HIGH (ref 3.4–10.8)

## 2020-10-22 LAB — COMPREHENSIVE METABOLIC PANEL
ALT: 30 IU/L (ref 0–44)
AST: 30 IU/L (ref 0–40)
Albumin/Globulin Ratio: 1.2 (ref 1.2–2.2)
Albumin: 3.8 g/dL (ref 3.8–4.8)
Alkaline Phosphatase: 136 IU/L — ABNORMAL HIGH (ref 44–121)
BUN/Creatinine Ratio: 15 (ref 10–24)
BUN: 35 mg/dL — ABNORMAL HIGH (ref 8–27)
Bilirubin Total: 0.3 mg/dL (ref 0.0–1.2)
CO2: 24 mmol/L (ref 20–29)
Calcium: 9.3 mg/dL (ref 8.6–10.2)
Chloride: 94 mmol/L — ABNORMAL LOW (ref 96–106)
Creatinine, Ser: 2.41 mg/dL — ABNORMAL HIGH (ref 0.76–1.27)
Globulin, Total: 3.2 g/dL (ref 1.5–4.5)
Glucose: 117 mg/dL — ABNORMAL HIGH (ref 65–99)
Potassium: 4.1 mmol/L (ref 3.5–5.2)
Sodium: 137 mmol/L (ref 134–144)
Total Protein: 7 g/dL (ref 6.0–8.5)
eGFR: 29 mL/min/{1.73_m2} — ABNORMAL LOW (ref >59)

## 2020-10-22 LAB — FOLATE: Folate: 13.2 ng/mL (ref >3.0)

## 2020-10-22 LAB — IRON AND TIBC
Iron Saturation: 8 % — CL (ref 15–55)
Iron: 27 ug/dL — ABNORMAL LOW (ref 38–169)
Total Iron Binding Capacity: 327 ug/dL (ref 250–450)
UIBC: 300 ug/dL (ref 111–343)

## 2020-10-22 LAB — TSH+FREE T4
Free T4: 0.8 ng/dL — ABNORMAL LOW (ref 0.82–1.77)
TSH: 16.3 u[IU]/mL — ABNORMAL HIGH (ref 0.450–4.500)

## 2020-10-22 LAB — URIC ACID: Uric Acid: 6.8 mg/dL (ref 3.8–8.4)

## 2020-10-22 LAB — VITAMIN B12: Vitamin B-12: 544 pg/mL (ref 232–1245)

## 2020-10-22 LAB — FERRITIN: Ferritin: 88 ng/mL (ref 30–400)

## 2020-10-22 NOTE — Progress Notes (Signed)
Called and spoke with patient after reviewing labs--strongly advised to contact nephrologist for in office follow-up.

## 2020-10-28 DIAGNOSIS — I10 Essential (primary) hypertension: Secondary | ICD-10-CM | POA: Insufficient documentation

## 2020-10-28 DIAGNOSIS — E1122 Type 2 diabetes mellitus with diabetic chronic kidney disease: Secondary | ICD-10-CM | POA: Diagnosis not present

## 2020-10-28 DIAGNOSIS — N184 Chronic kidney disease, stage 4 (severe): Secondary | ICD-10-CM | POA: Diagnosis not present

## 2020-10-28 DIAGNOSIS — N189 Chronic kidney disease, unspecified: Secondary | ICD-10-CM | POA: Diagnosis not present

## 2020-10-30 ENCOUNTER — Other Ambulatory Visit: Payer: Self-pay

## 2020-10-30 ENCOUNTER — Encounter: Payer: Self-pay | Admitting: Physician Assistant

## 2020-10-30 ENCOUNTER — Ambulatory Visit (INDEPENDENT_AMBULATORY_CARE_PROVIDER_SITE_OTHER): Payer: HMO | Admitting: Physician Assistant

## 2020-10-30 VITALS — BP 152/78 | HR 81 | Temp 96.6°F | Resp 16 | Ht 69.0 in | Wt 295.8 lb

## 2020-10-30 DIAGNOSIS — E039 Hypothyroidism, unspecified: Secondary | ICD-10-CM | POA: Diagnosis not present

## 2020-10-30 DIAGNOSIS — N184 Chronic kidney disease, stage 4 (severe): Secondary | ICD-10-CM

## 2020-10-30 DIAGNOSIS — E1122 Type 2 diabetes mellitus with diabetic chronic kidney disease: Secondary | ICD-10-CM

## 2020-10-30 DIAGNOSIS — Z6841 Body Mass Index (BMI) 40.0 and over, adult: Secondary | ICD-10-CM

## 2020-10-30 DIAGNOSIS — Z794 Long term (current) use of insulin: Secondary | ICD-10-CM | POA: Diagnosis not present

## 2020-10-30 DIAGNOSIS — M10331 Gout due to renal impairment, right wrist: Secondary | ICD-10-CM

## 2020-10-30 DIAGNOSIS — H66001 Acute suppurative otitis media without spontaneous rupture of ear drum, right ear: Secondary | ICD-10-CM | POA: Diagnosis not present

## 2020-10-30 DIAGNOSIS — R197 Diarrhea, unspecified: Secondary | ICD-10-CM

## 2020-10-30 DIAGNOSIS — I152 Hypertension secondary to endocrine disorders: Secondary | ICD-10-CM

## 2020-10-30 DIAGNOSIS — R300 Dysuria: Secondary | ICD-10-CM

## 2020-10-30 DIAGNOSIS — E1159 Type 2 diabetes mellitus with other circulatory complications: Secondary | ICD-10-CM | POA: Diagnosis not present

## 2020-10-30 DIAGNOSIS — I5032 Chronic diastolic (congestive) heart failure: Secondary | ICD-10-CM

## 2020-10-30 DIAGNOSIS — Z0001 Encounter for general adult medical examination with abnormal findings: Secondary | ICD-10-CM | POA: Diagnosis not present

## 2020-10-30 MED ORDER — FEBUXOSTAT 40 MG PO TABS: 40.0000 mg | ORAL_TABLET | Freq: Every day | ORAL | 2 refills | Status: DC

## 2020-10-30 MED ORDER — CIPROFLOXACIN HCL 500 MG PO TABS: 500.0000 mg | ORAL_TABLET | Freq: Two times a day (BID) | ORAL | 0 refills | Status: DC

## 2020-10-30 NOTE — Progress Notes (Signed)
[facility] [place], [place] [address]  Internal MEDICINE  Office Visit Note  Patient Name: Jason Hudson  [id number]  [id number]  Date of Service: 10/30/2020  Chief Complaint  Patient presents with  . Medicare Wellness    Sinus and ear infection, started 10/26/20, right side, ear pain and pain on that side of head/face  . COPD  . Diabetes  . Hypertension     HPI Pt is here for routine health maintenance examination -He went to nephrology  2 days ago and they are following him now. Next visit in 3 months. They put him back on lasix BID due to swelling in LE. -Gout in R wrist still flaring, has brace on and was taking allopurinol -Has had gas and diarrhea last 3 days, it is improving. Drinking pedialyte and lots of water.  -R ear hurts and sinus pressure. Will start Singulair and continue to use flonase -[facility] is giving shots in R eye due to diabetic retinopathy. Eye exam last week. -Endocrinology follow up next Friday. Will address thyroid and diabetes. -Cardiology f/u on Monday with Dr. Arlana Pouch. -BP at home 115-130s/58-80  Current Medication: Outpatient Encounter Medications as of 10/30/2020  Medication Sig Note  . ciprofloxacin (CIPRO) 500 MG tablet Take 1 tablet (500 mg total) by mouth 2 (two) times daily.   . febuxostat (ULORIC) 40 MG tablet Take 1 tablet (40 mg total) by mouth daily.   Marland Kitchen albuterol (VENTOLIN HFA) 108 (90 Base) MCG/ACT inhaler Inhale 1-2 puffs into the lungs every 6 (six) hours as needed for wheezing or shortness of breath.   Marland Kitchen amiodarone (PACERONE) 200 MG tablet Take 1 tablet (200 mg total) by mouth daily for 30 days.   Marland Kitchen aspirin EC 81 MG tablet Take 81 mg by mouth daily. Swallow whole.   . carvedilol (COREG) 6.25 MG tablet Take 6.25 mg by mouth 2 (two) times daily with a meal.   . Cholecalciferol (VITAMIN D3) 50 MCG (2000 UT) TABS Take 2,000 Units by mouth at bedtime.   . colchicine 0.6 MG tablet Take 1 tablet  (0.6 mg total) by mouth 2 (two) times daily.   Marland Kitchen diltiazem (TIAZAC) 360 MG 24 hr capsule Take 1 capsule (360 mg total) by mouth daily.   Marland Kitchen doxazosin (CARDURA) 4 MG tablet Take 1 tablet (4 mg total) by mouth at bedtime.   . Dulaglutide (TRULICITY) 1.5 HG/9.9ME SOPN Inject 1.5 mg into the skin every Wednesday.   . fluticasone (FLONASE) 50 MCG/ACT nasal spray Place 2 sprays into both nostrils 2 (two) times daily.    . furosemide (LASIX) 80 MG tablet Take 1 tablet (80 mg total) by mouth 2 (two) times daily.   Marland Kitchen glipiZIDE (GLUCOTROL XL) 10 MG 24 hr tablet Take 1 tablet (10 mg total) by mouth daily.   . Insulin Degludec (TRESIBA FLEXTOUCH) 200 UNIT/ML SOPN    . ipratropium (ATROVENT) 0.02 % nebulizer solution Take 0.5 mg by nebulization every 6 (six) hours as needed for wheezing or shortness of breath.   Marland Kitchen ipratropium (ATROVENT) 0.02 % nebulizer solution Take 0.5 mg by nebulization 4 (four) times daily.   Marland Kitchen levothyroxine (SYNTHROID) 88 MCG tablet Take 1 tablet (88 mcg total) by mouth daily.   Marland Kitchen loratadine (CLARITIN) 10 MG tablet Take 10 mg by mouth daily.  08/24/2018: Takes liqui-gels at home  . metolazone (ZAROXOLYN) 5 MG tablet TAKE 1 TABLET BY MOUTH DAILY FOR SWELLING   . multivitamin (ONE-A-DAY MEN'S) TABS tablet Take 1  tablet by mouth daily.   . nitroGLYCERIN (NITROSTAT) 0.4 MG SL tablet Place 1 tablet (0.4 mg total) under the tongue every 5 (five) minutes as needed for chest pain.   Marland Kitchen oxyCODONE (ROXICODONE) 5 MG immediate release tablet Take 1 tablet (5 mg total) by mouth every 4 (four) hours as needed for severe pain.   . pantoprazole (PROTONIX) 40 MG tablet Take 1 tablet (40 mg total) by mouth daily.   . potassium chloride (MICRO-K) 10 MEQ CR capsule Take 1 capsule (10 mEq total) by mouth daily. Take one to to tabs every day   . predniSONE (DELTASONE) 10 MG tablet Take 1 tablet three times a day with a meal for three for three days, take 1 tablet by twice daily with a meal for 3 days, take 1 tablet  once daily with a meal for 3 days   . SYMBICORT 160-4.5 MCG/ACT inhaler TAKE 2 PUFFS BY MOUTH TWICE A DAY   . [DISCONTINUED] allopurinol (ZYLOPRIM) 100 MG tablet Take 1 tablet (100 mg total) by mouth daily.    No facility-administered encounter medications on file as of 10/30/2020.    Surgical History: Past Surgical History:  Procedure Laterality Date  . COLONOSCOPY WITH PROPOFOL N/A 11/07/2017   Procedure: COLONOSCOPY WITH PROPOFOL;  Surgeon: [name], MD;  Location: [facility] ENDOSCOPY;  Service: Gastroenterology;  Laterality: N/A;  . ESOPHAGOGASTRODUODENOSCOPY (EGD) WITH PROPOFOL N/A 11/07/2017   Procedure: ESOPHAGOGASTRODUODENOSCOPY (EGD) WITH PROPOFOL;  Surgeon: [name], MD;  Location: [facility] ENDOSCOPY;  Service: Gastroenterology;  Laterality: N/A;  . LEFT HEART CATH AND CORONARY ANGIOGRAPHY N/A 10/23/2017   Procedure: LEFT HEART CATH AND CORONARY ANGIOGRAPHY;  Surgeon: Isaias Cowman, MD;  Location: [place] CV LAB;  Service: Cardiovascular;  Laterality: N/A;    Medical History: Past Medical History:  Diagnosis Date  . CHF (congestive heart failure) ([place])   . COPD (chronic obstructive pulmonary disease) ([place])   . Diabetes mellitus without complication ([place])   . Edema    leg swelling  . Gastroesophageal reflux disease 12/15/2017  . Gout   . Hypertension   . Melena 11/05/2017  . Pneumonia 07/27/2016    Family History: Family History  Problem Relation Age of Onset  . Cancer Mother   . CVA Father   . Heart disease Father       Review of Systems  Constitutional: Negative for chills, fatigue and unexpected weight change.  HENT: Positive for ear pain, postnasal drip and sinus pressure. Negative for congestion, rhinorrhea, sneezing and sore throat.   Eyes: Negative for redness.  Respiratory: Negative for cough, chest tightness and shortness of breath.   Cardiovascular: Positive for leg swelling. Negative for chest pain and palpitations.  Gastrointestinal: Positive  for diarrhea. Negative for abdominal pain, blood in stool, constipation, nausea and vomiting.  Genitourinary: Negative for dysuria and frequency.  Musculoskeletal: Negative for arthralgias, back pain, joint swelling and neck pain.  Skin: Negative for rash.  Neurological: Negative.  Negative for tremors and numbness.  Hematological: Negative for adenopathy. Does not bruise/bleed easily.  Psychiatric/Behavioral: Negative for behavioral problems (Depression), sleep disturbance and suicidal ideas. The patient is not nervous/anxious.      Vital Signs: BP (!) 152/78   Pulse 81   Temp (!) 96.6 F (35.9 C)   Resp 16   Ht 5' 9"  (1.753 m)   Wt 295 lb 12.8 oz (134.2 kg)   SpO2 97%   BMI 43.68 kg/m    Physical Exam Constitutional:      General:  He is not in acute distress.    Appearance: He is well-developed. He is obese. He is not diaphoretic.  HENT:     Head: Normocephalic and atraumatic.     Right Ear: External ear normal.     Left Ear: Tympanic membrane and external ear normal.     Ears:     Comments: Right TM erythematous with possible fluid    Nose: Congestion present.     Mouth/Throat:     Pharynx: No oropharyngeal exudate.  Eyes:     General: No scleral icterus.       Right eye: No discharge.        Left eye: No discharge.     Conjunctiva/sclera: Conjunctivae normal.     Pupils: Pupils are equal, round, and reactive to light.  Neck:     Thyroid: No thyromegaly.     Vascular: No JVD.     Trachea: No tracheal deviation.  Cardiovascular:     Rate and Rhythm: Normal rate and regular rhythm.     Heart sounds: Normal heart sounds. No murmur heard. No friction rub. No gallop.   Pulmonary:     Effort: Pulmonary effort is normal. No respiratory distress.     Breath sounds: Normal breath sounds. No stridor. No wheezing or rales.  Chest:     Chest wall: No tenderness.  Abdominal:     General: Bowel sounds are normal. There is no distension.     Palpations: Abdomen is soft.  There is no mass.     Tenderness: There is no abdominal tenderness. There is no guarding or rebound.  Musculoskeletal:        General: No tenderness or deformity. Normal range of motion.     Cervical back: Normal range of motion and neck supple.     Right lower leg: Edema present.     Left lower leg: Edema present.  Lymphadenopathy:     Cervical: No cervical adenopathy.  Skin:    General: Skin is warm and dry.     Coloration: Skin is not pale.     Findings: No rash.  Neurological:     Mental Status: He is alert.     Cranial Nerves: No cranial nerve deficit.     Motor: No abnormal muscle tone.     Coordination: Coordination normal.     Deep Tendon Reflexes: Reflexes are normal and symmetric.  Psychiatric:        Behavior: Behavior normal.        Thought Content: Thought content normal.        Judgment: Judgment normal.      LABS: Recent Results (from the past 2160 hour(s))  POCT HgB A1C     Status: Abnormal   Collection Time: 09/18/20  8:50 AM  Result Value Ref Range   Hemoglobin A1C 7.6 (A) 4.0 - 5.6 %   HbA1c POC (<> result, manual entry)     HbA1c, POC (prediabetic range)     HbA1c, POC (controlled diabetic range)    CBC w/Diff/Platelet     Status: Abnormal   Collection Time: 09/18/20 10:50 AM  Result Value Ref Range   WBC 9.8 3.4 - 10.8 x10E3/uL   RBC 3.67 (L) 4.14 - 5.80 x10E6/uL   Hemoglobin 10.4 (L) 13.0 - 17.7 g/dL   Hematocrit 31.8 (L) 37.5 - 51.0 %   MCV 87 79 - 97 fL   MCH 28.3 26.6 - 33.0 pg   MCHC 32.7 31.5 - 35.7 g/dL  RDW 15.6 (H) 11.6 - 15.4 %   Platelets 289 150 - 450 x10E3/uL   Neutrophils 84 Not Estab. %   Lymphs 7 Not Estab. %   Monocytes 8 Not Estab. %   Eos 0 Not Estab. %   Basos 0 Not Estab. %   Neutrophils Absolute 8.2 (H) 1.4 - 7.0 x10E3/uL   Lymphocytes Absolute 0.7 0.7 - 3.1 x10E3/uL   Monocytes Absolute 0.8 0.1 - 0.9 x10E3/uL   EOS (ABSOLUTE) 0.0 0.0 - 0.4 x10E3/uL   Basophils Absolute 0.0 0.0 - 0.2 x10E3/uL   Immature Granulocytes  1 Not Estab. %   Immature Grans (Abs) 0.1 0.0 - 0.1 x10E3/uL  Comprehensive Metabolic Panel (CMET)     Status: Abnormal   Collection Time: 09/18/20 10:50 AM  Result Value Ref Range   Glucose 166 (H) 65 - 99 mg/dL   BUN 37 (H) 8 - 27 mg/dL   Creatinine, Ser 2.03 (H) 0.76 - 1.27 mg/dL   GFR calc non Af Amer 33 (L) >59 mL/min/1.73   GFR calc Af Amer 38 (L) >59 mL/min/1.73    Comment: **In accordance with recommendations from the NKF-ASN Task force,**   [facility] is in the process of updating its eGFR calculation to the   2021 CKD-EPI creatinine equation that estimates kidney function   without a race variable.    BUN/Creatinine Ratio 18 10 - 24   Sodium 137 134 - 144 mmol/L   Potassium 4.3 3.5 - 5.2 mmol/L   Chloride 97 96 - 106 mmol/L   CO2 24 20 - 29 mmol/L   Calcium 9.2 8.6 - 10.2 mg/dL   Total Protein 6.6 6.0 - 8.5 g/dL   Albumin 3.8 3.8 - 4.8 g/dL   Globulin, Total 2.8 1.5 - 4.5 g/dL   Albumin/Globulin Ratio 1.4 1.2 - 2.2   Bilirubin Total <0.2 0.0 - 1.2 mg/dL   Alkaline Phosphatase 108 44 - 121 IU/L   AST 33 0 - 40 IU/L   ALT 33 0 - 44 IU/L  Lipid Panel With LDL/HDL Ratio     Status: None   Collection Time: 09/18/20 10:50 AM  Result Value Ref Range   Cholesterol, Total 145 100 - 199 mg/dL   Triglycerides 81 0 - 149 mg/dL   HDL 45 >39 mg/dL   VLDL Cholesterol Cal 16 5 - 40 mg/dL   LDL Chol Calc (NIH) 84 0 - 99 mg/dL   LDL/HDL Ratio 1.9 0.0 - 3.6 ratio    Comment:                                     LDL/HDL Ratio                                             Men  Women                               1/2 Avg.Risk  1.0    1.5                                   Avg.Risk  3.6    3.2  2X Avg.Risk  6.2    5.0                                3X Avg.Risk  8.0    6.1   Uric acid     Status: None   Collection Time: 09/18/20 10:50 AM  Result Value Ref Range   Uric Acid 6.0 3.8 - 8.4 mg/dL    Comment:            Therapeutic target for gout patients: <6.0   T4, free     Status: None   Collection Time: 09/18/20 10:50 AM  Result Value Ref Range   Free T4 0.92 0.82 - 1.77 ng/dL  TSH     Status: Abnormal   Collection Time: 09/18/20 10:50 AM  Result Value Ref Range   TSH 8.460 (H) 0.450 - 4.500 uIU/mL  Specimen status report     Status: None   Collection Time: 09/18/20 10:50 AM  Result Value Ref Range   specimen status report Comment     Comment: Written Authorization Written Authorization Written Authorization Received. Authorization received from nimisha patel 09-24-2020 Logged by Susie Cassette   Comprehensive Metabolic Panel (CMET)     Status: Abnormal   Collection Time: 10/21/20 11:02 AM  Result Value Ref Range   Glucose 117 (H) 65 - 99 mg/dL   BUN 35 (H) 8 - 27 mg/dL   Creatinine, Ser 2.41 (H) 0.76 - 1.27 mg/dL   eGFR 29 (L) >59 mL/min/1.73    Comment: **In accordance with recommendations from the NKF-ASN Task force,**   [facility] has updated its eGFR calculation to the 2021 CKD-EPI   creatinine equation that estimates kidney function without a race   variable.    BUN/Creatinine Ratio 15 10 - 24   Sodium 137 134 - 144 mmol/L   Potassium 4.1 3.5 - 5.2 mmol/L   Chloride 94 (L) 96 - 106 mmol/L   CO2 24 20 - 29 mmol/L   Calcium 9.3 8.6 - 10.2 mg/dL   Total Protein 7.0 6.0 - 8.5 g/dL   Albumin 3.8 3.8 - 4.8 g/dL   Globulin, Total 3.2 1.5 - 4.5 g/dL   Albumin/Globulin Ratio 1.2 1.2 - 2.2   Bilirubin Total 0.3 0.0 - 1.2 mg/dL   Alkaline Phosphatase 136 (H) 44 - 121 IU/L   AST 30 0 - 40 IU/L   ALT 30 0 - 44 IU/L  Uric acid     Status: None   Collection Time: 10/21/20 11:02 AM  Result Value Ref Range   Uric Acid 6.8 3.8 - 8.4 mg/dL    Comment:            Therapeutic target for gout patients: <6.0  TSH + free T4     Status: Abnormal   Collection Time: 10/21/20 11:02 AM  Result Value Ref Range   TSH 16.300 (H) 0.450 - 4.500 uIU/mL   Free T4 0.80 (L) 0.82 - 1.77 ng/dL  CBC w/Diff/Platelet     Status: Abnormal   Collection  Time: 10/21/20 11:02 AM  Result Value Ref Range   WBC 16.2 (H) 3.4 - 10.8 x10E3/uL   RBC 3.95 (L) 4.14 - 5.80 x10E6/uL   Hemoglobin 11.6 (L) 13.0 - 17.7 g/dL   Hematocrit 35.9 (L) 37.5 - 51.0 %   MCV 91 79 - 97 fL   MCH 29.4 26.6 - 33.0 pg   MCHC 32.3 31.5 - 35.7  g/dL   RDW 16.3 (H) 11.6 - 15.4 %   Platelets 311 150 - 450 x10E3/uL   Neutrophils 75 Not Estab. %   Lymphs 11 Not Estab. %   Monocytes 9 Not Estab. %   Eos 2 Not Estab. %   Basos 1 Not Estab. %   Neutrophils Absolute 12.2 (H) 1.4 - 7.0 x10E3/uL   Lymphocytes Absolute 1.7 0.7 - 3.1 x10E3/uL   Monocytes Absolute 1.5 (H) 0.1 - 0.9 x10E3/uL   EOS (ABSOLUTE) 0.3 0.0 - 0.4 x10E3/uL   Basophils Absolute 0.1 0.0 - 0.2 x10E3/uL   Immature Granulocytes 2 Not Estab. %   Immature Grans (Abs) 0.4 (H) 0.0 - 0.1 x10E3/uL    Comment: (An elevated percentage of Immature Granulocytes has not been found to be clinically significant as a sole clinical predictor of disease. Does NOT include bands or blast cells.  Pregnancy associated physiological leukocytosis may also show increased immature granulocytes without clinical significance.)   Vitamin B12     Status: None   Collection Time: 10/21/20 11:02 AM  Result Value Ref Range   Vitamin B-12 544 232 - 1,245 pg/mL  Folate     Status: None   Collection Time: 10/21/20 11:02 AM  Result Value Ref Range   Folate 13.2 >3.0 ng/mL    Comment: A serum folate concentration of less than 3.1 ng/mL is considered to represent clinical deficiency.   Iron and TIBC     Status: Abnormal   Collection Time: 10/21/20 11:02 AM  Result Value Ref Range   Total Iron Binding Capacity 327 250 - 450 ug/dL   UIBC 300 111 - 343 ug/dL   Iron 27 (L) 38 - 169 ug/dL   Iron Saturation 8 (LL) 15 - 55 %  Ferritin     Status: None   Collection Time: 10/21/20 11:02 AM  Result Value Ref Range   Ferritin 88 30 - 400 ng/mL       Assessment/Plan: 1. Encounter for general adult medical examination with abnormal  findings Recently reviewed labs. UTD on colonoscopy.  2. Non-recurrent acute suppurative otitis media of right ear without spontaneous rupture of tympanic membrane Start on Cipro for acute OM. - ciprofloxacin (CIPRO) 500 MG tablet; Take 1 tablet (500 mg total) by mouth 2 (two) times daily.  Dispense: 20 tablet; Refill: 0  3. Acute gout due to renal impairment involving right wrist Switch to Uloric instead of allopurinol for preventive therapy of gout. May continue colchicine for acute flare as needed, but knows to be cautious with use based on renal impact. Advised to go to ED if any worsening. - febuxostat (ULORIC) 40 MG tablet; Take 1 tablet (40 mg total) by mouth daily.  Dispense: 30 tablet; Refill: 2  4. Hypertension associated with diabetes ([place]) BP still elevated, but has been better at home, he is in pain from acute gout flare. He was increased to lasix BID by nephrology due to LE edema. Will continue to monitor. He is seeing cardiology on Monday and will hold off on any other changes for now.  5. Type 2 diabetes mellitus with stage 4 chronic kidney disease, with long-term current use of insulin ([place]) Followed by endocrinology with appt for next Friday to further address diabetes and thyroid.  6. Acquired hypothyroidism On synthroid 54mg daily currently, but labs shows T4 still borderline low. Followed by endocrinology with appt for next Friday to further address diabetes and thyroid.  7. Chronic kidney disease (CKD) stage G4/A1, severely decreased glomerular  filtration rate (GFR) between 15-29 mL/min/1.73 square meter and albuminuria creatinine ratio less than 30 mg/g (HCC) Followed by nephrology.  8. Chronic diastolic congestive heart failure ([place]) Followed by cardiology, upcoming visit on Monday  9. Diarrhea, unspecified type States it is improving now, counseled that it could be due to colchicine, however pt does not think it is that since he has taken it sparingly. Advised to  stay well hydrated and to call or go to ED if he is worsening.  10. Dysuria - UA/M w/rflx Culture, Routine   General Counseling: Malva Cogan understanding of the findings of todays visit and agrees with plan of treatment. I have discussed any further diagnostic evaluation that may be needed or ordered today. We also reviewed his medications today. he has been encouraged to call the office with any questions or concerns that should arise related to todays visit.    Counseling:    Orders Placed This Encounter  Procedures  . UA/M w/rflx Culture, Routine    Meds ordered this encounter  Medications  . febuxostat (ULORIC) 40 MG tablet    Sig: Take 1 tablet (40 mg total) by mouth daily.    Dispense:  30 tablet    Refill:  2  . ciprofloxacin (CIPRO) 500 MG tablet    Sig: Take 1 tablet (500 mg total) by mouth 2 (two) times daily.    Dispense:  20 tablet    Refill:  0    This patient was seen by Drema Dallas, PA-C in collaboration with Dr. Clayborn Bigness as a part of collaborative care agreement.  Total time spent:30 Minutes  Time spent includes review of chart, medications, test results, and follow up plan with the patient.     Lavera Guise, MD  Internal Medicine

## 2020-10-31 LAB — UA/M W/RFLX CULTURE, ROUTINE
Bilirubin, UA: NEGATIVE
Glucose, UA: NEGATIVE
Ketones, UA: NEGATIVE
Leukocytes,UA: NEGATIVE
Nitrite, UA: NEGATIVE
Protein,UA: NEGATIVE
RBC, UA: NEGATIVE
Specific Gravity, UA: 1.008 (ref 1.005–1.030)
Urobilinogen, Ur: 0.2 mg/dL (ref 0.2–1.0)
pH, UA: 8 — ABNORMAL HIGH (ref 5.0–7.5)

## 2020-10-31 LAB — MICROSCOPIC EXAMINATION
Bacteria, UA: NONE SEEN
Casts: NONE SEEN /lpf
WBC, UA: NONE SEEN /hpf (ref 0–5)

## 2020-11-02 DIAGNOSIS — E119 Type 2 diabetes mellitus without complications: Secondary | ICD-10-CM | POA: Diagnosis not present

## 2020-11-02 DIAGNOSIS — R0600 Dyspnea, unspecified: Secondary | ICD-10-CM | POA: Diagnosis not present

## 2020-11-02 DIAGNOSIS — N184 Chronic kidney disease, stage 4 (severe): Secondary | ICD-10-CM | POA: Diagnosis not present

## 2020-11-02 DIAGNOSIS — R600 Localized edema: Secondary | ICD-10-CM | POA: Diagnosis not present

## 2020-11-02 DIAGNOSIS — I2510 Atherosclerotic heart disease of native coronary artery without angina pectoris: Secondary | ICD-10-CM | POA: Diagnosis not present

## 2020-11-02 DIAGNOSIS — G4733 Obstructive sleep apnea (adult) (pediatric): Secondary | ICD-10-CM | POA: Diagnosis not present

## 2020-11-02 DIAGNOSIS — J449 Chronic obstructive pulmonary disease, unspecified: Secondary | ICD-10-CM | POA: Diagnosis not present

## 2020-11-02 DIAGNOSIS — I480 Paroxysmal atrial fibrillation: Secondary | ICD-10-CM | POA: Diagnosis not present

## 2020-11-02 DIAGNOSIS — I10 Essential (primary) hypertension: Secondary | ICD-10-CM | POA: Diagnosis not present

## 2020-11-02 DIAGNOSIS — E7800 Pure hypercholesterolemia, unspecified: Secondary | ICD-10-CM | POA: Diagnosis not present

## 2020-11-02 DIAGNOSIS — I5023 Acute on chronic systolic (congestive) heart failure: Secondary | ICD-10-CM | POA: Diagnosis not present

## 2020-11-11 DIAGNOSIS — G4733 Obstructive sleep apnea (adult) (pediatric): Secondary | ICD-10-CM | POA: Diagnosis not present

## 2020-11-12 ENCOUNTER — Other Ambulatory Visit: Payer: Self-pay | Admitting: Hospice and Palliative Medicine

## 2020-11-12 DIAGNOSIS — K219 Gastro-esophageal reflux disease without esophagitis: Secondary | ICD-10-CM

## 2020-11-24 ENCOUNTER — Other Ambulatory Visit: Payer: Self-pay | Admitting: Hospice and Palliative Medicine

## 2020-11-24 DIAGNOSIS — E1122 Type 2 diabetes mellitus with diabetic chronic kidney disease: Secondary | ICD-10-CM

## 2020-11-24 NOTE — Telephone Encounter (Signed)
Can you please review 

## 2020-12-08 ENCOUNTER — Other Ambulatory Visit: Payer: Self-pay | Admitting: Hospice and Palliative Medicine

## 2020-12-08 DIAGNOSIS — I152 Hypertension secondary to endocrine disorders: Secondary | ICD-10-CM

## 2020-12-08 DIAGNOSIS — E1159 Type 2 diabetes mellitus with other circulatory complications: Secondary | ICD-10-CM

## 2020-12-11 ENCOUNTER — Telehealth: Payer: Self-pay

## 2020-12-11 NOTE — Telephone Encounter (Signed)
Completed medical records for [facility]  Mailed to [address], [address]  [place] [address] 

## 2020-12-12 DIAGNOSIS — G4733 Obstructive sleep apnea (adult) (pediatric): Secondary | ICD-10-CM | POA: Diagnosis not present

## 2020-12-22 DIAGNOSIS — E1122 Type 2 diabetes mellitus with diabetic chronic kidney disease: Secondary | ICD-10-CM | POA: Diagnosis not present

## 2020-12-22 DIAGNOSIS — Z794 Long term (current) use of insulin: Secondary | ICD-10-CM | POA: Diagnosis not present

## 2020-12-22 DIAGNOSIS — E785 Hyperlipidemia, unspecified: Secondary | ICD-10-CM | POA: Diagnosis not present

## 2020-12-22 DIAGNOSIS — E1169 Type 2 diabetes mellitus with other specified complication: Secondary | ICD-10-CM | POA: Diagnosis not present

## 2020-12-22 DIAGNOSIS — E039 Hypothyroidism, unspecified: Secondary | ICD-10-CM | POA: Diagnosis not present

## 2020-12-22 DIAGNOSIS — N184 Chronic kidney disease, stage 4 (severe): Secondary | ICD-10-CM | POA: Diagnosis not present

## 2020-12-22 DIAGNOSIS — I152 Hypertension secondary to endocrine disorders: Secondary | ICD-10-CM | POA: Diagnosis not present

## 2020-12-22 DIAGNOSIS — E1159 Type 2 diabetes mellitus with other circulatory complications: Secondary | ICD-10-CM | POA: Diagnosis not present

## 2020-12-31 ENCOUNTER — Ambulatory Visit (INDEPENDENT_AMBULATORY_CARE_PROVIDER_SITE_OTHER): Payer: HMO | Admitting: Physician Assistant

## 2020-12-31 ENCOUNTER — Other Ambulatory Visit: Payer: Self-pay

## 2020-12-31 ENCOUNTER — Encounter: Payer: Self-pay | Admitting: Physician Assistant

## 2020-12-31 DIAGNOSIS — M10331 Gout due to renal impairment, right wrist: Secondary | ICD-10-CM | POA: Diagnosis not present

## 2020-12-31 DIAGNOSIS — Z0001 Encounter for general adult medical examination with abnormal findings: Secondary | ICD-10-CM

## 2020-12-31 DIAGNOSIS — N184 Chronic kidney disease, stage 4 (severe): Secondary | ICD-10-CM | POA: Diagnosis not present

## 2020-12-31 DIAGNOSIS — H66001 Acute suppurative otitis media without spontaneous rupture of ear drum, right ear: Secondary | ICD-10-CM

## 2020-12-31 DIAGNOSIS — R197 Diarrhea, unspecified: Secondary | ICD-10-CM

## 2020-12-31 DIAGNOSIS — I152 Hypertension secondary to endocrine disorders: Secondary | ICD-10-CM | POA: Diagnosis not present

## 2020-12-31 DIAGNOSIS — E039 Hypothyroidism, unspecified: Secondary | ICD-10-CM | POA: Diagnosis not present

## 2020-12-31 DIAGNOSIS — E1122 Type 2 diabetes mellitus with diabetic chronic kidney disease: Secondary | ICD-10-CM | POA: Diagnosis not present

## 2020-12-31 DIAGNOSIS — E1159 Type 2 diabetes mellitus with other circulatory complications: Secondary | ICD-10-CM | POA: Diagnosis not present

## 2020-12-31 DIAGNOSIS — I5032 Chronic diastolic (congestive) heart failure: Secondary | ICD-10-CM

## 2020-12-31 DIAGNOSIS — Z794 Long term (current) use of insulin: Secondary | ICD-10-CM | POA: Diagnosis not present

## 2020-12-31 DIAGNOSIS — D508 Other iron deficiency anemias: Secondary | ICD-10-CM

## 2020-12-31 DIAGNOSIS — J0100 Acute maxillary sinusitis, unspecified: Secondary | ICD-10-CM | POA: Diagnosis not present

## 2020-12-31 DIAGNOSIS — Z6841 Body Mass Index (BMI) 40.0 and over, adult: Secondary | ICD-10-CM

## 2020-12-31 DIAGNOSIS — R300 Dysuria: Secondary | ICD-10-CM

## 2020-12-31 MED ORDER — OXYCODONE HCL 5 MG PO TABS: 5.0000 mg | ORAL_TABLET | ORAL | 0 refills | Status: DC | PRN

## 2020-12-31 MED ORDER — AMOXICILLIN-POT CLAVULANATE 875-125 MG PO TABS: 1.0000 | ORAL_TABLET | Freq: Two times a day (BID) | ORAL | 0 refills | Status: DC

## 2020-12-31 MED ORDER — COLCHICINE 0.6 MG PO TABS
0.6000 mg | ORAL_TABLET | Freq: Two times a day (BID) | ORAL | 0 refills | Status: DC
Start: 2020-12-31 — End: 2021-03-12

## 2020-12-31 MED ORDER — FEBUXOSTAT 40 MG PO TABS: 40.0000 mg | ORAL_TABLET | Freq: Every day | ORAL | 2 refills | Status: DC

## 2020-12-31 NOTE — Progress Notes (Signed)
[facility] [place], [place] [id number]  Internal MEDICINE  Office Visit Note  Patient Name: Jason Hudson  [id number]  [id number]  Date of Service: 01/01/2021  Chief Complaint  Patient presents with  . Follow-up    Right ear pain, retaining fluid, and legs have been sore, refill on meds  . COPD  . Gastroesophageal Reflux  . Hypertension  . Diabetes  . Congestive Heart Failure         HPI Patient is here for routine follow-up of multiple medical conditions -Has been having cough and sinus congestion for awhile, several weeks. Gout in right wrist is still a problem. -ear pain in right ear got better with antibiotc last visit but came back. Taking claritin and pseudophed D. Flonase every day. Needs refill of colchicine today. Never got the fuboxostat, has been taking the allopurinol still.  -Had not been using symbicort bc insurance wont pay for it. Using Albuterol 2-3x per day. And has used nebulizer a few times per day. Some SOB and wheezing.  Has upcoming pulmonary visit. -Followed by nephrology, who has been managing his fluid pills and is on lasix BID and metolazone. -Sees Endocrinology for diabetes management. -BP 120-140/50-80 at home -Saw cardiology last month. -Went to [facility] in January and that was when he had a gout flare, and they are supposed to send info about hospital stay then and for echo results to go to cardiology -Reviewed labs which did show  low hemoglobin that is improving, but very low iron. We will supplement iron.  Colonoscopy is up-to-date.  Patient denies any signs of blood loss.  May be chronically anemic due to CKD stage IV  Current Medication: Outpatient Encounter Medications as of 12/31/2020  Medication Sig Note  . albuterol (VENTOLIN HFA) 108 (90 Base) MCG/ACT inhaler Inhale 1-2 puffs into the lungs every 6 (six) hours as needed for wheezing or shortness of breath.   . allopurinol (ZYLOPRIM) 100 MG tablet TAKE 1  TABLET BY MOUTH DAILY   . amoxicillin-clavulanate (AUGMENTIN) 875-125 MG tablet Take 1 tablet by mouth 2 (two) times daily.   Marland Kitchen aspirin EC 81 MG tablet Take 81 mg by mouth daily. Swallow whole.   . carvedilol (COREG) 25 MG tablet TAKE ONE TABLET TWICE DAILY   . carvedilol (COREG) 6.25 MG tablet Take 6.25 mg by mouth 2 (two) times daily with a meal.   . Cholecalciferol (VITAMIN D3) 50 MCG (2000 UT) TABS Take 2,000 Units by mouth at bedtime.   . ciprofloxacin (CIPRO) 500 MG tablet Take 1 tablet (500 mg total) by mouth 2 (two) times daily.   Marland Kitchen diltiazem (TIAZAC) 360 MG 24 hr capsule Take 1 capsule (360 mg total) by mouth daily.   Marland Kitchen glipiZIDE (GLUCOTROL XL) 10 MG 24 hr tablet TAKE ONE TABLET EVERY DAY   . metolazone (ZAROXOLYN) 5 MG tablet TAKE 1 TABLET BY MOUTH DAILY FOR SWELLING   . multivitamin (ONE-A-DAY MEN'S) TABS tablet Take 1 tablet by mouth daily.   . nitroGLYCERIN (NITROSTAT) 0.4 MG SL tablet Place 1 tablet (0.4 mg total) under the tongue every 5 (five) minutes as needed for chest pain.   . pantoprazole (PROTONIX) 40 MG tablet TAKE 1 TABLET BY MOUTH DAILY   . SYMBICORT 160-4.5 MCG/ACT inhaler TAKE 2 PUFFS BY MOUTH TWICE A DAY   . [DISCONTINUED] colchicine 0.6 MG tablet Take 1 tablet (0.6 mg total) by mouth 2 (two) times daily.   . [DISCONTINUED] oxyCODONE (ROXICODONE) 5 MG immediate  release tablet Take 1 tablet (5 mg total) by mouth every 4 (four) hours as needed for severe pain.   Marland Kitchen amiodarone (PACERONE) 200 MG tablet Take 1 tablet (200 mg total) by mouth daily for 30 days.   . colchicine 0.6 MG tablet Take 1 tablet (0.6 mg total) by mouth 2 (two) times daily.   Marland Kitchen doxazosin (CARDURA) 4 MG tablet TAKE 1 TABLET BY MOUTH AT BEDTIME   . Dulaglutide (TRULICITY) 1.5 ZO/1.0RU SOPN Inject 1.5 mg into the skin every Wednesday.   . febuxostat (ULORIC) 40 MG tablet Take 1 tablet (40 mg total) by mouth daily.   . fluticasone (FLONASE) 50 MCG/ACT nasal spray Place 2 sprays into both nostrils 2 (two)  times daily.    . furosemide (LASIX) 80 MG tablet Take 1 tablet (80 mg total) by mouth 2 (two) times daily.   . Insulin Degludec (TRESIBA FLEXTOUCH) 200 UNIT/ML SOPN    . ipratropium (ATROVENT) 0.02 % nebulizer solution Take 0.5 mg by nebulization every 6 (six) hours as needed for wheezing or shortness of breath.   Marland Kitchen ipratropium (ATROVENT) 0.02 % nebulizer solution Take 0.5 mg by nebulization 4 (four) times daily.   Marland Kitchen levothyroxine (SYNTHROID) 88 MCG tablet Take 1 tablet (88 mcg total) by mouth daily.   Marland Kitchen loratadine (CLARITIN) 10 MG tablet Take 10 mg by mouth daily.  08/24/2018: Takes liqui-gels at home  . oxyCODONE (ROXICODONE) 5 MG immediate release tablet Take 1 tablet (5 mg total) by mouth every 4 (four) hours as needed for severe pain.   . potassium chloride (KLOR-CON) 10 MEQ tablet TAKE ONE TABLET BY MOUTH EVERY DAY   . potassium chloride (MICRO-K) 10 MEQ CR capsule Take 1 capsule (10 mEq total) by mouth daily. Take one to to tabs every day   . predniSONE (DELTASONE) 10 MG tablet Take 1 tablet three times a day with a meal for three for three days, take 1 tablet by twice daily with a meal for 3 days, take 1 tablet once daily with a meal for 3 days   . [DISCONTINUED] febuxostat (ULORIC) 40 MG tablet Take 1 tablet (40 mg total) by mouth daily.    No facility-administered encounter medications on file as of 12/31/2020.    Surgical History: Past Surgical History:  Procedure Laterality Date  . COLONOSCOPY WITH PROPOFOL N/A 11/07/2017   Procedure: COLONOSCOPY WITH PROPOFOL;  Surgeon: [name], MD;  Location: [facility] ENDOSCOPY;  Service: Gastroenterology;  Laterality: N/A;  . ESOPHAGOGASTRODUODENOSCOPY (EGD) WITH PROPOFOL N/A 11/07/2017   Procedure: ESOPHAGOGASTRODUODENOSCOPY (EGD) WITH PROPOFOL;  Surgeon: [name], MD;  Location: [facility] ENDOSCOPY;  Service: Gastroenterology;  Laterality: N/A;  . LEFT HEART CATH AND CORONARY ANGIOGRAPHY N/A 10/23/2017   Procedure: LEFT HEART CATH AND CORONARY  ANGIOGRAPHY;  Surgeon: Isaias Cowman, MD;  Location: [place] CV LAB;  Service: Cardiovascular;  Laterality: N/A;    Medical History: Past Medical History:  Diagnosis Date  . CHF (congestive heart failure) ([place])   . COPD (chronic obstructive pulmonary disease) ([place])   . Diabetes mellitus without complication ([place])   . Edema    leg swelling  . Gastroesophageal reflux disease 12/15/2017  . Gout   . Hypertension   . Melena 11/05/2017  . Pneumonia 07/27/2016    Family History: Family History  Problem Relation Age of Onset  . Cancer Mother   . CVA Father   . Heart disease Father     Social History   Socioeconomic History  . Marital status: Single  Spouse name: Not on file  . Number of children: Not on file  . Years of education: Not on file  . Highest education level: Not on file  Occupational History  . Not on file  Tobacco Use  . Smoking status: Never Smoker  . Smokeless tobacco: Never Used  Substance and Sexual Activity  . Alcohol use: Not Currently    Comment: ocassionally  . Drug use: Never  . Sexual activity: Not on file  Other Topics Concern  . Not on file  Social History Narrative  . Not on file   Social Determinants of Health   Financial Resource Strain: Not on file  Food Insecurity: Not on file  Transportation Needs: Not on file  Physical Activity: Not on file  Stress: Not on file  Social Connections: Not on file  Intimate Partner Violence: Not on file      Review of Systems  Constitutional: Positive for fatigue. Negative for chills and unexpected weight change.  HENT: Positive for congestion, ear pain, postnasal drip and sinus pressure. Negative for rhinorrhea, sneezing and sore throat.   Eyes: Negative for redness.  Respiratory: Positive for shortness of breath. Negative for cough, chest tightness and wheezing.   Cardiovascular: Positive for leg swelling. Negative for chest pain and palpitations.  Gastrointestinal: Negative for  abdominal pain, constipation, diarrhea, nausea and vomiting.  Genitourinary: Negative for dysuria and frequency.  Musculoskeletal: Positive for arthralgias. Negative for back pain, joint swelling and neck pain.       Right wrist pain from gout flare  Skin: Negative for rash.  Neurological: Negative.  Negative for tremors, numbness and headaches.  Hematological: Negative for adenopathy. Does not bruise/bleed easily.  Psychiatric/Behavioral: Negative for behavioral problems (Depression), sleep disturbance and suicidal ideas. The patient is not nervous/anxious.     Vital Signs: BP 120/72   Pulse 66   Temp 98.2 F (36.8 C)   Resp 16   Ht 5\' 9"  (1.753 m)   Wt (!) 313 lb 9.6 oz (142.2 kg)   SpO2 93%   BMI 46.31 kg/m    Physical Exam Vitals and nursing note reviewed.  Constitutional:      General: He is not in acute distress.    Appearance: He is well-developed. He is obese. He is not diaphoretic.  HENT:     Head: Normocephalic and atraumatic.     Right Ear: Tympanic membrane normal.     Left Ear: Tympanic membrane normal.     Nose: Congestion present.     Mouth/Throat:     Pharynx: No oropharyngeal exudate.  Eyes:     Pupils: Pupils are equal, round, and reactive to light.  Neck:     Thyroid: No thyromegaly.     Vascular: No JVD.     Trachea: No tracheal deviation.  Cardiovascular:     Rate and Rhythm: Normal rate and regular rhythm.     Heart sounds: Normal heart sounds. No murmur heard. No friction rub. No gallop.   Pulmonary:     Effort: Pulmonary effort is normal. No respiratory distress.     Breath sounds: No wheezing or rales.  Chest:     Chest wall: No tenderness.  Abdominal:     General: Bowel sounds are normal.     Palpations: Abdomen is soft.  Musculoskeletal:        General: Normal range of motion.     Cervical back: Normal range of motion and neck supple.     Comments: Pain in right  wrist  Lymphadenopathy:     Cervical: No cervical adenopathy.  Skin:     General: Skin is warm and dry.  Neurological:     Mental Status: He is alert and oriented to person, place, and time.     Cranial Nerves: No cranial nerve deficit.  Psychiatric:        Behavior: Behavior normal.        Thought Content: Thought content normal.        Judgment: Judgment normal.        Assessment/Plan: 1. Acute gout due to renal impairment involving right wrist Will start Uloric for gout prevention, colchicine refill sent for acute flares.  Limited oxycodone prescription sent due to pain from gout flare - colchicine 0.6 MG tablet; Take 1 tablet (0.6 mg total) by mouth 2 (two) times daily.  Dispense: 30 tablet; Refill: 0 - oxyCODONE (ROXICODONE) 5 MG immediate release tablet; Take 1 tablet (5 mg total) by mouth every 4 (four) hours as needed for severe pain.  Dispense: 30 tablet; Refill: 0 - febuxostat (ULORIC) 40 MG tablet; Take 1 tablet (40 mg total) by mouth daily.  Dispense: 30 tablet; Refill: 2 [place] Controlled Substance Database was reviewed by me for overdose risk score (ORS)  2. Acute non-recurrent maxillary sinusitis Will start Augmentin twice a day with meals.  Also encouraged to use Flonase, continue antihistamine, and utilize Mucinex for congestion - amoxicillin-clavulanate (AUGMENTIN) 875-125 MG tablet; Take 1 tablet by mouth 2 (two) times daily.  Dispense: 20 tablet; Refill: 0  3. Hypertension associated with diabetes ([place]) Stable continue current therapy  4. Type 2 diabetes mellitus with stage 4 chronic kidney disease, with long-term current use of insulin ([place]) Followed by endocrinology  5. Acquired hypothyroidism Increase Synthroid to 88 mcg after last labs, will need repeat lab work and adjust dose as indicated  6. Chronic diastolic congestive heart failure ([place]) Followed by cardiology, they are working to obtain updated echo from hospital in [place] from January given increased fluid retention  7. Other iron deficiency Will take daily iron  supplement and monitor labs.  Colonoscopy is up-to-date.  General Counseling: Malva Cogan understanding of the findings of todays visit and agrees with plan of treatment. I have discussed any further diagnostic evaluation that may be needed or ordered today. We also reviewed his medications today. he has been encouraged to call the office with any questions or concerns that should arise related to todays visit.    Orders Placed This Encounter  Procedures  . TSH + free T4    Meds ordered this encounter  Medications  . amoxicillin-clavulanate (AUGMENTIN) 875-125 MG tablet    Sig: Take 1 tablet by mouth 2 (two) times daily.    Dispense:  20 tablet    Refill:  0  . colchicine 0.6 MG tablet    Sig: Take 1 tablet (0.6 mg total) by mouth 2 (two) times daily.    Dispense:  30 tablet    Refill:  0  . oxyCODONE (ROXICODONE) 5 MG immediate release tablet    Sig: Take 1 tablet (5 mg total) by mouth every 4 (four) hours as needed for severe pain.    Dispense:  30 tablet    Refill:  0  . febuxostat (ULORIC) 40 MG tablet    Sig: Take 1 tablet (40 mg total) by mouth daily.    Dispense:  30 tablet    Refill:  2    This patient was seen by Drema Dallas, PA-C  in collaboration with Dr. Clayborn Bigness as a part of collaborative care agreement.   Total time spent:40 Minutes Time spent includes review of chart, medications, test results, and follow up plan with the patient.      Dr Lavera Guise Internal medicine

## 2021-01-05 ENCOUNTER — Ambulatory Visit: Payer: Medicare HMO | Admitting: Internal Medicine

## 2021-01-11 DIAGNOSIS — G4733 Obstructive sleep apnea (adult) (pediatric): Secondary | ICD-10-CM | POA: Diagnosis not present

## 2021-02-02 ENCOUNTER — Ambulatory Visit: Payer: HMO | Admitting: Internal Medicine

## 2021-02-03 DIAGNOSIS — G4733 Obstructive sleep apnea (adult) (pediatric): Secondary | ICD-10-CM | POA: Diagnosis not present

## 2021-02-03 DIAGNOSIS — R0609 Other forms of dyspnea: Secondary | ICD-10-CM | POA: Diagnosis not present

## 2021-02-03 DIAGNOSIS — I10 Essential (primary) hypertension: Secondary | ICD-10-CM | POA: Diagnosis not present

## 2021-02-03 DIAGNOSIS — R600 Localized edema: Secondary | ICD-10-CM | POA: Diagnosis not present

## 2021-02-03 DIAGNOSIS — I5023 Acute on chronic systolic (congestive) heart failure: Secondary | ICD-10-CM | POA: Diagnosis not present

## 2021-02-03 DIAGNOSIS — N184 Chronic kidney disease, stage 4 (severe): Secondary | ICD-10-CM | POA: Diagnosis not present

## 2021-02-03 DIAGNOSIS — I480 Paroxysmal atrial fibrillation: Secondary | ICD-10-CM | POA: Diagnosis not present

## 2021-02-03 DIAGNOSIS — E7800 Pure hypercholesterolemia, unspecified: Secondary | ICD-10-CM | POA: Diagnosis not present

## 2021-02-03 DIAGNOSIS — I2510 Atherosclerotic heart disease of native coronary artery without angina pectoris: Secondary | ICD-10-CM | POA: Diagnosis not present

## 2021-02-03 DIAGNOSIS — J449 Chronic obstructive pulmonary disease, unspecified: Secondary | ICD-10-CM | POA: Diagnosis not present

## 2021-02-03 DIAGNOSIS — E119 Type 2 diabetes mellitus without complications: Secondary | ICD-10-CM | POA: Diagnosis not present

## 2021-02-10 DIAGNOSIS — R0609 Other forms of dyspnea: Secondary | ICD-10-CM | POA: Diagnosis not present

## 2021-02-10 DIAGNOSIS — I480 Paroxysmal atrial fibrillation: Secondary | ICD-10-CM | POA: Diagnosis not present

## 2021-02-10 DIAGNOSIS — I5023 Acute on chronic systolic (congestive) heart failure: Secondary | ICD-10-CM | POA: Diagnosis not present

## 2021-02-10 DIAGNOSIS — E7800 Pure hypercholesterolemia, unspecified: Secondary | ICD-10-CM | POA: Diagnosis not present

## 2021-02-10 DIAGNOSIS — G4733 Obstructive sleep apnea (adult) (pediatric): Secondary | ICD-10-CM | POA: Diagnosis not present

## 2021-02-10 DIAGNOSIS — I2510 Atherosclerotic heart disease of native coronary artery without angina pectoris: Secondary | ICD-10-CM | POA: Diagnosis not present

## 2021-02-10 DIAGNOSIS — R600 Localized edema: Secondary | ICD-10-CM | POA: Diagnosis not present

## 2021-02-10 DIAGNOSIS — J449 Chronic obstructive pulmonary disease, unspecified: Secondary | ICD-10-CM | POA: Diagnosis not present

## 2021-02-10 DIAGNOSIS — N184 Chronic kidney disease, stage 4 (severe): Secondary | ICD-10-CM | POA: Diagnosis not present

## 2021-02-10 DIAGNOSIS — E119 Type 2 diabetes mellitus without complications: Secondary | ICD-10-CM | POA: Diagnosis not present

## 2021-02-10 DIAGNOSIS — I10 Essential (primary) hypertension: Secondary | ICD-10-CM | POA: Diagnosis not present

## 2021-02-11 DIAGNOSIS — G4733 Obstructive sleep apnea (adult) (pediatric): Secondary | ICD-10-CM | POA: Diagnosis not present

## 2021-02-15 DIAGNOSIS — R809 Proteinuria, unspecified: Secondary | ICD-10-CM | POA: Insufficient documentation

## 2021-02-15 DIAGNOSIS — E1122 Type 2 diabetes mellitus with diabetic chronic kidney disease: Secondary | ICD-10-CM | POA: Diagnosis not present

## 2021-02-15 DIAGNOSIS — N184 Chronic kidney disease, stage 4 (severe): Secondary | ICD-10-CM | POA: Diagnosis not present

## 2021-02-15 DIAGNOSIS — I129 Hypertensive chronic kidney disease with stage 1 through stage 4 chronic kidney disease, or unspecified chronic kidney disease: Secondary | ICD-10-CM | POA: Diagnosis not present

## 2021-02-15 DIAGNOSIS — N189 Chronic kidney disease, unspecified: Secondary | ICD-10-CM | POA: Diagnosis not present

## 2021-02-15 DIAGNOSIS — N2581 Secondary hyperparathyroidism of renal origin: Secondary | ICD-10-CM | POA: Diagnosis not present

## 2021-02-15 DIAGNOSIS — I509 Heart failure, unspecified: Secondary | ICD-10-CM | POA: Diagnosis not present

## 2021-02-16 ENCOUNTER — Ambulatory Visit
Admission: RE | Admit: 2021-02-16 | Discharge: 2021-02-16 | Disposition: A | Discharge status: Home / Self Care | Payer: HMO | Attending: Internal Medicine | Admitting: Internal Medicine

## 2021-02-16 ENCOUNTER — Other Ambulatory Visit: Payer: Self-pay

## 2021-02-16 ENCOUNTER — Encounter: Payer: Self-pay | Admitting: Internal Medicine

## 2021-02-16 ENCOUNTER — Ambulatory Visit
Admission: RE | Admit: 2021-02-16 | Discharge: 2021-02-16 | Disposition: A | Discharge status: Home / Self Care | Payer: HMO | Source: Ambulatory Visit | Attending: Internal Medicine | Admitting: Internal Medicine

## 2021-02-16 ENCOUNTER — Ambulatory Visit (INDEPENDENT_AMBULATORY_CARE_PROVIDER_SITE_OTHER): Payer: HMO | Admitting: Internal Medicine

## 2021-02-16 VITALS — BP 127/60 | HR 64 | Temp 97.8°F | Resp 16 | Ht 69.0 in | Wt 327.0 lb

## 2021-02-16 DIAGNOSIS — R059 Cough, unspecified: Secondary | ICD-10-CM | POA: Diagnosis not present

## 2021-02-16 DIAGNOSIS — J449 Chronic obstructive pulmonary disease, unspecified: Secondary | ICD-10-CM | POA: Diagnosis not present

## 2021-02-16 DIAGNOSIS — N184 Chronic kidney disease, stage 4 (severe): Secondary | ICD-10-CM

## 2021-02-16 DIAGNOSIS — G4733 Obstructive sleep apnea (adult) (pediatric): Secondary | ICD-10-CM

## 2021-02-16 DIAGNOSIS — Z7189 Other specified counseling: Secondary | ICD-10-CM | POA: Diagnosis not present

## 2021-02-16 DIAGNOSIS — I5032 Chronic diastolic (congestive) heart failure: Secondary | ICD-10-CM

## 2021-02-16 DIAGNOSIS — R0602 Shortness of breath: Secondary | ICD-10-CM

## 2021-02-16 DIAGNOSIS — J019 Acute sinusitis, unspecified: Secondary | ICD-10-CM | POA: Diagnosis not present

## 2021-02-16 NOTE — Progress Notes (Signed)
[facility] [place], [place] [address]  Pulmonary Sleep Medicine   Office Visit Note  Patient Name: Jason Hudson DOB: 1951/08/21 MRN [id number]  Date of Service: 02/16/2021  Complaints/HPI: He states that he had some sinus troubles. He did take augmentin and this helped. Now feels that it may be coming back.Notes there is some SOB. He was not tested for covid. This was 2 weeks ago. Patient states that he has no headaches little bit dizziness noted.   ROS  General: (-) fever, (-) chills, (-) night sweats, (-) weakness Skin: (-) rashes, (-) itching,. Eyes: (-) visual changes, (-) redness, (-) itching. Nose and Sinuses: (-) nasal stuffiness or itchiness, (-) postnasal drip, (-) nosebleeds, (-) sinus trouble. Mouth and Throat: (-) sore throat, (-) hoarseness. Neck: (-) swollen glands, (-) enlarged thyroid, (-) neck pain. Respiratory: + cough, (-) bloody sputum, + shortness of breath, - wheezing. Cardiovascular: - ankle swelling, (-) chest pain. Lymphatic: (-) lymph node enlargement. Neurologic: (-) numbness, (-) tingling. Psychiatric: (-) anxiety, (-) depression   Current Medication: Outpatient Encounter Medications as of 02/16/2021  Medication Sig Note   albuterol (VENTOLIN HFA) 108 (90 Base) MCG/ACT inhaler Inhale 1-2 puffs into the lungs every 6 (six) hours as needed for wheezing or shortness of breath.    aspirin EC 81 MG tablet Take 81 mg by mouth daily. Swallow whole.    carvedilol (COREG) 6.25 MG tablet Take 6.25 mg by mouth 2 (two) times daily with a meal.    Cholecalciferol (VITAMIN D3) 50 MCG (2000 UT) TABS Take 2,000 Units by mouth at bedtime.    colchicine 0.6 MG tablet Take 1 tablet (0.6 mg total) by mouth 2 (two) times daily.    diltiazem (TIAZAC) 360 MG 24 hr capsule Take 1 capsule (360 mg total) by mouth daily.    doxazosin (CARDURA) 4 MG tablet TAKE 1 TABLET BY MOUTH AT BEDTIME    febuxostat (ULORIC) 40 MG tablet Take 1 tablet  (40 mg total) by mouth daily.    fluticasone (FLONASE) 50 MCG/ACT nasal spray Place 2 sprays into both nostrils 2 (two) times daily.     furosemide (LASIX) 80 MG tablet Take 1 tablet (80 mg total) by mouth 2 (two) times daily.    glipiZIDE (GLUCOTROL XL) 10 MG 24 hr tablet TAKE ONE TABLET EVERY DAY    insulin degludec (TRESIBA) 200 UNIT/ML FlexTouch Pen 120 Units. Given by endo    ipratropium (ATROVENT) 0.02 % nebulizer solution Take 0.5 mg by nebulization every 6 (six) hours as needed for wheezing or shortness of breath.    loratadine (CLARITIN) 10 MG tablet Take 10 mg by mouth daily.  08/24/2018: Takes liqui-gels at home   metolazone (ZAROXOLYN) 5 MG tablet TAKE 1 TABLET BY MOUTH DAILY FOR SWELLING    multivitamin (ONE-A-DAY MEN'S) TABS tablet Take 1 tablet by mouth daily.    nitroGLYCERIN (NITROSTAT) 0.4 MG SL tablet Place 1 tablet (0.4 mg total) under the tongue every 5 (five) minutes as needed for chest pain.    oxyCODONE (ROXICODONE) 5 MG immediate release tablet Take 1 tablet (5 mg total) by mouth every 4 (four) hours as needed for severe pain.    pantoprazole (PROTONIX) 40 MG tablet TAKE 1 TABLET BY MOUTH DAILY    potassium chloride (KLOR-CON) 10 MEQ tablet TAKE ONE TABLET BY MOUTH EVERY DAY    SYMBICORT 160-4.5 MCG/ACT inhaler TAKE 2 PUFFS BY MOUTH TWICE A DAY    amiodarone (PACERONE) 200 MG tablet  Take 1 tablet (200 mg total) by mouth daily for 30 days.    [DISCONTINUED] allopurinol (ZYLOPRIM) 100 MG tablet TAKE 1 TABLET BY MOUTH DAILY (Patient not taking: Reported on 02/16/2021)    [DISCONTINUED] amoxicillin-clavulanate (AUGMENTIN) 875-125 MG tablet Take 1 tablet by mouth 2 (two) times daily.    [DISCONTINUED] carvedilol (COREG) 25 MG tablet TAKE ONE TABLET TWICE DAILY (Patient not taking: Reported on 02/16/2021)    [DISCONTINUED] ciprofloxacin (CIPRO) 500 MG tablet Take 1 tablet (500 mg total) by mouth 2 (two) times daily.    [DISCONTINUED] Dulaglutide (TRULICITY) 1.5 ZW/2.5EN SOPN Inject  1.5 mg into the skin every Wednesday. (Patient not taking: Reported on 02/16/2021)    [DISCONTINUED] ipratropium (ATROVENT) 0.02 % nebulizer solution Take 0.5 mg by nebulization 4 (four) times daily.    [DISCONTINUED] levothyroxine (SYNTHROID) 88 MCG tablet Take 1 tablet (88 mcg total) by mouth daily. (Patient not taking: Reported on 02/16/2021)    [DISCONTINUED] potassium chloride (MICRO-K) 10 MEQ CR capsule Take 1 capsule (10 mEq total) by mouth daily. Take one to to tabs every day    [DISCONTINUED] predniSONE (DELTASONE) 10 MG tablet Take 1 tablet three times a day with a meal for three for three days, take 1 tablet by twice daily with a meal for 3 days, take 1 tablet once daily with a meal for 3 days    No facility-administered encounter medications on file as of 02/16/2021.    Surgical History: Past Surgical History:  Procedure Laterality Date   COLONOSCOPY WITH PROPOFOL N/A 11/07/2017   Procedure: COLONOSCOPY WITH PROPOFOL;  Surgeon: [name], MD;  Location: [facility] ENDOSCOPY;  Service: Gastroenterology;  Laterality: N/A;   ESOPHAGOGASTRODUODENOSCOPY (EGD) WITH PROPOFOL N/A 11/07/2017   Procedure: ESOPHAGOGASTRODUODENOSCOPY (EGD) WITH PROPOFOL;  Surgeon: [name], MD;  Location: [facility] ENDOSCOPY;  Service: Gastroenterology;  Laterality: N/A;   LEFT HEART CATH AND CORONARY ANGIOGRAPHY N/A 10/23/2017   Procedure: LEFT HEART CATH AND CORONARY ANGIOGRAPHY;  Surgeon: Isaias Cowman, MD;  Location: [facility];  Service: Cardiovascular;  Laterality: N/A;    Medical History: Past Medical History:  Diagnosis Date   CHF (congestive heart failure) (HCC)    COPD (chronic obstructive pulmonary disease) (HCC)    Diabetes mellitus without complication (HCC)    Edema    leg swelling   Gastroesophageal reflux disease 12/15/2017   Gout    Hypertension    Melena 11/05/2017   Pneumonia 07/27/2016    Family History: Family History  Problem Relation Age of Onset   Cancer Mother    CVA  Father    Heart disease Father     Social History: Social History   Socioeconomic History   Marital status: Single    Spouse name: Not on file   Number of children: Not on file   Years of education: Not on file   Highest education level: Not on file  Occupational History   Not on file  Tobacco Use   Smoking status: Never   Smokeless tobacco: Never  Substance and Sexual Activity   Alcohol use: Not Currently    Comment: ocassionally   Drug use: Never   Sexual activity: Not on file  Other Topics Concern   Not on file  Social History Narrative   Not on file   Social Determinants of Health   Financial Resource Strain: Not on file  Food Insecurity: Not on file  Transportation Needs: Not on file  Physical Activity: Not on file  Stress: Not on file  Social Connections: Not on file  Intimate Partner Violence: Not on file    Vital Signs: Blood pressure 127/60, pulse 64, temperature 97.8 F (36.6 C), resp. rate 16, height 5\' 9"  (1.753 m), weight (!) 327 lb (148.3 kg), SpO2 94 %.  Examination: General Appearance: The patient is well-developed, well-nourished, and in no distress. Skin: Gross inspection of skin unremarkable. Head: normocephalic, no gross deformities. Eyes: no gross deformities noted. ENT: ears appear grossly normal no exudates. Neck: Supple. No thyromegaly. No LAD. Respiratory: few rhonchi noted. Cardiovascular: Normal S1 and S2 without murmur or rub. Extremities: No cyanosis. pulses are equal. Neurologic: Alert and oriented. No involuntary movements.  LABS: No results found for this or any previous visit (from the past 2160 hour(s)).  Radiology: No results found.  No results found.  No results found.    Assessment and Plan: Patient Active Problem List   Diagnosis Date Noted   Low back pain due to bilateral sciatica 02/19/2020   Encounter for general adult medical examination with abnormal findings 08/31/2019   Flu vaccine need 08/31/2019    Need for vaccination against Streptococcus pneumoniae using pneumococcal conjugate vaccine 13 08/31/2019   Dysuria 08/31/2019   Irritable bowel syndrome with constipation 05/29/2019   Type 2 diabetes mellitus with hyperglycemia ([place]) 03/17/2019   Lower extremity edema 12/08/2018   Diabetic ulcer of calf ([place]) 11/10/2018   Restrictive lung disease 10/26/2018   Acute exacerbation of CHF (congestive heart failure) ([place]) 09/12/2018   Acute on chronic systolic congestive heart failure ([place]) 09/06/2018   AKI (acute kidney injury) ([place]) 09/06/2018   Moderate intermittent asthma without complication 86/76/7209   Coronary arteriosclerosis 47/04/6282   Diastolic dysfunction 66/29/4765   Hypercholesterolemia 08/30/2018   Obesity 08/30/2018   COPD exacerbation ([place]) 08/24/2018   Hyperlipidemia due to type 2 diabetes mellitus ([place]) 03/28/2018   Type 2 diabetes mellitus with chronic kidney disease, with long-term current use of insulin ([place]) 03/28/2018   Essential hypertension 01/19/2018   Chronic obstructive lung disease ([place]) 12/15/2017   Gastroesophageal reflux disease 12/15/2017   Obstructive sleep apnea of adult 12/15/2017   Melena 11/05/2017   Congestive heart failure ([place]) 10/20/2017   Pneumonia 07/27/2016     1. Shortness of breath  - Spirometry with Graph  2. OSA (obstructive sleep apnea) On CPAP compliant will continue. He does not have a humidifier on his machine. Could possibly be causing some of his sinus issues  3. Obesity, morbid ([place]) Obesity Counseling: Had a lengthy discussion regarding patients BMI and weight issues. Patient was instructed on portion control as well as increased activity. Also discussed caloric restrictions with trying to maintain intake less than 2000 Kcal. Discussions were made in accordance with the 5As of weight management. Simple actions such as not eating late and if able to, taking a walk is suggested.   4. Chronic kidney disease (CKD) stage G4/A1,  severely decreased glomerular filtration rate (GFR) between 15-29 mL/min/1.73 square meter and albuminuria creatinine ratio less than 30 mg/g (HCC)   5. Chronic diastolic congestive heart failure [facility]) Being seen by cardiology  6. CPAP use counseling CPAP Counseling: had a lengthy discussion with the patient regarding the importance of PAP therapy in management of the sleep apnea. Patient appears to understand the risk factor reduction and also understands the risks associated with untreated sleep apnea. Patient will try to make a good faith effort to remain compliant with therapy. Also instructed the patient on proper cleaning of the device including the water must be  changed daily if possible and use of distilled water is preferred. Patient understands that the machine should be regularly cleaned with appropriate recommended cleaning solutions that do not damage the PAP machine for example given white vinegar and water rinses. Other methods such as ozone treatment may not be as good as these simple methods to achieve cleaning.   7. Acute sinusitis, recurrence not specified, unspecified location Has been treated will monitor. I think not having ahumidifier may be contributing  8. Obstructive chronic bronchitis without exacerbation ([place])  - DG Chest 2 View; Future    General Counseling: I have discussed the findings of the evaluation and examination with Elenore Rota.  I have also discussed any further diagnostic evaluation thatmay be needed or ordered today. Brax verbalizes understanding of the findings of todays visit. We also reviewed his medications today and discussed drug interactions and side effects including but not limited excessive drowsiness and altered mental states. We also discussed that there is always a risk not just to him but also people around him. he has been encouraged to call the office with any questions or concerns that should arise related to todays visit.  Orders Placed This  Encounter  Procedures   Spirometry with Graph    Order Specific Question:   Where should this test be performed?    Answer:   Other    Order Specific Question:   Basic spirometry    Answer:   Yes     Time spent: 42  I have personally obtained a history, examined the patient, evaluated laboratory and imaging results, formulated the assessment and plan and placed orders.    Allyne Gee, MD [facility] Pulmonary and Critical Care Sleep medicine

## 2021-02-16 NOTE — Patient Instructions (Signed)
Sleep Apnea Sleep apnea affects breathing during sleep. It causes breathing to stop for 10 seconds or more, or to become shallow. People with sleep apnea usually snoreloudly. It can also increase the risk of: Heart attack. Stroke. Being very overweight (obese). Diabetes. Heart failure. Irregular heartbeat. High blood pressure. The goal of treatment is to help you breathe normally again. What are the causes?  The most common cause of this condition is a collapsed or blocked airway. There are three kinds of sleep apnea: Obstructive sleep apnea. This is caused by a blocked or collapsed airway. Central sleep apnea. This happens when the brain does not send the right signals to the muscles that control breathing. Mixed sleep apnea. This is a combination of obstructive and central sleep apnea. What increases the risk? Being overweight. Smoking. Having a small airway. Being older. Being male. Drinking alcohol. Taking medicines to calm yourself (sedatives or tranquilizers). Having family members with the condition. Having a tongue or tonsils that are larger than normal. What are the signs or symptoms? Trouble staying asleep. Loud snoring. Headaches in the morning. Waking up gasping. Dry mouth or sore throat in the morning. Being sleepy or tired during the day. If you are sleepy or tired during the day, you may also: Not be able to focus your mind (concentrate). Forget things. Get angry a lot and have mood swings. Feel sad (depressed). Have changes in your personality. Have less interest in sex, if you are male. Be unable to have an erection, if you are male. How is this treated?  Sleeping on your side. Using a medicine to get rid of mucus in your nose (decongestant). Avoiding the use of alcohol, medicines to help you relax, or certain pain medicines (narcotics). Losing weight, if needed. Changing your diet. Quitting smoking. Using a machine to open your airway while you  sleep, such as: An oral appliance. This is a mouthpiece that shifts your lower jaw forward. A CPAP device. This device blows air through a mask when you breathe out (exhale). An EPAP device. This has valves that you put in each nostril. A BPAP device. This device blows air through a mask when you breathe in (inhale) and breathe out. Having surgery if other treatments do not work. Follow these instructions at home: Lifestyle Make changes that your doctor recommends. Eat a healthy diet. Lose weight if needed. Avoid alcohol, medicines to help you relax, and some pain medicines. Do not smoke or use any products that contain nicotine or tobacco. If you need help quitting, ask your doctor. General instructions Take over-the-counter and prescription medicines only as told by your doctor. If you were given a machine to use while you sleep, use it only as told by your doctor. If you are having surgery, make sure to tell your doctor you have sleep apnea. You may need to bring your device with you. Keep all follow-up visits. Contact a doctor if: The machine that you were given to use during sleep bothers you or does not seem to be working. You do not get better. You get worse. Get help right away if: Your chest hurts. You have trouble breathing in enough air. You have an uncomfortable feeling in your back, arms, or stomach. You have trouble talking. One side of your body feels weak. A part of your face is hanging down. These symptoms may be an emergency. Get help right away. Call your local emergency services (911 in the [place] Do not wait to see if the symptoms   will go away. Do not drive yourself to the hospital. Summary This condition affects breathing during sleep. The most common cause is a collapsed or blocked airway. The goal of treatment is to help you breathe normally while you sleep. This information is not intended to replace advice given to you by your health care provider. Make  sure you discuss any questions you have with your healthcare provider. Document Revised: 07/17/2020 Document Reviewed: 07/17/2020 Elsevier Patient Education  2022 [employer]. Chronic Obstructive Pulmonary Disease  Chronic obstructive pulmonary disease (COPD) is a long-term (chronic) lung problem. When you have COPD, it is hard for air to get in and out ofyour lungs. Usually the condition gets worse over time, and your lungs will never return tonormal. There are things you can do to keep yourself as healthy as possible. What are the causes? Smoking. This is the most common cause. Certain genes passed from parent to child (inherited). What increases the risk? Being exposed to secondhand smoke from cigarettes, pipes, or cigars. Being exposed to chemicals and other irritants, such as fumes and dust in the work environment. Having chronic lung conditions or infections. What are the signs or symptoms? Shortness of breath, especially during physical activity. A long-term cough with a large amount of thick mucus. Sometimes, the cough may not have any mucus (dry cough). Wheezing. Breathing quickly. Skin that looks gray or blue, especially in the fingers, toes, or lips. Feeling tired (fatigue). Weight loss. Chest tightness. Having infections often. Episodes when breathing symptoms become much worse (exacerbations). At the later stages of this disease, you may have swelling in the ankles, feet,or legs. How is this treated? Taking medicines. Quitting smoking, if you smoke. Rehabilitation. This includes steps to make your body work better. It may involve a team of specialists. Doing exercises. Making changes to your diet. Using oxygen. Lung surgery. Lung transplant. Comfort measures (palliative care). Follow these instructions at home: Medicines Take over-the-counter and prescription medicines only as told by your doctor. Talk to your doctor before taking any cough or allergy medicines.  You may need to avoid medicines that cause your lungs to be dry. Lifestyle If you smoke, stop smoking. Smoking makes the problem worse. Do not smoke or use any products that contain nicotine or tobacco. If you need help quitting, ask your doctor. Avoid being around things that make your breathing worse. This may include smoke, chemicals, and fumes. Stay active, but remember to rest as well. Learn and use tips on how to manage stress and control your breathing. Make sure you get enough sleep. Most adults need at least 7 hours of sleep every night. Eat healthy foods. Eat smaller meals more often. Rest before meals. Controlled breathing Learn and use tips on how to control your breathing as told by your doctor. Try: Breathing in (inhaling) through your nose for 1 second. Then, pucker your lips and breath out (exhale) through your lips for 2 seconds. Putting one hand on your belly (abdomen). Breathe in slowly through your nose for 1 second. Your hand on your belly should move out. Pucker your lips and breathe out slowly through your lips. Your hand on your belly should move in as you breathe out.  Controlled coughing Learn and use controlled coughing to clear mucus from your lungs. Follow these steps: Lean your head a little forward. Breathe in deeply. Try to hold your breath for 3 seconds. Keep your mouth slightly open while coughing 2 times. Spit any mucus out into a tissue. Rest  and do the steps again 1 or 2 times as needed. General instructions Make sure you get all the shots (vaccines) that your doctor recommends. Ask your doctor about a flu shot and a pneumonia shot. Use oxygen therapy and pulmonary rehabilitation if told by your doctor. If you need home oxygen therapy, ask your doctor if you should buy a tool to measure your oxygen level (oximeter). Make a COPD action plan with your doctor. This helps you to know what to do if you feel worse than usual. Manage any other conditions you  have as told by your doctor. Avoid going outside when it is very hot, cold, or humid. Avoid people who have a sickness you can catch (contagious). Keep all follow-up visits. Contact a doctor if: You cough up more mucus than usual. There is a change in the color or thickness of the mucus. It is harder to breathe than usual. Your breathing is faster than usual. You have trouble sleeping. You need to use your medicines more often than usual. You have trouble doing your normal activities such as getting dressed or walking around the house. Get help right away if: You have shortness of breath while resting. You have shortness of breath that stops you from: Being able to talk. Doing normal activities. Your chest hurts for longer than 5 minutes. Your skin color is more blue than usual. Your pulse oximeter shows that you have low oxygen for longer than 5 minutes. You have a fever. You feel too tired to breathe normally. These symptoms may represent a serious problem that is an emergency. Do not wait to see if the symptoms will go away. Get medical help right away. Call your local emergency services (911 in the [place] Do not drive yourself to the hospital. Summary Chronic obstructive pulmonary disease (COPD) is a long-term lung problem. The way your lungs work will never return to normal. Usually the condition gets worse over time. There are things you can do to keep yourself as healthy as possible. Take over-the-counter and prescription medicines only as told by your doctor. If you smoke, stop. Smoking makes the problem worse. This information is not intended to replace advice given to you by your health care provider. Make sure you discuss any questions you have with your healthcare provider. Document Revised: 06/16/2020 Document Reviewed: 06/16/2020 Elsevier Patient Education  2022 [employer].

## 2021-03-02 ENCOUNTER — Other Ambulatory Visit: Payer: Self-pay

## 2021-03-02 MED ORDER — LEVOTHYROXINE SODIUM 88 MCG PO TABS: 88.0000 ug | ORAL_TABLET | Freq: Every day | ORAL | 0 refills | Status: DC

## 2021-03-12 ENCOUNTER — Other Ambulatory Visit: Payer: Self-pay

## 2021-03-12 DIAGNOSIS — M10331 Gout due to renal impairment, right wrist: Secondary | ICD-10-CM

## 2021-03-12 DIAGNOSIS — K219 Gastro-esophageal reflux disease without esophagitis: Secondary | ICD-10-CM

## 2021-03-12 MED ORDER — COLCHICINE 0.6 MG PO TABS: 0.6000 mg | ORAL_TABLET | Freq: Two times a day (BID) | ORAL | 3 refills | Status: DC

## 2021-03-12 MED ORDER — PANTOPRAZOLE SODIUM 40 MG PO TBEC: 40.0000 mg | DELAYED_RELEASE_TABLET | Freq: Every day | ORAL | 3 refills | Status: DC

## 2021-03-13 DIAGNOSIS — G4733 Obstructive sleep apnea (adult) (pediatric): Secondary | ICD-10-CM | POA: Diagnosis not present

## 2021-03-22 ENCOUNTER — Other Ambulatory Visit: Payer: Self-pay

## 2021-03-22 DIAGNOSIS — J449 Chronic obstructive pulmonary disease, unspecified: Secondary | ICD-10-CM

## 2021-03-22 MED ORDER — ALBUTEROL SULFATE HFA 108 (90 BASE) MCG/ACT IN AERS: 1.0000 | INHALATION_SPRAY | Freq: Four times a day (QID) | RESPIRATORY_TRACT | 5 refills | Status: DC | PRN

## 2021-04-01 ENCOUNTER — Ambulatory Visit: Payer: HMO | Admitting: Nurse Practitioner

## 2021-04-05 ENCOUNTER — Ambulatory Visit: Payer: HMO | Admitting: Nurse Practitioner

## 2021-04-05 ENCOUNTER — Other Ambulatory Visit: Payer: Self-pay

## 2021-04-05 MED ORDER — POTASSIUM CHLORIDE CRYS ER 10 MEQ PO TBCR: 10.0000 meq | EXTENDED_RELEASE_TABLET | Freq: Every day | ORAL | 0 refills | Status: DC

## 2021-04-09 ENCOUNTER — Other Ambulatory Visit: Payer: Self-pay | Admitting: Physician Assistant

## 2021-04-09 DIAGNOSIS — I152 Hypertension secondary to endocrine disorders: Secondary | ICD-10-CM

## 2021-04-09 DIAGNOSIS — E1159 Type 2 diabetes mellitus with other circulatory complications: Secondary | ICD-10-CM

## 2021-04-13 ENCOUNTER — Ambulatory Visit (INDEPENDENT_AMBULATORY_CARE_PROVIDER_SITE_OTHER): Payer: HMO | Admitting: Nurse Practitioner

## 2021-04-13 ENCOUNTER — Other Ambulatory Visit: Payer: Self-pay

## 2021-04-13 ENCOUNTER — Encounter: Payer: Self-pay | Admitting: Nurse Practitioner

## 2021-04-13 VITALS — BP 138/72 | HR 60 | Temp 97.1°F | Resp 16 | Ht 69.0 in | Wt 319.4 lb

## 2021-04-13 DIAGNOSIS — E1165 Type 2 diabetes mellitus with hyperglycemia: Secondary | ICD-10-CM

## 2021-04-13 DIAGNOSIS — G4733 Obstructive sleep apnea (adult) (pediatric): Secondary | ICD-10-CM | POA: Diagnosis not present

## 2021-04-13 DIAGNOSIS — E876 Hypokalemia: Secondary | ICD-10-CM | POA: Diagnosis not present

## 2021-04-13 DIAGNOSIS — F5104 Psychophysiologic insomnia: Secondary | ICD-10-CM | POA: Diagnosis not present

## 2021-04-13 DIAGNOSIS — I10 Essential (primary) hypertension: Secondary | ICD-10-CM | POA: Diagnosis not present

## 2021-04-13 DIAGNOSIS — M10331 Gout due to renal impairment, right wrist: Secondary | ICD-10-CM

## 2021-04-13 DIAGNOSIS — Z6841 Body Mass Index (BMI) 40.0 and over, adult: Secondary | ICD-10-CM | POA: Diagnosis not present

## 2021-04-13 DIAGNOSIS — J449 Chronic obstructive pulmonary disease, unspecified: Secondary | ICD-10-CM

## 2021-04-13 DIAGNOSIS — Z79899 Other long term (current) drug therapy: Secondary | ICD-10-CM | POA: Diagnosis not present

## 2021-04-13 DIAGNOSIS — Z794 Long term (current) use of insulin: Secondary | ICD-10-CM | POA: Diagnosis not present

## 2021-04-13 DIAGNOSIS — M1A3310 Chronic gout due to renal impairment, right wrist, without tophus (tophi): Secondary | ICD-10-CM

## 2021-04-13 LAB — POCT URINE DRUG SCREEN
Methylenedioxyamphetamine: NOT DETECTED
POC Amphetamine UR: NOT DETECTED
POC BENZODIAZEPINES UR: NOT DETECTED
POC Barbiturate UR: NOT DETECTED
POC Cocaine UR: NOT DETECTED
POC Ecstasy UR: NOT DETECTED
POC Marijuana UR: NOT DETECTED
POC Methadone UR: NOT DETECTED
POC Methamphetamine UR: NOT DETECTED
POC Opiate Ur: NOT DETECTED
POC Oxycodone UR: NOT DETECTED
POC PHENCYCLIDINE UR: NOT DETECTED
POC TRICYCLICS UR: NOT DETECTED

## 2021-04-13 LAB — POCT GLYCOSYLATED HEMOGLOBIN (HGB A1C): Hemoglobin A1C: 9.1 % — AB (ref 4.0–5.6)

## 2021-04-13 MED ORDER — BREZTRI AEROSPHERE 160-9-4.8 MCG/ACT IN AERO: 2.0000 | INHALATION_SPRAY | Freq: Two times a day (BID) | RESPIRATORY_TRACT | 11 refills | Status: DC

## 2021-04-13 MED ORDER — OXYCODONE HCL 5 MG PO TABS: 5.0000 mg | ORAL_TABLET | ORAL | 0 refills | Status: DC | PRN

## 2021-04-13 MED ORDER — TRAZODONE HCL 50 MG PO TABS: 50.0000 mg | ORAL_TABLET | Freq: Every evening | ORAL | 0 refills | Status: DC | PRN

## 2021-04-13 MED ORDER — POTASSIUM CHLORIDE CRYS ER 10 MEQ PO TBCR: EXTENDED_RELEASE_TABLET | ORAL | 0 refills | Status: DC

## 2021-04-13 MED ORDER — FEBUXOSTAT 40 MG PO TABS: 40.0000 mg | ORAL_TABLET | Freq: Every day | ORAL | 2 refills | Status: DC

## 2021-04-13 NOTE — Progress Notes (Signed)
[facility] [place], [place] [address]  Internal MEDICINE  Office Visit Note  Patient Name: Jason Hudson  [id number]  [id number]  Date of Service: 04/13/2021  Chief Complaint  Patient presents with   Follow-up    Back pain, refills, legs and feet swelling    Hypertension   Diabetes   Quality Metric Gaps    shingrix    HPI Jermery presents for a follow up visit for medication refills, chronic back pain, BLE edema, hypertension and diabetes. Naheim had his gout medication switched from allopurinol to febuxostat. He reports that it is working better and he has not needed to use colchicine.  -His diabetes is managed by Dr. Honor Junes who he is not scheduled to see until October. A1C is 9.1 today.  -blood pressure is well controlled with current medications, he is managed by cardiology.  -He is on multiple diuretics, high dose lasix. He has metolazone as needed for when the edema is worse. He was taking metolazone daily last week and this week and it has helped some. Dr. Juleen China, nephrology, manages his diuretic medications. Encouraged patient to call their office next week if the edema worsens or does not improve.  -chronic back pain, manageable on most days per patient. Does not see a specialist for this problem.  -COPD and asthma-- patient's insurance will not cover symbicort, wants to try something different that his insurance will cover. -not sleeping well, wants to try something for sleep, melatonin not working.     Current Medication: Outpatient Encounter Medications as of 04/13/2021  Medication Sig Note   albuterol (VENTOLIN HFA) 108 (90 Base) MCG/ACT inhaler Inhale 1-2 puffs into the lungs every 6 (six) hours as needed for wheezing or shortness of breath.    aspirin EC 81 MG tablet Take 81 mg by mouth daily. Swallow whole.    Budeson-Glycopyrrol-Formoterol (BREZTRI AEROSPHERE) 160-9-4.8 MCG/ACT AERO Inhale 2 puffs into the lungs 2 (two) times  daily.    carvedilol (COREG) 6.25 MG tablet Take 6.25 mg by mouth 2 (two) times daily with a meal.    Cholecalciferol (VITAMIN D3) 50 MCG (2000 UT) TABS Take 2,000 Units by mouth at bedtime.    colchicine 0.6 MG tablet Take 1 tablet (0.6 mg total) by mouth 2 (two) times daily.    diltiazem (TIAZAC) 360 MG 24 hr capsule Take 1 capsule (360 mg total) by mouth daily.    doxazosin (CARDURA) 4 MG tablet TAKE 1 TABLET BY MOUTH AT BEDTIME    fluticasone (FLONASE) 50 MCG/ACT nasal spray Place 2 sprays into both nostrils 2 (two) times daily.     furosemide (LASIX) 80 MG tablet Take 1 tablet (80 mg total) by mouth 2 (two) times daily.    glipiZIDE (GLUCOTROL XL) 10 MG 24 hr tablet TAKE ONE TABLET EVERY DAY    insulin degludec (TRESIBA) 200 UNIT/ML FlexTouch Pen 120 Units. Given by endo    ipratropium (ATROVENT) 0.02 % nebulizer solution Take 0.5 mg by nebulization every 6 (six) hours as needed for wheezing or shortness of breath.    levothyroxine (SYNTHROID) 88 MCG tablet Take 1 tablet (88 mcg total) by mouth daily before breakfast.    loratadine (CLARITIN) 10 MG tablet Take 10 mg by mouth daily.  08/24/2018: Takes liqui-gels at home   metolazone (ZAROXOLYN) 5 MG tablet TAKE 1 TABLET BY MOUTH DAILY FOR SWELLING    multivitamin (ONE-A-DAY MEN'S) TABS tablet Take 1 tablet by mouth daily.    nitroGLYCERIN (NITROSTAT) 0.4  MG SL tablet Place 1 tablet (0.4 mg total) under the tongue every 5 (five) minutes as needed for chest pain.    traZODone (DESYREL) 50 MG tablet Take 1 tablet (50 mg total) by mouth at bedtime as needed for sleep.    [DISCONTINUED] febuxostat (ULORIC) 40 MG tablet Take 1 tablet (40 mg total) by mouth daily.    [DISCONTINUED] oxyCODONE (ROXICODONE) 5 MG immediate release tablet Take 1 tablet (5 mg total) by mouth every 4 (four) hours as needed for severe pain.    [DISCONTINUED] potassium chloride (KLOR-CON) 10 MEQ tablet Take 1 tablet (10 mEq total) by mouth daily.    [DISCONTINUED] SYMBICORT  160-4.5 MCG/ACT inhaler TAKE 2 PUFFS BY MOUTH TWICE A DAY    amiodarone (PACERONE) 200 MG tablet Take 1 tablet (200 mg total) by mouth daily for 30 days.    febuxostat (ULORIC) 40 MG tablet Take 1 tablet (40 mg total) by mouth daily.    oxyCODONE (ROXICODONE) 5 MG immediate release tablet Take 1 tablet (5 mg total) by mouth every 4 (four) hours as needed for severe pain.    potassium chloride (KLOR-CON) 10 MEQ tablet Take two tablets every other day and 1 tablet on the days in between.    [DISCONTINUED] pantoprazole (PROTONIX) 40 MG tablet Take 1 tablet (40 mg total) by mouth daily. (Patient not taking: Reported on 04/13/2021)    No facility-administered encounter medications on file as of 04/13/2021.    Surgical History: Past Surgical History:  Procedure Laterality Date   COLONOSCOPY WITH PROPOFOL N/A 11/07/2017   Procedure: COLONOSCOPY WITH PROPOFOL;  Surgeon: [name], MD;  Location: [facility] ENDOSCOPY;  Service: Gastroenterology;  Laterality: N/A;   ESOPHAGOGASTRODUODENOSCOPY (EGD) WITH PROPOFOL N/A 11/07/2017   Procedure: ESOPHAGOGASTRODUODENOSCOPY (EGD) WITH PROPOFOL;  Surgeon: [name], MD;  Location: [facility] ENDOSCOPY;  Service: Gastroenterology;  Laterality: N/A;   LEFT HEART CATH AND CORONARY ANGIOGRAPHY N/A 10/23/2017   Procedure: LEFT HEART CATH AND CORONARY ANGIOGRAPHY;  Surgeon: Isaias Cowman, MD;  Location: [facility];  Service: Cardiovascular;  Laterality: N/A;    Medical History: Past Medical History:  Diagnosis Date   CHF (congestive heart failure) (HCC)    COPD (chronic obstructive pulmonary disease) (HCC)    Diabetes mellitus without complication (HCC)    Edema    leg swelling   Gastroesophageal reflux disease 12/15/2017   Gout    Hypertension    Melena 11/05/2017   Pneumonia 07/27/2016    Family History: Family History  Problem Relation Age of Onset   Cancer Mother    CVA Father    Heart disease Father     Social History   Socioeconomic  History   Marital status: Single    Spouse name: Not on file   Number of children: Not on file   Years of education: Not on file   Highest education level: Not on file  Occupational History   Not on file  Tobacco Use   Smoking status: Never   Smokeless tobacco: Never  Substance and Sexual Activity   Alcohol use: Not Currently    Comment: ocassionally   Drug use: Never   Sexual activity: Not on file  Other Topics Concern   Not on file  Social History Narrative   Not on file   Social Determinants of Health   Financial Resource Strain: Not on file  Food Insecurity: Not on file  Transportation Needs: Not on file  Physical Activity: Not on file  Stress: Not on file  Social Connections: Not on file  Intimate Partner Violence: Not on file      Review of Systems  Constitutional:  Negative for chills, fatigue and unexpected weight change.  HENT:  Negative for congestion, rhinorrhea, sneezing and sore throat.   Eyes:  Negative for redness.  Respiratory:  Negative for cough, chest tightness and shortness of breath.   Cardiovascular:  Negative for chest pain and palpitations.  Gastrointestinal:  Negative for abdominal pain, constipation, diarrhea, nausea and vomiting.  Genitourinary:  Negative for dysuria and frequency.  Musculoskeletal:  Negative for arthralgias, back pain, joint swelling and neck pain.  Skin:  Negative for rash.  Neurological: Negative.  Negative for tremors and numbness.  Hematological:  Negative for adenopathy. Does not bruise/bleed easily.  Psychiatric/Behavioral:  Negative for behavioral problems (Depression), sleep disturbance and suicidal ideas. The patient is not nervous/anxious.    Vital Signs: BP 138/72   Pulse 60   Temp (!) 97.1 F (36.2 C)   Resp 16   Ht 5\' 9"  (1.753 m)   Wt (!) 319 lb 6.4 oz (144.9 kg)   SpO2 98%   BMI 47.17 kg/m    Physical Exam Vitals reviewed.  Constitutional:      General: He is not in acute distress.     Appearance: Normal appearance. He is obese. He is not ill-appearing.  HENT:     Head: Normocephalic and atraumatic.  Eyes:     Extraocular Movements: Extraocular movements intact.     Pupils: Pupils are equal, round, and reactive to light.  Cardiovascular:     Rate and Rhythm: Normal rate and regular rhythm.  Pulmonary:     Effort: Pulmonary effort is normal. No respiratory distress.  Neurological:     Mental Status: He is alert and oriented to person, place, and time.     Cranial Nerves: No cranial nerve deficit.     Coordination: Coordination normal.     Gait: Gait normal.  Psychiatric:        Mood and Affect: Mood normal.        Behavior: Behavior normal.    Assessment/Plan: 1. Uncontrolled type 2 diabetes mellitus with hyperglycemia (HCC) A1C was 9.1, his diabetes is managed by Dr. Honor Junes, endocrinology. He is scheduled to see him in October.  - POCT HgB A1C  2. Essential hypertension Stable with current medications.   3. Chronic gout of right wrist due to renal impairment without tophus [place] Controlled Substance Database was reviewed by me for overdose risk score (ORS). ORS is 310. Prescriptions were refilled, he takes oxycodone as needed when the pain is severe.  - febuxostat (ULORIC) 40 MG tablet; Take 1 tablet (40 mg total) by mouth daily.  Dispense: 30 tablet; Refill: 2 - oxyCODONE (ROXICODONE) 5 MG immediate release tablet; Take 1 tablet (5 mg total) by mouth every 4 (four) hours as needed for severe pain.  Dispense: 30 tablet; Refill: 0  4. Chronic obstructive pulmonary disease, unspecified COPD type ([place]) He takes symbicort but it is too expensive, trial breztri, samples provided and prescription sent, follow up in 4 weeks.  - Budeson-Glycopyrrol-Formoterol (BREZTRI AEROSPHERE) 160-9-4.8 MCG/ACT AERO; Inhale 2 puffs into the lungs 2 (two) times daily.  Dispense: 10.7 g; Refill: 11  5. Hypokalemia Patient is on diuretics and has CKD, takes a potassium supplement.  -  potassium chloride (KLOR-CON) 10 MEQ tablet; Take two tablets every other day and 1 tablet on the days in between.  Dispense: 180 tablet; Refill: 0  6. Psychophysiological  insomnia Having difficulty sleeping, trazodone prescribed. Follow up in 4 weeks.  - traZODone (DESYREL) 50 MG tablet; Take 1 tablet (50 mg total) by mouth at bedtime as needed for sleep.  Dispense: 30 tablet; Refill: 0  7. Encounter for long-term (current) use of high-risk medication UDS done. It was negative. He takes the medication as needed.  - POCT Urine Drug Screen  8. Morbid obesity with body mass index (BMI) of 45.0 to 49.9 in adult [facility]) He has lost 8 lbs since his last office visit in June. Discussed diet modifications to help with weight loss as well as diabetes. Discussed physical activity and walking a few times a week or daily if tolerated.    General Counseling: Malva Cogan understanding of the findings of todays visit and agrees with plan of treatment. I have discussed any further diagnostic evaluation that may be needed or ordered today. We also reviewed his medications today. he has been encouraged to call the office with any questions or concerns that should arise related to todays visit.    Orders Placed This Encounter  Procedures   POCT HgB A1C   POCT Urine Drug Screen    Meds ordered this encounter  Medications   febuxostat (ULORIC) 40 MG tablet    Sig: Take 1 tablet (40 mg total) by mouth daily.    Dispense:  30 tablet    Refill:  2   Budeson-Glycopyrrol-Formoterol (BREZTRI AEROSPHERE) 160-9-4.8 MCG/ACT AERO    Sig: Inhale 2 puffs into the lungs 2 (two) times daily.    Dispense:  10.7 g    Refill:  11   potassium chloride (KLOR-CON) 10 MEQ tablet    Sig: Take two tablets every other day and 1 tablet on the days in between.    Dispense:  180 tablet    Refill:  0    Pt need appt for refills   oxyCODONE (ROXICODONE) 5 MG immediate release tablet    Sig: Take 1 tablet (5 mg total) by  mouth every 4 (four) hours as needed for severe pain.    Dispense:  30 tablet    Refill:  0   traZODone (DESYREL) 50 MG tablet    Sig: Take 1 tablet (50 mg total) by mouth at bedtime as needed for sleep.    Dispense:  30 tablet    Refill:  0    Return in about 1 month (around 05/14/2021) for F/U, Ellinore Merced PCP new inhaler and new sleep med.   Total time spent:30 Minutes Time spent includes review of chart, medications, test results, and follow up plan with the patient.   [place] Controlled Substance Database was reviewed by me.  This patient was seen by Jonetta Osgood, FNP-C in collaboration with Dr. Clayborn Bigness as a part of collaborative care agreement.   Syrianna Schillaci R. Valetta Fuller, MSN, FNP-C Internal medicine

## 2021-05-07 ENCOUNTER — Other Ambulatory Visit: Payer: Self-pay | Admitting: Nurse Practitioner

## 2021-05-07 DIAGNOSIS — F5104 Psychophysiologic insomnia: Secondary | ICD-10-CM

## 2021-05-14 DIAGNOSIS — G4733 Obstructive sleep apnea (adult) (pediatric): Secondary | ICD-10-CM | POA: Diagnosis not present

## 2021-05-17 ENCOUNTER — Emergency Department: Payer: HMO

## 2021-05-17 ENCOUNTER — Other Ambulatory Visit: Payer: Self-pay

## 2021-05-17 ENCOUNTER — Emergency Department
Admission: EM | Admit: 2021-05-17 | Discharge: 2021-05-17 | Disposition: A | Discharge status: Home / Self Care | Payer: HMO | Attending: Emergency Medicine | Admitting: Emergency Medicine

## 2021-05-17 ENCOUNTER — Encounter: Payer: Self-pay | Admitting: Emergency Medicine

## 2021-05-17 DIAGNOSIS — I2510 Atherosclerotic heart disease of native coronary artery without angina pectoris: Secondary | ICD-10-CM | POA: Diagnosis not present

## 2021-05-17 DIAGNOSIS — G4733 Obstructive sleep apnea (adult) (pediatric): Secondary | ICD-10-CM | POA: Diagnosis not present

## 2021-05-17 DIAGNOSIS — R2243 Localized swelling, mass and lump, lower limb, bilateral: Secondary | ICD-10-CM | POA: Diagnosis not present

## 2021-05-17 DIAGNOSIS — E119 Type 2 diabetes mellitus without complications: Secondary | ICD-10-CM | POA: Diagnosis not present

## 2021-05-17 DIAGNOSIS — I5023 Acute on chronic systolic (congestive) heart failure: Secondary | ICD-10-CM | POA: Diagnosis not present

## 2021-05-17 DIAGNOSIS — E7800 Pure hypercholesterolemia, unspecified: Secondary | ICD-10-CM | POA: Diagnosis not present

## 2021-05-17 DIAGNOSIS — Z5321 Procedure and treatment not carried out due to patient leaving prior to being seen by health care provider: Secondary | ICD-10-CM | POA: Diagnosis not present

## 2021-05-17 DIAGNOSIS — J449 Chronic obstructive pulmonary disease, unspecified: Secondary | ICD-10-CM | POA: Diagnosis not present

## 2021-05-17 DIAGNOSIS — R0602 Shortness of breath: Secondary | ICD-10-CM | POA: Diagnosis not present

## 2021-05-17 DIAGNOSIS — N184 Chronic kidney disease, stage 4 (severe): Secondary | ICD-10-CM | POA: Diagnosis not present

## 2021-05-17 DIAGNOSIS — I480 Paroxysmal atrial fibrillation: Secondary | ICD-10-CM | POA: Diagnosis not present

## 2021-05-17 DIAGNOSIS — I10 Essential (primary) hypertension: Secondary | ICD-10-CM | POA: Diagnosis not present

## 2021-05-17 DIAGNOSIS — R600 Localized edema: Secondary | ICD-10-CM | POA: Diagnosis not present

## 2021-05-17 DIAGNOSIS — E8779 Other fluid overload: Secondary | ICD-10-CM | POA: Diagnosis not present

## 2021-05-17 LAB — CBC
HCT: 38 % — ABNORMAL LOW (ref 39.0–52.0)
Hemoglobin: 12.4 g/dL — ABNORMAL LOW (ref 13.0–17.0)
MCH: 30 pg (ref 26.0–34.0)
MCHC: 32.6 g/dL (ref 30.0–36.0)
MCV: 92 fL (ref 80.0–100.0)
Platelets: 237 10*3/uL (ref 150–400)
RBC: 4.13 MIL/uL — ABNORMAL LOW (ref 4.22–5.81)
RDW: 14.7 % (ref 11.5–15.5)
WBC: 12.1 10*3/uL — ABNORMAL HIGH (ref 4.0–10.5)
nRBC: 0 % (ref 0.0–0.2)

## 2021-05-17 LAB — BASIC METABOLIC PANEL
Anion gap: 12 (ref 5–15)
BUN: 27 mg/dL — ABNORMAL HIGH (ref 8–23)
CO2: 30 mmol/L (ref 22–32)
Calcium: 8.9 mg/dL (ref 8.9–10.3)
Chloride: 95 mmol/L — ABNORMAL LOW (ref 98–111)
Creatinine, Ser: 2.19 mg/dL — ABNORMAL HIGH (ref 0.61–1.24)
GFR, Estimated: 32 mL/min — ABNORMAL LOW (ref >60)
Glucose, Bld: 137 mg/dL — ABNORMAL HIGH (ref 70–99)
Potassium: 3.6 mmol/L (ref 3.5–5.1)
Sodium: 137 mmol/L (ref 135–145)

## 2021-05-17 LAB — BRAIN NATRIURETIC PEPTIDE: B Natriuretic Peptide: 51.3 pg/mL (ref 0.0–100.0)

## 2021-05-17 NOTE — ED Triage Notes (Signed)
C/O bilateral lower extremity swelling, DOE "for a while' sent to eD by Cardiology

## 2021-05-18 ENCOUNTER — Ambulatory Visit: Payer: HMO | Admitting: Nurse Practitioner

## 2021-05-18 ENCOUNTER — Encounter: Payer: Self-pay | Admitting: Nurse Practitioner

## 2021-05-18 VITALS — BP 122/70 | HR 64 | Temp 97.8°F | Resp 16 | Ht 67.0 in | Wt 330.4 lb

## 2021-05-18 DIAGNOSIS — J449 Chronic obstructive pulmonary disease, unspecified: Secondary | ICD-10-CM | POA: Diagnosis not present

## 2021-05-18 DIAGNOSIS — L03115 Cellulitis of right lower limb: Secondary | ICD-10-CM | POA: Diagnosis not present

## 2021-05-18 DIAGNOSIS — K259 Gastric ulcer, unspecified as acute or chronic, without hemorrhage or perforation: Secondary | ICD-10-CM | POA: Insufficient documentation

## 2021-05-18 DIAGNOSIS — I5032 Chronic diastolic (congestive) heart failure: Secondary | ICD-10-CM | POA: Diagnosis not present

## 2021-05-18 DIAGNOSIS — L03116 Cellulitis of left lower limb: Secondary | ICD-10-CM | POA: Diagnosis not present

## 2021-05-18 NOTE — Progress Notes (Signed)
[facility] [place], [place] [address]  Internal MEDICINE  Office Visit Note  Patient Name: Jason Hudson  [id number]  [id number]  Date of Service: 05/18/2021  Chief Complaint  Patient presents with   Diabetes   ER visit    Heart Dr sent pt to hospital yesterday and pt waited for 7 hours and never was seen and pt left.  Per pt Heart Dr is to send in antibiotic and fluid medication for leg redness    HPI Jovin went to an office visit with his cardiologist yesterday and was sent to the ER from his office due to severe cellulitis of bilateral lower extremities. He waited for several hours approximately 7 hours and left so he was not see by an ER provider. He did have an EKG, chest xray and labs done. His chest xray was normal. His EKG showed sinus rhythm with a 1st degree AV block. According to his labs, he is slightly anemic. Since he did not stay in the ER, his cardiologist prescribed keflex and doxycycline which the patient has not picked up from the pharmacy.  His cardiologist started him on torsemide. He continues to use breztri inhaler which is working well. He has an appointment with nephrology tomorrow and will see his cardiologist again in 2 weeks.      Current Medication: Outpatient Encounter Medications as of 05/18/2021  Medication Sig Note   albuterol (VENTOLIN HFA) 108 (90 Base) MCG/ACT inhaler Inhale 1-2 puffs into the lungs every 6 (six) hours as needed for wheezing or shortness of breath.    aspirin EC 81 MG tablet Take 81 mg by mouth daily. Swallow whole.    Budeson-Glycopyrrol-Formoterol (BREZTRI AEROSPHERE) 160-9-4.8 MCG/ACT AERO Inhale 2 puffs into the lungs 2 (two) times daily.    carvedilol (COREG) 6.25 MG tablet Take 6.25 mg by mouth 2 (two) times daily with a meal. Pt gets from cardiologist    Cholecalciferol (VITAMIN D3) 50 MCG (2000 UT) TABS Take 2,000 Units by mouth at bedtime.    colchicine 0.6 MG tablet Take 1 tablet (0.6 mg  total) by mouth 2 (two) times daily.    diltiazem (TIAZAC) 360 MG 24 hr capsule Take 1 capsule (360 mg total) by mouth daily.    doxazosin (CARDURA) 4 MG tablet TAKE 1 TABLET BY MOUTH AT BEDTIME    febuxostat (ULORIC) 40 MG tablet Take 1 tablet (40 mg total) by mouth daily.    fluticasone (FLONASE) 50 MCG/ACT nasal spray Place 2 sprays into both nostrils 2 (two) times daily.     glipiZIDE (GLUCOTROL XL) 10 MG 24 hr tablet TAKE ONE TABLET EVERY DAY    insulin degludec (TRESIBA) 200 UNIT/ML FlexTouch Pen 120 Units. Given by endo    ipratropium (ATROVENT) 0.02 % nebulizer solution Take 0.5 mg by nebulization every 6 (six) hours as needed for wheezing or shortness of breath.    levothyroxine (SYNTHROID) 88 MCG tablet Take 1 tablet (88 mcg total) by mouth daily before breakfast.    loratadine (CLARITIN) 10 MG tablet Take 10 mg by mouth daily.  08/24/2018: Takes liqui-gels at home   metolazone (ZAROXOLYN) 5 MG tablet TAKE 1 TABLET BY MOUTH DAILY FOR SWELLING    multivitamin (ONE-A-DAY MEN'S) TABS tablet Take 1 tablet by mouth daily.    nitroGLYCERIN (NITROSTAT) 0.4 MG SL tablet Place 1 tablet (0.4 mg total) under the tongue every 5 (five) minutes as needed for chest pain.    potassium chloride (KLOR-CON) 10 MEQ tablet  Take two tablets every other day and 1 tablet on the days in between.    traZODone (DESYREL) 50 MG tablet TAKE ONE TABLET AT BEDTIME AS NEEDED FORSLEEP    [DISCONTINUED] furosemide (LASIX) 80 MG tablet Take 1 tablet (80 mg total) by mouth 2 (two) times daily.    [DISCONTINUED] oxyCODONE (ROXICODONE) 5 MG immediate release tablet Take 1 tablet (5 mg total) by mouth every 4 (four) hours as needed for severe pain.    amiodarone (PACERONE) 200 MG tablet Take 1 tablet (200 mg total) by mouth daily for 30 days.    No facility-administered encounter medications on file as of 05/18/2021.    Surgical History: Past Surgical History:  Procedure Laterality Date   COLONOSCOPY WITH PROPOFOL N/A  11/07/2017   Procedure: COLONOSCOPY WITH PROPOFOL;  Surgeon: [name], MD;  Location: [facility] ENDOSCOPY;  Service: Gastroenterology;  Laterality: N/A;   ESOPHAGOGASTRODUODENOSCOPY (EGD) WITH PROPOFOL N/A 11/07/2017   Procedure: ESOPHAGOGASTRODUODENOSCOPY (EGD) WITH PROPOFOL;  Surgeon: [name], MD;  Location: [facility] ENDOSCOPY;  Service: Gastroenterology;  Laterality: N/A;   LEFT HEART CATH AND CORONARY ANGIOGRAPHY N/A 10/23/2017   Procedure: LEFT HEART CATH AND CORONARY ANGIOGRAPHY;  Surgeon: Isaias Cowman, MD;  Location: [facility];  Service: Cardiovascular;  Laterality: N/A;    Medical History: Past Medical History:  Diagnosis Date   CHF (congestive heart failure) (HCC)    COPD (chronic obstructive pulmonary disease) (HCC)    Diabetes mellitus without complication (HCC)    Edema    leg swelling   Gastroesophageal reflux disease 12/15/2017   Gout    Hypertension    Melena 11/05/2017   Pneumonia 07/27/2016    Family History: Family History  Problem Relation Age of Onset   Cancer Mother    CVA Father    Heart disease Father     Social History   Socioeconomic History   Marital status: Single    Spouse name: Not on file   Number of children: Not on file   Years of education: Not on file   Highest education level: Not on file  Occupational History   Not on file  Tobacco Use   Smoking status: Never   Smokeless tobacco: Never  Substance and Sexual Activity   Alcohol use: Not Currently    Comment: ocassionally   Drug use: Never   Sexual activity: Not on file  Other Topics Concern   Not on file  Social History Narrative   Not on file   Social Determinants of Health   Financial Resource Strain: Not on file  Food Insecurity: Not on file  Transportation Needs: Not on file  Physical Activity: Not on file  Stress: Not on file  Social Connections: Not on file  Intimate Partner Violence: Not on file      Review of Systems  Constitutional:  Negative for  chills, fatigue and unexpected weight change.  HENT:  Negative for congestion, rhinorrhea, sneezing and sore throat.   Eyes:  Negative for redness.  Respiratory: Negative.  Negative for cough, chest tightness and shortness of breath.   Cardiovascular: Negative.  Negative for chest pain and palpitations.  Gastrointestinal: Negative.  Negative for abdominal pain, constipation, diarrhea, nausea and vomiting.  Genitourinary:  Negative for dysuria and frequency.  Musculoskeletal:  Negative for arthralgias, back pain, joint swelling and neck pain.  Skin:  Positive for color change and rash.       Significant redness and edema noted bilaterally.   Neurological: Negative.  Negative for  tremors and numbness.  Hematological:  Negative for adenopathy. Does not bruise/bleed easily.  Psychiatric/Behavioral:  Negative for behavioral problems (Depression), sleep disturbance and suicidal ideas. The patient is not nervous/anxious.    Vital Signs: BP 122/70   Pulse 64   Temp 97.8 F (36.6 C)   Resp 16   Ht 5\' 7"  (1.702 m)   Wt (!) 330 lb 6.4 oz (149.9 kg)   SpO2 93%   BMI 51.75 kg/m    Physical Exam Constitutional:      General: He is not in acute distress.    Appearance: Normal appearance. He is obese. He is not ill-appearing.  HENT:     Head: Normocephalic and atraumatic.  Eyes:     Extraocular Movements: Extraocular movements intact.     Pupils: Pupils are equal, round, and reactive to light.  Cardiovascular:     Rate and Rhythm: Normal rate and regular rhythm.  Pulmonary:     Effort: Pulmonary effort is normal. No respiratory distress.  Musculoskeletal:     Right lower leg: 2+ Pitting Edema present.     Left lower leg: 2+ Pitting Edema present.  Skin:    General: Skin is warm and dry.     Capillary Refill: Capillary refill takes less than 2 seconds.     Findings: Erythema (and significant swelling of lower extremities) present.  Neurological:     Mental Status: He is alert and  oriented to person, place, and time.     Cranial Nerves: No cranial nerve deficit.     Coordination: Coordination normal.     Gait: Gait normal.  Psychiatric:        Mood and Affect: Mood normal.        Behavior: Behavior normal.       Assessment/Plan: 1. Cellulitis of right lower extremity Was previously prescribed keflex and doxycycline, still needs to pick up from pharmacy  2. Cellulitis of left lower extremity See problem #1   3. Chronic obstructive pulmonary disease, unspecified COPD type ([place]) Continue MDI as before   4. Chronic diastolic congestive heart failure ([place]) Followed by cardiology    General Counseling: Malva Cogan understanding of the findings of todays visit and agrees with plan of treatment. I have discussed any further diagnostic evaluation that may be needed or ordered today. We also reviewed his medications today. he has been encouraged to call the office with any questions or concerns that should arise related to todays visit.    No orders of the defined types were placed in this encounter.   No orders of the defined types were placed in this encounter.   Return in about 3 weeks (around 06/08/2021) for F/U cellulitis, Lekeith Wulf PCP.   Total time spent:20 Minutes Time spent includes review of chart, medications, test results, and follow up plan with the patient.   [place] Controlled Substance Database was reviewed by me.  This patient was seen by Jonetta Osgood, FNP-C in collaboration with Dr. Clayborn Bigness as a part of collaborative care agreement.   Codee Bloodworth R. Valetta Fuller, MSN, FNP-C Internal medicine

## 2021-05-19 DIAGNOSIS — N2581 Secondary hyperparathyroidism of renal origin: Secondary | ICD-10-CM | POA: Diagnosis not present

## 2021-05-19 DIAGNOSIS — E1122 Type 2 diabetes mellitus with diabetic chronic kidney disease: Secondary | ICD-10-CM | POA: Diagnosis not present

## 2021-05-19 DIAGNOSIS — I129 Hypertensive chronic kidney disease with stage 1 through stage 4 chronic kidney disease, or unspecified chronic kidney disease: Secondary | ICD-10-CM | POA: Diagnosis not present

## 2021-05-19 DIAGNOSIS — I509 Heart failure, unspecified: Secondary | ICD-10-CM | POA: Diagnosis not present

## 2021-05-19 DIAGNOSIS — R809 Proteinuria, unspecified: Secondary | ICD-10-CM | POA: Diagnosis not present

## 2021-05-19 DIAGNOSIS — N184 Chronic kidney disease, stage 4 (severe): Secondary | ICD-10-CM | POA: Diagnosis not present

## 2021-05-19 DIAGNOSIS — R600 Localized edema: Secondary | ICD-10-CM | POA: Diagnosis not present

## 2021-05-19 DIAGNOSIS — N189 Chronic kidney disease, unspecified: Secondary | ICD-10-CM | POA: Diagnosis not present

## 2021-05-24 ENCOUNTER — Other Ambulatory Visit: Payer: Self-pay

## 2021-05-24 DIAGNOSIS — I152 Hypertension secondary to endocrine disorders: Secondary | ICD-10-CM

## 2021-05-24 DIAGNOSIS — E1159 Type 2 diabetes mellitus with other circulatory complications: Secondary | ICD-10-CM

## 2021-05-26 ENCOUNTER — Other Ambulatory Visit: Payer: Self-pay | Admitting: Nurse Practitioner

## 2021-05-26 DIAGNOSIS — I152 Hypertension secondary to endocrine disorders: Secondary | ICD-10-CM

## 2021-05-26 DIAGNOSIS — M1A3310 Chronic gout due to renal impairment, right wrist, without tophus (tophi): Secondary | ICD-10-CM

## 2021-05-27 NOTE — Telephone Encounter (Signed)
Last appt 9/27 please review

## 2021-06-01 ENCOUNTER — Telehealth: Payer: Self-pay

## 2021-06-01 DIAGNOSIS — N184 Chronic kidney disease, stage 4 (severe): Secondary | ICD-10-CM | POA: Diagnosis not present

## 2021-06-01 DIAGNOSIS — I5022 Chronic systolic (congestive) heart failure: Secondary | ICD-10-CM | POA: Diagnosis not present

## 2021-06-01 DIAGNOSIS — I129 Hypertensive chronic kidney disease with stage 1 through stage 4 chronic kidney disease, or unspecified chronic kidney disease: Secondary | ICD-10-CM | POA: Diagnosis not present

## 2021-06-01 DIAGNOSIS — M5416 Radiculopathy, lumbar region: Secondary | ICD-10-CM | POA: Diagnosis not present

## 2021-06-01 DIAGNOSIS — R600 Localized edema: Secondary | ICD-10-CM | POA: Diagnosis not present

## 2021-06-01 DIAGNOSIS — K219 Gastro-esophageal reflux disease without esophagitis: Secondary | ICD-10-CM | POA: Diagnosis not present

## 2021-06-01 DIAGNOSIS — Z794 Long term (current) use of insulin: Secondary | ICD-10-CM | POA: Diagnosis not present

## 2021-06-01 DIAGNOSIS — E1122 Type 2 diabetes mellitus with diabetic chronic kidney disease: Secondary | ICD-10-CM | POA: Diagnosis not present

## 2021-06-05 ENCOUNTER — Other Ambulatory Visit: Payer: Self-pay | Admitting: Internal Medicine

## 2021-06-05 ENCOUNTER — Other Ambulatory Visit: Payer: Self-pay | Admitting: Nurse Practitioner

## 2021-06-05 DIAGNOSIS — K219 Gastro-esophageal reflux disease without esophagitis: Secondary | ICD-10-CM

## 2021-06-05 DIAGNOSIS — F5104 Psychophysiologic insomnia: Secondary | ICD-10-CM

## 2021-06-05 DIAGNOSIS — E876 Hypokalemia: Secondary | ICD-10-CM

## 2021-06-07 NOTE — Telephone Encounter (Signed)
error 

## 2021-06-08 ENCOUNTER — Encounter: Payer: Self-pay | Admitting: Nurse Practitioner

## 2021-06-08 ENCOUNTER — Other Ambulatory Visit: Payer: Self-pay

## 2021-06-08 ENCOUNTER — Ambulatory Visit (INDEPENDENT_AMBULATORY_CARE_PROVIDER_SITE_OTHER): Payer: HMO | Admitting: Nurse Practitioner

## 2021-06-08 VITALS — BP 120/70 | HR 70 | Temp 98.1°F | Resp 16 | Ht 67.0 in | Wt 321.8 lb

## 2021-06-08 DIAGNOSIS — L03115 Cellulitis of right lower limb: Secondary | ICD-10-CM

## 2021-06-08 DIAGNOSIS — E039 Hypothyroidism, unspecified: Secondary | ICD-10-CM | POA: Diagnosis not present

## 2021-06-08 DIAGNOSIS — J449 Chronic obstructive pulmonary disease, unspecified: Secondary | ICD-10-CM | POA: Diagnosis not present

## 2021-06-08 DIAGNOSIS — Z23 Encounter for immunization: Secondary | ICD-10-CM | POA: Diagnosis not present

## 2021-06-08 DIAGNOSIS — M1A3310 Chronic gout due to renal impairment, right wrist, without tophus (tophi): Secondary | ICD-10-CM | POA: Diagnosis not present

## 2021-06-08 DIAGNOSIS — L03116 Cellulitis of left lower limb: Secondary | ICD-10-CM | POA: Diagnosis not present

## 2021-06-08 MED ORDER — OXYCODONE HCL 5 MG PO TABS: 5.0000 mg | ORAL_TABLET | Freq: Four times a day (QID) | ORAL | 0 refills | Status: DC | PRN

## 2021-06-08 MED ORDER — ZOSTER VAC RECOMB ADJUVANTED 50 MCG/0.5ML IM SUSR: 0.5000 mL | Freq: Once | INTRAMUSCULAR | 0 refills | Status: AC

## 2021-06-08 MED ORDER — LEVOTHYROXINE SODIUM 88 MCG PO TABS: 88.0000 ug | ORAL_TABLET | Freq: Every day | ORAL | 0 refills | Status: DC

## 2021-06-08 MED ORDER — BENZONATATE 100 MG PO CAPS: 100.0000 mg | ORAL_CAPSULE | Freq: Two times a day (BID) | ORAL | 0 refills | Status: DC | PRN

## 2021-06-08 NOTE — Progress Notes (Signed)
[facility], [place] [address]  Internal MEDICINE  Office Visit Note  Patient Name: Jason Hudson  [id number]  [id number]  Date of Service: 06/08/2021  Chief Complaint  Patient presents with   Follow-up    Refills, cellulitis both legs    HPI Zakiah presents for a follow up visit for cellulitis and medication refills. He was last seen on 05/18/21 for acute cellulitis of bilateral lower extremities after being seen by his cardiologist who ordered antibiotics for him. He was able to get antibiotics and states that the swelling has improved. He completed the course of antibiotics and is feeling much better.     Current Medication: Outpatient Encounter Medications as of 06/08/2021  Medication Sig Note   albuterol (VENTOLIN HFA) 108 (90 Base) MCG/ACT inhaler Inhale 1-2 puffs into the lungs every 6 (six) hours as needed for wheezing or shortness of breath.    aspirin EC 81 MG tablet Take 81 mg by mouth daily. Swallow whole.    benzonatate (TESSALON) 100 MG capsule Take 1 capsule (100 mg total) by mouth 2 (two) times daily as needed for cough.    Budeson-Glycopyrrol-Formoterol (BREZTRI AEROSPHERE) 160-9-4.8 MCG/ACT AERO Inhale 2 puffs into the lungs 2 (two) times daily.    carvedilol (COREG) 6.25 MG tablet Take 6.25 mg by mouth 2 (two) times daily with a meal. Pt gets from cardiologist    Cholecalciferol (VITAMIN D3) 50 MCG (2000 UT) TABS Take 2,000 Units by mouth at bedtime.    colchicine 0.6 MG tablet Take 1 tablet (0.6 mg total) by mouth 2 (two) times daily.    diltiazem (TIAZAC) 360 MG 24 hr capsule Take 1 capsule (360 mg total) by mouth daily.    doxazosin (CARDURA) 4 MG tablet TAKE 1 TABLET BY MOUTH AT BEDTIME    febuxostat (ULORIC) 40 MG tablet Take 1 tablet (40 mg total) by mouth daily.    fluticasone (FLONASE) 50 MCG/ACT nasal spray Place 2 sprays into both nostrils 2 (two) times daily.     glipiZIDE (GLUCOTROL XL) 10 MG 24 hr tablet TAKE  ONE TABLET EVERY DAY    insulin degludec (TRESIBA) 200 UNIT/ML FlexTouch Pen 120 Units. Given by endo    ipratropium (ATROVENT) 0.02 % nebulizer solution Take 0.5 mg by nebulization every 6 (six) hours as needed for wheezing or shortness of breath.    loratadine (CLARITIN) 10 MG tablet Take 10 mg by mouth daily.  08/24/2018: Takes liqui-gels at home   metolazone (ZAROXOLYN) 5 MG tablet TAKE 1 TABLET BY MOUTH DAILY FOR SWELLING    multivitamin (ONE-A-DAY MEN'S) TABS tablet Take 1 tablet by mouth daily.    nitroGLYCERIN (NITROSTAT) 0.4 MG SL tablet Place 1 tablet (0.4 mg total) under the tongue every 5 (five) minutes as needed for chest pain.    potassium chloride (KLOR-CON) 10 MEQ tablet TAKE TWO TABLETS EVERY OTHER DAY AND 1 TABLET ON THE DAYS IN BETWEEN    traZODone (DESYREL) 50 MG tablet TAKE ONE TABLET AT BEDTIME AS NEEDED FORSLEEP    [DISCONTINUED] levothyroxine (SYNTHROID) 88 MCG tablet Take 1 tablet (88 mcg total) by mouth daily before breakfast.    [DISCONTINUED] oxyCODONE (OXY IR/ROXICODONE) 5 MG immediate release tablet TAKE ONE TABLET EVERY 4 HOURS AS NEEDED FOR SEVERE PAIN    [DISCONTINUED] Zoster Vaccine Adjuvanted (SHINGRIX) injection Inject 0.5 mLs into the muscle once.    amiodarone (PACERONE) 200 MG tablet Take 1 tablet (200 mg total) by mouth daily for 30 days.  levothyroxine (SYNTHROID) 88 MCG tablet Take 1 tablet (88 mcg total) by mouth daily before breakfast.    oxyCODONE (OXY IR/ROXICODONE) 5 MG immediate release tablet Take 1 tablet (5 mg total) by mouth every 6 (six) hours as needed for severe pain.    Zoster Vaccine Adjuvanted [facility]) injection Inject 0.5 mLs into the muscle once for 1 dose.    No facility-administered encounter medications on file as of 06/08/2021.    Surgical History: Past Surgical History:  Procedure Laterality Date   COLONOSCOPY WITH PROPOFOL N/A 11/07/2017   Procedure: COLONOSCOPY WITH PROPOFOL;  Surgeon: [name], MD;  Location: [facility]  ENDOSCOPY;  Service: Gastroenterology;  Laterality: N/A;   ESOPHAGOGASTRODUODENOSCOPY (EGD) WITH PROPOFOL N/A 11/07/2017   Procedure: ESOPHAGOGASTRODUODENOSCOPY (EGD) WITH PROPOFOL;  Surgeon: [name], MD;  Location: [facility] ENDOSCOPY;  Service: Gastroenterology;  Laterality: N/A;   LEFT HEART CATH AND CORONARY ANGIOGRAPHY N/A 10/23/2017   Procedure: LEFT HEART CATH AND CORONARY ANGIOGRAPHY;  Surgeon: Isaias Cowman, MD;  Location: [place] CV LAB;  Service: Cardiovascular;  Laterality: N/A;    Medical History: Past Medical History:  Diagnosis Date   CHF (congestive heart failure) (HCC)    COPD (chronic obstructive pulmonary disease) (HCC)    Diabetes mellitus without complication (HCC)    Edema    leg swelling   Gastroesophageal reflux disease 12/15/2017   Gout    Hypertension    Melena 11/05/2017   Pneumonia 07/27/2016    Family History: Family History  Problem Relation Age of Onset   Cancer Mother    CVA Father    Heart disease Father     Social History   Socioeconomic History   Marital status: Single    Spouse name: Not on file   Number of children: Not on file   Years of education: Not on file   Highest education level: Not on file  Occupational History   Not on file  Tobacco Use   Smoking status: Never   Smokeless tobacco: Never  Substance and Sexual Activity   Alcohol use: Not Currently    Comment: ocassionally   Drug use: Never   Sexual activity: Not on file  Other Topics Concern   Not on file  Social History Narrative   Not on file   Social Determinants of Health   Financial Resource Strain: Not on file  Food Insecurity: Not on file  Transportation Needs: Not on file  Physical Activity: Not on file  Stress: Not on file  Social Connections: Not on file  Intimate Partner Violence: Not on file      Review of Systems  Constitutional: Negative.  Negative for chills, fatigue and unexpected weight change.  HENT: Negative.  Negative for  congestion, rhinorrhea, sneezing and sore throat.   Eyes:  Negative for redness.  Respiratory: Negative.  Negative for cough, chest tightness, shortness of breath and wheezing.   Cardiovascular:  Positive for leg swelling. Negative for chest pain and palpitations.  Gastrointestinal:  Negative for abdominal pain, constipation, diarrhea, nausea and vomiting.  Genitourinary:  Negative for dysuria and frequency.  Musculoskeletal:  Negative for arthralgias, back pain, joint swelling and neck pain.  Skin:  Negative for rash.  Neurological: Negative.  Negative for tremors and numbness.  Hematological:  Negative for adenopathy. Does not bruise/bleed easily.  Psychiatric/Behavioral:  Negative for behavioral problems (Depression), sleep disturbance and suicidal ideas. The patient is not nervous/anxious.    Vital Signs: BP 120/70   Pulse 70   Temp 98.1 F (36.7  C)   Resp 16   Ht 5\' 7"  (1.702 m)   Wt (!) 321 lb 12.8 oz (146 kg)   SpO2 97%   BMI 50.40 kg/m    Physical Exam Vitals reviewed.  Constitutional:      General: He is not in acute distress.    Appearance: Normal appearance. He is obese. He is not ill-appearing.  HENT:     Head: Normocephalic and atraumatic.  Eyes:     Extraocular Movements: Extraocular movements intact.     Pupils: Pupils are equal, round, and reactive to light.  Cardiovascular:     Rate and Rhythm: Normal rate and regular rhythm.  Pulmonary:     Effort: Pulmonary effort is normal. No respiratory distress.  Musculoskeletal:     Right lower leg: Edema (improving) present.     Left lower leg: Edema (improving) present.  Neurological:     Mental Status: He is alert and oriented to person, place, and time.     Cranial Nerves: No cranial nerve deficit.     Coordination: Coordination normal.     Gait: Gait normal.  Psychiatric:        Mood and Affect: Mood normal.        Behavior: Behavior normal.     Assessment/Plan: 1. Cellulitis of left lower  extremity Improving, swelling and redness reduced  2. Cellulitis of right lower extremity Improving, swelling and redness reduced.  3. Chronic obstructive pulmonary disease, unspecified COPD type ([place]) Stable, requested something for cough, benzonatate sent.  - benzonatate (TESSALON) 100 MG capsule; Take 1 capsule (100 mg total) by mouth 2 (two) times daily as needed for cough.  Dispense: 60 capsule; Refill: 0  4. 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  Dispense: 60 tablet; Refill: 0  5. Need for shingles vaccine - Zoster Vaccine Adjuvanted [facility]) injection; Inject 0.5 mLs into the muscle once for 1 dose.  Dispense: 0.5 mL; Refill: 0  6. Acquired hypothyroidism Stable, refill ordered - levothyroxine (SYNTHROID) 88 MCG tablet; Take 1 tablet (88 mcg total) by mouth daily before breakfast.  Dispense: 90 tablet; Refill: 0   General Counseling: Malva Cogan understanding of the findings of todays visit and agrees with plan of treatment. I have discussed any further diagnostic evaluation that may be needed or ordered today. We also reviewed his medications today. he has been encouraged to call the office with any questions or concerns that should arise related to todays visit.    No orders of the defined types were placed in this encounter.   Meds ordered this encounter  Medications   Zoster Vaccine Adjuvanted [facility]) injection    Sig: Inject 0.5 mLs into the muscle once for 1 dose.    Dispense:  0.5 mL    Refill:  0   levothyroxine (SYNTHROID) 88 MCG tablet    Sig: Take 1 tablet (88 mcg total) by mouth daily before breakfast.    Dispense:  90 tablet    Refill:  0   oxyCODONE (OXY IR/ROXICODONE) 5 MG immediate release tablet    Sig: Take 1 tablet (5 mg total) by mouth every 6  (six) hours as needed for severe pain.    Dispense:  60 tablet    Refill:  0   benzonatate (TESSALON) 100 MG capsule  Sig: Take 1 capsule (100 mg total) by mouth 2 (two) times daily as needed for cough.    Dispense:  60 capsule    Refill:  0    Return in 2 months (on 08/18/2021) for previously scheduled, F/U, Ranay Ketter PCP.   Total time spent:30 Minutes Time spent includes review of chart, medications, test results, and follow up plan with the patient.   [place] Controlled Substance Database was reviewed by me.  This patient was seen by Jonetta Osgood, FNP-C in collaboration with Dr. Clayborn Bigness as a part of collaborative care agreement.   Rakim Moone R. Valetta Fuller, MSN, FNP-C Internal medicine

## 2021-06-10 DIAGNOSIS — I2510 Atherosclerotic heart disease of native coronary artery without angina pectoris: Secondary | ICD-10-CM | POA: Diagnosis not present

## 2021-06-10 DIAGNOSIS — I509 Heart failure, unspecified: Secondary | ICD-10-CM | POA: Diagnosis not present

## 2021-06-10 DIAGNOSIS — R600 Localized edema: Secondary | ICD-10-CM | POA: Diagnosis not present

## 2021-06-10 DIAGNOSIS — I5023 Acute on chronic systolic (congestive) heart failure: Secondary | ICD-10-CM | POA: Diagnosis not present

## 2021-06-10 DIAGNOSIS — J449 Chronic obstructive pulmonary disease, unspecified: Secondary | ICD-10-CM | POA: Diagnosis not present

## 2021-06-10 DIAGNOSIS — E7800 Pure hypercholesterolemia, unspecified: Secondary | ICD-10-CM | POA: Diagnosis not present

## 2021-06-10 DIAGNOSIS — G4733 Obstructive sleep apnea (adult) (pediatric): Secondary | ICD-10-CM | POA: Diagnosis not present

## 2021-06-10 DIAGNOSIS — I10 Essential (primary) hypertension: Secondary | ICD-10-CM | POA: Diagnosis not present

## 2021-06-10 DIAGNOSIS — N184 Chronic kidney disease, stage 4 (severe): Secondary | ICD-10-CM | POA: Diagnosis not present

## 2021-06-10 DIAGNOSIS — E119 Type 2 diabetes mellitus without complications: Secondary | ICD-10-CM | POA: Diagnosis not present

## 2021-06-10 DIAGNOSIS — I480 Paroxysmal atrial fibrillation: Secondary | ICD-10-CM | POA: Diagnosis not present

## 2021-06-11 ENCOUNTER — Other Ambulatory Visit: Payer: Self-pay | Admitting: Internal Medicine

## 2021-06-11 DIAGNOSIS — R600 Localized edema: Secondary | ICD-10-CM

## 2021-06-13 DIAGNOSIS — G4733 Obstructive sleep apnea (adult) (pediatric): Secondary | ICD-10-CM | POA: Diagnosis not present

## 2021-06-14 DIAGNOSIS — Z125 Encounter for screening for malignant neoplasm of prostate: Secondary | ICD-10-CM | POA: Diagnosis not present

## 2021-06-14 DIAGNOSIS — Z1159 Encounter for screening for other viral diseases: Secondary | ICD-10-CM | POA: Diagnosis not present

## 2021-06-14 DIAGNOSIS — I129 Hypertensive chronic kidney disease with stage 1 through stage 4 chronic kidney disease, or unspecified chronic kidney disease: Secondary | ICD-10-CM | POA: Diagnosis not present

## 2021-06-14 DIAGNOSIS — Z794 Long term (current) use of insulin: Secondary | ICD-10-CM | POA: Diagnosis not present

## 2021-06-14 DIAGNOSIS — E1122 Type 2 diabetes mellitus with diabetic chronic kidney disease: Secondary | ICD-10-CM | POA: Diagnosis not present

## 2021-06-14 DIAGNOSIS — M5416 Radiculopathy, lumbar region: Secondary | ICD-10-CM | POA: Diagnosis not present

## 2021-06-14 DIAGNOSIS — K219 Gastro-esophageal reflux disease without esophagitis: Secondary | ICD-10-CM | POA: Diagnosis not present

## 2021-06-14 DIAGNOSIS — I5022 Chronic systolic (congestive) heart failure: Secondary | ICD-10-CM | POA: Diagnosis not present

## 2021-06-14 DIAGNOSIS — N184 Chronic kidney disease, stage 4 (severe): Secondary | ICD-10-CM | POA: Diagnosis not present

## 2021-06-17 ENCOUNTER — Ambulatory Visit: Payer: HMO

## 2021-06-22 ENCOUNTER — Other Ambulatory Visit: Payer: Self-pay

## 2021-06-22 ENCOUNTER — Ambulatory Visit
Admission: RE | Admit: 2021-06-22 | Discharge: 2021-06-22 | Disposition: A | Discharge status: Home / Self Care | Payer: HMO | Source: Ambulatory Visit | Attending: Internal Medicine | Admitting: Internal Medicine

## 2021-06-22 DIAGNOSIS — R600 Localized edema: Secondary | ICD-10-CM | POA: Insufficient documentation

## 2021-06-25 ENCOUNTER — Telehealth: Payer: Self-pay

## 2021-06-25 NOTE — Telephone Encounter (Signed)
Oxygen therapy order signed by provider and placed in [facility] folder. 

## 2021-06-30 DIAGNOSIS — Z794 Long term (current) use of insulin: Secondary | ICD-10-CM | POA: Diagnosis not present

## 2021-06-30 DIAGNOSIS — M5416 Radiculopathy, lumbar region: Secondary | ICD-10-CM | POA: Diagnosis not present

## 2021-06-30 DIAGNOSIS — I129 Hypertensive chronic kidney disease with stage 1 through stage 4 chronic kidney disease, or unspecified chronic kidney disease: Secondary | ICD-10-CM | POA: Diagnosis not present

## 2021-06-30 DIAGNOSIS — R600 Localized edema: Secondary | ICD-10-CM | POA: Diagnosis not present

## 2021-06-30 DIAGNOSIS — N189 Chronic kidney disease, unspecified: Secondary | ICD-10-CM | POA: Diagnosis not present

## 2021-06-30 DIAGNOSIS — R809 Proteinuria, unspecified: Secondary | ICD-10-CM | POA: Diagnosis not present

## 2021-06-30 DIAGNOSIS — I509 Heart failure, unspecified: Secondary | ICD-10-CM | POA: Diagnosis not present

## 2021-06-30 DIAGNOSIS — N184 Chronic kidney disease, stage 4 (severe): Secondary | ICD-10-CM | POA: Diagnosis not present

## 2021-06-30 DIAGNOSIS — I5022 Chronic systolic (congestive) heart failure: Secondary | ICD-10-CM | POA: Diagnosis not present

## 2021-06-30 DIAGNOSIS — Z0000 Encounter for general adult medical examination without abnormal findings: Secondary | ICD-10-CM | POA: Diagnosis not present

## 2021-06-30 DIAGNOSIS — N2581 Secondary hyperparathyroidism of renal origin: Secondary | ICD-10-CM | POA: Diagnosis not present

## 2021-06-30 DIAGNOSIS — Z125 Encounter for screening for malignant neoplasm of prostate: Secondary | ICD-10-CM | POA: Diagnosis not present

## 2021-06-30 DIAGNOSIS — E1122 Type 2 diabetes mellitus with diabetic chronic kidney disease: Secondary | ICD-10-CM | POA: Diagnosis not present

## 2021-06-30 DIAGNOSIS — K219 Gastro-esophageal reflux disease without esophagitis: Secondary | ICD-10-CM | POA: Diagnosis not present

## 2021-07-02 ENCOUNTER — Other Ambulatory Visit: Payer: Self-pay | Admitting: Internal Medicine

## 2021-07-02 DIAGNOSIS — K219 Gastro-esophageal reflux disease without esophagitis: Secondary | ICD-10-CM

## 2021-07-09 DIAGNOSIS — I509 Heart failure, unspecified: Secondary | ICD-10-CM | POA: Diagnosis not present

## 2021-07-13 ENCOUNTER — Telehealth: Payer: Self-pay

## 2021-07-13 NOTE — Telephone Encounter (Signed)
Completed medical records for [facility]. Payment request of $15.75 and records faxed to [place] fax number [phone]

## 2021-07-14 DIAGNOSIS — R600 Localized edema: Secondary | ICD-10-CM | POA: Diagnosis not present

## 2021-07-14 DIAGNOSIS — I5032 Chronic diastolic (congestive) heart failure: Secondary | ICD-10-CM | POA: Diagnosis not present

## 2021-07-14 DIAGNOSIS — Z6841 Body Mass Index (BMI) 40.0 and over, adult: Secondary | ICD-10-CM | POA: Diagnosis not present

## 2021-07-14 DIAGNOSIS — N184 Chronic kidney disease, stage 4 (severe): Secondary | ICD-10-CM | POA: Diagnosis not present

## 2021-07-14 DIAGNOSIS — Z794 Long term (current) use of insulin: Secondary | ICD-10-CM | POA: Diagnosis not present

## 2021-07-14 DIAGNOSIS — E119 Type 2 diabetes mellitus without complications: Secondary | ICD-10-CM | POA: Diagnosis not present

## 2021-07-14 DIAGNOSIS — I509 Heart failure, unspecified: Secondary | ICD-10-CM | POA: Diagnosis not present

## 2021-07-14 DIAGNOSIS — I10 Essential (primary) hypertension: Secondary | ICD-10-CM | POA: Diagnosis not present

## 2021-07-14 DIAGNOSIS — I480 Paroxysmal atrial fibrillation: Secondary | ICD-10-CM | POA: Diagnosis not present

## 2021-07-14 DIAGNOSIS — G4733 Obstructive sleep apnea (adult) (pediatric): Secondary | ICD-10-CM | POA: Diagnosis not present

## 2021-07-14 DIAGNOSIS — I2510 Atherosclerotic heart disease of native coronary artery without angina pectoris: Secondary | ICD-10-CM | POA: Diagnosis not present

## 2021-07-27 ENCOUNTER — Telehealth: Payer: Self-pay

## 2021-07-27 NOTE — Telephone Encounter (Signed)
Spoke to [place], [facility], to update her that pt needs to be seen in the office before he can be referred to physical and occupational therapy

## 2021-07-28 ENCOUNTER — Telehealth: Payer: Self-pay

## 2021-07-28 NOTE — Telephone Encounter (Signed)
Oxygen therapy order signed by provider and placed in [facility] folder. 

## 2021-08-05 ENCOUNTER — Other Ambulatory Visit: Payer: Self-pay | Admitting: Physician Assistant

## 2021-08-05 DIAGNOSIS — I152 Hypertension secondary to endocrine disorders: Secondary | ICD-10-CM

## 2021-08-11 DIAGNOSIS — N2581 Secondary hyperparathyroidism of renal origin: Secondary | ICD-10-CM | POA: Diagnosis not present

## 2021-08-11 DIAGNOSIS — E1122 Type 2 diabetes mellitus with diabetic chronic kidney disease: Secondary | ICD-10-CM | POA: Diagnosis not present

## 2021-08-11 DIAGNOSIS — I509 Heart failure, unspecified: Secondary | ICD-10-CM | POA: Diagnosis not present

## 2021-08-11 DIAGNOSIS — N184 Chronic kidney disease, stage 4 (severe): Secondary | ICD-10-CM | POA: Diagnosis not present

## 2021-08-11 DIAGNOSIS — I129 Hypertensive chronic kidney disease with stage 1 through stage 4 chronic kidney disease, or unspecified chronic kidney disease: Secondary | ICD-10-CM | POA: Diagnosis not present

## 2021-08-11 DIAGNOSIS — R809 Proteinuria, unspecified: Secondary | ICD-10-CM | POA: Diagnosis not present

## 2021-08-11 DIAGNOSIS — N189 Chronic kidney disease, unspecified: Secondary | ICD-10-CM | POA: Diagnosis not present

## 2021-08-13 DIAGNOSIS — G4733 Obstructive sleep apnea (adult) (pediatric): Secondary | ICD-10-CM | POA: Diagnosis not present

## 2021-08-18 ENCOUNTER — Ambulatory Visit (INDEPENDENT_AMBULATORY_CARE_PROVIDER_SITE_OTHER): Payer: HMO | Admitting: Nurse Practitioner

## 2021-08-18 ENCOUNTER — Other Ambulatory Visit: Payer: Self-pay

## 2021-08-18 ENCOUNTER — Ambulatory Visit: Payer: HMO | Admitting: Internal Medicine

## 2021-08-18 ENCOUNTER — Encounter: Payer: Self-pay | Admitting: Nurse Practitioner

## 2021-08-18 VITALS — BP 128/47 | HR 83 | Temp 98.4°F | Resp 16 | Ht 67.0 in | Wt 318.2 lb

## 2021-08-18 DIAGNOSIS — E1122 Type 2 diabetes mellitus with diabetic chronic kidney disease: Secondary | ICD-10-CM | POA: Diagnosis not present

## 2021-08-18 DIAGNOSIS — M1A3310 Chronic gout due to renal impairment, right wrist, without tophus (tophi): Secondary | ICD-10-CM

## 2021-08-18 DIAGNOSIS — E1165 Type 2 diabetes mellitus with hyperglycemia: Secondary | ICD-10-CM

## 2021-08-18 DIAGNOSIS — I10 Essential (primary) hypertension: Secondary | ICD-10-CM | POA: Diagnosis not present

## 2021-08-18 DIAGNOSIS — Z794 Long term (current) use of insulin: Secondary | ICD-10-CM

## 2021-08-18 LAB — POCT GLYCOSYLATED HEMOGLOBIN (HGB A1C): Hemoglobin A1C: 8.5 % — AB (ref 4.0–5.6)

## 2021-08-18 MED ORDER — OXYCODONE HCL 5 MG PO TABS: 5.0000 mg | ORAL_TABLET | Freq: Four times a day (QID) | ORAL | 0 refills | Status: DC | PRN

## 2021-08-18 MED ORDER — GLIPIZIDE ER 10 MG PO TB24: 10.0000 mg | ORAL_TABLET | Freq: Every day | ORAL | 5 refills | Status: DC

## 2021-08-18 NOTE — Progress Notes (Signed)
[facility] [place], [place] [address]  Internal MEDICINE  Office Visit Note  Patient Name: Jason Hudson  [id number]  [id number]  Date of Service: 08/18/2021  Chief Complaint  Patient presents with   Follow-up   Hypertension   Diabetes   Gastroesophageal Reflux   Medication Refill    Needs refill for oxycodone    HPI Jason Hudson presents for a follow up visit for diabetes, hypertension and medication refills. He was also considering a physical therapy referral but changed his mind.  A1c is 8.5 today which is improved from 9.1 in august.  His systolic BP is normal, diastolic BP is slightly low. He was taking losartan but this was discontinued due to advanced kidney disease. His specialist put him on 80 mg torsemide daily.  He has lost 20 lbs since his previous office visit in September. Current weight is 318 lbs.    Current Medication: Outpatient Encounter Medications as of 08/18/2021  Medication Sig Note   albuterol (VENTOLIN HFA) 108 (90 Base) MCG/ACT inhaler Inhale 1-2 puffs into the lungs every 6 (six) hours as needed for wheezing or shortness of breath.    aspirin EC 81 MG tablet Take 81 mg by mouth daily. Swallow whole.    benzonatate (TESSALON) 100 MG capsule Take 1 capsule (100 mg total) by mouth 2 (two) times daily as needed for cough.    Budeson-Glycopyrrol-Formoterol (BREZTRI AEROSPHERE) 160-9-4.8 MCG/ACT AERO Inhale 2 puffs into the lungs 2 (two) times daily.    carvedilol (COREG) 6.25 MG tablet Take 6.25 mg by mouth 2 (two) times daily with a meal. Pt gets from cardiologist    Cholecalciferol (VITAMIN D3) 50 MCG (2000 UT) TABS Take 2,000 Units by mouth at bedtime.    colchicine 0.6 MG tablet Take 1 tablet (0.6 mg total) by mouth 2 (two) times daily.    diltiazem (TIAZAC) 360 MG 24 hr capsule Take 1 capsule (360 mg total) by mouth daily.    doxazosin (CARDURA) 4 MG tablet TAKE 1 TABLET BY MOUTH AT BEDTIME    fluticasone (FLONASE) 50  MCG/ACT nasal spray Place 2 sprays into both nostrils 2 (two) times daily.     insulin degludec (TRESIBA) 200 UNIT/ML FlexTouch Pen 120 Units. Given by endo    ipratropium (ATROVENT) 0.02 % nebulizer solution Take 0.5 mg by nebulization every 6 (six) hours as needed for wheezing or shortness of breath.    levothyroxine (SYNTHROID) 88 MCG tablet Take 1 tablet (88 mcg total) by mouth daily before breakfast.    loratadine (CLARITIN) 10 MG tablet Take 10 mg by mouth daily.  08/24/2018: Takes liqui-gels at home   metolazone (ZAROXOLYN) 5 MG tablet TAKE 1 TABLET BY MOUTH DAILY FOR SWELLING    multivitamin (ONE-A-DAY MEN'S) TABS tablet Take 1 tablet by mouth daily.    nitroGLYCERIN (NITROSTAT) 0.4 MG SL tablet Place 1 tablet (0.4 mg total) under the tongue every 5 (five) minutes as needed for chest pain.    potassium chloride (KLOR-CON) 10 MEQ tablet TAKE TWO TABLETS EVERY OTHER DAY AND 1 TABLET ON THE DAYS IN BETWEEN    [DISCONTINUED] febuxostat (ULORIC) 40 MG tablet Take 1 tablet (40 mg total) by mouth daily.    [DISCONTINUED] glipiZIDE (GLUCOTROL XL) 10 MG 24 hr tablet TAKE ONE TABLET EVERY DAY    [DISCONTINUED] oxyCODONE (OXY IR/ROXICODONE) 5 MG immediate release tablet Take 1 tablet (5 mg total) by mouth every 6 (six) hours as needed for severe pain.    [DISCONTINUED]  traZODone (DESYREL) 50 MG tablet TAKE ONE TABLET AT BEDTIME AS NEEDED FORSLEEP    amiodarone (PACERONE) 200 MG tablet Take 1 tablet (200 mg total) by mouth daily for 30 days.    glipiZIDE (GLUCOTROL XL) 10 MG 24 hr tablet Take 1 tablet (10 mg total) by mouth daily.    hydrALAZINE (APRESOLINE) 25 MG tablet Take 25 mg by mouth 3 (three) times daily.    isosorbide mononitrate (IMDUR) 30 MG 24 hr tablet Take 30 mg by mouth daily.    oxyCODONE (OXY IR/ROXICODONE) 5 MG immediate release tablet Take 1 tablet (5 mg total) by mouth every 6 (six) hours as needed for severe pain.    No facility-administered encounter medications on file as of  08/18/2021.    Surgical History: Past Surgical History:  Procedure Laterality Date   COLONOSCOPY WITH PROPOFOL N/A 11/07/2017   Procedure: COLONOSCOPY WITH PROPOFOL;  Surgeon: [name], MD;  Location: [facility] ENDOSCOPY;  Service: Gastroenterology;  Laterality: N/A;   ESOPHAGOGASTRODUODENOSCOPY (EGD) WITH PROPOFOL N/A 11/07/2017   Procedure: ESOPHAGOGASTRODUODENOSCOPY (EGD) WITH PROPOFOL;  Surgeon: [name], MD;  Location: [facility] ENDOSCOPY;  Service: Gastroenterology;  Laterality: N/A;   LEFT HEART CATH AND CORONARY ANGIOGRAPHY N/A 10/23/2017   Procedure: LEFT HEART CATH AND CORONARY ANGIOGRAPHY;  Surgeon: Isaias Cowman, MD;  Location: [facility] CV LAB;  Service: Cardiovascular;  Laterality: N/A;    Medical History: Past Medical History:  Diagnosis Date   CHF (congestive heart failure) (HCC)    COPD (chronic obstructive pulmonary disease) (HCC)    Diabetes mellitus without complication (HCC)    Edema    leg swelling   Gastroesophageal reflux disease 12/15/2017   Gout    Hypertension    Melena 11/05/2017   Pneumonia 07/27/2016    Family History: Family History  Problem Relation Age of Onset   Cancer Mother    CVA Father    Heart disease Father     Social History   Socioeconomic History   Marital status: Single    Spouse name: Not on file   Number of children: Not on file   Years of education: Not on file   Highest education level: Not on file  Occupational History   Not on file  Tobacco Use   Smoking status: Never   Smokeless tobacco: Never  Substance and Sexual Activity   Alcohol use: Not Currently    Comment: ocassionally   Drug use: Never   Sexual activity: Not on file  Other Topics Concern   Not on file  Social History Narrative   Not on file   Social Determinants of Health   Financial Resource Strain: Not on file  Food Insecurity: Not on file  Transportation Needs: Not on file  Physical Activity: Not on file  Stress: Not on file  Social  Connections: Not on file  Intimate Partner Violence: Not on file      Review of Systems  Constitutional:  Negative for chills, fatigue and unexpected weight change.  HENT:  Negative for congestion, rhinorrhea, sneezing and sore throat.   Eyes:  Negative for redness.  Respiratory:  Negative for cough, chest tightness and shortness of breath.   Cardiovascular:  Negative for chest pain and palpitations.  Gastrointestinal:  Negative for abdominal pain, constipation, diarrhea, nausea and vomiting.  Genitourinary:  Negative for dysuria and frequency.  Musculoskeletal:  Negative for arthralgias, back pain, joint swelling and neck pain.  Skin:  Negative for rash.  Neurological: Negative.  Negative for tremors and numbness.  Hematological:  Negative for adenopathy. Does not bruise/bleed easily.  Psychiatric/Behavioral:  Negative for behavioral problems (Depression), sleep disturbance and suicidal ideas. The patient is not nervous/anxious.    Vital Signs: BP (!) 128/47    Pulse 83    Temp 98.4 F (36.9 C)    Resp 16    Ht 5\' 7"  (1.702 m)    Wt (!) 318 lb 3.2 oz (144.3 kg)    SpO2 95%    BMI 49.84 kg/m    Physical Exam Vitals reviewed.  Constitutional:      General: He is not in acute distress.    Appearance: Normal appearance. He is obese. He is not ill-appearing.  HENT:     Head: Normocephalic and atraumatic.  Eyes:     Pupils: Pupils are equal, round, and reactive to light.  Cardiovascular:     Rate and Rhythm: Normal rate and regular rhythm.  Pulmonary:     Effort: Pulmonary effort is normal. No respiratory distress.  Neurological:     Mental Status: He is alert and oriented to person, place, and time.     Cranial Nerves: No cranial nerve deficit.     Coordination: Coordination normal.     Gait: Gait normal.  Psychiatric:        Mood and Affect: Mood normal.        Behavior: Behavior normal.       Assessment/Plan: 1. Type 2 diabetes mellitus with chronic kidney disease,  with long-term current use of insulin, unspecified CKD stage (HCC) A1C is slightly improved at 8.5 today, glipizide refills ordered. Continue medications as prescribed. Follow up in 3 months for repeat A1C.  - POCT HgB A1C - glipiZIDE (GLUCOTROL XL) 10 MG 24 hr tablet; Take 1 tablet (10 mg total) by mouth daily.  Dispense: 30 tablet; Refill: 5  2. Essential hypertension Blood pressure is stable, the diastolic BP is slightly low, he is being followed by cardiology.  - hydrALAZINE (APRESOLINE) 25 MG tablet; Take 25 mg by mouth 3 (three) times daily.  3. 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  - oxyCODONE (OXY IR/ROXICODONE) 5 MG immediate release tablet; Take 1 tablet (5 mg total) by mouth every 6 (six) hours as needed for severe pain.  Dispense: 60 tablet; Refill: 0   General Counseling: Malva Cogan understanding of the findings of todays visit and agrees with plan of treatment. I have discussed any further diagnostic evaluation that may be needed or ordered today. We also reviewed his medications today. he has been encouraged to call the office with any questions or concerns that should arise related to todays visit.    Orders Placed This Encounter  Procedures   POCT HgB A1C    Meds ordered this encounter  Medications   oxyCODONE (OXY IR/ROXICODONE) 5 MG immediate release tablet    Sig: Take 1 tablet (5 mg total) by mouth every 6 (six) hours as needed for severe pain.    Dispense:  60 tablet    Refill:  0   glipiZIDE (GLUCOTROL XL) 10 MG 24 hr tablet    Sig: Take 1 tablet (10 mg total) by mouth daily.    Dispense:  30 tablet    Refill:  5    Return in about 3 months (around 11/16/2021) for F/U, Recheck A1C, Mckenze Slone PCP.   Total time spent:30 Minutes Time spent includes review of chart, medications, test results, and follow up plan with the  patient.   [place] Controlled Substance Database was reviewed by  me.  This patient was seen by Jonetta Osgood, FNP-C in collaboration with Dr. Clayborn Bigness as a part of collaborative care agreement.   Zunaira Lamy R. Valetta Fuller, MSN, FNP-C Internal medicine

## 2021-08-20 ENCOUNTER — Telehealth: Payer: Self-pay

## 2021-08-20 NOTE — Telephone Encounter (Signed)
Medical records request completed. Faxed requesting records to [facility] at [phone].

## 2021-08-25 ENCOUNTER — Telehealth: Payer: Self-pay

## 2021-08-25 NOTE — Telephone Encounter (Signed)
Completed medical records for [facility] Faxed payment request of $9.75 and records to [phone]

## 2021-08-30 ENCOUNTER — Other Ambulatory Visit: Payer: Self-pay | Admitting: Nurse Practitioner

## 2021-08-30 DIAGNOSIS — M1A3310 Chronic gout due to renal impairment, right wrist, without tophus (tophi): Secondary | ICD-10-CM

## 2021-08-30 DIAGNOSIS — F5104 Psychophysiologic insomnia: Secondary | ICD-10-CM

## 2021-09-19 ENCOUNTER — Encounter: Payer: Self-pay | Admitting: Nurse Practitioner

## 2021-09-20 ENCOUNTER — Encounter: Payer: Self-pay | Admitting: Nurse Practitioner

## 2021-09-20 ENCOUNTER — Telehealth (INDEPENDENT_AMBULATORY_CARE_PROVIDER_SITE_OTHER): Payer: HMO | Admitting: Nurse Practitioner

## 2021-09-20 VITALS — BP 123/70 | HR 75 | Resp 16 | Ht 68.0 in | Wt 315.0 lb

## 2021-09-20 DIAGNOSIS — R051 Acute cough: Secondary | ICD-10-CM | POA: Diagnosis not present

## 2021-09-20 DIAGNOSIS — J0110 Acute frontal sinusitis, unspecified: Secondary | ICD-10-CM | POA: Diagnosis not present

## 2021-09-20 DIAGNOSIS — J441 Chronic obstructive pulmonary disease with (acute) exacerbation: Secondary | ICD-10-CM | POA: Diagnosis not present

## 2021-09-20 MED ORDER — BENZONATATE 200 MG PO CAPS: 200.0000 mg | ORAL_CAPSULE | Freq: Two times a day (BID) | ORAL | 0 refills | Status: DC | PRN

## 2021-09-20 MED ORDER — PREDNISONE 10 MG PO TABS: ORAL_TABLET | ORAL | 0 refills | Status: DC

## 2021-09-20 MED ORDER — LEVOFLOXACIN 750 MG PO TABS: 750.0000 mg | ORAL_TABLET | Freq: Every day | ORAL | 0 refills | Status: AC

## 2021-09-20 NOTE — Progress Notes (Signed)
[facility] [place], [place] [address]  Internal MEDICINE  Telephone Visit  Patient Name: Jason Hudson  [id number]  [id number]  Date of Service: 09/20/2021  I connected with the patient at 2:30 PM by telephone and verified the patients identity using two identifiers.   I discussed the limitations, risks, security and privacy concerns of performing an evaluation and management service by telephone and the availability of in person appointments. I also discussed with the patient that there may be a patient responsible charge related to the service.  The patient expressed understanding and agrees to proceed.    Chief Complaint  Patient presents with   Acute Visit    Congestion, drainage, neg covid test, discuss pulse    Telephone Assessment    Video    Telephone Screen    [phone]   Sinusitis   Cough    Very slight    Sore Throat   Fatigue    No energy     HPI Jason Hudson presents for a telehealth virtual visit for symptoms of sinusitis. He reports severe cough, sore throat, fatigue, nasal congestion and post nasal drip. He had a negative covid test. He has SOB, wheezing and chest tightness. He is worried that he is developing pneumonia.    Current Medication: Outpatient Encounter Medications as of 09/20/2021  Medication Sig Note   albuterol (VENTOLIN HFA) 108 (90 Base) MCG/ACT inhaler Inhale 1-2 puffs into the lungs every 6 (six) hours as needed for wheezing or shortness of breath.    aspirin EC 81 MG tablet Take 81 mg by mouth daily. Swallow whole.    benzonatate (TESSALON) 200 MG capsule Take 1 capsule (200 mg total) by mouth 2 (two) times daily as needed for cough.    Budeson-Glycopyrrol-Formoterol (BREZTRI AEROSPHERE) 160-9-4.8 MCG/ACT AERO Inhale 2 puffs into the lungs 2 (two) times daily.    carvedilol (COREG) 6.25 MG tablet Take 6.25 mg by mouth 2 (two) times daily with a meal. Pt gets from cardiologist    Cholecalciferol (VITAMIN D3) 50  MCG (2000 UT) TABS Take 2,000 Units by mouth at bedtime.    colchicine 0.6 MG tablet Take 1 tablet (0.6 mg total) by mouth 2 (two) times daily.    diltiazem (TIAZAC) 360 MG 24 hr capsule Take 1 capsule (360 mg total) by mouth daily.    doxazosin (CARDURA) 4 MG tablet TAKE 1 TABLET BY MOUTH AT BEDTIME    febuxostat (ULORIC) 40 MG tablet TAKE ONE TABLET EVERY DAY    fluticasone (FLONASE) 50 MCG/ACT nasal spray Place 2 sprays into both nostrils 2 (two) times daily.     glipiZIDE (GLUCOTROL XL) 10 MG 24 hr tablet Take 1 tablet (10 mg total) by mouth daily.    hydrALAZINE (APRESOLINE) 25 MG tablet Take 25 mg by mouth 3 (three) times daily.    insulin degludec (TRESIBA) 200 UNIT/ML FlexTouch Pen 120 Units. Given by endo    ipratropium (ATROVENT) 0.02 % nebulizer solution Take 0.5 mg by nebulization every 6 (six) hours as needed for wheezing or shortness of breath.    isosorbide mononitrate (IMDUR) 30 MG 24 hr tablet Take 30 mg by mouth daily.    levofloxacin (LEVAQUIN) 750 MG tablet Take 1 tablet (750 mg total) by mouth daily for 7 days.    levothyroxine (SYNTHROID) 88 MCG tablet Take 1 tablet (88 mcg total) by mouth daily before breakfast.    loratadine (CLARITIN) 10 MG tablet Take 10 mg by mouth daily.  08/24/2018:  Takes liqui-gels at home   metolazone (ZAROXOLYN) 5 MG tablet TAKE 1 TABLET BY MOUTH DAILY FOR SWELLING    multivitamin (ONE-A-DAY MEN'S) TABS tablet Take 1 tablet by mouth daily.    nitroGLYCERIN (NITROSTAT) 0.4 MG SL tablet Place 1 tablet (0.4 mg total) under the tongue every 5 (five) minutes as needed for chest pain.    oxyCODONE (OXY IR/ROXICODONE) 5 MG immediate release tablet Take 1 tablet (5 mg total) by mouth every 6 (six) hours as needed for severe pain.    potassium chloride (KLOR-CON) 10 MEQ tablet TAKE TWO TABLETS EVERY OTHER DAY AND 1 TABLET ON THE DAYS IN BETWEEN    predniSONE (DELTASONE) 10 MG tablet Take one tab 3 x day for 3 days, then take one tab 2 x a day for 3 days and  then take one tab a day for 3 days for copd    traZODone (DESYREL) 50 MG tablet TAKE ONE TABLET AT BEDTIME AS NEEDED FORSLEEP    amiodarone (PACERONE) 200 MG tablet Take 1 tablet (200 mg total) by mouth daily for 30 days.    [DISCONTINUED] benzonatate (TESSALON) 100 MG capsule Take 1 capsule (100 mg total) by mouth 2 (two) times daily as needed for cough. (Patient not taking: Reported on 09/20/2021)    No facility-administered encounter medications on file as of 09/20/2021.    Surgical History: Past Surgical History:  Procedure Laterality Date   COLONOSCOPY WITH PROPOFOL N/A 11/07/2017   Procedure: COLONOSCOPY WITH PROPOFOL;  Surgeon: [name], MD;  Location[facility] ENDOSCOPY;  Service: Gastroenterology;  Laterality: N/A;   ESOPHAGOGASTRODUODENOSCOPY (EGD) WITH PROPOFOL N/A 11/07/2017   Procedure: ESOPHAGOGASTRODUODENOSCOPY (EGD) WITH PROPOFOL;  Surgeon: [name], MD;  Location: [facility] ENDOSCOPY;  Service: Gastroenterology;  Laterality: N/A;   LEFT HEART CATH AND CORONARY ANGIOGRAPHY N/A 10/23/2017   Procedure: LEFT HEART CATH AND CORONARY ANGIOGRAPHY;  Surgeon: Isaias Cowman, MD;  Location: [place] CV LAB;  Service: Cardiovascular;  Laterality: N/A;    Medical History: Past Medical History:  Diagnosis Date   CHF (congestive heart failure) (HCC)    COPD (chronic obstructive pulmonary disease) (HCC)    Diabetes mellitus without complication (HCC)    Edema    leg swelling   Gastroesophageal reflux disease 12/15/2017   Gout    Hypertension    Melena 11/05/2017   Pneumonia 07/27/2016    Family History: Family History  Problem Relation Age of Onset   Cancer Mother    CVA Father    Heart disease Father     Social History   Socioeconomic History   Marital status: Single    Spouse name: Not on file   Number of children: Not on file   Years of education: Not on file   Highest education level: Not on file  Occupational History   Not on file  Tobacco Use   Smoking  status: Never   Smokeless tobacco: Never  Substance and Sexual Activity   Alcohol use: Not Currently    Comment: ocassionally   Drug use: Never   Sexual activity: Not on file  Other Topics Concern   Not on file  Social History Narrative   Not on file   Social Determinants of Health   Financial Resource Strain: Not on file  Food Insecurity: Not on file  Transportation Needs: Not on file  Physical Activity: Not on file  Stress: Not on file  Social Connections: Not on file  Intimate Partner Violence: Not on file  Review of Systems  Constitutional:  Positive for fatigue. Negative for chills and fever.  HENT:  Positive for congestion, postnasal drip, rhinorrhea, sinus pressure, sinus pain and sore throat. Negative for ear pain and trouble swallowing.   Eyes:  Negative for pain.  Respiratory:  Positive for cough, chest tightness, shortness of breath and wheezing.   Cardiovascular: Negative.  Negative for chest pain and palpitations.  Gastrointestinal:  Negative for abdominal pain, constipation, diarrhea, nausea and vomiting.  Musculoskeletal:  Negative for myalgias.  Skin:  Negative for rash.   Vital Signs: BP 123/70    Pulse 75    Resp 16    Ht 5\' 8"  (1.727 m)    Wt (!) 315 lb (142.9 kg)    BMI 47.90 kg/m    Observation/Objective: He is alert and oriented and engages in conversation appropriately. He does not appear to be in any acute distress over video call.     Assessment/Plan: 1. Acute non-recurrent frontal sinusitis Empiric antibiotic treatment prescribed, levofloxacin prescribed because it can treat sinisitis and pneumonia.  - levofloxacin (LEVAQUIN) 750 MG tablet; Take 1 tablet (750 mg total) by mouth daily for 7 days.  Dispense: 7 tablet; Refill: 0  2. Chronic obstructive pulmonary disease with acute exacerbation (HCC) Prednisone prescribed  - levofloxacin (LEVAQUIN) 750 MG tablet; Take 1 tablet (750 mg total) by mouth daily for 7 days.  Dispense: 7 tablet;  Refill: 0 - predniSONE (DELTASONE) 10 MG tablet; Take one tab 3 x day for 3 days, then take one tab 2 x a day for 3 days and then take one tab a day for 3 days for copd  Dispense: 18 tablet; Refill: 0  3. Acute cough Medication prescribed for symptomatic treatment of cough - benzonatate (TESSALON) 200 MG capsule; Take 1 capsule (200 mg total) by mouth 2 (two) times daily as needed for cough.  Dispense: 30 capsule; Refill: 0   General Counseling: Malva Cogan understanding of the findings of today's phone visit and agrees with plan of treatment. I have discussed any further diagnostic evaluation that may be needed or ordered today. We also reviewed his medications today. he has been encouraged to call the office with any questions or concerns that should arise related to todays visit.  Return if symptoms worsen or fail to improve.   No orders of the defined types were placed in this encounter.   Meds ordered this encounter  Medications   levofloxacin (LEVAQUIN) 750 MG tablet    Sig: Take 1 tablet (750 mg total) by mouth daily for 7 days.    Dispense:  7 tablet    Refill:  0   predniSONE (DELTASONE) 10 MG tablet    Sig: Take one tab 3 x day for 3 days, then take one tab 2 x a day for 3 days and then take one tab a day for 3 days for copd    Dispense:  18 tablet    Refill:  0   benzonatate (TESSALON) 200 MG capsule    Sig: Take 1 capsule (200 mg total) by mouth 2 (two) times daily as needed for cough.    Dispense:  30 capsule    Refill:  0    Time spent:10 Minutes Time spent with patient included reviewing progress notes, labs, imaging studies, and discussing plan for follow up.  [place] Controlled Substance Database was reviewed by me for overdose risk score (ORS) if appropriate.  This patient was seen by Jonetta Osgood, FNP-C in collaboration with  Dr. Clayborn Bigness as a part of collaborative care agreement.  Tywanna Seifer R. Valetta Fuller, MSN, FNP-C Internal medicine

## 2021-10-01 DIAGNOSIS — Z6841 Body Mass Index (BMI) 40.0 and over, adult: Secondary | ICD-10-CM | POA: Diagnosis not present

## 2021-10-01 DIAGNOSIS — Z794 Long term (current) use of insulin: Secondary | ICD-10-CM | POA: Diagnosis not present

## 2021-10-01 DIAGNOSIS — E1122 Type 2 diabetes mellitus with diabetic chronic kidney disease: Secondary | ICD-10-CM | POA: Diagnosis not present

## 2021-10-01 DIAGNOSIS — I5022 Chronic systolic (congestive) heart failure: Secondary | ICD-10-CM | POA: Diagnosis not present

## 2021-10-01 DIAGNOSIS — R600 Localized edema: Secondary | ICD-10-CM | POA: Diagnosis not present

## 2021-10-01 DIAGNOSIS — N184 Chronic kidney disease, stage 4 (severe): Secondary | ICD-10-CM | POA: Diagnosis not present

## 2021-10-12 ENCOUNTER — Telehealth: Payer: Self-pay

## 2021-10-12 NOTE — Telephone Encounter (Signed)
[facility] called that pt colchicine interact with amiodarone and carvedilol advised jackie that pt is already on those medicine and we prescribed this med on 7/22 and other he is on long time we will keep monitor

## 2021-10-22 DIAGNOSIS — N184 Chronic kidney disease, stage 4 (severe): Secondary | ICD-10-CM | POA: Diagnosis not present

## 2021-10-22 DIAGNOSIS — E113599 Type 2 diabetes mellitus with proliferative diabetic retinopathy without macular edema, unspecified eye: Secondary | ICD-10-CM | POA: Diagnosis not present

## 2021-10-22 DIAGNOSIS — Z794 Long term (current) use of insulin: Secondary | ICD-10-CM | POA: Diagnosis not present

## 2021-10-22 DIAGNOSIS — E1165 Type 2 diabetes mellitus with hyperglycemia: Secondary | ICD-10-CM | POA: Diagnosis not present

## 2021-10-22 DIAGNOSIS — E1151 Type 2 diabetes mellitus with diabetic peripheral angiopathy without gangrene: Secondary | ICD-10-CM | POA: Diagnosis not present

## 2021-10-22 DIAGNOSIS — E1122 Type 2 diabetes mellitus with diabetic chronic kidney disease: Secondary | ICD-10-CM | POA: Diagnosis not present

## 2021-10-22 DIAGNOSIS — I872 Venous insufficiency (chronic) (peripheral): Secondary | ICD-10-CM | POA: Diagnosis not present

## 2021-11-10 ENCOUNTER — Encounter: Payer: Self-pay | Admitting: Nurse Practitioner

## 2021-11-10 ENCOUNTER — Other Ambulatory Visit: Payer: Self-pay

## 2021-11-10 ENCOUNTER — Ambulatory Visit (INDEPENDENT_AMBULATORY_CARE_PROVIDER_SITE_OTHER): Payer: HMO | Admitting: Nurse Practitioner

## 2021-11-10 VITALS — BP 133/60 | HR 70 | Temp 98.6°F | Resp 16 | Ht 68.0 in | Wt 324.6 lb

## 2021-11-10 DIAGNOSIS — I10 Essential (primary) hypertension: Secondary | ICD-10-CM

## 2021-11-10 DIAGNOSIS — M1A3310 Chronic gout due to renal impairment, right wrist, without tophus (tophi): Secondary | ICD-10-CM | POA: Diagnosis not present

## 2021-11-10 DIAGNOSIS — R600 Localized edema: Secondary | ICD-10-CM

## 2021-11-10 DIAGNOSIS — E1122 Type 2 diabetes mellitus with diabetic chronic kidney disease: Secondary | ICD-10-CM | POA: Diagnosis not present

## 2021-11-10 DIAGNOSIS — Z794 Long term (current) use of insulin: Secondary | ICD-10-CM

## 2021-11-10 DIAGNOSIS — J0110 Acute frontal sinusitis, unspecified: Secondary | ICD-10-CM | POA: Diagnosis not present

## 2021-11-10 DIAGNOSIS — R051 Acute cough: Secondary | ICD-10-CM | POA: Diagnosis not present

## 2021-11-10 DIAGNOSIS — J441 Chronic obstructive pulmonary disease with (acute) exacerbation: Secondary | ICD-10-CM

## 2021-11-10 LAB — POCT GLYCOSYLATED HEMOGLOBIN (HGB A1C): Hemoglobin A1C: 7.6 % — AB (ref 4.0–5.6)

## 2021-11-10 MED ORDER — INSULIN DEGLUDEC 200 UNIT/ML ~~LOC~~ SOPN: 120.0000 [IU] | PEN_INJECTOR | Freq: Every day | SUBCUTANEOUS | 5 refills | Status: DC

## 2021-11-10 MED ORDER — AZITHROMYCIN 250 MG PO TABS: ORAL_TABLET | ORAL | 0 refills | Status: AC

## 2021-11-10 MED ORDER — OXYCODONE HCL 5 MG PO TABS: 5.0000 mg | ORAL_TABLET | Freq: Four times a day (QID) | ORAL | 0 refills | Status: DC | PRN

## 2021-11-10 MED ORDER — ALLOPURINOL 100 MG PO TABS: 100.0000 mg | ORAL_TABLET | Freq: Every day | ORAL | 2 refills | Status: DC

## 2021-11-10 NOTE — Progress Notes (Signed)
[facility] ?[address] ?[place], [place] [address] ? ?Internal MEDICINE  ?Office Visit Note ? ?Patient Name: Jason Hudson ? [id number]  ?[id number] ? ?Date of Service: 11/10/2021 ? ?Chief Complaint  ?Patient presents with  ? Follow-up  ? Sinusitis  ?  SOB  ? Cough  ? Diabetes  ? Gastroesophageal Reflux  ? Hypertension  ? COPD  ? ? ?HPI ?Mekel presents for follow-up visit for diabetes, hypertension, COPD, acute cough, and symptoms of developing sinusitis.  His A1c was 7.6 today which is significantly improved from 8.5 in December.  Patient has gained 9 pounds since his previous office visit in January.  His blood pressure is stable on current medications.  He does need refills of oxycodone and Tresiba. ?He reports having symptoms of sinusitis including intermittent shortness of breath, cough, wheezing, chest tightness, sinus pressure, nasal congestion, runny nose, sore throat, ear pain and fullness.  Patient denies any fever, chills or body aches. ? ? ?Current Medication: ?Outpatient Encounter Medications as of 11/10/2021  ?Medication Sig Note  ? albuterol (VENTOLIN HFA) 108 (90 Base) MCG/ACT inhaler Inhale 1-2 puffs into the lungs every 6 (six) hours as needed for wheezing or shortness of breath.   ? aspirin EC 81 MG tablet Take 81 mg by mouth daily. Swallow whole.   ? azithromycin (ZITHROMAX) 250 MG tablet Take 2 tablets on day 1, then 1 tablet daily on days 2 through 5   ? carvedilol (COREG) 6.25 MG tablet Take 6.25 mg by mouth 2 (two) times daily with a meal. Pt gets from cardiologist   ? Cholecalciferol (VITAMIN D3) 50 MCG (2000 UT) TABS Take 2,000 Units by mouth at bedtime.   ? colchicine 0.6 MG tablet Take 1 tablet (0.6 mg total) by mouth 2 (two) times daily.   ? doxazosin (CARDURA) 4 MG tablet TAKE 1 TABLET BY MOUTH AT BEDTIME   ? fluticasone (FLONASE) 50 MCG/ACT nasal spray Place 2 sprays into both nostrils 2 (two) times daily.    ? ipratropium (ATROVENT) 0.02 % nebulizer solution Take  0.5 mg by nebulization every 6 (six) hours as needed for wheezing or shortness of breath.   ? isosorbide mononitrate (IMDUR) 30 MG 24 hr tablet Take 30 mg by mouth daily.   ? levothyroxine (SYNTHROID) 88 MCG tablet Take 1 tablet (88 mcg total) by mouth daily before breakfast.   ? loratadine (CLARITIN) 10 MG tablet Take 10 mg by mouth daily.  08/24/2018: Takes liqui-gels at home  ? multivitamin (ONE-A-DAY MEN'S) TABS tablet Take 1 tablet by mouth daily.   ? nitroGLYCERIN (NITROSTAT) 0.4 MG SL tablet Place 1 tablet (0.4 mg total) under the tongue every 5 (five) minutes as needed for chest pain.   ? potassium chloride (KLOR-CON) 10 MEQ tablet TAKE TWO TABLETS EVERY OTHER DAY AND 1 TABLET ON THE DAYS IN BETWEEN   ? traZODone (DESYREL) 50 MG tablet TAKE ONE TABLET AT BEDTIME AS NEEDED FORSLEEP   ? [DISCONTINUED] allopurinol (ZYLOPRIM) 100 MG tablet Take 100 mg by mouth daily.   ? [DISCONTINUED] insulin degludec (TRESIBA) 200 UNIT/ML FlexTouch Pen 120 Units. Given by endo   ? [DISCONTINUED] oxyCODONE (OXY IR/ROXICODONE) 5 MG immediate release tablet Take 1 tablet (5 mg total) by mouth every 6 (six) hours as needed for severe pain.   ? allopurinol (ZYLOPRIM) 100 MG tablet Take 1 tablet (100 mg total) by mouth daily.   ? amiodarone (PACERONE) 200 MG tablet Take 1 tablet (200 mg total) by mouth daily for  30 days.   ? insulin degludec (TRESIBA) 200 UNIT/ML FlexTouch Pen Inject 120 Units into the skin daily before breakfast.   ? oxyCODONE (OXY IR/ROXICODONE) 5 MG immediate release tablet Take 1 tablet (5 mg total) by mouth every 6 (six) hours as needed for severe pain.   ? [DISCONTINUED] benzonatate (TESSALON) 200 MG capsule Take 1 capsule (200 mg total) by mouth 2 (two) times daily as needed for cough. (Patient not taking: Reported on 11/10/2021)   ? [DISCONTINUED] Budeson-Glycopyrrol-Formoterol (BREZTRI AEROSPHERE) 160-9-4.8 MCG/ACT AERO Inhale 2 puffs into the lungs 2 (two) times daily. (Patient not taking: Reported on  11/10/2021)   ? [DISCONTINUED] diltiazem (TIAZAC) 360 MG 24 hr capsule Take 1 capsule (360 mg total) by mouth daily. (Patient not taking: Reported on 11/10/2021)   ? [DISCONTINUED] febuxostat (ULORIC) 40 MG tablet TAKE ONE TABLET EVERY DAY (Patient not taking: Reported on 11/10/2021)   ? [DISCONTINUED] glipiZIDE (GLUCOTROL XL) 10 MG 24 hr tablet Take 1 tablet (10 mg total) by mouth daily. (Patient not taking: Reported on 11/10/2021)   ? [DISCONTINUED] hydrALAZINE (APRESOLINE) 25 MG tablet Take 25 mg by mouth 3 (three) times daily. (Patient not taking: Reported on 11/10/2021)   ? [DISCONTINUED] metolazone (ZAROXOLYN) 5 MG tablet TAKE 1 TABLET BY MOUTH DAILY FOR SWELLING (Patient not taking: Reported on 11/10/2021)   ? [DISCONTINUED] predniSONE (DELTASONE) 10 MG tablet Take one tab 3 x day for 3 days, then take one tab 2 x a day for 3 days and then take one tab a day for 3 days for copd (Patient not taking: Reported on 11/10/2021)   ? ?No facility-administered encounter medications on file as of 11/10/2021.  ? ? ?Surgical History: ?Past Surgical History:  ?Procedure Laterality Date  ? COLONOSCOPY WITH PROPOFOL N/A 11/07/2017  ? Procedure: COLONOSCOPY WITH PROPOFOL;  Surgeon: [name], MD;  Location: [facility] ENDOSCOPY;  Service: Gastroenterology;  Laterality: N/A;  ? ESOPHAGOGASTRODUODENOSCOPY (EGD) WITH PROPOFOL N/A 11/07/2017  ? Procedure: ESOPHAGOGASTRODUODENOSCOPY (EGD) WITH PROPOFOL;  Surgeon: [name], MD;  Location: [facility] ENDOSCOPY;  Service: Gastroenterology;  Laterality: N/A;  ? LEFT HEART CATH AND CORONARY ANGIOGRAPHY N/A 10/23/2017  ? Procedure: LEFT HEART CATH AND CORONARY ANGIOGRAPHY;  Surgeon: Isaias Cowman, MD;  Location: [place] [facility];  Service: Cardiovascular;  Laterality: N/A;  ? ? ?Medical History: ?Past Medical History:  ?Diagnosis Date  ? CHF (congestive heart failure) ([place])   ? COPD (chronic obstructive pulmonary disease) ([place])   ? Diabetes mellitus without complication ([place])   ? Edema   ? leg  swelling  ? Gastroesophageal reflux disease 12/15/2017  ? Gout   ? Hypertension   ? Melena 11/05/2017  ? Pneumonia 07/27/2016  ? ? ?Family History: ?Family History  ?Problem Relation Age of Onset  ? Cancer Mother   ? CVA Father   ? Heart disease Father   ? ? ?Social History  ? ?Socioeconomic History  ? Marital status: Single  ?  Spouse name: Not on file  ? Number of children: Not on file  ? Years of education: Not on file  ? Highest education level: Not on file  ?Occupational History  ? Not on file  ?Tobacco Use  ? Smoking status: Never  ? Smokeless tobacco: Never  ?Substance and Sexual Activity  ? Alcohol use: Not Currently  ?  Comment: ocassionally  ? Drug use: Never  ? Sexual activity: Not on file  ?Other Topics Concern  ? Not on file  ?Social History Narrative  ? Not on  file  ? ?Social Determinants of Health  ? ?Financial Resource Strain: Not on file  ?Food Insecurity: Not on file  ?Transportation Needs: Not on file  ?Physical Activity: Not on file  ?Stress: Not on file  ?Social Connections: Not on file  ?Intimate Partner Violence: Not on file  ? ? ? ? ?Review of Systems  ?Constitutional:  Positive for fatigue. Negative for chills and fever.  ?HENT:  Positive for congestion, ear pain, postnasal drip, rhinorrhea, sinus pressure, sinus pain and sore throat.   ?Respiratory:  Positive for cough, chest tightness, shortness of breath and wheezing.   ?Cardiovascular: Negative.  Negative for chest pain and palpitations.  ?Gastrointestinal: Negative.  Negative for abdominal pain, constipation, diarrhea, nausea and vomiting.  ?Musculoskeletal:  Positive for arthralgias. Negative for myalgias.  ?Skin: Negative.  Negative for rash.  ?Neurological:  Positive for headaches.  ? ?Vital Signs: ?BP 133/60   Pulse 70   Temp 98.6 ?F (37 ?C)   Resp 16   Ht '5\' 8"'$  (1.727 m)   Wt (!) 324 lb 9.6 oz (147.2 kg)   SpO2 97%   BMI 49.36 kg/m?  ? ? ?Physical Exam ?Constitutional:   ?   General: He is awake. He is not in acute distress. ?    Appearance: Normal appearance. He is well-developed and well-groomed. He is morbidly obese. He is not ill-appearing.  ?HENT:  ?   Head: Normocephalic and atraumatic.  ?   Right Ear: Tympanic membrane, ear canal

## 2021-11-17 ENCOUNTER — Telehealth: Payer: Self-pay

## 2021-11-17 DIAGNOSIS — I10 Essential (primary) hypertension: Secondary | ICD-10-CM | POA: Diagnosis not present

## 2021-11-17 DIAGNOSIS — E1122 Type 2 diabetes mellitus with diabetic chronic kidney disease: Secondary | ICD-10-CM | POA: Diagnosis not present

## 2021-11-17 DIAGNOSIS — N184 Chronic kidney disease, stage 4 (severe): Secondary | ICD-10-CM | POA: Diagnosis not present

## 2021-11-17 DIAGNOSIS — D631 Anemia in chronic kidney disease: Secondary | ICD-10-CM | POA: Diagnosis not present

## 2021-11-17 DIAGNOSIS — N2581 Secondary hyperparathyroidism of renal origin: Secondary | ICD-10-CM | POA: Diagnosis not present

## 2021-11-17 NOTE — Telephone Encounter (Signed)
Send message to courtney that pt need appt for pulmonary for Oxygen recertification pt ned ONO order at room air at next visit as per [facility] ?

## 2021-11-18 ENCOUNTER — Ambulatory Visit (INDEPENDENT_AMBULATORY_CARE_PROVIDER_SITE_OTHER): Payer: HMO | Admitting: Physician Assistant

## 2021-11-18 ENCOUNTER — Encounter: Payer: Self-pay | Admitting: Physician Assistant

## 2021-11-18 ENCOUNTER — Telehealth: Payer: Self-pay

## 2021-11-18 DIAGNOSIS — J0100 Acute maxillary sinusitis, unspecified: Secondary | ICD-10-CM | POA: Diagnosis not present

## 2021-11-18 DIAGNOSIS — G4733 Obstructive sleep apnea (adult) (pediatric): Secondary | ICD-10-CM | POA: Diagnosis not present

## 2021-11-18 DIAGNOSIS — R0602 Shortness of breath: Secondary | ICD-10-CM | POA: Diagnosis not present

## 2021-11-18 DIAGNOSIS — J449 Chronic obstructive pulmonary disease, unspecified: Secondary | ICD-10-CM

## 2021-11-18 DIAGNOSIS — G4734 Idiopathic sleep related nonobstructive alveolar hypoventilation: Secondary | ICD-10-CM | POA: Diagnosis not present

## 2021-11-18 MED ORDER — AMOXICILLIN-POT CLAVULANATE 875-125 MG PO TABS: 1.0000 | ORAL_TABLET | Freq: Two times a day (BID) | ORAL | 0 refills | Status: DC

## 2021-11-18 MED ORDER — ALBUTEROL SULFATE HFA 108 (90 BASE) MCG/ACT IN AERS: 1.0000 | INHALATION_SPRAY | Freq: Four times a day (QID) | RESPIRATORY_TRACT | 5 refills | Status: DC | PRN

## 2021-11-18 MED ORDER — IPRATROPIUM BROMIDE 0.02 % IN SOLN: 0.5000 mg | Freq: Four times a day (QID) | RESPIRATORY_TRACT | 1 refills | Status: DC | PRN

## 2021-11-18 NOTE — Progress Notes (Signed)
[facility] ?[address] ?[place], [place] [address] ? ?Pulmonary Sleep Medicine  ? ?Office Visit Note ? ?Patient Name: Jason Hudson ?DOB: 02-26-1951 ?MRN [id number] ? ?Date of Service: 11/24/2021 ? ?Complaints/HPI: Pt is here for routine pulmonary follow up. He is still using his old cpap machine because he was told he wasn't eligible for new machine last year. States he is still receiving a bill for it but they picked up the machine after a few days (08/23/20). Will need to look into this with DME company. He also states his oxygen machine is very loud and disrupts his sleep. He therefore doesn't want to wear the oxygen if it is going to be loud. He is here for order for repeat ONO on CPAP to determine continued need for nocturnal oxygen and may have company check noise from machine if still indicated. He is also still having sinus congestion  despite completing 5 day zpak.  ? ? ?ROS ? ?General: (-) fever, (-) chills, (-) night sweats, (-) weakness ?Skin: (-) rashes, (-) itching,. ?Eyes: (-) visual changes, (-) redness, (-) itching. ?Nose and Sinuses: (-) nasal stuffiness or itchiness, (-) postnasal drip, (-) nosebleeds, (-) sinus trouble. ?Mouth and Throat: (-) sore throat, (-) hoarseness. ?Neck: (-) swollen glands, (-) enlarged thyroid, (-) neck pain. ?Respiratory: - cough, (-) bloody sputum, - shortness of breath, - wheezing. ?Cardiovascular: - ankle swelling, (-) chest pain. ?Lymphatic: (-) lymph node enlargement. ?Neurologic: (-) numbness, (-) tingling. ?Psychiatric: (-) anxiety, (-) depression ? ? ?Current Medication: ?Outpatient Encounter Medications as of 11/18/2021  ?Medication Sig Note  ? allopurinol (ZYLOPRIM) 100 MG tablet Take 1 tablet (100 mg total) by mouth daily.   ? amoxicillin-clavulanate (AUGMENTIN) 875-125 MG tablet Take 1 tablet by mouth 2 (two) times daily. Take with food.   ? aspirin EC 81 MG tablet Take 81 mg by mouth daily. Swallow whole.   ? carvedilol (COREG) 6.25 MG  tablet Take 6.25 mg by mouth 2 (two) times daily with a meal. Pt gets from cardiologist   ? Cholecalciferol (VITAMIN D3) 50 MCG (2000 UT) TABS Take 2,000 Units by mouth at bedtime.   ? colchicine 0.6 MG tablet Take 1 tablet (0.6 mg total) by mouth 2 (two) times daily.   ? doxazosin (CARDURA) 4 MG tablet TAKE 1 TABLET BY MOUTH AT BEDTIME   ? fluticasone (FLONASE) 50 MCG/ACT nasal spray Place 2 sprays into both nostrils 2 (two) times daily.    ? insulin degludec (TRESIBA) 200 UNIT/ML FlexTouch Pen Inject 120 Units into the skin daily before breakfast.   ? isosorbide mononitrate (IMDUR) 30 MG 24 hr tablet Take 30 mg by mouth daily.   ? levothyroxine (SYNTHROID) 88 MCG tablet Take 1 tablet (88 mcg total) by mouth daily before breakfast.   ? loratadine (CLARITIN) 10 MG tablet Take 10 mg by mouth daily.  08/24/2018: Takes liqui-gels at home  ? multivitamin (ONE-A-DAY MEN'S) TABS tablet Take 1 tablet by mouth daily.   ? nitroGLYCERIN (NITROSTAT) 0.4 MG SL tablet Place 1 tablet (0.4 mg total) under the tongue every 5 (five) minutes as needed for chest pain.   ? oxyCODONE (OXY IR/ROXICODONE) 5 MG immediate release tablet Take 1 tablet (5 mg total) by mouth every 6 (six) hours as needed for severe pain.   ? potassium chloride (KLOR-CON) 10 MEQ tablet TAKE TWO TABLETS EVERY OTHER DAY AND 1 TABLET ON THE DAYS IN BETWEEN   ? traZODone (DESYREL) 50 MG tablet TAKE ONE TABLET AT BEDTIME  AS NEEDED FORSLEEP   ? [DISCONTINUED] albuterol (VENTOLIN HFA) 108 (90 Base) MCG/ACT inhaler Inhale 1-2 puffs into the lungs every 6 (six) hours as needed for wheezing or shortness of breath.   ? [DISCONTINUED] ipratropium (ATROVENT) 0.02 % nebulizer solution Take 0.5 mg by nebulization every 6 (six) hours as needed for wheezing or shortness of breath.   ? albuterol (VENTOLIN HFA) 108 (90 Base) MCG/ACT inhaler Inhale 1-2 puffs into the lungs every 6 (six) hours as needed for wheezing or shortness of breath.   ? amiodarone (PACERONE) 200 MG tablet Take  1 tablet (200 mg total) by mouth daily for 30 days.   ? ipratropium (ATROVENT) 0.02 % nebulizer solution Take 2.5 mLs (0.5 mg total) by nebulization every 6 (six) hours as needed for wheezing or shortness of breath.   ? ?No facility-administered encounter medications on file as of 11/18/2021.  ? ? ?Surgical History: ?Past Surgical History:  ?Procedure Laterality Date  ? COLONOSCOPY WITH PROPOFOL N/A 11/07/2017  ? Procedure: COLONOSCOPY WITH PROPOFOL;  Surgeon: [name], MD;  Location: [facility] ENDOSCOPY;  Service: Gastroenterology;  Laterality: N/A;  ? ESOPHAGOGASTRODUODENOSCOPY (EGD) WITH PROPOFOL N/A 11/07/2017  ? Procedure: ESOPHAGOGASTRODUODENOSCOPY (EGD) WITH PROPOFOL;  Surgeon: [name], MD;  Location: [facility] ENDOSCOPY;  Service: Gastroenterology;  Laterality: N/A;  ? LEFT HEART CATH AND CORONARY ANGIOGRAPHY N/A 10/23/2017  ? Procedure: LEFT HEART CATH AND CORONARY ANGIOGRAPHY;  Surgeon: Isaias Cowman, MD;  Location: [facility] CV LAB;  Service: Cardiovascular;  Laterality: N/A;  ? ? ?Medical History: ?Past Medical History:  ?Diagnosis Date  ? CHF (congestive heart failure) ([place])   ? COPD (chronic obstructive pulmonary disease) ([place])   ? Diabetes mellitus without complication ([place])   ? Edema   ? leg swelling  ? Gastroesophageal reflux disease 12/15/2017  ? Gout   ? Hypertension   ? Melena 11/05/2017  ? Pneumonia 07/27/2016  ? ? ?Family History: ?Family History  ?Problem Relation Age of Onset  ? Cancer Mother   ? CVA Father   ? Heart disease Father   ? ? ?Social History: ?Social History  ? ?Socioeconomic History  ? Marital status: Single  ?  Spouse name: Not on file  ? Number of children: Not on file  ? Years of education: Not on file  ? Highest education level: Not on file  ?Occupational History  ? Not on file  ?Tobacco Use  ? Smoking status: Never  ? Smokeless tobacco: Never  ?Substance and Sexual Activity  ? Alcohol use: Not Currently  ?  Comment: ocassionally  ? Drug use: Never  ? Sexual activity: Not on  file  ?Other Topics Concern  ? Not on file  ?Social History Narrative  ? Not on file  ? ?Social Determinants of Health  ? ?Financial Resource Strain: Not on file  ?Food Insecurity: Not on file  ?Transportation Needs: Not on file  ?Physical Activity: Not on file  ?Stress: Not on file  ?Social Connections: Not on file  ?Intimate Partner Violence: Not on file  ? ? ?Vital Signs: ?Blood pressure 130/78, pulse 73, temperature 98.6 ?F (37 ?C), resp. rate 16, height '5\' 8"'$  (1.727 m), weight (!) 325 lb (147.4 kg), SpO2 98 %. ? ?Examination: ?General Appearance: The patient is well-developed, well-nourished, and in no distress. ?Skin: Gross inspection of skin unremarkable. ?Head: normocephalic, no gross deformities. ?Eyes: no gross deformities noted. ?ENT: ears appear grossly normal no exudates. ?Neck: Supple. No thyromegaly. No LAD. ?Respiratory: Lungs clear to auscultation bilaterally. ?Cardiovascular: Normal  S1 and S2 without murmur or rub. ?Extremities: No cyanosis. pulses are equal. ?Neurologic: Alert and oriented. No involuntary movements. ? ?LABS: ?Recent Results (from the past 2160 hour(s))  ?POCT HgB A1C     Status: Abnormal  ? Collection Time: 11/10/21  4:04 PM  ?Result Value Ref Range  ? Hemoglobin A1C 7.6 (A) 4.0 - 5.6 %  ? HbA1c POC (<> result, manual entry)    ? HbA1c, POC (prediabetic range)    ? HbA1c, POC (controlled diabetic range)    ? ? ?Radiology: ?US Venous Img Lower Bilateral (DVT) ? ?Result Date: 06/22/2021 ?CLINICAL DATA:  Bilateral lower extremity edema. EXAM: BILATERAL LOWER EXTREMITY VENOUS DOPPLER ULTRASOUND TECHNIQUE: Gray-scale sonography with graded compression, as well as color Doppler and duplex ultrasound were performed to evaluate the lower extremity deep venous systems from the level of the common femoral vein and including the common femoral, femoral, profunda femoral, popliteal and calf veins including the posterior tibial, peroneal and gastrocnemius veins when visible. The superficial  great saphenous vein was also interrogated. Spectral Doppler was utilized to evaluate flow at rest and with distal augmentation maneuvers in the common femoral, femoral and popliteal veins. COMPARISON:

## 2021-11-18 NOTE — Telephone Encounter (Signed)
Sent order to [facility] to order ONO on patient. ? ?Also sent message that pt is being charged for a CPAP machine that he had for only a few days and it was picked back up.  Payment is drafted out of his check monthly ?

## 2021-11-24 NOTE — Patient Instructions (Signed)

## 2021-11-26 ENCOUNTER — Other Ambulatory Visit: Payer: Self-pay | Admitting: Physician Assistant

## 2021-11-26 DIAGNOSIS — E1159 Type 2 diabetes mellitus with other circulatory complications: Secondary | ICD-10-CM

## 2021-11-29 ENCOUNTER — Telehealth: Payer: Self-pay

## 2021-11-29 ENCOUNTER — Other Ambulatory Visit: Payer: Self-pay

## 2021-11-29 DIAGNOSIS — J449 Chronic obstructive pulmonary disease, unspecified: Secondary | ICD-10-CM

## 2021-11-29 MED ORDER — IPRATROPIUM BROMIDE 0.02 % IN SOLN: 0.5000 mg | Freq: Four times a day (QID) | RESPIRATORY_TRACT | 1 refills | Status: AC | PRN

## 2021-11-29 NOTE — Telephone Encounter (Signed)
Pt called and advised that his insurance was not going to cover the Ipratropium 0.02% nebulizer salutation.  I called [facility] and spoke to [facility] and she advised that the insurance will cover  225 ML (3 boxes) every 30 days. And that patient needs to let 1 box last him 9-10 days at a time.  Pt understood ?

## 2021-11-29 NOTE — Telephone Encounter (Signed)
Pt called and advised that his insurance was not going to cover the Ipratropium 0.02% nebulizer salutation.  I called [facility] and spoke to [facility] and she advised that the insurance will cover  225 ML (3 boxes) every 30 days. And that patient needs to let 1 box last him  ?

## 2021-12-10 ENCOUNTER — Other Ambulatory Visit: Payer: Self-pay | Admitting: Internal Medicine

## 2021-12-10 DIAGNOSIS — E039 Hypothyroidism, unspecified: Secondary | ICD-10-CM

## 2021-12-13 ENCOUNTER — Telehealth: Payer: Self-pay

## 2021-12-13 DIAGNOSIS — M1A3310 Chronic gout due to renal impairment, right wrist, without tophus (tophi): Secondary | ICD-10-CM

## 2021-12-14 MED ORDER — OXYCODONE HCL 5 MG PO TABS: 5.0000 mg | ORAL_TABLET | Freq: Four times a day (QID) | ORAL | 0 refills | Status: DC | PRN

## 2021-12-14 NOTE — Telephone Encounter (Signed)
Spoke to pt, informed him med was sent  ?

## 2021-12-20 ENCOUNTER — Ambulatory Visit: Payer: HMO | Admitting: Nurse Practitioner

## 2021-12-20 DIAGNOSIS — I2510 Atherosclerotic heart disease of native coronary artery without angina pectoris: Secondary | ICD-10-CM | POA: Diagnosis not present

## 2021-12-20 DIAGNOSIS — I5032 Chronic diastolic (congestive) heart failure: Secondary | ICD-10-CM | POA: Diagnosis not present

## 2021-12-20 DIAGNOSIS — R600 Localized edema: Secondary | ICD-10-CM | POA: Diagnosis not present

## 2021-12-20 DIAGNOSIS — I480 Paroxysmal atrial fibrillation: Secondary | ICD-10-CM | POA: Diagnosis not present

## 2021-12-20 DIAGNOSIS — I509 Heart failure, unspecified: Secondary | ICD-10-CM | POA: Diagnosis not present

## 2021-12-29 DIAGNOSIS — N184 Chronic kidney disease, stage 4 (severe): Secondary | ICD-10-CM | POA: Diagnosis not present

## 2021-12-29 DIAGNOSIS — M5416 Radiculopathy, lumbar region: Secondary | ICD-10-CM | POA: Diagnosis not present

## 2021-12-29 DIAGNOSIS — Z794 Long term (current) use of insulin: Secondary | ICD-10-CM | POA: Diagnosis not present

## 2021-12-29 DIAGNOSIS — E1122 Type 2 diabetes mellitus with diabetic chronic kidney disease: Secondary | ICD-10-CM | POA: Diagnosis not present

## 2021-12-29 DIAGNOSIS — I5022 Chronic systolic (congestive) heart failure: Secondary | ICD-10-CM | POA: Diagnosis not present

## 2021-12-29 DIAGNOSIS — R600 Localized edema: Secondary | ICD-10-CM | POA: Diagnosis not present

## 2021-12-29 DIAGNOSIS — L600 Ingrowing nail: Secondary | ICD-10-CM | POA: Diagnosis not present

## 2022-01-05 ENCOUNTER — Encounter: Payer: Self-pay | Admitting: Nurse Practitioner

## 2022-01-05 ENCOUNTER — Ambulatory Visit (INDEPENDENT_AMBULATORY_CARE_PROVIDER_SITE_OTHER): Payer: HMO | Admitting: Nurse Practitioner

## 2022-01-05 VITALS — BP 123/52 | HR 79 | Temp 98.4°F | Resp 16 | Ht 68.0 in | Wt 320.0 lb

## 2022-01-05 DIAGNOSIS — I5032 Chronic diastolic (congestive) heart failure: Secondary | ICD-10-CM | POA: Diagnosis not present

## 2022-01-05 DIAGNOSIS — N184 Chronic kidney disease, stage 4 (severe): Secondary | ICD-10-CM

## 2022-01-05 DIAGNOSIS — E039 Hypothyroidism, unspecified: Secondary | ICD-10-CM | POA: Diagnosis not present

## 2022-01-05 DIAGNOSIS — R300 Dysuria: Secondary | ICD-10-CM

## 2022-01-05 DIAGNOSIS — Z0001 Encounter for general adult medical examination with abnormal findings: Secondary | ICD-10-CM | POA: Diagnosis not present

## 2022-01-05 DIAGNOSIS — D638 Anemia in other chronic diseases classified elsewhere: Secondary | ICD-10-CM

## 2022-01-05 DIAGNOSIS — M5416 Radiculopathy, lumbar region: Secondary | ICD-10-CM

## 2022-01-05 DIAGNOSIS — E559 Vitamin D deficiency, unspecified: Secondary | ICD-10-CM

## 2022-01-05 DIAGNOSIS — M1A3310 Chronic gout due to renal impairment, right wrist, without tophus (tophi): Secondary | ICD-10-CM | POA: Diagnosis not present

## 2022-01-05 DIAGNOSIS — I10 Essential (primary) hypertension: Secondary | ICD-10-CM

## 2022-01-05 DIAGNOSIS — M79642 Pain in left hand: Secondary | ICD-10-CM | POA: Diagnosis not present

## 2022-01-05 DIAGNOSIS — M7989 Other specified soft tissue disorders: Secondary | ICD-10-CM

## 2022-01-05 DIAGNOSIS — Z794 Long term (current) use of insulin: Secondary | ICD-10-CM

## 2022-01-05 DIAGNOSIS — E1122 Type 2 diabetes mellitus with diabetic chronic kidney disease: Secondary | ICD-10-CM | POA: Diagnosis not present

## 2022-01-05 DIAGNOSIS — Z125 Encounter for screening for malignant neoplasm of prostate: Secondary | ICD-10-CM

## 2022-01-05 MED ORDER — OXYCODONE HCL 5 MG PO TABS: 5.0000 mg | ORAL_TABLET | Freq: Four times a day (QID) | ORAL | 0 refills | Status: DC | PRN

## 2022-01-05 NOTE — Progress Notes (Signed)
[facility] [place], [place] [address]  Internal MEDICINE  Office Visit Note  Patient Name: Jason Hudson  [id number]  [id number]  Date of Service: 01/05/2022  Chief Complaint  Patient presents with   Medicare Wellness    Left hand pain started yesterday, feels worse today    Diabetes   Gastroesophageal Reflux   Hypertension   COPD    HPI Jason Hudson presents for an annual well visit and physical exam.  He is a well-appearing 71 year old male with heart failure, hypertension, COPD, sleep apnea, GERD, diabetes, low back pain, CKD stage IV, bilateral lower extremity edema and hyperlipidemia.  He is last A1c was 7.6 in March which was significantly improved from 8.5 in December last year.  He continues to take his medications as prescribed and has lost 5 pounds since his previous office visit.  He has lost a total of 13 pounds in the past 12 months.  His next A1c is not due until late June. --He had a routine colonoscopy in March 2019 and he is not due to repeat the colonoscopy until next year in March.  He is scheduled to have his diabetic eye exam in June and he is due for his diabetic foot exam today.  He is also due for prostate cancer screening with a PSA level. He has labs done in June with his nephrologist.   Current Medication: Outpatient Encounter Medications as of 01/05/2022  Medication Sig Note   albuterol (VENTOLIN HFA) 108 (90 Base) MCG/ACT inhaler Inhale 1-2 puffs into the lungs every 6 (six) hours as needed for wheezing or shortness of breath.    aspirin EC 81 MG tablet Take 81 mg by mouth daily. Swallow whole.    carvedilol (COREG) 6.25 MG tablet Take 6.25 mg by mouth 2 (two) times daily with a meal. Pt gets from cardiologist    Cholecalciferol (VITAMIN D3) 50 MCG (2000 UT) TABS Take 2,000 Units by mouth at bedtime.    doxazosin (CARDURA) 4 MG tablet TAKE 1 TABLET BY MOUTH AT BEDTIME    fluticasone (FLONASE) 50 MCG/ACT nasal spray Place 2  sprays into both nostrils 2 (two) times daily.     insulin degludec (TRESIBA) 200 UNIT/ML FlexTouch Pen Inject 120 Units into the skin daily before breakfast.    ipratropium (ATROVENT) 0.02 % nebulizer solution Take 2.5 mLs (0.5 mg total) by nebulization every 6 (six) hours as needed for wheezing or shortness of breath.    isosorbide mononitrate (IMDUR) 30 MG 24 hr tablet Take 30 mg by mouth daily.    levothyroxine (SYNTHROID) 88 MCG tablet TAKE ONE TABLET EACH MORNING BEFORE BREAKFAST    loratadine (CLARITIN) 10 MG tablet Take 10 mg by mouth daily.  08/24/2018: Takes liqui-gels at home   multivitamin (ONE-A-DAY MEN'S) TABS tablet Take 1 tablet by mouth daily.    nitroGLYCERIN (NITROSTAT) 0.4 MG SL tablet Place 1 tablet (0.4 mg total) under the tongue every 5 (five) minutes as needed for chest pain.    traZODone (DESYREL) 50 MG tablet TAKE ONE TABLET AT BEDTIME AS NEEDED FORSLEEP    [DISCONTINUED] allopurinol (ZYLOPRIM) 100 MG tablet Take 1 tablet (100 mg total) by mouth daily.    [DISCONTINUED] amoxicillin-clavulanate (AUGMENTIN) 875-125 MG tablet Take 1 tablet by mouth 2 (two) times daily. Take with food.    [DISCONTINUED] colchicine 0.6 MG tablet Take 1 tablet (0.6 mg total) by mouth 2 (two) times daily.    [DISCONTINUED] oxyCODONE (OXY IR/ROXICODONE) 5 MG immediate release  tablet Take 1 tablet (5 mg total) by mouth every 6 (six) hours as needed for severe pain.    [DISCONTINUED] potassium chloride (KLOR-CON) 10 MEQ tablet TAKE TWO TABLETS EVERY OTHER DAY AND 1 TABLET ON THE DAYS IN BETWEEN    amiodarone (PACERONE) 200 MG tablet Take 1 tablet (200 mg total) by mouth daily for 30 days.    [DISCONTINUED] oxyCODONE (OXY IR/ROXICODONE) 5 MG immediate release tablet Take 1 tablet (5 mg total) by mouth every 6 (six) hours as needed for severe pain.    No facility-administered encounter medications on file as of 01/05/2022.    Surgical History: Past Surgical History:  Procedure Laterality Date    COLONOSCOPY WITH PROPOFOL N/A 11/07/2017   Procedure: COLONOSCOPY WITH PROPOFOL;  Surgeon: [name], MD;  Location: [facility] ENDOSCOPY;  Service: Gastroenterology;  Laterality: N/A;   ESOPHAGOGASTRODUODENOSCOPY (EGD) WITH PROPOFOL N/A 11/07/2017   Procedure: ESOPHAGOGASTRODUODENOSCOPY (EGD) WITH PROPOFOL;  Surgeon: [name], MD;  Location: [facility] ENDOSCOPY;  Service: Gastroenterology;  Laterality: N/A;   LEFT HEART CATH AND CORONARY ANGIOGRAPHY N/A 10/23/2017   Procedure: LEFT HEART CATH AND CORONARY ANGIOGRAPHY;  Surgeon: Isaias Cowman, MD;  Location: [facility];  Service: Cardiovascular;  Laterality: N/A;    Medical History: Past Medical History:  Diagnosis Date   CHF (congestive heart failure) (HCC)    COPD (chronic obstructive pulmonary disease) (HCC)    Diabetes mellitus without complication (HCC)    Edema    leg swelling   Gastroesophageal reflux disease 12/15/2017   Gout    Hypertension    Melena 11/05/2017   Pneumonia 07/27/2016    Family History: Family History  Problem Relation Age of Onset   Cancer Mother    CVA Father    Heart disease Father     Social History   Socioeconomic History   Marital status: Single    Spouse name: Not on file   Number of children: Not on file   Years of education: Not on file   Highest education level: Not on file  Occupational History   Not on file  Tobacco Use   Smoking status: Never   Smokeless tobacco: Never  Substance and Sexual Activity   Alcohol use: Not Currently    Comment: ocassionally   Drug use: Never   Sexual activity: Not on file  Other Topics Concern   Not on file  Social History Narrative   Not on file   Social Determinants of Health   Financial Resource Strain: Not on file  Food Insecurity: Not on file  Transportation Needs: Not on file  Physical Activity: Not on file  Stress: Not on file  Social Connections: Not on file  Intimate Partner Violence: Not on file      Review of Systems   Constitutional:  Negative for activity change, appetite change, chills, fatigue, fever and unexpected weight change.  HENT: Negative.  Negative for congestion, ear pain, rhinorrhea, sore throat and trouble swallowing.   Eyes: Negative.   Respiratory:  Positive for shortness of breath (intermittent). Negative for cough, chest tightness and wheezing.   Cardiovascular:  Positive for leg swelling (chronic bilateral lower extremity edema). Negative for chest pain and palpitations.  Gastrointestinal: Negative.  Negative for abdominal pain, blood in stool, constipation, diarrhea, nausea and vomiting.  Endocrine: Negative.   Genitourinary: Negative.  Negative for difficulty urinating, dysuria, frequency, hematuria and urgency.  Musculoskeletal: Negative.  Negative for arthralgias, back pain, joint swelling, myalgias and neck pain.  Skin: Negative.  Negative for rash and wound.  Allergic/Immunologic: Negative.  Negative for immunocompromised state.  Neurological: Negative.  Negative for dizziness, seizures, numbness and headaches.  Hematological: Negative.   Psychiatric/Behavioral: Negative.  Negative for behavioral problems, self-injury and suicidal ideas. The patient is not nervous/anxious.     Vital Signs: BP (!) 123/52   Pulse 79   Temp 98.4 F (36.9 C)   Resp 16   Ht '5\' 8"'$  (1.727 m)   Wt (!) 320 lb (145.2 kg)   SpO2 93%   BMI 48.66 kg/m    Physical Exam Vitals reviewed.  Constitutional:      General: He is awake. He is not in acute distress.    Appearance: Normal appearance. He is well-developed and well-groomed. He is morbidly obese. He is not ill-appearing or diaphoretic.  HENT:     Head: Normocephalic and atraumatic.     Right Ear: Tympanic membrane, ear canal and external ear normal.     Left Ear: Tympanic membrane, ear canal and external ear normal.     Nose: Nose normal. No congestion or rhinorrhea.     Mouth/Throat:     Lips: Pink.     Mouth: Mucous membranes are moist.      Pharynx: Oropharynx is clear. Uvula midline. No oropharyngeal exudate or posterior oropharyngeal erythema.  Eyes:     General: Lids are normal. Vision grossly intact. Gaze aligned appropriately. No scleral icterus.       Right eye: No discharge.        Left eye: No discharge.     Extraocular Movements: Extraocular movements intact.     Conjunctiva/sclera: Conjunctivae normal.     Pupils: Pupils are equal, round, and reactive to light.  Neck:     Thyroid: No thyromegaly.     Vascular: No JVD.     Trachea: Trachea and phonation normal. No tracheal deviation.  Cardiovascular:     Rate and Rhythm: Normal rate and regular rhythm.     Pulses:          Dorsalis pedis pulses are 2+ on the right side and 2+ on the left side.       Posterior tibial pulses are 1+ on the right side and 1+ on the left side.     Heart sounds: Normal heart sounds, S1 normal and S2 normal. No murmur heard.    No friction rub. No gallop.  Pulmonary:     Effort: Pulmonary effort is normal. No accessory muscle usage or respiratory distress.     Breath sounds: Normal breath sounds and air entry. No stridor. No decreased breath sounds, wheezing or rales.  Chest:     Chest wall: No tenderness.  Abdominal:     General: Bowel sounds are normal. There is no distension.     Palpations: Abdomen is soft. There is no shifting dullness, fluid wave, mass or pulsatile mass.     Tenderness: There is no abdominal tenderness. There is no guarding or rebound.  Musculoskeletal:        General: Swelling (slight swelling of the left hand) and tenderness (left hand) present. No deformity. Normal range of motion.     Cervical back: Normal range of motion and neck supple.     Right lower leg: 1+ Pitting Edema present.     Left lower leg: 1+ Pitting Edema present.     Right foot: Normal range of motion. No deformity, bunion, Charcot foot, foot drop or prominent metatarsal heads.     Left foot:  Normal range of motion. No deformity,  bunion, Charcot foot, foot drop or prominent metatarsal heads.  Feet:     Right foot:     Protective Sensation: 6 sites tested.  4 sites sensed.     Skin integrity: Callus and dry skin present. No ulcer, blister, skin breakdown, erythema, warmth or fissure.     Toenail Condition: Right toenails are abnormally thick and long.     Left foot:     Protective Sensation: 6 sites tested.  4 sites sensed.     Skin integrity: Callus and dry skin present. No ulcer, blister, skin breakdown, erythema, warmth or fissure.     Toenail Condition: Left toenails are abnormally thick and long.  Lymphadenopathy:     Cervical: No cervical adenopathy.  Skin:    General: Skin is warm and dry.     Coloration: Skin is not pale.     Findings: No erythema or rash.  Neurological:     Mental Status: He is alert and oriented to person, place, and time.     Cranial Nerves: No cranial nerve deficit.     Motor: No abnormal muscle tone.     Coordination: Coordination normal.     Gait: Gait normal.     Deep Tendon Reflexes: Reflexes are normal and symmetric.  Psychiatric:        Mood and Affect: Mood normal.        Behavior: Behavior normal. Behavior is cooperative.        Thought Content: Thought content normal.        Judgment: Judgment normal.        Assessment/Plan: 1. Encounter for routine adult health examination with abnormal findings Age-appropriate preventive screenings and vaccinations discussed, annual physical exam completed. Routine labs for health maintenance ordered.  Several of his labs are routinely done every 3 months by his nephrologist but there are a few other labs that need to be repeated and followed up on, see below. PHM updated.  His diabetic foot exam was done today.  He is due for a diabetic eye exam which will be done next month in June.  He is not due for a routine colonoscopy until next year and his PSA level has been ordered today for prostate cancer screening. - TSH + free T4 - B12  and Folate Panel - Vitamin D (25 hydroxy) - Lipid Profile  - Hepatic function panel  2. Type 2 diabetes mellitus with stage 4 chronic kidney disease, with long-term current use of insulin ([place]) His last A1c in March was continuing to improve, he will return for an office visit in late June to repeat his A1c again and additional routine labs have been ordered for a cholesterol panel and hepatic function panel.  Continue current medications, no changes. - Lipid Profile - Hepatic function panel  3. Chronic diastolic congestive heart failure (HCC) Current medications include isosorbide, doxazosin, carvedilol, amiodarone.  He is followed by cardiology and his most recent office visit was May 1 of this year and he is scheduled to see his cardiologist again in September.  His cardiovascular status is stable at this time - Lipid Profile - Hepatic function panel  4. 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 Anemia of chronic disease Patient has chronic anemia, his CBC is done approximately every 3 months by his nephrologist, additional  routine - B12 and Folate Panel - Hepatic function panel  6. Acquired hypothyroidism His thyroid levels have not been checked in over a year and his last TSH was 16 so his levels need to be repeated 6 to make sure that his current dose of levothyroxine is adequate. - TSH + free T4  7. Essential hypertension Blood pressure is well controlled with current medications, hepatic function panel ordered.  His nephrologist orders a basic metabolic panel so those labs are up-to-date but he has not had a hepatic function panel and quite some time. - Hepatic function panel  8. Hand pain, left X-ray of left hand ordered to rule out any fracture, dislocation, or other skeletal abnormality - DG Hand Complete Left;  Future  9. Swelling of left hand X-ray of left hand ordered to rule out any fracture, dislocation or other skeletal abnormality - DG Hand Complete Left; Future  10. Lumbar radiculopathy Chronic pain, patient takes oxycodone 5 mg as needed and he tries not to use it unless the pain is really severe  11. Vitamin D deficiency Routine lab ordered - Vitamin D (25 hydroxy)  12. Dysuria Routine urinalysis done - UA/M w/rflx Culture, Routine  13. Screening for prostate cancer PSA level ordered - PSA Total (Reflex To Free)      General Counseling: Malva Cogan understanding of the findings of todays visit and agrees with plan of treatment. I have discussed any further diagnostic evaluation that may be needed or ordered today. We also reviewed his medications today. he has been encouraged to call the office with any questions or concerns that should arise related to todays visit.    Orders Placed This Encounter  Procedures   Microscopic Examination   DG Hand Complete Left   UA/M w/rflx Culture, Routine   PSA Total (Reflex To Free)   TSH + free T4   B12 and Folate Panel   Vitamin D (25 hydroxy)   Lipid Profile   Hepatic function panel    Meds ordered this encounter  Medications   DISCONTD: oxyCODONE (OXY IR/ROXICODONE) 5 MG immediate release tablet    Sig: Take 1 tablet (5 mg total) by mouth every 6 (six) hours as needed for severe pain.    Dispense:  60 tablet    Refill:  0    For future refill    Return in about 5 weeks (around 02/09/2022) for F/U, Recheck A1C, Humphrey Guerreiro PCP.   Total time spent:30 Minutes Time spent includes review of chart, medications, test results, and follow up plan with the patient.   [place] Controlled Substance Database was reviewed by me.  This patient was seen by Jonetta Osgood, FNP-C in collaboration with Dr. Clayborn Bigness as a part of collaborative care agreement.  Joseantonio Dittmar R. Valetta Fuller, MSN, FNP-C Internal medicine

## 2022-01-06 ENCOUNTER — Ambulatory Visit
Admission: RE | Admit: 2022-01-06 | Discharge: 2022-01-06 | Disposition: A | Discharge status: Home / Self Care | Payer: HMO | Source: Ambulatory Visit | Attending: Nurse Practitioner | Admitting: Nurse Practitioner

## 2022-01-06 ENCOUNTER — Ambulatory Visit
Admission: RE | Admit: 2022-01-06 | Discharge: 2022-01-06 | Disposition: A | Discharge status: Home / Self Care | Payer: HMO | Attending: Nurse Practitioner | Admitting: Nurse Practitioner

## 2022-01-06 DIAGNOSIS — M79642 Pain in left hand: Secondary | ICD-10-CM | POA: Diagnosis not present

## 2022-01-06 DIAGNOSIS — M7989 Other specified soft tissue disorders: Secondary | ICD-10-CM | POA: Diagnosis not present

## 2022-01-06 DIAGNOSIS — Z125 Encounter for screening for malignant neoplasm of prostate: Secondary | ICD-10-CM | POA: Diagnosis not present

## 2022-01-06 LAB — UA/M W/RFLX CULTURE, ROUTINE
Bilirubin, UA: NEGATIVE
Glucose, UA: NEGATIVE
Ketones, UA: NEGATIVE
Leukocytes,UA: NEGATIVE
Nitrite, UA: NEGATIVE
Protein,UA: NEGATIVE
RBC, UA: NEGATIVE
Specific Gravity, UA: 1.009 (ref 1.005–1.030)
Urobilinogen, Ur: 0.2 mg/dL (ref 0.2–1.0)
pH, UA: 8 — ABNORMAL HIGH (ref 5.0–7.5)

## 2022-01-06 LAB — MICROSCOPIC EXAMINATION
Bacteria, UA: NONE SEEN
Casts: NONE SEEN /lpf
Epithelial Cells (non renal): NONE SEEN /hpf (ref 0–10)
WBC, UA: NONE SEEN /hpf (ref 0–5)

## 2022-01-07 LAB — PSA TOTAL (REFLEX TO FREE): Prostate Specific Ag, Serum: 0.2 ng/mL (ref 0.0–4.0)

## 2022-01-08 ENCOUNTER — Other Ambulatory Visit: Payer: Self-pay | Admitting: Nurse Practitioner

## 2022-01-08 DIAGNOSIS — M10331 Gout due to renal impairment, right wrist: Secondary | ICD-10-CM

## 2022-01-08 MED ORDER — COLCHICINE 0.6 MG PO TABS: ORAL_TABLET | ORAL | 3 refills | Status: DC

## 2022-01-08 NOTE — Progress Notes (Signed)
Please call patient and let him know that he does not have an acute fracture of his left hand.  There are multiple advanced degenerative changes involving the thumb, second finger, third finger and fourth finger joints as well as a suspected joint effusion of the third metacarpal joint (the middle finger).  X-ray findings are suggestive of gouty arthropathy.  There is also an old healed nonunited fracture of the distal portion of the thumb.  I have sent a refill of colchicine with specific instructions on how to take the medication to treat a gout flare, please follow instructions on the prescription when you pick it up. --His PSA level is normal

## 2022-01-10 ENCOUNTER — Telehealth: Payer: Self-pay

## 2022-01-10 DIAGNOSIS — E1165 Type 2 diabetes mellitus with hyperglycemia: Secondary | ICD-10-CM | POA: Diagnosis not present

## 2022-01-10 DIAGNOSIS — E1151 Type 2 diabetes mellitus with diabetic peripheral angiopathy without gangrene: Secondary | ICD-10-CM | POA: Diagnosis not present

## 2022-01-10 DIAGNOSIS — I480 Paroxysmal atrial fibrillation: Secondary | ICD-10-CM | POA: Diagnosis not present

## 2022-01-10 DIAGNOSIS — M109 Gout, unspecified: Secondary | ICD-10-CM | POA: Diagnosis not present

## 2022-01-10 NOTE — Telephone Encounter (Signed)
Spoke to pt, provided results  

## 2022-01-10 NOTE — Telephone Encounter (Signed)
-----   Message from Jonetta Osgood, NP sent at 01/08/2022 11:16 AM EDT ----- Please call patient and let him know that he does not have an acute fracture of his left hand.  There are multiple advanced degenerative changes involving the thumb, second finger, third finger and fourth finger joints as well as a suspected joint effusion of the third metacarpal joint (the middle finger).  X-ray findings are suggestive of gouty arthropathy.  There is also an old healed nonunited fracture of the distal portion of the thumb.  I have sent a refill of colchicine with specific instructions on how to take the medication to treat a gout flare, please follow instructions on the prescription when you pick it up. --His PSA level is normal

## 2022-01-12 ENCOUNTER — Other Ambulatory Visit: Payer: Self-pay | Admitting: Nurse Practitioner

## 2022-01-12 DIAGNOSIS — M1A3310 Chronic gout due to renal impairment, right wrist, without tophus (tophi): Secondary | ICD-10-CM

## 2022-01-13 ENCOUNTER — Other Ambulatory Visit: Payer: Self-pay | Admitting: Nurse Practitioner

## 2022-01-18 ENCOUNTER — Ambulatory Visit (INDEPENDENT_AMBULATORY_CARE_PROVIDER_SITE_OTHER): Payer: HMO | Admitting: Internal Medicine

## 2022-01-18 ENCOUNTER — Encounter: Payer: Self-pay | Admitting: Internal Medicine

## 2022-01-18 VITALS — BP 110/82 | HR 71 | Temp 98.1°F | Resp 16 | Ht 68.0 in | Wt 324.0 lb

## 2022-01-18 DIAGNOSIS — G4733 Obstructive sleep apnea (adult) (pediatric): Secondary | ICD-10-CM | POA: Diagnosis not present

## 2022-01-18 DIAGNOSIS — J449 Chronic obstructive pulmonary disease, unspecified: Secondary | ICD-10-CM | POA: Diagnosis not present

## 2022-01-18 DIAGNOSIS — Z7189 Other specified counseling: Secondary | ICD-10-CM | POA: Diagnosis not present

## 2022-01-18 NOTE — Progress Notes (Signed)
[facility] [place], [place] [address]  Pulmonary Sleep Medicine   Office Visit Note  Patient Name: Jason Hudson DOB: [birth year] MRN [id number]  Date of Service: 01/18/2022  Complaints/HPI: OSA doing about the same. He states he is using his CPAP device as ordered. Patient states he has stopped using his oxygen due to machine malfunction. He asked for a new one but he did not get it from his DME provider. So he asked them to take it back.  I explained to him that he does need to use the oximetry and we will try to see if we can get him a new device  ROS  General: (-) fever, (-) chills, (-) night sweats, (-) weakness Skin: (-) rashes, (-) itching,. Eyes: (-) visual changes, (-) redness, (-) itching. Nose and Sinuses: (-) nasal stuffiness or itchiness, (-) postnasal drip, (-) nosebleeds, (-) sinus trouble. Mouth and Throat: (-) sore throat, (-) hoarseness. Neck: (-) swollen glands, (-) enlarged thyroid, (-) neck pain. Respiratory: - cough, (-) bloody sputum, - shortness of breath, - wheezing. Cardiovascular: + ankle swelling, (-) chest pain. Lymphatic: (-) lymph node enlargement. Neurologic: (-) numbness, (-) tingling. Psychiatric: (-) anxiety, (-) depression   Current Medication: Outpatient Encounter Medications as of 01/18/2022  Medication Sig Note   albuterol (VENTOLIN HFA) 108 (90 Base) MCG/ACT inhaler Inhale 1-2 puffs into the lungs every 6 (six) hours as needed for wheezing or shortness of breath.    allopurinol (ZYLOPRIM) 100 MG tablet TAKE ONE TABLET (100 MG) BY MOUTH ONCE DAILY    aspirin EC 81 MG tablet Take 81 mg by mouth daily. Swallow whole.    carvedilol (COREG) 6.25 MG tablet Take 6.25 mg by mouth 2 (two) times daily with a meal. Pt gets from cardiologist    Cholecalciferol (VITAMIN D3) 50 MCG (2000 UT) TABS Take 2,000 Units by mouth at bedtime.    colchicine 0.6 MG tablet Day 1: take 2 tablets by mouth now then take 1 more tablet  by mouth 1 hour later.  After day 1, take 1 tablet daily until gout flare is resolved.  May repeat process as needed for future gout flare    doxazosin (CARDURA) 4 MG tablet TAKE 1 TABLET BY MOUTH AT BEDTIME    fluticasone (FLONASE) 50 MCG/ACT nasal spray Place 2 sprays into both nostrils 2 (two) times daily.     insulin degludec (TRESIBA) 200 UNIT/ML FlexTouch Pen Inject 120 Units into the skin daily before breakfast.    ipratropium (ATROVENT) 0.02 % nebulizer solution Take 2.5 mLs (0.5 mg total) by nebulization every 6 (six) hours as needed for wheezing or shortness of breath.    isosorbide mononitrate (IMDUR) 30 MG 24 hr tablet Take 30 mg by mouth daily.    levothyroxine (SYNTHROID) 88 MCG tablet TAKE ONE TABLET EACH MORNING BEFORE BREAKFAST    loratadine (CLARITIN) 10 MG tablet Take 10 mg by mouth daily.  08/24/2018: Takes liqui-gels at home   multivitamin (ONE-A-DAY MEN'S) TABS tablet Take 1 tablet by mouth daily.    nitroGLYCERIN (NITROSTAT) 0.4 MG SL tablet Place 1 tablet (0.4 mg total) under the tongue every 5 (five) minutes as needed for chest pain.    oxyCODONE (OXY IR/ROXICODONE) 5 MG immediate release tablet Take 1 tablet (5 mg total) by mouth every 6 (six) hours as needed for severe pain.    potassium chloride (KLOR-CON) 10 MEQ tablet TAKE TWO TABLETS EVERY OTHER DAY AND 1 TABLET ON THE DAYS IN BETWEEN  traZODone (DESYREL) 50 MG tablet TAKE ONE TABLET AT BEDTIME AS NEEDED FORSLEEP    TRESIBA FLEXTOUCH 100 UNIT/ML FlexTouch Pen INJECT 70 UNITS SQ TWICE A DAY    amiodarone (PACERONE) 200 MG tablet Take 1 tablet (200 mg total) by mouth daily for 30 days.    No facility-administered encounter medications on file as of 01/18/2022.    Surgical History: Past Surgical History:  Procedure Laterality Date   COLONOSCOPY WITH PROPOFOL N/A 11/07/2017   Procedure: COLONOSCOPY WITH PROPOFOL;  Surgeon: [name], MD;  Location: [facility] ENDOSCOPY;  Service: Gastroenterology;  Laterality: N/A;    ESOPHAGOGASTRODUODENOSCOPY (EGD) WITH PROPOFOL N/A 11/07/2017   Procedure: ESOPHAGOGASTRODUODENOSCOPY (EGD) WITH PROPOFOL;  Surgeon: [name], MD;  Location: [facility] ENDOSCOPY;  Service: Gastroenterology;  Laterality: N/A;   LEFT HEART CATH AND CORONARY ANGIOGRAPHY N/A 10/23/2017   Procedure: LEFT HEART CATH AND CORONARY ANGIOGRAPHY;  Surgeon: Isaias Cowman, MD;  Location: [facility];  Service: Cardiovascular;  Laterality: N/A;    Medical History: Past Medical History:  Diagnosis Date   CHF (congestive heart failure) (HCC)    COPD (chronic obstructive pulmonary disease) (HCC)    Diabetes mellitus without complication (HCC)    Edema    leg swelling   Gastroesophageal reflux disease 12/15/2017   Gout    Hypertension    Melena 11/05/2017   Pneumonia 07/27/2016    Family History: Family History  Problem Relation Age of Onset   Cancer Mother    CVA Father    Heart disease Father     Social History: Social History   Socioeconomic History   Marital status: Single    Spouse name: Not on file   Number of children: Not on file   Years of education: Not on file   Highest education level: Not on file  Occupational History   Not on file  Tobacco Use   Smoking status: Never   Smokeless tobacco: Never  Substance and Sexual Activity   Alcohol use: Not Currently    Comment: ocassionally   Drug use: Never   Sexual activity: Not on file  Other Topics Concern   Not on file  Social History Narrative   Not on file   Social Determinants of Health   Financial Resource Strain: Not on file  Food Insecurity: Not on file  Transportation Needs: Not on file  Physical Activity: Not on file  Stress: Not on file  Social Connections: Not on file  Intimate Partner Violence: Not on file    Vital Signs: Blood pressure 110/82, pulse 71, temperature 98.1 F (36.7 C), resp. rate 16, height '5\' 8"'$  (1.727 m), weight (!) 324 lb (147 kg), SpO2 95 %.  Examination: General  Appearance: The patient is well-developed, well-nourished, and in no distress. Skin: Gross inspection of skin unremarkable. Head: normocephalic, no gross deformities. Eyes: no gross deformities noted. ENT: ears appear grossly normal no exudates. Neck: Supple. No thyromegaly. No LAD. Respiratory: no rhonchi noted at this time. Cardiovascular: Normal S1 and S2 without murmur or rub. Extremities: No cyanosis. pulses are equal. Neurologic: Alert and oriented. No involuntary movements.  LABS: Recent Results (from the past 2160 hour(s))  POCT HgB A1C     Status: Abnormal   Collection Time: 11/10/21  4:04 PM  Result Value Ref Range   Hemoglobin A1C 7.6 (A) 4.0 - 5.6 %   HbA1c POC (<> result, manual entry)     HbA1c, POC (prediabetic range)     HbA1c, POC (controlled diabetic range)  UA/M w/rflx Culture, Routine     Status: Abnormal   Collection Time: 01/05/22  1:24 PM   Specimen: Urine   Urine  Result Value Ref Range   Specific Gravity, UA 1.009 1.005 - 1.030   pH, UA 8.0 (H) 5.0 - 7.5   Color, UA Yellow Yellow   Appearance Ur Clear Clear   Leukocytes,UA Negative Negative   Protein,UA Negative Negative/Trace   Glucose, UA Negative Negative   Ketones, UA Negative Negative   RBC, UA Negative Negative   Bilirubin, UA Negative Negative   Urobilinogen, Ur 0.2 0.2 - 1.0 mg/dL   Nitrite, UA Negative Negative   Microscopic Examination Comment     Comment: Microscopic follows if indicated.   Microscopic Examination See below:     Comment: Microscopic was indicated and was performed.   Urinalysis Reflex Comment     Comment: This specimen will not reflex to a Urine Culture.  Microscopic Examination     Status: None   Collection Time: 01/05/22  1:24 PM   Urine  Result Value Ref Range   WBC, UA None seen 0 - 5 /hpf   RBC 0-2 0 - 2 /hpf   Epithelial Cells (non renal) None seen 0 - 10 /hpf   Casts None seen None seen /lpf   Bacteria, UA None seen None seen/Few  PSA Total (Reflex To  Free)     Status: None   Collection Time: 01/06/22  2:36 PM  Result Value Ref Range   Prostate Specific Ag, Serum 0.2 0.0 - 4.0 ng/mL    Comment: Roche ECLIA methodology. According to the American Urological Association, Serum PSA should decrease and remain at undetectable levels after radical prostatectomy. The AUA defines biochemical recurrence as an initial PSA value 0.2 ng/mL or greater followed by a subsequent confirmatory PSA value 0.2 ng/mL or greater. Values obtained with different assay methods or kits cannot be used interchangeably. Results cannot be interpreted as absolute evidence of the presence or absence of malignant disease.    Reflex Criteria Comment     Comment: The percent free PSA is performed on a reflex basis only when the total PSA is between 4.0 and 10.0 ng/mL.     Radiology: DG Hand Complete Left  Result Date: 01/07/2022 CLINICAL DATA:  Left hand pain and swelling EXAM: LEFT HAND - COMPLETE 3+ VIEW COMPARISON:  None Available. FINDINGS: Normal alignment. No acute fracture or dislocation. Remote nonunited fracture of the distal tuft of the distal phalanx of the thumb. Normal bone mineral density. Moderate degenerative arthritis of the first, second, third, and fourth MCP joints. Suspected periarticular erosion involving the ulnar aspect of the third metacarpal head and associated joint effusion involving the third MCP joint. Mild degenerate arthritis of the interphalangeal joint of the thumb. Advanced degenerative arthritis of the first [facility] joint at the base of the thumb. Advanced vascular calcifications are noted. There is soft tissue swelling dorsal to the metacarpal heads. IMPRESSION: Periarticular erosion involving the third MCP joint with associated suspected joint effusion and overlying soft tissue swelling. Findings can be seen in the setting of gouty arthropathy. Polyarticular degenerative arthritis as outlined above. Mod nonunited fracture of the distal tuft of  the distal phalanx of the thumb. Electronically Signed   By: Fidela Salisbury M.D.   On: 01/07/2022 21:58    No results found.  DG Hand Complete Left  Result Date: 01/07/2022 CLINICAL DATA:  Left hand pain and swelling EXAM: LEFT HAND - COMPLETE 3+ VIEW COMPARISON:  None Available. FINDINGS: Normal alignment. No acute fracture or dislocation. Remote nonunited fracture of the distal tuft of the distal phalanx of the thumb. Normal bone mineral density. Moderate degenerative arthritis of the first, second, third, and fourth MCP joints. Suspected periarticular erosion involving the ulnar aspect of the third metacarpal head and associated joint effusion involving the third MCP joint. Mild degenerate arthritis of the interphalangeal joint of the thumb. Advanced degenerative arthritis of the first [facility] joint at the base of the thumb. Advanced vascular calcifications are noted. There is soft tissue swelling dorsal to the metacarpal heads. IMPRESSION: Periarticular erosion involving the third MCP joint with associated suspected joint effusion and overlying soft tissue swelling. Findings can be seen in the setting of gouty arthropathy. Polyarticular degenerative arthritis as outlined above. Mod nonunited fracture of the distal tuft of the distal phalanx of the thumb. Electronically Signed   By: Fidela Salisbury M.D.   On: 01/07/2022 21:58      Assessment and Plan: Patient Active Problem List   Diagnosis Date Noted   Gastric ulcer 05/18/2021   Benign hypertensive kidney disease with chronic kidney disease 02/15/2021   Proteinuria 02/15/2021   Secondary hyperparathyroidism of renal origin ([place]) 02/15/2021   Chronic kidney disease, stage 4 (severe) ([place]) 10/28/2020   Hypertension 10/28/2020   Low back pain due to bilateral sciatica 02/19/2020   Lumbar radiculopathy 02/18/2020   Encounter for general adult medical examination with abnormal findings 08/31/2019   Flu vaccine need 08/31/2019   Need for vaccination  against Streptococcus pneumoniae using pneumococcal conjugate vaccine 13 08/31/2019   Dysuria 08/31/2019   Irritable bowel syndrome with constipation 05/29/2019   Chronic kidney disease due to type 2 diabetes mellitus ([place]) 05/22/2019   Type 2 diabetes mellitus with hyperglycemia ([place]) 03/17/2019   Clinically significant macular edema 03/01/2019   Lower extremity edema 12/08/2018   Edema of lower extremity 12/08/2018   Diabetic ulcer of calf ([place]) 11/10/2018   Restrictive lung disease 10/26/2018   Acute exacerbation of CHF (congestive heart failure) ([place]) 09/12/2018   Acute on chronic systolic congestive heart failure ([place]) 09/06/2018   AKI (acute kidney injury) ([place]) 09/06/2018   Acute kidney failure, unspecified ([place]) 09/06/2018   Moderate intermittent asthma without complication 93/26/7124   Coronary arteriosclerosis 58/04/9832   Diastolic dysfunction 82/50/5397   Hypercholesterolemia 08/30/2018   Obesity 08/30/2018   COPD exacerbation ([place]) 08/24/2018   Hyperlipidemia due to type 2 diabetes mellitus ([place]) 03/28/2018   Type 2 diabetes mellitus with chronic kidney disease, with long-term current use of insulin ([place]) 03/28/2018   Type 2 diabetes mellitus with stage 4 chronic kidney disease, with long-term current use of insulin ([place]) 03/28/2018   Type 2 diabetes mellitus ([place]) 03/28/2018   Essential hypertension 01/19/2018   Chronic obstructive lung disease ([place]) 12/15/2017   Gastroesophageal reflux disease 12/15/2017   Obstructive sleep apnea of adult 12/15/2017   Chronic obstructive pulmonary disease ([place]) 12/15/2017   Melena 11/05/2017   Congestive heart failure ([place]) 10/20/2017   Heart failure, unspecified ([place]) 10/20/2017   Pneumonia 07/27/2016    1. OSA (obstructive sleep apnea) On PAP therapy does well supposed be on oxygen also currently does not have an oxygen device.  To staff about seeing what the issue is and try to help him get this resolved  2. Obesity, morbid  ([place]) Obesity Counseling: Had a lengthy discussion regarding patients BMI and weight issues. Patient was instructed on portion control as well as increased activity. Also discussed caloric restrictions with trying  to maintain intake less than 2000 Kcal. Discussions were made in accordance with the 5As of weight management. Simple actions such as not eating late and if able to, taking a walk is suggested.   3. CPAP use counseling CPAP Counseling: had a lengthy discussion with the patient regarding the importance of PAP therapy in management of the sleep apnea. Patient appears to understand the risk factor reduction and also understands the risks associated with untreated sleep apnea. Patient will try to make a good faith effort to remain compliant with therapy. Also instructed the patient on proper cleaning of the device including the water must be changed daily if possible and use of distilled water is preferred. Patient understands that the machine should be regularly cleaned with appropriate recommended cleaning solutions that do not damage the PAP machine for example given white vinegar and water rinses. Other methods such as ozone treatment may not be as good as these simple methods to achieve cleaning.   4. Chronic obstructive pulmonary disease, unspecified COPD type ([place]) Patient does have inhalers does not appear that he takes a regular maintenance inhaler does not feel he needs it patient has not got albuterol as well as ipratropium  General Counseling: I have discussed the findings of the evaluation and examination with Elenore Rota.  I have also discussed any further diagnostic evaluation thatmay be needed or ordered today. Kha verbalizes understanding of the findings of todays visit. We also reviewed his medications today and discussed drug interactions and side effects including but not limited excessive drowsiness and altered mental states. We also discussed that there is always a risk not just to  him but also people around him. he has been encouraged to call the office with any questions or concerns that should arise related to todays visit.  No orders of the defined types were placed in this encounter.    Time spent: 48  I have personally obtained a history, examined the patient, evaluated laboratory and imaging results, formulated the assessment and plan and placed orders.    Allyne Gee, MD [facility] Pulmonary and Critical Care Sleep medicine

## 2022-01-18 NOTE — Patient Instructions (Signed)

## 2022-01-24 DIAGNOSIS — B351 Tinea unguium: Secondary | ICD-10-CM | POA: Diagnosis not present

## 2022-01-24 DIAGNOSIS — M79674 Pain in right toe(s): Secondary | ICD-10-CM | POA: Diagnosis not present

## 2022-01-24 DIAGNOSIS — G8929 Other chronic pain: Secondary | ICD-10-CM | POA: Diagnosis not present

## 2022-01-24 DIAGNOSIS — L97512 Non-pressure chronic ulcer of other part of right foot with fat layer exposed: Secondary | ICD-10-CM | POA: Diagnosis not present

## 2022-01-24 DIAGNOSIS — L03031 Cellulitis of right toe: Secondary | ICD-10-CM | POA: Diagnosis not present

## 2022-01-24 DIAGNOSIS — M2042 Other hammer toe(s) (acquired), left foot: Secondary | ICD-10-CM | POA: Diagnosis not present

## 2022-01-24 DIAGNOSIS — M545 Low back pain, unspecified: Secondary | ICD-10-CM | POA: Diagnosis not present

## 2022-01-24 DIAGNOSIS — M2041 Other hammer toe(s) (acquired), right foot: Secondary | ICD-10-CM | POA: Diagnosis not present

## 2022-01-24 DIAGNOSIS — E1142 Type 2 diabetes mellitus with diabetic polyneuropathy: Secondary | ICD-10-CM | POA: Diagnosis not present

## 2022-01-24 DIAGNOSIS — M79675 Pain in left toe(s): Secondary | ICD-10-CM | POA: Diagnosis not present

## 2022-01-26 ENCOUNTER — Other Ambulatory Visit: Payer: Self-pay | Admitting: Nurse Practitioner

## 2022-01-26 DIAGNOSIS — E876 Hypokalemia: Secondary | ICD-10-CM

## 2022-02-07 DIAGNOSIS — L97512 Non-pressure chronic ulcer of other part of right foot with fat layer exposed: Secondary | ICD-10-CM | POA: Diagnosis not present

## 2022-02-07 DIAGNOSIS — E1142 Type 2 diabetes mellitus with diabetic polyneuropathy: Secondary | ICD-10-CM | POA: Diagnosis not present

## 2022-02-08 DIAGNOSIS — E876 Hypokalemia: Secondary | ICD-10-CM | POA: Diagnosis not present

## 2022-02-08 DIAGNOSIS — I10 Essential (primary) hypertension: Secondary | ICD-10-CM | POA: Diagnosis not present

## 2022-02-08 DIAGNOSIS — N184 Chronic kidney disease, stage 4 (severe): Secondary | ICD-10-CM | POA: Diagnosis not present

## 2022-02-08 DIAGNOSIS — E1122 Type 2 diabetes mellitus with diabetic chronic kidney disease: Secondary | ICD-10-CM | POA: Diagnosis not present

## 2022-02-08 DIAGNOSIS — R600 Localized edema: Secondary | ICD-10-CM | POA: Diagnosis not present

## 2022-02-09 ENCOUNTER — Ambulatory Visit (INDEPENDENT_AMBULATORY_CARE_PROVIDER_SITE_OTHER): Payer: HMO | Admitting: Nurse Practitioner

## 2022-02-09 ENCOUNTER — Encounter: Payer: Self-pay | Admitting: Nurse Practitioner

## 2022-02-09 VITALS — BP 122/63 | HR 70 | Temp 98.0°F | Resp 16 | Ht 68.0 in | Wt 323.0 lb

## 2022-02-09 DIAGNOSIS — N184 Chronic kidney disease, stage 4 (severe): Secondary | ICD-10-CM

## 2022-02-09 DIAGNOSIS — J449 Chronic obstructive pulmonary disease, unspecified: Secondary | ICD-10-CM | POA: Diagnosis not present

## 2022-02-09 DIAGNOSIS — E1122 Type 2 diabetes mellitus with diabetic chronic kidney disease: Secondary | ICD-10-CM

## 2022-02-09 DIAGNOSIS — Z794 Long term (current) use of insulin: Secondary | ICD-10-CM | POA: Diagnosis not present

## 2022-02-09 DIAGNOSIS — M1A3310 Chronic gout due to renal impairment, right wrist, without tophus (tophi): Secondary | ICD-10-CM | POA: Diagnosis not present

## 2022-02-09 LAB — POCT GLYCOSYLATED HEMOGLOBIN (HGB A1C): Hemoglobin A1C: 7.5 % — AB (ref 4.0–5.6)

## 2022-02-09 MED ORDER — OXYCODONE HCL 5 MG PO TABS: 5.0000 mg | ORAL_TABLET | Freq: Four times a day (QID) | ORAL | 0 refills | Status: DC | PRN

## 2022-02-09 NOTE — Progress Notes (Signed)
[facility] [place], [place] [address]  Internal MEDICINE  Office Visit Note  Patient Name: Jason Hudson  [id number]  [id number]  Date of Service: 02/09/2022  Chief Complaint  Patient presents with   Follow-up    Discuss meds   Diabetes   Gastroesophageal Reflux   Hypertension   COPD    HPI Jason Hudson presents for follow-up visit for diabetes, hypertension, COPD and medication refills.  His A1c slightly improved to 7.5 today.  His previous A1c was 7.6 in March.  He has stage IV CKD and his nephrologist started him on Farxiga which will also have a positive impact on his diabetes. -- Takes oxycodone 5 mg every 6 hours as needed for chronic gouty arthritic pain.  He only takes it when he needs it and usually requests a refill when he is out.  He is consistent and is seen in the clinic every 3 months. -- He was recently seen by Dr. Devona Konig regarding OSA and CPAP use.  He was also using oxygen at night but has not been using it recently, only CPAP -- He has COPD and is not currently on a maintenance inhaler.  He has tried multiple maintenance inhalers in the past, they are a third too expensive with his insurance or not covered.  He does have an albuterol rescue inhaler if he needs it and ipratropium nebulizer solution and a nebulizer machine.    Current Medication: Outpatient Encounter Medications as of 02/09/2022  Medication Sig Note   albuterol (VENTOLIN HFA) 108 (90 Base) MCG/ACT inhaler Inhale 1-2 puffs into the lungs every 6 (six) hours as needed for wheezing or shortness of breath.    allopurinol (ZYLOPRIM) 100 MG tablet TAKE ONE TABLET (100 MG) BY MOUTH ONCE DAILY    aspirin EC 81 MG tablet Take 81 mg by mouth daily. Swallow whole.    carvedilol (COREG) 6.25 MG tablet Take 6.25 mg by mouth 2 (two) times daily with a meal. Pt gets from cardiologist    Cholecalciferol (VITAMIN D3) 50 MCG (2000 UT) TABS Take 2,000 Units by mouth at bedtime.     colchicine 0.6 MG tablet Day 1: take 2 tablets by mouth now then take 1 more tablet by mouth 1 hour later.  After day 1, take 1 tablet daily until gout flare is resolved.  May repeat process as needed for future gout flare    doxazosin (CARDURA) 4 MG tablet TAKE 1 TABLET BY MOUTH AT BEDTIME    fluticasone (FLONASE) 50 MCG/ACT nasal spray Place 2 sprays into both nostrils 2 (two) times daily.     insulin degludec (TRESIBA) 200 UNIT/ML FlexTouch Pen Inject 120 Units into the skin daily before breakfast.    ipratropium (ATROVENT) 0.02 % nebulizer solution Take 2.5 mLs (0.5 mg total) by nebulization every 6 (six) hours as needed for wheezing or shortness of breath.    isosorbide mononitrate (IMDUR) 30 MG 24 hr tablet Take 30 mg by mouth daily.    levothyroxine (SYNTHROID) 88 MCG tablet TAKE ONE TABLET EACH MORNING BEFORE BREAKFAST    loratadine (CLARITIN) 10 MG tablet Take 10 mg by mouth daily.  08/24/2018: Takes liqui-gels at home   multivitamin (ONE-A-DAY MEN'S) TABS tablet Take 1 tablet by mouth daily.    nitroGLYCERIN (NITROSTAT) 0.4 MG SL tablet Place 1 tablet (0.4 mg total) under the tongue every 5 (five) minutes as needed for chest pain.    potassium chloride (KLOR-CON) 10 MEQ tablet TAKE TWO TABLETS  EVERY OTHER DAY AND 1 TABLET ON THE DAYS IN BETWEEN    traZODone (DESYREL) 50 MG tablet TAKE ONE TABLET AT BEDTIME AS NEEDED FORSLEEP    TRESIBA FLEXTOUCH 100 UNIT/ML FlexTouch Pen INJECT 70 UNITS SQ TWICE A DAY    [DISCONTINUED] oxyCODONE (OXY IR/ROXICODONE) 5 MG immediate release tablet Take 1 tablet (5 mg total) by mouth every 6 (six) hours as needed for severe pain.    amiodarone (PACERONE) 200 MG tablet Take 1 tablet (200 mg total) by mouth daily for 30 days.    oxyCODONE (OXY IR/ROXICODONE) 5 MG immediate release tablet Take 1 tablet (5 mg total) by mouth every 6 (six) hours as needed for severe pain.    No facility-administered encounter medications on file as of 02/09/2022.    Surgical  History: Past Surgical History:  Procedure Laterality Date   COLONOSCOPY WITH PROPOFOL N/A 11/07/2017   Procedure: COLONOSCOPY WITH PROPOFOL;  Surgeon: [name], MD;  Location: [facility] ENDOSCOPY;  Service: Gastroenterology;  Laterality: N/A;   ESOPHAGOGASTRODUODENOSCOPY (EGD) WITH PROPOFOL N/A 11/07/2017   Procedure: ESOPHAGOGASTRODUODENOSCOPY (EGD) WITH PROPOFOL;  Surgeon: [name], MD;  Location: [facility] ENDOSCOPY;  Service: Gastroenterology;  Laterality: N/A;   LEFT HEART CATH AND CORONARY ANGIOGRAPHY N/A 10/23/2017   Procedure: LEFT HEART CATH AND CORONARY ANGIOGRAPHY;  Surgeon: Isaias Cowman, MD;  Location: [facility];  Service: Cardiovascular;  Laterality: N/A;    Medical History: Past Medical History:  Diagnosis Date   CHF (congestive heart failure) (HCC)    COPD (chronic obstructive pulmonary disease) (HCC)    Diabetes mellitus without complication (HCC)    Edema    leg swelling   Gastroesophageal reflux disease 12/15/2017   Gout    Hypertension    Melena 11/05/2017   Pneumonia 07/27/2016    Family History: Family History  Problem Relation Age of Onset   Cancer Mother    CVA Father    Heart disease Father     Social History   Socioeconomic History   Marital status: Single    Spouse name: Not on file   Number of children: Not on file   Years of education: Not on file   Highest education level: Not on file  Occupational History   Not on file  Tobacco Use   Smoking status: Never   Smokeless tobacco: Never  Substance and Sexual Activity   Alcohol use: Not Currently    Comment: ocassionally   Drug use: Never   Sexual activity: Not on file  Other Topics Concern   Not on file  Social History Narrative   Not on file   Social Determinants of Health   Financial Resource Strain: Not on file  Food Insecurity: Not on file  Transportation Needs: Not on file  Physical Activity: Not on file  Stress: Not on file  Social Connections: Not on file   Intimate Partner Violence: Not on file      Review of Systems  Constitutional: Negative.  Negative for chills, fatigue and unexpected weight change.  HENT: Negative.  Negative for congestion, rhinorrhea, sneezing and sore throat.   Eyes:  Negative for redness.  Respiratory:  Positive for shortness of breath (intermittent). Negative for cough, chest tightness and wheezing.   Cardiovascular:  Positive for leg swelling (chronic bilateral lower extremity edema). Negative for chest pain and palpitations.  Gastrointestinal:  Negative for abdominal pain, constipation, diarrhea, nausea and vomiting.  Genitourinary:  Negative for dysuria and frequency.  Musculoskeletal:  Negative for arthralgias, back pain,  joint swelling and neck pain.  Skin:  Negative for rash.  Neurological: Negative.  Negative for tremors and numbness.  Hematological:  Negative for adenopathy. Does not bruise/bleed easily.  Psychiatric/Behavioral:  Negative for behavioral problems (Depression), sleep disturbance and suicidal ideas. The patient is not nervous/anxious.     Vital Signs: BP 122/63   Pulse 70   Temp 98 F (36.7 C)   Resp 16   Ht '5\' 8"'$  (1.727 m)   Wt (!) 323 lb (146.5 kg)   SpO2 98%   BMI 49.11 kg/m    Physical Exam Vitals reviewed.  Constitutional:      General: He is not in acute distress.    Appearance: Normal appearance. He is obese. He is not ill-appearing.  HENT:     Head: Normocephalic and atraumatic.  Eyes:     Pupils: Pupils are equal, round, and reactive to light.  Cardiovascular:     Rate and Rhythm: Normal rate and regular rhythm.  Pulmonary:     Effort: Pulmonary effort is normal. No respiratory distress.  Neurological:     Mental Status: He is alert and oriented to person, place, and time.     Cranial Nerves: No cranial nerve deficit.     Coordination: Coordination normal.     Gait: Gait normal.  Psychiatric:        Mood and Affect: Mood normal.        Behavior: Behavior  normal.       Assessment/Plan: 1. Type 2 diabetes mellitus with stage 4 chronic kidney disease, with long-term current use of insulin (HCC) A1c slightly improved to 7.5 today, continue medications as prescribed, no changes today, will repeat A1c in 3 months - POCT HgB A1C  2. Chronic kidney disease, stage 4 (severe) (HCC) Followed by nephrology, he was recently seen by Dr. Candiss Norse and was started on Farxiga which will benefit his kidneys as well as his diabetes.  3. Chronic obstructive pulmonary disease, unspecified COPD type (HCC) Stable, no maintenance inhaler at this time.  Patient is not interested in trying any more maintenance inhalers right now.  An albuterol inhaler as needed and ipratropium nebulizer treatment as needed.  Is also followed by pulmonology with Dr. Devona Konig  4. 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  Dispense: 60 tablet; Refill: 0   General Counseling: Malva Cogan understanding of the findings of todays visit and agrees with plan of treatment. I have discussed any further diagnostic evaluation that may be needed or ordered today. We also reviewed his medications today. he has been encouraged to call the office with any questions or concerns that should arise related to todays visit.    Orders Placed This Encounter  Procedures   POCT HgB A1C    Meds ordered this encounter  Medications   oxyCODONE (OXY IR/ROXICODONE) 5 MG immediate release tablet    Sig: Take 1 tablet (5 mg total) by mouth every 6 (six) hours as needed for severe pain.    Dispense:  60 tablet    Refill:  0    For future refill    Return in about 3 months (around 05/12/2022) for F/U, Recheck A1C, Zuhair Lariccia PCP.   Total time  spent:30 Minutes Time spent includes review of  chart, medications, test results, and follow up plan with the patient.   [place] Controlled Substance Database was reviewed by me.  This patient was seen by Jonetta Osgood, FNP-C in collaboration with Dr. Clayborn Bigness as a part of collaborative care agreement.   Alquan Morrish R. Valetta Fuller, MSN, FNP-C Internal medicine

## 2022-02-28 DIAGNOSIS — M2042 Other hammer toe(s) (acquired), left foot: Secondary | ICD-10-CM | POA: Diagnosis not present

## 2022-02-28 DIAGNOSIS — M2041 Other hammer toe(s) (acquired), right foot: Secondary | ICD-10-CM | POA: Diagnosis not present

## 2022-02-28 DIAGNOSIS — E1142 Type 2 diabetes mellitus with diabetic polyneuropathy: Secondary | ICD-10-CM | POA: Diagnosis not present

## 2022-02-28 DIAGNOSIS — L02611 Cutaneous abscess of right foot: Secondary | ICD-10-CM | POA: Diagnosis not present

## 2022-02-28 DIAGNOSIS — L97512 Non-pressure chronic ulcer of other part of right foot with fat layer exposed: Secondary | ICD-10-CM | POA: Diagnosis not present

## 2022-02-28 DIAGNOSIS — L03031 Cellulitis of right toe: Secondary | ICD-10-CM | POA: Diagnosis not present

## 2022-03-01 ENCOUNTER — Telehealth: Payer: Self-pay

## 2022-03-01 ENCOUNTER — Other Ambulatory Visit: Payer: Self-pay | Admitting: Nurse Practitioner

## 2022-03-01 ENCOUNTER — Encounter: Payer: Self-pay | Admitting: Nurse Practitioner

## 2022-03-01 DIAGNOSIS — E113311 Type 2 diabetes mellitus with moderate nonproliferative diabetic retinopathy with macular edema, right eye: Secondary | ICD-10-CM

## 2022-03-01 DIAGNOSIS — E113292 Type 2 diabetes mellitus with mild nonproliferative diabetic retinopathy without macular edema, left eye: Secondary | ICD-10-CM

## 2022-03-01 NOTE — Telephone Encounter (Signed)
Spoke with pt that diabetic eye exam show diabetic retinopathy as per pt he already diagnosis by [place] [facility] exam and he already seen retina specialist in [place] advised pt that we need progress note from specialist

## 2022-03-04 ENCOUNTER — Telehealth: Payer: Self-pay

## 2022-03-04 NOTE — Telephone Encounter (Signed)
Ophthalmology referral sent via [facility] to [facility] 

## 2022-03-18 ENCOUNTER — Other Ambulatory Visit: Payer: Self-pay | Admitting: Nurse Practitioner

## 2022-03-18 DIAGNOSIS — E039 Hypothyroidism, unspecified: Secondary | ICD-10-CM

## 2022-03-25 DIAGNOSIS — B351 Tinea unguium: Secondary | ICD-10-CM | POA: Diagnosis not present

## 2022-03-25 DIAGNOSIS — E1142 Type 2 diabetes mellitus with diabetic polyneuropathy: Secondary | ICD-10-CM | POA: Diagnosis not present

## 2022-03-25 DIAGNOSIS — M79675 Pain in left toe(s): Secondary | ICD-10-CM | POA: Diagnosis not present

## 2022-03-25 DIAGNOSIS — M79674 Pain in right toe(s): Secondary | ICD-10-CM | POA: Diagnosis not present

## 2022-03-25 DIAGNOSIS — L97512 Non-pressure chronic ulcer of other part of right foot with fat layer exposed: Secondary | ICD-10-CM | POA: Diagnosis not present

## 2022-04-01 ENCOUNTER — Telehealth: Payer: Self-pay

## 2022-04-01 NOTE — Telephone Encounter (Signed)
Per [facility] w/ [facility], referral closed due to patient not returning calls to [facility]

## 2022-04-02 ENCOUNTER — Encounter: Payer: Self-pay | Admitting: Nurse Practitioner

## 2022-04-08 ENCOUNTER — Telehealth: Payer: Self-pay

## 2022-04-08 NOTE — Telephone Encounter (Signed)
Per [facility] request, last office not faxed to them [phone]

## 2022-04-14 DIAGNOSIS — D631 Anemia in chronic kidney disease: Secondary | ICD-10-CM | POA: Diagnosis not present

## 2022-04-14 DIAGNOSIS — N1832 Chronic kidney disease, stage 3b: Secondary | ICD-10-CM | POA: Diagnosis not present

## 2022-04-14 DIAGNOSIS — E1122 Type 2 diabetes mellitus with diabetic chronic kidney disease: Secondary | ICD-10-CM | POA: Diagnosis not present

## 2022-04-14 DIAGNOSIS — I10 Essential (primary) hypertension: Secondary | ICD-10-CM | POA: Diagnosis not present

## 2022-04-14 DIAGNOSIS — R600 Localized edema: Secondary | ICD-10-CM | POA: Diagnosis not present

## 2022-04-14 DIAGNOSIS — N2581 Secondary hyperparathyroidism of renal origin: Secondary | ICD-10-CM | POA: Diagnosis not present

## 2022-04-18 ENCOUNTER — Telehealth: Payer: Self-pay

## 2022-04-20 ENCOUNTER — Other Ambulatory Visit: Payer: Self-pay | Admitting: Nurse Practitioner

## 2022-04-20 DIAGNOSIS — M1A3310 Chronic gout due to renal impairment, right wrist, without tophus (tophi): Secondary | ICD-10-CM

## 2022-04-21 ENCOUNTER — Other Ambulatory Visit: Payer: Self-pay | Admitting: Nurse Practitioner

## 2022-04-21 ENCOUNTER — Other Ambulatory Visit: Payer: Self-pay

## 2022-04-21 ENCOUNTER — Emergency Department: Payer: HMO

## 2022-04-21 DIAGNOSIS — I130 Hypertensive heart and chronic kidney disease with heart failure and stage 1 through stage 4 chronic kidney disease, or unspecified chronic kidney disease: Secondary | ICD-10-CM | POA: Diagnosis not present

## 2022-04-21 DIAGNOSIS — Z794 Long term (current) use of insulin: Secondary | ICD-10-CM | POA: Diagnosis not present

## 2022-04-21 DIAGNOSIS — Z20822 Contact with and (suspected) exposure to covid-19: Secondary | ICD-10-CM | POA: Insufficient documentation

## 2022-04-21 DIAGNOSIS — Z79899 Other long term (current) drug therapy: Secondary | ICD-10-CM | POA: Diagnosis not present

## 2022-04-21 DIAGNOSIS — J441 Chronic obstructive pulmonary disease with (acute) exacerbation: Secondary | ICD-10-CM | POA: Insufficient documentation

## 2022-04-21 DIAGNOSIS — Z7982 Long term (current) use of aspirin: Secondary | ICD-10-CM | POA: Insufficient documentation

## 2022-04-21 DIAGNOSIS — N184 Chronic kidney disease, stage 4 (severe): Secondary | ICD-10-CM | POA: Insufficient documentation

## 2022-04-21 DIAGNOSIS — Z955 Presence of coronary angioplasty implant and graft: Secondary | ICD-10-CM | POA: Insufficient documentation

## 2022-04-21 DIAGNOSIS — I5023 Acute on chronic systolic (congestive) heart failure: Secondary | ICD-10-CM | POA: Diagnosis not present

## 2022-04-21 DIAGNOSIS — E1122 Type 2 diabetes mellitus with diabetic chronic kidney disease: Secondary | ICD-10-CM | POA: Insufficient documentation

## 2022-04-21 DIAGNOSIS — R0602 Shortness of breath: Secondary | ICD-10-CM | POA: Diagnosis not present

## 2022-04-21 LAB — CBC
HCT: 40 % (ref 39.0–52.0)
Hemoglobin: 12.7 g/dL — ABNORMAL LOW (ref 13.0–17.0)
MCH: 30 pg (ref 26.0–34.0)
MCHC: 31.8 g/dL (ref 30.0–36.0)
MCV: 94.3 fL (ref 80.0–100.0)
Platelets: 220 10*3/uL (ref 150–400)
RBC: 4.24 MIL/uL (ref 4.22–5.81)
RDW: 15.7 % — ABNORMAL HIGH (ref 11.5–15.5)
WBC: 10.7 10*3/uL — ABNORMAL HIGH (ref 4.0–10.5)
nRBC: 0 % (ref 0.0–0.2)

## 2022-04-21 LAB — RESP PANEL BY RT-PCR (FLU A&B, COVID) ARPGX2
Influenza A by PCR: NEGATIVE
Influenza B by PCR: NEGATIVE
SARS Coronavirus 2 by RT PCR: NEGATIVE

## 2022-04-21 LAB — BASIC METABOLIC PANEL
Anion gap: 14 (ref 5–15)
BUN: 37 mg/dL — ABNORMAL HIGH (ref 8–23)
CO2: 25 mmol/L (ref 22–32)
Calcium: 9.2 mg/dL (ref 8.9–10.3)
Chloride: 96 mmol/L — ABNORMAL LOW (ref 98–111)
Creatinine, Ser: 2.54 mg/dL — ABNORMAL HIGH (ref 0.61–1.24)
GFR, Estimated: 27 mL/min — ABNORMAL LOW (ref >60)
Glucose, Bld: 340 mg/dL — ABNORMAL HIGH (ref 70–99)
Potassium: 3.5 mmol/L (ref 3.5–5.1)
Sodium: 135 mmol/L (ref 135–145)

## 2022-04-21 LAB — TROPONIN I (HIGH SENSITIVITY)
Troponin I (High Sensitivity): 9 ng/L (ref <18)
Troponin I (High Sensitivity): 11 ng/L (ref <18)

## 2022-04-21 NOTE — Telephone Encounter (Signed)
Med sent.

## 2022-04-21 NOTE — ED Triage Notes (Signed)
Pt arrives with c/o SOB and bodyaches. Pt endorses fatigue, cough, and runny nose. These symptoms started about a week ago.

## 2022-04-22 ENCOUNTER — Other Ambulatory Visit: Payer: Self-pay | Admitting: Nurse Practitioner

## 2022-04-22 ENCOUNTER — Emergency Department
Admission: EM | Admit: 2022-04-22 | Discharge: 2022-04-22 | Disposition: A | Discharge status: Home / Self Care | Payer: HMO | Attending: Emergency Medicine | Admitting: Emergency Medicine

## 2022-04-22 DIAGNOSIS — J441 Chronic obstructive pulmonary disease with (acute) exacerbation: Secondary | ICD-10-CM

## 2022-04-22 LAB — BRAIN NATRIURETIC PEPTIDE: B Natriuretic Peptide: 54.6 pg/mL (ref 0.0–100.0)

## 2022-04-22 MED ORDER — METHYLPREDNISOLONE SODIUM SUCC 125 MG IJ SOLR
125.0000 mg | Freq: Once | INTRAMUSCULAR | Status: AC
  Administered 2022-04-22: 125 mg via INTRAVENOUS
  Filled 2022-04-22: qty 2

## 2022-04-22 MED ORDER — PREDNISONE 20 MG PO TABS: ORAL_TABLET | ORAL | 0 refills | Status: DC

## 2022-04-22 MED ORDER — IPRATROPIUM-ALBUTEROL 0.5-2.5 (3) MG/3ML IN SOLN
3.0000 mL | Freq: Once | RESPIRATORY_TRACT | Status: AC
  Administered 2022-04-22: 3 mL via RESPIRATORY_TRACT
  Filled 2022-04-22: qty 3

## 2022-04-22 MED ORDER — SODIUM CHLORIDE 0.9 % IV BOLUS
250.0000 mL | Freq: Once | INTRAVENOUS | Status: AC
  Administered 2022-04-22: 250 mL via INTRAVENOUS

## 2022-04-22 MED ORDER — HYDROCOD POLI-CHLORPHE POLI ER 10-8 MG/5ML PO SUER
5.0000 mL | Freq: Once | ORAL | Status: AC
  Administered 2022-04-22: 5 mL via ORAL
  Filled 2022-04-22: qty 5

## 2022-04-22 NOTE — ED Notes (Signed)
Pt being trialed on RA. Pt to trial ambulatory pusle ox when neb treatment complete.

## 2022-04-22 NOTE — ED Notes (Signed)
Ambulatory trial completed. Pt maintained Spo2 between 88-94% on RA while ambulating. Pt only briefly, ~5 seconds dropped to 88% and then improved. Pt reports feeling SOB while ambulating but improved from when pt first presented to department. MD made aware of results.

## 2022-04-22 NOTE — Telephone Encounter (Signed)
error 

## 2022-04-22 NOTE — ED Provider Notes (Signed)
[facility] Provider Note    Event Date/Time   First MD Initiated Contact with Patient 04/22/22 [phone]     (approximate)   History   Shortness of Breath   HPI  Jason Hudson is a 71 y.o. male who presents to the ED from home with a chief complaint of cough, runny nose, shortness of breath and body aches.  Symptoms x1 week.  History of CHF and COPD not on home oxygen.  Denies fever, chest pain, abdominal pain, nausea, vomiting or dizziness.     Past Medical History   Past Medical History:  Diagnosis Date   CHF (congestive heart failure) (HCC)    COPD (chronic obstructive pulmonary disease) (HCC)    Diabetes mellitus without complication (HCC)    Edema    leg swelling   Gastroesophageal reflux disease 12/15/2017   Gout    Hypertension    Melena 11/05/2017   Pneumonia 07/27/2016     Active Problem List   Patient Active Problem List   Diagnosis Date Noted   Gastric ulcer 05/18/2021   Benign hypertensive kidney disease with chronic kidney disease 02/15/2021   Proteinuria 02/15/2021   Secondary hyperparathyroidism of renal origin ([place]) 02/15/2021   Chronic kidney disease, stage 4 (severe) ([place]) 10/28/2020   Hypertension 10/28/2020   Low back pain due to bilateral sciatica 02/19/2020   Lumbar radiculopathy 02/18/2020   Encounter for general adult medical examination with abnormal findings 08/31/2019   Flu vaccine need 08/31/2019   Need for vaccination against Streptococcus pneumoniae using pneumococcal conjugate vaccine 13 08/31/2019   Dysuria 08/31/2019   Irritable bowel syndrome with constipation 05/29/2019   Chronic kidney disease due to type 2 diabetes mellitus ([place]) 05/22/2019   Type 2 diabetes mellitus with hyperglycemia ([place]) 03/17/2019   Clinically significant macular edema 03/01/2019   Lower extremity edema 12/08/2018   Edema of lower extremity 12/08/2018   Diabetic ulcer of calf ([place]) 11/10/2018   Restrictive lung disease  10/26/2018   Acute exacerbation of CHF (congestive heart failure) ([place]) 09/12/2018   Acute on chronic systolic congestive heart failure ([place]) 09/06/2018   AKI (acute kidney injury) ([place]) 09/06/2018   Acute kidney failure, unspecified ([place]) 09/06/2018   Moderate intermittent asthma without complication 58/04/9832   Coronary arteriosclerosis 82/50/5397   Diastolic dysfunction 67/34/1937   Hypercholesterolemia 08/30/2018   Obesity 08/30/2018   COPD exacerbation ([place]) 08/24/2018   Hyperlipidemia due to type 2 diabetes mellitus ([place]) 03/28/2018   Type 2 diabetes mellitus with chronic kidney disease, with long-term current use of insulin ([place]) 03/28/2018   Type 2 diabetes mellitus with stage 4 chronic kidney disease, with long-term current use of insulin ([place]) 03/28/2018   Type 2 diabetes mellitus ([place]) 03/28/2018   Essential hypertension 01/19/2018   Chronic obstructive lung disease ([place]) 12/15/2017   Gastroesophageal reflux disease 12/15/2017   Obstructive sleep apnea of adult 12/15/2017   Chronic obstructive pulmonary disease ([place]) 12/15/2017   Melena 11/05/2017   Congestive heart failure ([place]) 10/20/2017   Heart failure, unspecified ([place]) 10/20/2017   Pneumonia 07/27/2016     Past Surgical History   Past Surgical History:  Procedure Laterality Date   COLONOSCOPY WITH PROPOFOL N/A 11/07/2017   Procedure: COLONOSCOPY WITH PROPOFOL;  Surgeon: [name], MD;  [facility] ENDOSCOPY;  Service: Gastroenterology;  Laterality: N/A;   ESOPHAGOGASTRODUODENOSCOPY (EGD) WITH PROPOFOL N/A 11/07/2017   Procedure: ESOPHAGOGASTRODUODENOSCOPY (EGD) WITH PROPOFOL;  Surgeon: [name], MD;  Location: [facility] ENDOSCOPY;  Service: Gastroenterology;  Laterality: N/A;  LEFT HEART CATH AND CORONARY ANGIOGRAPHY N/A 10/23/2017   Procedure: LEFT HEART CATH AND CORONARY ANGIOGRAPHY;  Surgeon: Isaias Cowman, MD;  Location: [place] CV LAB;  Service: Cardiovascular;  Laterality: N/A;     Home  Medications   Prior to Admission medications   Medication Sig Start Date End Date Taking? Authorizing Provider  albuterol (VENTOLIN HFA) 108 (90 Base) MCG/ACT inhaler Inhale 1-2 puffs into the lungs every 6 (six) hours as needed for wheezing or shortness of breath. 11/18/21   McDonough, Si Gaul, PA-C  allopurinol (ZYLOPRIM) 100 MG tablet TAKE ONE TABLET (100 MG) BY MOUTH ONCE DAILY 01/12/22   Jonetta Osgood, NP  amiodarone (PACERONE) 200 MG tablet Take 1 tablet (200 mg total) by mouth daily for 30 days. 09/15/18 10/15/18  Saundra Shelling, MD  aspirin EC 81 MG tablet Take 81 mg by mouth daily. Swallow whole.    [provider]  carvedilol (COREG) 6.25 MG tablet Take 6.25 mg by mouth 2 (two) times daily with a meal. Pt gets from cardiologist    [provider]  Cholecalciferol (VITAMIN D3) 50 MCG (2000 UT) TABS Take 2,000 Units by mouth at bedtime. 09/18/20   Luiz Ochoa, NP  colchicine 0.6 MG tablet Day 1: take 2 tablets by mouth now then take 1 more tablet by mouth 1 hour later.  After day 1, take 1 tablet daily until gout flare is resolved.  May repeat process as needed for future gout flare 01/08/22   Jonetta Osgood, NP  doxazosin (CARDURA) 4 MG tablet TAKE 1 TABLET BY MOUTH AT BEDTIME 11/27/21   McDonough, Lauren K, PA-C  fluticasone (FLONASE) 50 MCG/ACT nasal spray Place 2 sprays into both nostrils 2 (two) times daily.     [provider]  insulin degludec (TRESIBA) 200 UNIT/ML FlexTouch Pen Inject 120 Units into the skin daily before breakfast. 11/10/21   Jonetta Osgood, NP  ipratropium (ATROVENT) 0.02 % nebulizer solution Take 2.5 mLs (0.5 mg total) by nebulization every 6 (six) hours as needed for wheezing or shortness of breath. 11/29/21   Jonetta Osgood, NP  isosorbide mononitrate (IMDUR) 30 MG 24 hr tablet Take 30 mg by mouth daily. 08/13/21   [provider]  levothyroxine (SYNTHROID) 88 MCG tablet TAKE ONE TABLET EACH MORNING BEFORE BREAKFAST  03/18/22   Jonetta Osgood, NP  loratadine (CLARITIN) 10 MG tablet Take 10 mg by mouth daily.     [provider]  multivitamin (ONE-A-DAY MEN'S) TABS tablet Take 1 tablet by mouth daily.    [provider]  nitroGLYCERIN (NITROSTAT) 0.4 MG SL tablet Place 1 tablet (0.4 mg total) under the tongue every 5 (five) minutes as needed for chest pain. 10/23/17   Bettey Costa, MD  oxyCODONE (OXY IR/ROXICODONE) 5 MG immediate release tablet TAKE ONE TABLET BY MOUTH EVERY 6 HOURS AS NEEDED FOR SEVERE PAIN 04/21/22   Jonetta Osgood, NP  potassium chloride (KLOR-CON) 10 MEQ tablet TAKE TWO TABLETS EVERY OTHER DAY AND 1 TABLET ON THE DAYS IN BETWEEN 01/26/22   Jonetta Osgood, NP  traZODone (DESYREL) 50 MG tablet TAKE ONE TABLET AT BEDTIME AS NEEDED FORSLEEP 08/30/21   Jonetta Osgood, NP  TRESIBA FLEXTOUCH 100 UNIT/ML FlexTouch Pen INJECT 70 UNITS SQ TWICE A DAY 01/14/22   Abernathy, Yetta Flock, NP     Allergies  Red dye, Atorvastatin, and Atorvastatin calcium   Family History   Family History  Problem Relation Age of Onset   Cancer Mother    CVA Father  Heart disease Father      Physical Exam  Triage Vital Signs: ED Triage Vitals  Enc Vitals Group     BP 04/21/22 2011 111/67     Pulse Rate 04/21/22 2011 66     Resp 04/21/22 2011 20     Temp 04/21/22 2011 97.8 F (36.6 C)     Temp Source 04/21/22 2011 Oral     SpO2 04/21/22 2011 96 %     Weight 04/21/22 2012 (!) 314 lb (142.4 kg)     Height 04/21/22 2012 '5\' 8"'$  (1.727 m)     Head Circumference --      Peak Flow --      Pain Score 04/21/22 2018 5     Pain Loc --      Pain Edu? --      Excl. in [place]? --     Updated Vital Signs: BP 133/80   Pulse 66   Temp 98.3 F (36.8 C) (Oral)   Resp 19   Ht '5\' 8"'$  (1.727 m)   Wt (!) 142.4 kg   SpO2 92%   BMI 47.74 kg/m    General: Awake, no distress.  CV:  RRR.  Good peripheral perfusion.  Resp:  Normal effort.  Diminished all lung fields. Abd:  Nontender.  No distention.   Other:  Lymphedema which patient states is baseline.   ED Results / Procedures / Treatments  Labs (all labs ordered are listed, but only abnormal results are displayed) Labs Reviewed  BASIC METABOLIC PANEL - Abnormal; Notable for the following components:      Result Value   Chloride 96 (*)    Glucose, Bld 340 (*)    BUN 37 (*)    Creatinine, Ser 2.54 (*)    GFR, Estimated 27 (*)    All other components within normal limits  CBC - Abnormal; Notable for the following components:   WBC 10.7 (*)    Hemoglobin 12.7 (*)    RDW 15.7 (*)    All other components within normal limits  RESP PANEL BY RT-PCR (FLU A&B, COVID) ARPGX2  BRAIN NATRIURETIC PEPTIDE  TROPONIN I (HIGH SENSITIVITY)  TROPONIN I (HIGH SENSITIVITY)     EKG  ED ECG REPORT I, Dyer Klug J, the attending physician, personally viewed and interpreted this ECG.   Date: 04/22/2022  EKG Time: 2023  Rate: 66  Rhythm: normal sinus rhythm  Axis: RAD  Intervals:first-degree A-V block   ST&T Change: Nonspecific    RADIOLOGY I have independently visualized and interpreted patient's chest x-ray as well as noted the radiology interpretation:  X-ray: No acute cardiopulmonary process   Official radiology report(s): DG Chest 2 View  Result Date: 04/21/2022 CLINICAL DATA:  Shortness of breath. EXAM: CHEST - 2 VIEW COMPARISON:  Chest radiograph dated May 17, 2021 FINDINGS: The heart size and mediastinal contours are within normal limits. Both lungs are clear. Chronic elevation of the right hemidiaphragm, unchanged. The visualized skeletal structures are unremarkable. IMPRESSION: No active cardiopulmonary disease. Electronically Signed   By: Keane Police D.O.   On: 04/21/2022 20:58     PROCEDURES:  Critical Care performed: No  .1-3 Lead EKG Interpretation  Performed by: Paulette Blanch, MD Authorized by: Paulette Blanch, MD     Interpretation: normal     ECG rate:  66   ECG rate assessment: normal     Rhythm:  sinus rhythm     Ectopy: none     Conduction: normal   Comments:  Patient placed on cardiac monitor to evaluate for arrhythmias    MEDICATIONS ORDERED IN ED: Medications  ipratropium-albuterol (DUONEB) 0.5-2.5 (3) MG/3ML nebulizer solution 3 mL (3 mLs Nebulization Given 04/22/22 0335)  methylPREDNISolone sodium succinate (SOLU-MEDROL) 125 mg/2 mL injection 125 mg (125 mg Intravenous Given 04/22/22 0335)  ipratropium-albuterol (DUONEB) 0.5-2.5 (3) MG/3ML nebulizer solution 3 mL (3 mLs Nebulization Given 04/22/22 0531)  chlorpheniramine-HYDROcodone (TUSSIONEX) 10-8 MG/5ML suspension 5 mL (5 mLs Oral Given 04/22/22 0531)  sodium chloride 0.9 % bolus 250 mL (250 mLs Intravenous New Bag/Given 04/22/22 0534)     IMPRESSION / MDM / ASSESSMENT AND PLAN / ED COURSE  I reviewed the triage vital signs and the nursing notes.                             71 year old male presenting with shortness of breath. Differential includes, but is not limited to, viral syndrome, bronchitis including COPD exacerbation, pneumonia, reactive airway disease including asthma, CHF including exacerbation with or without pulmonary/interstitial edema, pneumothorax, ACS, thoracic trauma, and pulmonary embolism.  I have personally reviewed patient's records and note a nephrology office visit on 04/14/2022 for CKD and diabetes.  Patient's presentation is most consistent with exacerbation of chronic illness.  The patient is on the cardiac monitor to evaluate for evidence of arrhythmia and/or significant heart rate changes.  Laboratory results demonstrate mild leukocytosis WBC 10.7, hyperglycemia without elevation of anion gap, CKD BUN 37/creatinine 2.54, 2 sets of negative troponins, negative BNP.  Chest x-ray negative for pneumonia or overt failure.  Respiratory panel is negative.  Will administer IV Solu-Medrol and DuoNeb for diminished aeration suggestive of COPD flare.  Will reassess.  Clinical Course as of 04/22/22 0620  Fri  Apr 22, 2022  0450 No improvement after first DuoNeb.  Room air saturation 91%, placed on 2 L nasal cannula oxygen with improvement to 95%.  Will administer second DuoNeb.  Will consult hospitalist services for evaluation and admission. [JS]  [id number] Spoke with Dr. Damita Dunnings from hospital services.  Will try second DuoNeb and ambulate on room air to assess if patient definitely requires hospitalization. [JS]  [id number] Patient had second breathing treatment and ambulated on room air.  Stayed 96% with transient drop to 88% then back to 97%.  Aeration improved.  States he feels better than when he first came in.  Wants to be discharged home rather than be admitted to the hospital.  Will discharge on prednisone burst.  Patient has nebulizer at home.  Strict return precautions given.  Patient and spouse verbalized understanding agree with plan of care. [JS]    Clinical Course User Index [JS] Paulette Blanch, MD     FINAL CLINICAL IMPRESSION(S) / ED DIAGNOSES   Final diagnoses:  COPD exacerbation ([place])     Rx / DC Orders   ED Discharge Orders     None        Note:  This document was prepared using Dragon voice recognition software and may include unintentional dictation errors.   Paulette Blanch, MD 04/22/22 [phone]

## 2022-04-22 NOTE — Discharge Instructions (Addendum)
Finish Prednisone '60mg'$  daily x4 days.  Start your next dose Saturday morning.  Use your nebulizer every 4 hours as needed for difficulty breathing.  Return to the ER for worsening symptoms, persistent vomiting, difficulty breathing or other concerns.

## 2022-04-22 NOTE — ED Notes (Signed)
Patient discharged at this time. Wheeled to lobby per request. Breathing unlabored speaking in full sentences. Verbalized understanding of all discharge, follow up, and medication teaching. Discharged homed with all belongings.

## 2022-04-23 NOTE — Telephone Encounter (Signed)
Please check exact dose with pt

## 2022-04-26 ENCOUNTER — Encounter: Payer: Self-pay | Admitting: Internal Medicine

## 2022-04-26 ENCOUNTER — Ambulatory Visit (INDEPENDENT_AMBULATORY_CARE_PROVIDER_SITE_OTHER): Payer: HMO | Admitting: Internal Medicine

## 2022-04-26 VITALS — BP 138/80 | HR 74 | Temp 98.0°F | Resp 16 | Ht 68.0 in | Wt 307.6 lb

## 2022-04-26 DIAGNOSIS — N184 Chronic kidney disease, stage 4 (severe): Secondary | ICD-10-CM

## 2022-04-26 DIAGNOSIS — E1165 Type 2 diabetes mellitus with hyperglycemia: Secondary | ICD-10-CM

## 2022-04-26 DIAGNOSIS — R0602 Shortness of breath: Secondary | ICD-10-CM

## 2022-04-26 DIAGNOSIS — J45901 Unspecified asthma with (acute) exacerbation: Secondary | ICD-10-CM | POA: Diagnosis not present

## 2022-04-26 DIAGNOSIS — N179 Acute kidney failure, unspecified: Secondary | ICD-10-CM

## 2022-04-26 DIAGNOSIS — Z6841 Body Mass Index (BMI) 40.0 and over, adult: Secondary | ICD-10-CM | POA: Diagnosis not present

## 2022-04-26 DIAGNOSIS — G4733 Obstructive sleep apnea (adult) (pediatric): Secondary | ICD-10-CM

## 2022-04-26 DIAGNOSIS — J441 Chronic obstructive pulmonary disease with (acute) exacerbation: Secondary | ICD-10-CM

## 2022-04-26 MED ORDER — ALBUTEROL SULFATE (2.5 MG/3ML) 0.083% IN NEBU: 2.5000 mg | INHALATION_SOLUTION | Freq: Four times a day (QID) | RESPIRATORY_TRACT | 1 refills | Status: DC | PRN

## 2022-04-26 NOTE — Progress Notes (Addendum)
[facility] - [place], [place] [address]  Internal MEDICINE  Office Visit Note  Patient Name: Jason Hudson  [id number]  [id number]  Date of Service: 05/01/2022  Chief Complaint  Patient presents with   Follow-up    Tight in chest and body aches, HA.  Was tested for Covid and pneumonia but negative    HPI Pt is seen for follow up  He was seen in ED on 9/1 for sob, was diagnosed with copd exacerbation, intial RA sats were low but improved after 2 albuterol breathing treatments. He was discharged with steroid taper, feels better  It was noticed that his renal functions were worse, has seen nephrology, therapy with SGLT was recommended Pt is non adherent with therapy  Some inhalers are too expansive for him to afford    Current Medication: Outpatient Encounter Medications as of 04/26/2022  Medication Sig Note   albuterol (PROVENTIL) (2.5 MG/3ML) 0.083% nebulizer solution Take 3 mLs (2.5 mg total) by nebulization every 6 (six) hours as needed for wheezing or shortness of breath.    albuterol (VENTOLIN HFA) 108 (90 Base) MCG/ACT inhaler Inhale 1-2 puffs into the lungs every 6 (six) hours as needed for wheezing or shortness of breath.    allopurinol (ZYLOPRIM) 100 MG tablet TAKE ONE TABLET (100 MG) BY MOUTH ONCE DAILY    aspirin EC 81 MG tablet Take 81 mg by mouth daily. Swallow whole.    carvedilol (COREG) 6.25 MG tablet Take 6.25 mg by mouth 2 (two) times daily with a meal. Pt gets from cardiologist    Cholecalciferol (VITAMIN D3) 50 MCG (2000 UT) TABS Take 2,000 Units by mouth at bedtime.    colchicine 0.6 MG tablet Day 1: take 2 tablets by mouth now then take 1 more tablet by mouth 1 hour later.  After day 1, take 1 tablet daily until gout flare is resolved.  May repeat process as needed for future gout flare    doxazosin (CARDURA) 4 MG tablet TAKE 1 TABLET BY MOUTH AT BEDTIME    fluticasone (FLONASE) 50 MCG/ACT nasal spray Place 2 sprays into both  nostrils 2 (two) times daily.     insulin degludec (TRESIBA) 200 UNIT/ML FlexTouch Pen Inject 120 Units into the skin daily before breakfast.    ipratropium (ATROVENT) 0.02 % nebulizer solution Take 2.5 mLs (0.5 mg total) by nebulization every 6 (six) hours as needed for wheezing or shortness of breath.    isosorbide mononitrate (IMDUR) 30 MG 24 hr tablet Take 30 mg by mouth daily.    levothyroxine (SYNTHROID) 88 MCG tablet TAKE ONE TABLET EACH MORNING BEFORE BREAKFAST    loratadine (CLARITIN) 10 MG tablet Take 10 mg by mouth daily.  08/24/2018: Takes liqui-gels at home   multivitamin (ONE-A-DAY MEN'S) TABS tablet Take 1 tablet by mouth daily.    nitroGLYCERIN (NITROSTAT) 0.4 MG SL tablet Place 1 tablet (0.4 mg total) under the tongue every 5 (five) minutes as needed for chest pain.    oxyCODONE (OXY IR/ROXICODONE) 5 MG immediate release tablet TAKE ONE TABLET BY MOUTH EVERY 6 HOURS AS NEEDED FOR SEVERE PAIN    potassium chloride (KLOR-CON) 10 MEQ tablet TAKE TWO TABLETS EVERY OTHER DAY AND 1 TABLET ON THE DAYS IN BETWEEN    predniSONE (DELTASONE) 20 MG tablet 3 tablets daily x 4 days    traZODone (DESYREL) 50 MG tablet TAKE ONE TABLET AT BEDTIME AS NEEDED FORSLEEP    TRESIBA FLEXTOUCH 100 UNIT/ML FlexTouch Pen INJECT 70  UNITS SQ TWICE A DAY    amiodarone (PACERONE) 200 MG tablet Take 1 tablet (200 mg total) by mouth daily for 30 days.    No facility-administered encounter medications on file as of 04/26/2022.    Surgical History: Past Surgical History:  Procedure Laterality Date   COLONOSCOPY WITH PROPOFOL N/A 11/07/2017   Procedure: COLONOSCOPY WITH PROPOFOL;  Surgeon: [name], MD;  Location: [facility] ENDOSCOPY;  Service: Gastroenterology;  Laterality: N/A;   ESOPHAGOGASTRODUODENOSCOPY (EGD) WITH PROPOFOL N/A 11/07/2017   Procedure: ESOPHAGOGASTRODUODENOSCOPY (EGD) WITH PROPOFOL;  Surgeon: [name], MD;  Location: [facility] ENDOSCOPY;  Service: Gastroenterology;  Laterality: N/A;   LEFT HEART CATH  AND CORONARY ANGIOGRAPHY N/A 10/23/2017   Procedure: LEFT HEART CATH AND CORONARY ANGIOGRAPHY;  Surgeon: Isaias Cowman, MD;  Location: [place] CV LAB;  Service: Cardiovascular;  Laterality: N/A;    Medical History: Past Medical History:  Diagnosis Date   CHF (congestive heart failure) (HCC)    COPD (chronic obstructive pulmonary disease) (HCC)    Diabetes mellitus without complication (HCC)    Edema    leg swelling   Gastroesophageal reflux disease 12/15/2017   Gout    Hypertension    Melena 11/05/2017   Pneumonia 07/27/2016    Family History: Family History  Problem Relation Age of Onset   Cancer Mother    CVA Father    Heart disease Father     Social History   Socioeconomic History   Marital status: Single    Spouse name: Not on file   Number of children: Not on file   Years of education: Not on file   Highest education level: Not on file  Occupational History   Not on file  Tobacco Use   Smoking status: Never   Smokeless tobacco: Never  Substance and Sexual Activity   Alcohol use: Not Currently    Comment: ocassionally   Drug use: Never   Sexual activity: Not on file  Other Topics Concern   Not on file  Social History Narrative   Not on file   Social Determinants of Health   Financial Resource Strain: Not on file  Food Insecurity: Not on file  Transportation Needs: Not on file  Physical Activity: Not on file  Stress: Not on file  Social Connections: Not on file  Intimate Partner Violence: Not on file      Review of Systems  Constitutional:  Negative for chills, fatigue and unexpected weight change.  HENT:  Negative for congestion, postnasal drip, rhinorrhea, sneezing and sore throat.   Eyes:  Negative for redness.  Respiratory:  Positive for shortness of breath. Negative for cough and chest tightness.   Cardiovascular:  Negative for chest pain and palpitations.  Gastrointestinal:  Negative for abdominal pain, constipation, diarrhea,  nausea and vomiting.  Genitourinary:  Negative for dysuria and frequency.  Musculoskeletal:  Negative for arthralgias, back pain, joint swelling and neck pain.  Skin:  Negative for rash.  Neurological: Negative.  Negative for tremors and numbness.  Hematological:  Negative for adenopathy. Does not bruise/bleed easily.  Psychiatric/Behavioral:  Negative for behavioral problems (Depression), sleep disturbance and suicidal ideas. The patient is not nervous/anxious.     Vital Signs: BP 138/80   Pulse 74   Temp 98 F (36.7 C)   Resp 16   Ht '5\' 8"'$  (1.727 m)   Wt (!) 307 lb 9.6 oz (139.5 kg)   SpO2 94%   BMI 46.77 kg/m    Physical Exam Constitutional:  Appearance: Normal appearance.  HENT:     Head: Normocephalic and atraumatic.     Nose: Nose normal.     Mouth/Throat:     Mouth: Mucous membranes are moist.     Pharynx: No posterior oropharyngeal erythema.  Eyes:     Extraocular Movements: Extraocular movements intact.     Pupils: Pupils are equal, round, and reactive to light.  Cardiovascular:     Pulses: Normal pulses.     Heart sounds: Normal heart sounds.  Pulmonary:     Effort: Pulmonary effort is normal.     Breath sounds: Wheezing present.  Neurological:     General: No focal deficit present.     Mental Status: He is alert.  Psychiatric:        Mood and Affect: Mood normal.        Behavior: Behavior normal.        Assessment/Plan:  1. Acute exacerbation of COPD with asthma ([place]) Stable, will add samples of Spiriva for now, continue neb at home  - Spirometry with Graph  2. Type 2 diabetes mellitus with hyperglycemia, without long-term current use of insulin (HCC) Previous hg a1c is 7.5, improved than before, will continue current therapy. Will need new therapy, or sliding scale   3. OSA (obstructive sleep apnea) Pt is non compliant with PAP therapy  4. BMI 45.0-49.9, adult (HCC) Pt is trying to lose wt    5. Acute renal failure superimposed on  stage 4 chronic kidney disease, unspecified acute renal failure type ([place]) Worsening renal functions , nephrology referral was done on previous visit    General Counseling: Malva Cogan understanding of the findings of todays visit and agrees with plan of treatment. I have discussed any further diagnostic evaluation that may be needed or ordered today. We also reviewed his medications today. he has been encouraged to call the office with any questions or concerns that should arise related to todays visit.    Orders Placed This Encounter  Procedures   Spirometry with Graph    Meds ordered this encounter  Medications   albuterol (PROVENTIL) (2.5 MG/3ML) 0.083% nebulizer solution    Sig: Take 3 mLs (2.5 mg total) by nebulization every 6 (six) hours as needed for wheezing or shortness of breath.    Dispense:  150 mL    Refill:  1    Total time spent:35 Minutes Time spent includes review of chart, medications, test results, and follow up plan with the patient.   [place] Controlled Substance Database was reviewed by me.   Dr Lavera Guise Internal medicine

## 2022-04-29 DIAGNOSIS — Z0289 Encounter for other administrative examinations: Secondary | ICD-10-CM | POA: Diagnosis not present

## 2022-05-03 ENCOUNTER — Other Ambulatory Visit: Payer: Self-pay | Admitting: Nurse Practitioner

## 2022-05-03 DIAGNOSIS — M1A3310 Chronic gout due to renal impairment, right wrist, without tophus (tophi): Secondary | ICD-10-CM

## 2022-05-06 ENCOUNTER — Other Ambulatory Visit: Payer: Self-pay | Admitting: Nurse Practitioner

## 2022-05-06 DIAGNOSIS — E876 Hypokalemia: Secondary | ICD-10-CM

## 2022-05-11 ENCOUNTER — Ambulatory Visit (INDEPENDENT_AMBULATORY_CARE_PROVIDER_SITE_OTHER): Payer: HMO | Admitting: Nurse Practitioner

## 2022-05-11 ENCOUNTER — Encounter: Payer: Self-pay | Admitting: Nurse Practitioner

## 2022-05-11 VITALS — BP 130/85 | HR 75 | Temp 97.5°F | Resp 16 | Ht 68.0 in | Wt 322.0 lb

## 2022-05-11 DIAGNOSIS — E1165 Type 2 diabetes mellitus with hyperglycemia: Secondary | ICD-10-CM | POA: Diagnosis not present

## 2022-05-11 DIAGNOSIS — Z23 Encounter for immunization: Secondary | ICD-10-CM | POA: Diagnosis not present

## 2022-05-11 DIAGNOSIS — N184 Chronic kidney disease, stage 4 (severe): Secondary | ICD-10-CM | POA: Diagnosis not present

## 2022-05-11 LAB — POCT GLYCOSYLATED HEMOGLOBIN (HGB A1C): Hemoglobin A1C: 9.4 % — AB (ref 4.0–5.6)

## 2022-05-11 MED ORDER — GLIPIZIDE ER 5 MG PO TB24: 5.0000 mg | ORAL_TABLET | Freq: Every day | ORAL | 2 refills | Status: DC

## 2022-05-11 NOTE — Progress Notes (Signed)
[facility], [place] [address]  Internal MEDICINE  Office Visit Note  Patient Name: Jason Hudson  [id number]  [id number]  Date of Service: 05/11/2022  Chief Complaint  Patient presents with   Diabetes    a1c   Hypertension    elevated    HPI Jason Hudson presents for a follow up visit for diabetes and hypertension.  A1c significantly increased from prior a1c, now 9.4. glucose was >300 yesterday and >400 earlier today. Very poor diet per patient, high carbs and high sugars. currently only taking tresiba 120 units daily. Has been on many different medications and insulins in the past. GLP-1 and SGLT-2 are expensive on his insurance BP controlled. previously on diltiazem, has full bottle at home, thinks he should start taking this medication again since his BP has been up more lately.  CKD stage 4 -- sees nephrology. Significant swelling and fluid retention, on torsemide 80 mg daily, and has metolazone prn  Needs flu shot Nonadherent with medication regimen at times.     Current Medication: Outpatient Encounter Medications as of 05/11/2022  Medication Sig Note   albuterol (PROVENTIL) (2.5 MG/3ML) 0.083% nebulizer solution Take 3 mLs (2.5 mg total) by nebulization every 6 (six) hours as needed for wheezing or shortness of breath.    albuterol (VENTOLIN HFA) 108 (90 Base) MCG/ACT inhaler Inhale 1-2 puffs into the lungs every 6 (six) hours as needed for wheezing or shortness of breath.    allopurinol (ZYLOPRIM) 100 MG tablet TAKE ONE TABLET (100 MG) BY MOUTH ONCE DAILY    aspirin EC 81 MG tablet Take 81 mg by mouth daily. Swallow whole.    carvedilol (COREG) 6.25 MG tablet Take 6.25 mg by mouth 2 (two) times daily with a meal. Pt gets from cardiologist    Cholecalciferol (VITAMIN D3) 50 MCG (2000 UT) TABS Take 2,000 Units by mouth at bedtime.    colchicine 0.6 MG tablet Day 1: take 2 tablets by mouth now then take 1 more tablet by mouth 1 hour later.   After day 1, take 1 tablet daily until gout flare is resolved.  May repeat process as needed for future gout flare    doxazosin (CARDURA) 4 MG tablet TAKE 1 TABLET BY MOUTH AT BEDTIME    fluticasone (FLONASE) 50 MCG/ACT nasal spray Place 2 sprays into both nostrils 2 (two) times daily.     glipiZIDE (GLUCOTROL XL) 5 MG 24 hr tablet Take 1 tablet (5 mg total) by mouth daily with breakfast.    insulin degludec (TRESIBA) 200 UNIT/ML FlexTouch Pen Inject 120 Units into the skin daily before breakfast.    ipratropium (ATROVENT) 0.02 % nebulizer solution Take 2.5 mLs (0.5 mg total) by nebulization every 6 (six) hours as needed for wheezing or shortness of breath.    isosorbide mononitrate (IMDUR) 30 MG 24 hr tablet Take 30 mg by mouth daily.    levothyroxine (SYNTHROID) 88 MCG tablet TAKE ONE TABLET EACH MORNING BEFORE BREAKFAST    loratadine (CLARITIN) 10 MG tablet Take 10 mg by mouth daily.  08/24/2018: Takes liqui-gels at home   multivitamin (ONE-A-DAY MEN'S) TABS tablet Take 1 tablet by mouth daily.    nitroGLYCERIN (NITROSTAT) 0.4 MG SL tablet Place 1 tablet (0.4 mg total) under the tongue every 5 (five) minutes as needed for chest pain.    oxyCODONE (OXY IR/ROXICODONE) 5 MG immediate release tablet TAKE ONE TABLET BY MOUTH EVERY 6 HOURS AS NEEDED FOR SEVERE PAIN  potassium chloride (KLOR-CON) 10 MEQ tablet TAKE TWO TABLETS EVERY OTHER DAY AND 1 TABLET ON THE DAYS IN BETWEEN    predniSONE (DELTASONE) 20 MG tablet 3 tablets daily x 4 days    traZODone (DESYREL) 50 MG tablet TAKE ONE TABLET AT BEDTIME AS NEEDED FORSLEEP    TRESIBA FLEXTOUCH 100 UNIT/ML FlexTouch Pen INJECT 70 UNITS SQ TWICE A DAY    amiodarone (PACERONE) 200 MG tablet Take 1 tablet (200 mg total) by mouth daily for 30 days.    metolazone (ZAROXOLYN) 5 MG tablet Take 5 mg by mouth daily.    torsemide (DEMADEX) 20 MG tablet Take 80 mg by mouth daily.    No facility-administered encounter medications on file as of 05/11/2022.     Surgical History: Past Surgical History:  Procedure Laterality Date   COLONOSCOPY WITH PROPOFOL N/A 11/07/2017   Procedure: COLONOSCOPY WITH PROPOFOL;  Surgeon: [name], MD;  Location: [facility] ENDOSCOPY;  Service: Gastroenterology;  Laterality: N/A;   ESOPHAGOGASTRODUODENOSCOPY (EGD) WITH PROPOFOL N/A 11/07/2017   Procedure: ESOPHAGOGASTRODUODENOSCOPY (EGD) WITH PROPOFOL;  Surgeon: [name], MD;  Location: [facility] ENDOSCOPY;  Service: Gastroenterology;  Laterality: N/A;   LEFT HEART CATH AND CORONARY ANGIOGRAPHY N/A 10/23/2017   Procedure: LEFT HEART CATH AND CORONARY ANGIOGRAPHY;  Surgeon: Isaias Cowman, MD;  Location: [place] CV LAB;  Service: Cardiovascular;  Laterality: N/A;    Medical History: Past Medical History:  Diagnosis Date   CHF (congestive heart failure) (HCC)    COPD (chronic obstructive pulmonary disease) (HCC)    Diabetes mellitus without complication (HCC)    Edema    leg swelling   Gastroesophageal reflux disease 12/15/2017   Gout    Hypertension    Melena 11/05/2017   Pneumonia 07/27/2016    Family History: Family History  Problem Relation Age of Onset   Cancer Mother    CVA Father    Heart disease Father     Social History   Socioeconomic History   Marital status: Single    Spouse name: Not on file   Number of children: Not on file   Years of education: Not on file   Highest education level: Not on file  Occupational History   Not on file  Tobacco Use   Smoking status: Never   Smokeless tobacco: Never  Substance and Sexual Activity   Alcohol use: Not Currently    Comment: ocassionally   Drug use: Never   Sexual activity: Not on file  Other Topics Concern   Not on file  Social History Narrative   Not on file   Social Determinants of Health   Financial Resource Strain: Not on file  Food Insecurity: Not on file  Transportation Needs: Not on file  Physical Activity: Not on file  Stress: Not on file  Social Connections: Not  on file  Intimate Partner Violence: Not on file      Review of Systems  Constitutional: Negative.  Negative for chills, fatigue and unexpected weight change.  HENT: Negative.  Negative for congestion, rhinorrhea, sneezing and sore throat.   Eyes:  Negative for redness.  Respiratory:  Positive for shortness of breath (intermittent). Negative for cough, chest tightness and wheezing.   Cardiovascular:  Positive for leg swelling (chronic bilateral lower extremity edema). Negative for chest pain and palpitations.  Gastrointestinal:  Negative for abdominal pain, constipation, diarrhea, nausea and vomiting.  Genitourinary:  Negative for dysuria and frequency.  Musculoskeletal:  Negative for arthralgias, back pain, joint swelling and neck pain.  Skin:  Negative for rash.  Neurological: Negative.  Negative for tremors and numbness.  Hematological:  Negative for adenopathy. Does not bruise/bleed easily.  Psychiatric/Behavioral:  Negative for behavioral problems (Depression), sleep disturbance and suicidal ideas. The patient is not nervous/anxious.     Vital Signs: BP 130/85 Comment: 160/90  Pulse 75   Temp (!) 97.5 F (36.4 C)   Resp 16   Ht 5' 8"  (1.727 m)   Wt (!) 322 lb (146.1 kg)   SpO2 94%   BMI 48.96 kg/m    Physical Exam Vitals reviewed.  Constitutional:      General: He is not in acute distress.    Appearance: Normal appearance. He is obese. He is not ill-appearing.  HENT:     Head: Normocephalic and atraumatic.  Eyes:     Pupils: Pupils are equal, round, and reactive to light.  Cardiovascular:     Rate and Rhythm: Normal rate and regular rhythm.  Pulmonary:     Effort: Pulmonary effort is normal. No respiratory distress.  Neurological:     Mental Status: He is alert and oriented to person, place, and time.     Cranial Nerves: No cranial nerve deficit.     Coordination: Coordination normal.     Gait: Gait normal.  Psychiatric:        Mood and Affect: Mood normal.         Behavior: Behavior normal.        Assessment/Plan: 1. Type 2 diabetes mellitus with hyperglycemia, without long-term current use of insulin (HCC) Significantly elevated a1c. Poor diet. Patient is aware of appropriate diet and lifestyle modifications and will work on this. Glipizide ER 5 mg added to regimen.  - POCT glycosylated hemoglobin (Hb A1C) - glipiZIDE (GLUCOTROL XL) 5 MG 24 hr tablet; Take 1 tablet (5 mg total) by mouth daily with breakfast.  Dispense: 30 tablet; Refill: 2  2. Chronic kidney disease, stage 4 (severe) (HCC) Followed by nephro; kidney function slightly worse on 8/31 labs -- eGFR 15 and creatinine 2.54. continue to follow up with nephrology - torsemide (DEMADEX) 20 MG tablet; Take 80 mg by mouth daily. - metolazone (ZAROXOLYN) 5 MG tablet; Take 5 mg by mouth daily.  3. Needs flu shot Flu shot administered in office today - Flu Vaccine MDCK QUAD PF   General Counseling: Yeshua verbalizes understanding of the findings of todays visit and agrees with plan of treatment. I have discussed any further diagnostic evaluation that may be needed or ordered today. We also reviewed his medications today. he has been encouraged to call the office with any questions or concerns that should arise related to todays visit.    Orders Placed This Encounter  Procedures   Flu Vaccine MDCK QUAD PF   POCT glycosylated hemoglobin (Hb A1C)    Meds ordered this encounter  Medications   glipiZIDE (GLUCOTROL XL) 5 MG 24 hr tablet    Sig: Take 1 tablet (5 mg total) by mouth daily with breakfast.    Dispense:  30 tablet    Refill:  2    New med please fill today    Return in about 3 months (around 08/10/2022) for F/U, Recheck A1C, Jayziah Bankhead PCP.   Total time spent:30 Minutes Time spent includes review of chart, medications, test results, and follow up plan with the patient.   [place] Controlled Substance Database was reviewed by me.  This patient was seen by Jonetta Osgood,  FNP-C in collaboration with Dr. Clayborn Bigness as  a part of collaborative care agreement.   Arnesha Schiraldi R. Valetta Fuller, MSN, FNP-C Internal medicine

## 2022-05-23 ENCOUNTER — Other Ambulatory Visit: Payer: Self-pay | Admitting: Nurse Practitioner

## 2022-05-23 DIAGNOSIS — M1A3310 Chronic gout due to renal impairment, right wrist, without tophus (tophi): Secondary | ICD-10-CM

## 2022-05-24 DIAGNOSIS — E1122 Type 2 diabetes mellitus with diabetic chronic kidney disease: Secondary | ICD-10-CM | POA: Diagnosis not present

## 2022-05-24 DIAGNOSIS — N184 Chronic kidney disease, stage 4 (severe): Secondary | ICD-10-CM | POA: Diagnosis not present

## 2022-05-24 DIAGNOSIS — N2581 Secondary hyperparathyroidism of renal origin: Secondary | ICD-10-CM | POA: Diagnosis not present

## 2022-05-24 DIAGNOSIS — I10 Essential (primary) hypertension: Secondary | ICD-10-CM | POA: Diagnosis not present

## 2022-05-24 DIAGNOSIS — R600 Localized edema: Secondary | ICD-10-CM | POA: Diagnosis not present

## 2022-06-06 ENCOUNTER — Ambulatory Visit: Payer: HMO | Admitting: Internal Medicine

## 2022-06-06 ENCOUNTER — Encounter: Payer: Self-pay | Admitting: Internal Medicine

## 2022-06-06 VITALS — BP 130/72 | HR 75 | Temp 98.4°F | Resp 16 | Ht 68.0 in | Wt 315.8 lb

## 2022-06-06 DIAGNOSIS — E118 Type 2 diabetes mellitus with unspecified complications: Secondary | ICD-10-CM

## 2022-06-06 DIAGNOSIS — I4819 Other persistent atrial fibrillation: Secondary | ICD-10-CM

## 2022-06-06 DIAGNOSIS — E1165 Type 2 diabetes mellitus with hyperglycemia: Secondary | ICD-10-CM

## 2022-06-06 DIAGNOSIS — I872 Venous insufficiency (chronic) (peripheral): Secondary | ICD-10-CM

## 2022-06-06 DIAGNOSIS — Z91199 Patient's noncompliance with other medical treatment and regimen due to unspecified reason: Secondary | ICD-10-CM

## 2022-06-06 DIAGNOSIS — Z6841 Body Mass Index (BMI) 40.0 and over, adult: Secondary | ICD-10-CM

## 2022-06-06 NOTE — Progress Notes (Signed)
[facility] [place], [place] [address]  Internal MEDICINE  Office Visit Note  Patient Name: Jason Hudson  [id number]  [id number]  Date of Service: 06/06/2022  Chief Complaint  Patient presents with   Follow-up   Diabetes   Gastroesophageal Reflux   Hypertension   Medication Problem    Glipizide is not helping, patient reports drinking more sweetened iced tea   Quality Metric Gaps    Tetanus and Pneumonia Vaccines    HPI Pt is seen for routine follow up His diabetic numbers are staying in the 300,continues to be non-adherent with diabetic diet,injecting over 100 units of long acting insulin with no improvement in his sugars. He admits not taking his oral antidiabetic medicine glipizide Recent lab work showed low potassium and elevated creatinine pt is also seen by nephrology. He is also on Demadex and potassium supplement. He uses zaroxolyn as needed basis when swelling is worse in legs Pt also has atrial fibrillation and low EF however does not have follow up for cardiology on regular basis. Pt also has Hx of OSA not does not use C-PAP machine.    Current Medication: Outpatient Encounter Medications as of 06/06/2022  Medication Sig Note   albuterol (PROVENTIL) (2.5 MG/3ML) 0.083% nebulizer solution Take 3 mLs (2.5 mg total) by nebulization every 6 (six) hours as needed for wheezing or shortness of breath.    albuterol (VENTOLIN HFA) 108 (90 Base) MCG/ACT inhaler Inhale 1-2 puffs into the lungs every 6 (six) hours as needed for wheezing or shortness of breath.    allopurinol (ZYLOPRIM) 100 MG tablet TAKE ONE TABLET (100 MG) BY MOUTH ONCE DAILY    aspirin EC 81 MG tablet Take 81 mg by mouth daily. Swallow whole.    carvedilol (COREG) 6.25 MG tablet Take 6.25 mg by mouth 2 (two) times daily with a meal. Pt gets from cardiologist    Cholecalciferol (VITAMIN D3) 50 MCG (2000 UT) TABS Take 2,000 Units by mouth at bedtime.    colchicine 0.6 MG  tablet Day 1: take 2 tablets by mouth now then take 1 more tablet by mouth 1 hour later.  After day 1, take 1 tablet daily until gout flare is resolved.  May repeat process as needed for future gout flare    doxazosin (CARDURA) 4 MG tablet TAKE 1 TABLET BY MOUTH AT BEDTIME    fluticasone (FLONASE) 50 MCG/ACT nasal spray Place 2 sprays into both nostrils 2 (two) times daily.     glipiZIDE (GLUCOTROL XL) 5 MG 24 hr tablet Take 1 tablet (5 mg total) by mouth daily with breakfast.    insulin degludec (TRESIBA) 200 UNIT/ML FlexTouch Pen Inject 120 Units into the skin daily before breakfast.    ipratropium (ATROVENT) 0.02 % nebulizer solution Take 2.5 mLs (0.5 mg total) by nebulization every 6 (six) hours as needed for wheezing or shortness of breath.    isosorbide mononitrate (IMDUR) 30 MG 24 hr tablet Take 30 mg by mouth daily.    levothyroxine (SYNTHROID) 88 MCG tablet TAKE ONE TABLET EACH MORNING BEFORE BREAKFAST    loratadine (CLARITIN) 10 MG tablet Take 10 mg by mouth daily.  08/24/2018: Takes liqui-gels at home   metolazone (ZAROXOLYN) 5 MG tablet Take 5 mg by mouth daily.    multivitamin (ONE-A-DAY MEN'S) TABS tablet Take 1 tablet by mouth daily.    nitroGLYCERIN (NITROSTAT) 0.4 MG SL tablet Place 1 tablet (0.4 mg total) under the tongue every 5 (five) minutes as needed  for chest pain.    oxyCODONE (OXY IR/ROXICODONE) 5 MG immediate release tablet Take one tab po qd prn for pain    potassium chloride (KLOR-CON) 10 MEQ tablet TAKE TWO TABLETS EVERY OTHER DAY AND 1 TABLET ON THE DAYS IN BETWEEN    predniSONE (DELTASONE) 20 MG tablet 3 tablets daily x 4 days    torsemide (DEMADEX) 20 MG tablet Take 80 mg by mouth daily.    traZODone (DESYREL) 50 MG tablet TAKE ONE TABLET AT BEDTIME AS NEEDED FORSLEEP    TRESIBA FLEXTOUCH 100 UNIT/ML FlexTouch Pen INJECT 70 UNITS SQ TWICE A DAY    amiodarone (PACERONE) 200 MG tablet Take 1 tablet (200 mg total) by mouth daily for 30 days.    No facility-administered  encounter medications on file as of 06/06/2022.    Surgical History: Past Surgical History:  Procedure Laterality Date   COLONOSCOPY WITH PROPOFOL N/A 11/07/2017   Procedure: COLONOSCOPY WITH PROPOFOL;  Surgeon: [name], MD;  Location: [facility] ENDOSCOPY;  Service: Gastroenterology;  Laterality: N/A;   ESOPHAGOGASTRODUODENOSCOPY (EGD) WITH PROPOFOL N/A 11/07/2017   Procedure: ESOPHAGOGASTRODUODENOSCOPY (EGD) WITH PROPOFOL;  Surgeon: [name], MD;  Location: [facility] ENDOSCOPY;  Service: Gastroenterology;  Laterality: N/A;   LEFT HEART CATH AND CORONARY ANGIOGRAPHY N/A 10/23/2017   Procedure: LEFT HEART CATH AND CORONARY ANGIOGRAPHY;  Surgeon: Isaias Cowman, MD;  Location: [place] CV LAB;  Service: Cardiovascular;  Laterality: N/A;    Medical History: Past Medical History:  Diagnosis Date   CHF (congestive heart failure) (HCC)    COPD (chronic obstructive pulmonary disease) (HCC)    Diabetes mellitus without complication (HCC)    Edema    leg swelling   Gastroesophageal reflux disease 12/15/2017   Gout    Hypertension    Melena 11/05/2017   Pneumonia 07/27/2016    Family History: Family History  Problem Relation Age of Onset   Cancer Mother    CVA Father    Heart disease Father     Social History   Socioeconomic History   Marital status: Single    Spouse name: Not on file   Number of children: Not on file   Years of education: Not on file   Highest education level: Not on file  Occupational History   Not on file  Tobacco Use   Smoking status: Never   Smokeless tobacco: Never  Substance and Sexual Activity   Alcohol use: Not Currently    Comment: ocassionally   Drug use: Never   Sexual activity: Not on file  Other Topics Concern   Not on file  Social History Narrative   Not on file   Social Determinants of Health   Financial Resource Strain: Not on file  Food Insecurity: Not on file  Transportation Needs: Not on file  Physical Activity: Not on file   Stress: Not on file  Social Connections: Not on file  Intimate Partner Violence: Not on file      Review of Systems  Constitutional:  Negative for fatigue and fever.  HENT:  Negative for congestion, mouth sores and postnasal drip.   Respiratory:  Positive for shortness of breath. Negative for cough.   Cardiovascular:  Positive for leg swelling. Negative for chest pain.  Endocrine:       Elevated blood sugar  Genitourinary:  Negative for flank pain.  Psychiatric/Behavioral: Negative.      Vital Signs: BP 130/72   Pulse 75   Temp 98.4 F (36.9 C)   Resp 16  Ht '5\' 8"'$  (1.727 m)   Wt (!) 315 lb 12.8 oz (143.2 kg)   SpO2 91%   BMI 48.02 kg/m    Physical Exam Constitutional:      Appearance: Normal appearance.  HENT:     Head: Normocephalic and atraumatic.     Nose: Nose normal.     Mouth/Throat:     Mouth: Mucous membranes are moist.     Pharynx: No posterior oropharyngeal erythema.  Eyes:     Extraocular Movements: Extraocular movements intact.     Pupils: Pupils are equal, round, and reactive to light.  Cardiovascular:     Rate and Rhythm: Rhythm irregular.     Pulses: Normal pulses.     Heart sounds: Normal heart sounds.  Pulmonary:     Effort: Pulmonary effort is normal.     Breath sounds: Normal breath sounds.  Musculoskeletal:     Right lower leg: Edema present.     Left lower leg: Edema present.     Comments: Pt has chronic skin changes,discoloration and swelling  Neurological:     General: No focal deficit present.     Mental Status: He is alert.  Psychiatric:        Mood and Affect: Mood normal.        Behavior: Behavior normal.        Assessment/Plan: 1. Poorly controlled type 2 diabetes mellitus with complication (HCC) His insulin requirement continues to be high, he was instructed  not to use more than prescribed dose of insulin Samples of Steglatro 15 mg po qd x 3 weeks is given Samples of Mounjaro 2.5 mg weekly samples given, pt might  need pt assistance with his meds  He was also instructed to continue on Glipizide at home 5 mg po qd   Diabetes Counseling:  1. Addition of ACE inh/ ARB'S for nephroprotection. 2. Diabetic foot care, prevention of complications. Podiatry consult 3. Exercise and lose weight.  4. Diabetic eye examination, Diabetic eye exam is updated  5. Monitor blood sugar closlely. nutrition counseling.  6. Sign and symptoms of hypoglycemia including shaking sweating,confusion and headaches.   2. Chronic venous stasis dermatitis of both lower extremities Chronic edema, does not wear compression socks, continue Diuretics as before   3. Persistent atrial fibrillation ([place]) Followed by cardiology, on Pacerone   4. Class 3 severe obesity due to excess calories with serious comorbidity and body mass index (BMI) of 45.0 to 49.9 in adult [facility]) Obesity Counseling: Risk Assessment: An assessment of behavioral risk factors was made today and includes lack of exercise sedentary lifestyle, lack of portion control and poor dietary habits.  Risk Modification Advice: She was counseled on portion control guidelines. Restricting daily caloric intake to 1800 ADA. The detrimental long term effects of obesity on her health and ongoing poor compliance was also discussed with the patient.     General Counseling: Malva Cogan understanding of the findings of todays visit and agrees with plan of treatment. I have discussed any further diagnostic evaluation that may be needed or ordered today. We also reviewed his medications today. he has been encouraged to call the office with any questions or concerns that should arise related to todays visit.    No orders of the defined types were placed in this encounter.   No orders of the defined types were placed in this encounter.   Total time spent:35 Minutes Time spent includes review of chart, medications, test results, and follow up plan with the patient.  [place]  Controlled Substance Database was reviewed by me.   Dr Lavera Guise Internal medicine

## 2022-06-08 ENCOUNTER — Other Ambulatory Visit: Payer: Self-pay | Admitting: Physician Assistant

## 2022-06-08 DIAGNOSIS — E1159 Type 2 diabetes mellitus with other circulatory complications: Secondary | ICD-10-CM

## 2022-06-14 ENCOUNTER — Other Ambulatory Visit: Payer: Self-pay | Admitting: Internal Medicine

## 2022-06-14 DIAGNOSIS — M1A3310 Chronic gout due to renal impairment, right wrist, without tophus (tophi): Secondary | ICD-10-CM

## 2022-06-14 NOTE — Telephone Encounter (Signed)
Pt last seen 06/06/22 and next 12/23

## 2022-06-29 ENCOUNTER — Other Ambulatory Visit
Admission: RE | Admit: 2022-06-29 | Discharge: 2022-06-29 | Disposition: A | Discharge status: Home / Self Care | Payer: HMO | Source: Ambulatory Visit | Attending: Specialist | Admitting: Specialist

## 2022-06-29 DIAGNOSIS — R0602 Shortness of breath: Secondary | ICD-10-CM | POA: Diagnosis present

## 2022-06-29 LAB — D-DIMER, QUANTITATIVE: D-Dimer, Quant: 0.7 ug/mL-FEU — ABNORMAL HIGH (ref 0.00–0.50)

## 2022-06-30 ENCOUNTER — Ambulatory Visit: Payer: HMO | Admitting: Physician Assistant

## 2022-07-01 ENCOUNTER — Other Ambulatory Visit: Payer: Self-pay | Admitting: Specialist

## 2022-07-01 ENCOUNTER — Other Ambulatory Visit (HOSPITAL_COMMUNITY): Payer: Self-pay | Admitting: Specialist

## 2022-07-01 ENCOUNTER — Other Ambulatory Visit: Payer: HMO

## 2022-07-01 DIAGNOSIS — R7989 Other specified abnormal findings of blood chemistry: Secondary | ICD-10-CM

## 2022-07-01 DIAGNOSIS — R0602 Shortness of breath: Secondary | ICD-10-CM

## 2022-07-04 ENCOUNTER — Ambulatory Visit
Admission: RE | Admit: 2022-07-04 | Discharge: 2022-07-04 | Disposition: A | Discharge status: Home / Self Care | Payer: HMO | Source: Ambulatory Visit | Attending: Specialist | Admitting: Specialist

## 2022-07-04 DIAGNOSIS — R7989 Other specified abnormal findings of blood chemistry: Secondary | ICD-10-CM | POA: Insufficient documentation

## 2022-07-04 MED ORDER — TECHNETIUM TO 99M ALBUMIN AGGREGATED
4.0000 | Freq: Once | INTRAVENOUS | Status: AC | PRN
  Administered 2022-07-04: 4.39 via INTRAVENOUS

## 2022-07-13 ENCOUNTER — Other Ambulatory Visit: Payer: Self-pay | Admitting: Nurse Practitioner

## 2022-07-13 DIAGNOSIS — E1165 Type 2 diabetes mellitus with hyperglycemia: Secondary | ICD-10-CM

## 2022-07-18 ENCOUNTER — Encounter: Payer: Self-pay | Admitting: Pharmacist

## 2022-07-18 NOTE — Progress Notes (Signed)
[facility])                                            [place] Team                                        Statin Quality Measure Assessment    07/18/2022  Avin Gibbons [facility] August 26, 1950 [id number]  I am a [facility] clinical pharmacist that reviews patients for statin quality initiatives.     Per review of chart and payor information, Mr. Mccubbin has a diagnosis of cardiovascular disease but is not currently filling a statin prescription.  This places him into the [facility] (Statin Use In Patients with Cardiovascular Disease) measure for CMS.    Patient has documented allergy to statin but no corresponding CPT codes that would exclude patient from [facility] measure. Please consider evaluating and coding for Mr. Magistro's past statin intolerance at tomorrow's office visit if clinically appropriate.  Code for past statin intolerance  (required annually)  Provider Requirements: Must asociate code during an office visit or telehealth encounter   Drug Induced Myopathy G72.0   Myalgia M79.1   Myositis, unspecified M60.9   Myopathy, unspecified G72.9   Rhabdomyolysis M62.82    Thank you for your time and consideration! Curlene Labrum, PharmD [place] Pharmacist Office: [phone]

## 2022-07-19 ENCOUNTER — Ambulatory Visit: Payer: HMO | Admitting: Internal Medicine

## 2022-08-10 ENCOUNTER — Ambulatory Visit: Payer: HMO | Admitting: Nurse Practitioner

## 2022-09-09 ENCOUNTER — Other Ambulatory Visit: Payer: Self-pay | Admitting: Nurse Practitioner

## 2022-09-09 DIAGNOSIS — M1A3310 Chronic gout due to renal impairment, right wrist, without tophus (tophi): Secondary | ICD-10-CM

## 2022-09-21 DIAGNOSIS — E1122 Type 2 diabetes mellitus with diabetic chronic kidney disease: Secondary | ICD-10-CM | POA: Diagnosis not present

## 2022-09-21 DIAGNOSIS — Z0000 Encounter for general adult medical examination without abnormal findings: Secondary | ICD-10-CM | POA: Diagnosis not present

## 2022-09-21 DIAGNOSIS — J449 Chronic obstructive pulmonary disease, unspecified: Secondary | ICD-10-CM | POA: Diagnosis not present

## 2022-09-21 DIAGNOSIS — Z125 Encounter for screening for malignant neoplasm of prostate: Secondary | ICD-10-CM | POA: Diagnosis not present

## 2022-09-21 DIAGNOSIS — R600 Localized edema: Secondary | ICD-10-CM | POA: Diagnosis not present

## 2022-09-21 DIAGNOSIS — J209 Acute bronchitis, unspecified: Secondary | ICD-10-CM | POA: Diagnosis not present

## 2022-09-21 DIAGNOSIS — N184 Chronic kidney disease, stage 4 (severe): Secondary | ICD-10-CM | POA: Diagnosis not present

## 2022-09-21 DIAGNOSIS — M545 Low back pain, unspecified: Secondary | ICD-10-CM | POA: Diagnosis not present

## 2022-09-21 DIAGNOSIS — Z03818 Encounter for observation for suspected exposure to other biological agents ruled out: Secondary | ICD-10-CM | POA: Diagnosis not present

## 2022-09-21 DIAGNOSIS — Z794 Long term (current) use of insulin: Secondary | ICD-10-CM | POA: Diagnosis not present

## 2022-09-21 DIAGNOSIS — I4819 Other persistent atrial fibrillation: Secondary | ICD-10-CM | POA: Diagnosis not present

## 2022-09-21 DIAGNOSIS — J208 Acute bronchitis due to other specified organisms: Secondary | ICD-10-CM | POA: Diagnosis not present

## 2022-09-21 DIAGNOSIS — M5416 Radiculopathy, lumbar region: Secondary | ICD-10-CM | POA: Diagnosis not present

## 2022-09-21 DIAGNOSIS — I5022 Chronic systolic (congestive) heart failure: Secondary | ICD-10-CM | POA: Diagnosis not present

## 2022-09-21 DIAGNOSIS — E039 Hypothyroidism, unspecified: Secondary | ICD-10-CM | POA: Diagnosis not present

## 2022-10-05 ENCOUNTER — Telehealth: Payer: Self-pay | Admitting: Internal Medicine

## 2022-10-05 NOTE — Telephone Encounter (Signed)
Last 3 office notes (24 pages) faxed back to [place]; [phone]

## 2022-10-07 DIAGNOSIS — E1151 Type 2 diabetes mellitus with diabetic peripheral angiopathy without gangrene: Secondary | ICD-10-CM | POA: Diagnosis not present

## 2022-10-07 DIAGNOSIS — N2581 Secondary hyperparathyroidism of renal origin: Secondary | ICD-10-CM | POA: Diagnosis not present

## 2022-10-07 DIAGNOSIS — I509 Heart failure, unspecified: Secondary | ICD-10-CM | POA: Diagnosis not present

## 2022-10-07 DIAGNOSIS — I482 Chronic atrial fibrillation, unspecified: Secondary | ICD-10-CM | POA: Diagnosis not present

## 2022-10-07 DIAGNOSIS — M459 Ankylosing spondylitis of unspecified sites in spine: Secondary | ICD-10-CM | POA: Diagnosis not present

## 2022-10-07 DIAGNOSIS — E1122 Type 2 diabetes mellitus with diabetic chronic kidney disease: Secondary | ICD-10-CM | POA: Diagnosis not present

## 2022-10-07 DIAGNOSIS — Z794 Long term (current) use of insulin: Secondary | ICD-10-CM | POA: Diagnosis not present

## 2022-10-07 DIAGNOSIS — I130 Hypertensive heart and chronic kidney disease with heart failure and stage 1 through stage 4 chronic kidney disease, or unspecified chronic kidney disease: Secondary | ICD-10-CM | POA: Diagnosis not present

## 2022-10-07 DIAGNOSIS — E113599 Type 2 diabetes mellitus with proliferative diabetic retinopathy without macular edema, unspecified eye: Secondary | ICD-10-CM | POA: Diagnosis not present

## 2022-10-07 DIAGNOSIS — Z6841 Body Mass Index (BMI) 40.0 and over, adult: Secondary | ICD-10-CM | POA: Diagnosis not present

## 2022-10-07 DIAGNOSIS — E1165 Type 2 diabetes mellitus with hyperglycemia: Secondary | ICD-10-CM | POA: Diagnosis not present

## 2022-11-01 DIAGNOSIS — I2510 Atherosclerotic heart disease of native coronary artery without angina pectoris: Secondary | ICD-10-CM | POA: Diagnosis not present

## 2022-11-01 DIAGNOSIS — N184 Chronic kidney disease, stage 4 (severe): Secondary | ICD-10-CM | POA: Diagnosis not present

## 2022-11-01 DIAGNOSIS — I480 Paroxysmal atrial fibrillation: Secondary | ICD-10-CM | POA: Diagnosis not present

## 2022-11-01 DIAGNOSIS — G4733 Obstructive sleep apnea (adult) (pediatric): Secondary | ICD-10-CM | POA: Diagnosis not present

## 2022-11-01 DIAGNOSIS — E119 Type 2 diabetes mellitus without complications: Secondary | ICD-10-CM | POA: Diagnosis not present

## 2022-11-01 DIAGNOSIS — Z794 Long term (current) use of insulin: Secondary | ICD-10-CM | POA: Diagnosis not present

## 2022-11-01 DIAGNOSIS — R600 Localized edema: Secondary | ICD-10-CM | POA: Diagnosis not present

## 2022-11-01 DIAGNOSIS — R0602 Shortness of breath: Secondary | ICD-10-CM | POA: Diagnosis not present

## 2022-11-01 DIAGNOSIS — I10 Essential (primary) hypertension: Secondary | ICD-10-CM | POA: Diagnosis not present

## 2022-11-01 DIAGNOSIS — I5032 Chronic diastolic (congestive) heart failure: Secondary | ICD-10-CM | POA: Diagnosis not present

## 2022-11-01 DIAGNOSIS — I509 Heart failure, unspecified: Secondary | ICD-10-CM | POA: Diagnosis not present

## 2022-11-03 DIAGNOSIS — J9811 Atelectasis: Secondary | ICD-10-CM | POA: Diagnosis not present

## 2022-11-03 DIAGNOSIS — J984 Other disorders of lung: Secondary | ICD-10-CM | POA: Diagnosis not present

## 2022-11-03 DIAGNOSIS — G4733 Obstructive sleep apnea (adult) (pediatric): Secondary | ICD-10-CM | POA: Diagnosis not present

## 2022-11-03 DIAGNOSIS — R059 Cough, unspecified: Secondary | ICD-10-CM | POA: Diagnosis not present

## 2022-11-03 DIAGNOSIS — R052 Subacute cough: Secondary | ICD-10-CM | POA: Diagnosis not present

## 2022-11-03 DIAGNOSIS — Z8616 Personal history of COVID-19: Secondary | ICD-10-CM | POA: Diagnosis not present

## 2022-11-10 DIAGNOSIS — I5032 Chronic diastolic (congestive) heart failure: Secondary | ICD-10-CM | POA: Diagnosis not present

## 2022-11-10 DIAGNOSIS — R0602 Shortness of breath: Secondary | ICD-10-CM | POA: Diagnosis not present

## 2022-11-22 ENCOUNTER — Other Ambulatory Visit: Payer: Self-pay | Admitting: Nurse Practitioner

## 2022-11-22 DIAGNOSIS — E1165 Type 2 diabetes mellitus with hyperglycemia: Secondary | ICD-10-CM

## 2022-11-22 DIAGNOSIS — I152 Hypertension secondary to endocrine disorders: Secondary | ICD-10-CM

## 2022-11-24 DIAGNOSIS — E1122 Type 2 diabetes mellitus with diabetic chronic kidney disease: Secondary | ICD-10-CM | POA: Diagnosis not present

## 2022-11-24 DIAGNOSIS — Z794 Long term (current) use of insulin: Secondary | ICD-10-CM | POA: Diagnosis not present

## 2022-11-24 DIAGNOSIS — N2581 Secondary hyperparathyroidism of renal origin: Secondary | ICD-10-CM | POA: Diagnosis not present

## 2022-11-24 DIAGNOSIS — N184 Chronic kidney disease, stage 4 (severe): Secondary | ICD-10-CM | POA: Diagnosis not present

## 2022-11-24 DIAGNOSIS — I482 Chronic atrial fibrillation, unspecified: Secondary | ICD-10-CM | POA: Diagnosis not present

## 2022-11-24 DIAGNOSIS — Z6841 Body Mass Index (BMI) 40.0 and over, adult: Secondary | ICD-10-CM | POA: Diagnosis not present

## 2022-11-24 DIAGNOSIS — I5030 Unspecified diastolic (congestive) heart failure: Secondary | ICD-10-CM | POA: Diagnosis not present

## 2022-11-24 DIAGNOSIS — E1151 Type 2 diabetes mellitus with diabetic peripheral angiopathy without gangrene: Secondary | ICD-10-CM | POA: Diagnosis not present

## 2022-11-24 DIAGNOSIS — E1165 Type 2 diabetes mellitus with hyperglycemia: Secondary | ICD-10-CM | POA: Diagnosis not present

## 2022-11-24 DIAGNOSIS — J449 Chronic obstructive pulmonary disease, unspecified: Secondary | ICD-10-CM | POA: Diagnosis not present

## 2022-11-24 DIAGNOSIS — I110 Hypertensive heart disease with heart failure: Secondary | ICD-10-CM | POA: Diagnosis not present

## 2022-12-12 ENCOUNTER — Other Ambulatory Visit: Payer: Self-pay | Admitting: Physician Assistant

## 2022-12-12 DIAGNOSIS — J449 Chronic obstructive pulmonary disease, unspecified: Secondary | ICD-10-CM

## 2022-12-21 DIAGNOSIS — L03119 Cellulitis of unspecified part of limb: Secondary | ICD-10-CM | POA: Diagnosis not present

## 2022-12-21 DIAGNOSIS — N184 Chronic kidney disease, stage 4 (severe): Secondary | ICD-10-CM | POA: Diagnosis not present

## 2022-12-21 DIAGNOSIS — E1122 Type 2 diabetes mellitus with diabetic chronic kidney disease: Secondary | ICD-10-CM | POA: Diagnosis not present

## 2022-12-21 DIAGNOSIS — Z1159 Encounter for screening for other viral diseases: Secondary | ICD-10-CM | POA: Diagnosis not present

## 2022-12-21 DIAGNOSIS — M545 Low back pain, unspecified: Secondary | ICD-10-CM | POA: Diagnosis not present

## 2022-12-21 DIAGNOSIS — R600 Localized edema: Secondary | ICD-10-CM | POA: Diagnosis not present

## 2022-12-21 DIAGNOSIS — Z125 Encounter for screening for malignant neoplasm of prostate: Secondary | ICD-10-CM | POA: Diagnosis not present

## 2022-12-21 DIAGNOSIS — G8929 Other chronic pain: Secondary | ICD-10-CM | POA: Diagnosis not present

## 2022-12-21 DIAGNOSIS — Z794 Long term (current) use of insulin: Secondary | ICD-10-CM | POA: Diagnosis not present

## 2022-12-21 DIAGNOSIS — E039 Hypothyroidism, unspecified: Secondary | ICD-10-CM | POA: Diagnosis not present

## 2022-12-21 DIAGNOSIS — N2581 Secondary hyperparathyroidism of renal origin: Secondary | ICD-10-CM | POA: Diagnosis not present

## 2023-01-03 ENCOUNTER — Emergency Department: Payer: HMO

## 2023-01-03 ENCOUNTER — Emergency Department
Admission: EM | Admit: 2023-01-03 | Discharge: 2023-01-03 | Disposition: A | Discharge status: Home / Self Care | Payer: HMO | Attending: Emergency Medicine | Admitting: Emergency Medicine

## 2023-01-03 ENCOUNTER — Other Ambulatory Visit: Payer: Self-pay

## 2023-01-03 DIAGNOSIS — Z1152 Encounter for screening for COVID-19: Secondary | ICD-10-CM | POA: Diagnosis not present

## 2023-01-03 DIAGNOSIS — I130 Hypertensive heart and chronic kidney disease with heart failure and stage 1 through stage 4 chronic kidney disease, or unspecified chronic kidney disease: Secondary | ICD-10-CM | POA: Diagnosis not present

## 2023-01-03 DIAGNOSIS — J441 Chronic obstructive pulmonary disease with (acute) exacerbation: Secondary | ICD-10-CM | POA: Insufficient documentation

## 2023-01-03 DIAGNOSIS — E1122 Type 2 diabetes mellitus with diabetic chronic kidney disease: Secondary | ICD-10-CM | POA: Insufficient documentation

## 2023-01-03 DIAGNOSIS — J9811 Atelectasis: Secondary | ICD-10-CM | POA: Diagnosis not present

## 2023-01-03 DIAGNOSIS — I509 Heart failure, unspecified: Secondary | ICD-10-CM | POA: Diagnosis not present

## 2023-01-03 DIAGNOSIS — R0602 Shortness of breath: Secondary | ICD-10-CM | POA: Diagnosis not present

## 2023-01-03 DIAGNOSIS — N189 Chronic kidney disease, unspecified: Secondary | ICD-10-CM | POA: Diagnosis not present

## 2023-01-03 DIAGNOSIS — R079 Chest pain, unspecified: Secondary | ICD-10-CM | POA: Diagnosis not present

## 2023-01-03 DIAGNOSIS — J449 Chronic obstructive pulmonary disease, unspecified: Secondary | ICD-10-CM

## 2023-01-03 LAB — BASIC METABOLIC PANEL
Anion gap: 14 (ref 5–15)
BUN: 46 mg/dL — ABNORMAL HIGH (ref 8–23)
CO2: 30 mmol/L (ref 22–32)
Calcium: 8.8 mg/dL — ABNORMAL LOW (ref 8.9–10.3)
Chloride: 91 mmol/L — ABNORMAL LOW (ref 98–111)
Creatinine, Ser: 2.59 mg/dL — ABNORMAL HIGH (ref 0.61–1.24)
GFR, Estimated: 26 mL/min — ABNORMAL LOW (ref >60)
Glucose, Bld: 197 mg/dL — ABNORMAL HIGH (ref 70–99)
Potassium: 3.3 mmol/L — ABNORMAL LOW (ref 3.5–5.1)
Sodium: 135 mmol/L (ref 135–145)

## 2023-01-03 LAB — CBC
HCT: 38.9 % — ABNORMAL LOW (ref 39.0–52.0)
Hemoglobin: 12.7 g/dL — ABNORMAL LOW (ref 13.0–17.0)
MCH: 31.1 pg (ref 26.0–34.0)
MCHC: 32.6 g/dL (ref 30.0–36.0)
MCV: 95.1 fL (ref 80.0–100.0)
Platelets: 191 10*3/uL (ref 150–400)
RBC: 4.09 MIL/uL — ABNORMAL LOW (ref 4.22–5.81)
RDW: 14.2 % (ref 11.5–15.5)
WBC: 8 10*3/uL (ref 4.0–10.5)
nRBC: 0 % (ref 0.0–0.2)

## 2023-01-03 LAB — TROPONIN I (HIGH SENSITIVITY): Troponin I (High Sensitivity): 12 ng/L (ref <18)

## 2023-01-03 LAB — SARS CORONAVIRUS 2 BY RT PCR: SARS Coronavirus 2 by RT PCR: NEGATIVE

## 2023-01-03 MED ORDER — ALBUTEROL SULFATE HFA 108 (90 BASE) MCG/ACT IN AERS
1.0000 | INHALATION_SPRAY | Freq: Four times a day (QID) | RESPIRATORY_TRACT | 5 refills | Status: AC | PRN
Start: 2023-01-03 — End: ?

## 2023-01-03 MED ORDER — IPRATROPIUM-ALBUTEROL 0.5-2.5 (3) MG/3ML IN SOLN
3.0000 mL | Freq: Once | RESPIRATORY_TRACT | Status: AC
  Administered 2023-01-03: 3 mL via RESPIRATORY_TRACT
  Filled 2023-01-03: qty 3

## 2023-01-03 MED ORDER — DOXYCYCLINE HYCLATE 100 MG PO CAPS: 100.0000 mg | ORAL_CAPSULE | Freq: Two times a day (BID) | ORAL | 0 refills | Status: AC

## 2023-01-03 NOTE — ED Notes (Signed)
Patient reports improvement in shortness of breath after treatment.  Able to ambulate in room.  Maintained oxygen saturations on room air.  Patient able to speak in full sentences while ambulating.

## 2023-01-03 NOTE — ED Triage Notes (Signed)
Pt to ED for cough and SOB since about 1 week. Pt 89% on triage, placed on 1L, now 91%. Pt also complains of whole body hurting and legs swollen. Pt here with girlfriend who is also SOB and coughing badly.

## 2023-01-03 NOTE — ED Provider Notes (Signed)
[facility] Provider Note    Event Date/Time   First MD Initiated Contact with Patient 01/03/23 2029     (approximate)   History   Chief Complaint Shortness of Breath   HPI  Jason Hudson is a 72 y.o. male with past medical history of hypertension, diabetes, CKD, CHF, and COPD who presents to the ED complaining of shortness of breath.  Patient reports that he has been increasingly short of breath over the past week with a cough productive of greenish sputum.  He denies any pain in his chest, does report some subjective fevers but has not taken his temperature at home.  He reports chronic swelling in his legs that is unchanged currently.  He states he has been using his rescue inhaler and nebulizer at home with partial relief.  His girlfriend has been sick with similar symptoms.      Physical Exam   Triage Vital Signs: ED Triage Vitals  Enc Vitals Group     BP 01/03/23 1813 (!) 143/69     Pulse Rate 01/03/23 1813 80     Resp 01/03/23 1813 16     Temp 01/03/23 1813 98.2 F (36.8 C)     Temp Source 01/03/23 1813 Oral     SpO2 01/03/23 1812 92 %     Weight 01/03/23 1814 (!) 325 lb (147.4 kg)     Height 01/03/23 1814 5\' 8"  (1.727 m)     Head Circumference --      Peak Flow --      Pain Score 01/03/23 1813 6     Pain Loc --      Pain Edu? --      Excl. in GC? --     Most recent vital signs: Vitals:   01/03/23 2100 01/03/23 2200  BP: 128/61 127/66  Pulse: 75 76  Resp: 17 16  Temp:    SpO2: 97% 94%    Constitutional: Alert and oriented. Eyes: Conjunctivae are normal. Head: Atraumatic. Nose: No congestion/rhinnorhea. Mouth/Throat: Mucous membranes are moist.  Cardiovascular: Normal rate, regular rhythm. Grossly normal heart sounds.  2+ radial pulses bilaterally. Respiratory: Normal respiratory effort.  No retractions. Lungs with mild end expiratory wheezing. Gastrointestinal: Soft and nontender. No distention. Musculoskeletal: No  lower extremity tenderness nor edema.  Neurologic:  Normal speech and language. No gross focal neurologic deficits are appreciated.    ED Results / Procedures / Treatments   Labs (all labs ordered are listed, but only abnormal results are displayed) Labs Reviewed  BASIC METABOLIC PANEL - Abnormal; Notable for the following components:      Result Value   Potassium 3.3 (*)    Chloride 91 (*)    Glucose, Bld 197 (*)    BUN 46 (*)    Creatinine, Ser 2.59 (*)    Calcium 8.8 (*)    GFR, Estimated 26 (*)    All other components within normal limits  CBC - Abnormal; Notable for the following components:   RBC 4.09 (*)    Hemoglobin 12.7 (*)    HCT 38.9 (*)    All other components within normal limits  SARS CORONAVIRUS 2 BY RT PCR  TROPONIN I (HIGH SENSITIVITY)     EKG  ED ECG REPORT I, Chesley Noon, the attending physician, personally viewed and interpreted this ECG.   Date: 01/03/2023  EKG Time: 17:57  Rate: 83  Rhythm: normal sinus rhythm  Axis: RAD  Intervals:none  ST&T Change: None  RADIOLOGY  Chest x-ray reviewed and interpreted by me with no infiltrate, edema, or effusion.  PROCEDURES:  Critical Care performed: No  Procedures   MEDICATIONS ORDERED IN ED: Medications  ipratropium-albuterol (DUONEB) 0.5-2.5 (3) MG/3ML nebulizer solution 3 mL (3 mLs Nebulization Given 01/03/23 2140)     IMPRESSION / MDM / ASSESSMENT AND PLAN / ED COURSE  I reviewed the triage vital signs and the nursing notes.                              72 y.o. male with past medical history of hypertension, diabetes, COPD, CHF, and CKD who presents to the ED with productive cough and increasing difficulty breathing over the past week.  Patient's presentation is most consistent with acute presentation with potential threat to life or bodily function.  Differential diagnosis includes, but is not limited to, pneumonia, bronchitis, COPD exacerbation, CHF exacerbation, COVID-19, ACS,  PE.  Patient nontoxic-appearing and in no acute distress, vital signs are unremarkable.  Patient with oxygen saturation of 92% initially in triage, placed on 2 L nasal cannula.  I have weaned off his oxygen to room air and he appears to be maintaining saturations around 95%.  Chest x-ray with atelectasis but no evidence of pneumonia or CHF exacerbation.  EKG without arrhythmia or ischemia, troponin within normal limits and I doubt ACS or PE.  Renal function stable compared to previous with no acute electrolyte abnormality and no significant anemia or leukocytosis noted.  COVID testing is negative, we will treat with DuoNeb and reassess, have patient ambulate on a pulse ox.  Patient reports feeling better following DuoNeb, was able to ambulate on a pulse ox here in the ED with minimal difficulty breathing and no desaturation.  He is appropriate for outpatient management, would benefit from a course of antibiotics given his purulent sputum production in the setting of COPD.  We will refill his rescue inhaler and he was counseled to return to the ED for new or worsening symptoms, otherwise follow-up with his PCP.  Patient agrees with plan.      FINAL CLINICAL IMPRESSION(S) / ED DIAGNOSES   Final diagnoses:  COPD exacerbation (HCC)     Rx / DC Orders   ED Discharge Orders          Ordered    albuterol (VENTOLIN HFA) 108 (90 Base) MCG/ACT inhaler  Every 6 hours PRN        01/03/23 2217    doxycycline (VIBRAMYCIN) 100 MG capsule  2 times daily        01/03/23 2217             Note:  This document was prepared using Dragon voice recognition software and may include unintentional dictation errors.   Chesley Noon, MD 01/03/23 2218

## 2023-01-09 ENCOUNTER — Ambulatory Visit: Payer: HMO | Admitting: Nurse Practitioner

## 2023-01-17 ENCOUNTER — Other Ambulatory Visit: Payer: Self-pay | Admitting: Nurse Practitioner

## 2023-01-17 DIAGNOSIS — E1165 Type 2 diabetes mellitus with hyperglycemia: Secondary | ICD-10-CM

## 2023-01-30 ENCOUNTER — Telehealth: Payer: Self-pay | Admitting: Internal Medicine

## 2023-01-30 DIAGNOSIS — I5032 Chronic diastolic (congestive) heart failure: Secondary | ICD-10-CM | POA: Diagnosis not present

## 2023-01-30 DIAGNOSIS — R0602 Shortness of breath: Secondary | ICD-10-CM | POA: Diagnosis not present

## 2023-01-30 DIAGNOSIS — R600 Localized edema: Secondary | ICD-10-CM | POA: Diagnosis not present

## 2023-01-30 DIAGNOSIS — I10 Essential (primary) hypertension: Secondary | ICD-10-CM | POA: Diagnosis not present

## 2023-01-30 DIAGNOSIS — Z6841 Body Mass Index (BMI) 40.0 and over, adult: Secondary | ICD-10-CM | POA: Diagnosis not present

## 2023-01-30 DIAGNOSIS — E859 Amyloidosis, unspecified: Secondary | ICD-10-CM | POA: Diagnosis not present

## 2023-01-30 DIAGNOSIS — I480 Paroxysmal atrial fibrillation: Secondary | ICD-10-CM | POA: Diagnosis not present

## 2023-01-30 DIAGNOSIS — E119 Type 2 diabetes mellitus without complications: Secondary | ICD-10-CM | POA: Diagnosis not present

## 2023-01-30 DIAGNOSIS — N184 Chronic kidney disease, stage 4 (severe): Secondary | ICD-10-CM | POA: Diagnosis not present

## 2023-01-30 DIAGNOSIS — G4733 Obstructive sleep apnea (adult) (pediatric): Secondary | ICD-10-CM | POA: Diagnosis not present

## 2023-01-30 DIAGNOSIS — I2510 Atherosclerotic heart disease of native coronary artery without angina pectoris: Secondary | ICD-10-CM | POA: Diagnosis not present

## 2023-01-30 NOTE — Telephone Encounter (Signed)
Self discharge-Jason Hudson

## 2023-01-31 ENCOUNTER — Other Ambulatory Visit: Payer: Self-pay | Admitting: Internal Medicine

## 2023-01-31 DIAGNOSIS — E859 Amyloidosis, unspecified: Secondary | ICD-10-CM

## 2023-02-02 DIAGNOSIS — J984 Other disorders of lung: Secondary | ICD-10-CM | POA: Diagnosis not present

## 2023-02-02 DIAGNOSIS — J4540 Moderate persistent asthma, uncomplicated: Secondary | ICD-10-CM | POA: Diagnosis not present

## 2023-02-02 DIAGNOSIS — G4733 Obstructive sleep apnea (adult) (pediatric): Secondary | ICD-10-CM | POA: Diagnosis not present

## 2023-02-02 DIAGNOSIS — R0902 Hypoxemia: Secondary | ICD-10-CM | POA: Diagnosis not present

## 2023-02-03 DIAGNOSIS — R0902 Hypoxemia: Secondary | ICD-10-CM | POA: Diagnosis not present

## 2023-02-08 DIAGNOSIS — N189 Chronic kidney disease, unspecified: Secondary | ICD-10-CM | POA: Diagnosis not present

## 2023-02-08 DIAGNOSIS — I129 Hypertensive chronic kidney disease with stage 1 through stage 4 chronic kidney disease, or unspecified chronic kidney disease: Secondary | ICD-10-CM | POA: Diagnosis not present

## 2023-02-08 DIAGNOSIS — I509 Heart failure, unspecified: Secondary | ICD-10-CM | POA: Diagnosis not present

## 2023-02-08 DIAGNOSIS — E1122 Type 2 diabetes mellitus with diabetic chronic kidney disease: Secondary | ICD-10-CM | POA: Diagnosis not present

## 2023-02-08 DIAGNOSIS — N2581 Secondary hyperparathyroidism of renal origin: Secondary | ICD-10-CM | POA: Diagnosis not present

## 2023-02-08 DIAGNOSIS — N184 Chronic kidney disease, stage 4 (severe): Secondary | ICD-10-CM | POA: Diagnosis not present

## 2023-02-08 DIAGNOSIS — R809 Proteinuria, unspecified: Secondary | ICD-10-CM | POA: Diagnosis not present

## 2023-03-05 DIAGNOSIS — R0902 Hypoxemia: Secondary | ICD-10-CM | POA: Diagnosis not present

## 2023-03-09 DIAGNOSIS — E1151 Type 2 diabetes mellitus with diabetic peripheral angiopathy without gangrene: Secondary | ICD-10-CM | POA: Diagnosis not present

## 2023-03-09 DIAGNOSIS — Z794 Long term (current) use of insulin: Secondary | ICD-10-CM | POA: Diagnosis not present

## 2023-03-09 DIAGNOSIS — E1122 Type 2 diabetes mellitus with diabetic chronic kidney disease: Secondary | ICD-10-CM | POA: Diagnosis not present

## 2023-03-09 DIAGNOSIS — I5030 Unspecified diastolic (congestive) heart failure: Secondary | ICD-10-CM | POA: Diagnosis not present

## 2023-03-09 DIAGNOSIS — N184 Chronic kidney disease, stage 4 (severe): Secondary | ICD-10-CM | POA: Diagnosis not present

## 2023-03-09 DIAGNOSIS — Z6841 Body Mass Index (BMI) 40.0 and over, adult: Secondary | ICD-10-CM | POA: Diagnosis not present

## 2023-03-09 DIAGNOSIS — E1165 Type 2 diabetes mellitus with hyperglycemia: Secondary | ICD-10-CM | POA: Diagnosis not present

## 2023-03-09 DIAGNOSIS — I110 Hypertensive heart disease with heart failure: Secondary | ICD-10-CM | POA: Diagnosis not present

## 2023-03-09 DIAGNOSIS — I482 Chronic atrial fibrillation, unspecified: Secondary | ICD-10-CM | POA: Diagnosis not present

## 2023-03-14 ENCOUNTER — Telehealth (HOSPITAL_COMMUNITY): Payer: Self-pay | Admitting: *Deleted

## 2023-03-14 NOTE — Telephone Encounter (Signed)
Reaching out to patient to offer assistance regarding upcoming cardiac imaging study; pt verbalizes understanding of appt date/time, parking situation and where to check in, and verified current allergies; name and call back number provided for further questions should they arise  Merle Prescott RN Navigator Cardiac Imaging [facility] [phone] office [phone] cell  Patient denies claustrophobia or metal. 

## 2023-03-15 ENCOUNTER — Other Ambulatory Visit: Payer: Self-pay | Admitting: Internal Medicine

## 2023-03-15 ENCOUNTER — Ambulatory Visit
Admission: RE | Admit: 2023-03-15 | Discharge: 2023-03-15 | Disposition: A | Discharge status: Home / Self Care | Payer: PPO | Source: Ambulatory Visit | Attending: Internal Medicine | Admitting: Internal Medicine

## 2023-03-15 DIAGNOSIS — E859 Amyloidosis, unspecified: Secondary | ICD-10-CM | POA: Diagnosis not present

## 2023-03-15 MED ORDER — GADOBUTROL 1 MMOL/ML IV SOLN
18.0000 mL | Freq: Once | INTRAVENOUS | Status: AC | PRN
  Administered 2023-03-15: 18 mL via INTRAVENOUS

## 2023-03-18 ENCOUNTER — Other Ambulatory Visit: Payer: Self-pay | Admitting: Nurse Practitioner

## 2023-03-18 DIAGNOSIS — M1A3310 Chronic gout due to renal impairment, right wrist, without tophus (tophi): Secondary | ICD-10-CM

## 2023-03-20 DIAGNOSIS — R809 Proteinuria, unspecified: Secondary | ICD-10-CM | POA: Diagnosis not present

## 2023-03-20 DIAGNOSIS — N189 Chronic kidney disease, unspecified: Secondary | ICD-10-CM | POA: Diagnosis not present

## 2023-03-20 DIAGNOSIS — N184 Chronic kidney disease, stage 4 (severe): Secondary | ICD-10-CM | POA: Diagnosis not present

## 2023-03-20 DIAGNOSIS — N2581 Secondary hyperparathyroidism of renal origin: Secondary | ICD-10-CM | POA: Diagnosis not present

## 2023-03-20 DIAGNOSIS — I509 Heart failure, unspecified: Secondary | ICD-10-CM | POA: Diagnosis not present

## 2023-03-20 DIAGNOSIS — E1122 Type 2 diabetes mellitus with diabetic chronic kidney disease: Secondary | ICD-10-CM | POA: Diagnosis not present

## 2023-03-20 DIAGNOSIS — I129 Hypertensive chronic kidney disease with stage 1 through stage 4 chronic kidney disease, or unspecified chronic kidney disease: Secondary | ICD-10-CM | POA: Diagnosis not present

## 2023-04-04 DIAGNOSIS — N2581 Secondary hyperparathyroidism of renal origin: Secondary | ICD-10-CM | POA: Diagnosis not present

## 2023-04-04 DIAGNOSIS — N184 Chronic kidney disease, stage 4 (severe): Secondary | ICD-10-CM | POA: Diagnosis not present

## 2023-04-04 DIAGNOSIS — N189 Chronic kidney disease, unspecified: Secondary | ICD-10-CM | POA: Diagnosis not present

## 2023-04-04 DIAGNOSIS — I129 Hypertensive chronic kidney disease with stage 1 through stage 4 chronic kidney disease, or unspecified chronic kidney disease: Secondary | ICD-10-CM | POA: Diagnosis not present

## 2023-04-04 DIAGNOSIS — R809 Proteinuria, unspecified: Secondary | ICD-10-CM | POA: Diagnosis not present

## 2023-04-04 DIAGNOSIS — E1122 Type 2 diabetes mellitus with diabetic chronic kidney disease: Secondary | ICD-10-CM | POA: Diagnosis not present

## 2023-04-05 DIAGNOSIS — R0902 Hypoxemia: Secondary | ICD-10-CM | POA: Diagnosis not present

## 2023-04-12 ENCOUNTER — Inpatient Hospital Stay: Payer: PPO

## 2023-04-12 ENCOUNTER — Inpatient Hospital Stay
Admission: EM | Admit: 2023-04-12 | Discharge: 2023-04-21 | DRG: 871 | Disposition: A | Discharge status: Skilled Nursing Facility | Payer: PPO | Attending: Internal Medicine | Admitting: Internal Medicine

## 2023-04-12 ENCOUNTER — Emergency Department: Payer: PPO

## 2023-04-12 ENCOUNTER — Other Ambulatory Visit: Payer: Self-pay

## 2023-04-12 DIAGNOSIS — N179 Acute kidney failure, unspecified: Secondary | ICD-10-CM | POA: Diagnosis present

## 2023-04-12 DIAGNOSIS — G4733 Obstructive sleep apnea (adult) (pediatric): Secondary | ICD-10-CM | POA: Diagnosis present

## 2023-04-12 DIAGNOSIS — E871 Hypo-osmolality and hyponatremia: Secondary | ICD-10-CM | POA: Diagnosis not present

## 2023-04-12 DIAGNOSIS — M7989 Other specified soft tissue disorders: Secondary | ICD-10-CM | POA: Diagnosis not present

## 2023-04-12 DIAGNOSIS — Z9102 Food additives allergy status: Secondary | ICD-10-CM

## 2023-04-12 DIAGNOSIS — I509 Heart failure, unspecified: Secondary | ICD-10-CM | POA: Diagnosis not present

## 2023-04-12 DIAGNOSIS — R0989 Other specified symptoms and signs involving the circulatory and respiratory systems: Secondary | ICD-10-CM | POA: Diagnosis not present

## 2023-04-12 DIAGNOSIS — Z6841 Body Mass Index (BMI) 40.0 and over, adult: Secondary | ICD-10-CM | POA: Diagnosis not present

## 2023-04-12 DIAGNOSIS — J9600 Acute respiratory failure, unspecified whether with hypoxia or hypercapnia: Secondary | ICD-10-CM | POA: Diagnosis not present

## 2023-04-12 DIAGNOSIS — I2510 Atherosclerotic heart disease of native coronary artery without angina pectoris: Secondary | ICD-10-CM | POA: Diagnosis present

## 2023-04-12 DIAGNOSIS — I10 Essential (primary) hypertension: Secondary | ICD-10-CM | POA: Diagnosis not present

## 2023-04-12 DIAGNOSIS — I480 Paroxysmal atrial fibrillation: Secondary | ICD-10-CM | POA: Diagnosis present

## 2023-04-12 DIAGNOSIS — N184 Chronic kidney disease, stage 4 (severe): Secondary | ICD-10-CM | POA: Diagnosis not present

## 2023-04-12 DIAGNOSIS — A412 Sepsis due to unspecified staphylococcus: Principal | ICD-10-CM | POA: Diagnosis present

## 2023-04-12 DIAGNOSIS — R2681 Unsteadiness on feet: Secondary | ICD-10-CM | POA: Diagnosis not present

## 2023-04-12 DIAGNOSIS — K219 Gastro-esophageal reflux disease without esophagitis: Secondary | ICD-10-CM | POA: Diagnosis not present

## 2023-04-12 DIAGNOSIS — Z8249 Family history of ischemic heart disease and other diseases of the circulatory system: Secondary | ICD-10-CM

## 2023-04-12 DIAGNOSIS — R0602 Shortness of breath: Secondary | ICD-10-CM | POA: Diagnosis not present

## 2023-04-12 DIAGNOSIS — E1122 Type 2 diabetes mellitus with diabetic chronic kidney disease: Secondary | ICD-10-CM | POA: Diagnosis not present

## 2023-04-12 DIAGNOSIS — M40204 Unspecified kyphosis, thoracic region: Secondary | ICD-10-CM | POA: Diagnosis not present

## 2023-04-12 DIAGNOSIS — I130 Hypertensive heart and chronic kidney disease with heart failure and stage 1 through stage 4 chronic kidney disease, or unspecified chronic kidney disease: Secondary | ICD-10-CM | POA: Diagnosis present

## 2023-04-12 DIAGNOSIS — M109 Gout, unspecified: Secondary | ICD-10-CM | POA: Diagnosis present

## 2023-04-12 DIAGNOSIS — D631 Anemia in chronic kidney disease: Secondary | ICD-10-CM | POA: Diagnosis not present

## 2023-04-12 DIAGNOSIS — J449 Chronic obstructive pulmonary disease, unspecified: Secondary | ICD-10-CM | POA: Diagnosis present

## 2023-04-12 DIAGNOSIS — R0789 Other chest pain: Secondary | ICD-10-CM | POA: Diagnosis present

## 2023-04-12 DIAGNOSIS — R739 Hyperglycemia, unspecified: Secondary | ICD-10-CM | POA: Diagnosis not present

## 2023-04-12 DIAGNOSIS — E785 Hyperlipidemia, unspecified: Secondary | ICD-10-CM | POA: Diagnosis present

## 2023-04-12 DIAGNOSIS — M6259 Muscle wasting and atrophy, not elsewhere classified, multiple sites: Secondary | ICD-10-CM | POA: Diagnosis not present

## 2023-04-12 DIAGNOSIS — A419 Sepsis, unspecified organism: Secondary | ICD-10-CM | POA: Diagnosis not present

## 2023-04-12 DIAGNOSIS — Z794 Long term (current) use of insulin: Secondary | ICD-10-CM | POA: Diagnosis not present

## 2023-04-12 DIAGNOSIS — R0689 Other abnormalities of breathing: Secondary | ICD-10-CM | POA: Diagnosis present

## 2023-04-12 DIAGNOSIS — R0902 Hypoxemia: Secondary | ICD-10-CM | POA: Diagnosis not present

## 2023-04-12 DIAGNOSIS — Z888 Allergy status to other drugs, medicaments and biological substances status: Secondary | ICD-10-CM

## 2023-04-12 DIAGNOSIS — Z7984 Long term (current) use of oral hypoglycemic drugs: Secondary | ICD-10-CM

## 2023-04-12 DIAGNOSIS — E119 Type 2 diabetes mellitus without complications: Secondary | ICD-10-CM | POA: Diagnosis not present

## 2023-04-12 DIAGNOSIS — E1169 Type 2 diabetes mellitus with other specified complication: Secondary | ICD-10-CM | POA: Diagnosis not present

## 2023-04-12 DIAGNOSIS — Z713 Dietary counseling and surveillance: Secondary | ICD-10-CM

## 2023-04-12 DIAGNOSIS — R079 Chest pain, unspecified: Secondary | ICD-10-CM | POA: Diagnosis not present

## 2023-04-12 DIAGNOSIS — Z79899 Other long term (current) drug therapy: Secondary | ICD-10-CM

## 2023-04-12 DIAGNOSIS — E039 Hypothyroidism, unspecified: Secondary | ICD-10-CM | POA: Diagnosis present

## 2023-04-12 DIAGNOSIS — N189 Chronic kidney disease, unspecified: Secondary | ICD-10-CM | POA: Diagnosis not present

## 2023-04-12 DIAGNOSIS — N400 Enlarged prostate without lower urinary tract symptoms: Secondary | ICD-10-CM | POA: Diagnosis present

## 2023-04-12 DIAGNOSIS — Z452 Encounter for adjustment and management of vascular access device: Secondary | ICD-10-CM | POA: Diagnosis not present

## 2023-04-12 DIAGNOSIS — I25119 Atherosclerotic heart disease of native coronary artery with unspecified angina pectoris: Secondary | ICD-10-CM | POA: Diagnosis not present

## 2023-04-12 DIAGNOSIS — M4624 Osteomyelitis of vertebra, thoracic region: Secondary | ICD-10-CM | POA: Diagnosis present

## 2023-04-12 DIAGNOSIS — E88819 Insulin resistance, unspecified: Secondary | ICD-10-CM | POA: Diagnosis present

## 2023-04-12 DIAGNOSIS — I5023 Acute on chronic systolic (congestive) heart failure: Secondary | ICD-10-CM | POA: Diagnosis not present

## 2023-04-12 DIAGNOSIS — M79605 Pain in left leg: Secondary | ICD-10-CM | POA: Diagnosis not present

## 2023-04-12 DIAGNOSIS — M4644 Discitis, unspecified, thoracic region: Secondary | ICD-10-CM

## 2023-04-12 DIAGNOSIS — L0390 Cellulitis, unspecified: Secondary | ICD-10-CM | POA: Diagnosis not present

## 2023-04-12 DIAGNOSIS — I5033 Acute on chronic diastolic (congestive) heart failure: Secondary | ICD-10-CM | POA: Diagnosis not present

## 2023-04-12 DIAGNOSIS — B957 Other staphylococcus as the cause of diseases classified elsewhere: Secondary | ICD-10-CM | POA: Diagnosis not present

## 2023-04-12 DIAGNOSIS — I517 Cardiomegaly: Secondary | ICD-10-CM | POA: Diagnosis not present

## 2023-04-12 DIAGNOSIS — R7881 Bacteremia: Secondary | ICD-10-CM | POA: Diagnosis not present

## 2023-04-12 DIAGNOSIS — Z8711 Personal history of peptic ulcer disease: Secondary | ICD-10-CM

## 2023-04-12 DIAGNOSIS — J441 Chronic obstructive pulmonary disease with (acute) exacerbation: Secondary | ICD-10-CM | POA: Diagnosis present

## 2023-04-12 DIAGNOSIS — M4640 Discitis, unspecified, site unspecified: Secondary | ICD-10-CM | POA: Diagnosis not present

## 2023-04-12 DIAGNOSIS — J440 Chronic obstructive pulmonary disease with acute lower respiratory infection: Secondary | ICD-10-CM | POA: Diagnosis not present

## 2023-04-12 DIAGNOSIS — Z1152 Encounter for screening for COVID-19: Secondary | ICD-10-CM

## 2023-04-12 DIAGNOSIS — E1165 Type 2 diabetes mellitus with hyperglycemia: Secondary | ICD-10-CM | POA: Diagnosis present

## 2023-04-12 DIAGNOSIS — M4804 Spinal stenosis, thoracic region: Secondary | ICD-10-CM | POA: Diagnosis not present

## 2023-04-12 DIAGNOSIS — R600 Localized edema: Secondary | ICD-10-CM | POA: Diagnosis not present

## 2023-04-12 DIAGNOSIS — D72829 Elevated white blood cell count, unspecified: Secondary | ICD-10-CM | POA: Diagnosis not present

## 2023-04-12 DIAGNOSIS — I890 Lymphedema, not elsewhere classified: Secondary | ICD-10-CM | POA: Diagnosis not present

## 2023-04-12 DIAGNOSIS — E8721 Acute metabolic acidosis: Secondary | ICD-10-CM | POA: Diagnosis not present

## 2023-04-12 DIAGNOSIS — Z7901 Long term (current) use of anticoagulants: Secondary | ICD-10-CM

## 2023-04-12 DIAGNOSIS — J9811 Atelectasis: Secondary | ICD-10-CM | POA: Diagnosis not present

## 2023-04-12 DIAGNOSIS — M4620 Osteomyelitis of vertebra, site unspecified: Principal | ICD-10-CM

## 2023-04-12 DIAGNOSIS — Z79890 Hormone replacement therapy (postmenopausal): Secondary | ICD-10-CM

## 2023-04-12 DIAGNOSIS — E569 Vitamin deficiency, unspecified: Secondary | ICD-10-CM | POA: Diagnosis not present

## 2023-04-12 DIAGNOSIS — Z91199 Patient's noncompliance with other medical treatment and regimen due to unspecified reason: Secondary | ICD-10-CM

## 2023-04-12 DIAGNOSIS — I4891 Unspecified atrial fibrillation: Secondary | ICD-10-CM | POA: Diagnosis not present

## 2023-04-12 DIAGNOSIS — G8929 Other chronic pain: Secondary | ICD-10-CM | POA: Diagnosis present

## 2023-04-12 DIAGNOSIS — E66813 Obesity, class 3: Secondary | ICD-10-CM | POA: Diagnosis present

## 2023-04-12 DIAGNOSIS — J189 Pneumonia, unspecified organism: Secondary | ICD-10-CM | POA: Diagnosis not present

## 2023-04-12 DIAGNOSIS — I5021 Acute systolic (congestive) heart failure: Secondary | ICD-10-CM | POA: Diagnosis not present

## 2023-04-12 DIAGNOSIS — R9389 Abnormal findings on diagnostic imaging of other specified body structures: Secondary | ICD-10-CM | POA: Diagnosis not present

## 2023-04-12 DIAGNOSIS — M79604 Pain in right leg: Secondary | ICD-10-CM | POA: Diagnosis not present

## 2023-04-12 DIAGNOSIS — E049 Nontoxic goiter, unspecified: Secondary | ICD-10-CM | POA: Diagnosis not present

## 2023-04-12 DIAGNOSIS — Z7982 Long term (current) use of aspirin: Secondary | ICD-10-CM

## 2023-04-12 DIAGNOSIS — R609 Edema, unspecified: Secondary | ICD-10-CM

## 2023-04-12 DIAGNOSIS — E662 Morbid (severe) obesity with alveolar hypoventilation: Secondary | ICD-10-CM | POA: Diagnosis not present

## 2023-04-12 DIAGNOSIS — I5022 Chronic systolic (congestive) heart failure: Secondary | ICD-10-CM | POA: Diagnosis not present

## 2023-04-12 LAB — CBG MONITORING, ED
Glucose-Capillary: 471 mg/dL — ABNORMAL HIGH (ref 70–99)
Glucose-Capillary: 423 mg/dL — ABNORMAL HIGH (ref 70–99)
Glucose-Capillary: 370 mg/dL — ABNORMAL HIGH (ref 70–99)

## 2023-04-12 LAB — BASIC METABOLIC PANEL
Anion gap: 15 (ref 5–15)
BUN: 44 mg/dL — ABNORMAL HIGH (ref 8–23)
CO2: 26 mmol/L (ref 22–32)
Calcium: 11.3 mg/dL — ABNORMAL HIGH (ref 8.9–10.3)
Chloride: 89 mmol/L — ABNORMAL LOW (ref 98–111)
Creatinine, Ser: 3.13 mg/dL — ABNORMAL HIGH (ref 0.61–1.24)
GFR, Estimated: 21 mL/min — ABNORMAL LOW (ref >60)
Glucose, Bld: 459 mg/dL — ABNORMAL HIGH (ref 70–99)
Potassium: 4.7 mmol/L (ref 3.5–5.1)
Sodium: 130 mmol/L — ABNORMAL LOW (ref 135–145)

## 2023-04-12 LAB — CBC
HCT: 38 % — ABNORMAL LOW (ref 39.0–52.0)
Hemoglobin: 12.5 g/dL — ABNORMAL LOW (ref 13.0–17.0)
MCH: 30.7 pg (ref 26.0–34.0)
MCHC: 32.9 g/dL (ref 30.0–36.0)
MCV: 93.4 fL (ref 80.0–100.0)
Platelets: 196 10*3/uL (ref 150–400)
RBC: 4.07 MIL/uL — ABNORMAL LOW (ref 4.22–5.81)
RDW: 13.6 % (ref 11.5–15.5)
WBC: 16.3 10*3/uL — ABNORMAL HIGH (ref 4.0–10.5)
nRBC: 0 % (ref 0.0–0.2)

## 2023-04-12 LAB — TROPONIN I (HIGH SENSITIVITY)
Troponin I (High Sensitivity): 20 ng/L — ABNORMAL HIGH (ref <18)
Troponin I (High Sensitivity): 36 ng/L — ABNORMAL HIGH (ref <18)

## 2023-04-12 LAB — CK: Total CK: 82 U/L (ref 49–397)

## 2023-04-12 LAB — URIC ACID: Uric Acid, Serum: 6.8 mg/dL (ref 3.7–8.6)

## 2023-04-12 LAB — BRAIN NATRIURETIC PEPTIDE: B Natriuretic Peptide: 89.7 pg/mL (ref 0.0–100.0)

## 2023-04-12 MED ORDER — LIDOCAINE 5 % EX PTCH
1.0000 | MEDICATED_PATCH | CUTANEOUS | Status: DC
  Administered 2023-04-12 – 2023-04-20 (×8): 1 via TRANSDERMAL
  Filled 2023-04-12 (×10): qty 1

## 2023-04-12 MED ORDER — ALLOPURINOL 100 MG PO TABS
100.0000 mg | ORAL_TABLET | Freq: Every day | ORAL | Status: DC
  Administered 2023-04-12 – 2023-04-21 (×10): 100 mg via ORAL
  Filled 2023-04-12 (×10): qty 1

## 2023-04-12 MED ORDER — INSULIN GLARGINE-YFGN 100 UNIT/ML ~~LOC~~ SOLN
60.0000 [IU] | Freq: Every day | SUBCUTANEOUS | Status: DC
  Filled 2023-04-12: qty 0.6

## 2023-04-12 MED ORDER — LINAGLIPTIN 5 MG PO TABS
5.0000 mg | ORAL_TABLET | Freq: Every day | ORAL | Status: DC
  Administered 2023-04-12 – 2023-04-21 (×10): 5 mg via ORAL
  Filled 2023-04-12 (×10): qty 1

## 2023-04-12 MED ORDER — ONDANSETRON HCL 4 MG/2ML IJ SOLN: 4.0000 mg | Freq: Four times a day (QID) | INTRAMUSCULAR | Status: DC | PRN

## 2023-04-12 MED ORDER — CARVEDILOL 6.25 MG PO TABS
6.2500 mg | ORAL_TABLET | Freq: Two times a day (BID) | ORAL | Status: DC
  Administered 2023-04-12 – 2023-04-21 (×18): 6.25 mg via ORAL
  Filled 2023-04-12 (×19): qty 1

## 2023-04-12 MED ORDER — SODIUM CHLORIDE 0.9% FLUSH: 3.0000 mL | INTRAVENOUS | Status: DC | PRN

## 2023-04-12 MED ORDER — ISOSORBIDE MONONITRATE ER 30 MG PO TB24
30.0000 mg | ORAL_TABLET | Freq: Every day | ORAL | Status: DC
  Administered 2023-04-12 – 2023-04-21 (×10): 30 mg via ORAL
  Filled 2023-04-12 (×10): qty 1

## 2023-04-12 MED ORDER — LEVOTHYROXINE SODIUM 88 MCG PO TABS
88.0000 ug | ORAL_TABLET | Freq: Every day | ORAL | Status: DC
  Administered 2023-04-13: 88 ug via ORAL
  Filled 2023-04-12 (×2): qty 1

## 2023-04-12 MED ORDER — ACETAMINOPHEN 325 MG PO TABS
650.0000 mg | ORAL_TABLET | ORAL | Status: DC | PRN
  Administered 2023-04-13 – 2023-04-21 (×8): 650 mg via ORAL
  Filled 2023-04-12 (×9): qty 2

## 2023-04-12 MED ORDER — ALBUTEROL SULFATE HFA 108 (90 BASE) MCG/ACT IN AERS: 1.0000 | INHALATION_SPRAY | Freq: Four times a day (QID) | RESPIRATORY_TRACT | Status: DC | PRN

## 2023-04-12 MED ORDER — FLUTICASONE PROPIONATE 50 MCG/ACT NA SUSP
2.0000 | Freq: Two times a day (BID) | NASAL | Status: DC
  Administered 2023-04-12 – 2023-04-21 (×14): 2 via NASAL
  Filled 2023-04-12 (×2): qty 16

## 2023-04-12 MED ORDER — INSULIN ASPART 100 UNIT/ML IJ SOLN
20.0000 [IU] | Freq: Three times a day (TID) | INTRAMUSCULAR | Status: DC
  Administered 2023-04-12 – 2023-04-13 (×4): 20 [IU] via SUBCUTANEOUS
  Filled 2023-04-12 (×5): qty 1

## 2023-04-12 MED ORDER — TRAZODONE HCL 50 MG PO TABS
50.0000 mg | ORAL_TABLET | Freq: Every evening | ORAL | Status: DC | PRN
  Administered 2023-04-14 – 2023-04-19 (×2): 50 mg via ORAL
  Filled 2023-04-12 (×2): qty 1

## 2023-04-12 MED ORDER — HEPARIN SODIUM (PORCINE) 5000 UNIT/ML IJ SOLN
5000.0000 [IU] | Freq: Two times a day (BID) | INTRAMUSCULAR | Status: DC
  Filled 2023-04-12: qty 1

## 2023-04-12 MED ORDER — INSULIN ASPART 100 UNIT/ML IJ SOLN: 0.0000 [IU] | Freq: Three times a day (TID) | INTRAMUSCULAR | Status: DC

## 2023-04-12 MED ORDER — INSULIN GLARGINE-YFGN 100 UNIT/ML ~~LOC~~ SOLN: 60.0000 [IU] | Freq: Every day | SUBCUTANEOUS | Status: DC

## 2023-04-12 MED ORDER — AMIODARONE HCL 200 MG PO TABS
200.0000 mg | ORAL_TABLET | Freq: Every day | ORAL | Status: DC
  Administered 2023-04-13 – 2023-04-21 (×9): 200 mg via ORAL
  Filled 2023-04-12 (×9): qty 1

## 2023-04-12 MED ORDER — SODIUM CHLORIDE 0.9 % IV SOLN: 250.0000 mL | INTRAVENOUS | Status: DC | PRN

## 2023-04-12 MED ORDER — FUROSEMIDE 10 MG/ML IJ SOLN
80.0000 mg | Freq: Once | INTRAMUSCULAR | Status: AC
  Administered 2023-04-12: 80 mg via INTRAVENOUS
  Filled 2023-04-12: qty 8

## 2023-04-12 MED ORDER — OXYCODONE HCL 5 MG PO TABS
5.0000 mg | ORAL_TABLET | Freq: Four times a day (QID) | ORAL | Status: DC | PRN
  Administered 2023-04-13 (×2): 5 mg via ORAL
  Filled 2023-04-12 (×2): qty 1

## 2023-04-12 MED ORDER — CYCLOBENZAPRINE HCL 10 MG PO TABS
5.0000 mg | ORAL_TABLET | Freq: Three times a day (TID) | ORAL | Status: DC | PRN
  Administered 2023-04-12 – 2023-04-13 (×2): 5 mg via ORAL
  Filled 2023-04-12 (×2): qty 1

## 2023-04-12 MED ORDER — INSULIN DEGLUDEC 200 UNIT/ML ~~LOC~~ SOPN: PEN_INJECTOR | Freq: Every day | SUBCUTANEOUS | Status: DC

## 2023-04-12 MED ORDER — FUROSEMIDE 10 MG/ML IJ SOLN
80.0000 mg | Freq: Two times a day (BID) | INTRAMUSCULAR | Status: DC
  Administered 2023-04-13: 80 mg via INTRAVENOUS
  Filled 2023-04-12: qty 8

## 2023-04-12 MED ORDER — LORATADINE 10 MG PO TABS
10.0000 mg | ORAL_TABLET | Freq: Every day | ORAL | Status: DC
  Administered 2023-04-12 – 2023-04-21 (×10): 10 mg via ORAL
  Filled 2023-04-12 (×10): qty 1

## 2023-04-12 MED ORDER — ALBUTEROL SULFATE (2.5 MG/3ML) 0.083% IN NEBU
2.5000 mg | INHALATION_SOLUTION | Freq: Four times a day (QID) | RESPIRATORY_TRACT | Status: DC | PRN
  Administered 2023-04-13 – 2023-04-14 (×2): 2.5 mg via RESPIRATORY_TRACT
  Filled 2023-04-12 (×2): qty 3

## 2023-04-12 MED ORDER — IPRATROPIUM BROMIDE 0.02 % IN SOLN: 0.5000 mg | Freq: Four times a day (QID) | RESPIRATORY_TRACT | Status: DC | PRN

## 2023-04-12 MED ORDER — DOXAZOSIN MESYLATE 4 MG PO TABS
4.0000 mg | ORAL_TABLET | Freq: Every day | ORAL | Status: DC
  Administered 2023-04-12 – 2023-04-20 (×9): 4 mg via ORAL
  Filled 2023-04-12 (×10): qty 1

## 2023-04-12 MED ORDER — INSULIN ASPART 100 UNIT/ML IJ SOLN
0.0000 [IU] | Freq: Every day | INTRAMUSCULAR | Status: DC
  Administered 2023-04-12: 5 [IU] via SUBCUTANEOUS
  Filled 2023-04-12: qty 1

## 2023-04-12 MED ORDER — SODIUM CHLORIDE 0.9% FLUSH
3.0000 mL | Freq: Two times a day (BID) | INTRAVENOUS | Status: DC
  Administered 2023-04-12 – 2023-04-21 (×16): 3 mL via INTRAVENOUS

## 2023-04-12 MED ORDER — ASPIRIN 81 MG PO TBEC
81.0000 mg | DELAYED_RELEASE_TABLET | Freq: Every day | ORAL | Status: DC
  Administered 2023-04-12 – 2023-04-21 (×10): 81 mg via ORAL
  Filled 2023-04-12 (×10): qty 1

## 2023-04-12 NOTE — ED Triage Notes (Addendum)
Pt to ED via [facility] from home. Pt reports woke up at 8am this morning and started having intermittent centralized CP. Pt also reports SOB or HA. EMS reports RA sats 90% nad placed on 3L [place]. Pt given 1 SL nitroglycerin and 324mg  ASA. Pt reports pain 4/10 currently. Pt with hx CHF, COPD, HTN and DM. Pt labored in triage. Pt BP with EMS 194/106. EMS CBG 510. CBG 471 on arrival

## 2023-04-12 NOTE — ED Notes (Signed)
Pt eating dinner tray °

## 2023-04-12 NOTE — ED Notes (Signed)
Pt in xray

## 2023-04-12 NOTE — ED Notes (Signed)
Pt c/o chest discomfort.  Repeat EKG done.  No n/v  pt is on 2 liters oxygen.  Pt in hallway sitting in wheelchair, refused lying on stretcher or bed.family with pt.

## 2023-04-12 NOTE — H&P (Signed)
History and Physical    Jason Hudson FYB:[id number] DOB: [birth year] DOA: 04/12/2023  PCP: Enid Baas, MD (Confirm with patient/family/NH records and if not entered, this has to be entered at [facility] point of entry) Patient coming from: Home  I have personally briefly reviewed patient's old medical records in [facility]  Chief Complaint: SOB, leg swelling, chest pain  HPI: Jason Hudson is a 72 y.o. male with medical history significant of chronic HFrEF with LVEF 40-45%, PAF not on anticoagulation due to history of severe GI bleed, CKD stage IV, COPD, IDDM with insulin resistance, gout, hypothyroidism, BPH, morbid obesity, presented with increasing shortness of breath and leg swelling and chest pain.  Symptoms started 2 days ago, with increasing shortness of breath and he also noticed significant increase of leg swelling over the weekend.  He increased his torsemide dosage from 40 mg daily to 50 mg daily for the last 2 days with no significant improvement.  This morning, he woke up with cramping-like chest pain, " sometimes on the left and sometimes on the right" worsening with cough and deep breath.  ED Course: Afebrile, nontachycardic blood pressure 130/80, O2 saturation 96% on 3 L.  Chest x-ray showed pulmonary congestion.  Blood work showed creatinine 3.1 compared to baseline 2.1-2.4, K4.7, WBC 16.3, hemoglobin 12.5.  Troponin first set 20, EKG showed sinus rhythm, chronic ST changes on multiple leads  Patient was given 80 mg IV Lasix x 1 in the ED.  Review of Systems: As per HPI otherwise 14 point review of systems negative.    Past Medical History:  Diagnosis Date   CHF (congestive heart failure) (HCC)    COPD (chronic obstructive pulmonary disease) (HCC)    Diabetes mellitus without complication (HCC)    Edema    leg swelling   Gastroesophageal reflux disease 12/15/2017   Gout    Hypertension    Melena 11/05/2017   Pneumonia 07/27/2016    Past  Surgical History:  Procedure Laterality Date   COLONOSCOPY WITH PROPOFOL N/A 11/07/2017   Procedure: COLONOSCOPY WITH PROPOFOL;  Surgeon: Wyline Mood, MD;  Location: [facility];  Service: Gastroenterology;  Laterality: N/A;   ESOPHAGOGASTRODUODENOSCOPY (EGD) WITH PROPOFOL N/A 11/07/2017   Procedure: ESOPHAGOGASTRODUODENOSCOPY (EGD) WITH PROPOFOL;  Surgeon: Wyline Mood, MD;  Location: [facility] ENDOSCOPY;  Service: Gastroenterology;  Laterality: N/A;   LEFT HEART CATH AND CORONARY ANGIOGRAPHY N/A 10/23/2017   Procedure: LEFT HEART CATH AND CORONARY ANGIOGRAPHY;  Surgeon: Marcina Millard, MD;  Location: [facility] INVASIVE CV LAB;  Service: Cardiovascular;  Laterality: N/A;     reports that he has never smoked. He has never used smokeless tobacco. He reports that he does not currently use alcohol. He reports that he does not use drugs.  Allergies  Allergen Reactions   Red Dye #40 (Allura Red) Anaphylaxis and Other (See Comments)    "not as allergic to it as I used to be. I can take a little of it" "not as allergic to it as I used to be. I can take a little of it" "not as allergic to it as I used to be. I can take a little of it" Other reaction(s): Not available "not as allergic to it as I used to be. I can take a little of it" "not as allergic to it as I used to be. I can take a little of it" "not as allergic to it as I used to be. I can take a little of it" "not as allergic to  it as I used to be. I can take a little of it" "not as allergic to it as I used to be. I can take a little of it" "not as allergic to it as I used to be. I can take a little of it" "not as allergic to it as I used to be. I can take a little of it"    Atorvastatin Other (See Comments)    Other reaction(s): Not available Other reaction(s): Other (See Comments)    Atorvastatin Calcium     Family History  Problem Relation Age of Onset   Cancer Mother    CVA Father    Heart disease Father      Prior to Admission  medications   Medication Sig Start Date End Date Taking? Authorizing Provider  albuterol (PROVENTIL) (2.5 MG/3ML) 0.083% nebulizer solution Take 3 mLs (2.5 mg total) by nebulization every 6 (six) hours as needed for wheezing or shortness of breath. 04/26/22   Lyndon Code, MD  albuterol (VENTOLIN HFA) 108 (90 Base) MCG/ACT inhaler Inhale 1-2 puffs into the lungs every 6 (six) hours as needed for wheezing or shortness of breath. 01/03/23   Chesley Noon, MD  allopurinol (ZYLOPRIM) 100 MG tablet TAKE ONE TABLET (100 MG) BY MOUTH ONCE DAILY 09/09/22   Sallyanne Kuster, NP  amiodarone (PACERONE) 200 MG tablet Take 1 tablet (200 mg total) by mouth daily for 30 days. 09/15/18 10/15/18  Ihor Austin, MD  aspirin EC 81 MG tablet Take 81 mg by mouth daily. Swallow whole.    [provider]  carvedilol (COREG) 6.25 MG tablet Take 6.25 mg by mouth 2 (two) times daily with a meal. Pt gets from cardiologist    [provider]  Cholecalciferol (VITAMIN D3) 50 MCG (2000 UT) TABS Take 2,000 Units by mouth at bedtime. 09/18/20   Theotis Burrow, NP  colchicine 0.6 MG tablet Day 1: take 2 tablets by mouth now then take 1 more tablet by mouth 1 hour later.  After day 1, take 1 tablet daily until gout flare is resolved.  May repeat process as needed for future gout flare 01/08/22   Sallyanne Kuster, NP  doxazosin (CARDURA) 4 MG tablet TAKE 1 TABLET BY MOUTH AT BEDTIME 11/22/22   Abernathy, Arlyss Repress, NP  fluticasone (FLONASE) 50 MCG/ACT nasal spray Place 2 sprays into both nostrils 2 (two) times daily.     [provider]  glipiZIDE (GLUCOTROL XL) 5 MG 24 hr tablet TAKE ONE TABLET EACH MORNING WITH BREAKFAST 11/22/22   Sallyanne Kuster, NP  insulin degludec (TRESIBA) 200 UNIT/ML FlexTouch Pen Inject 120 Units into the skin daily before breakfast. 11/10/21   Abernathy, Arlyss Repress, NP  ipratropium (ATROVENT) 0.02 % nebulizer solution Take 2.5 mLs (0.5 mg total) by nebulization every 6 (six) hours as needed  for wheezing or shortness of breath. 11/29/21   Sallyanne Kuster, NP  isosorbide mononitrate (IMDUR) 30 MG 24 hr tablet Take 30 mg by mouth daily. 08/13/21   [provider]  levothyroxine (SYNTHROID) 88 MCG tablet TAKE ONE TABLET EACH MORNING BEFORE BREAKFAST 03/18/22   Sallyanne Kuster, NP  loratadine (CLARITIN) 10 MG tablet Take 10 mg by mouth daily.     [provider]  metolazone (ZAROXOLYN) 5 MG tablet Take 5 mg by mouth daily. 05/05/22   [provider]  multivitamin (ONE-A-DAY MEN'S) TABS tablet Take 1 tablet by mouth daily.    [provider]  nitroGLYCERIN (NITROSTAT) 0.4 MG SL tablet Place 1 tablet (0.4  mg total) under the tongue every 5 (five) minutes as needed for chest pain. 10/23/17   Adrian Saran, MD  oxyCODONE (OXY IR/ROXICODONE) 5 MG immediate release tablet Take one tab po qd prn for pain 05/23/22   Lyndon Code, MD  potassium chloride (KLOR-CON) 10 MEQ tablet TAKE TWO TABLETS EVERY OTHER DAY AND 1 TABLET ON THE DAYS IN BETWEEN 05/06/22   Sallyanne Kuster, NP  predniSONE (DELTASONE) 20 MG tablet 3 tablets daily x 4 days 04/22/22   Irean Hong, MD  torsemide (DEMADEX) 20 MG tablet Take 80 mg by mouth daily. 03/21/22   [provider]  traZODone (DESYREL) 50 MG tablet TAKE ONE TABLET AT BEDTIME AS NEEDED FORSLEEP 08/30/21   Sallyanne Kuster, NP  TRESIBA FLEXTOUCH 100 UNIT/ML FlexTouch Pen INJECT 70 UNITS SQ TWICE A DAY 04/23/22   Sallyanne Kuster, NP    Physical Exam: Vitals:   04/12/23 1301 04/12/23 1302 04/12/23 1346 04/12/23 1541  BP: 133/82 119/76 (!) 111/55 109/65  Pulse: 98  90 74  Resp: 18  (!) 22 (!) 22  Temp: 98 F (36.7 C)     SpO2: 96%  97% 95%    Constitutional: NAD, calm, comfortable Vitals:   04/12/23 1301 04/12/23 1302 04/12/23 1346 04/12/23 1541  BP: 133/82 119/76 (!) 111/55 109/65  Pulse: 98  90 74  Resp: 18  (!) 22 (!) 22  Temp: 98 F (36.7 C)     SpO2: 96%  97% 95%   Eyes: PERRL, lids and conjunctivae  normal ENMT: Mucous membranes are moist. Posterior pharynx clear of any exudate or lesions.Normal dentition.  Neck: normal, supple, no masses, no thyromegaly Respiratory: clear to auscultation bilaterally, no wheezing, fine crackles on bilateral lower fields, increasing respiratory effort. No accessory muscle use.  Tenderness on the palpation on frontal chest wall Cardiovascular: Regular rate and rhythm, no murmurs / rubs / gallops.  Anasarca. 2+ pedal pulses. No carotid bruits.  Abdomen: no tenderness, no masses palpated. No hepatosplenomegaly. Bowel sounds positive.  Musculoskeletal: no clubbing / cyanosis. No joint deformity upper and lower extremities. Good ROM, no contractures. Normal muscle tone.  Skin: no rashes, lesions, ulcers. No induration Neurologic: CN 2-12 grossly intact. Sensation intact, DTR normal. Strength 5/5 in all 4.  Psychiatric: Normal judgment and insight. Alert and oriented x 3. Normal mood.    Labs on Admission: I have personally reviewed following labs and imaging studies  CBC: Recent Labs  Lab 04/12/23 1305  WBC 16.3*  HGB 12.5*  HCT 38.0*  MCV 93.4  PLT 196   Basic Metabolic Panel: Recent Labs  Lab 04/12/23 1305  NA 130*  K 4.7  CL 89*  CO2 26  GLUCOSE 459*  BUN 44*  CREATININE 3.13*  CALCIUM 11.3*   GFR: CrCl cannot be calculated (Unknown ideal weight.). Liver Function Tests: No results for input(s): "AST", "ALT", "ALKPHOS", "BILITOT", "PROT", "ALBUMIN" in the last 168 hours. No results for input(s): "LIPASE", "AMYLASE" in the last 168 hours. No results for input(s): "AMMONIA" in the last 168 hours. Coagulation Profile: No results for input(s): "INR", "PROTIME" in the last 168 hours. Cardiac Enzymes: No results for input(s): "CKTOTAL", "CKMB", "CKMBINDEX", "TROPONINI" in the last 168 hours. BNP (last 3 results) No results for input(s): "PROBNP" in the last 8760 hours. HbA1C: No results for input(s): "HGBA1C" in the last 72  hours. CBG: Recent Labs  Lab 04/12/23 1307  GLUCAP 471*   Lipid Profile: No results for input(s): "CHOL", "HDL", "LDLCALC", "TRIG", "CHOLHDL", "LDLDIRECT"  in the last 72 hours. Thyroid Function Tests: No results for input(s): "TSH", "T4TOTAL", "FREET4", "T3FREE", "THYROIDAB" in the last 72 hours. Anemia Panel: No results for input(s): "VITAMINB12", "FOLATE", "FERRITIN", "TIBC", "IRON", "RETICCTPCT" in the last 72 hours. Urine analysis:    Component Value Date/Time   COLORURINE YELLOW (A) 09/25/2018 2059   APPEARANCEUR Clear 01/05/2022 1324   LABSPEC 1.012 09/25/2018 2059   PHURINE 7.0 09/25/2018 2059   GLUCOSEU Negative 01/05/2022 1324   HGBUR SMALL (A) 09/25/2018 2059   BILIRUBINUR Negative 01/05/2022 1324   KETONESUR NEGATIVE 09/25/2018 2059   PROTEINUR Negative 01/05/2022 1324   PROTEINUR NEGATIVE 09/25/2018 2059   NITRITE Negative 01/05/2022 1324   NITRITE NEGATIVE 09/25/2018 2059   LEUKOCYTESUR Negative 01/05/2022 1324    Radiological Exams on Admission: DG Chest 2 View  Result Date: 04/12/2023 CLINICAL DATA:  Chest pain EXAM: CHEST - 2 VIEW COMPARISON:  X-ray 01/03/2023 FINDINGS: Under penetrated radiographs enlarged cardiopericardial silhouette. No pneumothorax or edema. No definite pleural effusion. Eventration of the right hemidiaphragm with some adjacent atelectasis or scar. IMPRESSION: Eventration of the right hemidiaphragm with some adjacent atelectasis. Enlarged cardiopericardial silhouette. Electronically Signed   By: Karen Kays M.D.   On: 04/12/2023 14:15    EKG: Independently reviewed.  Sinus rhythm, chronic ST changes on multiple leads  Assessment/Plan Principal Problem:   CHF (congestive heart failure) (HCC) Active Problems:   COPD exacerbation (HCC)   Acute exacerbation of CHF (congestive heart failure) (HCC)   Acute kidney failure, unspecified (HCC)  (please populate well all problems here in Problem List. (For example, if patient is on BP meds at  home and you resume or decide to hold them, it is a problem that needs to be her. Same for CAD, COPD, HLD and so on)  Acute on chronic HFrEF decompensation -Significant fluid overload -Clinically suspect cardiorenal syndrome -Decided to continue aggressive IV diuresis.  IV Lasix 80 mg twice daily -Echo, cardiac amyloidosis ruled out on recent cardiac MRI study -Close monitoring patient kidney function -DVT study   AKI on CKD stage IV -Clinically suspect cardiorenal syndrome, management as above -Check CK and uric acid level  Chest pain -Appears to be musculoskeletal, noncardiac -Trial of lidocaine patch and Flexeril  Leukocytosis -Probably reactive to CHF decompensation, low suspicion for active infection -Hold off antibiotics, recheck CBC tomorrow  IDDM with baseline insulin resistance and hyperglycemia -Continue Lantus 60 unit daily -NovoLog 20 units 3 times daily AC -Sliding sego -Add Januvia  PAF -In sinus rhythm, continue amiodarone -Not on systemic anticoagulation due to history of severe GI bleed -Continue aspirin  HTN -Continue Coreg, continue Imdur -Hold off metolazone given the surge of kidney function  Gout -Check uric acid level -Continue allopurinol  OSA -Continue CPAP HS  Morbid obesity -BMI>40 -Calorie control recommended  DVT prophylaxis: Heparin subcu Code Status: Full code Family Communication: None at bedside Disposition Plan: Patient is sick with CHF decompensation requiring IV diuresis, given there is also history of CKD, kidney function has to be monitored closely, which poses a significant challenge, expect more than 2 midnight hospital stay Consults called: Nephrology  Admission status: Tele admit   Emeline General MD [facility] Pager [phone]  04/12/2023, 4:13 PM

## 2023-04-12 NOTE — ED Provider Notes (Addendum)
[facility] Provider Note    Event Date/Time   First MD Initiated Contact with Patient 04/12/23 1312     (approximate)   History   Chest Pain   HPI  Jason Hudson is a 72 y.o. male with a history of hypertension, diabetes, CKD, CAD, dismal atrial fibrillation, CHF, amyloid cyst, and COPD who presents with pain, acute onset today, described as a cramping sensation also associated with tightness in the chest.  He reports increased shortness of breath over the last several days and states he can no longer lie flat.  He also has increased leg swelling bilaterally.  He denies any fever or cough.  He has no vomiting or diarrhea.  The patient states he took his morning medications and did not feel any better.  He does feel slightly better now after nitro and aspirin from EMS.   I reviewed the past medical records.  The patient's most recent outpatient encounter was with nephrology on 8/13 for follow-up of his chronic kidney disease.  He was started on telmisartan at the that time.  He was recently seen by Dr. Juliann Pares from cardiology on 6/10.  He was started on Eliquis for stroke prevention at that time.   Physical Exam   Triage Vital Signs: ED Triage Vitals  Encounter Vitals Group     BP 04/12/23 1301 133/82     Systolic BP Percentile --      Diastolic BP Percentile --      Pulse Rate 04/12/23 1301 98     Resp 04/12/23 1301 18     Temp 04/12/23 1301 98 F (36.7 C)     Temp src --      SpO2 04/12/23 1301 96 %     Weight --      Height --      Head Circumference --      Peak Flow --      Pain Score 04/12/23 1257 4     Pain Loc --      Pain Education --      Exclude from Growth Chart --     Most recent vital signs: Vitals:   04/12/23 1302 04/12/23 1346  BP: 119/76 (!) 111/55  Pulse:  90  Resp:  (!) 22  Temp:    SpO2:  97%     General: Alert and oriented, somewhat uncomfortable appearing but in no distress. CV:  Good peripheral  perfusion.  Resp:  Increased respiratory effort.  Diminished breath sounds bilaterally with no significant wheezes. Abd:  No distention.  Other:  2+ bilateral lower extremity edema.   ED Results / Procedures / Treatments   Labs (all labs ordered are listed, but only abnormal results are displayed) Labs Reviewed  BASIC METABOLIC PANEL - Abnormal; Notable for the following components:      Result Value   Sodium 130 (*)    Chloride 89 (*)    Glucose, Bld 459 (*)    BUN 44 (*)    Creatinine, Ser 3.13 (*)    Calcium 11.3 (*)    GFR, Estimated 21 (*)    All other components within normal limits  CBC - Abnormal; Notable for the following components:   WBC 16.3 (*)    RBC 4.07 (*)    Hemoglobin 12.5 (*)    HCT 38.0 (*)    All other components within normal limits  CBG MONITORING, ED - Abnormal; Notable for the following components:   Glucose-Capillary 471 (*)  All other components within normal limits  TROPONIN I (HIGH SENSITIVITY) - Abnormal; Notable for the following components:   Troponin I (High Sensitivity) 20 (*)    All other components within normal limits  BRAIN NATRIURETIC PEPTIDE  TROPONIN I (HIGH SENSITIVITY)     EKG  ED ECG REPORT I, Dionne Bucy, the attending physician, personally viewed and interpreted this ECG.  Date: 04/12/2023 EKG Time: 1306 Rate: 96 Rhythm: normal sinus rhythm QRS Axis: Right axis Intervals: normal ST/T Wave abnormalities: Nonspecific ST abnormalities Narrative Interpretation: no evidence of acute ischemia; no significant change when compared EKG of 01/03/2023    RADIOLOGY  Chest x-ray: I independently viewed and interpreted the images; there is no focal consolidation or edema   PROCEDURES:  Critical Care performed: No  Procedures   MEDICATIONS ORDERED IN ED: Medications  furosemide (LASIX) injection 80 mg (has no administration in time range)     IMPRESSION / MDM / ASSESSMENT AND PLAN / ED COURSE  I reviewed  the triage vital signs and the nursing notes.  72 year old male with PMH as noted above presents with chest pain along with increased shortness of breath, orthopnea, and bilateral lower extremity swelling.  On exam the patient demonstrates increased work of breathing.  O2 saturation was around 90% on room air so he was put on 3 L.  There is significant peripheral edema.  Differential diagnosis includes, but is not limited to, CHF exacerbation, edema related to CKD, ACS, pneumonia or other infection.  I have a low suspicion for PE given the somewhat crampy episodic pain and the lack of tachycardia and presence of bilateral edema.  Patient's presentation is most consistent with acute presentation with potential threat to life or bodily function.  The patient is on the cardiac monitor to evaluate for evidence of arrhythmia and/or significant heart rate changes.  We will obtain chest x-ray, lab workup including cardiac enzymes and BNP, give IV Lasix, and reassess.  ----------------------------------------- 2:55 PM on 04/12/2023 -----------------------------------------  Lab workup is overall reassuring so far.  Glucose is elevated but there is no evidence of DKA.  Creatinine is mildly elevated from recent baseline.  Initial troponin is 20.  BNP is 89.  I consulted Dr. Chipper Herb from the hospitalist service; based our discussion he agrees to evaluate the patient for admission.  FINAL CLINICAL IMPRESSION(S) / ED DIAGNOSES   Final diagnoses:  Chest pain, unspecified type  Shortness of breath  Edema, unspecified type     Rx / DC Orders   ED Discharge Orders     None        Note:  This document was prepared using Dragon voice recognition software and may include unintentional dictation errors.    Dionne Bucy, MD 04/12/23 1456    Dionne Bucy, MD 04/12/23 1525

## 2023-04-12 NOTE — ED Notes (Signed)
First Nurse Note: Patient to ED via [facility] from home for CP that started last PM. Patient given 1 sublingual nitroglycerin and 324 of aspirin. Initially 90% on RA, placed on 3L [place] with EMS and now at 97%.   BP 194/106 510 cbg 20 L Hand

## 2023-04-12 NOTE — ED Notes (Signed)
Fsbs 423

## 2023-04-12 NOTE — ED Notes (Signed)
Pt in hallway bed.  Pt reports sob.  Meds given.  Pt on oxygen.  Pt alert  iv in place.

## 2023-04-12 NOTE — ED Notes (Signed)
Report off to caitlin rn cpod nurse  pt moved to room 33.

## 2023-04-13 ENCOUNTER — Encounter: Payer: Self-pay | Admitting: Internal Medicine

## 2023-04-13 ENCOUNTER — Other Ambulatory Visit: Payer: Self-pay | Admitting: Radiology

## 2023-04-13 ENCOUNTER — Other Ambulatory Visit (HOSPITAL_COMMUNITY): Payer: Self-pay

## 2023-04-13 ENCOUNTER — Inpatient Hospital Stay: Payer: PPO

## 2023-04-13 ENCOUNTER — Other Ambulatory Visit: Payer: PPO

## 2023-04-13 ENCOUNTER — Inpatient Hospital Stay
Admit: 2023-04-13 | Discharge: 2023-04-13 | Disposition: A | Discharge status: Home / Self Care | Payer: PPO | Attending: Internal Medicine | Admitting: Internal Medicine

## 2023-04-13 ENCOUNTER — Other Ambulatory Visit: Payer: Self-pay

## 2023-04-13 DIAGNOSIS — G4733 Obstructive sleep apnea (adult) (pediatric): Secondary | ICD-10-CM

## 2023-04-13 DIAGNOSIS — Z794 Long term (current) use of insulin: Secondary | ICD-10-CM

## 2023-04-13 DIAGNOSIS — D72829 Elevated white blood cell count, unspecified: Secondary | ICD-10-CM

## 2023-04-13 DIAGNOSIS — R079 Chest pain, unspecified: Secondary | ICD-10-CM | POA: Diagnosis present

## 2023-04-13 DIAGNOSIS — E1122 Type 2 diabetes mellitus with diabetic chronic kidney disease: Secondary | ICD-10-CM

## 2023-04-13 DIAGNOSIS — A419 Sepsis, unspecified organism: Secondary | ICD-10-CM | POA: Diagnosis present

## 2023-04-13 DIAGNOSIS — I480 Paroxysmal atrial fibrillation: Secondary | ICD-10-CM

## 2023-04-13 DIAGNOSIS — R0602 Shortness of breath: Secondary | ICD-10-CM | POA: Diagnosis not present

## 2023-04-13 DIAGNOSIS — I10 Essential (primary) hypertension: Secondary | ICD-10-CM

## 2023-04-13 DIAGNOSIS — J449 Chronic obstructive pulmonary disease, unspecified: Secondary | ICD-10-CM

## 2023-04-13 DIAGNOSIS — I5023 Acute on chronic systolic (congestive) heart failure: Secondary | ICD-10-CM | POA: Diagnosis not present

## 2023-04-13 DIAGNOSIS — N184 Chronic kidney disease, stage 4 (severe): Secondary | ICD-10-CM

## 2023-04-13 LAB — HIV ANTIBODY (ROUTINE TESTING W REFLEX): HIV Screen 4th Generation wRfx: NONREACTIVE

## 2023-04-13 LAB — BLOOD GAS, ARTERIAL
Acid-Base Excess: 5.8 mmol/L — ABNORMAL HIGH (ref 0.0–2.0)
Bicarbonate: 32.5 mmol/L — ABNORMAL HIGH (ref 20.0–28.0)
FIO2: 0.36 %
O2 Saturation: 96.2 %
Patient temperature: 37
pCO2 arterial: 55 mmHg — ABNORMAL HIGH (ref 32–48)
pH, Arterial: 7.38 (ref 7.35–7.45)
pO2, Arterial: 79 mmHg — ABNORMAL LOW (ref 83–108)

## 2023-04-13 LAB — BASIC METABOLIC PANEL
Anion gap: 14 (ref 5–15)
BUN: 48 mg/dL — ABNORMAL HIGH (ref 8–23)
CO2: 30 mmol/L (ref 22–32)
Calcium: 12 mg/dL — ABNORMAL HIGH (ref 8.9–10.3)
Chloride: 89 mmol/L — ABNORMAL LOW (ref 98–111)
Creatinine, Ser: 3.21 mg/dL — ABNORMAL HIGH (ref 0.61–1.24)
GFR, Estimated: 20 mL/min — ABNORMAL LOW (ref >60)
Glucose, Bld: 284 mg/dL — ABNORMAL HIGH (ref 70–99)
Potassium: 4.3 mmol/L (ref 3.5–5.1)
Sodium: 133 mmol/L — ABNORMAL LOW (ref 135–145)

## 2023-04-13 LAB — HEPATIC FUNCTION PANEL
ALT: 25 U/L (ref 0–44)
AST: 31 U/L (ref 15–41)
Albumin: 3.9 g/dL (ref 3.5–5.0)
Alkaline Phosphatase: 81 U/L (ref 38–126)
Bilirubin, Direct: <0.1 mg/dL (ref 0.0–0.2)
Total Bilirubin: 0.9 mg/dL (ref 0.3–1.2)
Total Protein: 8.3 g/dL — ABNORMAL HIGH (ref 6.5–8.1)

## 2023-04-13 LAB — CBC
HCT: 40.8 % (ref 39.0–52.0)
Hemoglobin: 13.6 g/dL (ref 13.0–17.0)
MCH: 31.7 pg (ref 26.0–34.0)
MCHC: 33.3 g/dL (ref 30.0–36.0)
MCV: 95.1 fL (ref 80.0–100.0)
Platelets: 226 10*3/uL (ref 150–400)
RBC: 4.29 MIL/uL (ref 4.22–5.81)
RDW: 13.8 % (ref 11.5–15.5)
WBC: 20.6 10*3/uL — ABNORMAL HIGH (ref 4.0–10.5)
nRBC: 0 % (ref 0.0–0.2)

## 2023-04-13 LAB — D-DIMER, QUANTITATIVE: D-Dimer, Quant: 0.86 ug{FEU}/mL — ABNORMAL HIGH (ref 0.00–0.50)

## 2023-04-13 LAB — GLUCOSE, CAPILLARY: Glucose-Capillary: 172 mg/dL — ABNORMAL HIGH (ref 70–99)

## 2023-04-13 LAB — CBG MONITORING, ED
Glucose-Capillary: 289 mg/dL — ABNORMAL HIGH (ref 70–99)
Glucose-Capillary: 325 mg/dL — ABNORMAL HIGH (ref 70–99)
Glucose-Capillary: 321 mg/dL — ABNORMAL HIGH (ref 70–99)

## 2023-04-13 LAB — PROCALCITONIN: Procalcitonin: 0.36 ng/mL

## 2023-04-13 LAB — SARS CORONAVIRUS 2 BY RT PCR: SARS Coronavirus 2 by RT PCR: NEGATIVE

## 2023-04-13 LAB — HEMOGLOBIN A1C
Hgb A1c MFr Bld: 11.3 % — ABNORMAL HIGH (ref 4.8–5.6)
Mean Plasma Glucose: 277.61 mg/dL

## 2023-04-13 LAB — TROPONIN I (HIGH SENSITIVITY)
Troponin I (High Sensitivity): 43 ng/L — ABNORMAL HIGH (ref <18)
Troponin I (High Sensitivity): 32 ng/L — ABNORMAL HIGH (ref <18)

## 2023-04-13 LAB — LIPASE, BLOOD: Lipase: 28 U/L (ref 11–51)

## 2023-04-13 MED ORDER — DICYCLOMINE HCL 10 MG/5ML PO SOLN
10.0000 mg | Freq: Once | ORAL | Status: DC
  Filled 2023-04-13: qty 5

## 2023-04-13 MED ORDER — PANTOPRAZOLE SODIUM 40 MG IV SOLR
40.0000 mg | Freq: Once | INTRAVENOUS | Status: AC
  Administered 2023-04-13: 40 mg via INTRAVENOUS
  Filled 2023-04-13: qty 10

## 2023-04-13 MED ORDER — INSULIN GLARGINE-YFGN 100 UNIT/ML ~~LOC~~ SOLN
35.0000 [IU] | Freq: Two times a day (BID) | SUBCUTANEOUS | Status: DC
  Administered 2023-04-13 – 2023-04-16 (×7): 35 [IU] via SUBCUTANEOUS
  Filled 2023-04-13 (×8): qty 0.35

## 2023-04-13 MED ORDER — OXYCODONE HCL 5 MG PO TABS
10.0000 mg | ORAL_TABLET | Freq: Four times a day (QID) | ORAL | Status: DC | PRN
  Administered 2023-04-13 – 2023-04-21 (×17): 10 mg via ORAL
  Filled 2023-04-13 (×17): qty 2

## 2023-04-13 MED ORDER — ENOXAPARIN SODIUM 150 MG/ML IJ SOSY
150.0000 mg | PREFILLED_SYRINGE | Freq: Once | INTRAMUSCULAR | Status: DC
  Filled 2023-04-13: qty 1

## 2023-04-13 MED ORDER — HEPARIN SODIUM (PORCINE) 5000 UNIT/ML IJ SOLN: 5000.0000 [IU] | Freq: Two times a day (BID) | INTRAMUSCULAR | Status: DC

## 2023-04-13 MED ORDER — CYCLOBENZAPRINE HCL 10 MG PO TABS
10.0000 mg | ORAL_TABLET | Freq: Three times a day (TID) | ORAL | Status: DC | PRN
  Administered 2023-04-13 – 2023-04-21 (×10): 10 mg via ORAL
  Filled 2023-04-13 (×11): qty 1

## 2023-04-13 MED ORDER — INSULIN GLARGINE-YFGN 100 UNIT/ML ~~LOC~~ SOLN
60.0000 [IU] | Freq: Every day | SUBCUTANEOUS | Status: DC
  Administered 2023-04-13: 60 [IU] via SUBCUTANEOUS
  Filled 2023-04-13 (×2): qty 0.6

## 2023-04-13 MED ORDER — EMPAGLIFLOZIN 25 MG PO TABS
25.0000 mg | ORAL_TABLET | Freq: Every day | ORAL | Status: DC
  Administered 2023-04-13: 25 mg via ORAL
  Filled 2023-04-13 (×3): qty 1

## 2023-04-13 MED ORDER — ALUM & MAG HYDROXIDE-SIMETH 200-200-20 MG/5ML PO SUSP
30.0000 mL | Freq: Once | ORAL | Status: AC
  Administered 2023-04-13: 30 mL via ORAL
  Filled 2023-04-13: qty 30

## 2023-04-13 MED ORDER — LORAZEPAM 2 MG PO TABS
2.0000 mg | ORAL_TABLET | Freq: Four times a day (QID) | ORAL | Status: DC | PRN
  Administered 2023-04-13 – 2023-04-15 (×3): 2 mg via ORAL
  Filled 2023-04-13 (×4): qty 1

## 2023-04-13 MED ORDER — HYOSCYAMINE SULFATE 0.125 MG SL SUBL
0.2500 mg | SUBLINGUAL_TABLET | Freq: Once | SUBLINGUAL | Status: AC
  Administered 2023-04-13: 0.25 mg via SUBLINGUAL
  Filled 2023-04-13: qty 2

## 2023-04-13 MED ORDER — INSULIN ASPART 100 UNIT/ML IJ SOLN
0.0000 [IU] | Freq: Three times a day (TID) | INTRAMUSCULAR | Status: DC
  Administered 2023-04-13: 15 [IU] via SUBCUTANEOUS
  Administered 2023-04-14: 4 [IU] via SUBCUTANEOUS
  Administered 2023-04-14: 3 [IU] via SUBCUTANEOUS
  Administered 2023-04-14: 4 [IU] via SUBCUTANEOUS
  Administered 2023-04-15 – 2023-04-16 (×2): 3 [IU] via SUBCUTANEOUS
  Administered 2023-04-16 – 2023-04-18 (×6): 4 [IU] via SUBCUTANEOUS
  Administered 2023-04-19 (×2): 3 [IU] via SUBCUTANEOUS
  Administered 2023-04-20: 4 [IU] via SUBCUTANEOUS
  Administered 2023-04-20 – 2023-04-21 (×3): 3 [IU] via SUBCUTANEOUS
  Filled 2023-04-13 (×17): qty 1

## 2023-04-13 MED ORDER — MORPHINE SULFATE (PF) 2 MG/ML IV SOLN
2.0000 mg | INTRAVENOUS | Status: DC | PRN
  Administered 2023-04-13 – 2023-04-18 (×10): 2 mg via INTRAVENOUS
  Filled 2023-04-13 (×11): qty 1

## 2023-04-13 MED ORDER — SODIUM CHLORIDE 0.9 % IV SOLN
500.0000 mg | Freq: Every day | INTRAVENOUS | Status: DC
  Administered 2023-04-13 – 2023-04-16 (×4): 500 mg via INTRAVENOUS
  Filled 2023-04-13 (×5): qty 5

## 2023-04-13 MED ORDER — LIVING WELL WITH DIABETES BOOK
Freq: Once | Status: DC
  Filled 2023-04-13: qty 1

## 2023-04-13 MED ORDER — ENOXAPARIN SODIUM 150 MG/ML IJ SOSY
1.0000 mg/kg | PREFILLED_SYRINGE | Freq: Two times a day (BID) | INTRAMUSCULAR | Status: DC
  Administered 2023-04-13 – 2023-04-14 (×2): 144 mg via SUBCUTANEOUS
  Filled 2023-04-13 (×2): qty 0.96

## 2023-04-13 MED ORDER — FUROSEMIDE 10 MG/ML IJ SOLN
80.0000 mg | Freq: Once | INTRAMUSCULAR | Status: AC
  Administered 2023-04-13: 80 mg via INTRAVENOUS
  Filled 2023-04-13: qty 8

## 2023-04-13 MED ORDER — SODIUM CHLORIDE 0.9 % IV SOLN
2.0000 g | Freq: Every day | INTRAVENOUS | Status: DC
  Administered 2023-04-13: 2 g via INTRAVENOUS
  Filled 2023-04-13 (×2): qty 20

## 2023-04-13 MED ORDER — CALCITRIOL 0.25 MCG PO CAPS
0.2500 ug | ORAL_CAPSULE | Freq: Every day | ORAL | Status: DC
  Administered 2023-04-13: 0.25 ug via ORAL
  Filled 2023-04-13 (×3): qty 1

## 2023-04-13 MED ORDER — DICYCLOMINE HCL 10 MG/5ML PO SOLN
10.0000 mg | Freq: Once | ORAL | Status: AC
  Administered 2023-04-13: 10 mg via ORAL
  Filled 2023-04-13: qty 5

## 2023-04-13 MED ORDER — LEVOTHYROXINE SODIUM 112 MCG PO TABS
112.0000 ug | ORAL_TABLET | Freq: Every day | ORAL | Status: DC
  Administered 2023-04-14 – 2023-04-21 (×8): 112 ug via ORAL
  Filled 2023-04-13 (×9): qty 1

## 2023-04-13 MED ORDER — LORAZEPAM 2 MG/ML IJ SOLN
2.0000 mg | Freq: Once | INTRAMUSCULAR | Status: AC
  Administered 2023-04-13: 2 mg via INTRAVENOUS
  Filled 2023-04-13: qty 1

## 2023-04-13 MED ORDER — FLUTICASONE FUROATE-VILANTEROL 200-25 MCG/ACT IN AEPB
1.0000 | INHALATION_SPRAY | Freq: Every day | RESPIRATORY_TRACT | Status: DC
  Administered 2023-04-13 – 2023-04-21 (×9): 1 via RESPIRATORY_TRACT
  Filled 2023-04-13: qty 28

## 2023-04-13 NOTE — Assessment & Plan Note (Signed)
Patient has atypical bandlike chest spasms, increased with any body movement and deep breathing.  Mildly positive troponin likely secondary to demand. -Echocardiogram ordered-pending

## 2023-04-13 NOTE — Assessment & Plan Note (Signed)
No wheezing.  Slightly increased cough from his baseline. -Continue home bronchodilators

## 2023-04-13 NOTE — Assessment & Plan Note (Signed)
Worsening leukocytosis.  Procalcitonin at 0.36. Chest x-ray with questionable infiltrate/atelectasis. -Started him on ceftriaxone and Zithromax for CAP coverage. -Monitor CBC

## 2023-04-13 NOTE — Progress Notes (Signed)
Patient arrived from ED with wife at bedside. He verbalized he was in severe pain in his upper back. Screaming and moaning to get oob.  Helped pt to chair postions. Administed pain medication , muscle relaxer and repositioned. Informed md of severe pain. New orders for increased medications placed. Pt placed in supine position. After 30 min pt began to rest. Wil cont to monitor.

## 2023-04-13 NOTE — Assessment & Plan Note (Signed)
Not on any anticoagulation due to history of GI bleed. -Continue amiodarone and aspirin

## 2023-04-13 NOTE — Progress Notes (Signed)
Progress Note   [name] YQM:[id number] DOB: 1951/03/02 DOA: 04/12/2023     1 DOS: the patient was seen and examined on 04/13/2023   Brief hospital course: Taken from H&P.  Kaveon Ito is a 72 y.o. male with medical history significant of chronic HFrEF with LVEF 40-45%, PAF not on anticoagulation due to history of severe GI bleed, CKD stage IV, COPD, IDDM with insulin resistance, gout, hypothyroidism, BPH, morbid obesity, presented with increasing shortness of breath and leg swelling and chest pain.   On presentation patient was hemodynamically stable.  Chest x-ray with eventration of right hemidiaphragm with some adjacent atelectasis.  No pulmonary edema.  Labs pertinent for AKI with creatinine at 3.1, baseline seems to be around 2.1-2.4, leukocytosis at 16.3.  Troponin 20 EKG sinus rhythm, chronic ST changes on multiple leads.  Patient was given 80 mg of IV Lasix in ED and started on IV diuresis. Chest pain appears to be musculoskeletal so he was given a trial of lidocaine patch and Flexeril.  8/22: Overnight persistent bandlike chest pain, which increased with body movements and deep breathing, D-dimer was checked and found to be elevated, patient also has CKD stage IV, VQ scan was ordered by night on-call. Repeat troponin 43, A1c 11.3, slight worsening of renal function with BUN 48 and creatinine 3.21, worsening leukocytosis at 20.6.  BNP normal. Holding IV Lasix today, procalcitonin 0.36, COVID PCR negative.  Significant orthopnea and unable to lay down for any scan.  [facility] cardiology was consulted.  Starting him on ceftriaxone and Zithromax for CAP coverage.         Assessment and Plan: * Shortness of breath Patient with persistent shortness of breath although saturating well on 2 L of oxygen, uses intermittent oxygen at home.  Keeps saying that I cannot breathe.  No wheezing, chest x-ray with no pulmonary edema. BNP normal so less likely a CHF  exacerbation. Significant leukocytosis and procalcitonin 0.36.  Pleuritic chest pain. Early pneumonia versus PE is on differential.  D-dimer elevated but patient also has CKD stage IV so that was inconclusive. Unable to obtain VQ scan-as patient cannot lay down. Initially refused therapeutic Lovenox due to concern of GI bleed and later agrees. -Obtain ABG to rule out any occult hypoxia -Therapeutic Lovenox until we rule out VTE -Start him on ceftriaxone and Zithromax for pneumonia -CT chest without contrast if he can tolerate. -Lower extremity venous Doppler to rule out DVT -Continue to monitor  Acute on chronic systolic congestive heart failure (HCC) Patient was initially admitted with concern of acute on chronic HFrEF, prior echocardiogram done in 2020 with EF of 40 to 45%. BNP normal and patient has chronic lower extremity edema with signs of chronic venous congestion and stasis.  Do not weigh himself regularly.  Chest x-ray without any pulmonary congestion.  Unable to check JVD due to body habitus. Received IV fluid with increase in creatinine. -Will resume home torsemide from tomorrow -Cardiology consult at his request  Leukocytosis Worsening leukocytosis.  Procalcitonin at 0.36. Chest x-ray with questionable infiltrate/atelectasis. -Started him on ceftriaxone and Zithromax for CAP coverage. -Monitor CBC  Chest pain Patient has atypical bandlike chest spasms, increased with any body movement and deep breathing.  Mildly positive troponin likely secondary to demand. -Echocardiogram ordered-pending  Essential hypertension Blood pressure within goal. -Continue home carvedilol, Cardura, Imdur.  Paroxysmal atrial fibrillation (HCC) Not on any anticoagulation due to history of GI bleed. -Continue amiodarone and aspirin  Chronic kidney disease, stage 4 (  severe) (HCC) Likely AKI with history of CKD stage IV.  Creatinine increased to 3.21 today, patient did received IV Lasix.   Baseline appears to be around 2.5. -Holding IV Lasix today -Monitor renal function -Avoid nephrotoxins  Chronic obstructive pulmonary disease (HCC) No wheezing.  Slightly increased cough from his baseline. -Continue home bronchodilators  Type 2 diabetes mellitus with chronic kidney disease, with long-term current use of insulin (HCC) Uncontrolled with hyperglycemia and A1c of 11.3 CBG significantly elevated.  Patient is on significant amount of insulin at home. -Increasing Semglee to 35 units twice daily instead of 60 daily. -Continue 20 units with meals -Add resistant SSI -Continue home Jardiance  Obesity, Class III, BMI 40-49.9 (morbid obesity) (HCC) Estimated body mass index is 48.66 kg/m as calculated from the following:   Height as of this encounter: 5\' 8"  (1.727 m).   Weight as of this encounter: 145.2 kg.   This will complicate overall prognosis Encouraged weight loss  Obstructive sleep apnea of adult -CPAP at night, patient is mostly noncompliant at home   Subjective: Patient continued to say that I cannot breathe.  Appears anxious.  Also complaining of back pain.  Unable to lay down.  Physical Exam: Vitals:   04/13/23 0700 04/13/23 0709 04/13/23 0800 04/13/23 1300  BP: (!) 149/81  134/84 (!) 154/91  Pulse: 83  87 93  Resp: (!) 23  (!) 24 (!) 37  Temp:      TempSrc:      SpO2: 92%  94% 96%  Weight:      Height:  5\' 8"  (1.727 m)     General.  Morbidly obese gentleman, in no acute distress. Pulmonary.  Lungs clear bilaterally, normal respiratory effort. CV.  Regular rate and rhythm, no JVD, rub or murmur. Abdomen.  Soft, nontender, nondistended, BS positive. CNS.  Alert and oriented .  No focal neurologic deficit. Extremities.  1+ LE edema with signs of chronic venous congestion, increased erythema on right than left. Psychiatry.  Judgment and insight appears normal.   Data Reviewed: Prior data reviewed  Family Communication: Discussed with girlfriend at  bedside  Disposition: Status is: Inpatient Remains inpatient appropriate because: Severity of illness  Planned Discharge Destination: Home  DVT prophylaxis.  Lovenox Time spent: 50 minutes  This record has been created using Conservation officer, historic buildings. Errors have been sought and corrected,but may not always be located. Such creation errors do not reflect on the standard of care.   Author: Arnetha Courser, MD 04/13/2023 3:01 PM  For on call review [email or web address].

## 2023-04-13 NOTE — Assessment & Plan Note (Signed)
Blood pressure within goal. -Continue home carvedilol, Cardura, Imdur.

## 2023-04-13 NOTE — ED Notes (Signed)
Pt with continued chest and back pain. Floor coverage MD paged, awaiting call back.

## 2023-04-13 NOTE — Consult Note (Addendum)
[facility] CLINIC CARDIOLOGY CONSULT NOTE       Patient ID: Jason Hudson MRN: [id number] DOB/AGE: 05-05-1951 72 y.o.  Admit date: 04/12/2023 Referring Physician Dr. Arnetha Courser  Primary Physician Dr. Nemiah Commander Primary Cardiologist Dr. Juliann Pares Reason for Consultation ?heart failure exacerbation   HPI: Jason Hudson is a 72 y.o. male  with a past medical history of chronic HFrEF with recovered EF on recent cardiac MRI, paroxysmal atrial fibrillation not on anticoagulation, coronary artery disease LHC 2019 with CTO of mid RCA, hypertension, hyperlipidemia chronic kidney disease stage IV, COPD, type 2 diabetes, hypothyroidism, morbid obesity who presented to the ED on 04/12/2023 for crampy chest pain, shortness of breath.  Cardiology was consulted for further evaluation of possible heart failure exacerbation.  Patient reports on Tuesday he began noticing some "chest cramps".  This sensation has been intermittent since Tuesday and he states that the discomfort is worse with laying flat and deep breathing.  Also reports associated shortness of breath and worsening lower extremity edema since Tuesday.  He takes torsemide 80 mg daily at home and metolazone as needed, neither of these improved his leg swelling.  Workup in the ED notable for creatinine 3.13 (baseline 2.5), BUN 44, GFR 21, calcium 11.3, BNP 89, troponin trend 20 > 36 > 43. EKG non-ischemic. CXR with evidence ration of right hemidiaphragm and adjacent atelectasis.  D-dimer noted to be elevated, patient unable to tolerate VQ scan.  Lower extremity venous Doppler negative for DVT.   At the time of my evaluation he is sitting upright in his hospital bed, complaining of back pain and shortness of breath.  He is very anxious about his pain and when he gets worked up then he feels short of breath.  He remains on 3 L nasal cannula, states he does not usually wear oxygen at home but he does have some to use if he needs it.  He states  that he thinks his leg swelling is improved since yesterday.  Is unable to recall his urine output with diuresis.  Continues to experience intermittent episodes of chest cramping, states that he feels better when he is sitting upright.   Review of systems complete and found to be negative unless listed above   Past Medical History:  Diagnosis Date   CHF (congestive heart failure) (HCC)    COPD (chronic obstructive pulmonary disease) (HCC)    Diabetes mellitus without complication (HCC)    Edema    leg swelling   Gastroesophageal reflux disease 12/15/2017   Gout    Hypertension    Melena 11/05/2017   Pneumonia 07/27/2016    Past Surgical History:  Procedure Laterality Date   COLONOSCOPY WITH PROPOFOL N/A 11/07/2017   Procedure: COLONOSCOPY WITH PROPOFOL;  Surgeon: Wyline Mood, MD;  Location: [facility] ENDOSCOPY;  Service: Gastroenterology;  Laterality: N/A;   ESOPHAGOGASTRODUODENOSCOPY (EGD) WITH PROPOFOL N/A 11/07/2017   Procedure: ESOPHAGOGASTRODUODENOSCOPY (EGD) WITH PROPOFOL;  Surgeon: Wyline Mood, MD;  Location: [facility] ENDOSCOPY;  Service: Gastroenterology;  Laterality: N/A;   LEFT HEART CATH AND CORONARY ANGIOGRAPHY N/A 10/23/2017   Procedure: LEFT HEART CATH AND CORONARY ANGIOGRAPHY;  Surgeon: Marcina Millard, MD;  Location: [facility] INVASIVE CV LAB;  Service: Cardiovascular;  Laterality: N/A;    (Not in a hospital admission)  Social History   Socioeconomic History   Marital status: Single    Spouse name: Not on file   Number of children: Not on file   Years of education: Not on file   Highest education level: Not  on file  Occupational History   Not on file  Tobacco Use   Smoking status: Never   Smokeless tobacco: Never  Substance and Sexual Activity   Alcohol use: Not Currently    Comment: ocassionally   Drug use: Never   Sexual activity: Not on file  Other Topics Concern   Not on file  Social History Narrative   Not on file   Social Determinants of Health   Financial  Resource Strain: Not on file  Food Insecurity: No Food Insecurity (04/13/2023)   Hunger Vital Sign    Worried About Running Out of Food in the Last Year: Never true    Ran Out of Food in the Last Year: Never true  Transportation Needs: No Transportation Needs (04/13/2023)   PRAPARE - Administrator, Civil Service (Medical): No    Lack of Transportation (Non-Medical): No  Physical Activity: Not on file  Stress: Not on file  Social Connections: Not on file  Intimate Partner Violence: Not At Risk (04/13/2023)   Humiliation, Afraid, Rape, and Kick questionnaire    Fear of Current or Ex-Partner: No    Emotionally Abused: No    Physically Abused: No    Sexually Abused: No    Family History  Problem Relation Age of Onset   Cancer Mother    CVA Father    Heart disease Father      Vitals:   04/13/23 0700 04/13/23 0709 04/13/23 0800 04/13/23 1300  BP: (!) 149/81  134/84 (!) 154/91  Pulse: 83  87 93  Resp: (!) 23  (!) 24 (!) 37  Temp:      TempSrc:      SpO2: 92%  94% 96%  Weight:      Height:  5\' 8"  (1.727 m)      PHYSICAL EXAM General: Chronically ill-appearing elderly male, well nourished, in mild distress. Sitting upright in hospital bed.  HEENT: Normocephalic and atraumatic. Neck: No JVD.  Lungs: Normal respiratory effort on 3L. Clear bilaterally to auscultation. No wheezes, crackles, rhonchi.  Heart: HRRR. Normal S1 and S2 without gallops or murmurs.  Abdomen: Non-distended appearing.  Msk: Normal strength and tone for age. Extremities: Warm and well perfused. No clubbing, cyanosis. Chronic, tense edema to bilateral LE with associated erythema.  Neuro: Alert and oriented X 3. Psych: Answers questions appropriately.   Labs: Basic Metabolic Panel: Recent Labs    04/12/23 1305 04/13/23 0148  NA 130* 133*  K 4.7 4.3  CL 89* 89*  CO2 26 30  GLUCOSE 459* 284*  BUN 44* 48*  CREATININE 3.13* 3.21*  CALCIUM 11.3* 12.0*   Liver Function Tests: Recent Labs     04/13/23 0148  AST 31  ALT 25  ALKPHOS 81  BILITOT 0.9  PROT 8.3*  ALBUMIN 3.9   Recent Labs    04/13/23 0148  LIPASE 28   CBC: Recent Labs    04/12/23 1305 04/13/23 0148  WBC 16.3* 20.6*  HGB 12.5* 13.6  HCT 38.0* 40.8  MCV 93.4 95.1  PLT 196 226   Cardiac Enzymes: Recent Labs    04/12/23 1305 04/12/23 1535 04/13/23 0148  CKTOTAL  --  82  --   TROPONINIHS 20* 36* 43*   BNP: Recent Labs    04/12/23 1305  BNP 89.7   D-Dimer: Recent Labs    04/13/23 0148  DDIMER 0.86*   Hemoglobin A1C: Recent Labs    04/13/23 0148  HGBA1C 11.3*   Fasting Lipid Panel:  No results for input(s): "CHOL", "HDL", "LDLCALC", "TRIG", "CHOLHDL", "LDLDIRECT" in the last 72 hours. Thyroid Function Tests: No results for input(s): "TSH", "T4TOTAL", "T3FREE", "THYROIDAB" in the last 72 hours.  Invalid input(s): "FREET3" Anemia Panel: No results for input(s): "VITAMINB12", "FOLATE", "FERRITIN", "TIBC", "IRON", "RETICCTPCT" in the last 72 hours.   Radiology: US Venous Img Lower Bilateral (DVT)  Result Date: 04/13/2023 CLINICAL DATA:  Bilateral lower extremity pain and swelling. 3-4 days EXAM: Bilateral LOWER EXTREMITY VENOUS DOPPLER ULTRASOUND TECHNIQUE: Gray-scale sonography with compression, as well as color and duplex ultrasound, were performed to evaluate the deep venous system(s) from the level of the common femoral vein through the popliteal and proximal calf veins. COMPARISON:  None Available. FINDINGS: VENOUS Normal compressibility of the common femoral, superficial femoral, greater saphenous vein and popliteal veins, as well as the visualized calf veins. Patient had difficulty tolerating the procedure as per the sonographer. There is poor visualization of the profunda femoral veins bilaterally and some of the calf veins. No filling defects to suggest DVT on grayscale or color Doppler imaging. Doppler waveforms show normal direction of venous flow, normal respiratory plasticity  and response to augmentation. Limited views of the contralateral common femoral vein are unremarkable. OTHER None. Limitations: Difficulty tolerating the procedure as per the sonographer. Limited visualization as well with overlapping soft tissue IMPRESSION: Limited examination as above. No evidence of DVT bilaterally of the lower extremities above the knee of the visualized vessels. Electronically Signed   By: Karen Kays M.D.   On: 04/13/2023 15:18   DG Chest 2 View  Result Date: 04/12/2023 CLINICAL DATA:  Chest pain EXAM: CHEST - 2 VIEW COMPARISON:  X-ray 01/03/2023 FINDINGS: Under penetrated radiographs enlarged cardiopericardial silhouette. No pneumothorax or edema. No definite pleural effusion. Eventration of the right hemidiaphragm with some adjacent atelectasis or scar. IMPRESSION: Eventration of the right hemidiaphragm with some adjacent atelectasis. Enlarged cardiopericardial silhouette. Electronically Signed   By: Karen Kays M.D.   On: 04/12/2023 14:15   MR CARDIAC MORPHOLOGY W WO CONTRAST  Result Date: 03/15/2023 CLINICAL DATA:  Evaluate for cardiac amyloidosis EXAM: CARDIAC MRI TECHNIQUE: The patient was scanned on a 1.5 Tesla Siemens magnet. A dedicated cardiac coil was used. Functional imaging was done using Fiesta sequences. 2,3, and 4 chamber views were done to assess for RWMA's. Modified Simpson's rule using a short axis stack was used to calculate an ejection fraction on a dedicated work [employer]. The patient received 18 ml of Gadavist. After 10 minutes inversion recovery sequences were used to assess for infiltration and scar tissue. Velocity flow mapping performed in the ascending aorta and main pulmonary artery. CONTRAST:  18 cc  of Gadavist FINDINGS: 1. Normal left ventricular size, thickness and systolic function (LVEF = 66%). There are no regional wall motion abnormalities. There is no late gadolinium enhancement in the left ventricular myocardium. LV native  T1 value 996 ms (normal <1000 ms) LVEDV: 134 ml LVESV: 45 ml SV: 89 ml CO: 7L/min Myocardial mass: 155 g 2. Normal right ventricular size, thickness and systolic function (RVEF = 58%). There are no regional wall motion abnormalities. 3.  Normal left and right atrial size. 4. Normal size of the aortic root, ascending aorta and pulmonary artery. 5.  No significant valvular abnormalities. 6.  Normal pericardium.  No pericardial effusion. IMPRESSION: 1.  Normal LV size and systolic function.  LVEF 66%. 2.  There is no LGE or scar. 3. Normal LV native T1 value, no evidence for myocardial  infiltration. 4.  Normal RV size and function. 5.  No evidence for cardiac amyloidosis. Electronically Signed   By: Debbe Odea M.D.   On: 03/15/2023 13:42   MR CARDIAC VELOCITY FLOW MAP  Result Date: 03/15/2023 CLINICAL DATA:  Evaluate for cardiac amyloidosis EXAM: CARDIAC MRI TECHNIQUE: The patient was scanned on a 1.5 Tesla Siemens magnet. A dedicated cardiac coil was used. Functional imaging was done using Fiesta sequences. 2,3, and 4 chamber views were done to assess for RWMA's. Modified Simpson's rule using a short axis stack was used to calculate an ejection fraction on a dedicated work [employer] officer, [employer]. The patient received 18 ml of Gadavist. After 10 minutes inversion recovery sequences were used to assess for infiltration and scar tissue. Velocity flow mapping performed in the ascending aorta and main pulmonary artery. CONTRAST:  18 cc  of Gadavist FINDINGS: 1. Normal left ventricular size, thickness and systolic function (LVEF = 66%). There are no regional wall motion abnormalities. There is no late gadolinium enhancement in the left ventricular myocardium. LV native T1 value 996 ms (normal <1000 ms) LVEDV: 134 ml LVESV: 45 ml SV: 89 ml CO: 7L/min Myocardial mass: 155 g 2. Normal right ventricular size, thickness and systolic function (RVEF = 58%). There are no regional wall motion abnormalities. 3.   Normal left and right atrial size. 4. Normal size of the aortic root, ascending aorta and pulmonary artery. 5.  No significant valvular abnormalities. 6.  Normal pericardium.  No pericardial effusion. IMPRESSION: 1.  Normal LV size and systolic function.  LVEF 66%. 2.  There is no LGE or scar. 3. Normal LV native T1 value, no evidence for myocardial infiltration. 4.  Normal RV size and function. 5.  No evidence for cardiac amyloidosis. Electronically Signed   By: Debbe Odea M.D.   On: 03/15/2023 13:42    ECHO pending  TELEMETRY reviewed by me [facility]) 04/13/2023 : patient not currently on telemetry  EKG reviewed by me: NSR 1st degree AVB, non-ischemic  Data reviewed by me [facility]) 04/13/2023: last 24h vitals tele labs imaging I/O ED provider note, admission H&P  Principal Problem:   Shortness of breath Active Problems:   Essential hypertension   Obstructive sleep apnea of adult   Acute on chronic systolic congestive heart failure (HCC)   Type 2 diabetes mellitus with chronic kidney disease, with long-term current use of insulin (HCC)   Chronic kidney disease, stage 4 (severe) (HCC)   Chronic obstructive pulmonary disease (HCC)   Leukocytosis   Chest pain   Paroxysmal atrial fibrillation (HCC)   Obesity, Class III, BMI 40-49.9 (morbid obesity) (HCC)    ASSESSMENT AND PLAN:  Jason Hudson is a 72 y.o. male  with a past medical history of chronic HFrEF with recovered EF on recent cardiac MRI, paroxysmal atrial fibrillation not on anticoagulation, coronary artery disease, hypertension, hyperlipidemia chronic kidney disease stage IV, COPD, type 2 diabetes, hypothyroidism, morbid obesity who presented to the ED on 04/12/2023 for so crampy chest pain, shortness of breath.  Cardiology was consulted for further evaluation of possible heart failure exacerbation.  # ?Acute on chronic heart failure # Shortness of breath # Hypertension Patient with 2 days of worsening shortness of breath  and lower extremity edema.  No improvement with home diuretics.  BNP on admission 89, chest x-ray without overt evidence of volume overload.  Has received 2 doses of IV Lasix 80 mg with improvement in lower extremity edema, shortness of breath remains and he  is currently on 3 L nasal cannula.  He has a history of systolic heart failure with EF 40-45% in 2020 now recovered.  Cardiac MRI done 02/2023 with EF 66%. -Echo pending -Will give another dose of IV Lasix 80 mg this evening and reassess in the morning. -Start jardiance 25 mg daily. -Continue home carvedilol 6.25 mg twice daily. Recently started on telmisartan by nephrology but this is currently being held.  # Atypical chest pain # Coronary artery disease Patient with 2 days of "cramping" in his chest.  Symptoms worse with laying flat, deep breathing. History of CAD with LHC in 2019 demonstrating CTO of mid RCA with collaterals. -Suspect not cardiac in nature. Monitor for changes in symptoms.  -No plan for cardiac diagnostics at this time. -Continue home imdur 30 mg daily.  # COPD # Leukocytosis -Management per primary  # Paroxysmal atrial fibrillation Hx PAF in NSR on EKG in the ED. Currently on telemetry. Patient without palpitations.  -Continue home amiodarone 200 mg daily.  -Continue aspirin 81 mg daily. Patient not on anticoagulation due to history of GI bleeds.   # Acute kidney injury on CKD stage IV Labs on presentation creatinine 3.13, GFR 21, BUN 44.  Today creatinine is 3.21, GFR 20, BUN 48 -Nephrology consulted -Monitor renal function closely -Monitor and replenish electrolytes for a goal K >4, Mag >2     This patient's plan of care was discussed and created with Dr. Melton Alar and she is in agreement.  Signed: Cheryl Flash, PA-C 04/13/2023, 4:43 PM [facility] Cardiology

## 2023-04-13 NOTE — Assessment & Plan Note (Signed)
Patient was initially admitted with concern of acute on chronic HFrEF, prior echocardiogram done in 2020 with EF of 40 to 45%. BNP normal and patient has chronic lower extremity edema with signs of chronic venous congestion and stasis.  Do not weigh himself regularly.  Chest x-ray without any pulmonary congestion.  Unable to check JVD due to body habitus. Received IV fluid with increase in creatinine. -Will resume home torsemide from tomorrow -Cardiology consult at his request

## 2023-04-13 NOTE — Assessment & Plan Note (Signed)
Estimated body mass index is 48.66 kg/m as calculated from the following:   Height as of this encounter: 5\' 8"  (1.727 m).   Weight as of this encounter: 145.2 kg.   This will complicate overall prognosis Encouraged weight loss

## 2023-04-13 NOTE — Progress Notes (Signed)
CROSS COVER NOTE  NAME: Jason Hudson MRN: [id number] DOB : 10-21-1950    Concern as stated by nurse / staff   he's had lido patch, flexeril, and oxy without relief. requesting to see you at bedside      Pertinent findings on chart review: Patient admitted on 8/21.  H&P reviewed.  Admitted with CHF exacerbation and having chest pain described as follows: "Chest pain -Appears to be musculoskeletal, noncardiac -Trial of lidocaine patch and Flexeril"    04/13/2023   12:45 AM 04/13/2023   12:35 AM 04/13/2023   12:00 AM  Vitals with BMI  Systolic  152 128  Diastolic  82 74  Pulse 74 77 76  Cardiac Panel (last 3 results) Recent Labs    04/12/23 1305 04/12/23 1535  CKTOTAL  --  82  TROPONINIHS 20* 36*   CK normal at 82 Chest x-ray with no acute cardiopulmonary disease .  Patient assessment Patient seen sitting upright in recliner at bedside.  He is complaining of a bandlike cramping pain going across the upper abdomen from right to left.  While describing the pain he stands up and burps and says that that is the only thing that relieves the pain  Review of Systems  Respiratory:  Positive for shortness of breath. Negative for cough.   Cardiovascular:  Positive for orthopnea and leg swelling.  Gastrointestinal:  Positive for abdominal pain and heartburn. Negative for diarrhea, nausea and vomiting.  Genitourinary:  Negative for flank pain.  Physical Exam Vitals and nursing note reviewed.  Constitutional:      General: He is not in acute distress.    Appearance: He is morbidly obese.  HENT:     Head: Normocephalic and atraumatic.  Cardiovascular:     Rate and Rhythm: Normal rate and regular rhythm.     Heart sounds: Normal heart sounds.  Pulmonary:     Effort: Pulmonary effort is normal.     Breath sounds: Normal breath sounds.  Abdominal:     Palpations: Abdomen is soft.     Tenderness: There is no abdominal tenderness.  Musculoskeletal:     Right lower  leg: Edema present.     Left lower leg: Edema present.  Neurological:     Mental Status: Mental status is at baseline.       Assessment and  Interventions   Assessment:  Unimproving chest pain, uncertain etiology.  (Differential includes GI, musculoskeletal, cardiac, PE) History of peptic ulcer disease  Plan: Creatinine precludes CTA chest Will get D-dimer though likely to be elevated with renal function, and will repeat troponin Will give empiric therapeutic dose of Lovenox--> patient refuses due to history of GI bleed Trial of GI cocktail given history of PUD V/Q in the a.m. Lower extremity Doppler ordered earlier, currently pending  Cardiac Panel (last 3 results) Recent Labs    04/12/23 1305 04/12/23 1535 04/13/23 0148  CKTOTAL  --  82  --   TROPONINIHS 20* 36* 43*   D-dimer 0.86  -Cardiac enzymes not showing ACS trend - Follow lower extremity Doppler and V/Q -Continue current management for now    CRITICAL CARE Performed by: Andris Baumann   Total critical care time: 60 minutes  Critical care time was exclusive of separately billable procedures and treating other patients.  Critical care was necessary to treat or prevent imminent or life-threatening deterioration.  Critical care was time spent personally by me on the following activities: development of treatment plan  with patient and/or surrogate as well as nursing, discussions with consultants, evaluation of patient's response to treatment, examination of patient, obtaining history from patient or surrogate, ordering and performing treatments and interventions, ordering and review of laboratory studies, ordering and review of radiographic studies, pulse oximetry and re-evaluation of patient's condition.

## 2023-04-13 NOTE — Inpatient Diabetes Management (Signed)
Inpatient Diabetes Program Recommendations  AACE/ADA: New Consensus Statement on Inpatient Glycemic Control (2015)  Target Ranges:  Prepandial:   less than 140 mg/dL      Peak postprandial:   less than 180 mg/dL (1-2 hours)      Critically ill patients:  140 - 180 mg/dL   Lab Results  Component Value Date   GLUCAP 289 (H) 04/13/2023   HGBA1C 11.3 (H) 04/13/2023    Latest Reference Range & Units 04/12/23 13:07 04/12/23 16:58 04/12/23 22:11 04/13/23 07:26  Glucose-Capillary 70 - 99 mg/dL 213 (H) 086 (H) 578 (H) 289 (H)  (H): Data is abnormally high  Review of Glycemic Control  Diabetes history: DM2 Outpatient Diabetes medications: Tresiba 120 units ac breakfast am, Glucotrol 5 mg qam Current orders for Inpatient glycemic control: Semglee 60 q hs, Novolog 20 units tid meal coverage, Novolog 0-5 units hs,  Tradjenta 5 mg qam  Inpatient Diabetes Program Recommendations:   Please consider: -Move timing of Semglee to this am and every day instead of waiting until hs  Spoke with pt and significant other about A1C results 11.3 (average blood glucose 277 over the past 2-3 months) and explained what an A1C is, basic pathophysiology of DM Type 2, basic home care, basic diabetes diet nutrition principles, importance of checking CBGs and maintaining good CBG control to prevent long-term and short-term complications. Reviewed signs and symptoms of hyperglycemia and hypoglycemia and how to treat hypoglycemia at home. Also reviewed blood sugar goals at home.  RNs to provide ongoing basic DM education at bedside with this patient. Have ordered educational booklet Living Well With Diabetes. Discussed basic nutrition. Patient drinks only water but does not limit carbohydrates in his meals. Patient does not want to attend outpatient diabetes classes @ this time. Reviewed how insulin needs increase with increase in amount of carbohydrates and sugar.  Thank you, Jason Hudson. Geneva Barrero, RN, MSN, CDE  Diabetes  Coordinator Inpatient Glycemic Control Team Team Pager [phone] (8am-5pm) 04/13/2023 9:25 AM

## 2023-04-13 NOTE — Progress Notes (Signed)
ANTICOAGULATION CONSULT NOTE  Pharmacy Consult for enoxaparin Indication: DVT  Allergies  Allergen Reactions   Red Dye #40 (Allura Red) Anaphylaxis and Other (See Comments)    "not as allergic to it as I used to be. I can take a little of it" "not as allergic to it as I used to be. I can take a little of it" "not as allergic to it as I used to be. I can take a little of it" Other reaction(s): Not available "not as allergic to it as I used to be. I can take a little of it" "not as allergic to it as I used to be. I can take a little of it" "not as allergic to it as I used to be. I can take a little of it" "not as allergic to it as I used to be. I can take a little of it" "not as allergic to it as I used to be. I can take a little of it" "not as allergic to it as I used to be. I can take a little of it" "not as allergic to it as I used to be. I can take a little of it"    Atorvastatin Other (See Comments)    Other reaction(s): Not available Other reaction(s): Other (See Comments)    Atorvastatin Calcium     Patient Measurements: Height: 5\' 8"  (172.7 cm) Weight: (!) 145.2 kg (320 lb) IBW/kg (Calculated) : 68.4  Vital Signs: Temp: 98.5 F (36.9 C) (08/22 0600) BP: 134/84 (08/22 0800) Pulse Rate: 87 (08/22 0800)  Labs: Recent Labs    04/12/23 1305 04/12/23 1535 04/13/23 0148  HGB 12.5*  --  13.6  HCT 38.0*  --  40.8  PLT 196  --  226  CREATININE 3.13*  --  3.21*  CKTOTAL  --  82  --   TROPONINIHS 20* 36* 43*    Estimated Creatinine Clearance: 30 mL/min (A) (by C-G formula based on SCr of 3.21 mg/dL (H)).   Medical History: Past Medical History:  Diagnosis Date   CHF (congestive heart failure) (HCC)    COPD (chronic obstructive pulmonary disease) (HCC)    Diabetes mellitus without complication (HCC)    Edema    leg swelling   Gastroesophageal reflux disease 12/15/2017   Gout    Hypertension    Melena 11/05/2017   Pneumonia 07/27/2016    Medications:   Scheduled:   allopurinol  100 mg Oral Daily   amiodarone  200 mg Oral Daily   aspirin EC  81 mg Oral Daily   calcitRIOL  0.25 mcg Oral Daily   carvedilol  6.25 mg Oral BID WC   doxazosin  4 mg Oral QHS   empagliflozin  25 mg Oral Daily   enoxaparin (LOVENOX) injection  150 mg Subcutaneous Once   fluticasone  2 spray Each Nare BID   heparin  5,000 Units Subcutaneous Q12H   insulin aspart  0-5 Units Subcutaneous QHS   insulin aspart  20 Units Subcutaneous TID WC   insulin glargine-yfgn  60 Units Subcutaneous Daily   isosorbide mononitrate  30 mg Oral Daily   [START ON 04/14/2023] levothyroxine  112 mcg Oral Q0600   lidocaine  1 patch Transdermal Q24H   linagliptin  5 mg Oral Daily   living well with diabetes book   Does not apply Once   loratadine  10 mg Oral Daily   sodium chloride flush  3 mL Intravenous Q12H    Assessment: 72 y.o.  male with chronic HFrEF with LVEF 40-45%, PAF not on anticoagulation due to history of severe GI bleed, CKD stage IV, COPD, T2DM with insulin resistance, gout, hypothyroidism, BPH, morbid obesity  who presented with shortness of breath, leg swelling and chest pain.   Goal of Therapy:  Monitor platelets by anticoagulation protocol: Yes   Plan:  ---start enoxaparin 1 mg/kg subcutaneously twice daily ---follow renal function for needed dose adjustments ---CBC at least every 72 hours per protocol  Lowella Bandy 04/13/2023,2:49 PM

## 2023-04-13 NOTE — Hospital Course (Addendum)
Taken from H&P.  Jason Hudson is a 72 y.o. male with medical history significant of chronic HFrEF with LVEF 40-45%, PAF not on anticoagulation due to history of severe GI bleed, CKD stage IV, COPD, IDDM with insulin resistance, gout, hypothyroidism, BPH, morbid obesity, presented with increasing shortness of breath and leg swelling and chest pain.   On presentation patient was hemodynamically stable.  Chest x-ray with eventration of right hemidiaphragm with some adjacent atelectasis.  No pulmonary edema.  Labs pertinent for AKI with creatinine at 3.1, baseline seems to be around 2.1-2.4, leukocytosis at 16.3.  Troponin 20 EKG sinus rhythm, chronic ST changes on multiple leads.  Patient was given 80 mg of IV Lasix in ED and started on IV diuresis. Chest pain appears to be musculoskeletal so he was given a trial of lidocaine patch and Flexeril.  8/22: Overnight persistent bandlike chest pain, which increased with body movements and deep breathing, D-dimer was checked and found to be elevated, patient also has CKD stage IV, VQ scan was ordered by night on-call. Repeat troponin 43, A1c 11.3, slight worsening of renal function with BUN 48 and creatinine 3.21, worsening leukocytosis at 20.6.  BNP normal. Holding IV Lasix today, procalcitonin 0.36, COVID PCR negative.  Significant orthopnea and unable to lay down for any scan.  [facility] cardiology was consulted.  Starting him on ceftriaxone and Zithromax for CAP coverage.  8/23: Patient overnight became more dyspneic and febrile at 103.  ABG with mild hypercapnia and hypoxia, due to increased work of breathing he was placed on BiPAP. Respiratory viral panel and MRSA PCR negative.  Worsening leukocytosis and procalcitonin today.  Urine has not been sent to lab despite multiple reminders. Ordered blood and sputum culture and switching ceftriaxone with cefepime.  UOP of 2450 recorded, creatinine worsening at 3.27.  Cardiology gave another dose of IV  Lasix 80 mg yesterday. Patient now meeting sepsis criteria likely secondary to pneumonia, still unable to obtain further imaging.  Ordered some IV fluid, holding Lasix.  8/24: Vital stable, VQ scan negative for PE, renal ultrasound without any acute abnormality.  Chest CT with cardiomegaly, bibasilar atelectasis, diffuse thyroid enlargement with recommendation of nonemergent thyroid ultrasound which can be done by his PCP.  Labs with improving leukocytosis, sodium 133, small improvement to creatinine at 3.12 today.  Remained on 8 L of oxygen.  Home dose of torsemide was restarted today.  8/25: Renal functions and leukocytosis improving, on 5 L of oxygen, UOP of 2100.  PT and OT recommending SNF.  TOC to find placement.  8/26: Hemodynamically stable, will complete a 5-day course of antibiotic today.  Continue to have some intermittent crampy lower chest pain, PE and ACS has been ruled out, likely musculoskeletal.  Patient can try muscle relaxant and supportive care. [facility].  8/27: 1/4 prior blood culture bottles started turning positive for staph lugdunensis, likely contaminant but patient did had leukocytosis and was febrile, was treated did for concern of pneumonia although there was no infiltrate on chest x-ray but he does has upper respiratory symptoms.  Discussed with ID and they recommend repeating blood culture and starting on cefazolin.  No hardware.  Repeating blood cultures.  Did obtain insurance authorization, ID was also consulted.  8/28: Because of his persistent back pain MRI thoracic spine was obtained and it came back positive for T5-T4 discitis, osteomyelitis, some associated epidural and paraspinal phlegmon but no drainable fluid collection.  Case was also discussed with  IR and they do not think that there is anything to drain at this time.  Patient will need at least 6 weeks of IV antibiotics-ID to decide.  If repeat blood cultures remain  negative by tomorrow we will order PICC line and patient should be able to go to his rehab before Friday.  8/29: Repeat blood cultures remain negative.  PICC line was ordered but nephrology would like to get him a tunnel catheter with vascular surgery-they will place the catheter tomorrow morning.  Patient will be going to facility on daptomycin until 05/26/2023 per ID.  Susceptibility with oxacillin resistance.

## 2023-04-13 NOTE — Progress Notes (Signed)
Attempted to communicate with patient concerning CPAP order, family at bedside unable to answer questions. RN approached and asked me not to disturb patient at this time. Unable to get information concerning CPAP settings and/or usage at home, will attempt again at later time.

## 2023-04-13 NOTE — Assessment & Plan Note (Signed)
Likely AKI with history of CKD stage IV.  Creatinine increased to 3.21 today, patient did received IV Lasix.  Baseline appears to be around 2.5. -Holding IV Lasix today -Monitor renal function -Avoid nephrotoxins

## 2023-04-13 NOTE — Assessment & Plan Note (Signed)
-  CPAP at night, patient is mostly noncompliant at home

## 2023-04-13 NOTE — Assessment & Plan Note (Addendum)
Patient with persistent shortness of breath although saturating well on 2 L of oxygen, uses intermittent oxygen at home.  Keeps saying that I cannot breathe.  No wheezing, chest x-ray with no pulmonary edema. BNP normal so less likely a CHF exacerbation. Significant leukocytosis and procalcitonin 0.36.  Pleuritic chest pain. Early pneumonia versus PE is on differential.  D-dimer elevated but patient also has CKD stage IV so that was inconclusive. Unable to obtain VQ scan-as patient cannot lay down. Initially refused therapeutic Lovenox due to concern of GI bleed and later agrees. -Obtain ABG to rule out any occult hypoxia -Therapeutic Lovenox until we rule out VTE -Start him on ceftriaxone and Zithromax for pneumonia -CT chest without contrast if he can tolerate. -Lower extremity venous Doppler to rule out DVT -Continue to monitor

## 2023-04-13 NOTE — TOC Benefit Eligibility Note (Signed)
Patient Product/process development scientist completed.    The patient is insured through [employer]/ [employer]. Patient has Medicare and is not eligible for a copay card, but may be able to apply for patient assistance, if available.    Ran test claim for Entresto 24-26 mg and the current 30 day co-pay is $164.98 due to being in Coverage Gap (donut hole).  Ran test claim for Farxiga 10 mg and the current 30 day co-pay is $139.63 due to being in Coverage Gap (donut hole).  Ran test claim for dapaglifloxin Marcelline Deist) 10 mg and Not on Formulary   This test claim was processed through [facility]- copay amounts may vary at other pharmacies due to [employer], or as the patient moves through the different stages of their insurance plan.     Roland Earl, CPHT Pharmacy Technician III Certified Patient Advocate [facility] Patient Advocate Team Direct Number: [phone]  Fax: [phone]

## 2023-04-13 NOTE — Assessment & Plan Note (Addendum)
Uncontrolled with hyperglycemia and A1c of 11.3 CBG significantly elevated.  Patient is on significant amount of insulin at home. -Increasing Semglee to 35 units twice daily instead of 60 daily. -Continue 20 units with meals -Add resistant SSI -Continue home Jardiance

## 2023-04-13 NOTE — Progress Notes (Addendum)
[facility] HF Stewardship  PCP: Enid Baas, MD  PCP-Cardiologist: None  HPI: Jason Hudson is a 72 y.o. male with chronic HFrEF with LVEF 40-45%, PAF not on anticoagulation due to history of severe GI bleed, CKD stage IV, COPD, T2DM with insulin resistance, gout, hypothyroidism, BPH, morbid obesity  who presented with shortness of breath, leg swelling and chest pain. Underwent LHC in 2019 showing 70% stenosis of Ost 1st Sept, 50% of Ost 2nd Mrg, and 100% Mid RCA stenosis with collaterals treated with medical management. LVEDP at that time was ~17. TTE in 2019 showed LVEF of 50-55%, which decreased in 2020 to 40-45%. MRI in 02/2023 showed improvement in LVEF to 66%. Echocardiogram performed this morning, but has not been read. Troponin on presentation was minimally elevated. PCT of 0.36 not suggestive of bacterial infection. CT scheduled for today.  Pertinent Lab Values: Creatinine, Ser  Date Value Ref Range Status  04/13/2023 3.21 (H) 0.61 - 1.24 mg/dL Final   BUN  Date Value Ref Range Status  04/13/2023 48 (H) 8 - 23 mg/dL Final  66/44/0347 35 (H) 8 - 27 mg/dL Final   Potassium  Date Value Ref Range Status  04/13/2023 4.3 3.5 - 5.1 mmol/L Final   Sodium  Date Value Ref Range Status  04/13/2023 133 (L) 135 - 145 mmol/L Final  10/21/2020 137 134 - 144 mmol/L Final   B Natriuretic Peptide  Date Value Ref Range Status  04/12/2023 89.7 0.0 - 100.0 pg/mL Final    Comment:    Performed at [facility], [address] [place], [place] [address]   Magnesium  Date Value Ref Range Status  09/15/2018 2.1 1.7 - 2.4 mg/dL Final    Comment:    Performed at [facility], [address] [place], [place] [address]   Hgb A1c MFr Bld  Date Value Ref Range Status  04/13/2023 11.3 (H) 4.8 - 5.6 % Final    Comment:    (NOTE) Pre diabetes:          5.7%-6.4%  Diabetes:              >6.4%  Glycemic control for   <7.0% adults with diabetes    TSH  Date  Value Ref Range Status  10/21/2020 16.300 (H) 0.450 - 4.500 uIU/mL Final    Vital Signs: Temp:  [98 F (36.7 C)-98.5 F (36.9 C)] 98.5 F (36.9 C) (08/22 0600) Pulse Rate:  [69-98] 87 (08/22 0800) Resp:  [15-24] 24 (08/22 0800) BP: (109-159)/(55-95) 134/84 (08/22 0800) SpO2:  [92 %-98 %] 94 % (08/22 0800) Weight:  [145.2 kg (320 lb)] 145.2 kg (320 lb) (08/22 0153)  No intake or output data in the 24 hours ending 04/13/23 1057  Current Inpatient Medications:    Beta Blocker: Carvedilol 6.25 mg BID           Other vasoactive medications include isosorbide mononitrate 30 mg daily  Prior to admission HF Medications:  Carvedilol 6.25 mg BID Torsemide 80 mg daily Metolazone 2.5 mg as needed  Assessment: 1. Acute on chronic systolic heart failure (LVEF 40-45 in 2020%), due to NICM. NYHA class IV symptoms.  -Uncomfortable today with shortness of breath at rest, severe orthopnea, cough, and diffuse pain. Pain is relieved with burping. Further work up in progress. -Appears volume overloaded on exam with some LEE though difficult to assess given body habitus. No I/O or daily weight charted. BNP is normal, suggesting no volume overload. Has been given Lasix 80 mg  IV x1, however this is lower than home dose of torsemide. Would benefit from increased Lasix dose or dual nephron blockade. -BP elevated, would benefit from the addition of Entresto, however renal function precludes addition at this time. Can consider hydralazine for afterload reduction while awaiting improvement in renal function. -Use of SGLT2i with an A1c of 11.3 would increase risk of UTI, can consider when glucose is better controlled and renal function is stable.  Plan: 1) Medication changes recommended at this time: -Consider hydralazine 10 mg TID -Unable to assess volume status given body habitus and  symptoms are discordant to BNP. Of note: torsemide 80 mg = Lasix 80 mg IV. If increase from home dose is required, can  consider doubling Lasix to 160 mg IV or adding metolazone 2.5 mg.   2) Patient assistance: Sherryll Burger copay is [phone] (in Medicare coverage gap) -Farxiga copay $139.63 (in Medicare coverage gap)  3) Education: -To be completed prior to discharge.  Medication Assistance / Insurance Benefits Check:  Does the patient have prescription insurance?    Type of insurance plan:   Does the patient qualify for medication assistance through manufacturers or grants? Pending. May qualify depending on income.  Outpatient Pharmacy:  Prior to admission outpatient pharmacy: Pending     Thank you for involving pharmacy in this patient's care.  Enos Fling, PharmD, BCPS Phone - [phone] Clinical Pharmacist 04/13/2023 10:57 AM

## 2023-04-14 ENCOUNTER — Inpatient Hospital Stay: Payer: PPO

## 2023-04-14 ENCOUNTER — Inpatient Hospital Stay: Admit: 2023-04-14 | Payer: PPO

## 2023-04-14 DIAGNOSIS — I5023 Acute on chronic systolic (congestive) heart failure: Secondary | ICD-10-CM | POA: Diagnosis not present

## 2023-04-14 DIAGNOSIS — I5021 Acute systolic (congestive) heart failure: Secondary | ICD-10-CM

## 2023-04-14 DIAGNOSIS — R079 Chest pain, unspecified: Secondary | ICD-10-CM | POA: Diagnosis not present

## 2023-04-14 DIAGNOSIS — R0602 Shortness of breath: Secondary | ICD-10-CM | POA: Diagnosis not present

## 2023-04-14 DIAGNOSIS — D72829 Elevated white blood cell count, unspecified: Secondary | ICD-10-CM | POA: Diagnosis not present

## 2023-04-14 LAB — RESPIRATORY PANEL BY PCR

## 2023-04-14 LAB — GLUCOSE, CAPILLARY
Glucose-Capillary: 156 mg/dL — ABNORMAL HIGH (ref 70–99)
Glucose-Capillary: 181 mg/dL — ABNORMAL HIGH (ref 70–99)
Glucose-Capillary: 136 mg/dL — ABNORMAL HIGH (ref 70–99)
Glucose-Capillary: 100 mg/dL — ABNORMAL HIGH (ref 70–99)

## 2023-04-14 LAB — BLOOD GAS, VENOUS
Acid-Base Excess: 8 mmol/L — ABNORMAL HIGH (ref 0.0–2.0)
Bicarbonate: 33.9 mmol/L — ABNORMAL HIGH (ref 20.0–28.0)
Delivery systems: POSITIVE
FIO2: 50 %
O2 Saturation: 91 %
Patient temperature: 37
pCO2, Ven: 51 mmHg (ref 44–60)
pH, Ven: 7.43 (ref 7.25–7.43)
pO2, Ven: 60 mmHg — ABNORMAL HIGH (ref 32–45)

## 2023-04-14 LAB — ECHOCARDIOGRAM COMPLETE
Height: 68 in
S' Lateral: 2.4 cm
Weight: 5120 [oz_av]

## 2023-04-14 LAB — CBC
HCT: 36 % — ABNORMAL LOW (ref 39.0–52.0)
Hemoglobin: 11.7 g/dL — ABNORMAL LOW (ref 13.0–17.0)
MCH: 31.1 pg (ref 26.0–34.0)
MCHC: 32.5 g/dL (ref 30.0–36.0)
MCV: 95.7 fL (ref 80.0–100.0)
Platelets: 187 10*3/uL (ref 150–400)
RBC: 3.76 MIL/uL — ABNORMAL LOW (ref 4.22–5.81)
RDW: 14 % (ref 11.5–15.5)
WBC: 31.1 10*3/uL — ABNORMAL HIGH (ref 4.0–10.5)
nRBC: 0 % (ref 0.0–0.2)

## 2023-04-14 LAB — PROCALCITONIN: Procalcitonin: 2.02 ng/mL

## 2023-04-14 LAB — BASIC METABOLIC PANEL WITH GFR
Anion gap: 17 — ABNORMAL HIGH (ref 5–15)
BUN: 57 mg/dL — ABNORMAL HIGH (ref 8–23)
CO2: 30 mmol/L (ref 22–32)
Calcium: 11.3 mg/dL — ABNORMAL HIGH (ref 8.9–10.3)
Chloride: 89 mmol/L — ABNORMAL LOW (ref 98–111)
Creatinine, Ser: 3.27 mg/dL — ABNORMAL HIGH (ref 0.61–1.24)
GFR, Estimated: 20 mL/min — ABNORMAL LOW
Glucose, Bld: 129 mg/dL — ABNORMAL HIGH (ref 70–99)
Potassium: 4.2 mmol/L (ref 3.5–5.1)
Sodium: 136 mmol/L (ref 135–145)

## 2023-04-14 LAB — MAGNESIUM: Magnesium: 1.6 mg/dL — ABNORMAL LOW (ref 1.7–2.4)

## 2023-04-14 LAB — LACTIC ACID, PLASMA
Lactic Acid, Venous: 1.9 mmol/L (ref 0.5–1.9)
Lactic Acid, Venous: 1.4 mmol/L (ref 0.5–1.9)

## 2023-04-14 LAB — MRSA NEXT GEN BY PCR, NASAL: MRSA by PCR Next Gen: NOT DETECTED

## 2023-04-14 MED ORDER — INSULIN ASPART 100 UNIT/ML IJ SOLN
5.0000 [IU] | Freq: Three times a day (TID) | INTRAMUSCULAR | Status: DC
  Administered 2023-04-14 – 2023-04-21 (×16): 5 [IU] via SUBCUTANEOUS
  Filled 2023-04-14 (×16): qty 1

## 2023-04-14 MED ORDER — MAGNESIUM SULFATE 4 GM/100ML IV SOLN
4.0000 g | Freq: Once | INTRAVENOUS | Status: AC
  Administered 2023-04-14: 4 g via INTRAVENOUS
  Filled 2023-04-14: qty 100

## 2023-04-14 MED ORDER — SODIUM CHLORIDE 0.9 % IV SOLN
2.0000 g | INTRAVENOUS | Status: DC
  Administered 2023-04-14: 2 g via INTRAVENOUS
  Filled 2023-04-14 (×2): qty 12.5

## 2023-04-14 MED ORDER — TECHNETIUM TO 99M ALBUMIN AGGREGATED
4.0000 | Freq: Once | INTRAVENOUS | Status: AC | PRN
  Administered 2023-04-14: 4.11 via INTRAVENOUS

## 2023-04-14 MED ORDER — FUROSEMIDE 10 MG/ML IJ SOLN: 80.0000 mg | Freq: Once | INTRAMUSCULAR | Status: DC

## 2023-04-14 MED ORDER — FUROSEMIDE 10 MG/ML IJ SOLN: 80.0000 mg | Freq: Two times a day (BID) | INTRAMUSCULAR | Status: DC

## 2023-04-14 MED ORDER — LACTATED RINGERS IV SOLN: INTRAVENOUS | Status: AC

## 2023-04-14 NOTE — Assessment & Plan Note (Signed)
Patient was initially admitted with concern of acute on chronic HFrEF, prior echocardiogram done in 2020 with EF of 40 to 45%.  Repeat echo with normal EF, indeterminate diastolic function. BNP normal and patient has chronic lower extremity edema with signs of chronic venous congestion and stasis.  Do not weigh himself regularly.  Chest x-ray without any pulmonary congestion.  Unable to check JVD due to body habitus. Received IV Lasix which resulted in worsening of creatinine. -Holding more IV diuresis -Restarting home torsemide as renal function now stable -Cardiology consult at his request

## 2023-04-14 NOTE — Progress Notes (Signed)
[facility] HF Stewardship  PCP: Enid Baas, MD  PCP-Cardiologist: None  HPI: Jason Hudson is a 72 y.o. male with chronic HFrEF with LVEF previously 40-45%, PAF not on anticoagulation due to history of severe GI bleed, CKD stage IV, COPD, T2DM with insulin resistance, gout, hypothyroidism, BPH, morbid obesity  who presented with shortness of breath, leg swelling and chest pain. Underwent LHC in 2019 showing 70% stenosis of Ost 1st Sept, 50% of Ost 2nd Mrg, and 100% Mid RCA stenosis with collaterals treated with medical management. LVEDP at that time was ~17. TTE in 2019 showed LVEF of 50-55%, which decreased in 2020 to 40-45%. MRI in 02/2023 showed improvement in LVEF to 66%. Echocardiogram 04/13/23 showed LVEF of 55-60%.Troponin on presentation was minimally elevated and BNP was WNL. PCT increased from 0.36 on 8/22 to 2.02 on 8/23. Lactic acid 1.9. CT ordered.  Pertinent Lab Values: Creatinine, Ser  Date Value Ref Range Status  04/14/2023 3.27 (H) 0.61 - 1.24 mg/dL Final   BUN  Date Value Ref Range Status  04/14/2023 57 (H) 8 - 23 mg/dL Final  40/98/1191 35 (H) 8 - 27 mg/dL Final   Potassium  Date Value Ref Range Status  04/14/2023 4.2 3.5 - 5.1 mmol/L Final   Sodium  Date Value Ref Range Status  04/14/2023 136 135 - 145 mmol/L Final  10/21/2020 137 134 - 144 mmol/L Final   B Natriuretic Peptide  Date Value Ref Range Status  04/12/2023 89.7 0.0 - 100.0 pg/mL Final    Comment:    Performed at [facility], [address] [place], [place] [address]   Magnesium  Date Value Ref Range Status  04/14/2023 1.6 (L) 1.7 - 2.4 mg/dL Final    Comment:    Performed at [facility], [address] [place], [place] [address]   Hgb A1c MFr Bld  Date Value Ref Range Status  04/13/2023 11.3 (H) 4.8 - 5.6 % Final    Comment:    (NOTE) Pre diabetes:          5.7%-6.4%  Diabetes:              >6.4%  Glycemic control for   <7.0% adults with diabetes     TSH  Date Value Ref Range Status  10/21/2020 16.300 (H) 0.450 - 4.500 uIU/mL Final    Vital Signs: Temp:  [98.1 F (36.7 C)-103 F (39.4 C)] 98.9 F (37.2 C) (08/23 0519) Pulse Rate:  [83-114] 86 (08/23 0519) Cardiac Rhythm: Sinus tachycardia (08/22 2100) Resp:  [16-37] 24 (08/23 0406) BP: (106-178)/(47-106) 125/65 (08/23 0519) SpO2:  [82 %-96 %] 94 % (08/23 0519) FiO2 (%):  [50 %] 50 % (08/23 0406)   Intake/Output Summary (Last 24 hours) at 04/14/2023 0751 Last data filed at 04/14/2023 0500 Gross per 24 hour  Intake 313.94 ml  Output 2450 ml  Net -2136.06 ml    Current Inpatient Medications:    Beta Blocker: Carvedilol 6.25 mg BID           Other vasoactive medications include isosorbide mononitrate 30 mg daily  Prior to admission HF Medications:  Carvedilol 6.25 mg BID Torsemide 80 mg daily Metolazone 2.5 mg as needed  Assessment: 1. Chronic heart failure (LVEF 40-45% in 2020, now improved to 55-60%), due to NICM.  -Asleep in bed today. Per patient's wife, he remains short of breath with continued pain. -Remains on 8L O2. Doubt symptoms are due to HF given normal BNP. -BP WNL. Creatinine stable.  -  Use of SGLT2i with an A1c of 11.3 would increase risk of UTI, can consider when glucose is controlled.  Plan: 1) Medication changes recommended at this time: -None. Agree with IV furosemide equivalent to home torsemide dose to maintain euvolemia.  2) Patient assistance: Sherryll Burger copay is [phone] (in Medicare coverage gap) -Farxiga copay $139.63 (in Medicare coverage gap)  3) Education: -To be completed prior to discharge.  Medication Assistance / Insurance Benefits Check:  Does the patient have prescription insurance?    Type of insurance plan:   Does the patient qualify for medication assistance through manufacturers or grants? Pending. May qualify depending on income.  Outpatient Pharmacy:  Prior to admission outpatient pharmacy: Pending      Thank you for involving pharmacy in this patient's care.  Enos Fling, PharmD, BCPS Phone - [phone] Clinical Pharmacist 04/14/2023 7:51 AM

## 2023-04-14 NOTE — Assessment & Plan Note (Signed)
Likely AKI with history of CKD stage IV.  Creatinine with slight worsening today,   Baseline appears to be around 2.5.   Renal function started improving -Nephrology restarted home torsemide on 8/24 -Monitor renal function -Avoid nephrotoxins

## 2023-04-14 NOTE — Progress Notes (Signed)
[facility]  ROUNDING NOTE   Subjective:   Jason Hudson is a 72 y.o. male with past medical history of chronic systolic heart failure, atrial fibrillation on anticoagulation and chronic kidney disease stage IV.  Patient presents to the emergency department complaining of shortness of breath with chest pain and lower extremity edema.  Patient has been admitted for Shortness of breath [R06.02] CHF (congestive heart failure) (HCC) [I50.9] Edema, unspecified type [R60.9] Chest pain, unspecified type [R07.9]  Patient is known to our practice and is followed outpatient by Dr. Wynelle Link.  Patient was last seen in office on April 04, 2023 for routine follow-up.  Patient seen sitting up in bed, wife at bedside.  States he began having shortness of breath this past weekend but increased to his home regimen of torsemide without relief of symptoms.  Patient is prescribed home oxygen, he is currently on 8 L high flow nasal cannula.  Massive lower extremity edema noted.  Labs on ED arrival include sodium 130, glucose 459, BUN 44, creatinine 3.13 with GFR 21, calcium 11.3. Troponin 20. Hgb A1c 11.3.  Respiratory panel negative for COVID-19.  Chest x-ray no atelectasis and enlarged pericardial silhouette.  Echo completed on admission showed EF 55 to 60% without exclusion of left ventricular diastolic function.  IV Lasix received in the emergency department.  We have been consulted to evaluate monitor acute kidney injury.   Objective:  Vital signs in last 24 hours:  Temp:  [98 F (36.7 C)-103 F (39.4 C)] 98.6 F (37 C) (08/23 1422) Pulse Rate:  [74-114] 80 (08/23 1422) Resp:  [16-29] 20 (08/23 1422) BP: (100-156)/(40-78) 132/64 (08/23 1422) SpO2:  [82 %-99 %] 95 % (08/23 1422) FiO2 (%):  [50 %] 50 % (08/23 0406)  Weight change:  Filed Weights   04/13/23 0153  Weight: (!) 145.2 kg    Intake/Output: I/O last 3 completed shifts: In: 313.9 [P.O.:60; I.V.:3; IV  Piggyback:250.9] Out: 2450 [Urine:2450]   Intake/Output this shift:  Total I/O In: 150 [P.O.:150] Out: 650 [Urine:650]  Physical Exam: General: Ill-appearing  Head: Normocephalic, atraumatic. Moist oral mucosal membranes  Eyes: Anicteric  Neck: Supple  Lungs:  Clear bilaterally, high flow nasal cannula  Heart: Regular rate and rhythm  Abdomen:  Soft, nontender, obese  Extremities: 1-2+ peripheral edema.  Neurologic: Nonfocal, moving all four extremities  Skin: No lesions  Access: None    Basic Metabolic Panel: Recent Labs  Lab 04/12/23 1305 04/13/23 0148 04/14/23 0515  NA 130* 133* 136  K 4.7 4.3 4.2  CL 89* 89* 89*  CO2 26 30 30   GLUCOSE 459* 284* 129*  BUN 44* 48* 57*  CREATININE 3.13* 3.21* 3.27*  CALCIUM 11.3* 12.0* 11.3*  MG  --   --  1.6*    Liver Function Tests: Recent Labs  Lab 04/13/23 0148  AST 31  ALT 25  ALKPHOS 81  BILITOT 0.9  PROT 8.3*  ALBUMIN 3.9   Recent Labs  Lab 04/13/23 0148  LIPASE 28   No results for input(s): "AMMONIA" in the last 168 hours.  CBC: Recent Labs  Lab 04/12/23 1305 04/13/23 0148 04/14/23 0515  WBC 16.3* 20.6* 31.1*  HGB 12.5* 13.6 11.7*  HCT 38.0* 40.8 36.0*  MCV 93.4 95.1 95.7  PLT 196 226 187    Cardiac Enzymes: Recent Labs  Lab 04/12/23 1535  CKTOTAL 82    BNP: Invalid input(s): "POCBNP"  CBG: Recent Labs  Lab 04/13/23 1216 04/13/23 1653 04/13/23 2117 04/14/23 0919 04/14/23  1149  GLUCAP 325* 321* 172* 156* 181*    Microbiology: Results for orders placed or performed during the hospital encounter of 04/12/23  SARS Coronavirus 2 by RT PCR (hospital order, performed in [facility] hospital lab) *cepheid single result test* Anterior Nasal Swab     Status: None   Collection Time: 04/13/23 10:53 AM   Specimen: Anterior Nasal Swab  Result Value Ref Range Status   SARS Coronavirus 2 by RT PCR NEGATIVE NEGATIVE Final    Comment: (NOTE) SARS-CoV-2 target nucleic acids are NOT  DETECTED.  The SARS-CoV-2 RNA is generally detectable in upper and lower respiratory specimens during the acute phase of infection. The lowest concentration of SARS-CoV-2 viral copies this assay can detect is 250 copies / mL. A negative result does not preclude SARS-CoV-2 infection and should not be used as the sole basis for treatment or other patient management decisions.  A negative result may occur with improper specimen collection / handling, submission of specimen other than nasopharyngeal swab, presence of viral mutation(s) within the areas targeted by this assay, and inadequate number of viral copies (<250 copies / mL). A negative result must be combined with clinical observations, patient history, and epidemiological information.  Fact Sheet for Patients:   RoadLapTop.co.za  Fact Sheet for Healthcare Providers: http://kim-miller.com/  This test is not yet approved or  cleared by the [place] FDA and has been authorized for detection and/or diagnosis of SARS-CoV-2 by FDA under an Emergency Use Authorization (EUA).  This EUA will remain in effect (meaning this test can be used) for the duration of the COVID-19 declaration under Section 564(b)(1) of the Act, 21 U.S.C. section 360bbb-3(b)(1), unless the authorization is terminated or revoked sooner.  Performed at [facility], [address] [place], [place] [address]   Respiratory (~20 pathogens) panel by PCR     Status: None   Collection Time: 04/14/23  2:30 AM   Specimen: Nasopharyngeal Swab; Respiratory  Result Value Ref Range Status   Adenovirus NOT DETECTED NOT DETECTED Final   Coronavirus 229E NOT DETECTED NOT DETECTED Final    Comment: (NOTE) The Coronavirus on the Respiratory Panel, DOES NOT test for the novel  Coronavirus (2019 nCoV)    Coronavirus HKU1 NOT DETECTED NOT DETECTED Final   Coronavirus NL63 NOT DETECTED NOT DETECTED Final   Coronavirus  OC43 NOT DETECTED NOT DETECTED Final   Metapneumovirus NOT DETECTED NOT DETECTED Final   Rhinovirus / Enterovirus NOT DETECTED NOT DETECTED Final   Influenza A NOT DETECTED NOT DETECTED Final   Influenza B NOT DETECTED NOT DETECTED Final   Parainfluenza Virus 1 NOT DETECTED NOT DETECTED Final   Parainfluenza Virus 2 NOT DETECTED NOT DETECTED Final   Parainfluenza Virus 3 NOT DETECTED NOT DETECTED Final   Parainfluenza Virus 4 NOT DETECTED NOT DETECTED Final   Respiratory Syncytial Virus NOT DETECTED NOT DETECTED Final   Bordetella pertussis NOT DETECTED NOT DETECTED Final   Bordetella Parapertussis NOT DETECTED NOT DETECTED Final   Chlamydophila pneumoniae NOT DETECTED NOT DETECTED Final   Mycoplasma pneumoniae NOT DETECTED NOT DETECTED Final    Comment: Performed at [facility], [address] [place], [place] [address]  MRSA Next Gen by PCR, Nasal     Status: None   Collection Time: 04/14/23  2:30 AM   Specimen: Nasal Mucosa; Nasal Swab  Result Value Ref Range Status   MRSA by PCR Next Gen NOT DETECTED NOT DETECTED Final    Comment: (NOTE) The GeneXpert  MRSA Assay (FDA approved for NASAL specimens only), is one component of a comprehensive MRSA colonization surveillance program. It is not intended to diagnose MRSA infection nor to guide or monitor treatment for MRSA infections. Test performance is not FDA approved in patients less than 57 years old. Performed at [facility], [address] [place], [place] [address]     Coagulation Studies: No results for input(s): "LABPROT", "INR" in the last 72 hours.  Urinalysis: No results for input(s): "COLORURINE", "LABSPEC", "PHURINE", "GLUCOSEU", "HGBUR", "BILIRUBINUR", "KETONESUR", "PROTEINUR", "UROBILINOGEN", "NITRITE", "LEUKOCYTESUR" in the last 72 hours.  Invalid input(s): "APPERANCEUR"    Imaging: ECHOCARDIOGRAM COMPLETE  Result Date: 04/14/2023    ECHOCARDIOGRAM REPORT   Patient Name:   Jason Hudson  [facility] Date of Exam: 04/14/2023 Medical Rec #:  [id number]              Height:       68.0 in Accession #:    [id number]             Weight:       320.0 lb Date of Birth:  November 18, 1950              BSA:          2.496 m Patient Age:    72 years               BP:           125/65 mmHg Patient Gender: M                      HR:           86 bpm. Exam Location:  [facility] Procedure: 2D Echo, Cardiac Doppler and Color Doppler Indications:     CHF--acute systolic I50.21  History:         Patient has prior history of Echocardiogram examinations, most                  recent 10/22/2017. CHF, COPD; Risk Factors:Diabetes and                  Hypertension.  Sonographer:     Cristela Blue Referring Phys:  [id number] Emeline General Diagnosing Phys: Clotilde Dieter  Sonographer Comments: Technically challenging study due to limited acoustic windows, no apical window and no subcostal window. Image acquisition challenging due to patient body habitus. IMPRESSIONS  1. Left ventricular ejection fraction, by estimation, is 55 to 60%. Left ventricular ejection fraction by PLAX is 63 %. The left ventricle has normal function. The left ventricle has no regional wall motion abnormalities. Left ventricular diastolic function could not be evaluated.  2. Right ventricular systolic function is normal. The right ventricular size is normal.  3. The mitral valve is grossly normal. No evidence of mitral valve regurgitation.  4. The aortic valve is grossly normal. Aortic valve regurgitation is not visualized. FINDINGS  Left Ventricle: Left ventricular ejection fraction, by estimation, is 55 to 60%. Left ventricular ejection fraction by PLAX is 63 %. The left ventricle has normal function. The left ventricle has no regional wall motion abnormalities. The left ventricular internal cavity size was normal in size. There is no left ventricular hypertrophy. Left ventricular diastolic function could not be evaluated. Right Ventricle: The right ventricular size is normal.  No increase in right ventricular wall thickness. Right ventricular systolic function is normal. Left Atrium: Left atrial size was normal in size. Right Atrium: Right atrial size was normal in  size. Pericardium: There is no evidence of pericardial effusion. Mitral Valve: The mitral valve is grossly normal. No evidence of mitral valve regurgitation. Tricuspid Valve: The tricuspid valve is grossly normal. Tricuspid valve regurgitation is not demonstrated. Aortic Valve: The aortic valve is grossly normal. Aortic valve regurgitation is not visualized. Pulmonic Valve: The pulmonic valve was grossly normal. Pulmonic valve regurgitation is not visualized. Aorta: The aortic root is normal in size and structure. IAS/Shunts: The interatrial septum was not assessed.  LEFT VENTRICLE PLAX 2D LV EF:         Left ventricular ejection fraction by PLAX is 63 %. LVIDd:         3.60 cm LVIDs:         2.40 cm LV PW:         1.40 cm LV IVS:        1.70 cm LVOT diam:     2.30 cm LVOT Area:     4.15 cm  LEFT ATRIUM         Index LA diam:    3.00 cm 1.20 cm/m   AORTA Ao Root diam: 3.50 cm  SHUNTS Systemic Diam: 2.30 cm Rozell Searing Custovic Electronically signed by Clotilde Dieter Signature Date/Time: 04/14/2023/9:18:37 AM    Final    US Venous Img Lower Bilateral (DVT)  Result Date: 04/13/2023 CLINICAL DATA:  Bilateral lower extremity pain and swelling. 3-4 days EXAM: Bilateral LOWER EXTREMITY VENOUS DOPPLER ULTRASOUND TECHNIQUE: Gray-scale sonography with compression, as well as color and duplex ultrasound, were performed to evaluate the deep venous system(s) from the level of the common femoral vein through the popliteal and proximal calf veins. COMPARISON:  None Available. FINDINGS: VENOUS Normal compressibility of the common femoral, superficial femoral, greater saphenous vein and popliteal veins, as well as the visualized calf veins. Patient had difficulty tolerating the procedure as per the sonographer. There is poor visualization of  the profunda femoral veins bilaterally and some of the calf veins. No filling defects to suggest DVT on grayscale or color Doppler imaging. Doppler waveforms show normal direction of venous flow, normal respiratory plasticity and response to augmentation. Limited views of the contralateral common femoral vein are unremarkable. OTHER None. Limitations: Difficulty tolerating the procedure as per the sonographer. Limited visualization as well with overlapping soft tissue IMPRESSION: Limited examination as above. No evidence of DVT bilaterally of the lower extremities above the knee of the visualized vessels. Electronically Signed   By: Karen Kays M.D.   On: 04/13/2023 15:18     Medications:    sodium chloride     azithromycin 500 mg (04/14/23 0932)   ceFEPime (MAXIPIME) IV 2 g (04/14/23 0943)   lactated ringers 100 mL/hr at 04/14/23 0930    allopurinol  100 mg Oral Daily   amiodarone  200 mg Oral Daily   aspirin EC  81 mg Oral Daily   carvedilol  6.25 mg Oral BID WC   doxazosin  4 mg Oral QHS   fluticasone  2 spray Each Nare BID   fluticasone furoate-vilanterol  1 puff Inhalation Daily   insulin aspart  0-20 Units Subcutaneous TID WC   insulin aspart  0-5 Units Subcutaneous QHS   insulin aspart  20 Units Subcutaneous TID WC   insulin glargine-yfgn  35 Units Subcutaneous BID   isosorbide mononitrate  30 mg Oral Daily   levothyroxine  112 mcg Oral Q0600   lidocaine  1 patch Transdermal Q24H   linagliptin  5 mg Oral Daily   living  well with diabetes book   Does not apply Once   loratadine  10 mg Oral Daily   sodium chloride flush  3 mL Intravenous Q12H   sodium chloride, acetaminophen, albuterol, cyclobenzaprine, ipratropium, LORazepam, morphine injection, ondansetron (ZOFRAN) IV, oxyCODONE, sodium chloride flush, traZODone  Assessment/ Plan:  Jason Hudson is a 72 y.o.  male with past medical history of chronic systolic heart failure, atrial fibrillation on anticoagulation  and chronic kidney disease stage IV.  Patient presents to the emergency department complaining of shortness of breath with chest pain and lower extremity edema.  Patient has been admitted for Shortness of breath [R06.02] CHF (congestive heart failure) (HCC) [I50.9] Edema, unspecified type [R60.9] Chest pain, unspecified type [R07.9]   Acute Kidney Injury on chronic kidney disease stage IV with baseline creatinine 2.5 and GFR of 27 on 03/20/23.  Acute kidney injury secondary to fluid overload and aggressive diuresis Chronic kidney disease is secondary to poor diabetic control Awaiting renal ultrasound.  No IV contrast exposure.  Diuretics currently held.  Gentle hydration in place.  Agree with current plan.  No acute indication for dialysis.  Will monitor for now.  Lab Results  Component Value Date   CREATININE 3.27 (H) 04/14/2023   CREATININE 3.21 (H) 04/13/2023   CREATININE 3.13 (H) 04/12/2023    Intake/Output Summary (Last 24 hours) at 04/14/2023 1531 Last data filed at 04/14/2023 1528 Gross per 24 hour  Intake 463.94 ml  Output 3100 ml  Net -2636.06 ml    2.  Acute on chronic diastolic heart failure.  Echo completed this admission shows EF 55 to 60%.  Diuretics held due to kidney injury.  3. Anemia of chronic kidney disease Lab Results  Component Value Date   HGB 11.7 (L) 04/14/2023    Hemoglobin within acceptable range.  4. 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 Home regimen includes [place]. Most recent hemoglobin A1c is 11.3 on 04/13/23.     LOS: 2 Wendee Beavers 8/23/20243:31 PM

## 2023-04-14 NOTE — Assessment & Plan Note (Signed)
Worsening leukocytosis.  Procalcitonin at 0.36>>2.02. Chest x-ray with questionable infiltrate/atelectasis. -Started him on ceftriaxone and Zithromax for CAP coverage, he became febrile with worsening leukocytosis, ceftriaxone is being switched to cefepime -Monitor CBC

## 2023-04-14 NOTE — Assessment & Plan Note (Signed)
Staphylococcus lugdunensis bacteremia. T5-T6 discitis and osteomyelitis. Patient now meeting sepsis criteria with fever, worsening leukocytosis, and tachypnea. 1/4 anaerobic blood cultures from 8/23 became positive after 4 days with staph lugdunensis, MRI thoracic spine concern of discitis and osteomyelitis at T5-T6 level but there is no drainable fluid to confirm. In initially received 5 days of cefepime and Zithromycin for concern of pneumonia Started on cefazolin due to positive blood cultures after 60 hours. Repeat blood cultures were sent on 04/18/2023-negative so far -Patient will need 6 weeks of IV antibiotics -If repeat blood cultures remain negative by tomorrow we will order PICC line

## 2023-04-14 NOTE — Consult Note (Signed)
[facility] Customer service manager [facility]) Accountable Care Organization (ACO) [facility] Liaison Note  04/14/2023  Jason Hudson [facility] Jul 21, 1951 [id number]  Location: [facility] RN Hospital Liaison screened the patient remotely at [facility].  Insurance: [employer]   Jason Hudson is a 72 y.o. male who is a Primary Care Patient of Enid Baas, MD [facility]). The patient was screened for readmission hospitalization with noted high risk score for unplanned readmission risk with 1 IP/1 ED in 6 months.  The patient was assessed for potential [facility]) Care Management service needs for post hospital transition for care coordination. Review of patient's electronic medical record reveals patient was admitted with chest pain. [facility]) indicates pt active with [facility] for care management services. No anticipated needs for liaison to engage at this time.    [facility] Care Management/Population Health does not replace or interfere with any arrangements made by the Inpatient Transition of Care team.   For questions contact:   Elliot Cousin, RN, [facility] Liaison [place]   Population Health Office Hours MTWF  8:00 am-6:00 pm Off on Thursday [phone] mobile [phone] [Office toll free line] Office Hours are M-F 8:30 - 5 pm [email or web address]

## 2023-04-14 NOTE — Evaluation (Signed)
Physical Therapy Evaluation Patient Details Name: Jason Hudson MRN: [id number] DOB: 1951/02/11 Today's Date: 04/14/2023  History of Present Illness  Jason Hudson is a 72 y.o. male with a history of hypertension, diabetes, CKD, CAD, dismal atrial fibrillation, CHF, amyloid cyst, and COPD who presents with pain, acute onset today, described as a cramping sensation also associated with tightness in the chest.  He reports increased shortness of breath over the last several days and states he can no longer lie flat.  He also has increased leg swelling bilaterally.  He denies any fever or cough.  He has no vomiting or diarrhea.  The patient states he took his morning medications and did not feel any better.  He does feel slightly better now after nitro and aspirin from EMS.   Clinical Impression  Pt admitted with above diagnosis. Pt currently with functional limitations due to the deficits listed below (see PT Problem List). Pt received upright in bed agreeable to PT eval. Resting on 8L/min HFNC with SPO2 at 90% and HR in mid 80's BPM. Pt reports PTA being independent with gait to mod-I with intermittent SPC use and relied on significant other for toileting ADL's.   To date, pt required minA and multiple bed features to transfer from supine to sitting with mild dizziness upon sitting due to position change which improves with roughly ~2-3 minutes sitting. Pt requiring set up for donning gown. Reviewed sitting therex to assist in edema management in setting of LE swelling. With bed elevated and minA on RW pt able to stand with VC's for hand placement. Pt able to stand ~ 5 minutes requiring set up assist at urinal to urinate due to need of BUE's on RW due to LE weakness. Able to take x3 lateral side steps to [facility] with notable tremors of LE's before requesting to have a seat. SPO2 and HR remain at 90% and mid 80's BPM throughout activity. DOE noted with minimal activity due to acute  deconditioning. Pt left sitting EOB with RN in room and call bell in reach. Pt will benefit from continued acute PT to address LE weakness and SOB to maximize return to PLOF.     If plan is discharge home, recommend the following: Two people to help with walking and/or transfers;A lot of help with bathing/dressing/bathroom;Assist for transportation;Assistance with cooking/housework   Can travel by private vehicle   No    Equipment Recommendations Other (comment) (TBD by next venue)  Recommendations for Other Services       Functional Status Assessment Patient has had a recent decline in their functional status and demonstrates the ability to make significant improvements in function in a reasonable and predictable amount of time.     Precautions / Restrictions Precautions Precautions: Fall Restrictions Weight Bearing Restrictions: No      Mobility  Bed Mobility Overal bed mobility: Needs Assistance Bed Mobility: Supine to Sit     Supine to sit: Min assist, HOB elevated, Used rails     General bed mobility comments: increased time and effort. Multiple bouts. Sleeps in recliner at baseline. Patient Response: Cooperative  Transfers Overall transfer level: Needs assistance Equipment used: Rolling walker (2 wheels) Transfers: Sit to/from Stand Sit to Stand: Min assist, From elevated surface           General transfer comment: minA to stabilize RW due to poor hand placement.    Ambulation/Gait     Assistive device: Rolling walker (2 wheels) Gait Pattern/deviations: Step-to pattern  General Gait Details: L lateral side steps x3 towards HOB. Effortful with LE's tremulous.  Stairs            Wheelchair Mobility     Tilt Bed Tilt Bed Patient Response: Cooperative  Modified Rankin (Stroke Patients Only)       Balance Overall balance assessment: Needs assistance Sitting-balance support: Bilateral upper extremity supported, Feet  supported Sitting balance-Leahy Scale: Fair     Standing balance support: Bilateral upper extremity supported, During functional activity, Reliant on assistive device for balance Standing balance-Leahy Scale: Poor Standing balance comment: Requires heavy BUE support on RW.                             Pertinent Vitals/Pain Pain Assessment Pain Assessment: No/denies pain    Home Living Family/patient expects to be discharged to:: Private residence Living Arrangements: Spouse/significant other Available Help at Discharge: Family;Available 24 hours/day Type of Home: House Home Access: Stairs to enter Entrance Stairs-Rails: Right;Left;Can reach both Entrance Stairs-Number of Steps: 4   Home Layout: One level Home Equipment: Cane - single point      Prior Function Prior Level of Function : Independent/Modified Independent;Needs assist       Physical Assist : ADLs (physical)   ADLs (physical): Toileting   ADLs Comments: Requires assist from significant other for pericare.     Extremity/Trunk Assessment   Upper Extremity Assessment Upper Extremity Assessment: Overall WFL for tasks assessed    Lower Extremity Assessment Lower Extremity Assessment: Generalized weakness    Cervical / Trunk Assessment Cervical / Trunk Assessment: Normal  Communication   Communication Communication: No apparent difficulties Cueing Techniques: Verbal cues;Tactile cues  Cognition Arousal: Alert Behavior During Therapy: WFL for tasks assessed/performed Overall Cognitive Status: Within Functional Limits for tasks assessed                                          General Comments General comments (skin integrity, edema, etc.): SPO2: 89-90% on 8 L/min HFNC. Max HR: 86 BPM    Exercises General Exercises - Lower Extremity Ankle Circles/Pumps: AROM, Strengthening, Right, Left, 10 reps, Seated Long Arc Quad: AROM, Strengthening, 5 reps, Seated Other  Exercises Other Exercises: Role of PT in acute setting, LE exercises to assist in circulation/edema management, PLB.   Assessment/Plan    PT Assessment Patient needs continued PT services  PT Problem List Decreased strength;Decreased activity tolerance;Decreased balance;Cardiopulmonary status limiting activity;Decreased safety awareness;Decreased knowledge of use of DME;Obesity       PT Treatment Interventions DME instruction;Gait training;Patient/family education;Stair training;Functional mobility training;Therapeutic activities;Therapeutic exercise;Balance training;Neuromuscular re-education    PT Goals (Current goals can be found in the Care Plan section)  Acute Rehab PT Goals Patient Stated Goal: to improve breathing PT Goal Formulation: With patient Time For Goal Achievement: 04/28/23 Potential to Achieve Goals: Good    Frequency Min 1X/week     Co-evaluation               AM-PAC PT "6 Clicks" Mobility  Outcome Measure Help needed turning from your back to your side while in a flat bed without using bedrails?: A Lot Help needed moving from lying on your back to sitting on the side of a flat bed without using bedrails?: A Lot Help needed moving to and from a bed to a chair (including a wheelchair)?: A Lot  Help needed standing up from a chair using your arms (e.g., wheelchair or bedside chair)?: A Lot Help needed to walk in hospital room?: Total Help needed climbing 3-5 steps with a railing? : Total 6 Click Score: 10    End of Session Equipment Utilized During Treatment: Gait belt Activity Tolerance: Patient tolerated treatment well Patient left: in bed;with bed alarm set;with call bell/phone within reach;with nursing/sitter in room (sitting EOB) Nurse Communication: Mobility status PT Visit Diagnosis: Difficulty in walking, not elsewhere classified (R26.2);Other abnormalities of gait and mobility (R26.89);Muscle weakness (generalized) (M62.81)    Time:  0272-5366 PT Time Calculation (min) (ACUTE ONLY): 23 min   Charges:   PT Evaluation $PT Eval Moderate Complexity: 1 Mod PT Treatments $Therapeutic Activity: 8-22 mins PT General Charges $$ ACUTE PT VISIT: 1 Visit         Delphia Grates. Fairly IV, PT, DPT Physical Therapist- [place]  [facility]  04/14/2023, 2:40 PM

## 2023-04-14 NOTE — Assessment & Plan Note (Signed)
-   BiPAP at night, patient is mostly noncompliant at home

## 2023-04-14 NOTE — Significant Event (Signed)
Patient is more agitated, HR increased, diaphoretic, and has a temp of 100.6. Continuing to attempt to get out of bed against staff recommendations. Manuela Schwartz, NP contacted. New orders placed for respiratory virus panel, ABG, and Bipap. Respiratory at bedside to place patient on Bipap.

## 2023-04-14 NOTE — Progress Notes (Addendum)
Does not appear to be AECHF, clinical presentation more consistent with sepsis. Cardiology will sign off at this time. Please call with any questions or concerns. Echo shows preserved LVEF.   Clotilde Dieter, DO 04/14/23 11:41 AM  [facility] CLINIC CARDIOLOGY CONSULT NOTE       Patient ID: Jandre Saldierna MRN: [id number] DOB/AGE: 1951-03-30 72 y.o.  Admit date: 04/12/2023 Referring Physician Dr. Arnetha Courser  Primary Physician Dr. Nemiah Commander Primary Cardiologist Dr. Juliann Pares Reason for Consultation ?heart failure exacerbation   HPI: Guthrie Crisp is a 72 y.o. male  with a past medical history of chronic HFrEF with recovered EF on recent cardiac MRI, paroxysmal atrial fibrillation not on anticoagulation, coronary artery disease LHC 2019 with CTO of mid RCA, hypertension, hyperlipidemia chronic kidney disease stage IV, COPD, type 2 diabetes, hypothyroidism, morbid obesity who presented to the ED on 04/12/2023 for crampy chest pain, shortness of breath.  Cardiology was consulted for further evaluation of possible heart failure exacerbation.  Interval History: -Patient resting comfortably at the time of my exam, was on BiPAP overnight and into the morning now on 8L [place].  -Diuresing well with IV lasix now net negative 1.9 L, renal function relatively stable.  -Not complaining of any chest pain, palpitations at the time of my exam.  Review of systems complete and found to be negative unless listed above   Past Medical History:  Diagnosis Date   CHF (congestive heart failure) (HCC)    COPD (chronic obstructive pulmonary disease) (HCC)    Diabetes mellitus without complication (HCC)    Edema    leg swelling   Gastroesophageal reflux disease 12/15/2017   Gout    Hypertension    Melena 11/05/2017   Pneumonia 07/27/2016    Past Surgical History:  Procedure Laterality Date   COLONOSCOPY WITH PROPOFOL N/A 11/07/2017   Procedure: COLONOSCOPY WITH PROPOFOL;  Surgeon: Wyline Mood,  MD;  Location: [facility] ENDOSCOPY;  Service: Gastroenterology;  Laterality: N/A;   ESOPHAGOGASTRODUODENOSCOPY (EGD) WITH PROPOFOL N/A 11/07/2017   Procedure: ESOPHAGOGASTRODUODENOSCOPY (EGD) WITH PROPOFOL;  Surgeon: Wyline Mood, MD;  Location: [facility] ENDOSCOPY;  Service: Gastroenterology;  Laterality: N/A;   LEFT HEART CATH AND CORONARY ANGIOGRAPHY N/A 10/23/2017   Procedure: LEFT HEART CATH AND CORONARY ANGIOGRAPHY;  Surgeon: Marcina Millard, MD;  Location: [facility] INVASIVE CV LAB;  Service: Cardiovascular;  Laterality: N/A;    Medications Prior to Admission  Medication Sig Dispense Refill Last Dose   albuterol (VENTOLIN HFA) 108 (90 Base) MCG/ACT inhaler Inhale 1-2 puffs into the lungs every 6 (six) hours as needed for wheezing or shortness of breath. 1 each 5 prn at unk   allopurinol (ZYLOPRIM) 100 MG tablet TAKE ONE TABLET (100 MG) BY MOUTH ONCE DAILY 90 tablet 1 04/11/2023   amiodarone (PACERONE) 200 MG tablet Take 1 tablet (200 mg total) by mouth daily for 30 days. 30 tablet 0 04/12/2023   aspirin EC 81 MG tablet Take 81 mg by mouth daily. Swallow whole.   04/12/2023   budesonide-formoterol (SYMBICORT) 160-4.5 MCG/ACT inhaler Inhale 2 puffs into the lungs 2 (two) times daily.   04/11/2023   calcitRIOL (ROCALTROL) 0.25 MCG capsule Take 0.25 mcg by mouth daily.   04/11/2023   carvedilol (COREG) 6.25 MG tablet Take 6.25 mg by mouth 2 (two) times daily with a meal. Pt gets from cardiologist   04/11/2023   Cholecalciferol (VITAMIN D3) 50 MCG (2000 UT) TABS Take 2,000 Units by mouth at bedtime. 30 tablet 2 04/11/2023 at pm   colchicine 0.6  MG tablet Day 1: take 2 tablets by mouth now then take 1 more tablet by mouth 1 hour later.  After day 1, take 1 tablet daily until gout flare is resolved.  May repeat process as needed for future gout flare 30 tablet 3 prn at unk   diphenhydrAMINE (BENADRYL) 25 mg capsule Take 25 mg by mouth every 6 (six) hours as needed for itching or allergies.   prn at unk   doxazosin  (CARDURA) 4 MG tablet TAKE 1 TABLET BY MOUTH AT BEDTIME 30 tablet 3 04/11/2023 at pm   glipiZIDE (GLUCOTROL XL) 5 MG 24 hr tablet TAKE ONE TABLET EACH MORNING WITH BREAKFAST 30 tablet 2 04/11/2023 at pm   insulin degludec (TRESIBA) 200 UNIT/ML FlexTouch Pen Inject 120 Units into the skin daily before breakfast. 30 mL 5 04/11/2023 at am   ipratropium (ATROVENT) 0.02 % nebulizer solution Take 2.5 mLs (0.5 mg total) by nebulization every 6 (six) hours as needed for wheezing or shortness of breath. 225 mL 1 prn at unk   isosorbide mononitrate (IMDUR) 30 MG 24 hr tablet Take 30 mg by mouth daily.   04/12/2023   levothyroxine (SYNTHROID) 112 MCG tablet Take 112 mcg by mouth daily before breakfast.   04/11/2023   loratadine (CLARITIN) 10 MG tablet Take 10 mg by mouth daily.    04/11/2023   multivitamin (ONE-A-DAY MEN'S) TABS tablet Take 1 tablet by mouth daily.   04/11/2023   nitroGLYCERIN (NITROSTAT) 0.4 MG SL tablet Place 1 tablet (0.4 mg total) under the tongue every 5 (five) minutes as needed for chest pain. 30 tablet 0 prn at unk   pantoprazole (PROTONIX) 40 MG tablet Take 40 mg by mouth daily.   04/11/2023 at pm   potassium chloride (KLOR-CON) 10 MEQ tablet TAKE TWO TABLETS EVERY OTHER DAY AND 1 TABLET ON THE DAYS IN BETWEEN (Patient taking differently: 30 mEq daily.) 180 tablet 1 04/11/2023   torsemide (DEMADEX) 20 MG tablet Take 80 mg by mouth daily.   04/11/2023   TRESIBA FLEXTOUCH 100 UNIT/ML FlexTouch Pen INJECT 70 UNITS SQ TWICE A DAY (Patient taking differently: Inject 120 Units into the skin daily.) 45 mL 3 04/11/2023 at am   albuterol (PROVENTIL) (2.5 MG/3ML) 0.083% nebulizer solution Take 3 mLs (2.5 mg total) by nebulization every 6 (six) hours as needed for wheezing or shortness of breath. (Patient not taking: Reported on 04/12/2023) 150 mL 1 Not Taking   fluticasone (FLONASE) 50 MCG/ACT nasal spray Place 2 sprays into both nostrils 2 (two) times daily.  (Patient not taking: Reported on 04/12/2023)   Not  Taking   levothyroxine (SYNTHROID) 88 MCG tablet TAKE ONE TABLET EACH MORNING BEFORE BREAKFAST (Patient not taking: Reported on 04/12/2023) 90 tablet 1 Not Taking   metolazone (ZAROXOLYN) 5 MG tablet Take 5 mg by mouth daily. (Patient not taking: Reported on 04/12/2023)   Not Taking   oxyCODONE (OXY IR/ROXICODONE) 5 MG immediate release tablet Take one tab po qd prn for pain (Patient not taking: Reported on 04/13/2023) 30 tablet 0 Not Taking at unk   predniSONE (DELTASONE) 20 MG tablet 3 tablets daily x 4 days (Patient not taking: Reported on 04/12/2023) 12 tablet 0 Not Taking   traZODone (DESYREL) 50 MG tablet TAKE ONE TABLET AT BEDTIME AS NEEDED FORSLEEP (Patient not taking: Reported on 04/12/2023) 30 tablet 1 Not Taking   Social History   Socioeconomic History   Marital status: Single    Spouse name: Not on file   Number  of children: Not on file   Years of education: Not on file   Highest education level: Not on file  Occupational History   Not on file  Tobacco Use   Smoking status: Never   Smokeless tobacco: Never  Substance and Sexual Activity   Alcohol use: Not Currently    Comment: ocassionally   Drug use: Never   Sexual activity: Not on file  Other Topics Concern   Not on file  Social History Narrative   Not on file   Social Determinants of Health   Financial Resource Strain: Not on file  Food Insecurity: No Food Insecurity (04/13/2023)   Hunger Vital Sign    Worried About Running Out of Food in the Last Year: Never true    Ran Out of Food in the Last Year: Never true  Transportation Needs: No Transportation Needs (04/13/2023)   PRAPARE - Administrator, Civil Service (Medical): No    Lack of Transportation (Non-Medical): No  Physical Activity: Not on file  Stress: Not on file  Social Connections: Not on file  Intimate Partner Violence: Not At Risk (04/13/2023)   Humiliation, Afraid, Rape, and Kick questionnaire    Fear of Current or Ex-Partner: No     Emotionally Abused: No    Physically Abused: No    Sexually Abused: No    Family History  Problem Relation Age of Onset   Cancer Mother    CVA Father    Heart disease Father      Vitals:   04/14/23 0257 04/14/23 0406 04/14/23 0519 04/14/23 1018  BP:   125/65 (!) 116/52  Pulse: (!) 108  86 81  Resp: (!) 29 (!) 24  18  Temp:   98.9 F (37.2 C) 98.1 F (36.7 C)  TempSrc:   Oral   SpO2: 93%  94% 98%  Weight:      Height:        PHYSICAL EXAM General: Chronically ill-appearing elderly male, well nourished, in no acute distress. Resting comfortably upright in hospital bed.  HEENT: Normocephalic and atraumatic. Neck: No JVD.  Lungs: Normal respiratory effort on 8L. Diminished breath sounds bilaterally.  Heart: HRRR. Normal S1 and S2 without gallops or murmurs.  Abdomen: Non-distended appearing.  Msk: Normal strength and tone for age. Extremities: Warm and well perfused. No clubbing, cyanosis. Chronic, tense edema to bilateral LE with associated erythema, mildly improved from yesterday.  Neuro: Alert and oriented X 3. Psych: Answers questions appropriately.   Labs: Basic Metabolic Panel: Recent Labs    04/13/23 0148 04/14/23 0515  NA 133* 136  K 4.3 4.2  CL 89* 89*  CO2 30 30  GLUCOSE 284* 129*  BUN 48* 57*  CREATININE 3.21* 3.27*  CALCIUM 12.0* 11.3*  MG  --  1.6*   Liver Function Tests: Recent Labs    04/13/23 0148  AST 31  ALT 25  ALKPHOS 81  BILITOT 0.9  PROT 8.3*  ALBUMIN 3.9   Recent Labs    04/13/23 0148  LIPASE 28   CBC: Recent Labs    04/13/23 0148 04/14/23 0515  WBC 20.6* 31.1*  HGB 13.6 11.7*  HCT 40.8 36.0*  MCV 95.1 95.7  PLT 226 187   Cardiac Enzymes: Recent Labs    04/12/23 1535 04/13/23 0148 04/13/23 1845  CKTOTAL 82  --   --   TROPONINIHS 36* 43* 32*   BNP: Recent Labs    04/12/23 1305  BNP 89.7   D-Dimer: Recent  Labs    04/13/23 0148  DDIMER 0.86*   Hemoglobin A1C: Recent Labs    04/13/23 0148  HGBA1C  11.3*   Fasting Lipid Panel: No results for input(s): "CHOL", "HDL", "LDLCALC", "TRIG", "CHOLHDL", "LDLDIRECT" in the last 72 hours. Thyroid Function Tests: No results for input(s): "TSH", "T4TOTAL", "T3FREE", "THYROIDAB" in the last 72 hours.  Invalid input(s): "FREET3" Anemia Panel: No results for input(s): "VITAMINB12", "FOLATE", "FERRITIN", "TIBC", "IRON", "RETICCTPCT" in the last 72 hours.   Radiology: ECHOCARDIOGRAM COMPLETE  Result Date: 04/14/2023    ECHOCARDIOGRAM REPORT   Patient Name:   COLTAN FRANCHETTI [facility] Date of Exam: 04/14/2023 Medical Rec #:  [id number]              Height:       68.0 in Accession #:    [id number]             Weight:       320.0 lb Date of Birth:  04-30-1951              BSA:          2.496 m Patient Age:    72 years               BP:           125/65 mmHg Patient Gender: M                      HR:           86 bpm. Exam Location:  [facility] Procedure: 2D Echo, Cardiac Doppler and Color Doppler Indications:     CHF--acute systolic I50.21  History:         Patient has prior history of Echocardiogram examinations, most                  recent 10/22/2017. CHF, COPD; Risk Factors:Diabetes and                  Hypertension.  Sonographer:     Cristela Blue Referring Phys:  [id number] Emeline General Diagnosing Phys: Clotilde Dieter  Sonographer Comments: Technically challenging study due to limited acoustic windows, no apical window and no subcostal window. Image acquisition challenging due to patient body habitus. IMPRESSIONS  1. Left ventricular ejection fraction, by estimation, is 55 to 60%. Left ventricular ejection fraction by PLAX is 63 %. The left ventricle has normal function. The left ventricle has no regional wall motion abnormalities. Left ventricular diastolic function could not be evaluated.  2. Right ventricular systolic function is normal. The right ventricular size is normal.  3. The mitral valve is grossly normal. No evidence of mitral valve regurgitation.  4. The aortic  valve is grossly normal. Aortic valve regurgitation is not visualized. FINDINGS  Left Ventricle: Left ventricular ejection fraction, by estimation, is 55 to 60%. Left ventricular ejection fraction by PLAX is 63 %. The left ventricle has normal function. The left ventricle has no regional wall motion abnormalities. The left ventricular internal cavity size was normal in size. There is no left ventricular hypertrophy. Left ventricular diastolic function could not be evaluated. Right Ventricle: The right ventricular size is normal. No increase in right ventricular wall thickness. Right ventricular systolic function is normal. Left Atrium: Left atrial size was normal in size. Right Atrium: Right atrial size was normal in size. Pericardium: There is no evidence of pericardial effusion. Mitral Valve: The mitral valve is grossly normal. No evidence of mitral valve regurgitation. Tricuspid  Valve: The tricuspid valve is grossly normal. Tricuspid valve regurgitation is not demonstrated. Aortic Valve: The aortic valve is grossly normal. Aortic valve regurgitation is not visualized. Pulmonic Valve: The pulmonic valve was grossly normal. Pulmonic valve regurgitation is not visualized. Aorta: The aortic root is normal in size and structure. IAS/Shunts: The interatrial septum was not assessed.  LEFT VENTRICLE PLAX 2D LV EF:         Left ventricular ejection fraction by PLAX is 63 %. LVIDd:         3.60 cm LVIDs:         2.40 cm LV PW:         1.40 cm LV IVS:        1.70 cm LVOT diam:     2.30 cm LVOT Area:     4.15 cm  LEFT ATRIUM         Index LA diam:    3.00 cm 1.20 cm/m   AORTA Ao Root diam: 3.50 cm  SHUNTS Systemic Diam: 2.30 cm Rozell Searing Oda Lansdowne Electronically signed by Clotilde Dieter Signature Date/Time: 04/14/2023/9:18:37 AM    Final    US Venous Img Lower Bilateral (DVT)  Result Date: 04/13/2023 CLINICAL DATA:  Bilateral lower extremity pain and swelling. 3-4 days EXAM: Bilateral LOWER EXTREMITY VENOUS DOPPLER  ULTRASOUND TECHNIQUE: Gray-scale sonography with compression, as well as color and duplex ultrasound, were performed to evaluate the deep venous system(s) from the level of the common femoral vein through the popliteal and proximal calf veins. COMPARISON:  None Available. FINDINGS: VENOUS Normal compressibility of the common femoral, superficial femoral, greater saphenous vein and popliteal veins, as well as the visualized calf veins. Patient had difficulty tolerating the procedure as per the sonographer. There is poor visualization of the profunda femoral veins bilaterally and some of the calf veins. No filling defects to suggest DVT on grayscale or color Doppler imaging. Doppler waveforms show normal direction of venous flow, normal respiratory plasticity and response to augmentation. Limited views of the contralateral common femoral vein are unremarkable. OTHER None. Limitations: Difficulty tolerating the procedure as per the sonographer. Limited visualization as well with overlapping soft tissue IMPRESSION: Limited examination as above. No evidence of DVT bilaterally of the lower extremities above the knee of the visualized vessels. Electronically Signed   By: Karen Kays M.D.   On: 04/13/2023 15:18   DG Chest 2 View  Result Date: 04/12/2023 CLINICAL DATA:  Chest pain EXAM: CHEST - 2 VIEW COMPARISON:  X-ray 01/03/2023 FINDINGS: Under penetrated radiographs enlarged cardiopericardial silhouette. No pneumothorax or edema. No definite pleural effusion. Eventration of the right hemidiaphragm with some adjacent atelectasis or scar. IMPRESSION: Eventration of the right hemidiaphragm with some adjacent atelectasis. Enlarged cardiopericardial silhouette. Electronically Signed   By: Karen Kays M.D.   On: 04/12/2023 14:15   MR CARDIAC MORPHOLOGY W WO CONTRAST  Result Date: 03/15/2023 CLINICAL DATA:  Evaluate for cardiac amyloidosis EXAM: CARDIAC MRI TECHNIQUE: The patient was scanned on a 1.5 Tesla Siemens  magnet. A dedicated cardiac coil was used. Functional imaging was done using Fiesta sequences. 2,3, and 4 chamber views were done to assess for RWMA's. Modified Simpson's rule using a short axis stack was used to calculate an ejection fraction on a dedicated work [employer]. The patient received 18 ml of Gadavist. After 10 minutes inversion recovery sequences were used to assess for infiltration and scar tissue. Velocity flow mapping performed in the ascending aorta and main pulmonary artery. CONTRAST:  18 cc  of Gadavist FINDINGS: 1. Normal left ventricular size, thickness and systolic function (LVEF = 66%). There are no regional wall motion abnormalities. There is no late gadolinium enhancement in the left ventricular myocardium. LV native T1 value 996 ms (normal <1000 ms) LVEDV: 134 ml LVESV: 45 ml SV: 89 ml CO: 7L/min Myocardial mass: 155 g 2. Normal right ventricular size, thickness and systolic function (RVEF = 58%). There are no regional wall motion abnormalities. 3.  Normal left and right atrial size. 4. Normal size of the aortic root, ascending aorta and pulmonary artery. 5.  No significant valvular abnormalities. 6.  Normal pericardium.  No pericardial effusion. IMPRESSION: 1.  Normal LV size and systolic function.  LVEF 66%. 2.  There is no LGE or scar. 3. Normal LV native T1 value, no evidence for myocardial infiltration. 4.  Normal RV size and function. 5.  No evidence for cardiac amyloidosis. Electronically Signed   By: Debbe Odea M.D.   On: 03/15/2023 13:42   MR CARDIAC VELOCITY FLOW MAP  Result Date: 03/15/2023 CLINICAL DATA:  Evaluate for cardiac amyloidosis EXAM: CARDIAC MRI TECHNIQUE: The patient was scanned on a 1.5 Tesla Siemens magnet. A dedicated cardiac coil was used. Functional imaging was done using Fiesta sequences. 2,3, and 4 chamber views were done to assess for RWMA's. Modified Simpson's rule using a short axis stack was used to calculate an ejection  fraction on a dedicated work [employer]. The patient received 18 ml of Gadavist. After 10 minutes inversion recovery sequences were used to assess for infiltration and scar tissue. Velocity flow mapping performed in the ascending aorta and main pulmonary artery. CONTRAST:  18 cc  of Gadavist FINDINGS: 1. Normal left ventricular size, thickness and systolic function (LVEF = 66%). There are no regional wall motion abnormalities. There is no late gadolinium enhancement in the left ventricular myocardium. LV native T1 value 996 ms (normal <1000 ms) LVEDV: 134 ml LVESV: 45 ml SV: 89 ml CO: 7L/min Myocardial mass: 155 g 2. Normal right ventricular size, thickness and systolic function (RVEF = 58%). There are no regional wall motion abnormalities. 3.  Normal left and right atrial size. 4. Normal size of the aortic root, ascending aorta and pulmonary artery. 5.  No significant valvular abnormalities. 6.  Normal pericardium.  No pericardial effusion. IMPRESSION: 1.  Normal LV size and systolic function.  LVEF 66%. 2.  There is no LGE or scar. 3. Normal LV native T1 value, no evidence for myocardial infiltration. 4.  Normal RV size and function. 5.  No evidence for cardiac amyloidosis. Electronically Signed   By: Debbe Odea M.D.   On: 03/15/2023 13:42    ECHO report above  TELEMETRY reviewed by me [facility]) 04/14/2023: sinus rhythm 1st degree AVB rate 70s  EKG reviewed by me: NSR 1st degree AVB, non-ischemic  Data reviewed by me [facility]) 04/14/2023: last 24h vitals tele labs imaging I/O hospitalist progress note, respiratory notes, nursing notes  Principal Problem:   Shortness of breath Active Problems:   Essential hypertension   Obstructive sleep apnea of adult   Acute on chronic systolic congestive heart failure (HCC)   Type 2 diabetes mellitus with chronic kidney disease, with long-term current use of insulin (HCC)   Chronic kidney disease, stage 4 (severe) (HCC)   Chronic obstructive  pulmonary disease (HCC)   Leukocytosis   Chest pain   Paroxysmal atrial fibrillation (HCC)   Obesity, Class III, BMI 40-49.9 (morbid obesity) (HCC)    ASSESSMENT  AND PLAN:  Jason Hudson is a 71 y.o. male  with a past medical history of chronic HFrEF with recovered EF on recent cardiac MRI, paroxysmal atrial fibrillation not on anticoagulation, coronary artery disease, hypertension, hyperlipidemia chronic kidney disease stage IV, COPD, type 2 diabetes, hypothyroidism, morbid obesity who presented to the ED on 04/12/2023 for so crampy chest pain, shortness of breath.  Cardiology was consulted for further evaluation of possible heart failure exacerbation.  # Acute hypoxic respiratory failure # ?Acute on chronic heart failure # Shortness of breath Patient with 2 days of worsening shortness of breath and lower extremity edema.  No improvement with home diuretics.  BNP on admission 89, chest x-ray without overt evidence of volume overload.  Shortness of breath remains and he required BiPAP overnight - he is currently on 8 L nasal cannula.  He has a history of systolic heart failure with EF 40-45% in 2020 now recovered.  Cardiac MRI done 02/2023 with EF 66%. He has diuresed well with IV lasix, net negative 1.9 L at this time. -Echo this hospitalization with preserved EF -IV lasix 80 mg bid -jardiance 25 mg daily. -Recently started on telmisartan by nephrology but this is currently being held.  # Atypical chest pain # Coronary artery disease # Hypertension Patient with 2 days of "cramping" in his chest.  Symptoms worse with laying flat, deep breathing. History of CAD with LHC in 2019 demonstrating CTO of mid RCA with collaterals. -Suspect not cardiac in nature. Monitor for changes in symptoms.  -No plan for cardiac diagnostics at this time. -Continue home imdur 30 mg daily. -Continue home carvedilol 6.25 mg twice daily.   # COPD # Leukocytosis # Presumed CAP -Management per  primary  # Paroxysmal atrial fibrillation Hx PAF in NSR on EKG in the ED. Currently on telemetry. Patient without palpitations.  -Continue home amiodarone 200 mg daily.  -Continue aspirin 81 mg daily. Patient not on anticoagulation due to history of GI bleeds.   # Acute kidney injury on CKD stage IV Labs on presentation creatinine 3.13, GFR 21, BUN 44.  Today creatinine is 3.27, GFR 20, BUN 57. Magnesium today 1.6 -Nephrology consulted by primary -Monitor renal function closely -Monitor and replenish electrolytes for a goal K >4, Mag >2     This patient's plan of care was discussed and created with Dr. Melton Alar and she is in agreement.  Signed: Cheryl Flash, PA-C 04/14/2023, 11:28 AM [facility] Cardiology

## 2023-04-14 NOTE — Assessment & Plan Note (Signed)
Uncontrolled with hyperglycemia and A1c of 11.3.  CBG improved today  Patient is on significant amount of insulin at home. -Continue Semglee to 35 units twice daily instead of 60 daily. -Decreasing mealtime coverage to 5 unit -Continue SSI

## 2023-04-14 NOTE — Consult Note (Signed)
Pharmacy Antibiotic Note  Jason Hudson is a 72 y.o. male with medical history including HFrEF, PAF, CKD stage IV, COPD, IDDM, gout, hypothyroidism, BPH, morbid obesity admitted on 04/12/2023 with pneumonia.  Pharmacy has been consulted for cefepime dosing.  Plan:  Cefepime 2 g IV q24h  Height: 5\' 8"  (172.7 cm) Weight: (!) 145.2 kg (320 lb) IBW/kg (Calculated) : 68.4  Temp (24hrs), Avg:99.6 F (37.6 C), Min:98.1 F (36.7 C), Max:103 F (39.4 C)  Recent Labs  Lab 04/12/23 1305 04/13/23 0148 04/14/23 0515  WBC 16.3* 20.6* 31.1*  CREATININE 3.13* 3.21* 3.27*    Estimated Creatinine Clearance: 29.5 mL/min (A) (by C-G formula based on SCr of 3.27 mg/dL (H)).    Allergies  Allergen Reactions   Red Dye #40 (Allura Red) Anaphylaxis and Other (See Comments)    "not as allergic to it as I used to be. I can take a little of it" "not as allergic to it as I used to be. I can take a little of it" "not as allergic to it as I used to be. I can take a little of it" Other reaction(s): Not available "not as allergic to it as I used to be. I can take a little of it" "not as allergic to it as I used to be. I can take a little of it" "not as allergic to it as I used to be. I can take a little of it" "not as allergic to it as I used to be. I can take a little of it" "not as allergic to it as I used to be. I can take a little of it" "not as allergic to it as I used to be. I can take a little of it" "not as allergic to it as I used to be. I can take a little of it"    Atorvastatin Other (See Comments)    Other reaction(s): Not available Other reaction(s): Other (See Comments)    Atorvastatin Calcium     Antimicrobials this admission: Ceftriaxone 8/22 x 1 Azithromycin 8/22 >>  Cefepime 8/23 >>   Dose adjustments this admission: N/A  Microbiology results: 8/23 BCx: pending 8/22 UCx: pending  8/23 Sputum: pending  8/23 MRSA PCR: (-) 8/23 RVP: (-)  Thank you for allowing  pharmacy to be a part of this patient's care.  Tressie Ellis 04/14/2023 8:38 AM

## 2023-04-14 NOTE — Progress Notes (Signed)
*  PRELIMINARY RESULTS* Echocardiogram 2D Echocardiogram has been performed.  Jason Hudson 04/14/2023, 7:55 AM

## 2023-04-14 NOTE — Progress Notes (Signed)
Progress Note   [name] XBM:[id number] DOB: 1951-03-20 DOA: 04/12/2023     2 DOS: the patient was seen and examined on 04/14/2023   Brief hospital course: Taken from H&P.  Jason Hudson is a 72 y.o. male with medical history significant of chronic HFrEF with LVEF 40-45%, PAF not on anticoagulation due to history of severe GI bleed, CKD stage IV, COPD, IDDM with insulin resistance, gout, hypothyroidism, BPH, morbid obesity, presented with increasing shortness of breath and leg swelling and chest pain.   On presentation patient was hemodynamically stable.  Chest x-ray with eventration of right hemidiaphragm with some adjacent atelectasis.  No pulmonary edema.  Labs pertinent for AKI with creatinine at 3.1, baseline seems to be around 2.1-2.4, leukocytosis at 16.3.  Troponin 20 EKG sinus rhythm, chronic ST changes on multiple leads.  Patient was given 80 mg of IV Lasix in ED and started on IV diuresis. Chest pain appears to be musculoskeletal so he was given a trial of lidocaine patch and Flexeril.  8/22: Overnight persistent bandlike chest pain, which increased with body movements and deep breathing, D-dimer was checked and found to be elevated, patient also has CKD stage IV, VQ scan was ordered by night on-call. Repeat troponin 43, A1c 11.3, slight worsening of renal function with BUN 48 and creatinine 3.21, worsening leukocytosis at 20.6.  BNP normal. Holding IV Lasix today, procalcitonin 0.36, COVID PCR negative.  Significant orthopnea and unable to lay down for any scan.  [facility] cardiology was consulted.  Starting him on ceftriaxone and Zithromax for CAP coverage.  8/23: Patient overnight became more dyspneic and febrile at 103.  ABG with mild hypercapnia and hypoxia, due to increased work of breathing he was placed on BiPAP. Respiratory viral panel and MRSA PCR negative.  Worsening leukocytosis and procalcitonin today.  Urine has not been sent to lab despite  multiple reminders. Ordered blood and sputum culture and switching ceftriaxone with cefepime.  UOP of 2450 recorded, creatinine worsening at 3.27.  Cardiology gave another dose of IV Lasix 80 mg yesterday. Patient now meeting sepsis criteria likely secondary to pneumonia, still unable to obtain further imaging.  Ordered some IV fluid, holding Lasix.      Assessment and Plan: * Sepsis due to pneumonia [facility]) Patient now meeting sepsis criteria with fever, worsening leukocytosis, and tachypnea.  Likely secondary to pneumonia as there is no other source of infection BNP normal so less likely a CHF exacerbation. Significant leukocytosis and procalcitonin 0.36>>2.02.  Pleuritic chest pain. Early pneumonia versus PE is on differential.  D-dimer elevated but patient also has CKD stage IV so that was inconclusive. Will try VQ scan today Initially refused therapeutic Lovenox due to concern of GI bleed and later agrees. -Obtain ABG to rule out any occult hypoxia -Therapeutic Lovenox until we rule out VTE -Start him on ceftriaxone and Zithromax for pneumonia, switching ceftriaxone with cefepime due to worsening leukocytosis and procalcitonin. -CT chest without contrast if he can tolerate. -Lower extremity venous Doppler to rule out DVT -Continue to monitor  Acute on chronic systolic congestive heart failure [facility]) Patient was initially admitted with concern of acute on chronic HFrEF, prior echocardiogram done in 2020 with EF of 40 to 45%.  Repeat echo with normal EF, indeterminate diastolic function. BNP normal and patient has chronic lower extremity edema with signs of chronic venous congestion and stasis.  Do not weigh himself regularly.  Chest x-ray without any pulmonary congestion.  Unable to check JVD due to  body habitus. Received IV Lasix which resulted in worsening of creatinine. -Holding more IV diuresis -Cardiology consult at his request  Leukocytosis Worsening leukocytosis.  Procalcitonin  at 0.36>>2.02. Chest x-ray with questionable infiltrate/atelectasis. -Started him on ceftriaxone and Zithromax for CAP coverage, he became febrile with worsening leukocytosis, ceftriaxone is being switched to cefepime -Monitor CBC  Chest pain Patient has atypical bandlike chest spasms, increased with any body movement and deep breathing.  Mildly positive troponin likely secondary to demand. -Echocardiogram ordered-pending  Essential hypertension Blood pressure within goal. -Continue home carvedilol, Cardura, Imdur.  Paroxysmal atrial fibrillation (HCC) Not on any anticoagulation due to history of GI bleed. -Continue amiodarone and aspirin  Chronic kidney disease, stage 4 (severe) (HCC) Likely AKI with history of CKD stage IV.  Creatinine increased to 3.27 today, patient did received IV Lasix.  Baseline appears to be around 2.5. -Holding IV Lasix today -Gentle IV fluid -Monitor renal function -Avoid nephrotoxins -Nephrology consult  Chronic obstructive pulmonary disease (HCC) No wheezing.  Slightly increased cough from his baseline. -Continue home bronchodilators  Type 2 diabetes mellitus with chronic kidney disease, with long-term current use of insulin (HCC) Uncontrolled with hyperglycemia and A1c of 11.3.  CBG improved today  Patient is on significant amount of insulin at home. -Continue Semglee to 35 units twice daily instead of 60 daily. -Decreasing mealtime coverage to 5 unit -Continue SSI  Obesity, Class III, BMI 40-49.9 (morbid obesity) (HCC) Estimated body mass index is 48.66 kg/m as calculated from the following:   Height as of this encounter: 5\' 8"  (1.727 m).   Weight as of this encounter: 145.2 kg.   This will complicate overall prognosis Encouraged weight loss  Obstructive sleep apnea of adult - BiPAP at night, patient is mostly noncompliant at home   Subjective: Patient was little somnolent when seen today.  Per girlfriend he is feeling little improved  and wants to get some sleep.  Had a pretty rough night with fever and shortness of breath.  Physical Exam: Vitals:   04/14/23 0519 04/14/23 1018 04/14/23 1146 04/14/23 1422  BP: 125/65 (!) 116/52 (!) 100/40 132/64  Pulse: 86 81 74 80  Resp:  18 (!) 23 20  Temp: 98.9 F (37.2 C) 98.1 F (36.7 C) 98 F (36.7 C) 98.6 F (37 C)  TempSrc: Oral  Oral   SpO2: 94% 98% 99% 95%  Weight:      Height:       General.  Morbidly obese gentleman, in no acute distress. Pulmonary.  Lungs clear bilaterally, normal respiratory effort. CV.  Regular rate and rhythm, no JVD, rub or murmur. Abdomen.  Soft, nontender, nondistended, BS positive. CNS.  Alert and oriented .  No focal neurologic deficit. Extremities.  Trace LE edema, pulses intact and symmetrical, signs of chronic venous stasis Psychiatry.  Judgment and insight appears normal.   Data Reviewed: Prior data reviewed  Family Communication: Discussed with girlfriend at bedside  Disposition: Status is: Inpatient Remains inpatient appropriate because: Severity of illness  Planned Discharge Destination: Home  DVT prophylaxis.  Lovenox Time spent: 50 minutes  This record has been created using Conservation officer, historic buildings. Errors have been sought and corrected,but may not always be located. Such creation errors do not reflect on the standard of care.   Author: Arnetha Courser, MD 04/14/2023 3:43 PM  For on call review [email or web address].

## 2023-04-14 NOTE — Progress Notes (Signed)
Called to patient's room by charge nurse 2' to increased work of breathing. Upon entry into room, noticed increased work of breathing with abdominal muscles. Confusion noted. PRN nebulizer given. Placed patient on BIPAP with 8L inline

## 2023-04-14 NOTE — Consult Note (Signed)
ANTICOAGULATION CONSULT NOTE  Pharmacy Consult for IV Heparin Indication:  PE rule-out  Patient Measurements: Height: 5\' 8"  (172.7 cm) Weight: (!) 145.2 kg (320 lb) IBW/kg (Calculated) : 68.4 Heparin Dosing Weight: 103.4 kg  Labs: Recent Labs    04/12/23 1305 04/12/23 1535 04/13/23 0148 04/13/23 1845 04/14/23 0515  HGB 12.5*  --  13.6  --  11.7*  HCT 38.0*  --  40.8  --  36.0*  PLT 196  --  226  --  187  CREATININE 3.13*  --  3.21*  --  3.27*  CKTOTAL  --  82  --   --   --   TROPONINIHS 20* 36* 43* 32*  --     Estimated Creatinine Clearance: 29.5 mL/min (A) (by C-G formula based on SCr of 3.27 mg/dL (H)).   Medical History: Past Medical History:  Diagnosis Date   CHF (congestive heart failure) (HCC)    COPD (chronic obstructive pulmonary disease) (HCC)    Diabetes mellitus without complication (HCC)    Edema    leg swelling   Gastroesophageal reflux disease 12/15/2017   Gout    Hypertension    Melena 11/05/2017   Pneumonia 07/27/2016    Medications:  No anticoagulation prior to admission per my chart review  Assessment: 72 y/o M with medical history including HFrEF, PAF not on anticoagulation due to history of severe GIB, CKD stage IV, COPD, IDDM, gout, hypothyroidism, BPH, morbid obesity admitted with shortness of breath.There is concern for PE. Pharmacy consulted to initiate and manage heparin infusion for PE rule-out.  Patient was on therapeutic Lovenox which is now being changed to IV heparin due to renal dysfunction and body habitus.  Last dose of therapeutic Lovenox was 8/23 at 0630. Will plan to start IV heparin 8/24 AM about ~ 24 hours from last dose of therapeutic Lovenox.  Will check a baseline anti-Xa level to monitor for Lovenox washout, aPTT, PT-INR. Check all of these tomorrow AM.  If VQ scan ordered and negative may not need to start IV heparin  Goal of Therapy:  Heparin level 0.3-0.7 units/ml Monitor platelets by anticoagulation protocol:  Yes   Plan:  --Tentatively plan to start IV heparin 8/24 AM at 0800, will need to assess baseline labs and especially anti-Xa level to ensure Lovenox washout before starting. If VQ scan is ordered and results negative, will need to discuss with MD if IV heparin is still indicated --When ready to initiate heparin, will plan for heparin 6000 unit IV bolus followed by continuous infusion at 1600 units/hr. Subject to change depending on discretion of covering pharmacist 8/24 AM. Will hold off on orders for now pending additional data as above for appropriateness of start --CBC daily while on IV heparin  Tressie Ellis 04/14/2023,9:35 AM

## 2023-04-14 NOTE — Inpatient Diabetes Management (Signed)
Inpatient Diabetes Program Recommendations  AACE/ADA: New Consensus Statement on Inpatient Glycemic Control (2015)  Target Ranges:  Prepandial:   less than 140 mg/dL      Peak postprandial:   less than 180 mg/dL (1-2 hours)      Critically ill patients:  140 - 180 mg/dL   Lab Results  Component Value Date   GLUCAP 181 (H) 04/14/2023   HGBA1C 11.3 (H) 04/13/2023    Latest Reference Range & Units 04/13/23 07:26 04/13/23 12:16 04/13/23 16:53 04/13/23 21:17 04/14/23 09:19 04/14/23 11:49  Glucose-Capillary 70 - 99 mg/dL 562 (H) 130 (H) 865 (H) 172 (H) 156 (H) 181 (H)  (H): Data is abnormally high  Latest Reference Range & Units 04/14/23 05:15  Sodium 135 - 145 mmol/L 136  Potassium 3.5 - 5.1 mmol/L 4.2  Chloride 98 - 111 mmol/L 89 (L)  CO2 22 - 32 mmol/L 30  Glucose 70 - 99 mg/dL 784 (H)  BUN 8 - 23 mg/dL 57 (H)  Creatinine 6.96 - 1.24 mg/dL 2.95 (H)  Calcium 8.9 - 10.3 mg/dL 28.4 (H)  Anion gap 5 - 15  17 (H)  (L): Data is abnormally low (H): Data is abnormally high  Diabetes history: DM2 Outpatient Diabetes medications: Tresiba 120 units ac breakfast am, Glucotrol 5 mg qam Current orders for Inpatient glycemic control: Semglee 60 q hs, Novolog 20 units tid meal coverage, Novolog 0-20 units tid, Novolog 0-5 units hs correction,  Tradjenta 5 mg qam  Inpatient Diabetes Program Recommendations:   Patient has not received Novolog meal coverage for breakfast or lunch today and CBG @ noon was 181. Please consider: -Decrease Novolog correction to 0-9 units tid, 0-5 units hs -Decrease Novolog meal coverage to 4 units tid if eats 50% meals  Thank you, Billy Fischer. Ladeidra Borys, RN, MSN, CDE  Diabetes Coordinator Inpatient Glycemic Control Team Team Pager [phone] (8am-5pm) 04/14/2023 1:13 PM

## 2023-04-14 NOTE — Care Management Important Message (Signed)
Important Message  Patient Details  Name: Jason Hudson MRN: [id number] Date of Birth: 15-Nov-1950   Medicare Important Message Given:  Yes     [name] 04/14/2023, 11:31 AM

## 2023-04-14 NOTE — Discharge Instructions (Signed)
Heart Healthy, Consistent Carbohydrate Nutrition Therapy   A heart-healthy and consistent carbohydrate diet is recommended to manage heart disease and diabetes. To follow a heart-healthy and consistent carbohydrate diet, Eat a balanced diet with whole grains, fruits and vegetables, and lean protein sources.  Choose heart-healthy unsaturated fats. Limit saturated fats, trans fats, and cholesterol intake. Eat more plant-based or vegetarian meals using beans and soy foods for protein.  Eat whole, unprocessed foods to limit the amount of sodium (salt) you eat.  Choose a consistent amount of carbohydrate at each meal and snack. Limit refined carbohydrates especially sugar, sweets and sugar-sweetened beverages.  If you drink alcohol, do so in moderation: one serving per day (women) and two servings per day (men). o One serving is equivalent to 12 ounces beer, 5 ounces wine, or 1.5 ounces distilled spirits  Tips Tips for Choosing Heart-Healthy Fats Choose lean protein and low-fat dairy foods to reduce saturated fat intake. Saturated fat is usually found in animal-based protein and is associated with certain health risks. Saturated fat is the biggest contributor to raise low-density lipoprotein (LDL) cholesterol levels. Research shows that limiting saturated fat lowers unhealthy cholesterol levels. Eat no more than 7% of your total calories each day from saturated fat. Ask your RDN to help you determine how much saturated fat is right for you. There are many foods that do not contain large amounts of saturated fats. Swapping these foods to replace foods high in saturated fats will help you limit the saturated fat you eat and improve your cholesterol levels. You can also try eating more plant-based or vegetarian meals. Instead of. Try:  Whole milk, cheese, yogurt, and ice cream 1% or skim milk, low-fat cheese, non-fat yogurt, and low-fat ice cream  Fatty, marbled beef and pork Lean beef, pork, or venison   Poultry with skin Poultry without skin  Butter, stick margarine Reduced-fat, whipped, or liquid spreads  Coconut oil, palm oil Liquid vegetable oils: corn, canola, olive, soybean and safflower oils   Avoid foods that contain trans fats. Trans fats increase levels of LDL-cholesterol. Hydrogenated fat in processed foods is the main source of trans fats in foods.  Trans fats can be found in stick margarine, shortening, processed sweets, baked goods, some fried foods, and packaged foods made with hydrogenated oils. Avoid foods with "partially hydrogenated oil" on the ingredient list such as: cookies, pastries, baked goods, biscuits, crackers, microwave popcorn, and frozen dinners. Choose foods with heart healthy fats. Polyunsaturated and monounsaturated fat are unsaturated fats that may help lower your blood cholesterol level when used in place of saturated fat in your diet. Ask your RDN about taking a dietary supplement with plant sterols and stanols to help lower your cholesterol level. Research shows that substituting saturated fats with unsaturated fats is beneficial to cholesterol levels. Try these easy swaps: Instead of. Try:  Butter, stick margarine, or solid shortening Reduced-fat, whipped, or liquid spreads  Beef, pork, or poultry with skin Fish and seafood  Chips, crackers, snack foods Raw or unsalted nuts and seeds or nut butters Hummus with vegetables Avocado on toast  Coconut oil, palm oil Liquid vegetable oils: corn, canola, olive, soybean and safflower oils  Limit the amount of cholesterol you eat to less than 200 milligrams per day. Cholesterol is a substance carried through the bloodstream via lipoproteins, which are known as "transporters" of fat. Some body functions need cholesterol to work properly, but too much cholesterol in the bloodstream can damage arteries and build up blood vessel linings (  which can lead to heart attack and stroke). You should eat less than 200 milligrams  cholesterol per day. People respond differently to eating cholesterol. There is no test available right now that can figure out which people will respond more to dietary cholesterol and which will respond less. For individuals with high intake of dietary cholesterol, different types of increase (none, small, moderate, large) in LDL-cholesterol levels are all possible.  Food sources of cholesterol include egg yolks and organ meats such as liver, gizzards. Limit egg yolks to two to four per week and avoid organ meats like liver and gizzards to control cholesterol intake. Tips for Choosing Heart-Healthy Carbohydrates Consume a consistent amount of carbohydrate It is important to eat foods with carbohydrates in moderation because they impact your blood glucose level. Carbohydrates can be found in many foods such as: Grains (breads, crackers, rice, pasta, and cereals)  Starchy Vegetables (potatoes, corn, and peas)  Beans and legumes  Milk, soy milk, and yogurt  Fruit and fruit juice  Sweets (cakes, cookies, ice cream, jam and jelly) Your RDN will help you set a goal for how many carbohydrate servings to eat at your meals and snacks. For many adults, eating 3 to 5 servings of carbohydrate foods at each meal and 1 or 2 carbohydrate servings for each snack works well.  Check your blood glucose level regularly. It can tell you if you need to adjust when you eat carbohydrates. Choose foods rich in viscous (soluble) fiber Viscous, or soluble, is found in the walls of plant cells. Viscous fiber is found only in plant-based foods. Eating foods with fiber helps to lower your unhealthy cholesterol and keep your blood glucose in range  Rich sources of viscous fiber include vegetables (asparagus, Brussels sprouts, sweet potatoes, turnips) fruit (apricots, mangoes, oranges), legumes, and whole grains (barley, oats, and oat bran).  As you increase your fiber intake gradually, also increase the amount of water you  drink. This will help prevent constipation.  If you have difficulty achieving this goal, ask your RDN about fiber laxatives. Choose fiber supplements made with viscous fibers such as psyllium seed husks or methylcellulose to help lower unhealthy cholesterol.  Limit refined carbohydrates  There are three types of carbohydrates: starches, sugar, and fiber. Some carbohydrates occur naturally in food, like the starches in rice or corn or the sugars in fruits and milk. Refined carbohydrates--foods with high amounts of simple sugars--can raise triglyceride levels. High triglyceride levels are associated with coronary heart disease. Some examples of refined carbohydrate foods are table sugar, sweets, and beverages sweetened with added sugar. Tips for Reducing Sodium (Salt) Although sodium is important for your body to function, too much sodium can be harmful for people with high blood pressure. As sodium and fluid buildup in your tissues and bloodstream, your blood pressure increases. High blood pressure may cause damage to other organs and increase your risk for a stroke. Even if you take a pill for blood pressure or a water pill (diuretic) to remove fluid, it is still important to have less salt in your diet. Ask your doctor and RDN what amount of sodium is right for you. Avoid processed foods. Eat more fresh foods.  Fresh fruits and vegetables are naturally low in sodium, as well as frozen vegetables and fruits that have no added juices or sauces.  Fresh meats are lower in sodium than processed meats, such as bacon, sausage, and hotdogs. Read the nutrition label or ask your butcher to help you find a  fresh meat that is low in sodium. Eat less salt--at the table and when cooking.  A single teaspoon of table salt has 2,300 mg of sodium.  Leave the salt out of recipes for pasta, casseroles, and soups.  Ask your RDN how to cook your favorite recipes without sodium Be a smart shopper.  Look for food packages  that say "salt-free" or "sodium-free." These items contain less than 5 milligrams of sodium per serving.  "Very low-sodium" products contain less than 35 milligrams of sodium per serving.  "Low-sodium" products contain less than 140 milligrams of sodium per serving.  Beware for "Unsalted" or "No Added Salt" products. These items may still be high in sodium. Check the nutrition label. Add flavors to your food without adding sodium.  Try lemon juice, lime juice, fruit juice or vinegar.  Dry or fresh herbs add flavor. Try basil, bay leaf, dill, rosemary, parsley, sage, dry mustard, nutmeg, thyme, and paprika.  Pepper, red pepper flakes, and cayenne pepper can add spice t your meals without adding sodium. Hot sauce contains sodium, but if you use just a drop or two, it will not add up to much.  Buy a sodium-free seasoning blend or make your own at home. Additional Lifestyle Tips Achieve and maintain a healthy weight. Talk with your RDN or your doctor about what is a healthy weight for you. Set goals to reach and maintain that weight.  To lose weight, reduce your calorie intake along with increasing your physical activity. A weight loss of 10 to 15 pounds could reduce LDL-cholesterol by 5 milligrams per deciliter. Participate in physical activity. Talk with your health care team to find out what types of physical activity are best for you. Set a plan to get about 30 minutes of exercise on most days.  Foods Recommended Food Group Foods Recommended  Grains Whole grain breads and cereals, including whole wheat, barley, rye, buckwheat, corn, teff, quinoa, millet, amaranth, brown or wild rice, sorghum, and oats Pasta, especially whole wheat or other whole grain types  [employer], quinoa or wild rice Whole grain crackers, bread, rolls, pitas Home-made bread with reduced-sodium baking soda  Protein Foods Lean cuts of beef and pork (loin, leg, round, extra lean hamburger)  Skinless Press photographer  and other wild game Dried beans and peas Nuts and nut butters Meat alternatives made with soy or textured vegetable protein  Egg whites or egg substitute Cold cuts made with lean meat or soy protein  Dairy Nonfat (skim), low-fat, or 1%-fat milk  Nonfat or low-fat yogurt or cottage cheese Fat-free and low-fat cheese  Vegetables Fresh, frozen, or canned vegetables without added fat or salt   Fruits Fresh, frozen, canned, or dried fruit   Oils Unsaturated oils (corn, olive, peanut, soy, sunflower, canola)  Soft or liquid margarines and vegetable oil spreads  Salad dressings Seeds and nuts  Avocado   Foods Not Recommended Food Group Foods Not Recommended  Grains Breads or crackers topped with salt Cereals (hot or cold) with more than 300 mg sodium per serving Biscuits, cornbread, and other "quick" breads prepared with baking soda Bread crumbs or stuffing mix from a store High-fat bakery products, such as doughnuts, biscuits, croissants, danish pastries, pies, cookies Instant cooking foods to which you add hot water and stir--potatoes, noodles, rice, etc. Packaged starchy foods--seasoned noodle or rice dishes, stuffing mix, macaroni and cheese dinner Snacks made with partially hydrogenated oils, including chips, cheese puffs, snack mixes, regular crackers, butter-flavored popcorn  Protein Foods  Higher-fat cuts of meats (ribs, t-bone steak, regular hamburger) Bacon, sausage, or hot dogs Cold cuts, such as salami or bologna, deli meats, cured meats, corned beef Organ meats (liver, brains, gizzards, sweetbreads) Poultry with skin Fried or smoked meat, poultry, and fish Whole eggs and egg yolks (more than 2-4 per week) Salted legumes, nuts, seeds, or nut/seed butters Meat alternatives with high levels of sodium (>300 mg per serving) or saturated fat (>5 g per serving)  Dairy Whole milk,?2% fat milk, buttermilk Whole milk yogurt or ice cream Cream Half-&-half Cream cheese Sour  cream Cheese  Vegetables Canned or frozen vegetables with salt, fresh vegetables prepared with salt, butter, cheese, or cream sauce Fried vegetables Pickled vegetables such as olives, pickles, or sauerkraut  Fruits Fried fruits Fruits served with butter or cream  Oils Butter, stick margarine, shortening Partially hydrogenated oils or trans fats Tropical oils (coconut, palm, palm kernel oils)  Other Candy, sugar sweetened soft drinks and desserts Salt, sea salt, garlic salt, and seasoning mixes containing salt Bouillon cubes Ketchup, barbecue sauce, Worcestershire sauce, soy sauce, teriyaki sauce Miso Salsa Pickles, olives, relish   Heart Healthy Consistent Carbohydrate Vegetarian (Lacto-Ovo) Sample 1-Day Menu  Breakfast 1 cup oatmeal, cooked (2 carbohydrate servings)   cup blueberries (1 carbohydrate serving)  11 almonds, without salt  1 cup 1% milk (1 carbohydrate serving)  1 cup coffee  Morning Snack 1 cup fat-free plain yogurt (1 carbohydrate serving)  Lunch 1 whole wheat bun (1 carbohydrate servings)  1 black bean burger (1 carbohydrate servings)  1 slice cheddar cheese, low sodium  2 slices tomatoes  2 leaves lettuce  1 teaspoon mustard  1 small pear (1 carbohydrate servings)  1 cup green tea, unsweetened  Afternoon Snack 1/3 cup trail mix with nuts, seeds, and raisins, without salt (1 carbohydrate servinga)  Evening Meal  cup meatless chicken  2/3 cup brown rice, cooked (2 carbohydrate servings)  1 cup broccoli, cooked (2/3 carbohydrate serving)   cup carrots, cooked (1/3 carbohydrate serving)  2 teaspoons olive oil  1 teaspoon balsamic vinegar  1 whole wheat dinner roll (1 carbohydrate serving)  1 teaspoon margarine, soft, tub  1 cup 1% milk (1 carbohydrate serving)  Evening Snack 1 extra small banana (1 carbohydrate serving)  1 tablespoon peanut butter   Heart Healthy Consistent Carbohydrate Vegan Sample 1-Day Menu  Breakfast 1 cup oatmeal, cooked (2  carbohydrate servings)   cup blueberries (1 carbohydrate serving)  11 almonds, without salt  1 cup soymilk fortified with calcium, vitamin B12, and vitamin D  1 cup coffee  Morning Snack 6 ounces soy yogurt (1 carbohydrate servings)  Lunch 1 whole wheat bun(1 carbohydrate servings)  1 black bean burger (1 carbohydrate serving)  2 slices tomatoes  2 leaves lettuce  1 teaspoon mustard  1 small pear (1 carbohydrate servings)  1 cup green tea, unsweetened  Afternoon Snack 1/3 cup trail mix with nuts, seeds, and raisins, without salt (1 carbohydrate servings)  Evening Meal  cup meatless chicken  2/3 cup brown rice, cooked (2 carbohydrate servings)  1 cup broccoli, cooked (2/3 carbohydrate serving)   cup carrots, cooked (1/3 carbohydrate serving)  2 teaspoons olive oil  1 teaspoon balsamic vinegar  1 whole wheat dinner roll (1 carbohydrate serving)  1 teaspoon margarine, soft, tub  1 cup soymilk fortified with calcium, vitamin B12, and vitamin D  Evening Snack 1 extra small banana (1 carbohydrate serving)  1 tablespoon peanut butter    Heart Healthy Consistent Carbohydrate Sample  1-Day Menu  Breakfast 1 cup cooked oatmeal (2 carbohydrate servings)  3/4 cup blueberries (1 carbohydrate serving)  1 ounce almonds  1 cup skim milk (1 carbohydrate serving)  1 cup coffee  Morning Snack 1 cup sugar-free nonfat yogurt (1 carbohydrate serving)  Lunch 2 slices whole-wheat bread (2 carbohydrate servings)  2 ounces lean [place] breast  1 ounce low-fat Swiss cheese  1 teaspoon mustard  1 slice tomato  1 lettuce leaf  1 small pear (1 carbohydrate serving)  1 cup skim milk (1 carbohydrate serving)  Afternoon Snack 1 ounce trail mix with unsalted nuts, seeds, and raisins (1 carbohydrate serving)  Evening Meal 3 ounces salmon  2/3 cup cooked brown rice (2 carbohydrate servings)  1 teaspoon soft margarine  1 cup cooked broccoli with 1/2 cup cooked carrots (1 carbohydrate serving  Carrots,  cooked, boiled, drained, without salt  1 cup lettuce  1 teaspoon olive oil with vinegar for dressing  1 small whole grain roll (1 carbohydrate serving)  1 teaspoon soft margarine  1 cup unsweetened tea  Evening Snack 1 extra-small banana (1 carbohydrate serving)  Copyright 2020  [employer]. All rights reserved.   Medication Assistance -Patient assistance has been initiated for Jardiance through [facility]. In ~2 weeks, you may call [phone] to see the status of your application. If approved, [facility] will mail Jardiance to your address for no cost. If you run into difficulty with approval, please contact your nephrology office. If approved, you will need to call [facility] when refills are needed.

## 2023-04-14 NOTE — TOC Initial Note (Addendum)
Transition of Care [facility]) - Initial/Assessment Note    Patient Details  Name: Jason Hudson MRN: [id number] Date of Birth: October 15, 1950  [facility] CM/SW Contact:    Truddie Hidden, RN Phone Number: 04/14/2023, 10:41 AM  Clinical Narrative:                 Admitted for: CHF, COPD Admitted from: Home with his significant other  [facility]: [facility] [place] (no difficulty obtaining medications) Current home health/prior home health/DME: Oxygen and CPAP via [employer] Transportation: SO will transport home and to appointments   If [facility] is recommended they do not have a preference.    Expected Discharge Plan: Home/Self Care Barriers to Discharge: Continued Medical Work up   Patient Goals and CMS Choice Patient states their goals for this hospitalization and ongoing recovery are:: Return home          Expected Discharge Plan and Services       Living arrangements for the past 2 months: Single Family Home                                      Prior Living Arrangements/Services Living arrangements for the past 2 months: Single Family Home Lives with:: Significant Other   Do you feel safe going back to the place where you live?: Yes      Need for Family Participation in Patient Care: Yes (Comment)     Criminal Activity/Legal Involvement Pertinent to Current Situation/Hospitalization: No - Comment as needed  Activities of Daily Living Home Assistive Devices/Equipment: Nebulizer, CPAP, Oxygen ADL Screening (condition at time of admission) Patient's cognitive ability adequate to safely complete daily activities?: Yes Is the patient deaf or have difficulty hearing?: No Does the patient have difficulty seeing, even when wearing glasses/contacts?: No Does the patient have difficulty concentrating, remembering, or making decisions?: No Patient able to express need for assistance with ADLs?: Yes Does the patient have difficulty dressing or bathing?:  No Independently performs ADLs?: Yes (appropriate for developmental age) Does the patient have difficulty walking or climbing stairs?: Yes Weakness of Legs: Both Weakness of Arms/Hands: None  Permission Sought/Granted                  Emotional Assessment Appearance:: Other (Comment Required (AOx1, Completed by SO) Attitude/Demeanor/Rapport: Gracious, Engaged Affect (typically observed): Accepting   Alcohol / Substance Use: Not Applicable Psych Involvement: No (comment)  Admission diagnosis:  Shortness of breath [R06.02] CHF (congestive heart failure) (HCC) [I50.9] Edema, unspecified type [R60.9] Chest pain, unspecified type [R07.9] Patient Active Problem List   Diagnosis Date Noted   Shortness of breath 04/13/2023   Leukocytosis 04/13/2023   Chest pain 04/13/2023   Paroxysmal atrial fibrillation (HCC) 04/13/2023   Obesity, Class III, BMI 40-49.9 (morbid obesity) (HCC) 04/13/2023   CHF (congestive heart failure) (HCC) 04/12/2023   Gastric ulcer 05/18/2021   Benign hypertensive kidney disease with chronic kidney disease 02/15/2021   Proteinuria 02/15/2021   Secondary hyperparathyroidism of renal origin (HCC) 02/15/2021   Chronic kidney disease, stage 4 (severe) (HCC) 10/28/2020   Hypertension 10/28/2020   Low back pain due to bilateral sciatica 02/19/2020   Lumbar radiculopathy 02/18/2020   Encounter for general adult medical examination with abnormal findings 08/31/2019   Flu vaccine need 08/31/2019   Need for vaccination against Streptococcus pneumoniae using pneumococcal conjugate vaccine 13 08/31/2019   Dysuria 08/31/2019   Irritable bowel  syndrome with constipation 05/29/2019   Chronic kidney disease due to type 2 diabetes mellitus (HCC) 05/22/2019   Type 2 diabetes mellitus with hyperglycemia (HCC) 03/17/2019   Clinically significant macular edema 03/01/2019   Lower extremity edema 12/08/2018   Edema of lower extremity 12/08/2018   Diabetic ulcer of calf (HCC)  11/10/2018   Restrictive lung disease 10/26/2018   Acute exacerbation of CHF (congestive heart failure) (HCC) 09/12/2018   Acute on chronic systolic congestive heart failure (HCC) 09/06/2018   AKI (acute kidney injury) (HCC) 09/06/2018   Acute kidney failure, unspecified (HCC) 09/06/2018   Moderate intermittent asthma without complication 08/30/2018   Coronary arteriosclerosis 08/30/2018   Diastolic dysfunction 08/30/2018   Hypercholesterolemia 08/30/2018   Obesity 08/30/2018   COPD exacerbation (HCC) 08/24/2018   Hyperlipidemia due to type 2 diabetes mellitus (HCC) 03/28/2018   Type 2 diabetes mellitus with chronic kidney disease, with long-term current use of insulin (HCC) 03/28/2018   Type 2 diabetes mellitus with stage 4 chronic kidney disease, with long-term current use of insulin (HCC) 03/28/2018   Type 2 diabetes mellitus (HCC) 03/28/2018   Essential hypertension 01/19/2018   Chronic obstructive lung disease (HCC) 12/15/2017   Gastroesophageal reflux disease 12/15/2017   Obstructive sleep apnea of adult 12/15/2017   Chronic obstructive pulmonary disease (HCC) 12/15/2017   Melena 11/05/2017   Congestive heart failure (HCC) 10/20/2017   Heart failure, unspecified (HCC) 10/20/2017   Pneumonia 07/27/2016   PCP:  Enid Baas, MD Pharmacy:   [facility] - [place], [place] - [address] [place] [address] Phone: [phone] Fax: [phone]  [facility] - [place], [place] - [address]ene[address]er [place] [address] Phone: [phone] Fax: [phone]     Social Determinants of Health (SDOH) Social History: SDOH Screenings   Food Insecurity: No Food Insecurity (04/13/2023)  Housing: Low Risk  (04/13/2023)  Transportation Needs: No Transportation Needs (04/13/2023)  Utilities: Not At Risk (04/13/2023)  Alcohol Screen: Low Risk  (05/11/2022)  Depression (PHQ2-9): Low Risk  (05/11/2022)  Tobacco Use: Low Risk   (04/13/2023)   SDOH Interventions:     Readmission Risk Interventions    04/14/2023   10:31 AM  Readmission Risk Prevention Plan  Transportation Screening Complete  PCP or Specialist Appt within 3-5 Days Complete  HRI or Home Care Consult Complete  Social Work Consult for Recovery Care Planning/Counseling Complete  Palliative Care Screening Not Complete  Medication Review Oceanographer) Complete

## 2023-04-14 NOTE — Progress Notes (Signed)
Placed patient on V60 to get more accurate numbers per MD's request.

## 2023-04-15 DIAGNOSIS — J189 Pneumonia, unspecified organism: Secondary | ICD-10-CM | POA: Diagnosis not present

## 2023-04-15 DIAGNOSIS — I5023 Acute on chronic systolic (congestive) heart failure: Secondary | ICD-10-CM | POA: Diagnosis not present

## 2023-04-15 DIAGNOSIS — D72829 Elevated white blood cell count, unspecified: Secondary | ICD-10-CM | POA: Diagnosis not present

## 2023-04-15 DIAGNOSIS — R079 Chest pain, unspecified: Secondary | ICD-10-CM | POA: Diagnosis not present

## 2023-04-15 DIAGNOSIS — A419 Sepsis, unspecified organism: Secondary | ICD-10-CM

## 2023-04-15 LAB — BASIC METABOLIC PANEL
Anion gap: 9 (ref 5–15)
BUN: 56 mg/dL — ABNORMAL HIGH (ref 8–23)
CO2: 31 mmol/L (ref 22–32)
Calcium: 9.9 mg/dL (ref 8.9–10.3)
Chloride: 93 mmol/L — ABNORMAL LOW (ref 98–111)
Creatinine, Ser: 3.12 mg/dL — ABNORMAL HIGH (ref 0.61–1.24)
GFR, Estimated: 21 mL/min — ABNORMAL LOW (ref >60)
Glucose, Bld: 70 mg/dL (ref 70–99)
Potassium: 3.9 mmol/L (ref 3.5–5.1)
Sodium: 133 mmol/L — ABNORMAL LOW (ref 135–145)

## 2023-04-15 LAB — CBC
HCT: 33.6 % — ABNORMAL LOW (ref 39.0–52.0)
Hemoglobin: 11.3 g/dL — ABNORMAL LOW (ref 13.0–17.0)
MCH: 32 pg (ref 26.0–34.0)
MCHC: 33.6 g/dL (ref 30.0–36.0)
MCV: 95.2 fL (ref 80.0–100.0)
Platelets: 169 10*3/uL (ref 150–400)
RBC: 3.53 MIL/uL — ABNORMAL LOW (ref 4.22–5.81)
RDW: 13.9 % (ref 11.5–15.5)
WBC: 17.6 10*3/uL — ABNORMAL HIGH (ref 4.0–10.5)
nRBC: 0 % (ref 0.0–0.2)

## 2023-04-15 LAB — GLUCOSE, CAPILLARY
Glucose-Capillary: 78 mg/dL (ref 70–99)
Glucose-Capillary: 125 mg/dL — ABNORMAL HIGH (ref 70–99)
Glucose-Capillary: 89 mg/dL (ref 70–99)
Glucose-Capillary: 121 mg/dL — ABNORMAL HIGH (ref 70–99)

## 2023-04-15 LAB — PROTIME-INR
INR: 1.1 (ref 0.8–1.2)
Prothrombin Time: 14.6 s (ref 11.4–15.2)

## 2023-04-15 LAB — HEPARIN ANTI-XA: Heparin LMW: 0.21 [IU]/mL

## 2023-04-15 LAB — APTT: aPTT: 43 s — ABNORMAL HIGH (ref 24–36)

## 2023-04-15 MED ORDER — SODIUM CHLORIDE 0.9 % IV SOLN
2.0000 g | Freq: Two times a day (BID) | INTRAVENOUS | Status: DC
  Administered 2023-04-15 – 2023-04-16 (×4): 2 g via INTRAVENOUS
  Filled 2023-04-15 (×5): qty 12.5

## 2023-04-15 MED ORDER — ENOXAPARIN SODIUM 80 MG/0.8ML IJ SOSY
0.5000 mg/kg | PREFILLED_SYRINGE | INTRAMUSCULAR | Status: DC
  Administered 2023-04-15 – 2023-04-19 (×5): 75 mg via SUBCUTANEOUS
  Filled 2023-04-15: qty 0.8
  Filled 2023-04-15 (×3): qty 0.75
  Filled 2023-04-15: qty 0.8
  Filled 2023-04-15: qty 0.75

## 2023-04-15 MED ORDER — TORSEMIDE 20 MG PO TABS
40.0000 mg | ORAL_TABLET | Freq: Two times a day (BID) | ORAL | Status: DC
  Administered 2023-04-15 – 2023-04-17 (×5): 40 mg via ORAL
  Filled 2023-04-15 (×5): qty 2

## 2023-04-15 NOTE — Consult Note (Signed)
Pharmacy Antibiotic Note  Macallister Arian is a 72 y.o. male with medical history including HFrEF, PAF, CKD stage IV, COPD, IDDM, gout, hypothyroidism, BPH, morbid obesity admitted on 04/12/2023 with pneumonia.  Pharmacy has been consulted for cefepime dosing.   Plan: Scr improved 3.27 >> 3.12 Adjust Cefepime to 2 g IV q12h  for Crcl 31.2 ml/min  F/u renal fxn, cultures  Height: 5\' 8"  (172.7 cm) Weight: (!) 147.8 kg (325 lb 13.4 oz) IBW/kg (Calculated) : 68.4  Temp (24hrs), Avg:98.3 F (36.8 C), Min:97.9 F (36.6 C), Max:98.6 F (37 C)  Recent Labs  Lab 04/12/23 1305 04/13/23 0148 04/14/23 0515 04/14/23 0925 04/14/23 1211 04/15/23 0433  WBC 16.3* 20.6* 31.1*  --   --  17.6*  CREATININE 3.13* 3.21* 3.27*  --   --  3.12*  LATICACIDVEN  --   --   --  1.9 1.4  --     Estimated Creatinine Clearance: 31.2 mL/min (A) (by C-G formula based on SCr of 3.12 mg/dL (H)).    Allergies  Allergen Reactions   Red Dye #40 (Allura Red) Anaphylaxis and Other (See Comments)    "not as allergic to it as I used to be. I can take a little of it" "not as allergic to it as I used to be. I can take a little of it" "not as allergic to it as I used to be. I can take a little of it" Other reaction(s): Not available "not as allergic to it as I used to be. I can take a little of it" "not as allergic to it as I used to be. I can take a little of it" "not as allergic to it as I used to be. I can take a little of it" "not as allergic to it as I used to be. I can take a little of it" "not as allergic to it as I used to be. I can take a little of it" "not as allergic to it as I used to be. I can take a little of it" "not as allergic to it as I used to be. I can take a little of it"    Atorvastatin Other (See Comments)    Other reaction(s): Not available Other reaction(s): Other (See Comments)    Atorvastatin Calcium     Antimicrobials this admission: Ceftriaxone 8/22 x 1 Azithromycin 8/22  >>  Cefepime 8/23 >>   Dose adjustments this admission: N/A  Microbiology results: 8/23 BCx: NG,24 hrs 8/22 UCx:  8/23 Sputum: pending  8/23 MRSA PCR: (-) 8/23 RVP: (-)  Thank you for allowing pharmacy to be a part of this patient's care.  Bari Mantis PharmD Clinical Pharmacist 04/15/2023

## 2023-04-15 NOTE — Progress Notes (Signed)
CONSULT NOTE  CONCERNING:  Enoxaparin (Lovenox) for DVT Prophylaxis    RECOMMENDATION: Filed Weights   04/13/23 0153 04/15/23 0500  Weight: (!) 145.2 kg (320 lb) (!) 147.8 kg (325 lb 13.4 oz)    Body mass index is 49.54 kg/m.  Estimated Creatinine Clearance: 31.2 mL/min (A) (by C-G formula based on SCr of 3.12 mg/dL (H)).   Based on [facility] policy patient is candidate for enoxaparin 0.5mg /kg TBW SQ every 24 hours based on BMI being >30.   DESCRIPTION: Pharmacy has entered enoxaparin dose per [facility] policy.  Patient is now receiving enoxaparin 75 mg every 24 hours    Gardner Candle, PharmD, BCPS Clinical Pharmacist 04/15/2023 9:10 AM

## 2023-04-15 NOTE — Assessment & Plan Note (Signed)
Improved Patient has atypical bandlike chest spasms, increased with any body movement and deep breathing.  Mildly positive troponin likely secondary to demand. -Echocardiogram with improved EF and no wall motion abnormalities

## 2023-04-15 NOTE — Progress Notes (Addendum)
[facility]  ROUNDING NOTE   Subjective:   Jason [place] is a 72 y.o. male with past medical history of chronic systolic heart failure, atrial fibrillation on anticoagulation and chronic kidney disease stage IV.  Patient presents to the emergency department complaining of shortness of breath with chest pain and lower extremity edema.  Patient has been admitted for Shortness of breath [R06.02] CHF (congestive heart failure) (HCC) [I50.9] Edema, unspecified type [R60.9] Chest pain, unspecified type [R07.9]  Patient is known to our practice and is followed outpatient by Dr. Wynelle Link.  Patient was last seen in office on April 04, 2023 for routine follow-up.   Patient sitting up in chair No family present Alert and oriented Remains on 7L HFNC Appears to have returned to baseline lower extremity edema Increased oxygen requirement remains  Creatinine 3.12 Urine output 2.2L in past 24 hours  Objective:  Vital signs in last 24 hours:  Temp:  [97.9 F (36.6 C)-98.6 F (37 C)] 98.6 F (37 C) (08/24 0741) Pulse Rate:  [74-87] 80 (08/24 0741) Resp:  [18-23] 18 (08/24 0741) BP: (100-146)/(40-75) 146/75 (08/24 0741) SpO2:  [95 %-99 %] 98 % (08/24 0741) FiO2 (%):  [45 %] 45 % (08/23 2216) Weight:  [147.8 kg] 147.8 kg (08/24 0500)  Weight change:  Filed Weights   04/13/23 0153 04/15/23 0500  Weight: (!) 145.2 kg (!) 147.8 kg    Intake/Output: I/O last 3 completed shifts: In: 2111.2 [P.O.:410; I.V.:1351.2; IV Piggyback:350] Out: 3750 [Urine:3750]   Intake/Output this shift:  No intake/output data recorded.  Physical Exam: General: NAD, sitting in chair  Head: Normocephalic, atraumatic. Moist oral mucosal membranes  Eyes: Anicteric  Neck: Supple  Lungs:  Basilar crackles, HFNC  Heart: Regular rate and rhythm  Abdomen:  Soft, nontender, obese  Extremities: 1-2+ peripheral edema.  Neurologic: Nonfocal, moving all four extremities  Skin: No lesions   Access: None    Basic Metabolic Panel: Recent Labs  Lab 04/12/23 1305 04/13/23 0148 04/14/23 0515 04/15/23 0433  NA 130* 133* 136 133*  K 4.7 4.3 4.2 3.9  CL 89* 89* 89* 93*  CO2 26 30 30 31   GLUCOSE 459* 284* 129* 70  BUN 44* 48* 57* 56*  CREATININE 3.13* 3.21* 3.27* 3.12*  CALCIUM 11.3* 12.0* 11.3* 9.9  MG  --   --  1.6*  --     Liver Function Tests: Recent Labs  Lab 04/13/23 0148  AST 31  ALT 25  ALKPHOS 81  BILITOT 0.9  PROT 8.3*  ALBUMIN 3.9   Recent Labs  Lab 04/13/23 0148  LIPASE 28   No results for input(s): "AMMONIA" in the last 168 hours.  CBC: Recent Labs  Lab 04/12/23 1305 04/13/23 0148 04/14/23 0515 04/15/23 0433  WBC 16.3* 20.6* 31.1* 17.6*  HGB 12.5* 13.6 11.7* 11.3*  HCT 38.0* 40.8 36.0* 33.6*  MCV 93.4 95.1 95.7 95.2  PLT 196 226 187 169    Cardiac Enzymes: Recent Labs  Lab 04/12/23 1535  CKTOTAL 82    BNP: Invalid input(s): "POCBNP"  CBG: Recent Labs  Lab 04/14/23 0919 04/14/23 1149 04/14/23 1736 04/14/23 2110 04/15/23 0739  GLUCAP 156* 181* 136* 100* 78    Microbiology: Results for orders placed or performed during the hospital encounter of 04/12/23  SARS Coronavirus 2 by RT PCR (hospital order, performed in [facility] hospital lab) *cepheid single result test* Anterior Nasal Swab     Status: None   Collection Time: 04/13/23 10:53 AM   Specimen:  Anterior Nasal Swab  Result Value Ref Range Status   SARS Coronavirus 2 by RT PCR NEGATIVE NEGATIVE Final    Comment: (NOTE) SARS-CoV-2 target nucleic acids are NOT DETECTED.  The SARS-CoV-2 RNA is generally detectable in upper and lower respiratory specimens during the acute phase of infection. The lowest concentration of SARS-CoV-2 viral copies this assay can detect is 250 copies / mL. A negative result does not preclude SARS-CoV-2 infection and should not be used as the sole basis for treatment or other patient management decisions.  A negative result may occur  with improper specimen collection / handling, submission of specimen other than nasopharyngeal swab, presence of viral mutation(s) within the areas targeted by this assay, and inadequate number of viral copies (<250 copies / mL). A negative result must be combined with clinical observations, patient history, and epidemiological information.  Fact Sheet for Patients:   RoadLapTop.co.za  Fact Sheet for Healthcare Providers: http://kim-miller.com/  This test is not yet approved or  cleared by the [place] FDA and has been authorized for detection and/or diagnosis of SARS-CoV-2 by FDA under an Emergency Use Authorization (EUA).  This EUA will remain in effect (meaning this test can be used) for the duration of the COVID-19 declaration under Section 564(b)(1) of the Act, 21 U.S.C. section 360bbb-3(b)(1), unless the authorization is terminated or revoked sooner.  Performed at [facility], [address] [place], [place] [address]   Respiratory (~20 pathogens) panel by PCR     Status: None   Collection Time: 04/14/23  2:30 AM   Specimen: Nasopharyngeal Swab; Respiratory  Result Value Ref Range Status   Adenovirus NOT DETECTED NOT DETECTED Final   Coronavirus 229E NOT DETECTED NOT DETECTED Final    Comment: (NOTE) The Coronavirus on the Respiratory Panel, DOES NOT test for the novel  Coronavirus (2019 nCoV)    Coronavirus HKU1 NOT DETECTED NOT DETECTED Final   Coronavirus NL63 NOT DETECTED NOT DETECTED Final   Coronavirus OC43 NOT DETECTED NOT DETECTED Final   Metapneumovirus NOT DETECTED NOT DETECTED Final   Rhinovirus / Enterovirus NOT DETECTED NOT DETECTED Final   Influenza A NOT DETECTED NOT DETECTED Final   Influenza B NOT DETECTED NOT DETECTED Final   Parainfluenza Virus 1 NOT DETECTED NOT DETECTED Final   Parainfluenza Virus 2 NOT DETECTED NOT DETECTED Final   Parainfluenza Virus 3 NOT DETECTED NOT DETECTED Final    Parainfluenza Virus 4 NOT DETECTED NOT DETECTED Final   Respiratory Syncytial Virus NOT DETECTED NOT DETECTED Final   Bordetella pertussis NOT DETECTED NOT DETECTED Final   Bordetella Parapertussis NOT DETECTED NOT DETECTED Final   Chlamydophila pneumoniae NOT DETECTED NOT DETECTED Final   Mycoplasma pneumoniae NOT DETECTED NOT DETECTED Final    Comment: Performed at [facility], [address] [place], [place] [address]  MRSA Next Gen by PCR, Nasal     Status: None   Collection Time: 04/14/23  2:30 AM   Specimen: Nasal Mucosa; Nasal Swab  Result Value Ref Range Status   MRSA by PCR Next Gen NOT DETECTED NOT DETECTED Final    Comment: (NOTE) The GeneXpert MRSA Assay (FDA approved for NASAL specimens only), is one component of a comprehensive MRSA colonization surveillance program. It is not intended to diagnose MRSA infection nor to guide or monitor treatment for MRSA infections. Test performance is not FDA approved in patients less than 86 years old. Performed at [facility], [address], [place], [place] [address]  Culture, blood (Routine X 2) w Reflex to ID Panel     Status: None (Preliminary result)   Collection Time: 04/14/23  9:25 AM   Specimen: BLOOD  Result Value Ref Range Status   Specimen Description BLOOD BLOOD RIGHT HAND  Final   Special Requests   Final    BOTTLES DRAWN AEROBIC AND ANAEROBIC Blood Culture adequate volume   Culture   Final    NO GROWTH < 24 HOURS Performed at [facility], [address] [place], [place] [address]    Report Status PENDING  Incomplete  Culture, blood (Routine X 2) w Reflex to ID Panel     Status: None (Preliminary result)   Collection Time: 04/14/23  9:25 AM   Specimen: BLOOD  Result Value Ref Range Status   Specimen Description BLOOD BLOOD LEFT HAND  Final   Special Requests   Final    BOTTLES DRAWN AEROBIC AND ANAEROBIC Blood Culture adequate volume   Culture   Final    NO GROWTH < 24  HOURS Performed at [facility], [address] [place], [place] [address]    Report Status PENDING  Incomplete    Coagulation Studies: Recent Labs    04/15/23 0433  LABPROT 14.6  INR 1.1    Urinalysis: No results for input(s): "COLORURINE", "LABSPEC", "PHURINE", "GLUCOSEU", "HGBUR", "BILIRUBINUR", "KETONESUR", "PROTEINUR", "UROBILINOGEN", "NITRITE", "LEUKOCYTESUR" in the last 72 hours.  Invalid input(s): "APPERANCEUR"    Imaging: US RENAL  Result Date: 04/14/2023 CLINICAL DATA:  Acute renal injury EXAM: RENAL / URINARY TRACT ULTRASOUND COMPLETE COMPARISON:  06/25/2019 FINDINGS: Right Kidney: Renal measurements: 10.9 x 5.6 x 5.4 cm. = volume: 171 mL. Echogenicity within normal limits. No mass or hydronephrosis visualized. Left Kidney: Renal measurements: 11.1 x 6.7 x 5.8 cm. = volume: 210 mL. Echogenicity within normal limits. No mass or hydronephrosis visualized. Bladder: Appears normal for degree of bladder distention. Other: None. IMPRESSION: No acute abnormality noted. Electronically Signed   By: Alcide Clever M.D.   On: 04/14/2023 19:04   NM Pulmonary Perfusion  Result Date: 04/14/2023 CLINICAL DATA:  Chronic shortness of breath, negative D-dimer EXAM: NUCLEAR MEDICINE PERFUSION LUNG SCAN TECHNIQUE: Perfusion images were obtained in multiple projections after intravenous injection of radiopharmaceutical. Ventilation scans intentionally deferred if perfusion scan and chest x-ray adequate for interpretation during COVID 19 epidemic. RADIOPHARMACEUTICALS:  4.11 mCi Tc-48m MAA IV COMPARISON:  Chest x-ray from earlier in the same day, prior perfusion examination from 07/04/2022 FINDINGS: Uptake is noted throughout both lungs. There is a relative paucity of uptake in the bases bilaterally related to the known cardiomegaly and underlying elevated right hemidiaphragm. No focal perfusion defect to suggest acute pulmonary embolism is noted. The overall appearance is stable from the  prior exam. IMPRESSION: No evidence of pulmonary embolism. Electronically Signed   By: Alcide Clever M.D.   On: 04/14/2023 19:03   DG Chest 2 View  Result Date: 04/14/2023 CLINICAL DATA:  Shortness of breath EXAM: CHEST - 2 VIEW COMPARISON:  04/12/2023.  CT today. FINDINGS: Cardiomegaly. Low lung volumes with bibasilar atelectasis. No overt edema or effusions. No acute bony abnormality. IMPRESSION: Cardiomegaly. Low volumes with bibasilar atelectasis. Electronically Signed   By: Charlett Nose M.D.   On: 04/14/2023 17:42   CT CHEST WO CONTRAST  Result Date: 04/14/2023 CLINICAL DATA:  Chronic shortness of breath EXAM: CT CHEST WITHOUT CONTRAST TECHNIQUE: Multidetector CT imaging of the chest was performed following the standard protocol without IV contrast. RADIATION DOSE REDUCTION: This  exam was performed according to the departmental dose-optimization program which includes automated exposure control, adjustment of the mA and/or kV according to patient size and/or use of iterative reconstruction technique. COMPARISON:  02/06/2018 FINDINGS: Cardiovascular: Heart is mildly enlarged. Scattered coronary artery and aortic calcifications. No aneurysm. Mediastinum/Nodes: No mediastinal, hilar, or axillary adenopathy. Trachea and esophagus are unremarkable. Diffuse thyroid enlargement. Lungs/Pleura: Bibasilar atelectasis.  No effusions. Upper Abdomen: No acute findings Musculoskeletal: Chest wall soft tissues are unremarkable. No acute bony abnormality. IMPRESSION: Cardiomegaly, coronary artery disease. Bibasilar atelectasis. No acute cardiopulmonary disease. Diffuse thyroid enlargement. Recommend non emergent thyroid ultrasound (ref: J Am Coll Radiol. 2015 Feb;12(2): 143-50). Aortic Atherosclerosis (ICD10-I70.0). Electronically Signed   By: Charlett Nose M.D.   On: 04/14/2023 17:42   ECHOCARDIOGRAM COMPLETE  Result Date: 04/14/2023    ECHOCARDIOGRAM REPORT   Patient Name:   LUC VEDDER [facility] Date of Exam:  04/14/2023 Medical Rec #:  [id number]              Height:       68.0 in Accession #:    [id number]             Weight:       320.0 lb Date of Birth:  12-Jan-1951              BSA:          2.496 m Patient Age:    72 years               BP:           125/65 mmHg Patient Gender: M                      HR:           86 bpm. Exam Location:  [facility] Procedure: 2D Echo, Cardiac Doppler and Color Doppler Indications:     CHF--acute systolic I50.21  History:         Patient has prior history of Echocardiogram examinations, most                  recent 10/22/2017. CHF, COPD; Risk Factors:Diabetes and                  Hypertension.  Sonographer:     Cristela Blue Referring Phys:  [id number] Emeline General Diagnosing Phys: Clotilde Dieter  Sonographer Comments: Technically challenging study due to limited acoustic windows, no apical window and no subcostal window. Image acquisition challenging due to patient body habitus. IMPRESSIONS  1. Left ventricular ejection fraction, by estimation, is 55 to 60%. Left ventricular ejection fraction by PLAX is 63 %. The left ventricle has normal function. The left ventricle has no regional wall motion abnormalities. Left ventricular diastolic function could not be evaluated.  2. Right ventricular systolic function is normal. The right ventricular size is normal.  3. The mitral valve is grossly normal. No evidence of mitral valve regurgitation.  4. The aortic valve is grossly normal. Aortic valve regurgitation is not visualized. FINDINGS  Left Ventricle: Left ventricular ejection fraction, by estimation, is 55 to 60%. Left ventricular ejection fraction by PLAX is 63 %. The left ventricle has normal function. The left ventricle has no regional wall motion abnormalities. The left ventricular internal cavity size was normal in size. There is no left ventricular hypertrophy. Left ventricular diastolic function could not be evaluated. Right Ventricle: The right ventricular size is normal. No increase in right  ventricular wall thickness.  Right ventricular systolic function is normal. Left Atrium: Left atrial size was normal in size. Right Atrium: Right atrial size was normal in size. Pericardium: There is no evidence of pericardial effusion. Mitral Valve: The mitral valve is grossly normal. No evidence of mitral valve regurgitation. Tricuspid Valve: The tricuspid valve is grossly normal. Tricuspid valve regurgitation is not demonstrated. Aortic Valve: The aortic valve is grossly normal. Aortic valve regurgitation is not visualized. Pulmonic Valve: The pulmonic valve was grossly normal. Pulmonic valve regurgitation is not visualized. Aorta: The aortic root is normal in size and structure. IAS/Shunts: The interatrial septum was not assessed.  LEFT VENTRICLE PLAX 2D LV EF:         Left ventricular ejection fraction by PLAX is 63 %. LVIDd:         3.60 cm LVIDs:         2.40 cm LV PW:         1.40 cm LV IVS:        1.70 cm LVOT diam:     2.30 cm LVOT Area:     4.15 cm  LEFT ATRIUM         Index LA diam:    3.00 cm 1.20 cm/m   AORTA Ao Root diam: 3.50 cm  SHUNTS Systemic Diam: 2.30 cm Rozell Searing Custovic Electronically signed by Clotilde Dieter Signature Date/Time: 04/14/2023/9:18:37 AM    Final    US Venous Img Lower Bilateral (DVT)  Result Date: 04/13/2023 CLINICAL DATA:  Bilateral lower extremity pain and swelling. 3-4 days EXAM: Bilateral LOWER EXTREMITY VENOUS DOPPLER ULTRASOUND TECHNIQUE: Gray-scale sonography with compression, as well as color and duplex ultrasound, were performed to evaluate the deep venous system(s) from the level of the common femoral vein through the popliteal and proximal calf veins. COMPARISON:  None Available. FINDINGS: VENOUS Normal compressibility of the common femoral, superficial femoral, greater saphenous vein and popliteal veins, as well as the visualized calf veins. Patient had difficulty tolerating the procedure as per the sonographer. There is poor visualization of the profunda femoral  veins bilaterally and some of the calf veins. No filling defects to suggest DVT on grayscale or color Doppler imaging. Doppler waveforms show normal direction of venous flow, normal respiratory plasticity and response to augmentation. Limited views of the contralateral common femoral vein are unremarkable. OTHER None. Limitations: Difficulty tolerating the procedure as per the sonographer. Limited visualization as well with overlapping soft tissue IMPRESSION: Limited examination as above. No evidence of DVT bilaterally of the lower extremities above the knee of the visualized vessels. Electronically Signed   By: Karen Kays M.D.   On: 04/13/2023 15:18     Medications:    sodium chloride     azithromycin Stopped (04/14/23 1041)   ceFEPime (MAXIPIME) IV      allopurinol  100 mg Oral Daily   amiodarone  200 mg Oral Daily   aspirin EC  81 mg Oral Daily   carvedilol  6.25 mg Oral BID WC   doxazosin  4 mg Oral QHS   enoxaparin (LOVENOX) injection  0.5 mg/kg Subcutaneous Q24H   fluticasone  2 spray Each Nare BID   fluticasone furoate-vilanterol  1 puff Inhalation Daily   insulin aspart  0-20 Units Subcutaneous TID WC   insulin aspart  0-5 Units Subcutaneous QHS   insulin aspart  5 Units Subcutaneous TID WC   insulin glargine-yfgn  35 Units Subcutaneous BID   isosorbide mononitrate  30 mg Oral Daily   levothyroxine  112 mcg  Oral Q0600   lidocaine  1 patch Transdermal Q24H   linagliptin  5 mg Oral Daily   living well with diabetes book   Does not apply Once   loratadine  10 mg Oral Daily   sodium chloride flush  3 mL Intravenous Q12H   torsemide  40 mg Oral BID   sodium chloride, acetaminophen, albuterol, cyclobenzaprine, ipratropium, LORazepam, morphine injection, ondansetron (ZOFRAN) IV, oxyCODONE, sodium chloride flush, traZODone  Assessment/ Plan:  Jason [place] is a 72 y.o.  male with past medical history of chronic systolic heart failure, atrial fibrillation on  anticoagulation and chronic kidney disease stage IV.  Patient presents to the emergency department complaining of shortness of breath with chest pain and lower extremity edema.  Patient has been admitted for Shortness of breath [R06.02] CHF (congestive heart failure) (HCC) [I50.9] Edema, unspecified type [R60.9] Chest pain, unspecified type [R07.9]   Acute Kidney Injury on chronic kidney disease stage IV with baseline creatinine 2.5 and GFR of 27 on 03/20/23.  Acute kidney injury secondary to fluid overload and aggressive diuresis Chronic kidney disease is secondary to poor diabetic control Awaiting renal ultrasound.  No IV contrast exposure.  Diuretics held due to  increased creatinine.   Creatinine has improved and patient has maintained adequate urine output. Will order home regimen of Torsemide 40mg  twice a day.  Lab Results  Component Value Date   CREATININE 3.12 (H) 04/15/2023   CREATININE 3.27 (H) 04/14/2023   CREATININE 3.21 (H) 04/13/2023    Intake/Output Summary (Last 24 hours) at 04/15/2023 0947 Last data filed at 04/15/2023 0557 Gross per 24 hour  Intake 1898.21 ml  Output 2200 ml  Net -301.79 ml    2.  Acute on chronic diastolic heart failure.  Echo completed this admission shows EF 55 to 60%.  Will restart home regimen of Torsemide today  3. Anemia of chronic kidney disease Lab Results  Component Value Date   HGB 11.3 (L) 04/15/2023    Hemoglobin at goal  4. 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 Home regimen includes [place]. Most recent hemoglobin A1c is 11.3 on 04/13/23.     LOS: 3 Dacian Orrico 8/24/20249:47 AM

## 2023-04-15 NOTE — Progress Notes (Signed)
Progress Note   [name] BJY:[id number] DOB: 01-Jun-1951 DOA: 04/12/2023     3 DOS: the patient was seen and examined on 04/15/2023   Brief hospital course: Taken from H&P.  Jason Hudson is a 72 y.o. male with medical history significant of chronic HFrEF with LVEF 40-45%, PAF not on anticoagulation due to history of severe GI bleed, CKD stage IV, COPD, IDDM with insulin resistance, gout, hypothyroidism, BPH, morbid obesity, presented with increasing shortness of breath and leg swelling and chest pain.   On presentation patient was hemodynamically stable.  Chest x-ray with eventration of right hemidiaphragm with some adjacent atelectasis.  No pulmonary edema.  Labs pertinent for AKI with creatinine at 3.1, baseline seems to be around 2.1-2.4, leukocytosis at 16.3.  Troponin 20 EKG sinus rhythm, chronic ST changes on multiple leads.  Patient was given 80 mg of IV Lasix in ED and started on IV diuresis. Chest pain appears to be musculoskeletal so he was given a trial of lidocaine patch and Flexeril.  8/22: Overnight persistent bandlike chest pain, which increased with body movements and deep breathing, D-dimer was checked and found to be elevated, patient also has CKD stage IV, VQ scan was ordered by night on-call. Repeat troponin 43, A1c 11.3, slight worsening of renal function with BUN 48 and creatinine 3.21, worsening leukocytosis at 20.6.  BNP normal. Holding IV Lasix today, procalcitonin 0.36, COVID PCR negative.  Significant orthopnea and unable to lay down for any scan.  [facility] cardiology was consulted.  Starting him on ceftriaxone and Zithromax for CAP coverage.  8/23: Patient overnight became more dyspneic and febrile at 103.  ABG with mild hypercapnia and hypoxia, due to increased work of breathing he was placed on BiPAP. Respiratory viral panel and MRSA PCR negative.  Worsening leukocytosis and procalcitonin today.  Urine has not been sent to lab despite  multiple reminders. Ordered blood and sputum culture and switching ceftriaxone with cefepime.  UOP of 2450 recorded, creatinine worsening at 3.27.  Cardiology gave another dose of IV Lasix 80 mg yesterday. Patient now meeting sepsis criteria likely secondary to pneumonia, still unable to obtain further imaging.  Ordered some IV fluid, holding Lasix.  8/24: Vital stable, VQ scan negative for PE, renal ultrasound without any acute abnormality.  Chest CT with cardiomegaly, bibasilar atelectasis, diffuse thyroid enlargement with recommendation of nonemergent thyroid ultrasound which can be done by his PCP.  Labs with improving leukocytosis, sodium 133, small improvement to creatinine at 3.12 today.  Remained on 8 L of oxygen.  Home dose of torsemide was restarted today  Assessment and Plan: * Sepsis due to pneumonia [facility]) Patient now meeting sepsis criteria with fever, worsening leukocytosis, and tachypnea.  Likely secondary to pneumonia as there is no other source of infection BNP normal so less likely a CHF exacerbation. Significant leukocytosis and procalcitonin 0.36>>2.02.  Pleuritic chest pain. Early pneumonia versus PE is on differential.  D-dimer elevated but patient also has CKD stage IV so that was inconclusive.  V/Q negative for PE, venous Doppler negative for DVT. -Continue cefepime and Zithromax -Continue to monitor  Acute on chronic systolic congestive heart failure [facility]) Patient was initially admitted with concern of acute on chronic HFrEF, prior echocardiogram done in 2020 with EF of 40 to 45%.  Repeat echo with normal EF, indeterminate diastolic function. BNP normal and patient has chronic lower extremity edema with signs of chronic venous congestion and stasis.  Do not weigh himself regularly.  Chest x-ray without  any pulmonary congestion.  Unable to check JVD due to body habitus. Received IV Lasix which resulted in worsening of creatinine. -Holding more IV diuresis -Restarting home  torsemide as renal function now stable -Cardiology consult at his request  Leukocytosis Leukocytosis started improving.  Procalcitonin at 0.36>>2.02. Chest x-ray with questionable infiltrate/atelectasis. -Started him on ceftriaxone and Zithromax for CAP coverage, he became febrile with worsening leukocytosis, ceftriaxone is being switched to cefepime -Monitor CBC  Chest pain Improved Patient has atypical bandlike chest spasms, increased with any body movement and deep breathing.  Mildly positive troponin likely secondary to demand. -Echocardiogram with improved EF and no wall motion abnormalities  Essential hypertension Blood pressure within goal. -Continue home carvedilol, Cardura, Imdur.  Paroxysmal atrial fibrillation (HCC) Not on any anticoagulation due to history of GI bleed. -Continue amiodarone and aspirin  Chronic kidney disease, stage 4 (severe) (HCC) Likely AKI with history of CKD stage IV.  Creatinine increased to 3.27 today, patient did received IV Lasix.  Baseline appears to be around 2.5.   Renal function started improving -Nephrology restarted home torsemide today -Monitor renal function -Avoid nephrotoxins   Chronic obstructive pulmonary disease (HCC) No wheezing.  Slightly increased cough from his baseline. -Continue home bronchodilators -Patient likely is oxygen dependent, apparently had it at home but was not using regularly  Type 2 diabetes mellitus with chronic kidney disease, with long-term current use of insulin (HCC) Uncontrolled with hyperglycemia and A1c of 11.3.  CBG improved today  Patient is on significant amount of insulin at home. -Continue Semglee to 35 units twice daily instead of 60 daily. -Decreasing mealtime coverage to 5 unit -Continue SSI  Obesity, Class III, BMI 40-49.9 (morbid obesity) (HCC) Estimated body mass index is 48.66 kg/m as calculated from the following:   Height as of this encounter: 5\' 8"  (1.727 m).   Weight as of this  encounter: 145.2 kg.   This will complicate overall prognosis Encouraged weight loss  Obstructive sleep apnea of adult - BiPAP at night, patient is mostly noncompliant at home   Subjective: Patient was sleeping in recliner when seen today.  Easily arousable.  No chest pain but still feeling short of breath with laying down.  Physical Exam: Vitals:   04/15/23 0500 04/15/23 0557 04/15/23 0741 04/15/23 1204  BP:  (!) 141/57 (!) 146/75 114/65  Pulse:  83 80 79  Resp:  18 18 18   Temp:  98.5 F (36.9 C) 98.6 F (37 C) 98 F (36.7 C)  TempSrc:  Oral Oral Oral  SpO2:  96% 98% 99%  Weight: (!) 147.8 kg     Height:       General.  Morbidly obese gentleman, in no acute distress. Pulmonary.  Few scattered rhonchi bilaterally, normal respiratory effort. CV.  Regular rate and rhythm, no JVD, rub or murmur. Abdomen.  Soft, nontender, nondistended, BS positive. CNS.  Alert and oriented .  No focal neurologic deficit. Extremities.  Trace LE edema,  pulses intact and symmetrical.  Signs of chronic venous congestion. Psychiatry.  Judgment and insight appears normal.    Data Reviewed: Prior data reviewed  Family Communication: Discussed with patient, no family at bedside  Disposition: Status is: Inpatient Remains inpatient appropriate because: Severity of illness  Planned Discharge Destination: Home  DVT prophylaxis.  Lovenox Time spent: 45 minutes  This record has been created using Conservation officer, historic buildings. Errors have been sought and corrected,but may not always be located. Such creation errors do not reflect on the standard of  care.   Author: Arnetha Courser, MD 04/15/2023 12:55 PM  For on call review [email or web address].

## 2023-04-15 NOTE — Evaluation (Signed)
Occupational Therapy Evaluation Patient Details Name: Jason Hudson MRN: [id number] DOB: 01/14/1951 Today's Date: 04/15/2023   History of Present Illness Pt is a 72 year old male presenting to the ED with SOB and, leg swelling and chest pain; admitted with sepsis due to pneumonia, acute on chronic systolic congestive heart failure     PMH significant for HFrEF with LVEF 40-45%, PAF not on anticoagulation due to history of severe GI bleed, CKD stage IV, COPD, IDDM with insulin resistance, gout, hypothyroidism, BPH, morbid obesity   Clinical Impression   Chart reviewed, pt recently received pain meds prior to OT evaluation. Pt is alert and oriented x4, however lethargic throughout and frequently falling asleep during session. PTA pt reports generally MOD I with ADL, assist with IADL as needed, amb with no AD. Pt presents with deficits in strength, endurance, activity tolerance, cognition all affecting safe and optimal ADL completion. Pt would benefit form ongoing OT to address deficits and to facilitate return to PLOF. OT will follow acutely.       If plan is discharge home, recommend the following: A little help with walking and/or transfers;A lot of help with bathing/dressing/bathroom;Assistance with cooking/housework;Assist for transportation;Help with stairs or ramp for entrance    Functional Status Assessment  Patient has had a recent decline in their functional status and demonstrates the ability to make significant improvements in function in a reasonable and predictable amount of time.  Equipment Recommendations  BSC/3in1    Recommendations for Other Services       Precautions / Restrictions Precautions Precautions: Fall Restrictions Weight Bearing Restrictions: No      Mobility Bed Mobility               General bed mobility comments: NT in recliner pre/post session    Transfers Overall transfer level: Needs assistance Equipment used: Rolling walker (2  wheels) Transfers: Sit to/from Stand Sit to Stand: Min assist                  Balance Overall balance assessment: Needs assistance Sitting-balance support: Bilateral upper extremity supported, Feet supported Sitting balance-Leahy Scale: Fair     Standing balance support: Bilateral upper extremity supported, During functional activity, Reliant on assistive device for balance Standing balance-Leahy Scale: Fair                             ADL either performed or assessed with clinical judgement   ADL Overall ADL's : Needs assistance/impaired     Grooming: Wash/dry face;Sitting;Supervision/safety           Upper Body Dressing : Minimal assistance Upper Body Dressing Details (indicate cue type and reason): anticipate Lower Body Dressing: Maximal assistance Lower Body Dressing Details (indicate cue type and reason): socks Toilet Transfer: Minimal assistance;Requires wide/bariatric;Rolling walker (2 wheels) Toilet Transfer Details (indicate cue type and reason): simulated with frequent vcs         Functional mobility during ADLs: Contact guard assist;Minimal assistance;Rolling walker (2 wheels) (approx 6' forward, then backward in room)       Vision Patient Visual Report: No change from baseline       Perception         Praxis         Pertinent Vitals/Pain Pain Assessment Pain Assessment: No/denies pain     Extremity/Trunk Assessment Upper Extremity Assessment Upper Extremity Assessment: Overall WFL for tasks assessed   Lower Extremity Assessment Lower Extremity Assessment: Generalized weakness  Cervical / Trunk Assessment Cervical / Trunk Assessment: Normal   Communication Communication Communication: Difficulty following commands/understanding Following commands: Follows one step commands with increased time Cueing Techniques: Verbal cues;Tactile cues   Cognition Arousal: Lethargic (falling alseep multiple times during  evaluation) Behavior During Therapy: Flat affect Overall Cognitive Status: Impaired/Different from baseline Area of Impairment: Problem solving, Following commands, Safety/judgement, Awareness                       Following Commands: Follows one step commands with increased time Safety/Judgement: Decreased awareness of deficits Awareness: Emergent Problem Solving: Slow processing, Requires verbal cues, Requires tactile cues, Decreased initiation       General Comments  spo2 >95% on 8 L via HFNC throughout, HR 81 following mobility; edema noted throughout BLE    Exercises Other Exercises Other Exercises: edu re: role of OT, role of rehab   Shoulder Instructions      Home Living Family/patient expects to be discharged to:: Private residence Living Arrangements: Spouse/significant other;Non-relatives/Friends (girlfriend and friend) Available Help at Discharge: Family;Available 24 hours/day Type of Home: House Home Access: Stairs to enter [employer] of Steps: 4 Entrance Stairs-Rails: Right;Left;Can reach both Home Layout: One level     Bathroom Shower/Tub: Chief Strategy Officer: Standard Bathroom Accessibility: Yes   Home Equipment: Cane - single point          Prior Functioning/Environment Prior Level of Function : Independent/Modified Independent;Needs assist             Mobility Comments: pt reports amb with no AD ADLs Comments: pt reports generally MOD I with ADL, girlfriend assists with cooking/cleaning; pt reports he drives and is retired;        OT Problem List: Decreased strength;Decreased activity tolerance;Decreased knowledge of use of DME or AE;Decreased safety awareness;Impaired balance (sitting and/or standing)      OT Treatment/Interventions: Self-care/ADL training;Therapeutic exercise;Balance training;Energy conservation;DME and/or AE instruction;Therapeutic activities    OT Goals(Current goals can be found in  the care plan section) Acute Rehab OT Goals Patient Stated Goal: get stronger OT Goal Formulation: With patient Time For Goal Achievement: 04/29/23 Potential to Achieve Goals: Good ADL Goals Pt Will Perform Grooming: with modified independence;standing;sitting Pt Will Perform Lower Body Dressing: with modified independence;sit to/from stand Pt Will Transfer to Toilet: with modified independence;ambulating Pt Will Perform Toileting - Clothing Manipulation and hygiene: with modified independence;sit to/from stand  OT Frequency: Min 1X/week    Co-evaluation              AM-PAC OT "6 Clicks" Daily Activity     Outcome Measure Help from another person eating meals?: None Help from another person taking care of personal grooming?: A Little Help from another person toileting, which includes using toliet, bedpan, or urinal?: A Lot Help from another person bathing (including washing, rinsing, drying)?: A Lot Help from another person to put on and taking off regular upper body clothing?: A Little Help from another person to put on and taking off regular lower body clothing?: A Lot 6 Click Score: 16   End of Session Equipment Utilized During Treatment: Rolling walker (2 wheels);Oxygen Nurse Communication:  (NT re pt status)  Activity Tolerance: Patient tolerated treatment well;Patient limited by lethargy Patient left: in chair;with call bell/phone within reach;with chair alarm set  OT Visit Diagnosis: Other abnormalities of gait and mobility (R26.89);Unsteadiness on feet (R26.81)  Time: 1610-9604 OT Time Calculation (min): 13 min Charges:  OT General Charges $OT Visit: 1 Visit OT Evaluation $OT Eval Moderate Complexity: 1 Mod  Oleta Mouse, OTD OTR/L  04/15/23, 1:06 PM

## 2023-04-15 NOTE — Progress Notes (Addendum)
Transition of Care [facility]) - Inpatient Brief Assessment   Patient Details  Name: Raylee Madani MRN: [id number] Date of Birth: [birth year]  [facility] CM/SW Contact:    Bing Quarry, RN Phone Number: 04/15/2023, 11:33 AM   Clinical Narrative: 04/15/23:   On 04/12/23; Admitted from home  via EMS [facility] for generalized chest pain. DX w pneumonia. TOC Brief assessment completed. New SNF recommendations from PT evaluation on 04/13/23 recorded at 344 pm. Pt currently on 8L/HFNC. Cardiology, Diabetic Coordinator, Nephrology, Pharmacy consults completed. IV Antibiotic started this am. Has PCP. No preference on HH per prior CM note if plan changes.  Spouse: Greeson,Chrisy (Significant other) [phone] (Home Phone)   Update: Attempted to speak to patient/significant other regarding new STR recommendations but patient unable to interact effectively per SO who was at bedside, "not doing well". TOC will follow medical progress and disposition planning. 210 pm.   Gabriel Cirri MSN RN CM  Transitions of Care Department [facility] [phone] Weekends Only     Transition of Care Asessment: Insurance and Status: Insurance coverage has been reviewed Patient has primary care physician: Yes Home environment has been reviewed: Lives at home with spouse with some steps into home with rails per PT evaluation. Prior level of function:: Independent/Modified Independent;Needs assist  Physical Assist : toileting and pericare assist was needed. Prior/Current Home Services: No current home services Social Determinants of Health Reivew: SDOH reviewed no interventions necessary Readmission risk has been reviewed: Yes (21%) Transition of care needs: transition of care needs identified, TOC will continue to follow (New SNF rec per PT, does have 24 hour support at home if [facility] is chosen at final disposition.)

## 2023-04-15 NOTE — Progress Notes (Signed)
   04/15/23 2038  BiPAP/CPAP/SIPAP  $ Non-Invasive Ventilator  Non-Invasive Vent Subsequent  BiPAP/CPAP/SIPAP Pt Type Adult  BiPAP/CPAP/SIPAP V60  Reason BIPAP/CPAP not in use Non-compliant (pt refused to wear cpap for tonight)  Mask Type Full face mask  Mask Size Large  Set Rate 10 breaths/min  IPAP 15 cmH20  EPAP 5 cmH2O  FiO2 (%) 45 %

## 2023-04-15 NOTE — Progress Notes (Signed)
Patient with no urine documented this shift bladder scan showed 475 ml. In and out cath done per protocol and 1000 ml of yellow clear urine obtained. Patient tolerated well. Dr. Nelson Chimes notified

## 2023-04-15 NOTE — Assessment & Plan Note (Signed)
No wheezing.  Slightly increased cough from his baseline. -Continue home bronchodilators -Patient likely is oxygen dependent, apparently had it at home but was not using regularly

## 2023-04-16 DIAGNOSIS — J189 Pneumonia, unspecified organism: Secondary | ICD-10-CM | POA: Diagnosis not present

## 2023-04-16 DIAGNOSIS — D72829 Elevated white blood cell count, unspecified: Secondary | ICD-10-CM | POA: Diagnosis not present

## 2023-04-16 DIAGNOSIS — I5023 Acute on chronic systolic (congestive) heart failure: Secondary | ICD-10-CM | POA: Diagnosis not present

## 2023-04-16 DIAGNOSIS — R079 Chest pain, unspecified: Secondary | ICD-10-CM | POA: Diagnosis not present

## 2023-04-16 LAB — CBC
HCT: 37.2 % — ABNORMAL LOW (ref 39.0–52.0)
Hemoglobin: 11.9 g/dL — ABNORMAL LOW (ref 13.0–17.0)
MCH: 31.3 pg (ref 26.0–34.0)
MCHC: 32 g/dL (ref 30.0–36.0)
MCV: 97.9 fL (ref 80.0–100.0)
Platelets: 201 10*3/uL (ref 150–400)
RBC: 3.8 MIL/uL — ABNORMAL LOW (ref 4.22–5.81)
RDW: 14 % (ref 11.5–15.5)
WBC: 14.3 10*3/uL — ABNORMAL HIGH (ref 4.0–10.5)
nRBC: 0 % (ref 0.0–0.2)

## 2023-04-16 LAB — GLUCOSE, CAPILLARY
Glucose-Capillary: 94 mg/dL (ref 70–99)
Glucose-Capillary: 156 mg/dL — ABNORMAL HIGH (ref 70–99)
Glucose-Capillary: 141 mg/dL — ABNORMAL HIGH (ref 70–99)
Glucose-Capillary: 122 mg/dL — ABNORMAL HIGH (ref 70–99)

## 2023-04-16 LAB — BASIC METABOLIC PANEL
Anion gap: 12 (ref 5–15)
BUN: 63 mg/dL — ABNORMAL HIGH (ref 8–23)
CO2: 28 mmol/L (ref 22–32)
Calcium: 9.6 mg/dL (ref 8.9–10.3)
Chloride: 90 mmol/L — ABNORMAL LOW (ref 98–111)
Creatinine, Ser: 2.88 mg/dL — ABNORMAL HIGH (ref 0.61–1.24)
GFR, Estimated: 23 mL/min — ABNORMAL LOW (ref >60)
Glucose, Bld: 86 mg/dL (ref 70–99)
Potassium: 4 mmol/L (ref 3.5–5.1)
Sodium: 130 mmol/L — ABNORMAL LOW (ref 135–145)

## 2023-04-16 NOTE — Progress Notes (Signed)
Patient received as transfer. Alert and verbal. Able to make needs and wants known. Patient's spouse is at bedside. Patient requested to be pulled up in bed, completed with assistance from another staff. Purewick in place. Oxygen at 4 lpm via [place].   Cornell Barman Chloe Miyoshi

## 2023-04-16 NOTE — Progress Notes (Signed)
[facility]  ROUNDING NOTE   Subjective:   Jason Hudson is a 73 y.o. male with past medical history of chronic systolic heart failure, atrial fibrillation on anticoagulation and chronic kidney disease stage IV.  Patient presents to the emergency department complaining of shortness of breath with chest pain and lower extremity edema.  Patient has been admitted for Shortness of breath [R06.02] CHF (congestive heart failure) (HCC) [I50.9] Edema, unspecified type [R60.9] Chest pain, unspecified type [R07.9]  Patient is known to our practice and is followed outpatient by Dr. Wynelle Link.  Patient was last seen in office on April 04, 2023 for routine follow-up.   Patient sitting up in chair No family present Weaned to 5L [place]   Creatinine 2.88 Urine output 2.1L in past 24 hours  Objective:  Vital signs in last 24 hours:  Temp:  [97.6 F (36.4 C)-98.4 F (36.9 C)] 98.1 F (36.7 C) (08/25 0758) Pulse Rate:  [73-83] 78 (08/25 0758) Resp:  [18-32] 20 (08/25 0758) BP: (126-149)/(72-90) 134/88 (08/25 0758) SpO2:  [98 %-100 %] 98 % (08/25 0758) FiO2 (%):  [45 %] 45 % (08/24 2038)  Weight change:  Filed Weights   04/13/23 0153 04/15/23 0500  Weight: (!) 145.2 kg (!) 147.8 kg    Intake/Output: I/O last 3 completed shifts: In: 1678.3 [P.O.:360; I.V.:968.3; IV Piggyback:350] Out: 3550 [Urine:3550]   Intake/Output this shift:  Total I/O In: 450 [IV Piggyback:450] Out: 1000 [Urine:1000]  Physical Exam: General: NAD, sitting in chair  Head: Normocephalic, atraumatic. Moist oral mucosal membranes  Eyes: Anicteric  Neck: Supple  Lungs:  Basilar fine crackles, HFNC  Heart: Regular rate and rhythm  Abdomen:  Soft, nontender, obese  Extremities: 1-2+ peripheral edema.  Neurologic: Nonfocal, moving all four extremities  Skin: No lesions  Access: None    Basic Metabolic Panel: Recent Labs  Lab 04/12/23 1305 04/13/23 0148 04/14/23 0515 04/15/23 0433  04/16/23 0543  NA 130* 133* 136 133* 130*  K 4.7 4.3 4.2 3.9 4.0  CL 89* 89* 89* 93* 90*  CO2 26 30 30 31 28   GLUCOSE 459* 284* 129* 70 86  BUN 44* 48* 57* 56* 63*  CREATININE 3.13* 3.21* 3.27* 3.12* 2.88*  CALCIUM 11.3* 12.0* 11.3* 9.9 9.6  MG  --   --  1.6*  --   --     Liver Function Tests: Recent Labs  Lab 04/13/23 0148  AST 31  ALT 25  ALKPHOS 81  BILITOT 0.9  PROT 8.3*  ALBUMIN 3.9   Recent Labs  Lab 04/13/23 0148  LIPASE 28   No results for input(s): "AMMONIA" in the last 168 hours.  CBC: Recent Labs  Lab 04/12/23 1305 04/13/23 0148 04/14/23 0515 04/15/23 0433 04/16/23 0543  WBC 16.3* 20.6* 31.1* 17.6* 14.3*  HGB 12.5* 13.6 11.7* 11.3* 11.9*  HCT 38.0* 40.8 36.0* 33.6* 37.2*  MCV 93.4 95.1 95.7 95.2 97.9  PLT 196 226 187 169 201    Cardiac Enzymes: Recent Labs  Lab 04/12/23 1535  CKTOTAL 82    BNP: Invalid input(s): "POCBNP"  CBG: Recent Labs  Lab 04/15/23 0739 04/15/23 1200 04/15/23 1614 04/15/23 2158 04/16/23 0754  GLUCAP 78 125* 89 121* 94    Microbiology: Results for orders placed or performed during the hospital encounter of 04/12/23  SARS Coronavirus 2 by RT PCR (hospital order, performed in [facility] hospital lab) *cepheid single result test* Anterior Nasal Swab     Status: None   Collection Time: 04/13/23 10:53 AM  Specimen: Anterior Nasal Swab  Result Value Ref Range Status   SARS Coronavirus 2 by RT PCR NEGATIVE NEGATIVE Final    Comment: (NOTE) SARS-CoV-2 target nucleic acids are NOT DETECTED.  The SARS-CoV-2 RNA is generally detectable in upper and lower respiratory specimens during the acute phase of infection. The lowest concentration of SARS-CoV-2 viral copies this assay can detect is 250 copies / mL. A negative result does not preclude SARS-CoV-2 infection and should not be used as the sole basis for treatment or other patient management decisions.  A negative result may occur with improper specimen  collection / handling, submission of specimen other than nasopharyngeal swab, presence of viral mutation(s) within the areas targeted by this assay, and inadequate number of viral copies (<250 copies / mL). A negative result must be combined with clinical observations, patient history, and epidemiological information.  Fact Sheet for Patients:   RoadLapTop.co.za  Fact Sheet for Healthcare Providers: http://kim-miller.com/  This test is not yet approved or  cleared by the [place] FDA and has been authorized for detection and/or diagnosis of SARS-CoV-2 by FDA under an Emergency Use Authorization (EUA).  This EUA will remain in effect (meaning this test can be used) for the duration of the COVID-19 declaration under Section 564(b)(1) of the Act, 21 U.S.C. section 360bbb-3(b)(1), unless the authorization is terminated or revoked sooner.  Performed at [facility], [address] [place], [place] [address]   Respiratory (~20 pathogens) panel by PCR     Status: None   Collection Time: 04/14/23  2:30 AM   Specimen: Nasopharyngeal Swab; Respiratory  Result Value Ref Range Status   Adenovirus NOT DETECTED NOT DETECTED Final   Coronavirus 229E NOT DETECTED NOT DETECTED Final    Comment: (NOTE) The Coronavirus on the Respiratory Panel, DOES NOT test for the novel  Coronavirus (2019 nCoV)    Coronavirus HKU1 NOT DETECTED NOT DETECTED Final   Coronavirus NL63 NOT DETECTED NOT DETECTED Final   Coronavirus OC43 NOT DETECTED NOT DETECTED Final   Metapneumovirus NOT DETECTED NOT DETECTED Final   Rhinovirus / Enterovirus NOT DETECTED NOT DETECTED Final   Influenza A NOT DETECTED NOT DETECTED Final   Influenza B NOT DETECTED NOT DETECTED Final   Parainfluenza Virus 1 NOT DETECTED NOT DETECTED Final   Parainfluenza Virus 2 NOT DETECTED NOT DETECTED Final   Parainfluenza Virus 3 NOT DETECTED NOT DETECTED Final   Parainfluenza Virus 4  NOT DETECTED NOT DETECTED Final   Respiratory Syncytial Virus NOT DETECTED NOT DETECTED Final   Bordetella pertussis NOT DETECTED NOT DETECTED Final   Bordetella Parapertussis NOT DETECTED NOT DETECTED Final   Chlamydophila pneumoniae NOT DETECTED NOT DETECTED Final   Mycoplasma pneumoniae NOT DETECTED NOT DETECTED Final    Comment: Performed at [facility], [address], [place], [place] [address]  MRSA Next Gen by PCR, Nasal     Status: None   Collection Time: 04/14/23  2:30 AM   Specimen: Nasal Mucosa; Nasal Swab  Result Value Ref Range Status   MRSA by PCR Next Gen NOT DETECTED NOT DETECTED Final    Comment: (NOTE) The GeneXpert MRSA Assay (FDA approved for NASAL specimens only), is one component of a comprehensive MRSA colonization surveillance program. It is not intended to diagnose MRSA infection nor to guide or monitor treatment for MRSA infections. Test performance is not FDA approved in patients less than 51 years old. Performed at [facility], [address] [place], [place] [address]  Culture, blood (Routine X 2) w Reflex to ID Panel     Status: None (Preliminary result)   Collection Time: 04/14/23  9:25 AM   Specimen: BLOOD  Result Value Ref Range Status   Specimen Description BLOOD BLOOD RIGHT HAND  Final   Special Requests   Final    BOTTLES DRAWN AEROBIC AND ANAEROBIC Blood Culture adequate volume   Culture   Final    NO GROWTH 2 DAYS Performed at [facility], [address] [place], [place] [address]    Report Status PENDING  Incomplete  Culture, blood (Routine X 2) w Reflex to ID Panel     Status: None (Preliminary result)   Collection Time: 04/14/23  9:25 AM   Specimen: BLOOD  Result Value Ref Range Status   Specimen Description BLOOD BLOOD LEFT HAND  Final   Special Requests   Final    BOTTLES DRAWN AEROBIC AND ANAEROBIC Blood Culture adequate volume   Culture   Final    NO GROWTH 2 DAYS Performed at [facility], [address], [place], [place] [address]    Report Status PENDING  Incomplete    Coagulation Studies: Recent Labs    04/15/23 0433  LABPROT 14.6  INR 1.1    Urinalysis: No results for input(s): "COLORURINE", "LABSPEC", "PHURINE", "GLUCOSEU", "HGBUR", "BILIRUBINUR", "KETONESUR", "PROTEINUR", "UROBILINOGEN", "NITRITE", "LEUKOCYTESUR" in the last 72 hours.  Invalid input(s): "APPERANCEUR"    Imaging: US RENAL  Result Date: 04/14/2023 CLINICAL DATA:  Acute renal injury EXAM: RENAL / URINARY TRACT ULTRASOUND COMPLETE COMPARISON:  06/25/2019 FINDINGS: Right Kidney: Renal measurements: 10.9 x 5.6 x 5.4 cm. = volume: 171 mL. Echogenicity within normal limits. No mass or hydronephrosis visualized. Left Kidney: Renal measurements: 11.1 x 6.7 x 5.8 cm. = volume: 210 mL. Echogenicity within normal limits. No mass or hydronephrosis visualized. Bladder: Appears normal for degree of bladder distention. Other: None. IMPRESSION: No acute abnormality noted. Electronically Signed   By: Alcide Clever M.D.   On: 04/14/2023 19:04   NM Pulmonary Perfusion  Result Date: 04/14/2023 CLINICAL DATA:  Chronic shortness of breath, negative D-dimer EXAM: NUCLEAR MEDICINE PERFUSION LUNG SCAN TECHNIQUE: Perfusion images were obtained in multiple projections after intravenous injection of radiopharmaceutical. Ventilation scans intentionally deferred if perfusion scan and chest x-ray adequate for interpretation during COVID 19 epidemic. RADIOPHARMACEUTICALS:  4.11 mCi Tc-7m MAA IV COMPARISON:  Chest x-ray from earlier in the same day, prior perfusion examination from 07/04/2022 FINDINGS: Uptake is noted throughout both lungs. There is a relative paucity of uptake in the bases bilaterally related to the known cardiomegaly and underlying elevated right hemidiaphragm. No focal perfusion defect to suggest acute pulmonary embolism is noted. The overall appearance is stable from the prior exam. IMPRESSION: No evidence of  pulmonary embolism. Electronically Signed   By: Alcide Clever M.D.   On: 04/14/2023 19:03   DG Chest 2 View  Result Date: 04/14/2023 CLINICAL DATA:  Shortness of breath EXAM: CHEST - 2 VIEW COMPARISON:  04/12/2023.  CT today. FINDINGS: Cardiomegaly. Low lung volumes with bibasilar atelectasis. No overt edema or effusions. No acute bony abnormality. IMPRESSION: Cardiomegaly. Low volumes with bibasilar atelectasis. Electronically Signed   By: Charlett Nose M.D.   On: 04/14/2023 17:42   CT CHEST WO CONTRAST  Result Date: 04/14/2023 CLINICAL DATA:  Chronic shortness of breath EXAM: CT CHEST WITHOUT CONTRAST TECHNIQUE: Multidetector CT imaging of the chest was performed following the standard protocol without IV contrast. RADIATION DOSE REDUCTION: This exam was  performed according to the departmental dose-optimization program which includes automated exposure control, adjustment of the mA and/or kV according to patient size and/or use of iterative reconstruction technique. COMPARISON:  02/06/2018 FINDINGS: Cardiovascular: Heart is mildly enlarged. Scattered coronary artery and aortic calcifications. No aneurysm. Mediastinum/Nodes: No mediastinal, hilar, or axillary adenopathy. Trachea and esophagus are unremarkable. Diffuse thyroid enlargement. Lungs/Pleura: Bibasilar atelectasis.  No effusions. Upper Abdomen: No acute findings Musculoskeletal: Chest wall soft tissues are unremarkable. No acute bony abnormality. IMPRESSION: Cardiomegaly, coronary artery disease. Bibasilar atelectasis. No acute cardiopulmonary disease. Diffuse thyroid enlargement. Recommend non emergent thyroid ultrasound (ref: J Am Coll Radiol. 2015 Feb;12(2): 143-50). Aortic Atherosclerosis (ICD10-I70.0). Electronically Signed   By: Charlett Nose M.D.   On: 04/14/2023 17:42     Medications:    sodium chloride     azithromycin 500 mg (04/16/23 0931)   ceFEPime (MAXIPIME) IV 2 g (04/16/23 1053)    allopurinol  100 mg Oral Daily   amiodarone   200 mg Oral Daily   aspirin EC  81 mg Oral Daily   carvedilol  6.25 mg Oral BID WC   doxazosin  4 mg Oral QHS   enoxaparin (LOVENOX) injection  0.5 mg/kg Subcutaneous Q24H   fluticasone  2 spray Each Nare BID   fluticasone furoate-vilanterol  1 puff Inhalation Daily   insulin aspart  0-20 Units Subcutaneous TID WC   insulin aspart  0-5 Units Subcutaneous QHS   insulin aspart  5 Units Subcutaneous TID WC   insulin glargine-yfgn  35 Units Subcutaneous BID   isosorbide mononitrate  30 mg Oral Daily   levothyroxine  112 mcg Oral Q0600   lidocaine  1 patch Transdermal Q24H   linagliptin  5 mg Oral Daily   living well with diabetes book   Does not apply Once   loratadine  10 mg Oral Daily   sodium chloride flush  3 mL Intravenous Q12H   torsemide  40 mg Oral BID   sodium chloride, acetaminophen, albuterol, cyclobenzaprine, ipratropium, LORazepam, morphine injection, ondansetron (ZOFRAN) IV, oxyCODONE, sodium chloride flush, traZODone  Assessment/ Plan:  Mr. Jason Hudson is a 72 y.o.  male with past medical history of chronic systolic heart failure, atrial fibrillation on anticoagulation and chronic kidney disease stage IV.  Patient presents to the emergency department complaining of shortness of breath with chest pain and lower extremity edema.  Patient has been admitted for Shortness of breath [R06.02] CHF (congestive heart failure) (HCC) [I50.9] Edema, unspecified type [R60.9] Chest pain, unspecified type [R07.9]   Acute Kidney Injury on chronic kidney disease stage IV with baseline creatinine 2.5 and GFR of 27 on 03/20/23.  Acute kidney injury secondary to fluid overload and aggressive diuresis Chronic kidney disease is secondary to poor diabetic control Awaiting renal ultrasound.  No IV contrast exposure.  Diuretics held due to  increased creatinine.   Creatinine continues to improve, Adequate urine output noted. No acute indication for dialysis. Will continue current  diuretic regimen.   Lab Results  Component Value Date   CREATININE 2.88 (H) 04/16/2023   CREATININE 3.12 (H) 04/15/2023   CREATININE 3.27 (H) 04/14/2023    Intake/Output Summary (Last 24 hours) at 04/16/2023 1208 Last data filed at 04/16/2023 1147 Gross per 24 hour  Intake 920 ml  Output 3100 ml  Net -2180 ml    2.  Acute on chronic diastolic heart failure.  Echo completed this admission shows EF 55 to 60%.  Continue Torsemide   3. Anemia of chronic kidney  disease Lab Results  Component Value Date   HGB 11.9 (L) 04/16/2023    Hemoglobin stable.   4. 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 Home regimen includes [place]. Most recent hemoglobin A1c is 11.3 on 04/13/23.   5. Hyponatremia likely secondary to volume status. Placed on fluid restriction and continue diuresis.     LOS: 4 Jason Hudson 8/25/202412:08 PM

## 2023-04-16 NOTE — Progress Notes (Signed)
Physical Therapy Treatment Patient Details Name: Jason Hudson MRN: [id number] DOB: November 03, 1950 Today's Date: 04/16/2023   History of Present Illness Jason Hudson is a 72 y.o. male with a history of hypertension, diabetes, CKD, CAD, dismal atrial fibrillation, CHF, amyloid cyst, and COPD who presents to hospital with sepsis due to pneumonia.    PT Comments  Pt is a 72 yo white male that presents supine in bed with 4L 02 via [place], agreeable to PT and with wife present. Wife assisted during session with 02 line management. Pt demonstrates progress towards goals with improved mobility as evidenced by ability to ambulate with CGA into hallway. He will continue to benefit from skilled PT to improve mobility and activity tolerance to return home safely.     If plan is discharge home, recommend the following: A little help with walking and/or transfers;A lot of help with bathing/dressing/bathroom;Assist for transportation;Assistance with cooking/housework   Can travel by private vehicle     No  Equipment Recommendations  Rolling walker (2 wheels)    Recommendations for Other Services       Precautions / Restrictions Precautions Precautions: Fall Restrictions Weight Bearing Restrictions: No     Mobility  Bed Mobility Overal bed mobility: Modified Independent Bed Mobility: Supine to Sit, Sit to Supine     Supine to sit: Modified independent (Device/Increase time) Sit to supine: Modified independent (Device/Increase time)        Transfers Overall transfer level: Modified independent Equipment used: Rolling walker (2 wheels)   Sit to Stand: Contact guard assist           General transfer comment: Needs height of bed increased    Ambulation/Gait Ambulation/Gait assistance: Contact guard assist Gait Distance (Feet): 50 Feet Assistive device: Rolling walker (2 wheels) Gait Pattern/deviations: Decreased stride length Gait velocity: Slow          Stairs             Wheelchair Mobility     Tilt Bed    Modified Rankin (Stroke Patients Only)       Balance Overall balance assessment: Modified Independent Sitting-balance support: Feet unsupported, No upper extremity supported Sitting balance-Leahy Scale: Good       Standing balance-Leahy Scale: Good                              Cognition Arousal: Alert Behavior During Therapy: Restless, Agitated Overall Cognitive Status: Within Functional Limits for tasks assessed                                          Exercises      General Comments        Pertinent Vitals/Pain Pain Assessment Pain Assessment: No/denies pain    Home Living Family/patient expects to be discharged to:: Private residence Living Arrangements: Spouse/significant other                      Prior Function            PT Goals (current goals can now be found in the care plan section) Acute Rehab PT Goals Patient Stated Goal: To improve breathing PT Goal Formulation: With patient/family Potential to Achieve Goals: Good Progress towards PT goals: Progressing toward goals    Frequency    Min 1X/week  PT Plan      Co-evaluation              AM-PAC PT "6 Clicks" Mobility   Outcome Measure  Help needed turning from your back to your side while in a flat bed without using bedrails?: A Little Help needed moving from lying on your back to sitting on the side of a flat bed without using bedrails?: A Little Help needed moving to and from a bed to a chair (including a wheelchair)?: A Little Help needed standing up from a chair using your arms (e.g., wheelchair or bedside chair)?: A Little Help needed to walk in hospital room?: A Little Help needed climbing 3-5 steps with a railing? : Total 6 Click Score: 16    End of Session Equipment Utilized During Treatment: Gait belt Activity Tolerance: Patient tolerated treatment  well Patient left: in bed;with family/visitor present;with call bell/phone within reach   PT Visit Diagnosis: Difficulty in walking, not elsewhere classified (R26.2)     Time: 0454-0981 PT Time Calculation (min) (ACUTE ONLY): 25 min  Charges:    $Therapeutic Activity: 23-37 mins PT General Charges $$ ACUTE PT VISIT: 1 Visit                     Jason Hudson PT, DPT

## 2023-04-16 NOTE — Progress Notes (Signed)
Progress Note   [name] NFA:[id number] DOB: Mar 31, 1951 DOA: 04/12/2023     4 DOS: the patient was seen and examined on 04/16/2023   Brief hospital course: Taken from H&P.  Jason Hudson is a 72 y.o. male with medical history significant of chronic HFrEF with LVEF 40-45%, PAF not on anticoagulation due to history of severe GI bleed, CKD stage IV, COPD, IDDM with insulin resistance, gout, hypothyroidism, BPH, morbid obesity, presented with increasing shortness of breath and leg swelling and chest pain.   On presentation patient was hemodynamically stable.  Chest x-ray with eventration of right hemidiaphragm with some adjacent atelectasis.  No pulmonary edema.  Labs pertinent for AKI with creatinine at 3.1, baseline seems to be around 2.1-2.4, leukocytosis at 16.3.  Troponin 20 EKG sinus rhythm, chronic ST changes on multiple leads.  Patient was given 80 mg of IV Lasix in ED and started on IV diuresis. Chest pain appears to be musculoskeletal so he was given a trial of lidocaine patch and Flexeril.  8/22: Overnight persistent bandlike chest pain, which increased with body movements and deep breathing, D-dimer was checked and found to be elevated, patient also has CKD stage IV, VQ scan was ordered by night on-call. Repeat troponin 43, A1c 11.3, slight worsening of renal function with BUN 48 and creatinine 3.21, worsening leukocytosis at 20.6.  BNP normal. Holding IV Lasix today, procalcitonin 0.36, COVID PCR negative.  Significant orthopnea and unable to lay down for any scan.  [facility] cardiology was consulted.  Starting him on ceftriaxone and Zithromax for CAP coverage.  8/23: Patient overnight became more dyspneic and febrile at 103.  ABG with mild hypercapnia and hypoxia, due to increased work of breathing he was placed on BiPAP. Respiratory viral panel and MRSA PCR negative.  Worsening leukocytosis and procalcitonin today.  Urine has not been sent to lab despite  multiple reminders. Ordered blood and sputum culture and switching ceftriaxone with cefepime.  UOP of 2450 recorded, creatinine worsening at 3.27.  Cardiology gave another dose of IV Lasix 80 mg yesterday. Patient now meeting sepsis criteria likely secondary to pneumonia, still unable to obtain further imaging.  Ordered some IV fluid, holding Lasix.  8/24: Vital stable, VQ scan negative for PE, renal ultrasound without any acute abnormality.  Chest CT with cardiomegaly, bibasilar atelectasis, diffuse thyroid enlargement with recommendation of nonemergent thyroid ultrasound which can be done by his PCP.  Labs with improving leukocytosis, sodium 133, small improvement to creatinine at 3.12 today.  Remained on 8 L of oxygen.  Home dose of torsemide was restarted today.  8/25: Renal functions and leukocytosis improving, on 5 L of oxygen, UOP of 2100.  PT and OT recommending SNF.  TOC to find placement  Assessment and Plan: * Sepsis due to pneumonia [facility]) Patient now meeting sepsis criteria with fever, worsening leukocytosis, and tachypnea.  Likely secondary to pneumonia as there is no other source of infection BNP normal so less likely a CHF exacerbation. Significant leukocytosis and procalcitonin 0.36>>2.02.  Pleuritic chest pain-resolved V/Q negative for PE, venous Doppler negative for DVT. -Continue cefepime and Zithromax-will complete a 5-day course -Continue to monitor  Acute on chronic systolic congestive heart failure (HCC) Patient was initially admitted with concern of acute on chronic HFrEF, prior echocardiogram done in 2020 with EF of 40 to 45%.  Repeat echo with normal EF, indeterminate diastolic function. BNP normal and patient has chronic lower extremity edema with signs of chronic venous congestion and stasis.  Do  not weigh himself regularly.  Chest x-ray without any pulmonary congestion.  Unable to check JVD due to body habitus. Received IV Lasix which resulted in worsening of  creatinine. -Holding more IV diuresis -Restarting home torsemide as renal function now stable -Cardiology consult at his request  Leukocytosis Leukocytosis  improving.  Procalcitonin at 0.36>>2.02. Chest x-ray with questionable infiltrate/atelectasis. -Started him on ceftriaxone and Zithromax for CAP coverage, he became febrile with worsening leukocytosis, ceftriaxone is being switched to cefepime -Monitor CBC  Chest pain Improved Patient has atypical bandlike chest spasms, increased with any body movement and deep breathing.  Mildly positive troponin likely secondary to demand. -Echocardiogram with improved EF and no wall motion abnormalities  Essential hypertension Blood pressure within goal. -Continue home carvedilol, Cardura, Imdur.  Paroxysmal atrial fibrillation (HCC) Not on any anticoagulation due to history of GI bleed. -Continue amiodarone and aspirin  Chronic kidney disease, stage 4 (severe) (HCC) Likely AKI with history of CKD stage IV.  Creatinine slowly improving, at 2.88 today,   Baseline appears to be around 2.5.   Renal function started improving -Nephrology restarted home torsemide on 8/24 -Monitor renal function -Avoid nephrotoxins   Chronic obstructive pulmonary disease (HCC) No wheezing.  Slightly increased cough from his baseline. -Continue home bronchodilators -Patient likely is oxygen dependent, apparently had it at home but was not using regularly  Type 2 diabetes mellitus with chronic kidney disease, with long-term current use of insulin (HCC) Uncontrolled with hyperglycemia and A1c of 11.3.  CBG improved today  Patient is on significant amount of insulin at home. -Continue Semglee to 35 units twice daily instead of 60 daily. -Decreasing mealtime coverage to 5 unit -Continue SSI  Obesity, Class III, BMI 40-49.9 (morbid obesity) (HCC) Estimated body mass index is 48.66 kg/m as calculated from the following:   Height as of this encounter: 5\' 8"   (1.727 m).   Weight as of this encounter: 145.2 kg.   This will complicate overall prognosis Encouraged weight loss  Obstructive sleep apnea of adult - BiPAP at night, patient is mostly noncompliant at home   Subjective: Patient was sitting comfortably on chair when seen today.  Complaining of back pain which is chronic.  Encourage mobility.  Physical Exam: Vitals:   04/16/23 0022 04/16/23 0547 04/16/23 0758 04/16/23 1221  BP: (!) 135/90 (!) 149/85 134/88 127/63  Pulse: 83 79 78 73  Resp: 20 20 20 20   Temp: 98 F (36.7 C) 98.3 F (36.8 C) 98.1 F (36.7 C) 97.7 F (36.5 C)  TempSrc:   Oral Oral  SpO2: 100% 98% 98% 99%  Weight:      Height:       General.  Morbidly obese gentleman, in no acute distress. Pulmonary.  Lungs clear bilaterally, normal respiratory effort. CV.  Regular rate and rhythm, no JVD, rub or murmur. Abdomen.  Soft, nontender, nondistended, BS positive. CNS.  Alert and oriented .  No focal neurologic deficit. Extremities.  No edema, no cyanosis, pulses intact and symmetrical. Psychiatry.  Judgment and insight appears normal.     Data Reviewed: Prior data reviewed  Family Communication: Discussed with girlfriend at bedside and sister on phone  Disposition: Status is: Inpatient Remains inpatient appropriate because: Severity of illness  Planned Discharge Destination: Home  DVT prophylaxis.  Lovenox Time spent: 42 minutes  This record has been created using Conservation officer, historic buildings. Errors have been sought and corrected,but may not always be located. Such creation errors do not reflect on the standard of care.  Author: Arnetha Courser, MD 04/16/2023 1:41 PM  For on call review [email or web address].

## 2023-04-16 NOTE — TOC Progression Note (Signed)
Transition of Care [facility]) - Progression Note    Patient Details  Name: Markael Hyder MRN: [id number] Date of Birth: [birth year]  Transition of Care [facility]) CM/SW Contact  Bing Quarry, RN Phone Number: 04/16/2023, 12:18 PM  Clinical Narrative:  8/25: Patient's sister reviewed Medicare.gov choice list for [place] via email provided and choices are now [facility], [facility] [place], [facility], [facility]. New PASRR obtained, FL2 signed and CSW referral made to those facilities.   Gabriel Cirri MSN RN CM  Transitions of Care Department [facility] [phone] Weekends Only     Expected Discharge Plan: Home/Self Care Barriers to Discharge: Continued Medical Work up  Expected Discharge Plan and Services       Living arrangements for the past 2 months: Single Family Home                                       Social Determinants of Health (SDOH) Interventions SDOH Screenings   Food Insecurity: No Food Insecurity (04/13/2023)  Housing: Low Risk  (04/13/2023)  Transportation Needs: No Transportation Needs (04/13/2023)  Utilities: Not At Risk (04/13/2023)  Alcohol Screen: Low Risk  (05/11/2022)  Depression (PHQ2-9): Low Risk  (05/11/2022)  Tobacco Use: Low Risk  (04/13/2023)    Readmission Risk Interventions    04/14/2023   10:31 AM  Readmission Risk Prevention Plan  Transportation Screening Complete  PCP or Specialist Appt within 3-5 Days Complete  HRI or Home Care Consult Complete  Social Work Consult for Recovery Care Planning/Counseling Complete  Palliative Care Screening Not Complete  Medication Review Oceanographer) Complete

## 2023-04-16 NOTE — TOC Progression Note (Signed)
Transition of Care [facility]) - Progression Note    Patient Details  Name: Jason Hudson MRN: [id number] Date of Birth: 20-May-1951  Transition of Care [facility]) CM/SW Contact  Bing Quarry, RN Phone Number: 04/16/2023, 11:13 AM  Clinical Narrative:  8/25: Spoke with sister, who lives in [place], and stated she has HCPOA papers to bring up Tuesday. She has just spoken to provider and RN CM explained role of CM and process of STR/SNF bed search and will email her Medicare.gov choice list. In the meantime she gave verbal permission to go ahead and start a request at [facility], [facility] [place], and [facility]. She has multiple questions about insurance coverages and RN CM encouraged her to reach out to his insurance provider but that would also be addressed with facility and insurance authorization via HTA. Patient has not been in a facility before. His orientation and memory has not been good but improving. She wishes to be the main contact person. She may also be interested in having Chaplain/Advance Directives discussion when she is here with patient. SO, Christy, per sister has a hard time processing all this and get very upset.  Will start bed search today.   Gabriel Cirri MSN RN CM  Transitions of Care Department [facility] [phone] Weekends Only     Expected Discharge Plan: Home/Self Care Barriers to Discharge: Continued Medical Work up  Expected Discharge Plan and Services       Living arrangements for the past 2 months: Single Family Home                                       Social Determinants of Health (SDOH) Interventions SDOH Screenings   Food Insecurity: No Food Insecurity (04/13/2023)  Housing: Low Risk  (04/13/2023)  Transportation Needs: No Transportation Needs (04/13/2023)  Utilities: Not At Risk (04/13/2023)  Alcohol Screen: Low Risk  (05/11/2022)  Depression (PHQ2-9): Low Risk  (05/11/2022)  Tobacco Use: Low Risk  (04/13/2023)     Readmission Risk Interventions    04/14/2023   10:31 AM  Readmission Risk Prevention Plan  Transportation Screening Complete  PCP or Specialist Appt within 3-5 Days Complete  HRI or Home Care Consult Complete  Social Work Consult for Recovery Care Planning/Counseling Complete  Palliative Care Screening Not Complete  Medication Review Oceanographer) Complete

## 2023-04-16 NOTE — [place] FL2 (Signed)
[place] MEDICAID FL2 LEVEL OF CARE FORM     IDENTIFICATION  Patient Name: Jason Hudson Birthdate: [birth year] Sex: male Admission Date (Current Location): 04/12/2023  [place] and [place] Number:  [address] [id number] R Facility and Address:  [facility], [address] [place], [place] [address]      Provider Number: [id number]  Attending Physician Name and Address:  Arnetha Courser, MD  Relative Name and Phone Number:  Delorise Shiner (Sister)  [phone] [facility]) (Says is POC/HCPOA)    Current Level of Care: Hospital Recommended Level of Care: Skilled Nursing Facility Prior Approval Number:    Date Approved/Denied: 04/16/23 PASRR Number: [id number]  Discharge Plan: SNF    Current Diagnoses: Patient Active Problem List   Diagnosis Date Noted   Sepsis due to pneumonia (HCC) 04/13/2023   Leukocytosis 04/13/2023   Chest pain 04/13/2023   Paroxysmal atrial fibrillation (HCC) 04/13/2023   Obesity, Class III, BMI 40-49.9 (morbid obesity) (HCC) 04/13/2023   CHF (congestive heart failure) (HCC) 04/12/2023   Gastric ulcer 05/18/2021   Benign hypertensive kidney disease with chronic kidney disease 02/15/2021   Proteinuria 02/15/2021   Secondary hyperparathyroidism of renal origin (HCC) 02/15/2021   Chronic kidney disease, stage 4 (severe) (HCC) 10/28/2020   Hypertension 10/28/2020   Low back pain due to bilateral sciatica 02/19/2020   Lumbar radiculopathy 02/18/2020   Encounter for general adult medical examination with abnormal findings 08/31/2019   Flu vaccine need 08/31/2019   Need for vaccination against Streptococcus pneumoniae using pneumococcal conjugate vaccine 13 08/31/2019   Dysuria 08/31/2019   Irritable bowel syndrome with constipation 05/29/2019   Chronic kidney disease due to type 2 diabetes mellitus (HCC) 05/22/2019   Type 2 diabetes mellitus with hyperglycemia (HCC) 03/17/2019   Clinically significant macular edema  03/01/2019   Lower extremity edema 12/08/2018   Edema of lower extremity 12/08/2018   Diabetic ulcer of calf (HCC) 11/10/2018   Restrictive lung disease 10/26/2018   Acute exacerbation of CHF (congestive heart failure) (HCC) 09/12/2018   Acute on chronic systolic congestive heart failure (HCC) 09/06/2018   AKI (acute kidney injury) (HCC) 09/06/2018   Acute kidney failure, unspecified (HCC) 09/06/2018   Moderate intermittent asthma without complication 08/30/2018   Coronary arteriosclerosis 08/30/2018   Diastolic dysfunction 08/30/2018   Hypercholesterolemia 08/30/2018   Obesity 08/30/2018   COPD exacerbation (HCC) 08/24/2018   Hyperlipidemia due to type 2 diabetes mellitus (HCC) 03/28/2018   Type 2 diabetes mellitus with chronic kidney disease, with long-term current use of insulin (HCC) 03/28/2018   Type 2 diabetes mellitus with stage 4 chronic kidney disease, with long-term current use of insulin (HCC) 03/28/2018   Type 2 diabetes mellitus (HCC) 03/28/2018   Essential hypertension 01/19/2018   Chronic obstructive lung disease (HCC) 12/15/2017   Gastroesophageal reflux disease 12/15/2017   Obstructive sleep apnea of adult 12/15/2017   Chronic obstructive pulmonary disease (HCC) 12/15/2017   Melena 11/05/2017   Congestive heart failure (HCC) 10/20/2017   Heart failure, unspecified (HCC) 10/20/2017   Pneumonia 07/27/2016    Orientation RESPIRATION BLADDER Height & Weight     Self, Time, Situation (Intermittent Confusion)  O2 (5L/[place] as of today 04/16/23) External catheter Weight: (!) 147.8 kg Height:  5\' 8"  (172.7 cm)  BEHAVIORAL SYMPTOMS/MOOD NEUROLOGICAL BOWEL NUTRITION STATUS      Continent Diet  AMBULATORY STATUS COMMUNICATION OF NEEDS Skin   Limited Assist (Genralized weakness/edema/High Fall Risk/deconditioning/dizziness) Verbally Other (Comment) (LEFT LE with edema/serous. Right LE scratches.)  Personal Care Assistance Level of Assistance   Bathing, Feeding, Dressing Bathing Assistance: Maximum assistance Feeding assistance: Limited assistance Dressing Assistance: Maximum assistance     Functional Limitations Info  Sight Sight Info: Impaired        SPECIAL CARE FACTORS FREQUENCY  PT (By licensed PT), OT (By licensed OT)     PT Frequency: 5x/week OT Frequency: 5/x/week            Contractures Contractures Info: Not present    Additional Factors Info  Code Status, Allergies Code Status Info: Full Code Allergies Info: Red Dye #40 (Allura Red), Atorvastatin, Atorvastatin Calcium           Current Medications (04/16/2023):  This is the current hospital active medication list Current Facility-Administered Medications  Medication Dose Route Frequency Provider Last Rate Last Admin   0.9 %  sodium chloride infusion  250 mL Intravenous PRN Mikey College T, MD       acetaminophen (TYLENOL) tablet 650 mg  650 mg Oral Q4H PRN Mikey College T, MD   650 mg at 04/15/23 0555   albuterol (PROVENTIL) (2.5 MG/3ML) 0.083% nebulizer solution 2.5 mg  2.5 mg Nebulization Q6H PRN Mikey College T, MD   2.5 mg at 04/14/23 0247   allopurinol (ZYLOPRIM) tablet 100 mg  100 mg Oral Daily Mikey College T, MD   100 mg at 04/16/23 6283   amiodarone (PACERONE) tablet 200 mg  200 mg Oral Daily Mikey College T, MD   200 mg at 04/16/23 1517   aspirin EC tablet 81 mg  81 mg Oral Daily Mikey College T, MD   81 mg at 04/16/23 0925   azithromycin (ZITHROMAX) 500 mg in sodium chloride 0.9 % 250 mL IVPB  500 mg Intravenous Daily Arnetha Courser, MD 250 mL/hr at 04/16/23 0931 500 mg at 04/16/23 0931   carvedilol (COREG) tablet 6.25 mg  6.25 mg Oral BID WC Mikey College T, MD   6.25 mg at 04/16/23 6160   ceFEPIme (MAXIPIME) 2 g in sodium chloride 0.9 % 100 mL IVPB  2 g Intravenous Q12H Bari Mantis A, RPH 200 mL/hr at 04/16/23 1053 2 g at 04/16/23 1053   cyclobenzaprine (FLEXERIL) tablet 10 mg  10 mg Oral TID PRN Arnetha Courser, MD   10 mg at 04/14/23 2133    doxazosin (CARDURA) tablet 4 mg  4 mg Oral QHS Mikey College T, MD   4 mg at 04/15/23 2232   enoxaparin (LOVENOX) injection 75 mg  0.5 mg/kg Subcutaneous Q24H Hallaji, Sheema M, RPH   75 mg at 04/16/23 0918   fluticasone (FLONASE) 50 MCG/ACT nasal spray 2 spray  2 spray Each Nare BID Mikey College T, MD   2 spray at 04/16/23 0919   fluticasone furoate-vilanterol (BREO ELLIPTA) 200-25 MCG/ACT 1 puff  1 puff Inhalation Daily Arnetha Courser, MD   1 puff at 04/16/23 0829   insulin aspart (novoLOG) injection 0-20 Units  0-20 Units Subcutaneous TID WC Arnetha Courser, MD   3 Units at 04/15/23 1236   insulin aspart (novoLOG) injection 0-5 Units  0-5 Units Subcutaneous QHS Mikey College T, MD   5 Units at 04/12/23 2214   insulin aspart (novoLOG) injection 5 Units  5 Units Subcutaneous TID WC Arnetha Courser, MD   5 Units at 04/15/23 1238   insulin glargine-yfgn (SEMGLEE) injection 35 Units  35 Units Subcutaneous BID Arnetha Courser, MD   35 Units at 04/16/23 0927   ipratropium (ATROVENT) nebulizer solution 0.5 mg  0.5 mg  Nebulization Q6H PRN Mikey College T, MD       isosorbide mononitrate (IMDUR) 24 hr tablet 30 mg  30 mg Oral Daily Mikey College T, MD   30 mg at 04/16/23 1610   levothyroxine (SYNTHROID) tablet 112 mcg  112 mcg Oral Q0600 Arnetha Courser, MD   112 mcg at 04/16/23 0542   lidocaine (LIDODERM) 5 % 1 patch  1 patch Transdermal Q24H Mikey College T, MD   1 patch at 04/15/23 1635   linagliptin (TRADJENTA) tablet 5 mg  5 mg Oral Daily Mikey College T, MD   5 mg at 04/16/23 9604   living well with diabetes book MISC   Does not apply Once Arnetha Courser, MD       loratadine (CLARITIN) tablet 10 mg  10 mg Oral Daily Mikey College T, MD   10 mg at 04/16/23 5409   LORazepam (ATIVAN) tablet 2 mg  2 mg Oral Q6H PRN Arnetha Courser, MD   2 mg at 04/15/23 0554   morphine (PF) 2 MG/ML injection 2 mg  2 mg Intravenous Q2H PRN Andris Baumann, MD   2 mg at 04/15/23 0230   ondansetron (ZOFRAN) injection 4 mg  4 mg Intravenous Q6H PRN  Mikey College T, MD       oxyCODONE (Oxy IR/ROXICODONE) immediate release tablet 10 mg  10 mg Oral Q6H PRN Arnetha Courser, MD   10 mg at 04/15/23 1847   sodium chloride flush (NS) 0.9 % injection 3 mL  3 mL Intravenous Q12H Mikey College T, MD   3 mL at 04/16/23 0919   sodium chloride flush (NS) 0.9 % injection 3 mL  3 mL Intravenous PRN Mikey College T, MD       torsemide [facility]) tablet 40 mg  40 mg Oral BID Wendee Beavers, NP   40 mg at 04/16/23 0918   traZODone (DESYREL) tablet 50 mg  50 mg Oral QHS PRN Emeline General, MD   50 mg at 04/14/23 2133     Discharge Medications: Please see discharge summary for a list of discharge medications.  Relevant Imaging Results:  Relevant Lab Results:   Additional Information SS# [id number]  Bing Quarry, RN

## 2023-04-17 DIAGNOSIS — D72829 Elevated white blood cell count, unspecified: Secondary | ICD-10-CM | POA: Diagnosis not present

## 2023-04-17 DIAGNOSIS — I5023 Acute on chronic systolic (congestive) heart failure: Secondary | ICD-10-CM | POA: Diagnosis not present

## 2023-04-17 DIAGNOSIS — R079 Chest pain, unspecified: Secondary | ICD-10-CM | POA: Diagnosis not present

## 2023-04-17 DIAGNOSIS — J189 Pneumonia, unspecified organism: Secondary | ICD-10-CM | POA: Diagnosis not present

## 2023-04-17 LAB — RENAL FUNCTION PANEL
Albumin: 3.4 g/dL — ABNORMAL LOW (ref 3.5–5.0)
Anion gap: 16 — ABNORMAL HIGH (ref 5–15)
BUN: 71 mg/dL — ABNORMAL HIGH (ref 8–23)
CO2: 30 mmol/L (ref 22–32)
Calcium: 10.1 mg/dL (ref 8.9–10.3)
Chloride: 88 mmol/L — ABNORMAL LOW (ref 98–111)
Creatinine, Ser: 3.2 mg/dL — ABNORMAL HIGH (ref 0.61–1.24)
GFR, Estimated: 20 mL/min — ABNORMAL LOW (ref >60)
Glucose, Bld: 102 mg/dL — ABNORMAL HIGH (ref 70–99)
Phosphorus: 4.4 mg/dL (ref 2.5–4.6)
Potassium: 4.1 mmol/L (ref 3.5–5.1)
Sodium: 134 mmol/L — ABNORMAL LOW (ref 135–145)

## 2023-04-17 LAB — GLUCOSE, CAPILLARY
Glucose-Capillary: 99 mg/dL (ref 70–99)
Glucose-Capillary: 169 mg/dL — ABNORMAL HIGH (ref 70–99)
Glucose-Capillary: 186 mg/dL — ABNORMAL HIGH (ref 70–99)
Glucose-Capillary: 170 mg/dL — ABNORMAL HIGH (ref 70–99)

## 2023-04-17 MED ORDER — INSULIN GLARGINE-YFGN 100 UNIT/ML ~~LOC~~ SOLN
30.0000 [IU] | Freq: Two times a day (BID) | SUBCUTANEOUS | Status: DC
  Administered 2023-04-17 – 2023-04-21 (×8): 30 [IU] via SUBCUTANEOUS
  Filled 2023-04-17 (×9): qty 0.3

## 2023-04-17 MED ORDER — TORSEMIDE 20 MG PO TABS
40.0000 mg | ORAL_TABLET | Freq: Two times a day (BID) | ORAL | Status: DC
  Administered 2023-04-18 – 2023-04-20 (×5): 40 mg via ORAL
  Filled 2023-04-17 (×5): qty 2

## 2023-04-17 MED ORDER — SODIUM CHLORIDE 0.9 % IV SOLN
2.0000 g | Freq: Two times a day (BID) | INTRAVENOUS | Status: AC
  Administered 2023-04-17 (×2): 2 g via INTRAVENOUS
  Filled 2023-04-17 (×2): qty 12.5

## 2023-04-17 NOTE — Progress Notes (Signed)
Entered room this morning at approximately 0650 to find patient in recliner hooked up to home concentrator which was "kind of working." Significant other in the bed sleeping hooked up to wall oxygen. Informed [address] and Donette Larry as well as AM and PM charge nurses of the situation.   Patient stated he wanted to leave AMA. IV and telemetry monitor removed, patient calling ride.  Patient now saying (1610) he wants to stay. Will verify.

## 2023-04-17 NOTE — Plan of Care (Addendum)
At 04:12am, pt's significant other who is on home oxygen stated that her oxygen tank is finished and asked this Clinical research associate for an oxygen tank. Writer explained to her that per policy we can not provide her with a tank because it is considered medication, to which she said with a raised voice "I have always been getting the tanks since he's been here". The Charge nurse was called and she gave both the pt and visitor the same explanation that we can not provide her the oxygen tank because she is not our patient and it is considered medication. Patient became upset and stated that "if she doesn't get the oxygen tank then I will not wear mine too". Pt was educated about the significance of him wearing the oxygen and the consequences of not doing so. He eventually agreed to put it on.  Problem: Education: Goal: Ability to describe self-care measures that may prevent or decrease complications (Diabetes Survival Skills Education) will improve Outcome: Progressing Goal: Individualized Educational Video(s) Outcome: Progressing   Problem: Coping: Goal: Ability to adjust to condition or change in health will improve Outcome: Progressing   Problem: Fluid Volume: Goal: Ability to maintain a balanced intake and output will improve Outcome: Progressing   Problem: Health Behavior/Discharge Planning: Goal: Ability to identify and utilize available resources and services will improve Outcome: Progressing Goal: Ability to manage health-related needs will improve Outcome: Progressing   Problem: Metabolic: Goal: Ability to maintain appropriate glucose levels will improve Outcome: Progressing   Problem: Nutritional: Goal: Maintenance of adequate nutrition will improve Outcome: Progressing Goal: Progress toward achieving an optimal weight will improve Outcome: Progressing   Problem: Skin Integrity: Goal: Risk for impaired skin integrity will decrease Outcome: Progressing   Problem: Tissue Perfusion: Goal:  Adequacy of tissue perfusion will improve Outcome: Progressing   Problem: Education: Goal: Knowledge of General Education information will improve Description: Including pain rating scale, medication(s)/side effects and non-pharmacologic comfort measures Outcome: Progressing   Problem: Health Behavior/Discharge Planning: Goal: Ability to manage health-related needs will improve Outcome: Progressing   Problem: Clinical Measurements: Goal: Ability to maintain clinical measurements within normal limits will improve Outcome: Progressing Goal: Will remain free from infection Outcome: Progressing Goal: Diagnostic test results will improve Outcome: Progressing Goal: Respiratory complications will improve Outcome: Progressing Goal: Cardiovascular complication will be avoided Outcome: Progressing   Problem: Activity: Goal: Risk for activity intolerance will decrease Outcome: Progressing   Problem: Nutrition: Goal: Adequate nutrition will be maintained Outcome: Progressing   Problem: Coping: Goal: Level of anxiety will decrease Outcome: Progressing   Problem: Elimination: Goal: Will not experience complications related to bowel motility Outcome: Progressing Goal: Will not experience complications related to urinary retention Outcome: Progressing   Problem: Pain Managment: Goal: General experience of comfort will improve Outcome: Progressing   Problem: Safety: Goal: Ability to remain free from injury will improve Outcome: Progressing   Problem: Skin Integrity: Goal: Risk for impaired skin integrity will decrease Outcome: Progressing

## 2023-04-17 NOTE — Progress Notes (Signed)
Progress Note   [name] ZOX:[id number] DOB: [birth year] DOA: 04/12/2023     5 DOS: the patient was seen and examined on 04/17/2023   Brief hospital course: Taken from H&P.  Jason Hudson is a 72 y.o. male with medical history significant of chronic HFrEF with LVEF 40-45%, PAF not on anticoagulation due to history of severe GI bleed, CKD stage IV, COPD, IDDM with insulin resistance, gout, hypothyroidism, BPH, morbid obesity, presented with increasing shortness of breath and leg swelling and chest pain.   On presentation patient was hemodynamically stable.  Chest x-ray with eventration of right hemidiaphragm with some adjacent atelectasis.  No pulmonary edema.  Labs pertinent for AKI with creatinine at 3.1, baseline seems to be around 2.1-2.4, leukocytosis at 16.3.  Troponin 20 EKG sinus rhythm, chronic ST changes on multiple leads.  Patient was given 80 mg of IV Lasix in ED and started on IV diuresis. Chest pain appears to be musculoskeletal so he was given a trial of lidocaine patch and Flexeril.  8/22: Overnight persistent bandlike chest pain, which increased with body movements and deep breathing, D-dimer was checked and found to be elevated, patient also has CKD stage IV, VQ scan was ordered by night on-call. Repeat troponin 43, A1c 11.3, slight worsening of renal function with BUN 48 and creatinine 3.21, worsening leukocytosis at 20.6.  BNP normal. Holding IV Lasix today, procalcitonin 0.36, COVID PCR negative.  Significant orthopnea and unable to lay down for any scan.  [facility] cardiology was consulted.  Starting him on ceftriaxone and Zithromax for CAP coverage.  8/23: Patient overnight became more dyspneic and febrile at 103.  ABG with mild hypercapnia and hypoxia, due to increased work of breathing he was placed on BiPAP. Respiratory viral panel and MRSA PCR negative.  Worsening leukocytosis and procalcitonin today.  Urine has not been sent to lab despite  multiple reminders. Ordered blood and sputum culture and switching ceftriaxone with cefepime.  UOP of 2450 recorded, creatinine worsening at 3.27.  Cardiology gave another dose of IV Lasix 80 mg yesterday. Patient now meeting sepsis criteria likely secondary to pneumonia, still unable to obtain further imaging.  Ordered some IV fluid, holding Lasix.  8/24: Vital stable, VQ scan negative for PE, renal ultrasound without any acute abnormality.  Chest CT with cardiomegaly, bibasilar atelectasis, diffuse thyroid enlargement with recommendation of nonemergent thyroid ultrasound which can be done by his PCP.  Labs with improving leukocytosis, sodium 133, small improvement to creatinine at 3.12 today.  Remained on 8 L of oxygen.  Home dose of torsemide was restarted today.  8/25: Renal functions and leukocytosis improving, on 5 L of oxygen, UOP of 2100.  PT and OT recommending SNF.  TOC to find placement.  8/26: Hemodynamically stable, will complete a 5-day course of antibiotic today.  Continue to have some intermittent crampy lower chest pain, PE and ACS has been ruled out, likely musculoskeletal.  Patient can try muscle relaxant and supportive care. [facility] authorization  Assessment and Plan: * Sepsis due to pneumonia [facility]) Patient now meeting sepsis criteria with fever, worsening leukocytosis, and tachypnea.  Likely secondary to pneumonia as there is no other source of infection BNP normal so less likely a CHF exacerbation. Significant leukocytosis and procalcitonin 0.36>>2.02.  Pleuritic chest pain-resolved V/Q negative for PE, venous Doppler negative for DVT. -Continue cefepime and Zithromax-will complete a 5-day today -Continue to monitor  Acute on chronic systolic congestive heart failure (HCC) Okay patient  was initially admitted with concern of acute on chronic HFrEF, prior echocardiogram done in 2020 with EF of 40 to 45%.  Repeat echo with normal EF,  indeterminate diastolic function. BNP normal and patient has chronic lower extremity edema with signs of chronic venous congestion and stasis.  Do not weigh himself regularly.  Chest x-ray without any pulmonary congestion.  Unable to check JVD due to body habitus. Received IV Lasix which resulted in worsening of creatinine. -Holding more IV diuresis -Restarting home torsemide as renal function now stable -Cardiology consult at his request  Leukocytosis Leukocytosis  Resolved.  Procalcitonin at 0.36>>2.02. Chest x-ray with questionable infiltrate/atelectasis. -Started him on ceftriaxone and Zithromax for CAP coverage, he became febrile with worsening leukocytosis, ceftriaxone is being switched to cefepime -Monitor CBC  Chest pain Improved Patient has atypical bandlike chest spasms, increased with any body movement and deep breathing.  Mildly positive troponin likely secondary to demand. -Echocardiogram with improved EF and no wall motion abnormalities  Essential hypertension Blood pressure within goal. -Continue home carvedilol, Cardura, Imdur.  Paroxysmal atrial fibrillation (HCC) Not on any anticoagulation due to history of GI bleed. -Continue amiodarone and aspirin  Chronic kidney disease, stage 4 (severe) (HCC) Likely AKI with history of CKD stage IV.  Creatinine slowly improving, at 2.88 today,   Baseline appears to be around 2.5.   Renal function started improving -Nephrology restarted home torsemide on 8/24 -Monitor renal function -Avoid nephrotoxins   Chronic obstructive pulmonary disease (HCC) No wheezing.  Slightly increased cough from his baseline. -Continue home bronchodilators -Patient likely is oxygen dependent, apparently had it at home but was not using regularly  Type 2 diabetes mellitus with chronic kidney disease, with long-term current use of insulin (HCC) Uncontrolled with hyperglycemia and A1c of 11.3.  CBG improved today  Patient is on significant amount  of insulin at home. -Continue Semglee to 35 units twice daily instead of 60 daily. -Decreasing mealtime coverage to 5 unit -Continue SSI  Obesity, Class III, BMI 40-49.9 (morbid obesity) (HCC) Estimated body mass index is 48.66 kg/m as calculated from the following:   Height as of this encounter: 5\' 8"  (1.727 m).   Weight as of this encounter: 145.2 kg.   This will complicate overall prognosis Encouraged weight loss  Obstructive sleep apnea of adult - BiPAP at night, patient is mostly noncompliant at home   Subjective: Patient is concerned about intermittent crampy lower chest pain which starts from right and sometimes goes around the whole chest like a band.  No associated shortness of breath.  Agrees to try rehab and [facility]  Physical Exam: Vitals:   04/16/23 2100 04/17/23 0436 04/17/23 0808 04/17/23 0820  BP: 131/80 (!) 146/74 (!) 115/54   Pulse: 70 79 80   Resp: 16 16 18    Temp:  99 F (37.2 C) 97.8 F (36.6 C)   TempSrc:  Oral Oral   SpO2:  96% (!) 87% 95%  Weight:      Height:       General.  Morbidly obese gentleman, in no acute distress. Pulmonary.  Lungs clear bilaterally, normal respiratory effort. CV.  Regular rate and rhythm, no JVD, rub or murmur. Abdomen.  Soft, nontender, nondistended, BS positive. CNS.  Alert and oriented .  No focal neurologic deficit. Extremities.  No edema, no cyanosis, pulses intact and symmetrical. Psychiatry.  Judgment and insight appears normal.   Data Reviewed: Prior data reviewed  Family Communication: Discussed with girlfriend at bedside  Disposition: Status is: Inpatient Remains inpatient appropriate because: Severity of illness  Planned Discharge Destination: Home  DVT prophylaxis.  Lovenox Time spent: 40 minutes  This record has been created using Conservation officer, historic buildings. Errors have been sought and corrected,but may not always be located. Such creation errors do not reflect on the  standard of care.   Author: Arnetha Courser, MD 04/17/2023 2:12 PM  For on call review [email or web address].

## 2023-04-17 NOTE — Progress Notes (Signed)
[facility]  ROUNDING NOTE   Subjective:   Jason Hudson is a 72 y.o. male with past medical history of chronic systolic heart failure, atrial fibrillation on anticoagulation and chronic kidney disease stage IV.  Patient presents to the emergency department complaining of shortness of breath with chest pain and lower extremity edema.  Patient has been admitted for Shortness of breath [R06.02] CHF (congestive heart failure) (HCC) [I50.9] Edema, unspecified type [R60.9] Chest pain, unspecified type [R07.9]  Patient is known to our practice and is followed outpatient by Dr. Wynelle Link.  Patient was last seen in office on April 04, 2023 for routine follow-up.   Patient seen sitting up in chair Alert and oriented Oxygen weaned to 3L  Lower extremity edema improved to baseline  Creatinine 3.20 Urine output in past 24 hours  Objective:  Vital signs in last 24 hours:  Temp:  [97.7 F (36.5 C)-99 F (37.2 C)] 97.8 F (36.6 C) (08/26 0808) Pulse Rate:  [70-80] 80 (08/26 0808) Resp:  [16-20] 18 (08/26 0808) BP: (106-146)/(54-80) 115/54 (08/26 0808) SpO2:  [87 %-100 %] 95 % (08/26 0820)  Weight change:  Filed Weights   04/13/23 0153 04/15/23 0500  Weight: (!) 145.2 kg (!) 147.8 kg    Intake/Output: I/O last 3 completed shifts: In: 450 [IV Piggyback:450] Out: 3950 [Urine:3950]   Intake/Output this shift:  No intake/output data recorded.  Physical Exam: General: NAD, sitting in chair  Head: Normocephalic, atraumatic. Moist oral mucosal membranes  Eyes: Anicteric  Neck: Supple  Lungs:  Clear to auscultation, HFNC  Heart: Regular rate and rhythm  Abdomen:  Soft, nontender, obese  Extremities: 1+  peripheral edema.  Neurologic: Nonfocal, moving all four extremities  Skin: No lesions  Access: None    Basic Metabolic Panel: Recent Labs  Lab 04/13/23 0148 04/14/23 0515 04/15/23 0433 04/16/23 0543 04/17/23 0432  NA 133* 136 133* 130* 134*  K  4.3 4.2 3.9 4.0 4.1  CL 89* 89* 93* 90* 88*  CO2 30 30 31 28 30   GLUCOSE 284* 129* 70 86 102*  BUN 48* 57* 56* 63* 71*  CREATININE 3.21* 3.27* 3.12* 2.88* 3.20*  CALCIUM 12.0* 11.3* 9.9 9.6 10.1  MG  --  1.6*  --   --   --   PHOS  --   --   --   --  4.4    Liver Function Tests: Recent Labs  Lab 04/13/23 0148 04/17/23 0432  AST 31  --   ALT 25  --   ALKPHOS 81  --   BILITOT 0.9  --   PROT 8.3*  --   ALBUMIN 3.9 3.4*   Recent Labs  Lab 04/13/23 0148  LIPASE 28   No results for input(s): "AMMONIA" in the last 168 hours.  CBC: Recent Labs  Lab 04/12/23 1305 04/13/23 0148 04/14/23 0515 04/15/23 0433 04/16/23 0543  WBC 16.3* 20.6* 31.1* 17.6* 14.3*  HGB 12.5* 13.6 11.7* 11.3* 11.9*  HCT 38.0* 40.8 36.0* 33.6* 37.2*  MCV 93.4 95.1 95.7 95.2 97.9  PLT 196 226 187 169 201    Cardiac Enzymes: Recent Labs  Lab 04/12/23 1535  CKTOTAL 82    BNP: Invalid input(s): "POCBNP"  CBG: Recent Labs  Lab 04/16/23 1210 04/16/23 1721 04/16/23 2101 04/17/23 0809 04/17/23 1135  GLUCAP 156* 141* 122* 99 169*    Microbiology: Results for orders placed or performed during the hospital encounter of 04/12/23  SARS Coronavirus 2 by RT PCR (hospital order, performed  in [facility] hospital lab) *cepheid single result test* Anterior Nasal Swab     Status: None   Collection Time: 04/13/23 10:53 AM   Specimen: Anterior Nasal Swab  Result Value Ref Range Status   SARS Coronavirus 2 by RT PCR NEGATIVE NEGATIVE Final    Comment: (NOTE) SARS-CoV-2 target nucleic acids are NOT DETECTED.  The SARS-CoV-2 RNA is generally detectable in upper and lower respiratory specimens during the acute phase of infection. The lowest concentration of SARS-CoV-2 viral copies this assay can detect is 250 copies / mL. A negative result does not preclude SARS-CoV-2 infection and should not be used as the sole basis for treatment or other patient management decisions.  A negative result may occur  with improper specimen collection / handling, submission of specimen other than nasopharyngeal swab, presence of viral mutation(s) within the areas targeted by this assay, and inadequate number of viral copies (<250 copies / mL). A negative result must be combined with clinical observations, patient history, and epidemiological information.  Fact Sheet for Patients:   RoadLapTop.co.za  Fact Sheet for Healthcare Providers: http://kim-miller.com/  This test is not yet approved or  cleared by the [place] FDA and has been authorized for detection and/or diagnosis of SARS-CoV-2 by FDA under an Emergency Use Authorization (EUA).  This EUA will remain in effect (meaning this test can be used) for the duration of the COVID-19 declaration under Section 564(b)(1) of the Act, 21 U.S.C. section 360bbb-3(b)(1), unless the authorization is terminated or revoked sooner.  Performed at [facility], [address] [place], [place] [address]   Respiratory (~20 pathogens) panel by PCR     Status: None   Collection Time: 04/14/23  2:30 AM   Specimen: Nasopharyngeal Swab; Respiratory  Result Value Ref Range Status   Adenovirus NOT DETECTED NOT DETECTED Final   Coronavirus 229E NOT DETECTED NOT DETECTED Final    Comment: (NOTE) The Coronavirus on the Respiratory Panel, DOES NOT test for the novel  Coronavirus (2019 nCoV)    Coronavirus HKU1 NOT DETECTED NOT DETECTED Final   Coronavirus NL63 NOT DETECTED NOT DETECTED Final   Coronavirus OC43 NOT DETECTED NOT DETECTED Final   Metapneumovirus NOT DETECTED NOT DETECTED Final   Rhinovirus / Enterovirus NOT DETECTED NOT DETECTED Final   Influenza A NOT DETECTED NOT DETECTED Final   Influenza B NOT DETECTED NOT DETECTED Final   Parainfluenza Virus 1 NOT DETECTED NOT DETECTED Final   Parainfluenza Virus 2 NOT DETECTED NOT DETECTED Final   Parainfluenza Virus 3 NOT DETECTED NOT DETECTED Final    Parainfluenza Virus 4 NOT DETECTED NOT DETECTED Final   Respiratory Syncytial Virus NOT DETECTED NOT DETECTED Final   Bordetella pertussis NOT DETECTED NOT DETECTED Final   Bordetella Parapertussis NOT DETECTED NOT DETECTED Final   Chlamydophila pneumoniae NOT DETECTED NOT DETECTED Final   Mycoplasma pneumoniae NOT DETECTED NOT DETECTED Final    Comment: Performed at [facility], [address] [place], [place] [address]  MRSA Next Gen by PCR, Nasal     Status: None   Collection Time: 04/14/23  2:30 AM   Specimen: Nasal Mucosa; Nasal Swab  Result Value Ref Range Status   MRSA by PCR Next Gen NOT DETECTED NOT DETECTED Final    Comment: (NOTE) The GeneXpert MRSA Assay (FDA approved for NASAL specimens only), is one component of a comprehensive MRSA colonization surveillance program. It is not intended to diagnose MRSA infection nor to guide or monitor treatment for MRSA  infections. Test performance is not FDA approved in patients less than 36 years old. Performed at [facility], [address] [place], [place] [address]   Culture, blood (Routine X 2) w Reflex to ID Panel     Status: None (Preliminary result)   Collection Time: 04/14/23  9:25 AM   Specimen: BLOOD  Result Value Ref Range Status   Specimen Description BLOOD BLOOD RIGHT HAND  Final   Special Requests   Final    BOTTLES DRAWN AEROBIC AND ANAEROBIC Blood Culture adequate volume   Culture   Final    NO GROWTH 3 DAYS Performed at [facility], [address] [place], [place] [address]    Report Status PENDING  Incomplete  Culture, blood (Routine X 2) w Reflex to ID Panel     Status: None (Preliminary result)   Collection Time: 04/14/23  9:25 AM   Specimen: BLOOD  Result Value Ref Range Status   Specimen Description BLOOD BLOOD LEFT HAND  Final   Special Requests   Final    BOTTLES DRAWN AEROBIC AND ANAEROBIC Blood Culture adequate volume   Culture   Final    NO GROWTH 3 DAYS Performed  at [facility], [address] [place], [place] [address]    Report Status PENDING  Incomplete    Coagulation Studies: Recent Labs    04/15/23 0433  LABPROT 14.6  INR 1.1    Urinalysis: No results for input(s): "COLORURINE", "LABSPEC", "PHURINE", "GLUCOSEU", "HGBUR", "BILIRUBINUR", "KETONESUR", "PROTEINUR", "UROBILINOGEN", "NITRITE", "LEUKOCYTESUR" in the last 72 hours.  Invalid input(s): "APPERANCEUR"    Imaging: No results found.   Medications:    sodium chloride     ceFEPime (MAXIPIME) IV      allopurinol  100 mg Oral Daily   amiodarone  200 mg Oral Daily   aspirin EC  81 mg Oral Daily   carvedilol  6.25 mg Oral BID WC   doxazosin  4 mg Oral QHS   enoxaparin (LOVENOX) injection  0.5 mg/kg Subcutaneous Q24H   fluticasone  2 spray Each Nare BID   fluticasone furoate-vilanterol  1 puff Inhalation Daily   insulin aspart  0-20 Units Subcutaneous TID WC   insulin aspart  0-5 Units Subcutaneous QHS   insulin aspart  5 Units Subcutaneous TID WC   insulin glargine-yfgn  30 Units Subcutaneous BID   isosorbide mononitrate  30 mg Oral Daily   levothyroxine  112 mcg Oral Q0600   lidocaine  1 patch Transdermal Q24H   linagliptin  5 mg Oral Daily   living well with diabetes book   Does not apply Once   loratadine  10 mg Oral Daily   sodium chloride flush  3 mL Intravenous Q12H   [START ON 04/18/2023] torsemide  40 mg Oral BID   sodium chloride, acetaminophen, albuterol, cyclobenzaprine, ipratropium, LORazepam, morphine injection, ondansetron (ZOFRAN) IV, oxyCODONE, sodium chloride flush, traZODone  Assessment/ Plan:  Mr. Jason Hudson is a 72 y.o.  male with past medical history of chronic systolic heart failure, atrial fibrillation on anticoagulation and chronic kidney disease stage IV.  Patient presents to the emergency department complaining of shortness of breath with chest pain and lower extremity edema.  Patient has been admitted for Shortness of  breath [R06.02] CHF (congestive heart failure) (HCC) [I50.9] Edema, unspecified type [R60.9] Chest pain, unspecified type [R07.9]   Acute Kidney Injury on chronic kidney disease stage IV with baseline creatinine 2.5 and GFR of 27 on 03/20/23.  Acute  kidney injury secondary to fluid overload and aggressive diuresis Chronic kidney disease is secondary to poor diabetic control Awaiting renal ultrasound.  No IV contrast exposure.  Diuretics held due to  increased creatinine.   Creatinine slightly elevated, may represent new baseline for this patient. Will continue current treatments and monitor.    Lab Results  Component Value Date   CREATININE 3.20 (H) 04/17/2023   CREATININE 2.88 (H) 04/16/2023   CREATININE 3.12 (H) 04/15/2023    Intake/Output Summary (Last 24 hours) at 04/17/2023 1157 Last data filed at 04/16/2023 1721 Gross per 24 hour  Intake --  Output 850 ml  Net -850 ml    2.  Acute on chronic diastolic heart failure.  Echo completed this admission shows EF 55 to 60%.  Continue Torsemide as prescribed  3. Anemia of chronic kidney disease Lab Results  Component Value Date   HGB 11.9 (L) 04/16/2023    Hemoglobin within desired range.  4. 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 Home regimen includes [place]. Most recent hemoglobin A1c is 11.3 on 04/13/23.   5. Hyponatremia likely secondary to volume status. Placed on fluid restriction and continue diuresis. Sodium 134 today    LOS: 5 Jason Hudson 8/26/202411:57 AM

## 2023-04-17 NOTE — Progress Notes (Signed)
[facility] HF Stewardship  PCP: Enid Baas, MD  PCP-Cardiologist: None  HPI: Jason Hudson is a 72 y.o. male with chronic HFrEF with LVEF previously 40-45%, PAF not on anticoagulation due to history of severe GI bleed, CKD stage IV, COPD, T2DM with insulin resistance, gout, hypothyroidism, BPH, morbid obesity  who presented with shortness of breath, leg swelling and chest pain. Underwent LHC in 2019 showing 70% stenosis of Ost 1st Sept, 50% of Ost 2nd Mrg, and 100% Mid RCA stenosis with collaterals treated with medical management. LVEDP at that time was ~17. TTE in 2019 showed LVEF of 50-55%, which decreased in 2020 to 40-45%. MRI in 02/2023 showed improvement in LVEF to 66%. Echocardiogram 04/13/23 showed LVEF of 55-60%.Troponin on presentation was minimally elevated and BNP was WNL.Symptoms due to pneumonia, which are being treated with azithromycin and cefepime.  Pertinent Lab Values: Creatinine, Ser  Date Value Ref Range Status  04/17/2023 3.20 (H) 0.61 - 1.24 mg/dL Final   BUN  Date Value Ref Range Status  04/17/2023 71 (H) 8 - 23 mg/dL Final  16/05/9603 35 (H) 8 - 27 mg/dL Final   Potassium  Date Value Ref Range Status  04/17/2023 4.1 3.5 - 5.1 mmol/L Final   Sodium  Date Value Ref Range Status  04/17/2023 134 (L) 135 - 145 mmol/L Final  10/21/2020 137 134 - 144 mmol/L Final   B Natriuretic Peptide  Date Value Ref Range Status  04/12/2023 89.7 0.0 - 100.0 pg/mL Final    Comment:    Performed at [facility], [address] [place], [place] [address]   Magnesium  Date Value Ref Range Status  04/14/2023 1.6 (L) 1.7 - 2.4 mg/dL Final    Comment:    Performed at [facility], [address] [place], [place] [address]   Hgb A1c MFr Bld  Date Value Ref Range Status  04/13/2023 11.3 (H) 4.8 - 5.6 % Final    Comment:    (NOTE) Pre diabetes:          5.7%-6.4%  Diabetes:              >6.4%  Glycemic control for   <7.0% adults with  diabetes    TSH  Date Value Ref Range Status  10/21/2020 16.300 (H) 0.450 - 4.500 uIU/mL Final    Vital Signs: Temp:  [97.7 F (36.5 C)-99 F (37.2 C)] 97.8 F (36.6 C) (08/26 0808) Pulse Rate:  [70-80] 80 (08/26 0808) Resp:  [16-20] 18 (08/26 0808) BP: (106-146)/(54-80) 115/54 (08/26 0808) SpO2:  [87 %-100 %] 95 % (08/26 0820)   Intake/Output Summary (Last 24 hours) at 04/17/2023 7829 Last data filed at 04/16/2023 1721 Gross per 24 hour  Intake 450 ml  Output 1850 ml  Net -1400 ml    Current Inpatient Medications:  Loop Diuretic: Torsemide 40 mg BID Beta Blocker: Carvedilol 6.25 mg BID           Other vasoactive medications include isosorbide mononitrate 30 mg daily  Prior to admission HF Medications:  Carvedilol 6.25 mg BID Torsemide 80 mg daily Metolazone 2.5 mg as needed  Assessment: 1. Chronic heart failure (LVEF 40-45% in 2020, now improved to 55-60%), due to NICM.  -Remains on 3L O2. Symptoms are not due to HF given normal BNP. -Creatinine and BUN simultaneously increased. Good urine output. May be dry. Can consider reducing diuretic. -BP WNL this AM, but as high as 146 mmHg in the last 24 hours. Creatinine initially improved, but  now trending back up to 3.2.  -A1c of 11.3. Can consider SGLT2i when glucose is controlled to prevent UTI and pneumonia is treated to prevent euglycemic DKA.  Plan: 1) Medication changes recommended at this time: -Consider holding torsemide x 1 day, then decreasing to 40 mg daily  2) Patient assistance: Sherryll Burger copay is [id number] (in Medicare coverage gap) -Farxiga copay $139.63 (in Medicare coverage gap)  3) Education: -To be completed prior to discharge.  Medication Assistance / Insurance Benefits Check:  Does the patient have prescription insurance?    Type of insurance plan:   Does the patient qualify for medication assistance through manufacturers or grants? Pending. May qualify depending on income.  Outpatient  Pharmacy:  Prior to admission outpatient pharmacy: Pending     Thank you for involving pharmacy in this patient's care.  Enos Fling, PharmD, BCPS Phone - [phone] Clinical Pharmacist 04/17/2023 8:33 AM

## 2023-04-17 NOTE — Progress Notes (Signed)
Physical Therapy Treatment Patient Details Name: Jason Hudson MRN: [id number] DOB: 04/20/1951 Today's Date: 04/17/2023   History of Present Illness Jason Hudson is a 71 y.o. male with a history of hypertension, diabetes, CKD, CAD, dismal atrial fibrillation, CHF, amyloid cyst, and COPD who presents to hospital with sepsis due to pneumonia.    PT Comments  Pt resting in recliner upon PT arrival; agreeable to therapy.  Pt's SpO2 sats (on 3 L O2 via nasal cannula) 92% or greater during sessions activities.  During session pt modified independent with transfer from recliner and CGA to ambulate 40 feet with bariatric RW use.  Limited ambulation distance d/t SOB and fatigue.  Will continue to focus on strengthening and progressive functional mobility per pt tolerance.    If plan is discharge home, recommend the following: A little help with walking and/or transfers;A lot of help with bathing/dressing/bathroom;Assist for transportation;Assistance with cooking/housework   Can travel by private vehicle     No  Equipment Recommendations  Rolling walker (2 wheels)    Recommendations for Other Services       Precautions / Restrictions Precautions Precautions: Fall Restrictions Weight Bearing Restrictions: No     Mobility  Bed Mobility               General bed mobility comments: Deferred (pt in recliner beginning/end of session)    Transfers Overall transfer level: Modified independent Equipment used: Rolling walker (2 wheels) (Bariatric) Transfers: Sit to/from Stand Sit to Stand: Modified independent (Device/Increase time)           General transfer comment: mild increased effort to stand from recliner; steady with walker use    Ambulation/Gait Ambulation/Gait assistance: Contact guard assist Gait Distance (Feet): 40 Feet Assistive device: Rolling walker (2 wheels) (bariatric) Gait Pattern/deviations: Decreased step length - right, Decreased step  length - left Gait velocity: decreased     General Gait Details: wider BOS; limited d/t SOB   Stairs             Wheelchair Mobility     Tilt Bed    Modified Rankin (Stroke Patients Only)       Balance Overall balance assessment: Needs assistance Sitting-balance support: No upper extremity supported, Feet supported Sitting balance-Leahy Scale: Good Sitting balance - Comments: steady reaching within BOS   Standing balance support: Bilateral upper extremity supported, During functional activity, Reliant on assistive device for balance Standing balance-Leahy Scale: Good Standing balance comment: steady ambulating with walker use                            Cognition Arousal: Alert Behavior During Therapy: WFL for tasks assessed/performed Overall Cognitive Status: Within Functional Limits for tasks assessed                                          Exercises      General Comments        Pertinent Vitals/Pain Pain Assessment Pain Assessment: 0-10 Pain Score: 7  Pain Location: band like pain around chest (pt reports same pain he came in with) (also intermittent spasms in B shoulders and R hip) Pain Descriptors / Indicators: Aching, Constant, Spasm Pain Intervention(s): Limited activity within patient's tolerance, Monitored during session, Repositioned, Patient requesting pain meds-RN notified HR 74-83 bpm during sessions activities.    Home Living  Prior Function            PT Goals (current goals can now be found in the care plan section) Acute Rehab PT Goals Patient Stated Goal: To improve breathing PT Goal Formulation: With patient Time For Goal Achievement: 04/28/23 Potential to Achieve Goals: Good Progress towards PT goals: Progressing toward goals    Frequency    Min 1X/week      PT Plan      Co-evaluation              AM-PAC PT "6 Clicks" Mobility   Outcome  Measure  Help needed turning from your back to your side while in a flat bed without using bedrails?: A Little Help needed moving from lying on your back to sitting on the side of a flat bed without using bedrails?: A Little Help needed moving to and from a bed to a chair (including a wheelchair)?: A Little Help needed standing up from a chair using your arms (e.g., wheelchair or bedside chair)?: A Little Help needed to walk in hospital room?: A Little Help needed climbing 3-5 steps with a railing? : Total 6 Click Score: 16    End of Session Equipment Utilized During Treatment: Gait belt;Oxygen (3 L via nasal cannula) Activity Tolerance: Patient limited by fatigue;Other (comment) (limited d/t SOB) Patient left: in chair;with call bell/phone within reach Nurse Communication: Mobility status;Precautions (no chair alarm in place (pt sitting in chair without chair alarm upon PT arrival)) PT Visit Diagnosis: Difficulty in walking, not elsewhere classified (R26.2)     Time: 1610-9604 PT Time Calculation (min) (ACUTE ONLY): 12 min  Charges:    $Therapeutic Activity: 8-22 mins PT General Charges $$ ACUTE PT VISIT: 1 Visit                     Hendricks Limes, PT 04/17/23, 5:33 PM

## 2023-04-17 NOTE — TOC Progression Note (Signed)
Transition of Care [facility]) - Progression Note    Patient Details  Name: Tsubasa Nistler MRN: [id number] Date of Birth: Oct 07, 1950  [facility]) CM/SW Contact  Chapman Fitch, RN Phone Number: 04/17/2023, 10:39 AM  Clinical Narrative:     Call placed to sister Luann to notify her of bed offers Patient A&O x4 today.  Bed offer of [facility] presented.  Patient declines At this time this is his only bed offer Message left for Tiffany at [facility] to review Bed search sent to [facility] and [facility]   Expected Discharge Plan: Home/Self Care Barriers to Discharge: Continued Medical Work up  Expected Discharge Plan and Services       Living arrangements for the past 2 months: Single Family Home                                       Social Determinants of Health (SDOH) Interventions SDOH Screenings   Food Insecurity: No Food Insecurity (04/13/2023)  Housing: Low Risk  (04/13/2023)  Transportation Needs: No Transportation Needs (04/13/2023)  Utilities: Not At Risk (04/13/2023)  Alcohol Screen: Low Risk  (05/11/2022)  Depression (PHQ2-9): Low Risk  (05/11/2022)  Tobacco Use: Low Risk  (04/13/2023)    Readmission Risk Interventions    04/14/2023   10:31 AM  Readmission Risk Prevention Plan  Transportation Screening Complete  PCP or Specialist Appt within 3-5 Days Complete  HRI or Home Care Consult Complete  Social Work Consult for Recovery Care Planning/Counseling Complete  Palliative Care Screening Not Complete  Medication Review Oceanographer) Complete

## 2023-04-17 NOTE — Care Management Important Message (Signed)
Important Message  Patient Details  Name: Jason Hudson MRN: [id number] Date of Birth: 17-Jun-1951   Medicare Important Message Given:  Yes     [name] 04/17/2023, 10:43 AM

## 2023-04-17 NOTE — Progress Notes (Signed)
Patient on the phone with "close friend." Patient continues to tell friend "doctor just said I have to get out of here. They said I have to." This RN voiced to patient reminder he has the option to go to SNF instead of home, that we are not "kicking him out to the streets" as he and friend have discussed during phone call.  TOC entered room at this time and re-explained everything to patient; thoroughly.

## 2023-04-17 NOTE — TOC Progression Note (Signed)
Transition of Care [facility]) - Progression Note    Patient Details  Name: Jason Hudson MRN: [id number] Date of Birth: [birth year]  [facility] of Care [facility]) CM/SW Contact  Chapman Fitch, RN Phone Number: 04/17/2023, 11:14 AM  Clinical Narrative:     [facility] is unable to offer a bed Patient declines for bed search to bed extended.  Patient accepts bed at [facility] .  Sister Leotis Shames updated with patient's permission  Patient requesting EMS transport at discharge Spoke with Tammy at [facility] and auth started for SNF and EMS  Tammy at [facility] notified   Expected Discharge Plan: Home/Self Care Barriers to Discharge: Continued Medical Work up  Expected Discharge Plan and Services       Living arrangements for the past 2 months: Single Family Home                                       Social Determinants of Health (SDOH) Interventions SDOH Screenings   Food Insecurity: No Food Insecurity (04/13/2023)  Housing: Low Risk  (04/13/2023)  Transportation Needs: No Transportation Needs (04/13/2023)  Utilities: Not At Risk (04/13/2023)  Alcohol Screen: Low Risk  (05/11/2022)  Depression (PHQ2-9): Low Risk  (05/11/2022)  Tobacco Use: Low Risk  (04/13/2023)    Readmission Risk Interventions    04/14/2023   10:31 AM  Readmission Risk Prevention Plan  Transportation Screening Complete  PCP or Specialist Appt within 3-5 Days Complete  HRI or Home Care Consult Complete  Social Work Consult for Recovery Care Planning/Counseling Complete  Palliative Care Screening Not Complete  Medication Review Oceanographer) Complete

## 2023-04-18 DIAGNOSIS — J9811 Atelectasis: Secondary | ICD-10-CM

## 2023-04-18 DIAGNOSIS — I890 Lymphedema, not elsewhere classified: Secondary | ICD-10-CM | POA: Diagnosis not present

## 2023-04-18 DIAGNOSIS — J189 Pneumonia, unspecified organism: Secondary | ICD-10-CM | POA: Diagnosis not present

## 2023-04-18 DIAGNOSIS — R079 Chest pain, unspecified: Secondary | ICD-10-CM | POA: Diagnosis not present

## 2023-04-18 DIAGNOSIS — I5023 Acute on chronic systolic (congestive) heart failure: Secondary | ICD-10-CM | POA: Diagnosis not present

## 2023-04-18 DIAGNOSIS — I509 Heart failure, unspecified: Secondary | ICD-10-CM | POA: Diagnosis not present

## 2023-04-18 DIAGNOSIS — D72829 Elevated white blood cell count, unspecified: Secondary | ICD-10-CM | POA: Diagnosis not present

## 2023-04-18 DIAGNOSIS — N189 Chronic kidney disease, unspecified: Secondary | ICD-10-CM | POA: Diagnosis not present

## 2023-04-18 DIAGNOSIS — E662 Morbid (severe) obesity with alveolar hypoventilation: Secondary | ICD-10-CM

## 2023-04-18 LAB — GLUCOSE, CAPILLARY
Glucose-Capillary: 154 mg/dL — ABNORMAL HIGH (ref 70–99)
Glucose-Capillary: 155 mg/dL — ABNORMAL HIGH (ref 70–99)
Glucose-Capillary: 166 mg/dL — ABNORMAL HIGH (ref 70–99)
Glucose-Capillary: 167 mg/dL — ABNORMAL HIGH (ref 70–99)

## 2023-04-18 LAB — BLOOD CULTURE ID PANEL (REFLEXED) - BCID2

## 2023-04-18 LAB — BASIC METABOLIC PANEL
Anion gap: 11 (ref 5–15)
BUN: 90 mg/dL — ABNORMAL HIGH (ref 8–23)
CO2: 30 mmol/L (ref 22–32)
Calcium: 9.3 mg/dL (ref 8.9–10.3)
Chloride: 90 mmol/L — ABNORMAL LOW (ref 98–111)
Creatinine, Ser: 3.09 mg/dL — ABNORMAL HIGH (ref 0.61–1.24)
GFR, Estimated: 21 mL/min — ABNORMAL LOW (ref >60)
Glucose, Bld: 183 mg/dL — ABNORMAL HIGH (ref 70–99)
Potassium: 3.9 mmol/L (ref 3.5–5.1)
Sodium: 131 mmol/L — ABNORMAL LOW (ref 135–145)

## 2023-04-18 MED ORDER — CEFAZOLIN SODIUM-DEXTROSE 2-4 GM/100ML-% IV SOLN
2.0000 g | Freq: Two times a day (BID) | INTRAVENOUS | Status: DC
  Administered 2023-04-18 – 2023-04-20 (×5): 2 g via INTRAVENOUS
  Filled 2023-04-18 (×6): qty 100

## 2023-04-18 MED ORDER — SPIRONOLACTONE 25 MG PO TABS
25.0000 mg | ORAL_TABLET | Freq: Every day | ORAL | Status: DC
  Administered 2023-04-18 – 2023-04-21 (×4): 25 mg via ORAL
  Filled 2023-04-18 (×4): qty 1

## 2023-04-18 NOTE — Progress Notes (Signed)
[facility] HF Stewardship  PCP: Enid Baas, MD  PCP-Cardiologist: None  HPI: Jason Hudson is a 72 y.o. male with chronic HFrEF with LVEF previously 40-45%, PAF not on anticoagulation due to history of severe GI bleed, CKD stage IV, COPD, T2DM with insulin resistance, gout, hypothyroidism, BPH, morbid obesity  who presented with shortness of breath, leg swelling and chest pain. Underwent LHC in 2019 showing 70% stenosis of Ost 1st Sept, 50% of Ost 2nd Mrg, and 100% Mid RCA stenosis with collaterals treated with medical management. LVEDP at that time was ~17. TTE in 2019 showed LVEF of 50-55%, which decreased in 2020 to 40-45%. MRI in 02/2023 showed improvement in LVEF to 66%. Echocardiogram 04/13/23 showed LVEF of 55-60%.Troponin on presentation was minimally elevated and BNP was WNL.Symptoms due to pneumonia, which are being treated with azithromycin and cefepime.  Pertinent Lab Values: Creatinine, Ser  Date Value Ref Range Status  04/17/2023 3.20 (H) 0.61 - 1.24 mg/dL Final   BUN  Date Value Ref Range Status  04/17/2023 71 (H) 8 - 23 mg/dL Final  16/05/9603 35 (H) 8 - 27 mg/dL Final   Potassium  Date Value Ref Range Status  04/17/2023 4.1 3.5 - 5.1 mmol/L Final   Sodium  Date Value Ref Range Status  04/17/2023 134 (L) 135 - 145 mmol/L Final  10/21/2020 137 134 - 144 mmol/L Final   B Natriuretic Peptide  Date Value Ref Range Status  04/12/2023 89.7 0.0 - 100.0 pg/mL Final    Comment:    Performed at [facility], [address] [place], [place] [address]   Magnesium  Date Value Ref Range Status  04/14/2023 1.6 (L) 1.7 - 2.4 mg/dL Final    Comment:    Performed at [facility], [address] [place], [place] [address]   Hgb A1c MFr Bld  Date Value Ref Range Status  04/13/2023 11.3 (H) 4.8 - 5.6 % Final    Comment:    (NOTE) Pre diabetes:          5.7%-6.4%  Diabetes:              >6.4%  Glycemic control for   <7.0% adults with  diabetes    TSH  Date Value Ref Range Status  10/21/2020 16.300 (H) 0.450 - 4.500 uIU/mL Final    Vital Signs: Temp:  [97.6 F (36.4 C)-98.1 F (36.7 C)] 97.6 F (36.4 C) (08/27 0745) Pulse Rate:  [69-80] 73 (08/27 0745) Cardiac Rhythm: Heart block;Normal sinus rhythm (08/26 1944) Resp:  [18-20] 18 (08/27 0745) BP: (115-140)/(54-77) 140/77 (08/27 0745) SpO2:  [87 %-99 %] 99 % (08/27 0745)   Intake/Output Summary (Last 24 hours) at 04/18/2023 0802 Last data filed at 04/17/2023 2043 Gross per 24 hour  Intake 99 ml  Output 2775 ml  Net -2676 ml    Current Inpatient Medications:  Loop Diuretic: Torsemide 40 mg BID Beta Blocker: Carvedilol 6.25 mg BID           Other vasoactive medications include isosorbide mononitrate 30 mg daily  Prior to admission HF Medications:  Carvedilol 6.25 mg BID Torsemide 80 mg daily Metolazone 2.5 mg as needed  Assessment: 1. Chronic heart failure (LVEF 40-45% in 2020, now improved to 55-60%), due to NICM.  -Remains on 3L O2. Symptoms are not due to HF given normal BNP. -Creatinine and BUN simultaneously increased. Good urine output. Creatinine improved after holding a dose of diuretics yesterday. -BP WNL this AM, but as high as 140  mmHg in the last 24 hours. Hesitant to increase carvedilol given COPD. Hesistant to add back ARB/ARNI given renal function. Can consider afterload reduction with hydralazine if BP remains elevated. -A1c of 11.3. Can consider SGLT2i when glucose is controlled to prevent UTI and pneumonia is treated to prevent euglycemic DKA.  Plan: 1) Medication changes recommended at this time: -Consider reducing torsemide to once daily   2) Patient assistance: Sherryll Burger copay is [phone] (in Medicare coverage gap) -Farxiga copay $139.63 (in Medicare coverage gap)  3) Education: -To be completed prior to discharge.  Medication Assistance / Insurance Benefits Check:  Does the patient have prescription insurance?    Type  of insurance plan:   Does the patient qualify for medication assistance through manufacturers or grants? Pending. May qualify depending on income.  Outpatient Pharmacy:  Prior to admission outpatient pharmacy: Pending     Thank you for involving pharmacy in this patient's care.  Enos Fling, PharmD, BCPS Phone - [phone] Clinical Pharmacist 04/18/2023 8:02 AM

## 2023-04-18 NOTE — Plan of Care (Signed)

## 2023-04-18 NOTE — Progress Notes (Signed)
[facility]  ROUNDING NOTE   Subjective:   Jason Hudson is a 72 y.o. male with past medical history of chronic systolic heart failure, atrial fibrillation on anticoagulation and chronic kidney disease stage IV.  Patient presents to the emergency department complaining of shortness of breath with chest pain and lower extremity edema.  Patient has been admitted for Shortness of breath [R06.02] CHF (congestive heart failure) (HCC) [I50.9] Edema, unspecified type [R60.9] Chest pain, unspecified type [R07.9]  Patient is known to our practice and is followed outpatient by Dr. Wynelle Link.  Patient was last seen in office on April 04, 2023 for routine follow-up.   Patient seen sitting up in chair, no family present Alert and oriented Remains on 3L  Lower extremity edema improved to baseline  Creatinine 3.09 Urine output in past 24 hours  Objective:  Vital signs in last 24 hours:  Temp:  [97.6 F (36.4 C)-98.1 F (36.7 C)] 97.6 F (36.4 C) (08/27 0745) Pulse Rate:  [69-76] 73 (08/27 0745) Resp:  [18-20] 18 (08/27 0745) BP: (123-140)/(64-77) 140/77 (08/27 0745) SpO2:  [95 %-99 %] 99 % (08/27 0745)  Weight change:  Filed Weights   04/13/23 0153 04/15/23 0500  Weight: (!) 145.2 kg (!) 147.8 kg    Intake/Output: I/O last 3 completed shifts: In: 579 [P.O.:480; IV Piggyback:99] Out: 2775 [Urine:2775]   Intake/Output this shift:  Total I/O In: 240 [P.O.:240] Out: -   Physical Exam: General: NAD, sitting in chair  Head: Normocephalic, atraumatic. Moist oral mucosal membranes  Eyes: Anicteric  Neck: Supple  Lungs:  Clear to auscultation, HFNC  Heart: Regular rate and rhythm  Abdomen:  Soft, nontender, obese  Extremities: 1+  peripheral edema.  Neurologic: Nonfocal, moving all four extremities  Skin: No lesions  Access: None    Basic Metabolic Panel: Recent Labs  Lab 04/14/23 0515 04/15/23 0433 04/16/23 0543 04/17/23 0432 04/18/23 0939   NA 136 133* 130* 134* 131*  K 4.2 3.9 4.0 4.1 3.9  CL 89* 93* 90* 88* 90*  CO2 30 31 28 30 30   GLUCOSE 129* 70 86 102* 183*  BUN 57* 56* 63* 71* 90*  CREATININE 3.27* 3.12* 2.88* 3.20* 3.09*  CALCIUM 11.3* 9.9 9.6 10.1 9.3  MG 1.6*  --   --   --   --   PHOS  --   --   --  4.4  --     Liver Function Tests: Recent Labs  Lab 04/13/23 0148 04/17/23 0432  AST 31  --   ALT 25  --   ALKPHOS 81  --   BILITOT 0.9  --   PROT 8.3*  --   ALBUMIN 3.9 3.4*   Recent Labs  Lab 04/13/23 0148  LIPASE 28   No results for input(s): "AMMONIA" in the last 168 hours.  CBC: Recent Labs  Lab 04/12/23 1305 04/13/23 0148 04/14/23 0515 04/15/23 0433 04/16/23 0543  WBC 16.3* 20.6* 31.1* 17.6* 14.3*  HGB 12.5* 13.6 11.7* 11.3* 11.9*  HCT 38.0* 40.8 36.0* 33.6* 37.2*  MCV 93.4 95.1 95.7 95.2 97.9  PLT 196 226 187 169 201    Cardiac Enzymes: Recent Labs  Lab 04/12/23 1535  CKTOTAL 82    BNP: Invalid input(s): "POCBNP"  CBG: Recent Labs  Lab 04/17/23 1135 04/17/23 1646 04/17/23 2158 04/18/23 0746 04/18/23 1151  GLUCAP 169* 186* 170* 154* 155*    Microbiology: Results for orders placed or performed during the hospital encounter of 04/12/23  SARS Coronavirus  2 by RT PCR (hospital order, performed in [facility] hospital lab) *cepheid single result test* Anterior Nasal Swab     Status: None   Collection Time: 04/13/23 10:53 AM   Specimen: Anterior Nasal Swab  Result Value Ref Range Status   SARS Coronavirus 2 by RT PCR NEGATIVE NEGATIVE Final    Comment: (NOTE) SARS-CoV-2 target nucleic acids are NOT DETECTED.  The SARS-CoV-2 RNA is generally detectable in upper and lower respiratory specimens during the acute phase of infection. The lowest concentration of SARS-CoV-2 viral copies this assay can detect is 250 copies / mL. A negative result does not preclude SARS-CoV-2 infection and should not be used as the sole basis for treatment or other patient management  decisions.  A negative result may occur with improper specimen collection / handling, submission of specimen other than nasopharyngeal swab, presence of viral mutation(s) within the areas targeted by this assay, and inadequate number of viral copies (<250 copies / mL). A negative result must be combined with clinical observations, patient history, and epidemiological information.  Fact Sheet for Patients:   RoadLapTop.co.za  Fact Sheet for Healthcare Providers: http://kim-miller.com/  This test is not yet approved or  cleared by the [place] FDA and has been authorized for detection and/or diagnosis of SARS-CoV-2 by FDA under an Emergency Use Authorization (EUA).  This EUA will remain in effect (meaning this test can be used) for the duration of the COVID-19 declaration under Section 564(b)(1) of the Act, 21 U.S.C. section 360bbb-3(b)(1), unless the authorization is terminated or revoked sooner.  Performed at [facility], [address] [place], [place] [address]   Respiratory (~20 pathogens) panel by PCR     Status: None   Collection Time: 04/14/23  2:30 AM   Specimen: Nasopharyngeal Swab; Respiratory  Result Value Ref Range Status   Adenovirus NOT DETECTED NOT DETECTED Final   Coronavirus 229E NOT DETECTED NOT DETECTED Final    Comment: (NOTE) The Coronavirus on the Respiratory Panel, DOES NOT test for the novel  Coronavirus (2019 nCoV)    Coronavirus HKU1 NOT DETECTED NOT DETECTED Final   Coronavirus NL63 NOT DETECTED NOT DETECTED Final   Coronavirus OC43 NOT DETECTED NOT DETECTED Final   Metapneumovirus NOT DETECTED NOT DETECTED Final   Rhinovirus / Enterovirus NOT DETECTED NOT DETECTED Final   Influenza A NOT DETECTED NOT DETECTED Final   Influenza B NOT DETECTED NOT DETECTED Final   Parainfluenza Virus 1 NOT DETECTED NOT DETECTED Final   Parainfluenza Virus 2 NOT DETECTED NOT DETECTED Final   Parainfluenza  Virus 3 NOT DETECTED NOT DETECTED Final   Parainfluenza Virus 4 NOT DETECTED NOT DETECTED Final   Respiratory Syncytial Virus NOT DETECTED NOT DETECTED Final   Bordetella pertussis NOT DETECTED NOT DETECTED Final   Bordetella Parapertussis NOT DETECTED NOT DETECTED Final   Chlamydophila pneumoniae NOT DETECTED NOT DETECTED Final   Mycoplasma pneumoniae NOT DETECTED NOT DETECTED Final    Comment: Performed at [facility], [address], [place], [place] [address]  MRSA Next Gen by PCR, Nasal     Status: None   Collection Time: 04/14/23  2:30 AM   Specimen: Nasal Mucosa; Nasal Swab  Result Value Ref Range Status   MRSA by PCR Next Gen NOT DETECTED NOT DETECTED Final    Comment: (NOTE) The GeneXpert MRSA Assay (FDA approved for NASAL specimens only), is one component of a comprehensive MRSA colonization surveillance program. It is not intended to diagnose MRSA infection nor  to guide or monitor treatment for MRSA infections. Test performance is not FDA approved in patients less than 26 years old. Performed at [facility], [address] [place], [place] [address]   Culture, blood (Routine X 2) w Reflex to ID Panel     Status: None (Preliminary result)   Collection Time: 04/14/23  9:25 AM   Specimen: BLOOD  Result Value Ref Range Status   Specimen Description BLOOD BLOOD RIGHT HAND  Final   Special Requests   Final    BOTTLES DRAWN AEROBIC AND ANAEROBIC Blood Culture adequate volume   Culture   Final    NO GROWTH 4 DAYS Performed at [facility], [address], [place], [place] [address]    Report Status PENDING  Incomplete  Culture, blood (Routine X 2) w Reflex to ID Panel     Status: None (Preliminary result)   Collection Time: 04/14/23  9:25 AM   Specimen: BLOOD LEFT HAND  Result Value Ref Range Status   Specimen Description   Final    BLOOD LEFT HAND Performed at [facility], [address] [place], [place] [address]    Special Requests    Final    BOTTLES DRAWN AEROBIC AND ANAEROBIC Blood Culture adequate volume Performed at [facility], [address] [place], [place] [address]    Culture  Setup Time   Final    GRAM POSITIVE COCCI ANAEROBIC BOTTLE ONLY CRITICAL RESULT CALLED TO, READ BACK BY AND VERIFIED WITH: [email or web address]  04/17/13 RH Performed at [facility], [address], [place], [place] [address]    Culture GRAM POSITIVE COCCI  Final   Report Status PENDING  Incomplete  Blood Culture ID Panel (Reflexed)     Status: Abnormal   Collection Time: 04/14/23  9:25 AM  Result Value Ref Range Status   Enterococcus faecalis NOT DETECTED NOT DETECTED Final   Enterococcus Faecium NOT DETECTED NOT DETECTED Final   Listeria monocytogenes NOT DETECTED NOT DETECTED Final   Staphylococcus species DETECTED (A) NOT DETECTED Final    Comment: CRITICAL RESULT CALLED TO, READ BACK BY AND VERIFIED WITH: JASON [email or web address]  04/18/23 RH    Staphylococcus aureus (BCID) NOT DETECTED NOT DETECTED Final   Staphylococcus epidermidis NOT DETECTED NOT DETECTED Final   Staphylococcus lugdunensis DETECTED (A) NOT DETECTED Final    Comment: CRITICAL RESULT CALLED TO, READ BACK BY AND VERIFIED WITH: JASON [email or web address]  04/18/23 RH    Streptococcus species NOT DETECTED NOT DETECTED Final   Streptococcus agalactiae NOT DETECTED NOT DETECTED Final   Streptococcus pneumoniae NOT DETECTED NOT DETECTED Final   Streptococcus pyogenes NOT DETECTED NOT DETECTED Final   A.calcoaceticus-baumannii NOT DETECTED NOT DETECTED Final   Bacteroides fragilis NOT DETECTED NOT DETECTED Final   Enterobacterales NOT DETECTED NOT DETECTED Final   Enterobacter cloacae complex NOT DETECTED NOT DETECTED Final   Escherichia coli NOT DETECTED NOT DETECTED Final   Klebsiella aerogenes NOT DETECTED NOT DETECTED Final   Klebsiella oxytoca NOT DETECTED NOT DETECTED Final   Klebsiella pneumoniae NOT DETECTED NOT DETECTED Final   Proteus species NOT  DETECTED NOT DETECTED Final   Salmonella species NOT DETECTED NOT DETECTED Final   Serratia marcescens NOT DETECTED NOT DETECTED Final   Haemophilus influenzae NOT DETECTED NOT DETECTED Final   Neisseria meningitidis NOT DETECTED NOT DETECTED Final   Pseudomonas aeruginosa NOT DETECTED NOT DETECTED Final   Stenotrophomonas maltophilia NOT DETECTED NOT DETECTED Final   Candida albicans NOT DETECTED NOT DETECTED  Final   Candida auris NOT DETECTED NOT DETECTED Final   Candida glabrata NOT DETECTED NOT DETECTED Final   Candida krusei NOT DETECTED NOT DETECTED Final   Candida parapsilosis NOT DETECTED NOT DETECTED Final   Candida tropicalis NOT DETECTED NOT DETECTED Final   Cryptococcus neoformans/gattii NOT DETECTED NOT DETECTED Final   Methicillin resistance mecA/C NOT DETECTED NOT DETECTED Final    Comment: Performed at [facility], [address] [place], [place] [address]    Coagulation Studies: No results for input(s): "LABPROT", "INR" in the last 72 hours.   Urinalysis: No results for input(s): "COLORURINE", "LABSPEC", "PHURINE", "GLUCOSEU", "HGBUR", "BILIRUBINUR", "KETONESUR", "PROTEINUR", "UROBILINOGEN", "NITRITE", "LEUKOCYTESUR" in the last 72 hours.  Invalid input(s): "APPERANCEUR"    Imaging: No results found.   Medications:    sodium chloride      ceFAZolin (ANCEF) IV      allopurinol  100 mg Oral Daily   amiodarone  200 mg Oral Daily   aspirin EC  81 mg Oral Daily   carvedilol  6.25 mg Oral BID WC   doxazosin  4 mg Oral QHS   enoxaparin (LOVENOX) injection  0.5 mg/kg Subcutaneous Q24H   fluticasone  2 spray Each Nare BID   fluticasone furoate-vilanterol  1 puff Inhalation Daily   insulin aspart  0-20 Units Subcutaneous TID WC   insulin aspart  0-5 Units Subcutaneous QHS   insulin aspart  5 Units Subcutaneous TID WC   insulin glargine-yfgn  30 Units Subcutaneous BID   isosorbide mononitrate  30 mg Oral Daily   levothyroxine  112 mcg Oral Q0600    lidocaine  1 patch Transdermal Q24H   linagliptin  5 mg Oral Daily   living well with diabetes book   Does not apply Once   loratadine  10 mg Oral Daily   sodium chloride flush  3 mL Intravenous Q12H   torsemide  40 mg Oral BID   sodium chloride, acetaminophen, albuterol, cyclobenzaprine, ipratropium, LORazepam, morphine injection, ondansetron (ZOFRAN) IV, oxyCODONE, sodium chloride flush, traZODone  Assessment/ Plan:  Mr. Jason Hudson is a 72 y.o.  male with past medical history of chronic systolic heart failure, atrial fibrillation on anticoagulation and chronic kidney disease stage IV.  Patient presents to the emergency department complaining of shortness of breath with chest pain and lower extremity edema.  Patient has been admitted for Shortness of breath [R06.02] CHF (congestive heart failure) (HCC) [I50.9] Edema, unspecified type [R60.9] Chest pain, unspecified type [R07.9]   Acute Kidney Injury on chronic kidney disease stage IV with baseline creatinine 2.5 and GFR of 27 on 03/20/23.  Acute kidney injury secondary to fluid overload and aggressive diuresis Chronic kidney disease is secondary to poor diabetic control Awaiting renal ultrasound.  No IV contrast exposure.  Diuretics held due to  increased creatinine.   Creatinine stable, good urine output. Continue current diuretic regimen. Will schedule outpatient appt in our office at discharge.   Lab Results  Component Value Date   CREATININE 3.09 (H) 04/18/2023   CREATININE 3.20 (H) 04/17/2023   CREATININE 2.88 (H) 04/16/2023    Intake/Output Summary (Last 24 hours) at 04/18/2023 1227 Last data filed at 04/18/2023 1052 Gross per 24 hour  Intake 720 ml  Output 2775 ml  Net -2055 ml    2.  Acute on chronic diastolic heart failure.  Echo completed this admission shows EF 55 to 60%.  Continue Torsemide   3. Anemia of chronic kidney disease Lab Results  Component Value  Date   HGB 11.9 (L) 04/16/2023    Hemoglobin  within desired range.  4. 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 Home regimen includes [place]. Most recent hemoglobin A1c is 11.3 on 04/13/23.   5. Hyponatremia likely secondary to volume status. Placed on fluid restriction and continue diuresis. Sodium 131    LOS: 6 Corbin Falck 8/27/202412:27 PM

## 2023-04-18 NOTE — Consult Note (Signed)
NAME: Jason Hudson  DOB: [birth year]  MRN: [id number]  Date/Time: 04/18/2023 3:04 PM  REQUESTING PROVIDER: Dr.Amin Subjective:  REASON FOR CONSULT: staph lugdunensis bacteremia ? Jason Hudson is a 72 y.o. with a history of HFrEF, LVEF 40%, PAF, GI bleed, CKD, COPD, IDDM , gout , hypothyroidism. BPH morbid obesity presented on 04/12/23 with chest pain , shortness of breath and leg swelling  of 2 days duration. Pt says he had severe cramp in the back and front of chest Chest pain worse with cough and deep breath on the rt. Did not have fever at home  04/12/23 13:01  BP 133/82  Temp 98 F (36.7 C)  Pulse Rate 98  Resp 18  SpO2 96 %  O2 Flow Rate (L/min) 3 L/min    Latest Reference Range & Units 04/12/23 13:05  WBC 4.0 - 10.5 K/uL 16.3 (H)  Hemoglobin 13.0 - 17.0 g/dL 40.9 (L)  HCT 81.1 - 91.4 % 38.0 (L)  Platelets 150 - 400 K/uL 196  Creatinine 0.61 - 1.24 mg/dL 7.82 (H)  CXR showed elevation of rt hemi diaphragm And cardiomegaly BNP was normal He initially was diagnosed with CHF And MSK pain of the chest He developed fever on 8/22 of 103 and wbc wnt upto 31, SARS cov negative Resp panel neg Started on ceftriaxone and azithro Fever resolved in 24 hrs, but wbc was high at 31 and then changed to cefepime   CT chest done on 8/23  showed diffuse thyroid enlargement, cardiomegaly, bibasilar atelectasis 2d echo a limited study due to body habitus Blood culture came back 60 hrs later as staph lugdunensis in 1 bottle and I am asked to see patient Pt is sitting in recliner, with a heating pad for mid back. He says he had a wound on his rt leg a week ago He has chronic swelling of legs  Past Medical History:  Diagnosis Date   CHF (congestive heart failure) (HCC)    COPD (chronic obstructive pulmonary disease) (HCC)    Diabetes mellitus without complication (HCC)    Edema    leg swelling   Gastroesophageal reflux disease 12/15/2017   Gout    Hypertension     Melena 11/05/2017   Pneumonia 07/27/2016    Past Surgical History:  Procedure Laterality Date   COLONOSCOPY WITH PROPOFOL N/A 11/07/2017   Procedure: COLONOSCOPY WITH PROPOFOL;  Surgeon: Wyline Mood, MD;  Location: [facility] ENDOSCOPY;  Service: Gastroenterology;  Laterality: N/A;   ESOPHAGOGASTRODUODENOSCOPY (EGD) WITH PROPOFOL N/A 11/07/2017   Procedure: ESOPHAGOGASTRODUODENOSCOPY (EGD) WITH PROPOFOL;  Surgeon: Wyline Mood, MD;  Location: [facility] ENDOSCOPY;  Service: Gastroenterology;  Laterality: N/A;   LEFT HEART CATH AND CORONARY ANGIOGRAPHY N/A 10/23/2017   Procedure: LEFT HEART CATH AND CORONARY ANGIOGRAPHY;  Surgeon: Marcina Millard, MD;  Location: [facility] INVASIVE CV LAB;  Service: Cardiovascular;  Laterality: N/A;    Social History   Socioeconomic History   Marital status: Single    Spouse name: Not on file   Number of children: Not on file   Years of education: Not on file   Highest education level: Not on file  Occupational History   Not on file  Tobacco Use   Smoking status: Never   Smokeless tobacco: Never  Substance and Sexual Activity   Alcohol use: Not Currently    Comment: ocassionally   Drug use: Never   Sexual activity: Not on file  Other Topics Concern   Not on file  Social History Narrative   Not on  file   Social Determinants of Health   Financial Resource Strain: Not on file  Food Insecurity: No Food Insecurity (04/13/2023)   Hunger Vital Sign    Worried About Running Out of Food in the Last Year: Never true    Ran Out of Food in the Last Year: Never true  Transportation Needs: No Transportation Needs (04/13/2023)   PRAPARE - Administrator, Civil Service (Medical): No    Lack of Transportation (Non-Medical): No  Physical Activity: Not on file  Stress: Not on file  Social Connections: Not on file  Intimate Partner Violence: Not At Risk (04/13/2023)   Humiliation, Afraid, Rape, and Kick questionnaire    Fear of Current or Ex-Partner: No     Emotionally Abused: No    Physically Abused: No    Sexually Abused: No    Family History  Problem Relation Age of Onset   Cancer Mother    CVA Father    Heart disease Father    Allergies  Allergen Reactions   Atorvastatin Other (See Comments)    Other reaction(s): Not available Other reaction(s): Other (See Comments)    Red Dye #40 (Allura Red) Other (See Comments)    "not as allergic to it as I used to be. I can take a little of it"   I? Current Facility-Administered Medications  Medication Dose Route Frequency Provider Last Rate Last Admin   0.9 %  sodium chloride infusion  250 mL Intravenous PRN Mikey College T, MD       acetaminophen (TYLENOL) tablet 650 mg  650 mg Oral Q4H PRN Mikey College T, MD   650 mg at 04/15/23 0555   albuterol (PROVENTIL) (2.5 MG/3ML) 0.083% nebulizer solution 2.5 mg  2.5 mg Nebulization Q6H PRN Mikey College T, MD   2.5 mg at 04/14/23 0247   allopurinol (ZYLOPRIM) tablet 100 mg  100 mg Oral Daily Mikey College T, MD   100 mg at 04/18/23 1610   amiodarone (PACERONE) tablet 200 mg  200 mg Oral Daily Mikey College T, MD   200 mg at 04/18/23 9604   aspirin EC tablet 81 mg  81 mg Oral Daily Mikey College T, MD   81 mg at 04/18/23 0818   carvedilol (COREG) tablet 6.25 mg  6.25 mg Oral BID WC Mikey College T, MD   6.25 mg at 04/18/23 5409   ceFAZolin (ANCEF) IVPB 2g/100 mL premix  2 g Intravenous Q12H Arnetha Courser, MD 200 mL/hr at 04/18/23 1229 2 g at 04/18/23 1229   cyclobenzaprine (FLEXERIL) tablet 10 mg  10 mg Oral TID PRN Arnetha Courser, MD   10 mg at 04/17/23 1719   doxazosin (CARDURA) tablet 4 mg  4 mg Oral QHS Mikey College T, MD   4 mg at 04/17/23 2209   enoxaparin (LOVENOX) injection 75 mg  0.5 mg/kg Subcutaneous Q24H Hallaji, Sheema M, RPH   75 mg at 04/18/23 0815   fluticasone (FLONASE) 50 MCG/ACT nasal spray 2 spray  2 spray Each Nare BID Mikey College T, MD   2 spray at 04/17/23 1054   fluticasone furoate-vilanterol (BREO ELLIPTA) 200-25 MCG/ACT 1 puff  1 puff  Inhalation Daily Arnetha Courser, MD   1 puff at 04/18/23 0817   insulin aspart (novoLOG) injection 0-20 Units  0-20 Units Subcutaneous TID WC Arnetha Courser, MD   4 Units at 04/18/23 1204   insulin aspart (novoLOG) injection 0-5 Units  0-5 Units Subcutaneous QHS Emeline General, MD   5  Units at 04/12/23 2214   insulin aspart (novoLOG) injection 5 Units  5 Units Subcutaneous TID WC Arnetha Courser, MD   5 Units at 04/18/23 1205   insulin glargine-yfgn (SEMGLEE) injection 30 Units  30 Units Subcutaneous BID Arnetha Courser, MD   30 Units at 04/18/23 0817   ipratropium (ATROVENT) nebulizer solution 0.5 mg  0.5 mg Nebulization Q6H PRN Mikey College T, MD       isosorbide mononitrate (IMDUR) 24 hr tablet 30 mg  30 mg Oral Daily Mikey College T, MD   30 mg at 04/18/23 1610   levothyroxine (SYNTHROID) tablet 112 mcg  112 mcg Oral Q0600 Arnetha Courser, MD   112 mcg at 04/18/23 0549   lidocaine (LIDODERM) 5 % 1 patch  1 patch Transdermal Q24H Mikey College T, MD   1 patch at 04/17/23 1719   linagliptin (TRADJENTA) tablet 5 mg  5 mg Oral Daily Mikey College T, MD   5 mg at 04/18/23 0815   living well with diabetes book MISC   Does not apply Once Arnetha Courser, MD       loratadine (CLARITIN) tablet 10 mg  10 mg Oral Daily Mikey College T, MD   10 mg at 04/18/23 9604   LORazepam (ATIVAN) tablet 2 mg  2 mg Oral Q6H PRN Arnetha Courser, MD   2 mg at 04/15/23 0554   morphine (PF) 2 MG/ML injection 2 mg  2 mg Intravenous Q2H PRN Andris Baumann, MD   2 mg at 04/17/23 2242   ondansetron (ZOFRAN) injection 4 mg  4 mg Intravenous Q6H PRN Mikey College T, MD       oxyCODONE (Oxy IR/ROXICODONE) immediate release tablet 10 mg  10 mg Oral Q6H PRN Arnetha Courser, MD   10 mg at 04/18/23 1501   sodium chloride flush (NS) 0.9 % injection 3 mL  3 mL Intravenous Q12H Mikey College T, MD   3 mL at 04/18/23 5409   sodium chloride flush (NS) 0.9 % injection 3 mL  3 mL Intravenous PRN Mikey College T, MD       spironolactone (ALDACTONE) tablet 25 mg  25 mg  Oral Daily Kolluru, Sarath, MD       torsemide (DEMADEX) tablet 40 mg  40 mg Oral BID Arnetha Courser, MD   40 mg at 04/18/23 0814   traZODone (DESYREL) tablet 50 mg  50 mg Oral QHS PRN Mikey College T, MD   50 mg at 04/14/23 2133     Abtx:  Anti-infectives (From admission, onward)    Start     Dose/Rate Route Frequency Ordered Stop   04/18/23 1030  ceFAZolin (ANCEF) IVPB 2g/100 mL premix        2 g 200 mL/hr over 30 Minutes Intravenous Every 12 hours 04/18/23 0940     04/17/23 1115  ceFEPIme (MAXIPIME) 2 g in sodium chloride 0.9 % 100 mL IVPB        2 g 200 mL/hr over 30 Minutes Intravenous 2 times daily 04/17/23 1018 04/17/23 2306   04/15/23 1000  ceFEPIme (MAXIPIME) 2 g in sodium chloride 0.9 % 100 mL IVPB  Status:  Discontinued        2 g 200 mL/hr over 30 Minutes Intravenous Every 12 hours 04/15/23 0930 04/17/23 1018   04/14/23 1000  ceFEPIme (MAXIPIME) 2 g in sodium chloride 0.9 % 100 mL IVPB  Status:  Discontinued        2 g 200 mL/hr over 30 Minutes Intravenous Every  24 hours 04/14/23 0835 04/15/23 0930   04/13/23 1330  cefTRIAXone (ROCEPHIN) 2 g in sodium chloride 0.9 % 100 mL IVPB  Status:  Discontinued        2 g 200 mL/hr over 30 Minutes Intravenous Daily 04/13/23 1315 04/14/23 0834   04/13/23 1330  azithromycin (ZITHROMAX) 500 mg in sodium chloride 0.9 % 250 mL IVPB  Status:  Discontinued        500 mg 250 mL/hr over 60 Minutes Intravenous Daily 04/13/23 1316 04/17/23 1018       REVIEW OF SYSTEMS:  Const:  fever, negative chills, negative weight loss Eyes: negative diplopia or visual changes, negative eye pain ENT: negative coryza, negative sore throat Resp: negative cough, hemoptysis, has dyspnea Cards: chest pain, back pain ,palpitations, lower extremity edema GU: negative for frequency, dysuria and hematuria GI: Negative for abdominal pain, diarrhea, bleeding, constipation Skin: negative for rash and pruritus Heme: negative for easy bruising and gum/nose  bleeding MS: weakness Neurolo:negative for headaches, dizziness, vertigo, memory problems  Psych: negative for feelings of anxiety, depression  Endocrine:  diabetes Allergy/Immunology- as above?  Objective:  VITALS:  BP (!) 140/77 (BP Location: Left Arm)   Pulse 73   Temp 97.6 F (36.4 C) (Oral)   Resp 18   Ht 5\' 8"  (1.727 m)   Wt (!) 147.8 kg   SpO2 99%   BMI 49.54 kg/m   PHYSICAL EXAM:  General: Alert, cooperative, morbid obesity  Head: Normocephalic, without obvious abnormality, atraumatic. Eyes: Conjunctivae clear, anicteric sclerae. Pupils are equal ENT Nares normal. No drainage or sinus tenderness. Lips, mucosa, and tongue normal. No Thrush Neck: Supple, symmetrical, no adenopathy, thyroid: non tender no carotid bruit and no JVD. Back: No CVA tenderness. Lungs: b/l air entry Heart: s1s2 Abdomen: did not examine as pt was in the recliner Back no thoracic spine tenderness Extremities: edema legs. B/l dusky discoloration Left toe nails broken         Skin: No rashes or lesions. Or bruising Lymph: Cervical, supraclavicular normal. Neurologic: Grossly non-focal Pertinent Labs Lab Results CBC    Component Value Date/Time   WBC 14.3 (H) 04/16/2023 0543   RBC 3.80 (L) 04/16/2023 0543   HGB 11.9 (L) 04/16/2023 0543   HGB 11.6 (L) 10/21/2020 1102   HCT 37.2 (L) 04/16/2023 0543   HCT 35.9 (L) 10/21/2020 1102   PLT 201 04/16/2023 0543   PLT 311 10/21/2020 1102   MCV 97.9 04/16/2023 0543   MCV 91 10/21/2020 1102   MCH 31.3 04/16/2023 0543   MCHC 32.0 04/16/2023 0543   RDW 14.0 04/16/2023 0543   RDW 16.3 (H) 10/21/2020 1102   LYMPHSABS 1.7 10/21/2020 1102   MONOABS 0.8 07/02/2018 0910   EOSABS 0.3 10/21/2020 1102   BASOSABS 0.1 10/21/2020 1102       Latest Ref Rng & Units 04/18/2023    9:39 AM 04/17/2023    4:32 AM 04/16/2023    5:43 AM  CMP  Glucose 70 - 99 mg/dL 220  254  86   BUN 8 - 23 mg/dL 90  71  63   Creatinine 0.61 - 1.24 mg/dL 2.70  6.23   7.62   Sodium 135 - 145 mmol/L 131  134  130   Potassium 3.5 - 5.1 mmol/L 3.9  4.1  4.0   Chloride 98 - 111 mmol/L 90  88  90   CO2 22 - 32 mmol/L 30  30  28    Calcium 8.9 - 10.3 mg/dL 9.3  10.1  9.6       Microbiology: Recent Results (from the past 240 hour(s))  SARS Coronavirus 2 by RT PCR (hospital order, performed in [facility] hospital lab) *cepheid single result test* Anterior Nasal Swab     Status: None   Collection Time: 04/13/23 10:53 AM   Specimen: Anterior Nasal Swab  Result Value Ref Range Status   SARS Coronavirus 2 by RT PCR NEGATIVE NEGATIVE Final    Comment: (NOTE) SARS-CoV-2 target nucleic acids are NOT DETECTED.  The SARS-CoV-2 RNA is generally detectable in upper and lower respiratory specimens during the acute phase of infection. The lowest concentration of SARS-CoV-2 viral copies this assay can detect is 250 copies / mL. A negative result does not preclude SARS-CoV-2 infection and should not be used as the sole basis for treatment or other patient management decisions.  A negative result may occur with improper specimen collection / handling, submission of specimen other than nasopharyngeal swab, presence of viral mutation(s) within the areas targeted by this assay, and inadequate number of viral copies (<250 copies / mL). A negative result must be combined with clinical observations, patient history, and epidemiological information.  Fact Sheet for Patients:   RoadLapTop.co.za  Fact Sheet for Healthcare Providers: http://kim-miller.com/  This test is not yet approved or  cleared by the [place] FDA and has been authorized for detection and/or diagnosis of SARS-CoV-2 by FDA under an Emergency Use Authorization (EUA).  This EUA will remain in effect (meaning this test can be used) for the duration of the COVID-19 declaration under Section 564(b)(1) of the Act, 21 U.S.C. section 360bbb-3(b)(1), unless the  authorization is terminated or revoked sooner.  Performed at [facility], [address] [place], [place] [address]   Respiratory (~20 pathogens) panel by PCR     Status: None   Collection Time: 04/14/23  2:30 AM   Specimen: Nasopharyngeal Swab; Respiratory  Result Value Ref Range Status   Adenovirus NOT DETECTED NOT DETECTED Final   Coronavirus 229E NOT DETECTED NOT DETECTED Final    Comment: (NOTE) The Coronavirus on the Respiratory Panel, DOES NOT test for the novel  Coronavirus (2019 nCoV)    Coronavirus HKU1 NOT DETECTED NOT DETECTED Final   Coronavirus NL63 NOT DETECTED NOT DETECTED Final   Coronavirus OC43 NOT DETECTED NOT DETECTED Final   Metapneumovirus NOT DETECTED NOT DETECTED Final   Rhinovirus / Enterovirus NOT DETECTED NOT DETECTED Final   Influenza A NOT DETECTED NOT DETECTED Final   Influenza B NOT DETECTED NOT DETECTED Final   Parainfluenza Virus 1 NOT DETECTED NOT DETECTED Final   Parainfluenza Virus 2 NOT DETECTED NOT DETECTED Final   Parainfluenza Virus 3 NOT DETECTED NOT DETECTED Final   Parainfluenza Virus 4 NOT DETECTED NOT DETECTED Final   Respiratory Syncytial Virus NOT DETECTED NOT DETECTED Final   Bordetella pertussis NOT DETECTED NOT DETECTED Final   Bordetella Parapertussis NOT DETECTED NOT DETECTED Final   Chlamydophila pneumoniae NOT DETECTED NOT DETECTED Final   Mycoplasma pneumoniae NOT DETECTED NOT DETECTED Final    Comment: Performed at [facility], [address] [place], [place] [address]  MRSA Next Gen by PCR, Nasal     Status: None   Collection Time: 04/14/23  2:30 AM   Specimen: Nasal Mucosa; Nasal Swab  Result Value Ref Range Status   MRSA by PCR Next Gen NOT DETECTED NOT DETECTED Final    Comment: (NOTE) The GeneXpert MRSA Assay (FDA approved for NASAL specimens only),  is one component of a comprehensive MRSA colonization surveillance program. It is not intended to diagnose MRSA infection nor to guide or monitor  treatment for MRSA infections. Test performance is not FDA approved in patients less than 53 years old. Performed at [facility], [address] [place], [place] [address]   Culture, blood (Routine X 2) w Reflex to ID Panel     Status: None (Preliminary result)   Collection Time: 04/14/23  9:25 AM   Specimen: BLOOD  Result Value Ref Range Status   Specimen Description BLOOD BLOOD RIGHT HAND  Final   Special Requests   Final    BOTTLES DRAWN AEROBIC AND ANAEROBIC Blood Culture adequate volume   Culture   Final    NO GROWTH 4 DAYS Performed at [facility], [address] [place], [place] [address]    Report Status PENDING  Incomplete  Culture, blood (Routine X 2) w Reflex to ID Panel     Status: None (Preliminary result)   Collection Time: 04/14/23  9:25 AM   Specimen: BLOOD LEFT HAND  Result Value Ref Range Status   Specimen Description   Final    BLOOD LEFT HAND Performed at [facility], [address] [place], [place] [address]    Special Requests   Final    BOTTLES DRAWN AEROBIC AND ANAEROBIC Blood Culture adequate volume Performed at [facility], [address] [place], [place] [address]    Culture  Setup Time   Final    GRAM POSITIVE COCCI ANAEROBIC BOTTLE ONLY CRITICAL RESULT CALLED TO, READ BACK BY AND VERIFIED WITH: JASON [email or web address]  04/17/13 RH Performed at [facility], [address], [place], [place] [address]    Culture GRAM POSITIVE COCCI  Final   Report Status PENDING  Incomplete  Blood Culture ID Panel (Reflexed)     Status: Abnormal   Collection Time: 04/14/23  9:25 AM  Result Value Ref Range Status   Enterococcus faecalis NOT DETECTED NOT DETECTED Final   Enterococcus Faecium NOT DETECTED NOT DETECTED Final   Listeria monocytogenes NOT DETECTED NOT DETECTED Final   Staphylococcus species DETECTED (A) NOT DETECTED Final    Comment: CRITICAL RESULT CALLED TO, READ BACK BY AND VERIFIED WITH: JASON  [email or web address]  04/18/23 RH    Staphylococcus aureus (BCID) NOT DETECTED NOT DETECTED Final   Staphylococcus epidermidis NOT DETECTED NOT DETECTED Final   Staphylococcus lugdunensis DETECTED (A) NOT DETECTED Final    Comment: CRITICAL RESULT CALLED TO, READ BACK BY AND VERIFIED WITH: JASON [email or web address]  04/18/23 RH    Streptococcus species NOT DETECTED NOT DETECTED Final   Streptococcus agalactiae NOT DETECTED NOT DETECTED Final   Streptococcus pneumoniae NOT DETECTED NOT DETECTED Final   Streptococcus pyogenes NOT DETECTED NOT DETECTED Final   A.calcoaceticus-baumannii NOT DETECTED NOT DETECTED Final   Bacteroides fragilis NOT DETECTED NOT DETECTED Final   Enterobacterales NOT DETECTED NOT DETECTED Final   Enterobacter cloacae complex NOT DETECTED NOT DETECTED Final   Escherichia coli NOT DETECTED NOT DETECTED Final   Klebsiella aerogenes NOT DETECTED NOT DETECTED Final   Klebsiella oxytoca NOT DETECTED NOT DETECTED Final   Klebsiella pneumoniae NOT DETECTED NOT DETECTED Final   Proteus species NOT DETECTED NOT DETECTED Final   Salmonella species NOT DETECTED NOT DETECTED Final   Serratia marcescens NOT DETECTED NOT DETECTED Final   Haemophilus influenzae NOT DETECTED NOT DETECTED Final   Neisseria meningitidis NOT DETECTED NOT DETECTED Final   Pseudomonas aeruginosa NOT DETECTED NOT  DETECTED Final   Stenotrophomonas maltophilia NOT DETECTED NOT DETECTED Final   Candida albicans NOT DETECTED NOT DETECTED Final   Candida auris NOT DETECTED NOT DETECTED Final   Candida glabrata NOT DETECTED NOT DETECTED Final   Candida krusei NOT DETECTED NOT DETECTED Final   Candida parapsilosis NOT DETECTED NOT DETECTED Final   Candida tropicalis NOT DETECTED NOT DETECTED Final   Cryptococcus neoformans/gattii NOT DETECTED NOT DETECTED Final   Methicillin resistance mecA/C NOT DETECTED NOT DETECTED Final    Comment: Performed at [facility], [address], [place], [place] [address]     IMAGING RESULTS:  I have personally reviewed the films ?atelectasis b/l Impression/Recommendation ?pt presenting with cramping chest pain, also had back pain, midthoracic, fever after hospitalization WBC went upto 31 Staph lugdunensis in 1 blood culture. Late time to positivity. I would not negate this as a contaminant. Especially with fever , high wbc which now hs responded to appropriate antibiotic need to r/o deep seated infection- source could be the legs Need to r/o thoracic spine involvement especially with pain and continuously using heating pad Pt was on ceftriaxone, cefepime and now cefazolin Continue that Recommend MRI of the Thoracic spine  B/l lymphedema legs With trauma to the left toes Had a calf wound last week . Healed  CKD  CHF   Obesity hypoventilation syndrome  ? PAF on amiodarone? ___________________________________________________ Discussed with patient,and nurse Note:  This document was prepared using Dragon voice recognition software and may include unintentional dictation errors.

## 2023-04-18 NOTE — Progress Notes (Signed)
Physical Therapy Treatment Patient Details Name: Jason Hudson MRN: [id number] DOB: Jan 15, 1951 Today's Date: 04/18/2023   History of Present Illness Jason Hudson is a 72 y.o. male with a history of hypertension, diabetes, CKD, CAD, dismal atrial fibrillation, CHF, amyloid cyst, and COPD who presents to hospital with sepsis due to pneumonia.    PT Comments  Pt resting in recliner upon PT arrival; agreeable to therapy; pt used urinal in standing per pt request.  During session pt modified independent with transfers and CGA to ambulate 50 feet with walker use.  Limited distance ambulating d/t pt fatigue and generalized weakness; pt noted with slower gait speed today but able to walk a little further than yesterday.  Pt's SpO2 sats 92% or greater on 3 L O2 via nasal cannula during sessions activities.  Will continue to focus on strengthening, endurance, and progressive functional mobility per pt tolerance.    If plan is discharge home, recommend the following: A little help with walking and/or transfers;A lot of help with bathing/dressing/bathroom;Assist for transportation;Assistance with cooking/housework   Can travel by private vehicle     No  Equipment Recommendations  Rolling walker (2 wheels) (Bariatric)    Recommendations for Other Services       Precautions / Restrictions Precautions Precautions: Fall Restrictions Weight Bearing Restrictions: No     Mobility  Bed Mobility               General bed mobility comments: Deferred (pt in recliner beginning/end of session)    Transfers Overall transfer level: Modified independent Equipment used: Rolling walker (2 wheels) (bariatric) Transfers: Sit to/from Stand Sit to Stand: Modified independent (Device/Increase time)           General transfer comment: mild increased effort to stand from recliner x2 trials; steady with walker use    Ambulation/Gait Ambulation/Gait assistance: Contact guard  assist Gait Distance (Feet): 50 Feet Assistive device: Rolling walker (2 wheels) (bariatric) Gait Pattern/deviations: Decreased step length - right, Decreased step length - left Gait velocity: decreased     General Gait Details: wider BOS; decreased B LE foot clearance and step length; limited d/t SOB   Stairs             Wheelchair Mobility     Tilt Bed    Modified Rankin (Stroke Patients Only)       Balance Overall balance assessment: Needs assistance Sitting-balance support: No upper extremity supported, Feet supported Sitting balance-Leahy Scale: Good Sitting balance - Comments: steady reaching within BOS   Standing balance support: Bilateral upper extremity supported, During functional activity, Reliant on assistive device for balance Standing balance-Leahy Scale: Good Standing balance comment: steady ambulating with walker use                            Cognition Arousal: Alert Behavior During Therapy: WFL for tasks assessed/performed Overall Cognitive Status: Within Functional Limits for tasks assessed                                          Exercises      General Comments        Pertinent Vitals/Pain Pain Assessment Pain Assessment: 0-10 Pain Score: 6  Pain Location: low back pain Pain Descriptors / Indicators: Aching, Constant, Spasm Pain Intervention(s): Limited activity within patient's tolerance, Monitored during session, Repositioned (pt reports  recent pain meds) HR WFL during sessions activities.    Home Living                          Prior Function            PT Goals (current goals can now be found in the care plan section) Acute Rehab PT Goals Patient Stated Goal: To improve breathing PT Goal Formulation: With patient Time For Goal Achievement: 04/28/23 Potential to Achieve Goals: Good Progress towards PT goals: Progressing toward goals    Frequency    Min 1X/week      PT  Plan      Co-evaluation              AM-PAC PT "6 Clicks" Mobility   Outcome Measure  Help needed turning from your back to your side while in a flat bed without using bedrails?: A Little Help needed moving from lying on your back to sitting on the side of a flat bed without using bedrails?: A Little Help needed moving to and from a bed to a chair (including a wheelchair)?: A Little Help needed standing up from a chair using your arms (e.g., wheelchair or bedside chair)?: A Little Help needed to walk in hospital room?: A Little Help needed climbing 3-5 steps with a railing? : Total 6 Click Score: 16    End of Session Equipment Utilized During Treatment: Gait belt;Oxygen (3 L via nasal cannula) Activity Tolerance: Patient limited by fatigue;Other (comment) (limited d/t SOB) Patient left: in chair;with call bell/phone within reach Nurse Communication: Mobility status;Precautions (no chair alarm in place upon PT arrival (pt has been calling and waiting for assist)) PT Visit Diagnosis: Difficulty in walking, not elsewhere classified (R26.2)     Time: 7616-0737 PT Time Calculation (min) (ACUTE ONLY): 25 min  Charges:    $Therapeutic Activity: 23-37 mins PT General Charges $$ ACUTE PT VISIT: 1 Visit                     Hendricks Limes, PT 04/18/23, 4:39 PM

## 2023-04-18 NOTE — Progress Notes (Signed)
PHARMACY - PHYSICIAN COMMUNICATION CRITICAL VALUE ALERT - BLOOD CULTURE IDENTIFICATION (BCID)  Jason Hudson is an 72 y.o. male who presented to [facility] on 04/12/2023 with a chief complaint of PNA/sepsis.  Assessment:  Staph lugdunensis in 1 of 4 bottles (anaerobic) , no resistance detected.  (include suspected source if known)  Name of physician (or Provider) Contacted: Mansy  Current antibiotics: None , pt completed 5 day course of cefepime on 8/26.   Changes to prescribed antibiotics recommended:  Patient is on recommended antibiotics - No changes needed  - MD does not want to restart abx   Results for orders placed or performed during the hospital encounter of 04/12/23  Blood Culture ID Panel (Reflexed) (Collected: 04/14/2023  9:25 AM)  Result Value Ref Range   Enterococcus faecalis NOT DETECTED NOT DETECTED   Enterococcus Faecium NOT DETECTED NOT DETECTED   Listeria monocytogenes NOT DETECTED NOT DETECTED   Staphylococcus species DETECTED (A) NOT DETECTED   Staphylococcus aureus (BCID) NOT DETECTED NOT DETECTED   Staphylococcus epidermidis NOT DETECTED NOT DETECTED   Staphylococcus lugdunensis DETECTED (A) NOT DETECTED   Streptococcus species NOT DETECTED NOT DETECTED   Streptococcus agalactiae NOT DETECTED NOT DETECTED   Streptococcus pneumoniae NOT DETECTED NOT DETECTED   Streptococcus pyogenes NOT DETECTED NOT DETECTED   A.calcoaceticus-baumannii NOT DETECTED NOT DETECTED   Bacteroides fragilis NOT DETECTED NOT DETECTED   Enterobacterales NOT DETECTED NOT DETECTED   Enterobacter cloacae complex NOT DETECTED NOT DETECTED   Escherichia coli NOT DETECTED NOT DETECTED   Klebsiella aerogenes NOT DETECTED NOT DETECTED   Klebsiella oxytoca NOT DETECTED NOT DETECTED   Klebsiella pneumoniae NOT DETECTED NOT DETECTED   Proteus species NOT DETECTED NOT DETECTED   Salmonella species NOT DETECTED NOT DETECTED   Serratia marcescens NOT DETECTED NOT DETECTED   Haemophilus  influenzae NOT DETECTED NOT DETECTED   Neisseria meningitidis NOT DETECTED NOT DETECTED   Pseudomonas aeruginosa NOT DETECTED NOT DETECTED   Stenotrophomonas maltophilia NOT DETECTED NOT DETECTED   Candida albicans NOT DETECTED NOT DETECTED   Candida auris NOT DETECTED NOT DETECTED   Candida glabrata NOT DETECTED NOT DETECTED   Candida krusei NOT DETECTED NOT DETECTED   Candida parapsilosis NOT DETECTED NOT DETECTED   Candida tropicalis NOT DETECTED NOT DETECTED   Cryptococcus neoformans/gattii NOT DETECTED NOT DETECTED   Methicillin resistance mecA/C NOT DETECTED NOT DETECTED    Jason Hudson 04/18/2023  4:09 AM

## 2023-04-18 NOTE — Progress Notes (Signed)
Progress Note   [name] ZOX:[id number] DOB: 1951/02/10 DOA: 04/12/2023     6 DOS: the patient was seen and examined on 04/18/2023   Brief hospital course: Taken from H&P.  Jason Hudson is a 72 y.o. male with medical history significant of chronic HFrEF with LVEF 40-45%, PAF not on anticoagulation due to history of severe GI bleed, CKD stage IV, COPD, IDDM with insulin resistance, gout, hypothyroidism, BPH, morbid obesity, presented with increasing shortness of breath and leg swelling and chest pain.   On presentation patient was hemodynamically stable.  Chest x-ray with eventration of right hemidiaphragm with some adjacent atelectasis.  No pulmonary edema.  Labs pertinent for AKI with creatinine at 3.1, baseline seems to be around 2.1-2.4, leukocytosis at 16.3.  Troponin 20 EKG sinus rhythm, chronic ST changes on multiple leads.  Patient was given 80 mg of IV Lasix in ED and started on IV diuresis. Chest pain appears to be musculoskeletal so he was given a trial of lidocaine patch and Flexeril.  8/22: Overnight persistent bandlike chest pain, which increased with body movements and deep breathing, D-dimer was checked and found to be elevated, patient also has CKD stage IV, VQ scan was ordered by night on-call. Repeat troponin 43, A1c 11.3, slight worsening of renal function with BUN 48 and creatinine 3.21, worsening leukocytosis at 20.6.  BNP normal. Holding IV Lasix today, procalcitonin 0.36, COVID PCR negative.  Significant orthopnea and unable to lay down for any scan.  [facility] cardiology was consulted.  Starting him on ceftriaxone and Zithromax for CAP coverage.  8/23: Patient overnight became more dyspneic and febrile at 103.  ABG with mild hypercapnia and hypoxia, due to increased work of breathing he was placed on BiPAP. Respiratory viral panel and MRSA PCR negative.  Worsening leukocytosis and procalcitonin today.  Urine has not been sent to lab despite  multiple reminders. Ordered blood and sputum culture and switching ceftriaxone with cefepime.  UOP of 2450 recorded, creatinine worsening at 3.27.  Cardiology gave another dose of IV Lasix 80 mg yesterday. Patient now meeting sepsis criteria likely secondary to pneumonia, still unable to obtain further imaging.  Ordered some IV fluid, holding Lasix.  8/24: Vital stable, VQ scan negative for PE, renal ultrasound without any acute abnormality.  Chest CT with cardiomegaly, bibasilar atelectasis, diffuse thyroid enlargement with recommendation of nonemergent thyroid ultrasound which can be done by his PCP.  Labs with improving leukocytosis, sodium 133, small improvement to creatinine at 3.12 today.  Remained on 8 L of oxygen.  Home dose of torsemide was restarted today.  8/25: Renal functions and leukocytosis improving, on 5 L of oxygen, UOP of 2100.  PT and OT recommending SNF.  TOC to find placement.  8/26: Hemodynamically stable, will complete a 5-day course of antibiotic today.  Continue to have some intermittent crampy lower chest pain, PE and ACS has been ruled out, likely musculoskeletal.  Patient can try muscle relaxant and supportive care. [facility].  8/27: 1/4 prior blood culture bottles started turning positive for staph lugdunensis, likely contaminant but patient did had leukocytosis and was febrile, was treated did for concern of pneumonia although there was no infiltrate on chest x-ray but he does has upper respiratory symptoms.  Discussed with ID and they recommend repeating blood culture and starting on cefazolin.  No hardware.  Repeating blood cultures.  Did obtain insurance authorization, ID was also consulted.  Assessment and Plan: * Sepsis due  to pneumonia [facility]) Patient now meeting sepsis criteria with fever, worsening leukocytosis, and tachypnea.  Likely secondary to pneumonia as there is no other source of infection BNP normal so less  likely a CHF exacerbation. Significant leukocytosis and procalcitonin 0.36>>2.02.  Pleuritic chest pain-resolved V/Q negative for PE, venous Doppler negative for DVT. #4 anaerobic blood cultures from 8/23 became positive after 4 days with staph lugdunensis, can be a contaminant but need to rule out -Completed a 5-day course of cefepime and Zithromax yesterday -Repeat blood culture -Started on cefazolin for now  Acute on chronic systolic congestive heart failure (HCC) Okay patient was initially admitted with concern of acute on chronic HFrEF, prior echocardiogram done in 2020 with EF of 40 to 45%.  Repeat echo with normal EF, indeterminate diastolic function. BNP normal and patient has chronic lower extremity edema with signs of chronic venous congestion and stasis.  Do not weigh himself regularly.  Chest x-ray without any pulmonary congestion.  Unable to check JVD due to body habitus. Received IV Lasix which resulted in worsening of creatinine. -Holding more IV diuresis -Restarting home torsemide as renal function now stable -Cardiology consult at his request  Leukocytosis Leukocytosis  Resolved.  Procalcitonin at 0.36>>2.02. Chest x-ray with questionable infiltrate/atelectasis. -Started him on ceftriaxone and Zithromax for CAP coverage, he became febrile with worsening leukocytosis, ceftriaxone is being switched to cefepime -Monitor CBC  Chest pain Improved Patient has atypical bandlike chest spasms, increased with any body movement and deep breathing.  Mildly positive troponin likely secondary to demand. -Echocardiogram with improved EF and no wall motion abnormalities  Essential hypertension Blood pressure within goal. -Continue home carvedilol, Cardura, Imdur.  Paroxysmal atrial fibrillation (HCC) Not on any anticoagulation due to history of GI bleed. -Continue amiodarone and aspirin  Chronic kidney disease, stage 4 (severe) (HCC) Likely AKI with history of CKD stage IV.   Creatinine slowly improving, at 2.88 today,   Baseline appears to be around 2.5.   Renal function started improving -Nephrology restarted home torsemide on 8/24 -Monitor renal function -Avoid nephrotoxins   Chronic obstructive pulmonary disease (HCC) No wheezing.  Slightly increased cough from his baseline. -Continue home bronchodilators -Patient likely is oxygen dependent, apparently had it at home but was not using regularly  Type 2 diabetes mellitus with chronic kidney disease, with long-term current use of insulin (HCC) Uncontrolled with hyperglycemia and A1c of 11.3.  CBG improved today  Patient is on significant amount of insulin at home. -Continue Semglee to 35 units twice daily instead of 60 daily. -Decreasing mealtime coverage to 5 unit -Continue SSI  Obesity, Class III, BMI 40-49.9 (morbid obesity) (HCC) Estimated body mass index is 48.66 kg/m as calculated from the following:   Height as of this encounter: 5\' 8"  (1.727 m).   Weight as of this encounter: 145.2 kg.   This will complicate overall prognosis Encouraged weight loss  Obstructive sleep apnea of adult - BiPAP at night, patient is mostly noncompliant at home   Subjective: Patient was sleeping comfortably in recliner when seen today.  Easily arousable.  Denies any new concern. Discussed about new positive blood culture, per patient he had a boil in his back couple of weeks ago which has been improved.  Having intermittent blistering which rupture in lower extremity due to chronic venous congestion.  Physical Exam: Vitals:   04/17/23 0820 04/17/23 1924 04/18/23 0354 04/18/23 0745  BP:  123/64 139/65 (!) 140/77  Pulse:  76 69 73  Resp:  18 20 18  Temp:  97.7 F (36.5 C) 98.1 F (36.7 C) 97.6 F (36.4 C)  TempSrc:  Oral  Oral  SpO2: 95% 97% 95% 99%  Weight:      Height:       General.  Morbidly obese gentleman, in no acute distress. Pulmonary.  Lungs clear bilaterally, normal respiratory effort. CV.   Regular rate and rhythm, no JVD, rub or murmur. Abdomen.  Soft, nontender, nondistended, BS positive. CNS.  Somnolent but arousable.  No focal neurologic deficit. Extremities.  Trace LE edema,  pulses intact and symmetrical. Signs of chronic venous stasis Psychiatry.  Judgment and insight appears normal.   Data Reviewed: Prior data reviewed  Family Communication: Discussed with patient, talked with sister on phone.  Disposition: Status is: Inpatient Remains inpatient appropriate because: Severity of illness  Planned Discharge Destination: Home  DVT prophylaxis.  Lovenox Time spent: 42 minutes  This record has been created using Conservation officer, historic buildings. Errors have been sought and corrected,but may not always be located. Such creation errors do not reflect on the standard of care.   Author: Arnetha Courser, MD 04/18/2023 1:13 PM  For on call review [email or web address].

## 2023-04-18 NOTE — TOC Progression Note (Signed)
Transition of Care [facility]) - Progression Note    Patient Details  Name: Jason Hudson MRN: [id number] Date of Birth: 1950/11/21  [facility]) CM/SW Contact  Chapman Fitch, RN Phone Number: 04/18/2023, 4:12 PM  Clinical Narrative:     Insurance auth for EMS transport denied  Patient notified.  Sister at bedside.  Sister and brother in law confirm they will be able to bring portable O2 and transport at discharge  Per MD will need repeat blood cultures Per Tammy at [facility] auth valid through this Friday at 5pm  Expected Discharge Plan: Home/Self Care Barriers to Discharge: Continued Medical Work up  Expected Discharge Plan and Services       Living arrangements for the past 2 months: Single Family Home                                       Social Determinants of Health (SDOH) Interventions SDOH Screenings   Food Insecurity: No Food Insecurity (04/13/2023)  Housing: Low Risk  (04/13/2023)  Transportation Needs: No Transportation Needs (04/13/2023)  Utilities: Not At Risk (04/13/2023)  Alcohol Screen: Low Risk  (05/11/2022)  Depression (PHQ2-9): Low Risk  (05/11/2022)  Tobacco Use: Low Risk  (04/13/2023)    Readmission Risk Interventions    04/14/2023   10:31 AM  Readmission Risk Prevention Plan  Transportation Screening Complete  PCP or Specialist Appt within 3-5 Days Complete  HRI or Home Care Consult Complete  Social Work Consult for Recovery Care Planning/Counseling Complete  Palliative Care Screening Not Complete  Medication Review Oceanographer) Complete

## 2023-04-18 NOTE — TOC Progression Note (Signed)
Transition of Care [facility]) - Progression Note    Patient Details  Name: Esa Sanderlin MRN: [id number] Date of Birth: 18-Sep-1950  [facility]) CM/SW Contact  Chapman Fitch, RN Phone Number: 04/18/2023, 9:26 AM  Clinical Narrative:    [facility] approved for for SNF auth ID [id number]  Auth for EMS pending.  Per Tammy at [facility] she will notify me of determination tomorrow    Expected Discharge Plan: Home/Self Care Barriers to Discharge: Continued Medical Work up  Expected Discharge Plan and Services       Living arrangements for the past 2 months: Single Family Home                                       Social Determinants of Health (SDOH) Interventions SDOH Screenings   Food Insecurity: No Food Insecurity (04/13/2023)  Housing: Low Risk  (04/13/2023)  Transportation Needs: No Transportation Needs (04/13/2023)  Utilities: Not At Risk (04/13/2023)  Alcohol Screen: Low Risk  (05/11/2022)  Depression (PHQ2-9): Low Risk  (05/11/2022)  Tobacco Use: Low Risk  (04/13/2023)    Readmission Risk Interventions    04/14/2023   10:31 AM  Readmission Risk Prevention Plan  Transportation Screening Complete  PCP or Specialist Appt within 3-5 Days Complete  HRI or Home Care Consult Complete  Social Work Consult for Recovery Care Planning/Counseling Complete  Palliative Care Screening Not Complete  Medication Review Oceanographer) Complete

## 2023-04-19 ENCOUNTER — Inpatient Hospital Stay: Payer: PPO

## 2023-04-19 ENCOUNTER — Encounter: Payer: PPO | Admitting: Family

## 2023-04-19 DIAGNOSIS — M4624 Osteomyelitis of vertebra, thoracic region: Secondary | ICD-10-CM

## 2023-04-19 DIAGNOSIS — B957 Other staphylococcus as the cause of diseases classified elsewhere: Secondary | ICD-10-CM | POA: Diagnosis not present

## 2023-04-19 DIAGNOSIS — M4644 Discitis, unspecified, thoracic region: Secondary | ICD-10-CM | POA: Diagnosis not present

## 2023-04-19 DIAGNOSIS — I5023 Acute on chronic systolic (congestive) heart failure: Secondary | ICD-10-CM | POA: Diagnosis not present

## 2023-04-19 DIAGNOSIS — J9811 Atelectasis: Secondary | ICD-10-CM | POA: Diagnosis not present

## 2023-04-19 DIAGNOSIS — J189 Pneumonia, unspecified organism: Secondary | ICD-10-CM | POA: Diagnosis not present

## 2023-04-19 DIAGNOSIS — D72829 Elevated white blood cell count, unspecified: Secondary | ICD-10-CM | POA: Diagnosis not present

## 2023-04-19 DIAGNOSIS — R079 Chest pain, unspecified: Secondary | ICD-10-CM | POA: Diagnosis not present

## 2023-04-19 LAB — BASIC METABOLIC PANEL
Anion gap: 15 (ref 5–15)
BUN: 96 mg/dL — ABNORMAL HIGH (ref 8–23)
CO2: 29 mmol/L (ref 22–32)
Calcium: 9.5 mg/dL (ref 8.9–10.3)
Chloride: 86 mmol/L — ABNORMAL LOW (ref 98–111)
Creatinine, Ser: 3.31 mg/dL — ABNORMAL HIGH (ref 0.61–1.24)
GFR, Estimated: 19 mL/min — ABNORMAL LOW (ref >60)
Glucose, Bld: 137 mg/dL — ABNORMAL HIGH (ref 70–99)
Potassium: 3.7 mmol/L (ref 3.5–5.1)
Sodium: 130 mmol/L — ABNORMAL LOW (ref 135–145)

## 2023-04-19 LAB — CBC WITH DIFFERENTIAL/PLATELET
Abs Immature Granulocytes: 0.92 10*3/uL — ABNORMAL HIGH (ref 0.00–0.07)
Basophils Absolute: 0.1 10*3/uL (ref 0.0–0.1)
Basophils Relative: 1 %
Eosinophils Absolute: 0.6 10*3/uL — ABNORMAL HIGH (ref 0.0–0.5)
Eosinophils Relative: 4 %
HCT: 35.1 % — ABNORMAL LOW (ref 39.0–52.0)
Hemoglobin: 11.6 g/dL — ABNORMAL LOW (ref 13.0–17.0)
Immature Granulocytes: 5 %
Lymphocytes Relative: 13 %
Lymphs Abs: 2.3 10*3/uL (ref 0.7–4.0)
MCH: 31 pg (ref 26.0–34.0)
MCHC: 33 g/dL (ref 30.0–36.0)
MCV: 93.9 fL (ref 80.0–100.0)
Monocytes Absolute: 1.9 10*3/uL — ABNORMAL HIGH (ref 0.1–1.0)
Monocytes Relative: 11 %
Neutro Abs: 11.6 10*3/uL — ABNORMAL HIGH (ref 1.7–7.7)
Neutrophils Relative %: 66 %
Platelets: 256 10*3/uL (ref 150–400)
RBC: 3.74 MIL/uL — ABNORMAL LOW (ref 4.22–5.81)
RDW: 13.9 % (ref 11.5–15.5)
Smear Review: NORMAL
WBC: 17.4 10*3/uL — ABNORMAL HIGH (ref 4.0–10.5)
nRBC: 0 % (ref 0.0–0.2)

## 2023-04-19 LAB — CULTURE, BLOOD (ROUTINE X 2)
Culture: NO GROWTH
Special Requests: ADEQUATE

## 2023-04-19 LAB — GLUCOSE, CAPILLARY
Glucose-Capillary: 142 mg/dL — ABNORMAL HIGH (ref 70–99)
Glucose-Capillary: 133 mg/dL — ABNORMAL HIGH (ref 70–99)
Glucose-Capillary: 96 mg/dL (ref 70–99)
Glucose-Capillary: 123 mg/dL — ABNORMAL HIGH (ref 70–99)

## 2023-04-19 MED ORDER — LORAZEPAM 2 MG/ML IJ SOLN
2.0000 mg | Freq: Once | INTRAMUSCULAR | Status: AC
  Administered 2023-04-19: 2 mg via INTRAVENOUS
  Filled 2023-04-19: qty 1

## 2023-04-19 NOTE — Plan of Care (Signed)
  Problem: Health Behavior/Discharge Planning: Goal: Ability to identify and utilize available resources and services will improve Outcome: Progressing   Problem: Coping: Goal: Ability to adjust to condition or change in health will improve Outcome: Progressing   Problem: Nutritional: Goal: Maintenance of adequate nutrition will improve Outcome: Progressing   Problem: Tissue Perfusion: Goal: Adequacy of tissue perfusion will improve Outcome: Progressing

## 2023-04-19 NOTE — Progress Notes (Signed)
[facility]  ROUNDING NOTE   Subjective:   Jason Hudson is a 72 y.o. male with past medical history of chronic systolic heart failure, atrial fibrillation on anticoagulation and chronic kidney disease stage IV.  Patient presents to the emergency department complaining of shortness of breath with chest pain and lower extremity edema.  Patient has been admitted for Shortness of breath [R06.02] CHF (congestive heart failure) (HCC) [I50.9] Edema, unspecified type [R60.9] Chest pain, unspecified type [R07.9]  Patient is known to our practice and is followed outpatient by Dr. Wynelle Link.  Patient was last seen in office on April 04, 2023 for routine follow-up.   Patient seen sitting up in chair, no family present Alert and oriented 3L [employer] in place  Remains short of breath on exertion.   Creatinine 3.31 (3.09) (3.2) Urine output in past 24 hours  Objective:  Vital signs in last 24 hours:  Temp:  [97.6 F (36.4 C)-98.5 F (36.9 C)] 98 F (36.7 C) (08/28 0750) Pulse Rate:  [71-73] 72 (08/28 0750) Resp:  [16-19] 18 (08/28 0750) BP: (105-117)/(53-69) 116/60 (08/28 0750) SpO2:  [96 %-100 %] 96 % (08/28 0750)  Weight change:  Filed Weights   04/13/23 0153 04/15/23 0500  Weight: (!) 145.2 kg (!) 147.8 kg    Intake/Output: I/O last 3 completed shifts: In: 1400 [P.O.:1200; IV Piggyback:200] Out: 4500 [Urine:4500]   Intake/Output this shift:  Total I/O In: 360 [P.O.:360] Out: -   Physical Exam: General: NAD, sitting in chair  Head: Normocephalic, atraumatic. Moist oral mucosal membranes  Eyes: Anicteric  Neck: Supple  Lungs:  Clear to auscultation, [place]  Heart: Regular rate and rhythm  Abdomen:  Soft, nontender, obese  Extremities: 1+  peripheral edema.  Neurologic: Nonfocal, moving all four extremities  Skin: No lesions, bilateral venous stasis  Access: None    Basic Metabolic Panel: Recent Labs  Lab 04/14/23 0515 04/15/23 0433 04/16/23 0543  04/17/23 0432 04/18/23 0939 04/19/23 0445  NA 136 133* 130* 134* 131* 130*  K 4.2 3.9 4.0 4.1 3.9 3.7  CL 89* 93* 90* 88* 90* 86*  CO2 30 31 28 30 30 29   GLUCOSE 129* 70 86 102* 183* 137*  BUN 57* 56* 63* 71* 90* 96*  CREATININE 3.27* 3.12* 2.88* 3.20* 3.09* 3.31*  CALCIUM 11.3* 9.9 9.6 10.1 9.3 9.5  MG 1.6*  --   --   --   --   --   PHOS  --   --   --  4.4  --   --     Liver Function Tests: Recent Labs  Lab 04/13/23 0148 04/17/23 0432  AST 31  --   ALT 25  --   ALKPHOS 81  --   BILITOT 0.9  --   PROT 8.3*  --   ALBUMIN 3.9 3.4*   Recent Labs  Lab 04/13/23 0148  LIPASE 28   No results for input(s): "AMMONIA" in the last 168 hours.  CBC: Recent Labs  Lab 04/13/23 0148 04/14/23 0515 04/15/23 0433 04/16/23 0543 04/19/23 0445  WBC 20.6* 31.1* 17.6* 14.3* 17.4*  NEUTROABS  --   --   --   --  11.6*  HGB 13.6 11.7* 11.3* 11.9* 11.6*  HCT 40.8 36.0* 33.6* 37.2* 35.1*  MCV 95.1 95.7 95.2 97.9 93.9  PLT 226 187 169 201 256    Cardiac Enzymes: Recent Labs  Lab 04/12/23 1535  CKTOTAL 82    BNP: Invalid input(s): "POCBNP"  CBG: Recent Labs  Lab 04/18/23 1151 04/18/23 1703 04/18/23 2037 04/19/23 0751 04/19/23 1156  GLUCAP 155* 166* 167* 142* 133*    Microbiology: Results for orders placed or performed during the hospital encounter of 04/12/23  SARS Coronavirus 2 by RT PCR (hospital order, performed in [facility] hospital lab) *cepheid single result test* Anterior Nasal Swab     Status: None   Collection Time: 04/13/23 10:53 AM   Specimen: Anterior Nasal Swab  Result Value Ref Range Status   SARS Coronavirus 2 by RT PCR NEGATIVE NEGATIVE Final    Comment: (NOTE) SARS-CoV-2 target nucleic acids are NOT DETECTED.  The SARS-CoV-2 RNA is generally detectable in upper and lower respiratory specimens during the acute phase of infection. The lowest concentration of SARS-CoV-2 viral copies this assay can detect is 250 copies / mL. A negative result does  not preclude SARS-CoV-2 infection and should not be used as the sole basis for treatment or other patient management decisions.  A negative result may occur with improper specimen collection / handling, submission of specimen other than nasopharyngeal swab, presence of viral mutation(s) within the areas targeted by this assay, and inadequate number of viral copies (<250 copies / mL). A negative result must be combined with clinical observations, patient history, and epidemiological information.  Fact Sheet for Patients:   RoadLapTop.co.za  Fact Sheet for Healthcare Providers: http://kim-miller.com/  This test is not yet approved or  cleared by the [place] FDA and has been authorized for detection and/or diagnosis of SARS-CoV-2 by FDA under an Emergency Use Authorization (EUA).  This EUA will remain in effect (meaning this test can be used) for the duration of the COVID-19 declaration under Section 564(b)(1) of the Act, 21 U.S.C. section 360bbb-3(b)(1), unless the authorization is terminated or revoked sooner.  Performed at [facility], [address] [place], [place] [address]   Respiratory (~20 pathogens) panel by PCR     Status: None   Collection Time: 04/14/23  2:30 AM   Specimen: Nasopharyngeal Swab; Respiratory  Result Value Ref Range Status   Adenovirus NOT DETECTED NOT DETECTED Final   Coronavirus 229E NOT DETECTED NOT DETECTED Final    Comment: (NOTE) The Coronavirus on the Respiratory Panel, DOES NOT test for the novel  Coronavirus (2019 nCoV)    Coronavirus HKU1 NOT DETECTED NOT DETECTED Final   Coronavirus NL63 NOT DETECTED NOT DETECTED Final   Coronavirus OC43 NOT DETECTED NOT DETECTED Final   Metapneumovirus NOT DETECTED NOT DETECTED Final   Rhinovirus / Enterovirus NOT DETECTED NOT DETECTED Final   Influenza A NOT DETECTED NOT DETECTED Final   Influenza B NOT DETECTED NOT DETECTED Final    Parainfluenza Virus 1 NOT DETECTED NOT DETECTED Final   Parainfluenza Virus 2 NOT DETECTED NOT DETECTED Final   Parainfluenza Virus 3 NOT DETECTED NOT DETECTED Final   Parainfluenza Virus 4 NOT DETECTED NOT DETECTED Final   Respiratory Syncytial Virus NOT DETECTED NOT DETECTED Final   Bordetella pertussis NOT DETECTED NOT DETECTED Final   Bordetella Parapertussis NOT DETECTED NOT DETECTED Final   Chlamydophila pneumoniae NOT DETECTED NOT DETECTED Final   Mycoplasma pneumoniae NOT DETECTED NOT DETECTED Final    Comment: Performed at [facility], [address] [place], [place] [address]  MRSA Next Gen by PCR, Nasal     Status: None   Collection Time: 04/14/23  2:30 AM   Specimen: Nasal Mucosa; Nasal Swab  Result Value Ref Range Status   MRSA by PCR Next Gen NOT DETECTED  NOT DETECTED Final    Comment: (NOTE) The GeneXpert MRSA Assay (FDA approved for NASAL specimens only), is one component of a comprehensive MRSA colonization surveillance program. It is not intended to diagnose MRSA infection nor to guide or monitor treatment for MRSA infections. Test performance is not FDA approved in patients less than 24 years old. Performed at [facility], [address] [place], [place] [address]   Culture, blood (Routine X 2) w Reflex to ID Panel     Status: None   Collection Time: 04/14/23  9:25 AM   Specimen: BLOOD  Result Value Ref Range Status   Specimen Description BLOOD BLOOD RIGHT HAND  Final   Special Requests   Final    BOTTLES DRAWN AEROBIC AND ANAEROBIC Blood Culture adequate volume   Culture   Final    NO GROWTH 5 DAYS Performed at [facility], [address], [place], [place] [address]    Report Status 04/19/2023 FINAL  Final  Culture, blood (Routine X 2) w Reflex to ID Panel     Status: None (Preliminary result)   Collection Time: 04/14/23  9:25 AM   Specimen: BLOOD LEFT HAND  Result Value Ref Range Status   Specimen Description   Final     BLOOD LEFT HAND Performed at [facility], [address] [place], [place] [address]    Special Requests   Final    BOTTLES DRAWN AEROBIC AND ANAEROBIC Blood Culture adequate volume Performed at [facility], [address] [place], [place] [address]    Culture  Setup Time   Final    GRAM POSITIVE COCCI ANAEROBIC BOTTLE ONLY CRITICAL RESULT CALLED TO, READ BACK BY AND VERIFIED WITH: JASON [email or web address]  04/17/13 RH Performed at [facility], [address] [place], [place] [address]    Culture GRAM POSITIVE COCCI  Final   Report Status PENDING  Incomplete  Blood Culture ID Panel (Reflexed)     Status: Abnormal   Collection Time: 04/14/23  9:25 AM  Result Value Ref Range Status   Enterococcus faecalis NOT DETECTED NOT DETECTED Final   Enterococcus Faecium NOT DETECTED NOT DETECTED Final   Listeria monocytogenes NOT DETECTED NOT DETECTED Final   Staphylococcus species DETECTED (A) NOT DETECTED Final    Comment: CRITICAL RESULT CALLED TO, READ BACK BY AND VERIFIED WITH: JASON [email or web address]  04/18/23 RH    Staphylococcus aureus (BCID) NOT DETECTED NOT DETECTED Final   Staphylococcus epidermidis NOT DETECTED NOT DETECTED Final   Staphylococcus lugdunensis DETECTED (A) NOT DETECTED Final    Comment: CRITICAL RESULT CALLED TO, READ BACK BY AND VERIFIED WITH: JASON [email or web address]  04/18/23 RH    Streptococcus species NOT DETECTED NOT DETECTED Final   Streptococcus agalactiae NOT DETECTED NOT DETECTED Final   Streptococcus pneumoniae NOT DETECTED NOT DETECTED Final   Streptococcus pyogenes NOT DETECTED NOT DETECTED Final   A.calcoaceticus-baumannii NOT DETECTED NOT DETECTED Final   Bacteroides fragilis NOT DETECTED NOT DETECTED Final   Enterobacterales NOT DETECTED NOT DETECTED Final   Enterobacter cloacae complex NOT DETECTED NOT DETECTED Final   Escherichia coli NOT DETECTED NOT DETECTED Final   Klebsiella aerogenes NOT DETECTED NOT DETECTED Final   Klebsiella  oxytoca NOT DETECTED NOT DETECTED Final   Klebsiella pneumoniae NOT DETECTED NOT DETECTED Final   Proteus species NOT DETECTED NOT DETECTED Final   Salmonella species NOT DETECTED NOT DETECTED Final   Serratia marcescens NOT DETECTED NOT DETECTED Final   Haemophilus influenzae NOT DETECTED NOT DETECTED  Final   Neisseria meningitidis NOT DETECTED NOT DETECTED Final   Pseudomonas aeruginosa NOT DETECTED NOT DETECTED Final   Stenotrophomonas maltophilia NOT DETECTED NOT DETECTED Final   Candida albicans NOT DETECTED NOT DETECTED Final   Candida auris NOT DETECTED NOT DETECTED Final   Candida glabrata NOT DETECTED NOT DETECTED Final   Candida krusei NOT DETECTED NOT DETECTED Final   Candida parapsilosis NOT DETECTED NOT DETECTED Final   Candida tropicalis NOT DETECTED NOT DETECTED Final   Cryptococcus neoformans/gattii NOT DETECTED NOT DETECTED Final   Methicillin resistance mecA/C NOT DETECTED NOT DETECTED Final    Comment: Performed at [facility], [address] [place], [place] [address]  Culture, blood (Routine X 2) w Reflex to ID Panel     Status: None (Preliminary result)   Collection Time: 04/18/23 10:47 AM   Specimen: BLOOD  Result Value Ref Range Status   Specimen Description BLOOD BLOOD RIGHT HAND  Final   Special Requests   Final    BOTTLES DRAWN AEROBIC AND ANAEROBIC Blood Culture results may not be optimal due to an excessive volume of blood received in culture bottles   Culture   Final    NO GROWTH < 24 HOURS Performed at [facility], [address], [place], [place] [address]    Report Status PENDING  Incomplete  Culture, blood (Routine X 2) w Reflex to ID Panel     Status: None (Preliminary result)   Collection Time: 04/18/23 10:55 AM   Specimen: BLOOD  Result Value Ref Range Status   Specimen Description BLOOD BLOOD LEFT HAND  Final   Special Requests   Final    BOTTLES DRAWN AEROBIC AND ANAEROBIC Blood Culture results may not be optimal due to  an excessive volume of blood received in culture bottles   Culture   Final    NO GROWTH < 24 HOURS Performed at [facility], [address] [place], [place] [address]    Report Status PENDING  Incomplete    Coagulation Studies: No results for input(s): "LABPROT", "INR" in the last 72 hours.   Urinalysis: No results for input(s): "COLORURINE", "LABSPEC", "PHURINE", "GLUCOSEU", "HGBUR", "BILIRUBINUR", "KETONESUR", "PROTEINUR", "UROBILINOGEN", "NITRITE", "LEUKOCYTESUR" in the last 72 hours.  Invalid input(s): "APPERANCEUR"    Imaging: MR THORACIC SPINE WO CONTRAST  Result Date: 04/19/2023 CLINICAL DATA:  72 year old male with bacteremia and persistent back pain, mid back pain. EXAM: MRI THORACIC SPINE WITHOUT CONTRAST TECHNIQUE: Multiplanar, multisequence MR imaging of the thoracic spine was performed. No intravenous contrast was administered. COMPARISON:  Chest CT 04/14/2023. FINDINGS: Limited cervical spine imaging: Straightening of cervical lordosis but overall the cervical spine degeneration appears to be mild for age. Thoracic spine segmentation:  Normal on the recent CT. Alignment: Stable with some straightening of thoracic kyphosis. No significant scoliosis or spondylolisthesis. Vertebrae: Abnormal T5 and T6 vertebral bodies with evidence of marrow edema and adjacent mild paraspinal soft tissue inflammation (series 21 images 5 through 16. Disc space loss there, but no vacuum disc on the recent CT. The disc space appears indistinct on T1 imaging. And there appear to be subtle endplate erosions adjacent to the disc. Bilateral facet joint fluid at that level, and also some increased STIR signal in the interspinous ligament there. Furthermore, abnormal epidural space at both of those levels more pronounced at T6 ventrally (series 23, image 19). Overall mild spinal and moderate bilateral T5, right T6 neural foraminal stenosis which is multifactorial. No convincing spinal cord mass  effect. Background  bone marrow signal within normal limits. Chronic degenerative endplate changes elsewhere but no other marrow edema or evidence of acute osseous abnormality. Cord: No spinal cord signal abnormality. Conus medullaris partially visible and appears to remain normal. Paraspinal and other soft tissues: Abnormal at T5-T6 as detailed above. But no discrete paraspinal fluid collection. Incidental trace layering pleural effusions. Incidental dermal cyst of the right upper back on series 21, image 4. Disc levels: In addition to the abnormal T5-T6 level described above there is widespread thoracic spine degeneration, including bulky chronic calcified disc or disc osteophyte complex at T1-T2 which effaces the ventral CSF there, although despite its size there is not high-grade spinal stenosis there. IMPRESSION: 1. Positive for strong evidence of T5-T6 Discitis Osteomyelitis on this noncontrast exam. Associated ventral epidural and also paraspinal phlegmon. No drainable fluid collection suspected. Mild subsequent spinal stenosis at both T5 and T6, and moderate multifactorial neural foraminal stenosis. No thoracic spinal cord compression or cord signal abnormality. 2. Elsewhere chronic thoracic spine degeneration. 3. Trace layering pleural effusions. Electronically Signed   By: Odessa Fleming M.D.   On: 04/19/2023 11:28     Medications:    sodium chloride      ceFAZolin (ANCEF) IV 2 g (04/19/23 0901)    allopurinol  100 mg Oral Daily   amiodarone  200 mg Oral Daily   aspirin EC  81 mg Oral Daily   carvedilol  6.25 mg Oral BID WC   doxazosin  4 mg Oral QHS   enoxaparin (LOVENOX) injection  0.5 mg/kg Subcutaneous Q24H   fluticasone  2 spray Each Nare BID   fluticasone furoate-vilanterol  1 puff Inhalation Daily   insulin aspart  0-20 Units Subcutaneous TID WC   insulin aspart  0-5 Units Subcutaneous QHS   insulin aspart  5 Units Subcutaneous TID WC   insulin glargine-yfgn  30 Units Subcutaneous BID    isosorbide mononitrate  30 mg Oral Daily   levothyroxine  112 mcg Oral Q0600   lidocaine  1 patch Transdermal Q24H   linagliptin  5 mg Oral Daily   living well with diabetes book   Does not apply Once   loratadine  10 mg Oral Daily   sodium chloride flush  3 mL Intravenous Q12H   spironolactone  25 mg Oral Daily   torsemide  40 mg Oral BID   sodium chloride, acetaminophen, albuterol, cyclobenzaprine, ipratropium, LORazepam, morphine injection, ondansetron (ZOFRAN) IV, oxyCODONE, sodium chloride flush, traZODone  Assessment/ Plan:  Mr. Margarita Javed is a 72 y.o.  male with past medical history of chronic systolic heart failure, atrial fibrillation on anticoagulation and chronic kidney disease stage IV.  Patient presents to the emergency department complaining of shortness of breath with chest pain and lower extremity edema.  Patient has been admitted for Shortness of breath [R06.02] CHF (congestive heart failure) (HCC) [I50.9] Edema, unspecified type [R60.9] Chest pain, unspecified type [R07.9]   Acute Kidney Injury on chronic kidney disease stage IV with baseline creatinine 2.5 and GFR of 27 on 03/20/23.  Acute kidney injury secondary to fluid overload and aggressive diuresis Chronic kidney disease is secondary to poor diabetic control Awaiting renal ultrasound.  No IV contrast exposure.  Diuretics held due to  increased creatinine.   Creatinine remains slightly elevated. Continue to have good urine output. Would consider IV diuretic today due to lung exam. Patient has received oral diuretic so will hold IV for now. Will continue to monitor.   Lab Results  Component Value  Date   CREATININE 3.31 (H) 04/19/2023   CREATININE 3.09 (H) 04/18/2023   CREATININE 3.20 (H) 04/17/2023    Intake/Output Summary (Last 24 hours) at 04/19/2023 1247 Last data filed at 04/19/2023 1053 Gross per 24 hour  Intake 1040 ml  Output 3725 ml  Net -2685 ml    2.  Acute on chronic diastolic heart  failure.  Echo completed this admission shows EF 55 to 60%.  Continue Torsemide   3. Anemia of chronic kidney disease Lab Results  Component Value Date   HGB 11.6 (L) 04/19/2023    Hemoglobin at desired goal  4. 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 Home regimen includes [place]. Most recent hemoglobin A1c is 11.3 on 04/13/23.   5. Hyponatremia likely secondary to volume status. Placed on fluid restriction and continue diuresis. Sodium 130 today.     LOS: 7 Leone Putman 8/28/202412:47 PM

## 2023-04-19 NOTE — Progress Notes (Addendum)
Date of Admission:  04/12/2023      ID: Jason Hudson is a 72 y.o. male  Principal Problem:   Sepsis due to pneumonia [facility]) Active Problems:   Essential hypertension   Obstructive sleep apnea of adult   Acute on chronic systolic congestive heart failure (HCC)   Type 2 diabetes mellitus with chronic kidney disease, with long-term current use of insulin (HCC)   Chronic kidney disease, stage 4 (severe) (HCC)   Chronic obstructive pulmonary disease (HCC)   Leukocytosis   Chest pain   Paroxysmal atrial fibrillation (HCC)   Obesity, Class III, BMI 40-49.9 (morbid obesity) (HCC)    Subjective: Resting in bed Underwent MRI of thoracic spine  Medications:   allopurinol  100 mg Oral Daily   amiodarone  200 mg Oral Daily   aspirin EC  81 mg Oral Daily   carvedilol  6.25 mg Oral BID WC   doxazosin  4 mg Oral QHS   enoxaparin (LOVENOX) injection  0.5 mg/kg Subcutaneous Q24H   fluticasone  2 spray Each Nare BID   fluticasone furoate-vilanterol  1 puff Inhalation Daily   insulin aspart  0-20 Units Subcutaneous TID WC   insulin aspart  0-5 Units Subcutaneous QHS   insulin aspart  5 Units Subcutaneous TID WC   insulin glargine-yfgn  30 Units Subcutaneous BID   isosorbide mononitrate  30 mg Oral Daily   levothyroxine  112 mcg Oral Q0600   lidocaine  1 patch Transdermal Q24H   linagliptin  5 mg Oral Daily   living well with diabetes book   Does not apply Once   loratadine  10 mg Oral Daily   sodium chloride flush  3 mL Intravenous Q12H   spironolactone  25 mg Oral Daily   torsemide  40 mg Oral BID    Objective: Vital signs in last 24 hours: Patient Vitals for the past 24 hrs:  BP Temp Temp src Pulse Resp SpO2  04/19/23 0750 116/60 98 F (36.7 C) Oral 72 18 96 %  04/19/23 0203 115/64 97.6 F (36.4 C) Oral 73 19 98 %  04/18/23 2036 117/69 97.8 F (36.6 C) Oral 72 19 96 %  04/18/23 1704 (!) 105/53 98.5 F (36.9 C) Oral 71 16 100 %      PHYSICAL EXAM:  General:  sleeping no distress Lungs: b/l air entry Heart: s1s2 Abdomen: Soft, obese Extremities: b/l lymphedema legs Skin: No rashes or lesions. Or bruising Neurologic: Grossly non-focal  Lab Results    Latest Ref Rng & Units 04/19/2023    4:45 AM 04/16/2023    5:43 AM 04/15/2023    4:33 AM  CBC  WBC 4.0 - 10.5 K/uL 17.4  14.3  17.6   Hemoglobin 13.0 - 17.0 g/dL 95.2  84.1  32.4   Hematocrit 39.0 - 52.0 % 35.1  37.2  33.6   Platelets 150 - 400 K/uL 256  201  169        Latest Ref Rng & Units 04/19/2023    4:45 AM 04/18/2023    9:39 AM 04/17/2023    4:32 AM  CMP  Glucose 70 - 99 mg/dL 401  027  253   BUN 8 - 23 mg/dL 96  90  71   Creatinine 0.61 - 1.24 mg/dL 6.64  4.03  4.74   Sodium 135 - 145 mmol/L 130  131  134   Potassium 3.5 - 5.1 mmol/L 3.7  3.9  4.1   Chloride 98 - 111  mmol/L 86  90  88   CO2 22 - 32 mmol/L 29  30  30    Calcium 8.9 - 10.3 mg/dL 9.5  9.3  45.4       Microbiology: Bc - staph lugdunensis Studies/Results: MR THORACIC SPINE WO CONTRAST  Result Date: 04/19/2023 CLINICAL DATA:  72 year old male with bacteremia and persistent back pain, mid back pain. EXAM: MRI THORACIC SPINE WITHOUT CONTRAST TECHNIQUE: Multiplanar, multisequence MR imaging of the thoracic spine was performed. No intravenous contrast was administered. COMPARISON:  Chest CT 04/14/2023. FINDINGS: Limited cervical spine imaging: Straightening of cervical lordosis but overall the cervical spine degeneration appears to be mild for age. Thoracic spine segmentation:  Normal on the recent CT. Alignment: Stable with some straightening of thoracic kyphosis. No significant scoliosis or spondylolisthesis. Vertebrae: Abnormal T5 and T6 vertebral bodies with evidence of marrow edema and adjacent mild paraspinal soft tissue inflammation (series 21 images 5 through 16. Disc space loss there, but no vacuum disc on the recent CT. The disc space appears indistinct on T1 imaging. And there appear to be subtle endplate erosions  adjacent to the disc. Bilateral facet joint fluid at that level, and also some increased STIR signal in the interspinous ligament there. Furthermore, abnormal epidural space at both of those levels more pronounced at T6 ventrally (series 23, image 19). Overall mild spinal and moderate bilateral T5, right T6 neural foraminal stenosis which is multifactorial. No convincing spinal cord mass effect. Background bone marrow signal within normal limits. Chronic degenerative endplate changes elsewhere but no other marrow edema or evidence of acute osseous abnormality. Cord: No spinal cord signal abnormality. Conus medullaris partially visible and appears to remain normal. Paraspinal and other soft tissues: Abnormal at T5-T6 as detailed above. But no discrete paraspinal fluid collection. Incidental trace layering pleural effusions. Incidental dermal cyst of the right upper back on series 21, image 4. Disc levels: In addition to the abnormal T5-T6 level described above there is widespread thoracic spine degeneration, including bulky chronic calcified disc or disc osteophyte complex at T1-T2 which effaces the ventral CSF there, although despite its size there is not high-grade spinal stenosis there. IMPRESSION: 1. Positive for strong evidence of T5-T6 Discitis Osteomyelitis on this noncontrast exam. Associated ventral epidural and also paraspinal phlegmon. No drainable fluid collection suspected. Mild subsequent spinal stenosis at both T5 and T6, and moderate multifactorial neural foraminal stenosis. No thoracic spinal cord compression or cord signal abnormality. 2. Elsewhere chronic thoracic spine degeneration. 3. Trace layering pleural effusions. Electronically Signed   By: Odessa Fleming M.D.   On: 04/19/2023 11:28     Assessment/Plan: pt presenting with cramping chest pain, also had back pain, midthoracic, fever after hospitalization WBC went upto 31 Staph lugdunensis bacteremia with T5-T6 discitis and osteomyelitis. Also  has ventral epidural phlegmon and paraspinal phlegmon. No drainable fluid collection Pt is on cefazolin now ( he had responded to ceftriaxone ) . Discussed with IR , there is no fluid to drain, and also his body habitus, and thoracic spine access difficulty makes it not possible to do any diagnostic procedure- as we have bacteria in blood which is staph lugdunensis will treat that and watch If blood culture neg tomorrow , can get pICc Will need 6 weeks of IV until 05/26/23   B/l lymphedema legs With trauma to the left toes Had a calf wound last week . Healed   CKD   CHF     Obesity hypoventilation syndrome   ? PAF on amiodarone?  Discussed the management with care team

## 2023-04-19 NOTE — Progress Notes (Signed)
Progress Note   [name] NIO:[id number] DOB: 10/18/1950 DOA: 04/12/2023     7 DOS: the patient was seen and examined on 04/19/2023   Brief hospital course: Taken from H&P.  Elisey Halberg is a 72 y.o. male with medical history significant of chronic HFrEF with LVEF 40-45%, PAF not on anticoagulation due to history of severe GI bleed, CKD stage IV, COPD, IDDM with insulin resistance, gout, hypothyroidism, BPH, morbid obesity, presented with increasing shortness of breath and leg swelling and chest pain.   On presentation patient was hemodynamically stable.  Chest x-ray with eventration of right hemidiaphragm with some adjacent atelectasis.  No pulmonary edema.  Labs pertinent for AKI with creatinine at 3.1, baseline seems to be around 2.1-2.4, leukocytosis at 16.3.  Troponin 20 EKG sinus rhythm, chronic ST changes on multiple leads.  Patient was given 80 mg of IV Lasix in ED and started on IV diuresis. Chest pain appears to be musculoskeletal so he was given a trial of lidocaine patch and Flexeril.  8/22: Overnight persistent bandlike chest pain, which increased with body movements and deep breathing, D-dimer was checked and found to be elevated, patient also has CKD stage IV, VQ scan was ordered by night on-call. Repeat troponin 43, A1c 11.3, slight worsening of renal function with BUN 48 and creatinine 3.21, worsening leukocytosis at 20.6.  BNP normal. Holding IV Lasix today, procalcitonin 0.36, COVID PCR negative.  Significant orthopnea and unable to lay down for any scan.  [facility] cardiology was consulted.  Starting him on ceftriaxone and Zithromax for CAP coverage.  8/23: Patient overnight became more dyspneic and febrile at 103.  ABG with mild hypercapnia and hypoxia, due to increased work of breathing he was placed on BiPAP. Respiratory viral panel and MRSA PCR negative.  Worsening leukocytosis and procalcitonin today.  Urine has not been sent to lab despite  multiple reminders. Ordered blood and sputum culture and switching ceftriaxone with cefepime.  UOP of 2450 recorded, creatinine worsening at 3.27.  Cardiology gave another dose of IV Lasix 80 mg yesterday. Patient now meeting sepsis criteria likely secondary to pneumonia, still unable to obtain further imaging.  Ordered some IV fluid, holding Lasix.  8/24: Vital stable, VQ scan negative for PE, renal ultrasound without any acute abnormality.  Chest CT with cardiomegaly, bibasilar atelectasis, diffuse thyroid enlargement with recommendation of nonemergent thyroid ultrasound which can be done by his PCP.  Labs with improving leukocytosis, sodium 133, small improvement to creatinine at 3.12 today.  Remained on 8 L of oxygen.  Home dose of torsemide was restarted today.  8/25: Renal functions and leukocytosis improving, on 5 L of oxygen, UOP of 2100.  PT and OT recommending SNF.  TOC to find placement.  8/26: Hemodynamically stable, will complete a 5-day course of antibiotic today.  Continue to have some intermittent crampy lower chest pain, PE and ACS has been ruled out, likely musculoskeletal.  Patient can try muscle relaxant and supportive care. [facility].  8/27: 1/4 prior blood culture bottles started turning positive for staph lugdunensis, likely contaminant but patient did had leukocytosis and was febrile, was treated did for concern of pneumonia although there was no infiltrate on chest x-ray but he does has upper respiratory symptoms.  Discussed with ID and they recommend repeating blood culture and starting on cefazolin.  No hardware.  Repeating blood cultures.  Did obtain insurance authorization, ID was also consulted.  8/28: Because of his persistent back  pain MRI thoracic spine was obtained and it came back positive for T5-T4 discitis, osteomyelitis, some associated epidural and paraspinal phlegmon but no drainable fluid collection.  Case was also  discussed with IR and they do not think that there is anything to drain at this time.  Patient will need at least 6 weeks of IV antibiotics-ID to decide.  If repeat blood cultures remain negative by tomorrow we will order PICC line and patient should be able to go to his rehab before Friday.  Assessment and Plan: * Sepsis due to pneumonia (HCC) Staphylococcus lugdunensis bacteremia. T5-T6 discitis and osteomyelitis. Patient now meeting sepsis criteria with fever, worsening leukocytosis, and tachypnea. 1/4 anaerobic blood cultures from 8/23 became positive after 4 days with staph lugdunensis, MRI thoracic spine concern of discitis and osteomyelitis at T5-T6 level but there is no drainable fluid to confirm. In initially received 5 days of cefepime and Zithromycin for concern of pneumonia Started on cefazolin due to positive blood cultures after 60 hours. Repeat blood cultures were sent on 04/18/2023-negative so far -Patient will need 6 weeks of IV antibiotics -If repeat blood cultures remain negative by tomorrow we will order PICC line  Acute on chronic systolic congestive heart failure (HCC) Okay patient was initially admitted with concern of acute on chronic HFrEF, prior echocardiogram done in 2020 with EF of 40 to 45%.  Repeat echo with normal EF, indeterminate diastolic function. BNP normal and patient has chronic lower extremity edema with signs of chronic venous congestion and stasis.  Do not weigh himself regularly.  Chest x-ray without any pulmonary congestion.  Unable to check JVD due to body habitus. Received IV Lasix which resulted in worsening of creatinine. -Holding more IV diuresis -Restarting home torsemide as renal function now stable -Cardiology consult at his request  Leukocytosis Worsening leukocytosis after improving.  Procalcitonin at 0.36>>2.02. Chest x-ray with questionable infiltrate/atelectasis. Likely secondary to bacteremia, received 5-day treatment of  pneumonia. Thoracic MRI with concern of T5-T6 discitis and osteomyelitis -Treatment as mentioned above  Chest pain Improved Patient has atypical bandlike chest spasms, increased with any body movement and deep breathing.  Mildly positive troponin likely secondary to demand. -Echocardiogram with improved EF and no wall motion abnormalities  Essential hypertension Blood pressure within goal. -Continue home carvedilol, Cardura, Imdur.  Paroxysmal atrial fibrillation (HCC) Not on any anticoagulation due to history of GI bleed. -Continue amiodarone and aspirin  Chronic kidney disease, stage 4 (severe) (HCC) Likely AKI with history of CKD stage IV.  Creatinine with slight worsening today,   Baseline appears to be around 2.5.   Renal function started improving -Nephrology restarted home torsemide on 8/24 -Monitor renal function -Avoid nephrotoxins   Chronic obstructive pulmonary disease (HCC) No wheezing.  Slightly increased cough from his baseline. -Continue home bronchodilators -Patient likely is oxygen dependent, apparently had it at home but was not using regularly  Type 2 diabetes mellitus with chronic kidney disease, with long-term current use of insulin (HCC) Uncontrolled with hyperglycemia and A1c of 11.3.  CBG improved today  Patient is on significant amount of insulin at home. -Continue Semglee to 35 units twice daily instead of 60 daily. -Decreasing mealtime coverage to 5 unit -Continue SSI  Obesity, Class III, BMI 40-49.9 (morbid obesity) (HCC) Estimated body mass index is 48.66 kg/m as calculated from the following:   Height as of this encounter: 5\' 8"  (1.727 m).   Weight as of this encounter: 145.2 kg.   This will complicate overall prognosis Encouraged weight loss  Obstructive sleep apnea of adult - BiPAP at night, patient is mostly noncompliant at home   Subjective: Patient was resting when seen today. No new concern.  Physical Exam: Vitals:   04/18/23  1704 04/18/23 2036 04/19/23 0203 04/19/23 0750  BP: (!) 105/53 117/69 115/64 116/60  Pulse: 71 72 73 72  Resp: 16 19 19 18   Temp: 98.5 F (36.9 C) 97.8 F (36.6 C) 97.6 F (36.4 C) 98 F (36.7 C)  TempSrc: Oral Oral Oral Oral  SpO2: 100% 96% 98% 96%  Weight:      Height:       General. Morbidly obese gentleman, In no acute distress. Pulmonary.  Lungs clear bilaterally, normal respiratory effort. CV.  Regular rate and rhythm, no JVD, rub or murmur. Abdomen.  Soft, nontender, nondistended, BS positive. CNS.  Alert and oriented .  No focal neurologic deficit. Extremities.  Trace LE edema,  pulses intact and symmetrical. Signs of chronic venous congestion. Psychiatry.  Judgment and insight appears normal.   Data Reviewed: Prior data reviewed  Family Communication: Discussed with patient, talked with sister on phone.  Disposition: Status is: Inpatient Remains inpatient appropriate because: Severity of illness  Planned Discharge Destination: Home  DVT prophylaxis.  Lovenox Time spent: 45 minutes  This record has been created using Conservation officer, historic buildings. Errors have been sought and corrected,but may not always be located. Such creation errors do not reflect on the standard of care.   Author: Arnetha Courser, MD 04/19/2023 2:38 PM  For on call review [email or web address].

## 2023-04-19 NOTE — Progress Notes (Signed)
[facility] HF Stewardship  PCP: Enid Baas, MD  PCP-Cardiologist: None  HPI: Jason Hudson is a 72 y.o. male with chronic HFrEF with LVEF previously 40-45%, PAF not on anticoagulation due to history of severe GI bleed, CKD stage IV, COPD, T2DM with insulin resistance, gout, hypothyroidism, BPH, morbid obesity  who presented with shortness of breath, leg swelling and chest pain. Underwent LHC in 2019 showing 70% stenosis of Ost 1st Sept, 50% of Ost 2nd Mrg, and 100% Mid RCA stenosis with collaterals treated with medical management. LVEDP at that time was ~17. TTE in 2019 showed LVEF of 50-55%, which decreased in 2020 to 40-45%. MRI in 02/2023 showed improvement in LVEF to 66%. Echocardiogram 04/13/23 showed LVEF of 55-60%.Troponin on presentation was minimally elevated and BNP was WNL. Symptoms due to pneumonia, which are being treated with azithromycin and cefepime. Found to have S. lugdunensis in 1/4 bottles being treated with cefazolin. MRI 8/28 showed T5-T6 osteomyelitis.  Pertinent Lab Values: Creatinine, Ser  Date Value Ref Range Status  04/19/2023 3.31 (H) 0.61 - 1.24 mg/dL Final   BUN  Date Value Ref Range Status  04/19/2023 96 (H) 8 - 23 mg/dL Final  08/65/7846 35 (H) 8 - 27 mg/dL Final   Potassium  Date Value Ref Range Status  04/19/2023 3.7 3.5 - 5.1 mmol/L Final   Sodium  Date Value Ref Range Status  04/19/2023 130 (L) 135 - 145 mmol/L Final  10/21/2020 137 134 - 144 mmol/L Final   B Natriuretic Peptide  Date Value Ref Range Status  04/12/2023 89.7 0.0 - 100.0 pg/mL Final    Comment:    Performed at [facility], [address] [place], [place] [address]   Magnesium  Date Value Ref Range Status  04/14/2023 1.6 (L) 1.7 - 2.4 mg/dL Final    Comment:    Performed at [facility], [address] [place], [place] [address]   Hgb A1c MFr Bld  Date Value Ref Range Status  04/13/2023 11.3 (H) 4.8 - 5.6 % Final    Comment:    (NOTE) Pre  diabetes:          5.7%-6.4%  Diabetes:              >6.4%  Glycemic control for   <7.0% adults with diabetes    TSH  Date Value Ref Range Status  10/21/2020 16.300 (H) 0.450 - 4.500 uIU/mL Final    Vital Signs: Temp:  [97.6 F (36.4 C)-98.5 F (36.9 C)] 98 F (36.7 C) (08/28 0750) Pulse Rate:  [71-73] 72 (08/28 0750) Cardiac Rhythm: Heart block (08/28 0910) Resp:  [16-19] 18 (08/28 0750) BP: (105-117)/(53-69) 116/60 (08/28 0750) SpO2:  [96 %-100 %] 96 % (08/28 0750)   Intake/Output Summary (Last 24 hours) at 04/19/2023 1337 Last data filed at 04/19/2023 1053 Gross per 24 hour  Intake 1040 ml  Output 3725 ml  Net -2685 ml    Current Inpatient Medications:  Loop Diuretic: Torsemide 40 mg BID Beta Blocker: Carvedilol 6.25 mg BID     Mineralocorticoid Receptor Antagonist (MRA): Spironolactone 25 mg daily     Other vasoactive medications include isosorbide mononitrate 30 mg daily  Prior to admission HF Medications:  Carvedilol 6.25 mg BID Torsemide 80 mg daily Metolazone 2.5 mg as needed  Assessment: 1. Chronic heart failure (LVEF 40-45% in 2020, now improved to 55-60%), due to NICM.  -Remains on 3L O2. Symptoms are not due to HF given normal BNP. -Creatinine and BUN continue  to increase. Good urine output. Creatinine had improved after holding a dose of diuretics 8/26, but rose again when transitioned to BID.  -BP WNL this AM after adding spironolactone. Caution spironolactone in creatinine >2.5. Hesitant to increase carvedilol given COPD. Hesistant to add back ARB/ARNI given renal function.  -A1c of 11.3. Can consider SGLT2i when glucose is controlled to prevent UTI and pneumonia is treated to prevent euglycemic DKA.  Plan: 1) Medication changes recommended at this time: -None  2) Patient assistance: Sherryll Burger copay is [id number] (in Medicare coverage gap) -Farxiga copay $139.63 (in Medicare coverage gap) -Patient assistance for Jardiance initiated.  3)  Education: -To be completed prior to discharge.  Medication Assistance / Insurance Benefits Check:  Does the patient have prescription insurance?    Type of insurance plan:   Does the patient qualify for medication assistance through manufacturers or grants? Pending. Submitted Jardiance patient assistance at request of nephrology. Will have them sign the form, then submit. Nephrology will manage Jardiance outpatient.  Outpatient Pharmacy:  Prior to admission outpatient pharmacy: Pending     Thank you for involving pharmacy in this patient's care.  Enos Fling, PharmD, BCPS Phone - [phone] Clinical Pharmacist 04/19/2023 1:37 PM

## 2023-04-19 NOTE — Progress Notes (Signed)
Mobility Specialist - Progress Note   04/19/23 1404  Mobility  Activity Dangled on edge of bed;Stood at bedside;Ambulated with assistance in room  Level of Assistance Standby assist, set-up cues, supervision of patient - no hands on  Assistive Device Front wheel walker  Distance Ambulated (ft) 4 ft  Activity Response Tolerated well  $Mobility charge 1 Mobility  Mobility Specialist Start Time (ACUTE ONLY) 1329  Mobility Specialist Stop Time (ACUTE ONLY) 1402  Mobility Specialist Time Calculation (min) (ACUTE ONLY) 33 min   Pt supine upon entry, utilizing 3L [place]. Pt completed bed mob HHA for trunk support, able to self support trunk upon sitting EOB. Pt STS to RW Mod-MinA, took three forward steps -- NT completed bath. Pt took three backwards steps and two lateral steps before returning to bed. Pt required MaxA of BLE to return supine, left with alarm set and needs within reach.   Zetta Bills Mobility Specialist 04/19/23 2:15 PM

## 2023-04-19 NOTE — Progress Notes (Signed)
PT Cancellation Note  Patient Details Name: [name] MRN: [id number] DOB: Feb 20, 1951   Cancelled Treatment:    Reason Eval/Treat Not Completed: Other (comment) Pt reports that he has had a busy day and already been up with OT and mobility techs earlier.  He was pleasant but reports that he does not wish to get up again.  Not interested in sitting, says "My back isn't hurting now, I don't want to ruin it."  Will maintain on caseload and continue to see as appropriate.    Malachi Pro, DPT 04/19/2023, 3:54 PM

## 2023-04-19 NOTE — Progress Notes (Signed)
Occupational Therapy Treatment Patient Details Name: Jason Hudson MRN: [id number] DOB: 1950/10/06 Today's Date: 04/19/2023   History of present illness Kaizer Thome is a 72 y.o. male with a history of hypertension, diabetes, CKD, CAD, dismal atrial fibrillation, CHF, amyloid cyst, and COPD who presents to hospital with sepsis due to pneumonia.   OT comments  Chart reviewed to date, pt greeted in bed, agreeable to OT evaluation with nurse proving clearance. Tx session targeted improving functional activity tolerance during ADL tasks/mobility to improve Adl independence. Good static sitting for approx 10 minutes, MOD A required for urinating in standing with urinal, CGA for lateral steps up the bed. Pt completed 5 STS with MOD A from regular bed height with multi modal cueing for technique. Pt is making progress towards goals, discharge recommendation remains appropriate. OT will follow acutely.       If plan is discharge home, recommend the following:  A little help with walking and/or transfers;A lot of help with bathing/dressing/bathroom;Assistance with cooking/housework;Assist for transportation;Help with stairs or ramp for entrance   Equipment Recommendations  BSC/3in1    Recommendations for Other Services      Precautions / Restrictions         Mobility Bed Mobility Overal bed mobility: Needs Assistance Bed Mobility: Supine to Sit, Sit to Supine     Supine to sit: Mod assist, HOB elevated Sit to supine: Max assist, HOB elevated   General bed mobility comments: multi modal cues for technique; MAX A +2 for boost up the bed    Transfers Overall transfer level: Needs assistance Equipment used: Rolling walker (2 wheels), None (bariatric) Transfers: Sit to/from Stand Sit to Stand: Mod assist           General transfer comment: 5 attempts with bed at regular height with MOD A, frequent multi modal cues for technique; three lateral steps up the bed with  CGA to the right     Balance Overall balance assessment: Needs assistance Sitting-balance support: No upper extremity supported, Feet supported Sitting balance-Leahy Scale: Good     Standing balance support: Bilateral upper extremity supported, During functional activity, Reliant on assistive device for balance Standing balance-Leahy Scale: Fair                             ADL either performed or assessed with clinical judgement   ADL Overall ADL's : Needs assistance/impaired     Grooming: Wash/dry face;Sitting;Supervision/safety               Lower Body Dressing: Maximal assistance Lower Body Dressing Details (indicate cue type and reason): socks     Toileting- Clothing Manipulation and Hygiene: Moderate assistance Toileting - Clothing Manipulation Details (indicate cue type and reason): to urinate standing at bedside with urinal            Extremity/Trunk Assessment              Vision       Perception     Praxis      Cognition Arousal: Alert Behavior During Therapy: WFL for tasks assessed/performed Overall Cognitive Status: Impaired/Different from baseline Area of Impairment: Problem solving, Following commands                       Following Commands: Follows one step commands with increased time     Problem Solving: Slow processing          Exercises  Shoulder Instructions       General Comments edma noted throughout BLE, spo2 >90% on 3 L via [place], HR in the 70s following mobility    Pertinent Vitals/ Pain       Pain Assessment Pain Assessment: Faces Faces Pain Scale: Hurts little more Pain Location: back with mobility Pain Descriptors / Indicators: Aching, Constant, Spasm Pain Intervention(s): Limited activity within patient's tolerance, Monitored during session, Repositioned  Home Living                                          Prior Functioning/Environment               Frequency  Min 1X/week        Progress Toward Goals  OT Goals(current goals can now be found in the care plan section)  Progress towards OT goals: Progressing toward goals     Plan      Co-evaluation                 AM-PAC OT "6 Clicks" Daily Activity     Outcome Measure     Help from another person taking care of personal grooming?: A Little Help from another person toileting, which includes using toliet, bedpan, or urinal?: A Lot Help from another person bathing (including washing, rinsing, drying)?: A Lot Help from another person to put on and taking off regular upper body clothing?: A Little Help from another person to put on and taking off regular lower body clothing?: A Lot 6 Click Score: 12    End of Session Equipment Utilized During Treatment: Oxygen;Other (comment) (BRW)  OT Visit Diagnosis: Other abnormalities of gait and mobility (R26.89);Unsteadiness on feet (R26.81)   Activity Tolerance Patient tolerated treatment well   Patient Left in bed;with call bell/phone within reach;with bed alarm set   Nurse Communication Mobility status        Time: 1610-9604 OT Time Calculation (min): 30 min  Charges: OT General Charges $OT Visit: 1 Visit OT Treatments $Self Care/Home Management : 8-22 mins $Therapeutic Activity: 8-22 mins  Oleta Mouse, OTD OTR/L  04/19/23, 12:34 PM

## 2023-04-20 ENCOUNTER — Other Ambulatory Visit: Payer: Self-pay

## 2023-04-20 DIAGNOSIS — M4644 Discitis, unspecified, thoracic region: Secondary | ICD-10-CM | POA: Diagnosis not present

## 2023-04-20 DIAGNOSIS — B957 Other staphylococcus as the cause of diseases classified elsewhere: Secondary | ICD-10-CM | POA: Diagnosis not present

## 2023-04-20 DIAGNOSIS — M4620 Osteomyelitis of vertebra, site unspecified: Secondary | ICD-10-CM

## 2023-04-20 DIAGNOSIS — J9811 Atelectasis: Secondary | ICD-10-CM | POA: Diagnosis not present

## 2023-04-20 DIAGNOSIS — D72829 Elevated white blood cell count, unspecified: Secondary | ICD-10-CM | POA: Diagnosis not present

## 2023-04-20 DIAGNOSIS — R079 Chest pain, unspecified: Secondary | ICD-10-CM | POA: Diagnosis not present

## 2023-04-20 DIAGNOSIS — I5023 Acute on chronic systolic (congestive) heart failure: Secondary | ICD-10-CM | POA: Diagnosis not present

## 2023-04-20 DIAGNOSIS — M4624 Osteomyelitis of vertebra, thoracic region: Secondary | ICD-10-CM | POA: Diagnosis not present

## 2023-04-20 DIAGNOSIS — J189 Pneumonia, unspecified organism: Secondary | ICD-10-CM | POA: Diagnosis not present

## 2023-04-20 LAB — GLUCOSE, CAPILLARY
Glucose-Capillary: 140 mg/dL — ABNORMAL HIGH (ref 70–99)
Glucose-Capillary: 174 mg/dL — ABNORMAL HIGH (ref 70–99)
Glucose-Capillary: 104 mg/dL — ABNORMAL HIGH (ref 70–99)
Glucose-Capillary: 120 mg/dL — ABNORMAL HIGH (ref 70–99)

## 2023-04-20 LAB — RENAL FUNCTION PANEL
Albumin: 3.2 g/dL — ABNORMAL LOW (ref 3.5–5.0)
Anion gap: 15 (ref 5–15)
BUN: 92 mg/dL — ABNORMAL HIGH (ref 8–23)
CO2: 28 mmol/L (ref 22–32)
Calcium: 9.2 mg/dL (ref 8.9–10.3)
Chloride: 87 mmol/L — ABNORMAL LOW (ref 98–111)
Creatinine, Ser: 3.38 mg/dL — ABNORMAL HIGH (ref 0.61–1.24)
GFR, Estimated: 19 mL/min — ABNORMAL LOW (ref >60)
Glucose, Bld: 151 mg/dL — ABNORMAL HIGH (ref 70–99)
Phosphorus: 4.7 mg/dL — ABNORMAL HIGH (ref 2.5–4.6)
Potassium: 3.7 mmol/L (ref 3.5–5.1)
Sodium: 130 mmol/L — ABNORMAL LOW (ref 135–145)

## 2023-04-20 LAB — CULTURE, BLOOD (ROUTINE X 2): Special Requests: ADEQUATE

## 2023-04-20 LAB — CK: Total CK: 181 U/L (ref 49–397)

## 2023-04-20 MED ORDER — SODIUM CHLORIDE 0.9 % IV SOLN
2.0000 g | INTRAVENOUS | Status: DC
  Administered 2023-04-20: 2 g via INTRAVENOUS
  Filled 2023-04-20 (×2): qty 20

## 2023-04-20 MED ORDER — HYDROMORPHONE HCL 1 MG/ML IJ SOLN: 1.0000 mg | Freq: Once | INTRAMUSCULAR | Status: DC | PRN

## 2023-04-20 MED ORDER — SODIUM CHLORIDE 0.9 % IV SOLN
8.0000 mg/kg | INTRAVENOUS | Status: DC
  Administered 2023-04-20: 800 mg via INTRAVENOUS
  Filled 2023-04-20: qty 16

## 2023-04-20 MED ORDER — FAMOTIDINE 20 MG PO TABS: 40.0000 mg | ORAL_TABLET | Freq: Once | ORAL | Status: DC | PRN

## 2023-04-20 MED ORDER — METHYLPREDNISOLONE SODIUM SUCC 125 MG IJ SOLR: 125.0000 mg | Freq: Once | INTRAMUSCULAR | Status: DC | PRN

## 2023-04-20 MED ORDER — MAGNESIUM HYDROXIDE 400 MG/5ML PO SUSP
30.0000 mL | Freq: Every day | ORAL | Status: DC | PRN
  Administered 2023-04-20: 30 mL via ORAL
  Filled 2023-04-20: qty 30

## 2023-04-20 MED ORDER — ONDANSETRON HCL 4 MG/2ML IJ SOLN: 4.0000 mg | Freq: Four times a day (QID) | INTRAMUSCULAR | Status: DC | PRN

## 2023-04-20 MED ORDER — FENTANYL CITRATE PF 50 MCG/ML IJ SOSY: 12.5000 ug | PREFILLED_SYRINGE | Freq: Once | INTRAMUSCULAR | Status: DC | PRN

## 2023-04-20 MED ORDER — SODIUM CHLORIDE 0.9 % IV SOLN
8.0000 mg/kg | Freq: Every day | INTRAVENOUS | Status: DC
  Filled 2023-04-20: qty 16

## 2023-04-20 MED ORDER — SODIUM CHLORIDE 0.9 % IV SOLN: INTRAVENOUS | Status: DC

## 2023-04-20 MED ORDER — CEFAZOLIN SODIUM-DEXTROSE 2-4 GM/100ML-% IV SOLN
2.0000 g | INTRAVENOUS | Status: AC
  Administered 2023-04-21: 2 g via INTRAVENOUS

## 2023-04-20 MED ORDER — ENOXAPARIN SODIUM 30 MG/0.3ML IJ SOSY
30.0000 mg | PREFILLED_SYRINGE | INTRAMUSCULAR | Status: DC
  Administered 2023-04-20 – 2023-04-21 (×2): 30 mg via SUBCUTANEOUS
  Filled 2023-04-20: qty 0.3

## 2023-04-20 MED ORDER — MIDAZOLAM HCL 2 MG/ML PO SYRP: 8.0000 mg | ORAL_SOLUTION | Freq: Once | ORAL | Status: DC | PRN

## 2023-04-20 MED ORDER — DIPHENHYDRAMINE HCL 50 MG/ML IJ SOLN: 50.0000 mg | Freq: Once | INTRAMUSCULAR | Status: DC | PRN

## 2023-04-20 MED ORDER — DOCUSATE SODIUM 100 MG PO CAPS: 100.0000 mg | ORAL_CAPSULE | Freq: Two times a day (BID) | ORAL | Status: DC | PRN

## 2023-04-20 MED ORDER — TORSEMIDE 20 MG PO TABS
40.0000 mg | ORAL_TABLET | Freq: Every day | ORAL | Status: DC
  Administered 2023-04-21: 40 mg via ORAL
  Filled 2023-04-20: qty 2

## 2023-04-20 NOTE — Progress Notes (Signed)
This Chap responded to page and meet Pt and family at bedside. Family requested support on completing a living will "a type needed for disposal of remains." I informed the family that we do not cover this type of will in the Hospital and I pointed them to online Notaries as a possible way out. Mirna Mires provided active listening and a prayer as requested by patient.   04/20/23 1400  Spiritual Encounters  Type of Visit Initial  Care provided to: Pt and family  Conversation partners present during encounter Nurse  Referral source Family  Reason for visit Goals of care meeting  OnCall Visit Yes  Spiritual Framework  Presenting Themes Goals in life/care;Coping tools  Community/Connection Family  Interventions  Spiritual Care Interventions Made Established relationship of care and support;Compassionate presence;Decision-making support/facilitation  Intervention Outcomes  Outcomes Awareness of support;Reduced isolation;Connection to spiritual care  Spiritual Care Plan  Spiritual Care Issues Still Outstanding No further spiritual care needs at this time (see row info)

## 2023-04-20 NOTE — Progress Notes (Signed)
[facility] HF Stewardship  PCP: Enid Baas, MD  PCP-Cardiologist: None  HPI: Jason Hudson is a 72 y.o. male with chronic HFrEF with LVEF previously 40-45%, PAF not on anticoagulation due to history of severe GI bleed, CKD stage IV, COPD, T2DM with insulin resistance, gout, hypothyroidism, BPH, morbid obesity  who presented with shortness of breath, leg swelling and chest pain. Underwent LHC in 2019 showing 70% stenosis of Ost 1st Sept, 50% of Ost 2nd Mrg, and 100% Mid RCA stenosis with collaterals treated with medical management. LVEDP at that time was ~17. TTE in 2019 showed LVEF of 50-55%, which decreased in 2020 to 40-45%. MRI in 02/2023 showed improvement in LVEF to 66%. Echocardiogram 04/13/23 showed LVEF of 55-60%.Troponin on presentation was minimally elevated and BNP was WNL. Symptoms due to pneumonia, which are being treated with azithromycin and cefepime. Found to have S. lugdunensis in 1/4 bottles being treated with cefazolin. MRI 8/28 showed T5-T6 osteomyelitis.  Pertinent Lab Values: Creatinine, Ser  Date Value Ref Range Status  04/20/2023 3.38 (H) 0.61 - 1.24 mg/dL Final   BUN  Date Value Ref Range Status  04/20/2023 92 (H) 8 - 23 mg/dL Final    Comment:    RESULT CONFIRMED BY MANUAL DILUTION MW  10/21/2020 35 (H) 8 - 27 mg/dL Final   Potassium  Date Value Ref Range Status  04/20/2023 3.7 3.5 - 5.1 mmol/L Final   Sodium  Date Value Ref Range Status  04/20/2023 130 (L) 135 - 145 mmol/L Final  10/21/2020 137 134 - 144 mmol/L Final   B Natriuretic Peptide  Date Value Ref Range Status  04/12/2023 89.7 0.0 - 100.0 pg/mL Final    Comment:    Performed at [facility], [address] [place], [place] [address]   Magnesium  Date Value Ref Range Status  04/14/2023 1.6 (L) 1.7 - 2.4 mg/dL Final    Comment:    Performed at [facility], [address] [place], [place] [address]   Hgb A1c MFr Bld  Date Value Ref Range Status  04/13/2023  11.3 (H) 4.8 - 5.6 % Final    Comment:    (NOTE) Pre diabetes:          5.7%-6.4%  Diabetes:              >6.4%  Glycemic control for   <7.0% adults with diabetes    TSH  Date Value Ref Range Status  10/21/2020 16.300 (H) 0.450 - 4.500 uIU/mL Final    Vital Signs: Temp:  [97.7 F (36.5 C)-98.3 F (36.8 C)] 97.7 F (36.5 C) (08/29 0734) Pulse Rate:  [72-75] 75 (08/29 0734) Cardiac Rhythm: Normal sinus rhythm;Heart block (08/28 1903) Resp:  [18-20] 20 (08/29 0734) BP: (104-138)/(53-70) 138/69 (08/29 0734) SpO2:  [92 %-99 %] 99 % (08/29 0734)   Intake/Output Summary (Last 24 hours) at 04/20/2023 0756 Last data filed at 04/20/2023 0746 Gross per 24 hour  Intake 840 ml  Output 3750 ml  Net -2910 ml    Current Inpatient Medications:  Loop Diuretic: Torsemide 40 mg BID Beta Blocker: Carvedilol 6.25 mg BID     Mineralocorticoid Receptor Antagonist (MRA): Spironolactone 25 mg daily     Other vasoactive medications include isosorbide mononitrate 30 mg daily  Prior to admission HF Medications:  Carvedilol 6.25 mg BID Torsemide 80 mg daily Metolazone 2.5 mg as needed  Assessment: 1. Chronic heart failure (LVEF 40-45% in 2020, now improved to 55-60%), due to NICM.  -Remains on  intermittent O2. Walked this AM with stable O2 saturations. Residual respiratory symptoms likely due to COPD. Given COPD, would benefit from adding LAMA. -Creatinine and BUN continue to trend up. Good urine output.  -BP WNL this AM after adding spironolactone. Caution spironolactone in creatinine >2.5. Hesitant to increase carvedilol given COPD. Hesistant to add back ARB/ARNI given renal function.  -If SGLT2i is added, diuretics will require adjustment to prevent hypovolemia.  Plan: 1) Medication changes recommended at this time: -None  2) Patient assistance: Sherryll Burger copay is [phone] (in Medicare coverage gap) -Farxiga copay $139.63 (in Medicare coverage gap) -Patient assistance for Jardiance  initiated.   3) Education: -- Patient has been educated on current HF medications and potential additions to HF medication regimen - Patient verbalizes understanding that over the next few months, these medication doses may change and more medications may be added to optimize HF regimen - Patient has been educated on basic disease state pathophysiology and goals of therapy  Medication Assistance / Insurance Benefits Check:  Does the patient have prescription insurance?    Type of insurance plan:   Does the patient qualify for medication assistance through manufacturers or grants?  Submitted Jardiance patient assistance at request of nephrology. Will have them sign the form, then submit. Nephrology will manage Jardiance outpatient.  Outpatient Pharmacy:  Prior to admission outpatient pharmacy: [facility]     Thank you for involving pharmacy in this patient's care.  Enos Fling, PharmD, BCPS Phone - [phone] Clinical Pharmacist 04/20/2023 7:56 AM

## 2023-04-20 NOTE — Progress Notes (Signed)
PICC order received. Secure chat sent to the provider, Nephrologist and ID doctor. We prefer a [place], tunneled access per Wendee Beavers NP.

## 2023-04-20 NOTE — Progress Notes (Signed)
Date of Admission:  04/12/2023      ID: Jason Hudson is a 72 y.o. male  Principal Problem:   Sepsis due to pneumonia [facility]) Active Problems:   Essential hypertension   Obstructive sleep apnea of adult   Acute on chronic systolic congestive heart failure (HCC)   Type 2 diabetes mellitus with chronic kidney disease, with long-term current use of insulin (HCC)   Chronic kidney disease, stage 4 (severe) (HCC)   Chronic obstructive pulmonary disease (HCC)   Leukocytosis   Chest pain   Paroxysmal atrial fibrillation (HCC)   Obesity, Class III, BMI 40-49.9 (morbid obesity) (HCC)   Coag negative Staphylococcus bacteremia   Discitis thoracic region    Subjective: Says he is feeling better Back pain better Sister and BIL at bed side  Medications:   allopurinol  100 mg Oral Daily   amiodarone  200 mg Oral Daily   aspirin EC  81 mg Oral Daily   carvedilol  6.25 mg Oral BID WC   doxazosin  4 mg Oral QHS   enoxaparin (LOVENOX) injection  30 mg Subcutaneous Q24H   fluticasone  2 spray Each Nare BID   fluticasone furoate-vilanterol  1 puff Inhalation Daily   insulin aspart  0-20 Units Subcutaneous TID WC   insulin aspart  0-5 Units Subcutaneous QHS   insulin aspart  5 Units Subcutaneous TID WC   insulin glargine-yfgn  30 Units Subcutaneous BID   isosorbide mononitrate  30 mg Oral Daily   levothyroxine  112 mcg Oral Q0600   lidocaine  1 patch Transdermal Q24H   linagliptin  5 mg Oral Daily   living well with diabetes book   Does not apply Once   loratadine  10 mg Oral Daily   sodium chloride flush  3 mL Intravenous Q12H   spironolactone  25 mg Oral Daily   [START ON 04/21/2023] torsemide  40 mg Oral Daily    Objective: Vital signs in last 24 hours: Patient Vitals for the past 24 hrs:  BP Temp Temp src Pulse Resp SpO2  04/20/23 1515 123/60 98.6 F (37 C) Oral 76 19 96 %  04/20/23 0734 138/69 97.7 F (36.5 C) Oral 75 20 99 %  04/20/23 0206 128/70 98.3 F (36.8 C)  Oral 73 20 92 %  04/19/23 2000 (!) 104/53 97.9 F (36.6 C) Oral 72 18 98 %      PHYSICAL EXAM:  General: awake and alert Lungs: b/l air entry Heart: s1s2 Back- dusky flat nodule no tenderness no discharge  Abdomen: Soft, obese Extremities: b/l lymphedema legs Skin: No rashes or lesions. Or bruising Neurologic: Grossly non-focal  Lab Results    Latest Ref Rng & Units 04/19/2023    4:45 AM 04/16/2023    5:43 AM 04/15/2023    4:33 AM  CBC  WBC 4.0 - 10.5 K/uL 17.4  14.3  17.6   Hemoglobin 13.0 - 17.0 g/dL 86.5  78.4  69.6   Hematocrit 39.0 - 52.0 % 35.1  37.2  33.6   Platelets 150 - 400 K/uL 256  201  169        Latest Ref Rng & Units 04/20/2023    4:40 AM 04/19/2023    4:45 AM 04/18/2023    9:39 AM  CMP  Glucose 70 - 99 mg/dL 295  284  132   BUN 8 - 23 mg/dL 92  96  90   Creatinine 0.61 - 1.24 mg/dL 4.40  1.02  7.25  Sodium 135 - 145 mmol/L 130  130  131   Potassium 3.5 - 5.1 mmol/L 3.7  3.7  3.9   Chloride 98 - 111 mmol/L 87  86  90   CO2 22 - 32 mmol/L 28  29  30    Calcium 8.9 - 10.3 mg/dL 9.2  9.5  9.3       Microbiology: Bc - staph lugdunensis Studies/Results: [place] EKG SITE RITE  Result Date: 04/20/2023 If Site Rite image not attached, placement could not be confirmed due to current cardiac rhythm.  MR THORACIC SPINE WO CONTRAST  Result Date: 04/19/2023 CLINICAL DATA:  72 year old male with bacteremia and persistent back pain, mid back pain. EXAM: MRI THORACIC SPINE WITHOUT CONTRAST TECHNIQUE: Multiplanar, multisequence MR imaging of the thoracic spine was performed. No intravenous contrast was administered. COMPARISON:  Chest CT 04/14/2023. FINDINGS: Limited cervical spine imaging: Straightening of cervical lordosis but overall the cervical spine degeneration appears to be mild for age. Thoracic spine segmentation:  Normal on the recent CT. Alignment: Stable with some straightening of thoracic kyphosis. No significant scoliosis or spondylolisthesis. Vertebrae:  Abnormal T5 and T6 vertebral bodies with evidence of marrow edema and adjacent mild paraspinal soft tissue inflammation (series 21 images 5 through 16. Disc space loss there, but no vacuum disc on the recent CT. The disc space appears indistinct on T1 imaging. And there appear to be subtle endplate erosions adjacent to the disc. Bilateral facet joint fluid at that level, and also some increased STIR signal in the interspinous ligament there. Furthermore, abnormal epidural space at both of those levels more pronounced at T6 ventrally (series 23, image 19). Overall mild spinal and moderate bilateral T5, right T6 neural foraminal stenosis which is multifactorial. No convincing spinal cord mass effect. Background bone marrow signal within normal limits. Chronic degenerative endplate changes elsewhere but no other marrow edema or evidence of acute osseous abnormality. Cord: No spinal cord signal abnormality. Conus medullaris partially visible and appears to remain normal. Paraspinal and other soft tissues: Abnormal at T5-T6 as detailed above. But no discrete paraspinal fluid collection. Incidental trace layering pleural effusions. Incidental dermal cyst of the right upper back on series 21, image 4. Disc levels: In addition to the abnormal T5-T6 level described above there is widespread thoracic spine degeneration, including bulky chronic calcified disc or disc osteophyte complex at T1-T2 which effaces the ventral CSF there, although despite its size there is not high-grade spinal stenosis there. IMPRESSION: 1. Positive for strong evidence of T5-T6 Discitis Osteomyelitis on this noncontrast exam. Associated ventral epidural and also paraspinal phlegmon. No drainable fluid collection suspected. Mild subsequent spinal stenosis at both T5 and T6, and moderate multifactorial neural foraminal stenosis. No thoracic spinal cord compression or cord signal abnormality. 2. Elsewhere chronic thoracic spine degeneration. 3. Trace  layering pleural effusions. Electronically Signed   By: Odessa Fleming M.D.   On: 04/19/2023 11:28     Assessment/Plan: pt presenting with cramping chest pain, also had back pain, midthoracic, fever after hospitalization WBC went upto 31 Staph lugdunensis bacteremia - 1 of 4 bottle- low bioburden and also time to positivity was 60 hrs- true pathogen VS contaminant?? Staph lugdunensis is resistant to oxacillin Pt since admisison got ceftriaxone for 4 days, then cefepime X 1 day and cefazolin ( which he is currently on) Fever resolved ( could be from tylenol) but WBC also dropped from 31 to 14 So the cephalosporins did something T5-T6 discitis and osteomyelitis. Also has ventral epidural phlegmon and paraspinal  phlegmon. Discussed with IR , there is no fluid to drain, and also his body habitus, and thoracic spine access difficulty makes it not possible to do any diagnostic procedure-  So it could be that Staph lugdunensis is the culprit and we will treat that with Daptomycin Will also do ceftriaxone as he had responded to it and would cover gram neg Will need 6 weeks of IV until 05/30/23 Repeat blood culture neg in 48 rs Tunneled cath will be placed tomorrow-    B/l lymphedema legs With trauma to the left toes Had a calf wound last week . Healed Also had an abscess back    CKD   CHF   ? PAF on amiodarone?  Discussed the management with patient , sister  Discussed side effects of dapto/ceftriaxone  and the labs that would be monitored weekly  OPAT Diagnosis: Vertebral osteomyelitis Baseline Creatinine 3.4   Culture Result: blood 1 of 4 bottle staph lugdunensis  Allergies  Allergen Reactions   Atorvastatin Other (See Comments)    Other reaction(s): Not available Other reaction(s): Other (See Comments)    Red Dye #40 (Allura Red) Other (See Comments)    "not as allergic to it as I used to be. I can take a little of it"    OPAT Orders Discharge antibiotics: Daptomycin 800mg  IV  every 48 hrs  And Ceftriaxone 2 grams IV ever 24 hours Duration: 6 weeks End Date: 05/30/23  [facility] Care Per Protocol:( has tunneled cath)  Labs weekly while on IV antibiotics: _X_ CBC with differential _X_ CK _X_ CMP _X_ CRP _X_ ESR  Monitor for rhabomyolysis or eosinophilia  ___ Please leave central line  place until doctor ( vascular surgeon) has seen patient and will remove the cath  Fax weekly lab results  promptly to [phone]  Clinic Follow Up Appt:with Dr.Schwanda Zima  My chart vidoe- 05/04/23 at 9.15 am  Call [phone] with any critical value or questions

## 2023-04-20 NOTE — Treatment Plan (Signed)
  OPAT Diagnosis: Vertebral osteomyelitis Baseline Creatinine 3.4     Culture Result: blood 1 of 4 bottle staph lugdunensis   Allergies       Allergies  Allergen Reactions   Atorvastatin Other (See Comments)      Other reaction(s): Not available Other reaction(s): Other (See Comments)     Red Dye #40 (Allura Red) Other (See Comments)      "not as allergic to it as I used to be. I can take a little of it"        OPAT Orders Discharge antibiotics: Daptomycin 800mg  IV every 48 hrs   And Ceftriaxone 2 grams IV ever 24 hours Duration: 6 weeks End Date: 05/30/23   [facility] Care Per Protocol:( has tunneled cath)   Labs weekly while on IV antibiotics: _X_ CBC with differential _X_ CK _X_ CMP _X_ CRP _X_ ESR   Monitor for rhabomyolysis or eosinophilia   ___ Please leave central line  place until doctor ( vascular surgeon) has seen patient and will remove the cath   Fax weekly lab results  promptly to [phone]   Clinic Follow Up Appt:with Dr.Kaitlin Ardito  My chart vidoe- 05/04/23 at 9.15 am   Call [phone] with any critical value or questions

## 2023-04-20 NOTE — TOC Progression Note (Signed)
Transition of Care [facility]) - Progression Note    Patient Details  Name: Jason Hudson MRN: [id number] Date of Birth: Aug 13, 1951  [facility]) CM/SW Contact  Chapman Fitch, RN Phone Number: 04/20/2023, 11:44 AM  Clinical Narrative:     Per MD plan for DC tomorrow Patient now requires IV antibiotics at discharge Tammy at [facility] notified Message left for Tammy at [facility] to notify Plan remains for family to transport at discharge.  They confirm they have portable O2 in the room  Expected Discharge Plan: Home/Self Care Barriers to Discharge: Continued Medical Work up  Expected Discharge Plan and Services       Living arrangements for the past 2 months: Single Family Home                                       Social Determinants of Health (SDOH) Interventions SDOH Screenings   Food Insecurity: No Food Insecurity (04/13/2023)  Housing: Low Risk  (04/13/2023)  Transportation Needs: No Transportation Needs (04/13/2023)  Utilities: Not At Risk (04/13/2023)  Alcohol Screen: Low Risk  (05/11/2022)  Depression (PHQ2-9): Low Risk  (05/11/2022)  Tobacco Use: Low Risk  (04/13/2023)    Readmission Risk Interventions    04/14/2023   10:31 AM  Readmission Risk Prevention Plan  Transportation Screening Complete  PCP or Specialist Appt within 3-5 Days Complete  HRI or Home Care Consult Complete  Social Work Consult for Recovery Care Planning/Counseling Complete  Palliative Care Screening Not Complete  Medication Review Oceanographer) Complete

## 2023-04-20 NOTE — Consult Note (Signed)
Hospital Consult    Reason for Consult:  Tunnelled Hickman Access for long Term Antibiotics.  Requesting Physician:  Dr Lamont Dowdy MD.  MRN #:  [id number]  History of Present Illness: This is a 72 y.o. male  with medical history significant of chronic HFrEF with LVEF 40-45%, PAF not on anticoagulation due to history of severe GI bleed, CKD stage IV, COPD, IDDM with insulin resistance, gout, hypothyroidism, BPH, morbid obesity, presented with increasing shortness of breath and leg swelling and chest pain.   One out of four prior blood culture bottles started turning positive for staph lugdunensis, likely contaminant but patient did had leukocytosis and was febrile. Because of his persistent back pain MRI thoracic spine was obtained and it came back positive for T5-T4 discitis, osteomyelitis, some associated epidural and paraspinal phlegmon but no drainable fluid collection. Patient will need at least 6 weeks of IV antibiotics-ID to decide. Vascular Surgery consulted for placement of a tunneled Hickman catheter for long term antibiotics.    Past Medical History:  Diagnosis Date   CHF (congestive heart failure) (HCC)    COPD (chronic obstructive pulmonary disease) (HCC)    Diabetes mellitus without complication (HCC)    Edema    leg swelling   Gastroesophageal reflux disease 12/15/2017   Gout    Hypertension    Melena 11/05/2017   Pneumonia 07/27/2016    Past Surgical History:  Procedure Laterality Date   COLONOSCOPY WITH PROPOFOL N/A 11/07/2017   Procedure: COLONOSCOPY WITH PROPOFOL;  Surgeon: Wyline Mood, MD;  Location: [facility];  Service: Gastroenterology;  Laterality: N/A;   ESOPHAGOGASTRODUODENOSCOPY (EGD) WITH PROPOFOL N/A 11/07/2017   Procedure: ESOPHAGOGASTRODUODENOSCOPY (EGD) WITH PROPOFOL;  Surgeon: Wyline Mood, MD;  Location: [facility];  Service: Gastroenterology;  Laterality: N/A;   LEFT HEART CATH AND CORONARY ANGIOGRAPHY N/A 10/23/2017   Procedure: LEFT HEART  CATH AND CORONARY ANGIOGRAPHY;  Surgeon: Marcina Millard, MD;  Location: [facility];  Service: Cardiovascular;  Laterality: N/A;    Allergies  Allergen Reactions   Atorvastatin Other (See Comments)    Other reaction(s): Not available Other reaction(s): Other (See Comments)    Red Dye #40 (Allura Red) Other (See Comments)    "not as allergic to it as I used to be. I can take a little of it"    Prior to Admission medications   Medication Sig Start Date End Date Taking? Authorizing Provider  albuterol (VENTOLIN HFA) 108 (90 Base) MCG/ACT inhaler Inhale 1-2 puffs into the lungs every 6 (six) hours as needed for wheezing or shortness of breath. 01/03/23  Yes Chesley Noon, MD  allopurinol (ZYLOPRIM) 100 MG tablet TAKE ONE TABLET (100 MG) BY MOUTH ONCE DAILY 09/09/22  Yes Abernathy, Arlyss Repress, NP  amiodarone (PACERONE) 200 MG tablet Take 1 tablet (200 mg total) by mouth daily for 30 days. 09/15/18 04/12/23 Yes Pyreddy, Vivien Rota, MD  aspirin EC 81 MG tablet Take 81 mg by mouth daily. Swallow whole.   Yes [provider]  budesonide-formoterol (SYMBICORT) 160-4.5 MCG/ACT inhaler Inhale 2 puffs into the lungs 2 (two) times daily. 01/30/23 01/30/24 Yes [provider]  calcitRIOL (ROCALTROL) 0.25 MCG capsule Take 0.25 mcg by mouth daily. 03/01/23  Yes [provider]  carvedilol (COREG) 6.25 MG tablet Take 6.25 mg by mouth 2 (two) times daily with a meal. Pt gets from cardiologist   Yes [provider]  Cholecalciferol (VITAMIN D3) 50 MCG (2000 UT) TABS Take 2,000 Units by mouth at bedtime. 09/18/20  Yes Leeanne Deed  S, NP  colchicine 0.6 MG tablet Day 1: take 2 tablets by mouth now then take 1 more tablet by mouth 1 hour later.  After day 1, take 1 tablet daily until gout flare is resolved.  May repeat process as needed for future gout flare 01/08/22  Yes Abernathy, Arlyss Repress, NP  diphenhydrAMINE (BENADRYL) 25 mg capsule Take 25 mg by mouth every 6 (six) hours as  needed for itching or allergies.   Yes [provider]  doxazosin (CARDURA) 4 MG tablet TAKE 1 TABLET BY MOUTH AT BEDTIME 11/22/22  Yes Abernathy, Alyssa, NP  glipiZIDE (GLUCOTROL XL) 5 MG 24 hr tablet TAKE ONE TABLET EACH MORNING WITH BREAKFAST 11/22/22  Yes Abernathy, Arlyss Repress, NP  insulin degludec (TRESIBA) 200 UNIT/ML FlexTouch Pen Inject 120 Units into the skin daily before breakfast. 11/10/21  Yes Abernathy, Alyssa, NP  ipratropium (ATROVENT) 0.02 % nebulizer solution Take 2.5 mLs (0.5 mg total) by nebulization every 6 (six) hours as needed for wheezing or shortness of breath. 11/29/21  Yes Abernathy, Arlyss Repress, NP  isosorbide mononitrate (IMDUR) 30 MG 24 hr tablet Take 30 mg by mouth daily. 08/13/21  Yes [provider]  levothyroxine (SYNTHROID) 112 MCG tablet Take 112 mcg by mouth daily before breakfast. 09/25/20  Yes [provider]  loratadine (CLARITIN) 10 MG tablet Take 10 mg by mouth daily.    Yes [provider]  multivitamin (ONE-A-DAY MEN'S) TABS tablet Take 1 tablet by mouth daily.   Yes [provider]  nitroGLYCERIN (NITROSTAT) 0.4 MG SL tablet Place 1 tablet (0.4 mg total) under the tongue every 5 (five) minutes as needed for chest pain. 10/23/17  Yes Mody, Patricia Pesa, MD  pantoprazole (PROTONIX) 40 MG tablet Take 40 mg by mouth daily. 01/17/23  Yes [provider]  potassium chloride (KLOR-CON) 10 MEQ tablet TAKE TWO TABLETS EVERY OTHER DAY AND 1 TABLET ON THE DAYS IN BETWEEN Patient taking differently: 30 mEq daily. 05/06/22  Yes Abernathy, Alyssa, NP  torsemide (DEMADEX) 20 MG tablet Take 80 mg by mouth daily. 03/21/22  Yes [provider]  TRESIBA FLEXTOUCH 100 UNIT/ML FlexTouch Pen INJECT 70 UNITS SQ TWICE A DAY Patient taking differently: Inject 120 Units into the skin daily. 04/23/22  Yes Abernathy, Arlyss Repress, NP  albuterol (PROVENTIL) (2.5 MG/3ML) 0.083% nebulizer solution Take 3 mLs (2.5 mg total) by nebulization every 6 (six) hours as  needed for wheezing or shortness of breath. Patient not taking: Reported on 04/12/2023 04/26/22   Lyndon Code, MD  fluticasone [facility]) 50 MCG/ACT nasal spray Place 2 sprays into both nostrils 2 (two) times daily.  Patient not taking: Reported on 04/12/2023    [provider]  levothyroxine (SYNTHROID) 88 MCG tablet TAKE ONE TABLET EACH MORNING BEFORE BREAKFAST Patient not taking: Reported on 04/12/2023 03/18/22   Sallyanne Kuster, NP  metolazone (ZAROXOLYN) 5 MG tablet Take 5 mg by mouth daily. Patient not taking: Reported on 04/12/2023 05/05/22   [provider]  oxyCODONE (OXY IR/ROXICODONE) 5 MG immediate release tablet Take one tab po qd prn for pain Patient not taking: Reported on 04/13/2023 05/23/22   Lyndon Code, MD  predniSONE (DELTASONE) 20 MG tablet 3 tablets daily x 4 days Patient not taking: Reported on 04/12/2023 04/22/22   Irean Hong, MD  traZODone (DESYREL) 50 MG tablet TAKE ONE TABLET AT BEDTIME AS NEEDED FORSLEEP Patient not taking: Reported on 04/12/2023 08/30/21   Sallyanne Kuster, NP    Social History   Socioeconomic History  Marital status: Single    Spouse name: Not on file   Number of children: Not on file   Years of education: Not on file   Highest education level: Not on file  Occupational History   Not on file  Tobacco Use   Smoking status: Never   Smokeless tobacco: Never  Substance and Sexual Activity   Alcohol use: Not Currently    Comment: ocassionally   Drug use: Never   Sexual activity: Not on file  Other Topics Concern   Not on file  Social History Narrative   Not on file   Social Determinants of Health   Financial Resource Strain: Not on file  Food Insecurity: No Food Insecurity (04/13/2023)   Hunger Vital Sign    Worried About Running Out of Food in the Last Year: Never true    Ran Out of Food in the Last Year: Never true  Transportation Needs: No Transportation Needs (04/13/2023)   PRAPARE - Scientist, research (physical sciences) (Medical): No    Lack of Transportation (Non-Medical): No  Physical Activity: Not on file  Stress: Not on file  Social Connections: Not on file  Intimate Partner Violence: Not At Risk (04/13/2023)   Humiliation, Afraid, Rape, and Kick questionnaire    Fear of Current or Ex-Partner: No    Emotionally Abused: No    Physically Abused: No    Sexually Abused: No     Family History  Problem Relation Age of Onset   Cancer Mother    CVA Father    Heart disease Father     ROS: Otherwise negative unless mentioned in HPI  Physical Examination  Vitals:   04/20/23 0206 04/20/23 0734  BP: 128/70 138/69  Pulse: 73 75  Resp: 20 20  Temp: 98.3 F (36.8 C) 97.7 F (36.5 C)  SpO2: 92% 99%   Body mass index is 49.54 kg/m.  General:  WDWN in NAD Morbid Obesity Gait: Not observed HENT: WNL, normocephalic Pulmonary: normal non-labored breathing, without Rales, rhonchi,  wheezing Cardiac: regular, without  Murmurs, rubs or gallops; without carotid bruits Abdomen: Positive bowel sounds throughout, soft, NT/ND, no masses Skin: without rashes Vascular Exam/Pulses: +1 positive edema to bilateral lower extremities. Unable to palpate pulses.  Extremities: without ischemic changes, without Gangrene , without cellulitis; without open wounds;  Musculoskeletal: no muscle wasting or atrophy  Neurologic: A&O X 3;  No focal weakness or paresthesias are detected; speech is fluent/normal Psychiatric:  The pt has Normal affect. Lymph:  Unremarkable  CBC    Component Value Date/Time   WBC 17.4 (H) 04/19/2023 0445   RBC 3.74 (L) 04/19/2023 0445   HGB 11.6 (L) 04/19/2023 0445   HGB 11.6 (L) 10/21/2020 1102   HCT 35.1 (L) 04/19/2023 0445   HCT 35.9 (L) 10/21/2020 1102   PLT 256 04/19/2023 0445   PLT 311 10/21/2020 1102   MCV 93.9 04/19/2023 0445   MCV 91 10/21/2020 1102   MCH 31.0 04/19/2023 0445   MCHC 33.0 04/19/2023 0445   RDW 13.9 04/19/2023 0445   RDW 16.3 (H) 10/21/2020  1102   LYMPHSABS 2.3 04/19/2023 0445   LYMPHSABS 1.7 10/21/2020 1102   MONOABS 1.9 (H) 04/19/2023 0445   EOSABS 0.6 (H) 04/19/2023 0445   EOSABS 0.3 10/21/2020 1102   BASOSABS 0.1 04/19/2023 0445   BASOSABS 0.1 10/21/2020 1102    BMET    Component Value Date/Time   NA 130 (L) 04/20/2023 0440   NA 137 10/21/2020 1102  K 3.7 04/20/2023 0440   CL 87 (L) 04/20/2023 0440   CO2 28 04/20/2023 0440   GLUCOSE 151 (H) 04/20/2023 0440   BUN 92 (H) 04/20/2023 0440   BUN 35 (H) 10/21/2020 1102   CREATININE 3.38 (H) 04/20/2023 0440   CALCIUM 9.2 04/20/2023 0440   GFRNONAA 19 (L) 04/20/2023 0440   GFRAA 38 (L) 09/18/2020 1050    COAGS: Lab Results  Component Value Date   INR 1.1 04/15/2023   INR 1.04 07/02/2018   INR 0.97 10/20/2017     Non-Invasive Vascular Imaging:   EXAM:04/19/23 MRI THORACIC SPINE WITHOUT CONTRAST   TECHNIQUE: Multiplanar, multisequence MR imaging of the thoracic spine was performed. No intravenous contrast was administered.   COMPARISON:  Chest CT 04/14/2023.   FINDINGS: Limited cervical spine imaging: Straightening of cervical lordosis but overall the cervical spine degeneration appears to be mild for age.   Thoracic spine segmentation:  Normal on the recent CT.   Alignment: Stable with some straightening of thoracic kyphosis. No significant scoliosis or spondylolisthesis.   Vertebrae: Abnormal T5 and T6 vertebral bodies with evidence of marrow edema and adjacent mild paraspinal soft tissue inflammation (series 21 images 5 through 16. Disc space loss there, but no vacuum disc on the recent CT. The disc space appears indistinct on T1 imaging. And there appear to be subtle endplate erosions adjacent to the disc. Bilateral facet joint fluid at that level, and also some increased STIR signal in the interspinous ligament there. Furthermore, abnormal epidural space at both of those levels more pronounced at T6 ventrally (series 23, image 19). Overall  mild spinal and moderate bilateral T5, right T6 neural foraminal stenosis which is multifactorial. No convincing spinal cord mass effect.   Background bone marrow signal within normal limits. Chronic degenerative endplate changes elsewhere but no other marrow edema or evidence of acute osseous abnormality.   Cord: No spinal cord signal abnormality. Conus medullaris partially visible and appears to remain normal.   Paraspinal and other soft tissues: Abnormal at T5-T6 as detailed above. But no discrete paraspinal fluid collection.   Incidental trace layering pleural effusions. Incidental dermal cyst of the right upper back on series 21, image 4.   Disc levels:   In addition to the abnormal T5-T6 level described above there is widespread thoracic spine degeneration, including bulky chronic calcified disc or disc osteophyte complex at T1-T2 which effaces the ventral CSF there, although despite its size there is not high-grade spinal stenosis there.   IMPRESSION: 1. Positive for strong evidence of T5-T6 Discitis Osteomyelitis on this noncontrast exam. Associated ventral epidural and also paraspinal phlegmon. No drainable fluid collection suspected. Mild subsequent spinal stenosis at both T5 and T6, and moderate multifactorial neural foraminal stenosis. No thoracic spinal cord compression or cord signal abnormality. 2. Elsewhere chronic thoracic spine degeneration. 3. Trace layering pleural effusions.  Statin:  No. Beta Blocker:  Yes.   Aspirin:  Yes.   ACEI:  No. ARB:  No. CCB use:  No Other antiplatelets/anticoagulants:  No.    ASSESSMENT/PLAN: This is a 72 y.o. male who presented to [facility] emergency room with increasing shortness of breath and leg swelling and chest pain. Upon work up he was found to have T5-T6 discitis osteomyelitis requiring a tunneled catheter be placed for long term antibiotics.   I discussed in detail with the patient the procedure, benefits, risks and  complications. Patient verbalizes his understanding. He would like to proceed as soon as possible. I answered all the patients  questions this afternoon. He will be made NPO after midnight for procedure tomorrow.    -I discussed the plan in detail with Dr Vilinda Flake MD and he agrees with the plan.    Marcie Bal [facility] 04/20/2023 2:14 PM

## 2023-04-20 NOTE — Progress Notes (Signed)
Mobility Specialist - Progress Note   04/20/23 1409  Mobility  Activity Transferred from bed to chair;Ambulated with assistance in room  Level of Assistance Contact guard assist, steadying assist  Assistive Device Front wheel walker  Distance Ambulated (ft) 6 ft  Activity Response Tolerated well  $Mobility charge 1 Mobility  Mobility Specialist Start Time (ACUTE ONLY) 1341  Mobility Specialist Stop Time (ACUTE ONLY) 1407  Mobility Specialist Time Calculation (min) (ACUTE ONLY) 26 min   Pt sitting in the recliner upon entry, utilizing RA. Pt STS to RW MinA, transferred to the recliner CGA-MinG-- requiring min manual movement of the RW. Pt left supine with needs within reach. RN present upon MS exit.  Zetta Bills Mobility Specialist 04/20/23 2:12 PM

## 2023-04-20 NOTE — Care Management Important Message (Signed)
Important Message  Patient Details  Name: Keyshawn Snide MRN: [id number] Date of Birth: 06-18-1951   Medicare Important Message Given:  Yes     [name] 04/20/2023, 1:54 PM

## 2023-04-20 NOTE — H&P (View-Only) (Signed)
 Hospital Consult    Reason for Consult:  Tunnelled Hickman Access for long Term Antibiotics.  Requesting Physician:  Dr Lamont Dowdy MD.  MRN #:  [id number]  History of Present Illness: This is a 72 y.o. male  with medical history significant of chronic HFrEF with LVEF 40-45%, PAF not on anticoagulation due to history of severe GI bleed, CKD stage IV, COPD, IDDM with insulin resistance, gout, hypothyroidism, BPH, morbid obesity, presented with increasing shortness of breath and leg swelling and chest pain.   One out of four prior blood culture bottles started turning positive for staph lugdunensis, likely contaminant but patient did had leukocytosis and was febrile. Because of his persistent back pain MRI thoracic spine was obtained and it came back positive for T5-T4 discitis, osteomyelitis, some associated epidural and paraspinal phlegmon but no drainable fluid collection. Patient will need at least 6 weeks of IV antibiotics-ID to decide. Vascular Surgery consulted for placement of a tunneled Hickman catheter for long term antibiotics.    Past Medical History:  Diagnosis Date   CHF (congestive heart failure) (HCC)    COPD (chronic obstructive pulmonary disease) (HCC)    Diabetes mellitus without complication (HCC)    Edema    leg swelling   Gastroesophageal reflux disease 12/15/2017   Gout    Hypertension    Melena 11/05/2017   Pneumonia 07/27/2016    Past Surgical History:  Procedure Laterality Date   COLONOSCOPY WITH PROPOFOL N/A 11/07/2017   Procedure: COLONOSCOPY WITH PROPOFOL;  Surgeon: Wyline Mood, MD;  Location: [facility];  Service: Gastroenterology;  Laterality: N/A;   ESOPHAGOGASTRODUODENOSCOPY (EGD) WITH PROPOFOL N/A 11/07/2017   Procedure: ESOPHAGOGASTRODUODENOSCOPY (EGD) WITH PROPOFOL;  Surgeon: Wyline Mood, MD;  Location: [facility];  Service: Gastroenterology;  Laterality: N/A;   LEFT HEART CATH AND CORONARY ANGIOGRAPHY N/A 10/23/2017   Procedure: LEFT HEART  CATH AND CORONARY ANGIOGRAPHY;  Surgeon: Marcina Millard, MD;  Location: [facility];  Service: Cardiovascular;  Laterality: N/A;    Allergies  Allergen Reactions   Atorvastatin Other (See Comments)    Other reaction(s): Not available Other reaction(s): Other (See Comments)    Red Dye #40 (Allura Red) Other (See Comments)    "not as allergic to it as I used to be. I can take a little of it"    Prior to Admission medications   Medication Sig Start Date End Date Taking? Authorizing Provider  albuterol (VENTOLIN HFA) 108 (90 Base) MCG/ACT inhaler Inhale 1-2 puffs into the lungs every 6 (six) hours as needed for wheezing or shortness of breath. 01/03/23  Yes Chesley Noon, MD  allopurinol (ZYLOPRIM) 100 MG tablet TAKE ONE TABLET (100 MG) BY MOUTH ONCE DAILY 09/09/22  Yes Abernathy, Arlyss Repress, NP  amiodarone (PACERONE) 200 MG tablet Take 1 tablet (200 mg total) by mouth daily for 30 days. 09/15/18 04/12/23 Yes Pyreddy, Vivien Rota, MD  aspirin EC 81 MG tablet Take 81 mg by mouth daily. Swallow whole.   Yes [provider]  budesonide-formoterol (SYMBICORT) 160-4.5 MCG/ACT inhaler Inhale 2 puffs into the lungs 2 (two) times daily. 01/30/23 01/30/24 Yes [provider]  calcitRIOL (ROCALTROL) 0.25 MCG capsule Take 0.25 mcg by mouth daily. 03/01/23  Yes [provider]  carvedilol (COREG) 6.25 MG tablet Take 6.25 mg by mouth 2 (two) times daily with a meal. Pt gets from cardiologist   Yes [provider]  Cholecalciferol (VITAMIN D3) 50 MCG (2000 UT) TABS Take 2,000 Units by mouth at bedtime. 09/18/20  Yes Leeanne Deed  S, NP  colchicine 0.6 MG tablet Day 1: take 2 tablets by mouth now then take 1 more tablet by mouth 1 hour later.  After day 1, take 1 tablet daily until gout flare is resolved.  May repeat process as needed for future gout flare 01/08/22  Yes Abernathy, Arlyss Repress, NP  diphenhydrAMINE (BENADRYL) 25 mg capsule Take 25 mg by mouth every 6 (six) hours as  needed for itching or allergies.   Yes [provider]  doxazosin (CARDURA) 4 MG tablet TAKE 1 TABLET BY MOUTH AT BEDTIME 11/22/22  Yes Abernathy, Alyssa, NP  glipiZIDE (GLUCOTROL XL) 5 MG 24 hr tablet TAKE ONE TABLET EACH MORNING WITH BREAKFAST 11/22/22  Yes Abernathy, Arlyss Repress, NP  insulin degludec (TRESIBA) 200 UNIT/ML FlexTouch Pen Inject 120 Units into the skin daily before breakfast. 11/10/21  Yes Abernathy, Alyssa, NP  ipratropium (ATROVENT) 0.02 % nebulizer solution Take 2.5 mLs (0.5 mg total) by nebulization every 6 (six) hours as needed for wheezing or shortness of breath. 11/29/21  Yes Abernathy, Arlyss Repress, NP  isosorbide mononitrate (IMDUR) 30 MG 24 hr tablet Take 30 mg by mouth daily. 08/13/21  Yes [provider]  levothyroxine (SYNTHROID) 112 MCG tablet Take 112 mcg by mouth daily before breakfast. 09/25/20  Yes [provider]  loratadine (CLARITIN) 10 MG tablet Take 10 mg by mouth daily.    Yes [provider]  multivitamin (ONE-A-DAY MEN'S) TABS tablet Take 1 tablet by mouth daily.   Yes [provider]  nitroGLYCERIN (NITROSTAT) 0.4 MG SL tablet Place 1 tablet (0.4 mg total) under the tongue every 5 (five) minutes as needed for chest pain. 10/23/17  Yes Mody, Patricia Pesa, MD  pantoprazole (PROTONIX) 40 MG tablet Take 40 mg by mouth daily. 01/17/23  Yes [provider]  potassium chloride (KLOR-CON) 10 MEQ tablet TAKE TWO TABLETS EVERY OTHER DAY AND 1 TABLET ON THE DAYS IN BETWEEN Patient taking differently: 30 mEq daily. 05/06/22  Yes Abernathy, Alyssa, NP  torsemide (DEMADEX) 20 MG tablet Take 80 mg by mouth daily. 03/21/22  Yes [provider]  TRESIBA FLEXTOUCH 100 UNIT/ML FlexTouch Pen INJECT 70 UNITS SQ TWICE A DAY Patient taking differently: Inject 120 Units into the skin daily. 04/23/22  Yes Abernathy, Arlyss Repress, NP  albuterol (PROVENTIL) (2.5 MG/3ML) 0.083% nebulizer solution Take 3 mLs (2.5 mg total) by nebulization every 6 (six) hours as  needed for wheezing or shortness of breath. Patient not taking: Reported on 04/12/2023 04/26/22   Lyndon Code, MD  fluticasone [facility]) 50 MCG/ACT nasal spray Place 2 sprays into both nostrils 2 (two) times daily.  Patient not taking: Reported on 04/12/2023    [provider]  levothyroxine (SYNTHROID) 88 MCG tablet TAKE ONE TABLET EACH MORNING BEFORE BREAKFAST Patient not taking: Reported on 04/12/2023 03/18/22   Sallyanne Kuster, NP  metolazone (ZAROXOLYN) 5 MG tablet Take 5 mg by mouth daily. Patient not taking: Reported on 04/12/2023 05/05/22   [provider]  oxyCODONE (OXY IR/ROXICODONE) 5 MG immediate release tablet Take one tab po qd prn for pain Patient not taking: Reported on 04/13/2023 05/23/22   Lyndon Code, MD  predniSONE (DELTASONE) 20 MG tablet 3 tablets daily x 4 days Patient not taking: Reported on 04/12/2023 04/22/22   Irean Hong, MD  traZODone (DESYREL) 50 MG tablet TAKE ONE TABLET AT BEDTIME AS NEEDED FORSLEEP Patient not taking: Reported on 04/12/2023 08/30/21   Sallyanne Kuster, NP    Social History   Socioeconomic History  Marital status: Single    Spouse name: Not on file   Number of children: Not on file   Years of education: Not on file   Highest education level: Not on file  Occupational History   Not on file  Tobacco Use   Smoking status: Never   Smokeless tobacco: Never  Substance and Sexual Activity   Alcohol use: Not Currently    Comment: ocassionally   Drug use: Never   Sexual activity: Not on file  Other Topics Concern   Not on file  Social History Narrative   Not on file   Social Determinants of Health   Financial Resource Strain: Not on file  Food Insecurity: No Food Insecurity (04/13/2023)   Hunger Vital Sign    Worried About Running Out of Food in the Last Year: Never true    Ran Out of Food in the Last Year: Never true  Transportation Needs: No Transportation Needs (04/13/2023)   PRAPARE - Scientist, research (physical sciences) (Medical): No    Lack of Transportation (Non-Medical): No  Physical Activity: Not on file  Stress: Not on file  Social Connections: Not on file  Intimate Partner Violence: Not At Risk (04/13/2023)   Humiliation, Afraid, Rape, and Kick questionnaire    Fear of Current or Ex-Partner: No    Emotionally Abused: No    Physically Abused: No    Sexually Abused: No     Family History  Problem Relation Age of Onset   Cancer Mother    CVA Father    Heart disease Father     ROS: Otherwise negative unless mentioned in HPI  Physical Examination  Vitals:   04/20/23 0206 04/20/23 0734  BP: 128/70 138/69  Pulse: 73 75  Resp: 20 20  Temp: 98.3 F (36.8 C) 97.7 F (36.5 C)  SpO2: 92% 99%   Body mass index is 49.54 kg/m.  General:  WDWN in NAD Morbid Obesity Gait: Not observed HENT: WNL, normocephalic Pulmonary: normal non-labored breathing, without Rales, rhonchi,  wheezing Cardiac: regular, without  Murmurs, rubs or gallops; without carotid bruits Abdomen: Positive bowel sounds throughout, soft, NT/ND, no masses Skin: without rashes Vascular Exam/Pulses: +1 positive edema to bilateral lower extremities. Unable to palpate pulses.  Extremities: without ischemic changes, without Gangrene , without cellulitis; without open wounds;  Musculoskeletal: no muscle wasting or atrophy  Neurologic: A&O X 3;  No focal weakness or paresthesias are detected; speech is fluent/normal Psychiatric:  The pt has Normal affect. Lymph:  Unremarkable  CBC    Component Value Date/Time   WBC 17.4 (H) 04/19/2023 0445   RBC 3.74 (L) 04/19/2023 0445   HGB 11.6 (L) 04/19/2023 0445   HGB 11.6 (L) 10/21/2020 1102   HCT 35.1 (L) 04/19/2023 0445   HCT 35.9 (L) 10/21/2020 1102   PLT 256 04/19/2023 0445   PLT 311 10/21/2020 1102   MCV 93.9 04/19/2023 0445   MCV 91 10/21/2020 1102   MCH 31.0 04/19/2023 0445   MCHC 33.0 04/19/2023 0445   RDW 13.9 04/19/2023 0445   RDW 16.3 (H) 10/21/2020  1102   LYMPHSABS 2.3 04/19/2023 0445   LYMPHSABS 1.7 10/21/2020 1102   MONOABS 1.9 (H) 04/19/2023 0445   EOSABS 0.6 (H) 04/19/2023 0445   EOSABS 0.3 10/21/2020 1102   BASOSABS 0.1 04/19/2023 0445   BASOSABS 0.1 10/21/2020 1102    BMET    Component Value Date/Time   NA 130 (L) 04/20/2023 0440   NA 137 10/21/2020 1102  K 3.7 04/20/2023 0440   CL 87 (L) 04/20/2023 0440   CO2 28 04/20/2023 0440   GLUCOSE 151 (H) 04/20/2023 0440   BUN 92 (H) 04/20/2023 0440   BUN 35 (H) 10/21/2020 1102   CREATININE 3.38 (H) 04/20/2023 0440   CALCIUM 9.2 04/20/2023 0440   GFRNONAA 19 (L) 04/20/2023 0440   GFRAA 38 (L) 09/18/2020 1050    COAGS: Lab Results  Component Value Date   INR 1.1 04/15/2023   INR 1.04 07/02/2018   INR 0.97 10/20/2017     Non-Invasive Vascular Imaging:   EXAM:04/19/23 MRI THORACIC SPINE WITHOUT CONTRAST   TECHNIQUE: Multiplanar, multisequence MR imaging of the thoracic spine was performed. No intravenous contrast was administered.   COMPARISON:  Chest CT 04/14/2023.   FINDINGS: Limited cervical spine imaging: Straightening of cervical lordosis but overall the cervical spine degeneration appears to be mild for age.   Thoracic spine segmentation:  Normal on the recent CT.   Alignment: Stable with some straightening of thoracic kyphosis. No significant scoliosis or spondylolisthesis.   Vertebrae: Abnormal T5 and T6 vertebral bodies with evidence of marrow edema and adjacent mild paraspinal soft tissue inflammation (series 21 images 5 through 16. Disc space loss there, but no vacuum disc on the recent CT. The disc space appears indistinct on T1 imaging. And there appear to be subtle endplate erosions adjacent to the disc. Bilateral facet joint fluid at that level, and also some increased STIR signal in the interspinous ligament there. Furthermore, abnormal epidural space at both of those levels more pronounced at T6 ventrally (series 23, image 19). Overall  mild spinal and moderate bilateral T5, right T6 neural foraminal stenosis which is multifactorial. No convincing spinal cord mass effect.   Background bone marrow signal within normal limits. Chronic degenerative endplate changes elsewhere but no other marrow edema or evidence of acute osseous abnormality.   Cord: No spinal cord signal abnormality. Conus medullaris partially visible and appears to remain normal.   Paraspinal and other soft tissues: Abnormal at T5-T6 as detailed above. But no discrete paraspinal fluid collection.   Incidental trace layering pleural effusions. Incidental dermal cyst of the right upper back on series 21, image 4.   Disc levels:   In addition to the abnormal T5-T6 level described above there is widespread thoracic spine degeneration, including bulky chronic calcified disc or disc osteophyte complex at T1-T2 which effaces the ventral CSF there, although despite its size there is not high-grade spinal stenosis there.   IMPRESSION: 1. Positive for strong evidence of T5-T6 Discitis Osteomyelitis on this noncontrast exam. Associated ventral epidural and also paraspinal phlegmon. No drainable fluid collection suspected. Mild subsequent spinal stenosis at both T5 and T6, and moderate multifactorial neural foraminal stenosis. No thoracic spinal cord compression or cord signal abnormality. 2. Elsewhere chronic thoracic spine degeneration. 3. Trace layering pleural effusions.  Statin:  No. Beta Blocker:  Yes.   Aspirin:  Yes.   ACEI:  No. ARB:  No. CCB use:  No Other antiplatelets/anticoagulants:  No.    ASSESSMENT/PLAN: This is a 72 y.o. male who presented to [facility] emergency room with increasing shortness of breath and leg swelling and chest pain. Upon work up he was found to have T5-T6 discitis osteomyelitis requiring a tunneled catheter be placed for long term antibiotics.   I discussed in detail with the patient the procedure, benefits, risks and  complications. Patient verbalizes his understanding. He would like to proceed as soon as possible. I answered all the patients  questions this afternoon. He will be made NPO after midnight for procedure tomorrow.    -I discussed the plan in detail with Dr Vilinda Flake MD and he agrees with the plan.    Marcie [facility] 04/20/2023 2:14 PM

## 2023-04-20 NOTE — Progress Notes (Signed)
[facility]  ROUNDING NOTE   Subjective:   Jason Hudson is a 72 y.o. male with past medical history of chronic systolic heart failure, atrial fibrillation on anticoagulation and chronic kidney disease stage IV.  Patient presents to the emergency department complaining of shortness of breath with chest pain and lower extremity edema.  Patient has been admitted for Shortness of breath [R06.02] CHF (congestive heart failure) (HCC) [I50.9] Edema, unspecified type [R60.9] Chest pain, unspecified type [R07.9]  Patient is known to our practice and is followed outpatient by Dr. Wynelle Link.  Patient was last seen in office on April 04, 2023 for routine follow-up.   Patient seen sitting up in chair, sister and brother in law at bedside Alert and oriented 5L [place] in place    Creatinine 3.38 (3.31) (3.09) (3.2) Urine output in past 24 hours  Objective:  Vital signs in last 24 hours:  Temp:  [97.7 F (36.5 C)-98.3 F (36.8 C)] 97.7 F (36.5 C) (08/29 0734) Pulse Rate:  [72-75] 75 (08/29 0734) Resp:  [18-20] 20 (08/29 0734) BP: (104-138)/(53-70) 138/69 (08/29 0734) SpO2:  [92 %-99 %] 99 % (08/29 0734)  Weight change:  Filed Weights   04/13/23 0153 04/15/23 0500  Weight: (!) 145.2 kg (!) 147.8 kg    Intake/Output: I/O last 3 completed shifts: In: 1420 [P.O.:1320; IV Piggyback:100] Out: 4850 [Urine:4850]   Intake/Output this shift:  Total I/O In: 240 [P.O.:240] Out: 900 [Urine:900]  Physical Exam: General: NAD, sitting in chair  Head: Normocephalic, atraumatic. Moist oral mucosal membranes  Eyes: Anicteric  Neck: Supple  Lungs:  Clear to auscultation, [place]  Heart: Regular rate and rhythm  Abdomen:  Soft, nontender, obese  Extremities: 1+  peripheral edema.  Neurologic: Nonfocal, moving all four extremities  Skin: No lesions, bilateral venous stasis  Access: None    Basic Metabolic Panel: Recent Labs  Lab 04/14/23 0515 04/15/23 0433  04/16/23 0543 04/17/23 0432 04/18/23 0939 04/19/23 0445 04/20/23 0440  NA 136   < > 130* 134* 131* 130* 130*  K 4.2   < > 4.0 4.1 3.9 3.7 3.7  CL 89*   < > 90* 88* 90* 86* 87*  CO2 30   < > 28 30 30 29 28   GLUCOSE 129*   < > 86 102* 183* 137* 151*  BUN 57*   < > 63* 71* 90* 96* 92*  CREATININE 3.27*   < > 2.88* 3.20* 3.09* 3.31* 3.38*  CALCIUM 11.3*   < > 9.6 10.1 9.3 9.5 9.2  MG 1.6*  --   --   --   --   --   --   PHOS  --   --   --  4.4  --   --  4.7*   < > = values in this interval not displayed.    Liver Function Tests: Recent Labs  Lab 04/17/23 0432 04/20/23 0440  ALBUMIN 3.4* 3.2*   No results for input(s): "LIPASE", "AMYLASE" in the last 168 hours.  No results for input(s): "AMMONIA" in the last 168 hours.  CBC: Recent Labs  Lab 04/14/23 0515 04/15/23 0433 04/16/23 0543 04/19/23 0445  WBC 31.1* 17.6* 14.3* 17.4*  NEUTROABS  --   --   --  11.6*  HGB 11.7* 11.3* 11.9* 11.6*  HCT 36.0* 33.6* 37.2* 35.1*  MCV 95.7 95.2 97.9 93.9  PLT 187 169 201 256    Cardiac Enzymes: No results for input(s): "CKTOTAL", "CKMB", "CKMBINDEX", "TROPONINI" in the last  168 hours.   BNP: Invalid input(s): "POCBNP"  CBG: Recent Labs  Lab 04/19/23 1156 04/19/23 1640 04/19/23 2131 04/20/23 0735 04/20/23 1101  GLUCAP 133* 96 123* 140* 174*    Microbiology: Results for orders placed or performed during the hospital encounter of 04/12/23  SARS Coronavirus 2 by RT PCR (hospital order, performed in [facility] hospital lab) *cepheid single result test* Anterior Nasal Swab     Status: None   Collection Time: 04/13/23 10:53 AM   Specimen: Anterior Nasal Swab  Result Value Ref Range Status   SARS Coronavirus 2 by RT PCR NEGATIVE NEGATIVE Final    Comment: (NOTE) SARS-CoV-2 target nucleic acids are NOT DETECTED.  The SARS-CoV-2 RNA is generally detectable in upper and lower respiratory specimens during the acute phase of infection. The lowest concentration of SARS-CoV-2  viral copies this assay can detect is 250 copies / mL. A negative result does not preclude SARS-CoV-2 infection and should not be used as the sole basis for treatment or other patient management decisions.  A negative result may occur with improper specimen collection / handling, submission of specimen other than nasopharyngeal swab, presence of viral mutation(s) within the areas targeted by this assay, and inadequate number of viral copies (<250 copies / mL). A negative result must be combined with clinical observations, patient history, and epidemiological information.  Fact Sheet for Patients:   RoadLapTop.co.za  Fact Sheet for Healthcare Providers: http://kim-miller.com/  This test is not yet approved or  cleared by the [place] FDA and has been authorized for detection and/or diagnosis of SARS-CoV-2 by FDA under an Emergency Use Authorization (EUA).  This EUA will remain in effect (meaning this test can be used) for the duration of the COVID-19 declaration under Section 564(b)(1) of the Act, 21 U.S.C. section 360bbb-3(b)(1), unless the authorization is terminated or revoked sooner.  Performed at [facility], [address] [place], [place] [address]   Respiratory (~20 pathogens) panel by PCR     Status: None   Collection Time: 04/14/23  2:30 AM   Specimen: Nasopharyngeal Swab; Respiratory  Result Value Ref Range Status   Adenovirus NOT DETECTED NOT DETECTED Final   Coronavirus 229E NOT DETECTED NOT DETECTED Final    Comment: (NOTE) The Coronavirus on the Respiratory Panel, DOES NOT test for the novel  Coronavirus (2019 nCoV)    Coronavirus HKU1 NOT DETECTED NOT DETECTED Final   Coronavirus NL63 NOT DETECTED NOT DETECTED Final   Coronavirus OC43 NOT DETECTED NOT DETECTED Final   Metapneumovirus NOT DETECTED NOT DETECTED Final   Rhinovirus / Enterovirus NOT DETECTED NOT DETECTED Final   Influenza A NOT DETECTED  NOT DETECTED Final   Influenza B NOT DETECTED NOT DETECTED Final   Parainfluenza Virus 1 NOT DETECTED NOT DETECTED Final   Parainfluenza Virus 2 NOT DETECTED NOT DETECTED Final   Parainfluenza Virus 3 NOT DETECTED NOT DETECTED Final   Parainfluenza Virus 4 NOT DETECTED NOT DETECTED Final   Respiratory Syncytial Virus NOT DETECTED NOT DETECTED Final   Bordetella pertussis NOT DETECTED NOT DETECTED Final   Bordetella Parapertussis NOT DETECTED NOT DETECTED Final   Chlamydophila pneumoniae NOT DETECTED NOT DETECTED Final   Mycoplasma pneumoniae NOT DETECTED NOT DETECTED Final    Comment: Performed at [facility], [address] [place], [place] [address]  MRSA Next Gen by PCR, Nasal     Status: None   Collection Time: 04/14/23  2:30 AM   Specimen: Nasal Mucosa; Nasal Swab  Result  Value Ref Range Status   MRSA by PCR Next Gen NOT DETECTED NOT DETECTED Final    Comment: (NOTE) The GeneXpert MRSA Assay (FDA approved for NASAL specimens only), is one component of a comprehensive MRSA colonization surveillance program. It is not intended to diagnose MRSA infection nor to guide or monitor treatment for MRSA infections. Test performance is not FDA approved in patients less than 108 years old. Performed at [facility], [address] [place], [place] [address]   Culture, blood (Routine X 2) w Reflex to ID Panel     Status: None   Collection Time: 04/14/23  9:25 AM   Specimen: BLOOD  Result Value Ref Range Status   Specimen Description BLOOD BLOOD RIGHT HAND  Final   Special Requests   Final    BOTTLES DRAWN AEROBIC AND ANAEROBIC Blood Culture adequate volume   Culture   Final    NO GROWTH 5 DAYS Performed at [facility], [address], [place], [place] [address]    Report Status 04/19/2023 FINAL  Final  Culture, blood (Routine X 2) w Reflex to ID Panel     Status: Abnormal   Collection Time: 04/14/23  9:25 AM   Specimen: BLOOD LEFT HAND  Result Value Ref  Range Status   Specimen Description   Final    BLOOD LEFT HAND Performed at [facility], [address] [place], [place] [address]    Special Requests   Final    BOTTLES DRAWN AEROBIC AND ANAEROBIC Blood Culture adequate volume Performed at [facility], [address] [place], [place] [address]    Culture  Setup Time   Final    GRAM POSITIVE COCCI ANAEROBIC BOTTLE ONLY CRITICAL RESULT CALLED TO, READ BACK BY AND VERIFIED WITH: JASON [email or web address]  04/17/13 RH Performed at [facility], [address], [place], [place] [address]    Culture STAPHYLOCOCCUS LUGDUNENSIS (A)  Final   Report Status 04/20/2023 FINAL  Final   Organism ID, Bacteria STAPHYLOCOCCUS LUGDUNENSIS  Final      Susceptibility   Staphylococcus lugdunensis - MIC*    CIPROFLOXACIN Value in next row Sensitive      SENSITIVE<=0.5    ERYTHROMYCIN Value in next row Sensitive      SENSITIVE<=0.25    GENTAMICIN Value in next row Sensitive      SENSITIVE<=0.5    OXACILLIN Value in next row Resistant      RESISTANT>=4    TETRACYCLINE Value in next row Sensitive      SENSITIVE<=1    VANCOMYCIN Value in next row Sensitive      SENSITIVE<=0.5    TRIMETH/SULFA Value in next row Sensitive      SENSITIVE<=10    CLINDAMYCIN Value in next row Sensitive      SENSITIVE<=0.25    RIFAMPIN Value in next row Sensitive      SENSITIVE<=0.5    Inducible Clindamycin Value in next row Sensitive      SENSITIVE<=0.5    LINEZOLID Value in next row Sensitive      SENSITIVE2    * STAPHYLOCOCCUS LUGDUNENSIS  Blood Culture ID Panel (Reflexed)     Status: Abnormal   Collection Time: 04/14/23  9:25 AM  Result Value Ref Range Status   Enterococcus faecalis NOT DETECTED NOT DETECTED Final   Enterococcus Faecium NOT DETECTED NOT DETECTED Final   Listeria monocytogenes NOT DETECTED NOT DETECTED Final   Staphylococcus species DETECTED (A) NOT DETECTED Final    Comment:  CRITICAL RESULT CALLED TO, READ BACK BY AND VERIFIED  WITH: JASON [email or web address]  04/18/23 RH    Staphylococcus aureus (BCID) NOT DETECTED NOT DETECTED Final   Staphylococcus epidermidis NOT DETECTED NOT DETECTED Final   Staphylococcus lugdunensis DETECTED (A) NOT DETECTED Final    Comment: CRITICAL RESULT CALLED TO, READ BACK BY AND VERIFIED WITH: JASON ROBBINS@[email or web address]  04/18/23 RH    Streptococcus species NOT DETECTED NOT DETECTED Final   Streptococcus agalactiae NOT DETECTED NOT DETECTED Final   Streptococcus pneumoniae NOT DETECTED NOT DETECTED Final   Streptococcus pyogenes NOT DETECTED NOT DETECTED Final   A.calcoaceticus-baumannii NOT DETECTED NOT DETECTED Final   Bacteroides fragilis NOT DETECTED NOT DETECTED Final   Enterobacterales NOT DETECTED NOT DETECTED Final   Enterobacter cloacae complex NOT DETECTED NOT DETECTED Final   Escherichia coli NOT DETECTED NOT DETECTED Final   Klebsiella aerogenes NOT DETECTED NOT DETECTED Final   Klebsiella oxytoca NOT DETECTED NOT DETECTED Final   Klebsiella pneumoniae NOT DETECTED NOT DETECTED Final   Proteus species NOT DETECTED NOT DETECTED Final   Salmonella species NOT DETECTED NOT DETECTED Final   Serratia marcescens NOT DETECTED NOT DETECTED Final   Haemophilus influenzae NOT DETECTED NOT DETECTED Final   Neisseria meningitidis NOT DETECTED NOT DETECTED Final   Pseudomonas aeruginosa NOT DETECTED NOT DETECTED Final   Stenotrophomonas maltophilia NOT DETECTED NOT DETECTED Final   Candida albicans NOT DETECTED NOT DETECTED Final   Candida auris NOT DETECTED NOT DETECTED Final   Candida glabrata NOT DETECTED NOT DETECTED Final   Candida krusei NOT DETECTED NOT DETECTED Final   Candida parapsilosis NOT DETECTED NOT DETECTED Final   Candida tropicalis NOT DETECTED NOT DETECTED Final   Cryptococcus neoformans/gattii NOT DETECTED NOT DETECTED Final   Methicillin resistance mecA/C NOT DETECTED NOT DETECTED Final    Comment: Performed at [facility], [address] [place], [place]  [address]  Culture, blood (Routine X 2) w Reflex to ID Panel     Status: None (Preliminary result)   Collection Time: 04/18/23 10:47 AM   Specimen: BLOOD  Result Value Ref Range Status   Specimen Description BLOOD BLOOD RIGHT HAND  Final   Special Requests   Final    BOTTLES DRAWN AEROBIC AND ANAEROBIC Blood Culture results may not be optimal due to an excessive volume of blood received in culture bottles   Culture   Final    NO GROWTH 2 DAYS Performed at [facility], [address] [place], [place] [address]    Report Status PENDING  Incomplete  Culture, blood (Routine X 2) w Reflex to ID Panel     Status: None (Preliminary result)   Collection Time: 04/18/23 10:55 AM   Specimen: BLOOD  Result Value Ref Range Status   Specimen Description BLOOD BLOOD LEFT HAND  Final   Special Requests   Final    BOTTLES DRAWN AEROBIC AND ANAEROBIC Blood Culture results may not be optimal due to an excessive volume of blood received in culture bottles   Culture   Final    NO GROWTH 2 DAYS Performed at [facility], [name] [place], [place] [address]    Report Status PENDING  Incomplete    Coagulation Studies: No results for input(s): "LABPROT", "INR" in the last 72 hours.   Urinalysis: No results for input(s): "COLORURINE", "LABSPEC", "PHURINE", "GLUCOSEU", "HGBUR", "BILIRUBINUR", "KETONESUR", "PROTEINUR", "UROBILINOGEN", "NITRITE", "LEUKOCYTESUR" in the last 72 hours.  Invalid input(s): "APPERANCEUR"    Imaging: [place] EKG SITE RITE  Result  Date: 04/20/2023 If [facility] image not attached, placement could not be confirmed due to current cardiac rhythm.  MR THORACIC SPINE WO CONTRAST  Result Date: 04/19/2023 CLINICAL DATA:  72 year old male with bacteremia and persistent back pain, mid back pain. EXAM: MRI THORACIC SPINE WITHOUT CONTRAST TECHNIQUE: Multiplanar, multisequence MR imaging of the thoracic spine was performed. No intravenous contrast was administered.  COMPARISON:  Chest CT 04/14/2023. FINDINGS: Limited cervical spine imaging: Straightening of cervical lordosis but overall the cervical spine degeneration appears to be mild for age. Thoracic spine segmentation:  Normal on the recent CT. Alignment: Stable with some straightening of thoracic kyphosis. No significant scoliosis or spondylolisthesis. Vertebrae: Abnormal T5 and T6 vertebral bodies with evidence of marrow edema and adjacent mild paraspinal soft tissue inflammation (series 21 images 5 through 16. Disc space loss there, but no vacuum disc on the recent CT. The disc space appears indistinct on T1 imaging. And there appear to be subtle endplate erosions adjacent to the disc. Bilateral facet joint fluid at that level, and also some increased STIR signal in the interspinous ligament there. Furthermore, abnormal epidural space at both of those levels more pronounced at T6 ventrally (series 23, image 19). Overall mild spinal and moderate bilateral T5, right T6 neural foraminal stenosis which is multifactorial. No convincing spinal cord mass effect. Background bone marrow signal within normal limits. Chronic degenerative endplate changes elsewhere but no other marrow edema or evidence of acute osseous abnormality. Cord: No spinal cord signal abnormality. Conus medullaris partially visible and appears to remain normal. Paraspinal and other soft tissues: Abnormal at T5-T6 as detailed above. But no discrete paraspinal fluid collection. Incidental trace layering pleural effusions. Incidental dermal cyst of the right upper back on series 21, image 4. Disc levels: In addition to the abnormal T5-T6 level described above there is widespread thoracic spine degeneration, including bulky chronic calcified disc or disc osteophyte complex at T1-T2 which effaces the ventral CSF there, although despite its size there is not high-grade spinal stenosis there. IMPRESSION: 1. Positive for strong evidence of T5-T6 Discitis  Osteomyelitis on this noncontrast exam. Associated ventral epidural and also paraspinal phlegmon. No drainable fluid collection suspected. Mild subsequent spinal stenosis at both T5 and T6, and moderate multifactorial neural foraminal stenosis. No thoracic spinal cord compression or cord signal abnormality. 2. Elsewhere chronic thoracic spine degeneration. 3. Trace layering pleural effusions. Electronically Signed   By: Odessa Fleming M.D.   On: 04/19/2023 11:28     Medications:    sodium chloride      ceFAZolin (ANCEF) IV 2 g (04/20/23 0953)    allopurinol  100 mg Oral Daily   amiodarone  200 mg Oral Daily   aspirin EC  81 mg Oral Daily   carvedilol  6.25 mg Oral BID WC   doxazosin  4 mg Oral QHS   enoxaparin (LOVENOX) injection  30 mg Subcutaneous Q24H   fluticasone  2 spray Each Nare BID   fluticasone furoate-vilanterol  1 puff Inhalation Daily   insulin aspart  0-20 Units Subcutaneous TID WC   insulin aspart  0-5 Units Subcutaneous QHS   insulin aspart  5 Units Subcutaneous TID WC   insulin glargine-yfgn  30 Units Subcutaneous BID   isosorbide mononitrate  30 mg Oral Daily   levothyroxine  112 mcg Oral Q0600   lidocaine  1 patch Transdermal Q24H   linagliptin  5 mg Oral Daily   living well with diabetes book   Does not apply Once  loratadine  10 mg Oral Daily   sodium chloride flush  3 mL Intravenous Q12H   spironolactone  25 mg Oral Daily   [START ON 04/21/2023] torsemide  40 mg Oral Daily   sodium chloride, acetaminophen, albuterol, cyclobenzaprine, docusate sodium, ipratropium, LORazepam, magnesium hydroxide, ondansetron (ZOFRAN) IV, oxyCODONE, sodium chloride flush, traZODone  Assessment/ Plan:  Jason Hudson is a 72 y.o.  male with past medical history of chronic systolic heart failure, atrial fibrillation on anticoagulation and chronic kidney disease stage IV.  Patient presents to the emergency department complaining of shortness of breath with chest pain and lower  extremity edema.  Patient has been admitted for Shortness of breath [R06.02] CHF (congestive heart failure) (HCC) [I50.9] Edema, unspecified type [R60.9] Chest pain, unspecified type [R07.9]   Acute Kidney Injury on chronic kidney disease stage IV with baseline creatinine 2.5 and GFR of 27 on 03/20/23.  Acute kidney injury secondary to fluid overload and aggressive diuresis Chronic kidney disease is secondary to poor diabetic control Awaiting renal ultrasound.  No IV contrast exposure.    Creatinine remains elevated. Nonoliguric.   Lab Results  Component Value Date   CREATININE 3.38 (H) 04/20/2023   CREATININE 3.31 (H) 04/19/2023   CREATININE 3.09 (H) 04/18/2023    Intake/Output Summary (Last 24 hours) at 04/20/2023 1229 Last data filed at 04/20/2023 0905 Gross per 24 hour  Intake 720 ml  Output 3750 ml  Net -3030 ml    2.  Acute on chronic diastolic heart failure.  Echo completed this admission shows EF 55 to 60%. Will decrease Torsemide 40mg  to daily.   3. Anemia of chronic kidney disease Lab Results  Component Value Date   HGB 11.6 (L) 04/19/2023    Hemoglobin at goal  4. 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 Home regimen includes [place]. Most recent hemoglobin A1c is 11.3 on 04/13/23.   5. Hyponatremia likely secondary to volume status. Placed on fluid restriction and continue diuresis.   6. Bacteremia, staphylococcus lugdunensis. MRI shows osteomyelitis at T5-T6. Receiving cefazolin for positive blood cultures. Would prefer tunneled access for prescribed 6 weeks of antibiotics.    LOS: 8 Amila Callies 8/29/202412:29 PM

## 2023-04-20 NOTE — Progress Notes (Signed)
Physical Therapy Treatment Patient Details Name: Jason Hudson MRN: [id number] DOB: 1951/02/08 Today's Date: 04/20/2023   History of Present Illness Jason Hudson is a 72 y.o. male with a history of hypertension, diabetes, CKD, CAD, dismal atrial fibrillation, CHF, amyloid cyst, and COPD who presents to hospital with sepsis due to pneumonia.    PT Comments  Pt was sitting in recliner upon arrival. He is A and O x 4. Agrees to session and remains motivated. Overall pt is severely limited by fatigue. Attempted wean from O2 however pt desaturates to 85% while in sitting. Reapplied 2 L o2 with pt only desaturating to 87% while ambulating. Pt requires prolonged recovery after ambulation. He seems to be far from his baseline and will continue to benefit from skilled PT to maximize independence and safety with all ADLs.    If plan is discharge home, recommend the following: A little help with walking and/or transfers;A lot of help with bathing/dressing/bathroom;Assist for transportation;Assistance with cooking/housework     Equipment Recommendations  Rolling walker (2 wheels) (Bariatric)       Precautions / Restrictions Precautions Precautions: Fall Restrictions Weight Bearing Restrictions: No     Mobility  Bed Mobility  General bed mobility comments: pt was in recliner pre/post session    Transfers Overall transfer level: Needs assistance Equipment used: Rolling walker (2 wheels), None Transfers: Sit to/from Stand Sit to Stand: Min assist  Ambulation/Gait Ambulation/Gait assistance: Contact guard assist Gait Distance (Feet): 120 Feet Assistive device: Rolling walker (2 wheels) Gait velocity: decreased     General Gait Details: Pt fatigues quickly and requires constant O2 to maintain sao2 > 88%. pt is SOB and needed prolonged recovery in sitting after gait training. poor overall activity tolerance     Balance Overall balance assessment: Needs  assistance Sitting-balance support: No upper extremity supported, Feet supported Sitting balance-Leahy Scale: Good     Standing balance support: Bilateral upper extremity supported, During functional activity, Reliant on assistive device for balance Standing balance-Leahy Scale: Fair Standing balance comment: steady ambulating with walker use     Cognition Arousal: Alert Behavior During Therapy: WFL for tasks assessed/performed Overall Cognitive Status: Impaired/Different from baseline Area of Impairment: Problem solving, Following commands  Following Commands: Follows one step commands with increased time Safety/Judgement: Decreased awareness of safety, Decreased awareness of deficits   Problem Solving: Slow processing                 Pertinent Vitals/Pain Pain Assessment Pain Assessment: No/denies pain     PT Goals (current goals can now be found in the care plan section) Acute Rehab PT Goals Patient Stated Goal: get better at rehab then go home Progress towards PT goals: Progressing toward goals    Frequency    Min 1X/week       AM-PAC PT "6 Clicks" Mobility   Outcome Measure  Help needed turning from your back to your side while in a flat bed without using bedrails?: A Little Help needed moving from lying on your back to sitting on the side of a flat bed without using bedrails?: A Lot Help needed moving to and from a bed to a chair (including a wheelchair)?: A Little Help needed standing up from a chair using your arms (e.g., wheelchair or bedside chair)?: A Little Help needed to walk in hospital room?: A Little Help needed climbing 3-5 steps with a railing? : A Lot 6 Click Score: 16    End of Session Equipment Utilized During Treatment:  Oxygen (2L o2 throughout sesison. destaurates in sitting to 86% on rm air.) Activity Tolerance: Patient limited by fatigue Patient left: in chair;with call bell/phone within reach Nurse Communication: Mobility  status;Precautions PT Visit Diagnosis: Difficulty in walking, not elsewhere classified (R26.2)     Time: 3664-4034 PT Time Calculation (min) (ACUTE ONLY): 32 min  Charges:    $Gait Training: 8-22 mins $Therapeutic Activity: 8-22 mins PT General Charges $$ ACUTE PT VISIT: 1 Visit                     Jetta Lout PTA 04/20/23, 10:13 AM

## 2023-04-20 NOTE — Progress Notes (Signed)
PHARMACY CONSULT NOTE FOR:  OUTPATIENT  PARENTERAL ANTIBIOTIC THERAPY (OPAT)  Indication: Vertebral Osteomyelitis Regimen: Daptomycin 800 mg IV every 48 hours and ceftriaxone 2 grams IV every 24 hours End date: 05/30/23  Weekly Labs: CBC with differential, CK, CMP, CRP, ESR  Fax weekly lab results promptly to [phone]   IV antibiotic discharge orders are pended. To discharging provider:  please sign these orders via discharge navigator,  Select New Orders & click on the button choice - Manage This Unsigned Work.     Thank you for allowing pharmacy to be a part of this patient's care.  Barrie Folk 04/20/2023, 7:52 PM

## 2023-04-20 NOTE — Progress Notes (Signed)
Progress Note   [name] GNF:[id number] DOB: Apr 18, 1951 DOA: 04/12/2023     8 DOS: the patient was seen and examined on 04/20/2023   Brief hospital course: Taken from H&P.  Jason Hudson is a 72 y.o. male with medical history significant of chronic HFrEF with LVEF 40-45%, PAF not on anticoagulation due to history of severe GI bleed, CKD stage IV, COPD, IDDM with insulin resistance, gout, hypothyroidism, BPH, morbid obesity, presented with increasing shortness of breath and leg swelling and chest pain.   On presentation patient was hemodynamically stable.  Chest x-ray with eventration of right hemidiaphragm with some adjacent atelectasis.  No pulmonary edema.  Labs pertinent for AKI with creatinine at 3.1, baseline seems to be around 2.1-2.4, leukocytosis at 16.3.  Troponin 20 EKG sinus rhythm, chronic ST changes on multiple leads.  Patient was given 80 mg of IV Lasix in ED and started on IV diuresis. Chest pain appears to be musculoskeletal so he was given a trial of lidocaine patch and Flexeril.  8/22: Overnight persistent bandlike chest pain, which increased with body movements and deep breathing, D-dimer was checked and found to be elevated, patient also has CKD stage IV, VQ scan was ordered by night on-call. Repeat troponin 43, A1c 11.3, slight worsening of renal function with BUN 48 and creatinine 3.21, worsening leukocytosis at 20.6.  BNP normal. Holding IV Lasix today, procalcitonin 0.36, COVID PCR negative.  Significant orthopnea and unable to lay down for any scan.  [facility] cardiology was consulted.  Starting him on ceftriaxone and Zithromax for CAP coverage.  8/23: Patient overnight became more dyspneic and febrile at 103.  ABG with mild hypercapnia and hypoxia, due to increased work of breathing he was placed on BiPAP. Respiratory viral panel and MRSA PCR negative.  Worsening leukocytosis and procalcitonin today.  Urine has not been sent to lab despite  multiple reminders. Ordered blood and sputum culture and switching ceftriaxone with cefepime.  UOP of 2450 recorded, creatinine worsening at 3.27.  Cardiology gave another dose of IV Lasix 80 mg yesterday. Patient now meeting sepsis criteria likely secondary to pneumonia, still unable to obtain further imaging.  Ordered some IV fluid, holding Lasix.  8/24: Vital stable, VQ scan negative for PE, renal ultrasound without any acute abnormality.  Chest CT with cardiomegaly, bibasilar atelectasis, diffuse thyroid enlargement with recommendation of nonemergent thyroid ultrasound which can be done by his PCP.  Labs with improving leukocytosis, sodium 133, small improvement to creatinine at 3.12 today.  Remained on 8 L of oxygen.  Home dose of torsemide was restarted today.  8/25: Renal functions and leukocytosis improving, on 5 L of oxygen, UOP of 2100.  PT and OT recommending SNF.  TOC to find placement.  8/26: Hemodynamically stable, will complete a 5-day course of antibiotic today.  Continue to have some intermittent crampy lower chest pain, PE and ACS has been ruled out, likely musculoskeletal.  Patient can try muscle relaxant and supportive care. [facility]-pending insurance authorization.  8/27: 1/4 prior blood culture bottles started turning positive for staph lugdunensis, likely contaminant but patient did had leukocytosis and was febrile, was treated did for concern of pneumonia although there was no infiltrate on chest x-ray but he does has upper respiratory symptoms.  Discussed with ID and they recommend repeating blood culture and starting on cefazolin.  No hardware.  Repeating blood cultures.  Did obtain insurance authorization, ID was also consulted.  8/28: Because of his persistent back  pain MRI thoracic spine was obtained and it came back positive for T5-T4 discitis, osteomyelitis, some associated epidural and paraspinal phlegmon but no drainable fluid collection.  Case was also  discussed with IR and they do not think that there is anything to drain at this time.  Patient will need at least 6 weeks of IV antibiotics-ID to decide.  If repeat blood cultures remain negative by tomorrow we will order PICC line and patient should be able to go to his rehab before Friday.  8/29: Repeat blood cultures remain negative.  PICC line was ordered but nephrology would like to get him a tunnel catheter with vascular surgery-they will place the catheter tomorrow morning.  Patient will be going to facility on daptomycin until 05/26/2023 per ID.  Susceptibility with oxacillin resistance.  Assessment and Plan: * Sepsis due to pneumonia (HCC) Staphylococcus lugdunensis bacteremia. T5-T6 discitis and osteomyelitis. Patient now meeting sepsis criteria with fever, worsening leukocytosis, and tachypnea. 1/4 anaerobic blood cultures from 8/23 became positive after 4 days with staph lugdunensis, MRI thoracic spine concern of discitis and osteomyelitis at T5-T6 level but there is no drainable fluid to confirm. In initially received 5 days of cefepime and Zithromycin for concern of pneumonia Started on cefazolin due to positive blood cultures after 60 hours, susceptibility back with oxacillin resistance so antibiotics switched to daptomycin Repeat blood cultures were sent on 04/18/2023-negative so far -Patient will need 6 weeks of IV antibiotics-vascular to place tunneled catheter and he will continue with daptomycin until 05/26/2023 per ID   Acute on chronic systolic congestive heart failure (HCC) Okay patient was initially admitted with concern of acute on chronic HFrEF, prior echocardiogram done in 2020 with EF of 40 to 45%.  Repeat echo with normal EF, indeterminate diastolic function. BNP normal and patient has chronic lower extremity edema with signs of chronic venous congestion and stasis.  Do not weigh himself regularly.  Chest x-ray without any pulmonary congestion.  Unable to check JVD due to  body habitus. Received IV Lasix which resulted in worsening of creatinine. IV Lasix was initially held and he was started on home torsemide twice daily dose which again resulted in some increase in creatinine. -Decreasing the dose of torsemide to daily by nephrology -Cardiology consult at his request  Leukocytosis Worsening leukocytosis after improving.  Procalcitonin at 0.36>>2.02. Chest x-ray with questionable infiltrate/atelectasis. Likely secondary to bacteremia, received 5-day treatment of pneumonia. Thoracic MRI with concern of T5-T6 discitis and osteomyelitis -Treatment as mentioned above  Chest pain Improved Patient has atypical bandlike chest spasms, increased with any body movement and deep breathing.  Mildly positive troponin likely secondary to demand. -Echocardiogram with improved EF and no wall motion abnormalities  Essential hypertension Blood pressure within goal. -Continue home carvedilol, Cardura, Imdur.  Paroxysmal atrial fibrillation (HCC) Not on any anticoagulation due to history of GI bleed. -Continue amiodarone and aspirin  Chronic kidney disease, stage 4 (severe) (HCC) Likely AKI with history of CKD stage IV.  Creatinine with slight worsening today,   Baseline appears to be around 2.5.   -Nephrology restarted home torsemide on 8/24, decreasing the dose to daily. -Monitor renal function -Avoid nephrotoxins   Chronic obstructive pulmonary disease (HCC) No wheezing.  Slightly increased cough from his baseline. -Continue home bronchodilators -Patient likely is oxygen dependent, apparently had it at home but was not using regularly  Type 2 diabetes mellitus with chronic kidney disease, with long-term current use of insulin (HCC) Uncontrolled with hyperglycemia and A1c of 11.3.  CBG  improved today  Patient is on significant amount of insulin at home. -Continue Semglee to 35 units twice daily instead of 60 daily. -Decreasing mealtime coverage to 5  unit -Continue SSI  Obesity, Class III, BMI 40-49.9 (morbid obesity) (HCC) Estimated body mass index is 48.66 kg/m as calculated from the following:   Height as of this encounter: 5\' 8"  (1.727 m).   Weight as of this encounter: 145.2 kg.   This will complicate overall prognosis Encouraged weight loss  Obstructive sleep apnea of adult - BiPAP at night, patient is mostly noncompliant at home   Subjective: Patient continued to have some mid back pain especially more while sitting.  Physical Exam: Vitals:   04/19/23 2000 04/20/23 0206 04/20/23 0734 04/20/23 1515  BP: (!) 104/53 128/70 138/69 123/60  Pulse: 72 73 75 76  Resp: 18 20 20 19   Temp: 97.9 F (36.6 C) 98.3 F (36.8 C) 97.7 F (36.5 C) 98.6 F (37 C)  TempSrc: Oral Oral Oral Oral  SpO2: 98% 92% 99% 96%  Weight:      Height:       General.  Morbidly obese gentleman, in no acute distress. Pulmonary.  Lungs clear bilaterally, normal respiratory effort. CV.  Regular rate and rhythm, no JVD, rub or murmur. Abdomen.  Soft, nontender, nondistended, BS positive. CNS.  Alert and oriented .  No focal neurologic deficit. Extremities.  1+ LE edema, pulses intact, signs of chronic venous congestion Psychiatry.  Judgment and insight appears normal.    Data Reviewed: Prior data reviewed  Family Communication: Discussed with sister and brother-in-law at bedside  Disposition: Status is: Inpatient Remains inpatient appropriate because: Severity of illness  Planned Discharge Destination: Home  DVT prophylaxis.  Lovenox Time spent: 44 minutes  This record has been created using Conservation officer, historic buildings. Errors have been sought and corrected,but may not always be located. Such creation errors do not reflect on the standard of care.   Author: Arnetha Courser, MD 04/20/2023 4:04 PM  For on call review [email or web address].

## 2023-04-20 NOTE — Progress Notes (Signed)
PHARMACIST - PHYSICIAN COMMUNICATION  CONCERNING:  Enoxaparin (Lovenox) for DVT Prophylaxis    RECOMMENDATION: Patient was prescribed enoxaprin 75 mg q24 hours (0.5 mg/kg) for VTE prophylaxis.   Filed Weights   04/13/23 0153 04/15/23 0500  Weight: (!) 145.2 kg (320 lb) (!) 147.8 kg (325 lb 13.4 oz)    Body mass index is 49.54 kg/m.  Estimated Creatinine Clearance: 28.8 mL/min (A) (by C-G formula based on SCr of 3.38 mg/dL (H)).  Patient is candidate for enoxaparin 30mg  every 24 hours based on CrCl <5ml/min or Weight <45kg  DESCRIPTION: Pharmacy has adjusted enoxaparin dose per [facility] policy.  Patient is now receiving enoxaparin 30 mg every 24 hours    Effie Shy, PharmD PGY1 Pharmacy Resident 04/20/2023 7:30 AM

## 2023-04-21 ENCOUNTER — Encounter
Admission: EM | Disposition: A | Discharge status: Skilled Nursing Facility | Payer: Self-pay | Source: Home / Self Care | Attending: Internal Medicine

## 2023-04-21 DIAGNOSIS — N189 Chronic kidney disease, unspecified: Secondary | ICD-10-CM | POA: Diagnosis not present

## 2023-04-21 DIAGNOSIS — I480 Paroxysmal atrial fibrillation: Secondary | ICD-10-CM | POA: Diagnosis not present

## 2023-04-21 DIAGNOSIS — I509 Heart failure, unspecified: Secondary | ICD-10-CM | POA: Diagnosis not present

## 2023-04-21 DIAGNOSIS — Z794 Long term (current) use of insulin: Secondary | ICD-10-CM | POA: Diagnosis not present

## 2023-04-21 DIAGNOSIS — R809 Proteinuria, unspecified: Secondary | ICD-10-CM | POA: Diagnosis not present

## 2023-04-21 DIAGNOSIS — Z20822 Contact with and (suspected) exposure to covid-19: Secondary | ICD-10-CM | POA: Diagnosis not present

## 2023-04-21 DIAGNOSIS — I4891 Unspecified atrial fibrillation: Secondary | ICD-10-CM | POA: Diagnosis not present

## 2023-04-21 DIAGNOSIS — M4624 Osteomyelitis of vertebra, thoracic region: Secondary | ICD-10-CM | POA: Diagnosis not present

## 2023-04-21 DIAGNOSIS — E1169 Type 2 diabetes mellitus with other specified complication: Secondary | ICD-10-CM | POA: Diagnosis not present

## 2023-04-21 DIAGNOSIS — Z452 Encounter for adjustment and management of vascular access device: Secondary | ICD-10-CM | POA: Diagnosis not present

## 2023-04-21 DIAGNOSIS — E569 Vitamin deficiency, unspecified: Secondary | ICD-10-CM | POA: Diagnosis not present

## 2023-04-21 DIAGNOSIS — E871 Hypo-osmolality and hyponatremia: Secondary | ICD-10-CM | POA: Diagnosis not present

## 2023-04-21 DIAGNOSIS — E785 Hyperlipidemia, unspecified: Secondary | ICD-10-CM | POA: Diagnosis not present

## 2023-04-21 DIAGNOSIS — M4640 Discitis, unspecified, site unspecified: Secondary | ICD-10-CM

## 2023-04-21 DIAGNOSIS — R0989 Other specified symptoms and signs involving the circulatory and respiratory systems: Secondary | ICD-10-CM | POA: Diagnosis not present

## 2023-04-21 DIAGNOSIS — Z6841 Body Mass Index (BMI) 40.0 and over, adult: Secondary | ICD-10-CM | POA: Diagnosis not present

## 2023-04-21 DIAGNOSIS — M6259 Muscle wasting and atrophy, not elsewhere classified, multiple sites: Secondary | ICD-10-CM | POA: Diagnosis not present

## 2023-04-21 DIAGNOSIS — E118 Type 2 diabetes mellitus with unspecified complications: Secondary | ICD-10-CM | POA: Diagnosis not present

## 2023-04-21 DIAGNOSIS — J189 Pneumonia, unspecified organism: Secondary | ICD-10-CM | POA: Diagnosis not present

## 2023-04-21 DIAGNOSIS — R079 Chest pain, unspecified: Secondary | ICD-10-CM | POA: Diagnosis present

## 2023-04-21 DIAGNOSIS — D649 Anemia, unspecified: Secondary | ICD-10-CM | POA: Diagnosis not present

## 2023-04-21 DIAGNOSIS — K5909 Other constipation: Secondary | ICD-10-CM | POA: Diagnosis not present

## 2023-04-21 DIAGNOSIS — E7800 Pure hypercholesterolemia, unspecified: Secondary | ICD-10-CM | POA: Diagnosis not present

## 2023-04-21 DIAGNOSIS — I2510 Atherosclerotic heart disease of native coronary artery without angina pectoris: Secondary | ICD-10-CM | POA: Diagnosis not present

## 2023-04-21 DIAGNOSIS — S81809D Unspecified open wound, unspecified lower leg, subsequent encounter: Secondary | ICD-10-CM | POA: Diagnosis not present

## 2023-04-21 DIAGNOSIS — R739 Hyperglycemia, unspecified: Secondary | ICD-10-CM | POA: Diagnosis not present

## 2023-04-21 DIAGNOSIS — A412 Sepsis due to unspecified staphylococcus: Secondary | ICD-10-CM | POA: Diagnosis not present

## 2023-04-21 DIAGNOSIS — I517 Cardiomegaly: Secondary | ICD-10-CM | POA: Diagnosis not present

## 2023-04-21 DIAGNOSIS — N184 Chronic kidney disease, stage 4 (severe): Secondary | ICD-10-CM | POA: Diagnosis not present

## 2023-04-21 DIAGNOSIS — R7881 Bacteremia: Secondary | ICD-10-CM | POA: Diagnosis not present

## 2023-04-21 DIAGNOSIS — N2581 Secondary hyperparathyroidism of renal origin: Secondary | ICD-10-CM | POA: Diagnosis not present

## 2023-04-21 DIAGNOSIS — I129 Hypertensive chronic kidney disease with stage 1 through stage 4 chronic kidney disease, or unspecified chronic kidney disease: Secondary | ICD-10-CM | POA: Diagnosis not present

## 2023-04-21 DIAGNOSIS — N400 Enlarged prostate without lower urinary tract symptoms: Secondary | ICD-10-CM | POA: Diagnosis not present

## 2023-04-21 DIAGNOSIS — D72829 Elevated white blood cell count, unspecified: Secondary | ICD-10-CM | POA: Diagnosis not present

## 2023-04-21 DIAGNOSIS — N179 Acute kidney failure, unspecified: Secondary | ICD-10-CM | POA: Diagnosis not present

## 2023-04-21 DIAGNOSIS — I130 Hypertensive heart and chronic kidney disease with heart failure and stage 1 through stage 4 chronic kidney disease, or unspecified chronic kidney disease: Secondary | ICD-10-CM | POA: Diagnosis not present

## 2023-04-21 DIAGNOSIS — Z79899 Other long term (current) drug therapy: Secondary | ICD-10-CM | POA: Diagnosis not present

## 2023-04-21 DIAGNOSIS — G4733 Obstructive sleep apnea (adult) (pediatric): Secondary | ICD-10-CM | POA: Diagnosis not present

## 2023-04-21 DIAGNOSIS — A419 Sepsis, unspecified organism: Secondary | ICD-10-CM | POA: Diagnosis not present

## 2023-04-21 DIAGNOSIS — K219 Gastro-esophageal reflux disease without esophagitis: Secondary | ICD-10-CM | POA: Diagnosis not present

## 2023-04-21 DIAGNOSIS — R600 Localized edema: Secondary | ICD-10-CM | POA: Diagnosis not present

## 2023-04-21 DIAGNOSIS — R2681 Unsteadiness on feet: Secondary | ICD-10-CM | POA: Diagnosis not present

## 2023-04-21 DIAGNOSIS — E1122 Type 2 diabetes mellitus with diabetic chronic kidney disease: Secondary | ICD-10-CM | POA: Diagnosis not present

## 2023-04-21 DIAGNOSIS — M79604 Pain in right leg: Secondary | ICD-10-CM | POA: Diagnosis not present

## 2023-04-21 DIAGNOSIS — J9811 Atelectasis: Secondary | ICD-10-CM | POA: Diagnosis not present

## 2023-04-21 DIAGNOSIS — M109 Gout, unspecified: Secondary | ICD-10-CM | POA: Diagnosis not present

## 2023-04-21 DIAGNOSIS — I5023 Acute on chronic systolic (congestive) heart failure: Secondary | ICD-10-CM | POA: Diagnosis not present

## 2023-04-21 DIAGNOSIS — M4644 Discitis, unspecified, thoracic region: Secondary | ICD-10-CM | POA: Diagnosis not present

## 2023-04-21 DIAGNOSIS — R279 Unspecified lack of coordination: Secondary | ICD-10-CM | POA: Diagnosis not present

## 2023-04-21 DIAGNOSIS — Z7982 Long term (current) use of aspirin: Secondary | ICD-10-CM | POA: Diagnosis not present

## 2023-04-21 DIAGNOSIS — E039 Hypothyroidism, unspecified: Secondary | ICD-10-CM | POA: Diagnosis not present

## 2023-04-21 DIAGNOSIS — Z7951 Long term (current) use of inhaled steroids: Secondary | ICD-10-CM | POA: Diagnosis not present

## 2023-04-21 DIAGNOSIS — I5032 Chronic diastolic (congestive) heart failure: Secondary | ICD-10-CM | POA: Diagnosis not present

## 2023-04-21 DIAGNOSIS — J449 Chronic obstructive pulmonary disease, unspecified: Secondary | ICD-10-CM | POA: Diagnosis not present

## 2023-04-21 DIAGNOSIS — I959 Hypotension, unspecified: Secondary | ICD-10-CM | POA: Diagnosis not present

## 2023-04-21 DIAGNOSIS — I10 Essential (primary) hypertension: Secondary | ICD-10-CM | POA: Diagnosis not present

## 2023-04-21 DIAGNOSIS — R0789 Other chest pain: Secondary | ICD-10-CM | POA: Diagnosis not present

## 2023-04-21 DIAGNOSIS — E119 Type 2 diabetes mellitus without complications: Secondary | ICD-10-CM | POA: Diagnosis not present

## 2023-04-21 DIAGNOSIS — Z7984 Long term (current) use of oral hypoglycemic drugs: Secondary | ICD-10-CM | POA: Diagnosis not present

## 2023-04-21 DIAGNOSIS — R0602 Shortness of breath: Secondary | ICD-10-CM | POA: Diagnosis not present

## 2023-04-21 DIAGNOSIS — J45909 Unspecified asthma, uncomplicated: Secondary | ICD-10-CM | POA: Diagnosis not present

## 2023-04-21 DIAGNOSIS — I5043 Acute on chronic combined systolic (congestive) and diastolic (congestive) heart failure: Secondary | ICD-10-CM | POA: Diagnosis not present

## 2023-04-21 DIAGNOSIS — Z743 Need for continuous supervision: Secondary | ICD-10-CM | POA: Diagnosis not present

## 2023-04-21 HISTORY — PX: CENTRAL LINE INSERTION-TUNNELED: CATH118291

## 2023-04-21 LAB — RENAL FUNCTION PANEL
Albumin: 3 g/dL — ABNORMAL LOW (ref 3.5–5.0)
Anion gap: 14 (ref 5–15)
BUN: 103 mg/dL — ABNORMAL HIGH (ref 8–23)
CO2: 27 mmol/L (ref 22–32)
Calcium: 8.7 mg/dL — ABNORMAL LOW (ref 8.9–10.3)
Chloride: 89 mmol/L — ABNORMAL LOW (ref 98–111)
Creatinine, Ser: 3.46 mg/dL — ABNORMAL HIGH (ref 0.61–1.24)
GFR, Estimated: 18 mL/min — ABNORMAL LOW (ref >60)
Glucose, Bld: 139 mg/dL — ABNORMAL HIGH (ref 70–99)
Phosphorus: 4.6 mg/dL (ref 2.5–4.6)
Potassium: 3.5 mmol/L (ref 3.5–5.1)
Sodium: 130 mmol/L — ABNORMAL LOW (ref 135–145)

## 2023-04-21 LAB — CBC
HCT: 34.9 % — ABNORMAL LOW (ref 39.0–52.0)
Hemoglobin: 11.3 g/dL — ABNORMAL LOW (ref 13.0–17.0)
MCH: 31.1 pg (ref 26.0–34.0)
MCHC: 32.4 g/dL (ref 30.0–36.0)
MCV: 96.1 fL (ref 80.0–100.0)
Platelets: 257 10*3/uL (ref 150–400)
RBC: 3.63 MIL/uL — ABNORMAL LOW (ref 4.22–5.81)
RDW: 13.8 % (ref 11.5–15.5)
WBC: 16.7 10*3/uL — ABNORMAL HIGH (ref 4.0–10.5)
nRBC: 0 % (ref 0.0–0.2)

## 2023-04-21 LAB — C-REACTIVE PROTEIN: CRP: 8 mg/dL — ABNORMAL HIGH

## 2023-04-21 LAB — SEDIMENTATION RATE: Sed Rate: 119 mm/h — ABNORMAL HIGH (ref 0–20)

## 2023-04-21 LAB — GLUCOSE, CAPILLARY
Glucose-Capillary: 150 mg/dL — ABNORMAL HIGH (ref 70–99)
Glucose-Capillary: 124 mg/dL — ABNORMAL HIGH (ref 70–99)
Glucose-Capillary: 101 mg/dL — ABNORMAL HIGH (ref 70–99)

## 2023-04-21 LAB — ALBUMIN: Albumin: 3 g/dL — ABNORMAL LOW (ref 3.5–5.0)

## 2023-04-21 SURGERY — CENTRAL LINE INSERTION-TUNNELED
Anesthesia: Moderate Sedation

## 2023-04-21 MED ORDER — MIDAZOLAM HCL 5 MG/5ML IJ SOLN
INTRAMUSCULAR | Status: AC
  Filled 2023-04-21: qty 5

## 2023-04-21 MED ORDER — AMIODARONE HCL 200 MG PO TABS: 200.0000 mg | ORAL_TABLET | Freq: Every day | ORAL | 0 refills | Status: AC

## 2023-04-21 MED ORDER — INSULIN GLARGINE 100 UNIT/ML SOLOSTAR PEN: 30.0000 [IU] | PEN_INJECTOR | Freq: Two times a day (BID) | SUBCUTANEOUS | 0 refills | Status: DC

## 2023-04-21 MED ORDER — MIDAZOLAM HCL 2 MG/2ML IJ SOLN
INTRAMUSCULAR | Status: DC | PRN
  Administered 2023-04-21: 1 mg via INTRAVENOUS
  Administered 2023-04-21: .5 mg via INTRAVENOUS
  Administered 2023-04-21: 1 mg via INTRAVENOUS
  Administered 2023-04-21: .5 mg via INTRAVENOUS

## 2023-04-21 MED ORDER — OXYCODONE HCL 10 MG PO TABS: 10.0000 mg | ORAL_TABLET | Freq: Four times a day (QID) | ORAL | 0 refills | Status: AC | PRN

## 2023-04-21 MED ORDER — INSULIN ASPART 100 UNIT/ML FLEXPEN: 5.0000 [IU] | PEN_INJECTOR | Freq: Three times a day (TID) | SUBCUTANEOUS | 0 refills | Status: DC

## 2023-04-21 MED ORDER — LIDOCAINE 5 % EX PTCH: 1.0000 | MEDICATED_PATCH | CUTANEOUS | 0 refills | Status: DC

## 2023-04-21 MED ORDER — SPIRONOLACTONE 12.5 MG HALF TABLET: 12.5000 mg | ORAL_TABLET | Freq: Every day | ORAL | Status: DC

## 2023-04-21 MED ORDER — LINAGLIPTIN 5 MG PO TABS: 5.0000 mg | ORAL_TABLET | Freq: Every day | ORAL | 0 refills | Status: DC

## 2023-04-21 MED ORDER — TORSEMIDE 20 MG PO TABS: 40.0000 mg | ORAL_TABLET | Freq: Every day | ORAL | 0 refills | Status: DC

## 2023-04-21 MED ORDER — CEFAZOLIN SODIUM-DEXTROSE 2-4 GM/100ML-% IV SOLN
INTRAVENOUS | Status: AC
  Filled 2023-04-21: qty 100

## 2023-04-21 MED ORDER — DAPTOMYCIN IV (FOR PTA / DISCHARGE USE ONLY): 800.0000 mg | INTRAVENOUS | 0 refills | Status: DC

## 2023-04-21 MED ORDER — CEFTRIAXONE IV (FOR PTA / DISCHARGE USE ONLY): 2.0000 g | INTRAVENOUS | 0 refills | Status: DC

## 2023-04-21 MED ORDER — SPIRONOLACTONE 25 MG PO TABS: 12.5000 mg | ORAL_TABLET | Freq: Every day | ORAL | 0 refills | Status: DC

## 2023-04-21 MED ORDER — FENTANYL CITRATE (PF) 100 MCG/2ML IJ SOLN
INTRAMUSCULAR | Status: DC | PRN
  Administered 2023-04-21: 12.5 ug via INTRAVENOUS
  Administered 2023-04-21: 50 ug via INTRAVENOUS
  Administered 2023-04-21: 12.5 ug via INTRAVENOUS

## 2023-04-21 MED ORDER — FENTANYL CITRATE (PF) 100 MCG/2ML IJ SOLN
INTRAMUSCULAR | Status: AC
  Filled 2023-04-21: qty 2

## 2023-04-21 SURGICAL SUPPLY — 9 items
ADH SKN CLS APL DERMABOND .7 (GAUZE/BANDAGES/DRESSINGS) ×1
COVER PROBE ULTRASOUND 5X96 (MISCELLANEOUS) IMPLANT
DERMABOND ADVANCED .7 DNX12 (GAUZE/BANDAGES/DRESSINGS) IMPLANT
KIT SINGLE LUMEN POWERLINE 5FR (CATHETERS) IMPLANT
NDL ENTRY 21GA 7CM ECHOTIP (NEEDLE) IMPLANT
NEEDLE ENTRY 21GA 7CM ECHOTIP (NEEDLE) ×1 IMPLANT
SET INTRO CAPELLA COAXIAL (SET/KITS/TRAYS/PACK) IMPLANT
SUT MNCRL AB 4-0 PS2 18 (SUTURE) IMPLANT
SUT SILK 0 FSL (SUTURE) IMPLANT

## 2023-04-21 NOTE — Progress Notes (Signed)
[facility] HF Stewardship  PCP: Enid Baas, MD  PCP-Cardiologist: None  HPI: Jason Hudson is a 71 y.o. male with chronic HFrEF with LVEF previously 40-45%, PAF not on anticoagulation due to history of severe GI bleed, CKD stage IV, COPD, T2DM with insulin resistance, gout, hypothyroidism, BPH, morbid obesity  who presented with shortness of breath, leg swelling and chest pain. Underwent LHC in 2019 showing 70% stenosis of Ost 1st Sept, 50% of Ost 2nd Mrg, and 100% Mid RCA stenosis with collaterals treated with medical management. LVEDP at that time was ~17. TTE in 2019 showed LVEF of 50-55%, which decreased in 2020 to 40-45%. MRI in 02/2023 showed improvement in LVEF to 66%. Echocardiogram 04/13/23 showed LVEF of 55-60%.Troponin on presentation was minimally elevated and BNP was WNL. Symptoms due to pneumonia, which are being treated with azithromycin and cefepime. Found to have S. lugdunensis in 1/4 bottles being treated with cefazolin. MRI 8/28 showed T5-T6 osteomyelitis.  Pertinent Lab Values: Creatinine, Ser  Date Value Ref Range Status  04/21/2023 3.46 (H) 0.61 - 1.24 mg/dL Final   BUN  Date Value Ref Range Status  04/21/2023 103 (H) 8 - 23 mg/dL Final    Comment:    RESULT CONFIRMED BY MANUAL DILUTION ASW  10/21/2020 35 (H) 8 - 27 mg/dL Final   Potassium  Date Value Ref Range Status  04/21/2023 3.5 3.5 - 5.1 mmol/L Final   Sodium  Date Value Ref Range Status  04/21/2023 130 (L) 135 - 145 mmol/L Final  10/21/2020 137 134 - 144 mmol/L Final   B Natriuretic Peptide  Date Value Ref Range Status  04/12/2023 89.7 0.0 - 100.0 pg/mL Final    Comment:    Performed at [facility], [address] [place], [place] [address]   Magnesium  Date Value Ref Range Status  04/14/2023 1.6 (L) 1.7 - 2.4 mg/dL Final    Comment:    Performed at [facility], [address] [place], [place] [address]   Hgb A1c MFr Bld  Date Value Ref Range Status  04/13/2023  11.3 (H) 4.8 - 5.6 % Final    Comment:    (NOTE) Pre diabetes:          5.7%-6.4%  Diabetes:              >6.4%  Glycemic control for   <7.0% adults with diabetes    TSH  Date Value Ref Range Status  10/21/2020 16.300 (H) 0.450 - 4.500 uIU/mL Final    Vital Signs: Temp:  [97.7 F (36.5 C)-98.6 F (37 C)] 97.9 F (36.6 C) (08/30 0808) Pulse Rate:  [69-76] 69 (08/30 0808) Cardiac Rhythm: Normal sinus rhythm (08/30 0700) Resp:  [16-20] 16 (08/30 0808) BP: (99-123)/(58-68) 118/59 (08/30 0808) SpO2:  [96 %-98 %] 97 % (08/30 0808) Weight:  [100.1 kg (220 lb 10.9 oz)] 100.1 kg (220 lb 10.9 oz) (08/30 0403)   Intake/Output Summary (Last 24 hours) at 04/21/2023 0953 Last data filed at 04/21/2023 0853 Gross per 24 hour  Intake 1214.55 ml  Output 2250 ml  Net -1035.45 ml    Current Inpatient Medications:  Loop Diuretic: Torsemide 40 mg BID Beta Blocker: Carvedilol 6.25 mg BID     Mineralocorticoid Receptor Antagonist (MRA): Spironolactone 25 mg daily     Other vasoactive medications include isosorbide mononitrate 30 mg daily  Prior to admission HF Medications:  Carvedilol 6.25 mg BID Torsemide 80 mg daily Metolazone 2.5 mg as needed  Assessment: 1. Chronic heart  failure (LVEF 40-45% in 2020, now improved to 55-60%), due to NICM.  -Remains on intermittent O2. Still with crackles at bases per nephrology. No LEE.  -Creatinine and BUN continue to trend up. Good urine output. Urine color yellow. Albumin mildly reduced at 3. Given worsening creatinine and continued lung crackles, may be intravascularly dry with residual extravascular fluid. Torsemide reduced to daily. -BP WNL with one low reading overnight. Will need to monitor K closely with spironolactone in creatinine >2.5. Hesitant to increase carvedilol given COPD. Hesistant to add back ARB/ARNI given renal function.    Plan: 1) Medication changes recommended at this time: -None. Awaiting Jardiance patient assitance  approval.  2) Patient assistance: Sherryll Burger copay is [phone] (in Medicare coverage gap) -Farxiga copay $139.63 (in Medicare coverage gap) -Patient assistance for Jardiance initiated. Requesting income verification.  3) Education: -- Patient has been educated on current HF medications and potential additions to HF medication regimen - Patient verbalizes understanding that over the next few months, these medication doses may change and more medications may be added to optimize HF regimen - Patient has been educated on basic disease state pathophysiology and goals of therapy  Medication Assistance / Insurance Benefits Check:  Does the patient have prescription insurance? Medicare   Type of insurance plan:   Does the patient qualify for medication assistance through manufacturers or grants?  Submitted Jardiance patient assistance at request of nephrology. Will have them sign the form, then submit. Nephrology will manage Jardiance outpatient.  Outpatient Pharmacy:  Prior to admission outpatient pharmacy: [facility]     Thank you for involving pharmacy in this patient's care.  Enos Fling, PharmD, BCPS Phone - [phone] Clinical Pharmacist 04/21/2023 9:53 AM

## 2023-04-21 NOTE — Interval H&P Note (Signed)
History and Physical Interval Note:  04/21/2023 3:48 PM  Jason Hudson  has presented today for surgery, with the diagnosis of Infection.  The various methods of treatment have been discussed with the patient and family. After consideration of risks, benefits and other options for treatment, the patient has consented to  Procedure(s): CENTRAL LINE INSERTION-TUNNELED (N/A) as a surgical intervention.  The patient's history has been reviewed, patient examined, no change in status, stable for surgery.  I have reviewed the patient's chart and labs.  Questions were answered to the patient's satisfaction.     [name]

## 2023-04-21 NOTE — Progress Notes (Signed)
Pt d/c via wheelchair with family. Discharge packet given to pt. Family to take pt to [facility], [facility] called report to [facility].

## 2023-04-21 NOTE — TOC Transition Note (Signed)
Transition of Care [facility]) - CM/SW Discharge Note   Patient Details  Name: Jason Hudson MRN: [id number] Date of Birth: [birth year]  [facility] CM/SW Contact:  Chapman Fitch, RN Phone Number: 04/21/2023, 3:12 PM   Clinical Narrative:     Patient to discharge today Patient will DC to: [facility] Anticipated DC date: 04/21/23  Family notified: Sister Lu ann at bedside Transport by: Sister to transport  Per MD patient ready for DC to . RN, patient, patient's family, and facility notified of DC. Discharge Summary sent to facility. RN given number for report. DC packet on chart.  Confirmed with Tammy at [facility] that they will have the IV antibiotics to administer tomorrow   TOC signing off.     Barriers to Discharge: Continued Medical Work up   Patient Goals and CMS Choice      Discharge Placement                         Discharge Plan and Services Additional resources added to the After Visit Summary for                                       Social Determinants of Health (SDOH) Interventions SDOH Screenings   Food Insecurity: No Food Insecurity (04/13/2023)  Housing: Low Risk  (04/13/2023)  Transportation Needs: No Transportation Needs (04/13/2023)  Utilities: Not At Risk (04/13/2023)  Alcohol Screen: Low Risk  (05/11/2022)  Depression (PHQ2-9): Low Risk  (05/11/2022)  Tobacco Use: Low Risk  (04/13/2023)     Readmission Risk Interventions    04/14/2023   10:31 AM  Readmission Risk Prevention Plan  Transportation Screening Complete  PCP or Specialist Appt within 3-5 Days Complete  HRI or Home Care Consult Complete  Social Work Consult for Recovery Care Planning/Counseling Complete  Palliative Care Screening Not Complete  Medication Review Oceanographer) Complete

## 2023-04-21 NOTE — Op Note (Signed)
[facility]   OPERATIVE NOTE     PROCEDURE: 1. Insertion of a right IJ tunneled Hickman catheter. 2. Catheter placement and cannulation under ultrasound and fluoroscopic guidance  PRE-OPERATIVE DIAGNOSIS: Discitis with sepsis requiring prolonged antibiotics  POST-OPERATIVE DIAGNOSIS: same as above  SURGEON: [name]  ANESTHESIA: Conscious sedation was administered under my direct supervision by the interventional radiology RN. IV Versed plus fentanyl were utilized. Continuous ECG, pulse oximetry and blood pressure was monitored throughout the entire procedure.  Conscious sedation was for a total of 38 minutes.  ESTIMATED BLOOD LOSS: Minimal  FINDING(S): 1.  Tips of the catheter in the right atrium on fluoroscopy 2.  No obvious pneumothorax on fluoroscopy  SPECIMEN(S):  none  INDICATIONS:   Jason Hudson is a 72 y.o. male  presents with discitis and sepsis.  Therefore, the patient requires a tunneled Hickman catheter placement.  The patient is informed of  the risks catheter placement include but are not limited to: bleeding, infection, central venous injury, pneumothorax, possible venous stenosis, possible malpositioning in the venous system, and possible infections related to long-term catheter presence.  The patient was aware of these risks and agreed to proceed.  DESCRIPTION: The patient was taken back to Special Procedure suite.  Prior to sedation, the patient was given IV antibiotics.  After obtaining adequate sedation, the patient was prepped and draped in the standard fashion for a right IJ tunneled Hickman catheter placement.  Appropriate Time Out is called.     The right neck and chest wall are then infiltrated with 1% Lidocaine with epinepherine.  A Hickman catheter is then selected, opened on the back table and prepped. Ultrasound is placed in a sterile sleeve.  Under ultrasound guidance, the right IJ vein is examined and is noted to  be echolucent and easily compressible indicating patency.   An image is recorded for the permanent record.  The right IJ vein is cannulated with the microneedle under direct ultrasound vissualization.  A Microwire followed by a micro sheath is then inserted without difficulty.   A J-wire was then advanced under fluoroscopic guidance into the inferior vena cava and the wire was secured.  Small counter incision was then made at the wire insertion site. A small pocket was fashioned with blunt dissection to allow easier passage of the cuff.  The dilator and peel-away sheath are then advanced over the wire under fluoroscopic guidance. The catheters and advanced through the peel-away sheath after it is approximated to the right chest wall after verifying the tips at the atrial caval junction and it has been tunneled from the exit site.  The  port was tested by aspirating and flushing.  No resistance was noted.  The port was then thoroughly flushed with heparinized saline.  The catheter was secured in placed with two interrupted stitches of 0 silk tied to the catheter.  The counter incision was closed with a U-stitch of 4-0 Monocryl.  The insertion site is then cleaned and sterile bandages applied including a Biopatch.  On completion fluoroscopy, the tips of the catheter were in the right atrium, and there was no evidence of pneumothorax.  COMPLICATIONS: None  CONDITION: Unchanged   [name] [facility] Office: [phone]   04/21/2023, 5:38 PM

## 2023-04-21 NOTE — Consult Note (Signed)
   [facility] Inpatient Consult   04/21/2023  Augusta Propson [facility] [birth year] [id number]  Update discharge disposition:  Pt will discharge to SNF as liaison will collaborate with the PAC-RN as facility will manage pt's needs.  Elliot Cousin, RN, [facility] Liaison [place]   Population Health Office Hours MTWF  8:00 am-6:00 pm [phone] mobile [phone] [Office toll free line] Office Hours are M-F 8:30 - 5 pm [email or web address]

## 2023-04-21 NOTE — Progress Notes (Signed)
   04/15/23 2038  BiPAP/CPAP/SIPAP  $ Non-Invasive Ventilator  Non-Invasive Vent Subsequent  BiPAP/CPAP/SIPAP Pt Type Adult  BiPAP/CPAP/SIPAP V60  Reason BIPAP/CPAP not in use Non-compliant (pt refused to wear cpap for tonight)  Mask Type Full face mask  Mask Size Large  Set Rate 10 breaths/min  IPAP 15 cmH20  EPAP 5 cmH2O  FiO2 (%) 45 %  BiPAP/CPAP /SiPAP Vitals  Resp (!) 32  MEWS Score/Color  MEWS Score 2  MEWS Score Color Yellow

## 2023-04-21 NOTE — Plan of Care (Signed)

## 2023-04-21 NOTE — Progress Notes (Signed)
[facility]  ROUNDING NOTE   Subjective:   Jason Hudson is a 72 y.o. male with past medical history of chronic systolic heart failure, atrial fibrillation on anticoagulation and chronic kidney disease stage IV.  Patient presents to the emergency department complaining of shortness of breath with chest pain and lower extremity edema.  Patient has been admitted for Shortness of breath [R06.02] CHF (congestive heart failure) (HCC) [I50.9] Edema, unspecified type [R60.9] Chest pain, unspecified type [R07.9]  Patient is known to our practice and is followed outpatient by Dr. Wynelle Link.  Patient was last seen in office on April 04, 2023 for routine follow-up.   Patient sitting up in chair Sister and brother in law at bedside States he feels well today Remains on 3L [place] Lower extremity edema stable NPO for vascular procedure  Creatinine 3.49 (3.38) (3.31) (3.09) (3.2) Urine output in past 24 hours  Objective:  Vital signs in last 24 hours:  Temp:  [97.7 F (36.5 C)-98.6 F (37 C)] 97.9 F (36.6 C) (08/30 0808) Pulse Rate:  [69-76] 69 (08/30 0808) Resp:  [16-20] 16 (08/30 0808) BP: (99-123)/(58-68) 118/59 (08/30 0808) SpO2:  [96 %-98 %] 97 % (08/30 0808) Weight:  [100.1 kg] 100.1 kg (08/30 0403)  Weight change:  Filed Weights   04/13/23 0153 04/15/23 0500 04/21/23 0403  Weight: (!) 145.2 kg (!) 147.8 kg 100.1 kg    Intake/Output: I/O last 3 completed shifts: In: 1694.6 [P.O.:632; I.V.:896.6; IV Piggyback:166] Out: 4875 [Urine:4875]   Intake/Output this shift:  Total I/O In: -  Out: 425 [Urine:425]  Physical Exam: General: NAD, sitting in chair  Head: Normocephalic, atraumatic. Moist oral mucosal membranes  Eyes: Anicteric  Neck: Supple  Lungs:  Clear to auscultation, [place]  Heart: Regular rate and rhythm  Abdomen:  Soft, nontender, obese  Extremities: 1+  peripheral edema.  Neurologic: Nonfocal, moving all four extremities  Skin: No  lesions, bilateral venous stasis  Access: None    Basic Metabolic Panel: Recent Labs  Lab 04/17/23 0432 04/18/23 0939 04/19/23 0445 04/20/23 0440 04/21/23 0409  NA 134* 131* 130* 130* 130*  K 4.1 3.9 3.7 3.7 3.5  CL 88* 90* 86* 87* 89*  CO2 30 30 29 28 27   GLUCOSE 102* 183* 137* 151* 139*  BUN 71* 90* 96* 92* 103*  CREATININE 3.20* 3.09* 3.31* 3.38* 3.46*  CALCIUM 10.1 9.3 9.5 9.2 8.7*  PHOS 4.4  --   --  4.7* 4.6    Liver Function Tests: Recent Labs  Lab 04/17/23 0432 04/20/23 0440 04/21/23 0409  ALBUMIN 3.4* 3.2* 3.0*  3.0*   No results for input(s): "LIPASE", "AMYLASE" in the last 168 hours.  No results for input(s): "AMMONIA" in the last 168 hours.  CBC: Recent Labs  Lab 04/15/23 0433 04/16/23 0543 04/19/23 0445 04/21/23 0409  WBC 17.6* 14.3* 17.4* 16.7*  NEUTROABS  --   --  11.6*  --   HGB 11.3* 11.9* 11.6* 11.3*  HCT 33.6* 37.2* 35.1* 34.9*  MCV 95.2 97.9 93.9 96.1  PLT 169 201 256 257    Cardiac Enzymes: Recent Labs  Lab 04/20/23 0440  CKTOTAL 181     BNP: Invalid input(s): "POCBNP"  CBG: Recent Labs  Lab 04/20/23 1101 04/20/23 1621 04/20/23 2304 04/21/23 0809 04/21/23 1139  GLUCAP 174* 104* 120* 150* 124*    Microbiology: Results for orders placed or performed during the hospital encounter of 04/12/23  SARS Coronavirus 2 by RT PCR (hospital order, performed in [facility]  hospital lab) *cepheid single result test* Anterior Nasal Swab     Status: None   Collection Time: 04/13/23 10:53 AM   Specimen: Anterior Nasal Swab  Result Value Ref Range Status   SARS Coronavirus 2 by RT PCR NEGATIVE NEGATIVE Final    Comment: (NOTE) SARS-CoV-2 target nucleic acids are NOT DETECTED.  The SARS-CoV-2 RNA is generally detectable in upper and lower respiratory specimens during the acute phase of infection. The lowest concentration of SARS-CoV-2 viral copies this assay can detect is 250 copies / mL. A negative result does not preclude  SARS-CoV-2 infection and should not be used as the sole basis for treatment or other patient management decisions.  A negative result may occur with improper specimen collection / handling, submission of specimen other than nasopharyngeal swab, presence of viral mutation(s) within the areas targeted by this assay, and inadequate number of viral copies (<250 copies / mL). A negative result must be combined with clinical observations, patient history, and epidemiological information.  Fact Sheet for Patients:   RoadLapTop.co.za  Fact Sheet for Healthcare Providers: http://kim-miller.com/  This test is not yet approved or  cleared by the [place] FDA and has been authorized for detection and/or diagnosis of SARS-CoV-2 by FDA under an Emergency Use Authorization (EUA).  This EUA will remain in effect (meaning this test can be used) for the duration of the COVID-19 declaration under Section 564(b)(1) of the Act, 21 U.S.C. section 360bbb-3(b)(1), unless the authorization is terminated or revoked sooner.  Performed at [facility], [address] [place], [place] [address]   Respiratory (~20 pathogens) panel by PCR     Status: None   Collection Time: 04/14/23  2:30 AM   Specimen: Nasopharyngeal Swab; Respiratory  Result Value Ref Range Status   Adenovirus NOT DETECTED NOT DETECTED Final   Coronavirus 229E NOT DETECTED NOT DETECTED Final    Comment: (NOTE) The Coronavirus on the Respiratory Panel, DOES NOT test for the novel  Coronavirus (2019 nCoV)    Coronavirus HKU1 NOT DETECTED NOT DETECTED Final   Coronavirus NL63 NOT DETECTED NOT DETECTED Final   Coronavirus OC43 NOT DETECTED NOT DETECTED Final   Metapneumovirus NOT DETECTED NOT DETECTED Final   Rhinovirus / Enterovirus NOT DETECTED NOT DETECTED Final   Influenza A NOT DETECTED NOT DETECTED Final   Influenza B NOT DETECTED NOT DETECTED Final   Parainfluenza Virus 1  NOT DETECTED NOT DETECTED Final   Parainfluenza Virus 2 NOT DETECTED NOT DETECTED Final   Parainfluenza Virus 3 NOT DETECTED NOT DETECTED Final   Parainfluenza Virus 4 NOT DETECTED NOT DETECTED Final   Respiratory Syncytial Virus NOT DETECTED NOT DETECTED Final   Bordetella pertussis NOT DETECTED NOT DETECTED Final   Bordetella Parapertussis NOT DETECTED NOT DETECTED Final   Chlamydophila pneumoniae NOT DETECTED NOT DETECTED Final   Mycoplasma pneumoniae NOT DETECTED NOT DETECTED Final    Comment: Performed at [facility], [address] [place], [place] [address]  MRSA Next Gen by PCR, Nasal     Status: None   Collection Time: 04/14/23  2:30 AM   Specimen: Nasal Mucosa; Nasal Swab  Result Value Ref Range Status   MRSA by PCR Next Gen NOT DETECTED NOT DETECTED Final    Comment: (NOTE) The GeneXpert MRSA Assay (FDA approved for NASAL specimens only), is one component of a comprehensive MRSA colonization surveillance program. It is not intended to diagnose MRSA infection nor to guide or monitor treatment for MRSA infections. Test performance  is not FDA approved in patients less than 91 years old. Performed at [facility], [address] [place], [place] [address]   Culture, blood (Routine X 2) w Reflex to ID Panel     Status: None   Collection Time: 04/14/23  9:25 AM   Specimen: BLOOD  Result Value Ref Range Status   Specimen Description BLOOD BLOOD RIGHT HAND  Final   Special Requests   Final    BOTTLES DRAWN AEROBIC AND ANAEROBIC Blood Culture adequate volume   Culture   Final    NO GROWTH 5 DAYS Performed at [facility], [address] [place], [place] [address]    Report Status 04/19/2023 FINAL  Final  Culture, blood (Routine X 2) w Reflex to ID Panel     Status: Abnormal   Collection Time: 04/14/23  9:25 AM   Specimen: BLOOD LEFT HAND  Result Value Ref Range Status   Specimen Description   Final    BLOOD LEFT HAND Performed at [facility], [address] [place], [place] [address]    Special Requests   Final    BOTTLES DRAWN AEROBIC AND ANAEROBIC Blood Culture adequate volume Performed at [facility], [address] [place], [place] [address]    Culture  Setup Time   Final    GRAM POSITIVE COCCI ANAEROBIC BOTTLE ONLY CRITICAL RESULT CALLED TO, READ BACK BY AND VERIFIED WITH: JASON [email or web address]  04/17/13 RH Performed at [facility], [address] [place], [place] [address]    Culture STAPHYLOCOCCUS LUGDUNENSIS (A)  Final   Report Status 04/20/2023 FINAL  Final   Organism ID, Bacteria STAPHYLOCOCCUS LUGDUNENSIS  Final      Susceptibility   Staphylococcus lugdunensis - MIC*    CIPROFLOXACIN Value in next row Sensitive      SENSITIVE<=0.5    ERYTHROMYCIN Value in next row Sensitive      SENSITIVE<=0.25    GENTAMICIN Value in next row Sensitive      SENSITIVE<=0.5    OXACILLIN Value in next row Resistant      RESISTANT>=4    TETRACYCLINE Value in next row Sensitive      SENSITIVE<=1    VANCOMYCIN Value in next row Sensitive      SENSITIVE<=0.5    TRIMETH/SULFA Value in next row Sensitive      SENSITIVE<=10    CLINDAMYCIN Value in next row Sensitive      SENSITIVE<=0.25    RIFAMPIN Value in next row Sensitive      SENSITIVE<=0.5    Inducible Clindamycin Value in next row Sensitive      SENSITIVE<=0.5    LINEZOLID Value in next row Sensitive      SENSITIVE2    * STAPHYLOCOCCUS LUGDUNENSIS  Blood Culture ID Panel (Reflexed)     Status: Abnormal   Collection Time: 04/14/23  9:25 AM  Result Value Ref Range Status   Enterococcus faecalis NOT DETECTED NOT DETECTED Final   Enterococcus Faecium NOT DETECTED NOT DETECTED Final   Listeria monocytogenes NOT DETECTED NOT DETECTED Final   Staphylococcus species DETECTED (A) NOT DETECTED Final    Comment: CRITICAL RESULT CALLED TO, READ BACK BY AND VERIFIED WITH: JASON [email or web address]  04/18/23 RH    Staphylococcus aureus (BCID) NOT DETECTED NOT  DETECTED Final   Staphylococcus epidermidis NOT DETECTED NOT DETECTED Final   Staphylococcus lugdunensis DETECTED (A) NOT DETECTED Final    Comment: CRITICAL RESULT CALLED TO, READ BACK BY AND VERIFIED WITH: JASON [email or web address]   04/18/23 RH    Streptococcus species NOT DETECTED NOT DETECTED Final   Streptococcus agalactiae NOT DETECTED NOT DETECTED Final   Streptococcus pneumoniae NOT DETECTED NOT DETECTED Final   Streptococcus pyogenes NOT DETECTED NOT DETECTED Final   A.calcoaceticus-baumannii NOT DETECTED NOT DETECTED Final   Bacteroides fragilis NOT DETECTED NOT DETECTED Final   Enterobacterales NOT DETECTED NOT DETECTED Final   Enterobacter cloacae complex NOT DETECTED NOT DETECTED Final   Escherichia coli NOT DETECTED NOT DETECTED Final   Klebsiella aerogenes NOT DETECTED NOT DETECTED Final   Klebsiella oxytoca NOT DETECTED NOT DETECTED Final   Klebsiella pneumoniae NOT DETECTED NOT DETECTED Final   Proteus species NOT DETECTED NOT DETECTED Final   Salmonella species NOT DETECTED NOT DETECTED Final   Serratia marcescens NOT DETECTED NOT DETECTED Final   Haemophilus influenzae NOT DETECTED NOT DETECTED Final   Neisseria meningitidis NOT DETECTED NOT DETECTED Final   Pseudomonas aeruginosa NOT DETECTED NOT DETECTED Final   Stenotrophomonas maltophilia NOT DETECTED NOT DETECTED Final   Candida albicans NOT DETECTED NOT DETECTED Final   Candida auris NOT DETECTED NOT DETECTED Final   Candida glabrata NOT DETECTED NOT DETECTED Final   Candida krusei NOT DETECTED NOT DETECTED Final   Candida parapsilosis NOT DETECTED NOT DETECTED Final   Candida tropicalis NOT DETECTED NOT DETECTED Final   Cryptococcus neoformans/gattii NOT DETECTED NOT DETECTED Final   Methicillin resistance mecA/C NOT DETECTED NOT DETECTED Final    Comment: Performed at [facility], [address] [place], [place] [address]  Culture, blood (Routine X 2) w Reflex to ID Panel     Status: None (Preliminary  result)   Collection Time: 04/18/23 10:47 AM   Specimen: BLOOD  Result Value Ref Range Status   Specimen Description BLOOD BLOOD RIGHT HAND  Final   Special Requests   Final    BOTTLES DRAWN AEROBIC AND ANAEROBIC Blood Culture results may not be optimal due to an excessive volume of blood received in culture bottles   Culture   Final    NO GROWTH 3 DAYS Performed at [facility], [address] [place], [place] [address]    Report Status PENDING  Incomplete  Culture, blood (Routine X 2) w Reflex to ID Panel     Status: None (Preliminary result)   Collection Time: 04/18/23 10:55 AM   Specimen: BLOOD  Result Value Ref Range Status   Specimen Description BLOOD BLOOD LEFT HAND  Final   Special Requests   Final    BOTTLES DRAWN AEROBIC AND ANAEROBIC Blood Culture results may not be optimal due to an excessive volume of blood received in culture bottles   Culture   Final    NO GROWTH 3 DAYS Performed at [facility], [address] [place], [place] [address]    Report Status PENDING  Incomplete    Coagulation Studies: No results for input(s): "LABPROT", "INR" in the last 72 hours.   Urinalysis: No results for input(s): "COLORURINE", "LABSPEC", "PHURINE", "GLUCOSEU", "HGBUR", "BILIRUBINUR", "KETONESUR", "PROTEINUR", "UROBILINOGEN", "NITRITE", "LEUKOCYTESUR" in the last 72 hours.  Invalid input(s): "APPERANCEUR"    Imaging: [place] EKG SITE RITE  Result Date: 04/20/2023 If Site Rite image not attached, placement could not be confirmed due to current cardiac rhythm.    Medications:    sodium chloride     sodium chloride Stopped (04/21/23 9147)    ceFAZolin (ANCEF) IV     cefTRIAXone (ROCEPHIN)  IV Stopped (04/20/23 2341)   DAPTOmycin (CUBICIN) 800 mg in  sodium chloride 0.9 % IVPB Stopped (04/20/23 1611)    allopurinol  100 mg Oral Daily   amiodarone  200 mg Oral Daily   aspirin EC  81 mg Oral Daily   carvedilol  6.25 mg Oral BID WC   doxazosin  4 mg Oral  QHS   enoxaparin (LOVENOX) injection  30 mg Subcutaneous Q24H   fluticasone  2 spray Each Nare BID   fluticasone furoate-vilanterol  1 puff Inhalation Daily   insulin aspart  0-20 Units Subcutaneous TID WC   insulin aspart  0-5 Units Subcutaneous QHS   insulin aspart  5 Units Subcutaneous TID WC   insulin glargine-yfgn  30 Units Subcutaneous BID   isosorbide mononitrate  30 mg Oral Daily   levothyroxine  112 mcg Oral Q0600   lidocaine  1 patch Transdermal Q24H   linagliptin  5 mg Oral Daily   living well with diabetes book   Does not apply Once   loratadine  10 mg Oral Daily   sodium chloride flush  3 mL Intravenous Q12H   [START ON 04/22/2023] spironolactone  12.5 mg Oral Daily   torsemide  40 mg Oral Daily   sodium chloride, acetaminophen, albuterol, cyclobenzaprine, diphenhydrAMINE, docusate sodium, famotidine, fentaNYL (SUBLIMAZE) injection, HYDROmorphone (DILAUDID) injection, ipratropium, LORazepam, magnesium hydroxide, methylPREDNISolone (SOLU-MEDROL) injection, midazolam, ondansetron (ZOFRAN) IV, ondansetron (ZOFRAN) IV, oxyCODONE, sodium chloride flush, traZODone  Assessment/ Plan:  Mr. Jason Hudson is a 71 y.o.  male with past medical history of chronic systolic heart failure, atrial fibrillation on anticoagulation and chronic kidney disease stage IV.  Patient presents to the emergency department complaining of shortness of breath with chest pain and lower extremity edema.  Patient has been admitted for Shortness of breath [R06.02] CHF (congestive heart failure) (HCC) [I50.9] Edema, unspecified type [R60.9] Chest pain, unspecified type [R07.9]   Acute Kidney Injury on chronic kidney disease stage IV with baseline creatinine 2.5 and GFR of 27 on 03/20/23.  Acute kidney injury secondary to fluid overload and aggressive diuresis Chronic kidney disease is secondary to poor diabetic control Awaiting renal ultrasound.  No IV contrast exposure.    Creatinine continues to  slowly rise. Will decrease Spironolactone to 12.5mg  diaily  Lab Results  Component Value Date   CREATININE 3.46 (H) 04/21/2023   CREATININE 3.38 (H) 04/20/2023   CREATININE 3.31 (H) 04/19/2023    Intake/Output Summary (Last 24 hours) at 04/21/2023 1251 Last data filed at 04/21/2023 0853 Gross per 24 hour  Intake 1214.55 ml  Output 2250 ml  Net -1035.45 ml    2.  Acute on chronic diastolic heart failure.  Echo completed this admission shows EF 55 to 60%. Continue dialy Torsemide 40mg .   3. Anemia of chronic kidney disease Lab Results  Component Value Date   HGB 11.3 (L) 04/21/2023    Hemoglobin remains at goal  4. 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 Home regimen includes [place]. Most recent hemoglobin A1c is 11.3 on 04/13/23.   5. Hyponatremia likely secondary to volume status. Placed on fluid restriction and continue diuresis. Remains 130  6. Bacteremia, staphylococcus lugdunensis. MRI shows osteomyelitis at T5-T6. Receiving cefazolin for positive blood cultures. Vascular to place [facility] for antibiotic therapy.    LOS: [address] 8/30/202412:51 PM

## 2023-04-21 NOTE — Plan of Care (Signed)
  Problem: Coping: Goal: Ability to adjust to condition or change in health will improve Outcome: Progressing   Problem: Fluid Volume: Goal: Ability to maintain a balanced intake and output will improve Outcome: Progressing   Problem: Health Behavior/Discharge Planning: Goal: Ability to manage health-related needs will improve Outcome: Progressing   Problem: Metabolic: Goal: Ability to maintain appropriate glucose levels will improve Outcome: Progressing   

## 2023-04-21 NOTE — Discharge Summary (Addendum)
Discharge Summary  Jason Hudson WUX:[id number] DOB: 04/15/1951  PCP: Enid Baas, MD  Admit date: 04/12/2023 Discharge date: 04/21/2023  Time spent: 35 minutes.  Recommendations for Outpatient Follow-up:  Follow-up with infectious disease in 1 to 2 weeks. Follow-up with nephrology in 1 to 2 weeks. Follow-up with vascular surgery. Follow-up with your cardiologist in 1 to 2 weeks. Follow-up with your primary care provider in 1 to 2 weeks. Take your medications as prescribed. Continue fall precautions.  Discharge Diagnoses:  Active Hospital Problems   Diagnosis Date Noted   Sepsis due to pneumonia (HCC) 04/13/2023    Priority: 1.   Acute on chronic systolic congestive heart failure (HCC) 09/06/2018    Priority: 2.   Leukocytosis 04/13/2023    Priority: 3.   Chest pain 04/13/2023    Priority: 4.   Essential hypertension 01/19/2018    Priority: 5.   Paroxysmal atrial fibrillation (HCC) 04/13/2023    Priority: 6.   Chronic kidney disease, stage 4 (severe) (HCC) 10/28/2020    Priority: 7.   Chronic obstructive pulmonary disease (HCC) 12/15/2017    Priority: 8.   Type 2 diabetes mellitus with chronic kidney disease, with long-term current use of insulin (HCC) 03/28/2018    Priority: 9.   Obesity, Class III, BMI 40-49.9 (morbid obesity) (HCC) 04/13/2023    Priority: 10.   Obstructive sleep apnea of adult 12/15/2017    Priority: 11.   Vertebral osteomyelitis (HCC) 04/20/2023   Coag negative Staphylococcus bacteremia 04/19/2023   Discitis thoracic region 04/19/2023    Resolved Hospital Problems  No resolved problems to display.    Discharge Condition: Stable.  Diet recommendation: Heart healthy, carb modified diet.  Vitals:   04/21/23 0510 04/21/23 0808  BP: (!) 99/58 (!) 118/59  Pulse: 69 69  Resp: 20 16  Temp: 97.7 F (36.5 C) 97.9 F (36.6 C)  SpO2: 98% 97%    History of present illness:  Jason Hudson is a 72 y.o. male with medical  history significant of chronic HFrEF with LVEF 40-45%, Paroxysmal AF not on anticoagulation due to history of severe GI bleed, CKD stage IV followed by nephrology, COPD on 2 to 3 L nasal cannula as needed, OSA on CPAP nightly, IDDM with insulin resistance, gout, hypothyroidism, BPH, morbid obesity, presented with increasing shortness of breath, leg swelling and chest pain.  Workup revealed sepsis secondary to pneumonia, Staphylococcus lugdunensis bacteremia, T5-T6 discitis and osteomyelitis, acute on chronic systolic CHF.  The patient was seen by infectious disease during his hospitalization.  The patient was also seen by cardiology, nephrology, and vascular surgery.  The patient will require 6 weeks of IV antibiotics for his bacteremia, discitis/osteomyelitis.  Seen by vascular surgery for placement of tunneled catheter for long-term antibiotics.  04/21/2023: The patient was seen and examined at his bedside.  There were no acute events overnight.  He has no new complaints.  He will be discharged to SNF today.   Assessment and Plan: * Sepsis due to pneumonia (HCC) Staphylococcus lugdunensis bacteremia. T5-T6 discitis and osteomyelitis, POA. Antibiotics recommendations per infectious disease: OPAT Orders Discharge antibiotics: Daptomycin 800mg  IV every 48 hrs  Acute on chronic systolic congestive heart failure (HCC) Euvolemic Resume home regimen   Essential hypertension Vital signs are stable Resume home regimen   Paroxysmal atrial fibrillation (HCC) Rate is controlled   Chronic kidney disease, stage 4 (severe) (HCC) At baseline   Chronic obstructive pulmonary disease (HCC) Stable Resume home regimen   Type 2 diabetes mellitus with  chronic kidney disease, with long-term current use of insulin (HCC) A1C 11.3   Obesity, Class III, BMI 40-49.9 (morbid obesity) (HCC) Recommended weight loss outpatient   Obstructive sleep apnea of adult Recommend CPAP   OPAT Orders Discharge  antibiotics: Daptomycin 800mg  IV every 48 hrs   And Ceftriaxone 2 grams IV ever 24 hours Duration: 6 weeks End Date: 05/30/23   [facility] Per Protocol:( has tunneled cath)   Labs weekly while on IV antibiotics: _X_ CBC with differential _X_ CK _X_ CMP _X_ CRP _X_ ESR   Monitor for rhabomyolysis or eosinophilia   ___ Please leave central line  place until doctor ( vascular surgeon) has seen patient and will remove the cath   Fax weekly lab results  promptly to [phone]   Clinic Follow Up Appt:with Dr.Ravishankar  My chart vidoe- 05/04/23 at 9.15 am   Call [phone] with any critical value or questions   Hospital Course:  Principal Problem:   Sepsis due to pneumonia [facility]) Active Problems:   Acute on chronic systolic congestive heart failure (HCC)   Leukocytosis   Chest pain   Essential hypertension   Paroxysmal atrial fibrillation (HCC)   Chronic kidney disease, stage 4 (severe) (HCC)   Chronic obstructive pulmonary disease (HCC)   Type 2 diabetes mellitus with chronic kidney disease, with long-term current use of insulin (HCC)   Obesity, Class III, BMI 40-49.9 (morbid obesity) (HCC)   Obstructive sleep apnea of adult   Coag negative Staphylococcus bacteremia   Discitis thoracic region   Vertebral osteomyelitis (HCC)    Discharge Exam: BP (!) 118/59 (BP Location: Right Arm)   Pulse 69   Temp 97.9 F (36.6 C) (Oral)   Resp 16   Ht 5\' 8"  (1.727 m)   Wt 100.1 kg   SpO2 97%   BMI 33.55 kg/m  General: 72 y.o. year-old male obese in no acute distress.  Alert and oriented x3. Cardiovascular: Regular rate and rhythm with no rubs or gallops.  No thyromegaly or JVD noted.   Respiratory: Clear to auscultation with no wheezes or rales. Good inspiratory effort. Abdomen: Soft nontender nondistended with normal bowel sounds x4 quadrants. Skin: Lower extremity edema bilaterally with chronic venous stasis. Psychiatry: Mood is appropriate for condition and  setting  Discharge Instructions You were cared for by a hospitalist during your hospital stay. If you have any questions about your discharge medications or the care you received while you were in the hospital after you are discharged, you can call the unit and asked to speak with the hospitalist on call if the hospitalist that took care of you is not available. Once you are discharged, your primary care physician will handle any further medical issues. Please note that NO REFILLS for any discharge medications will be authorized once you are discharged, as it is imperative that you return to your primary care physician (or establish a relationship with a primary care physician if you do not have one) for your aftercare needs so that they can reassess your need for medications and monitor your lab values.  Discharge Instructions     [facility] pharmacist to adjust dose for Vancomycin, Aminoglycosides and other anti-infective therapies as requested by physician.   Complete by: As directed    [facility] to provide Cath Flo 2mg    Complete by: As directed    Administer for PICC line occlusion and as ordered by physician for other access device issues.   Anaphylaxis Kit: Provided to  treat any anaphylactic reaction to the medication being provided to the patient if First Dose or when requested by physician   Complete by: As directed    Epinephrine 1mg /ml vial / amp: Administer 0.3mg  (0.9ml) subcutaneously once for moderate to severe anaphylaxis, nurse to call physician and pharmacy when reaction occurs and call 911 if needed for immediate care   Diphenhydramine 50mg /ml IV vial: Administer 25-50mg  IV/IM PRN for first dose reaction, rash, itching, mild reaction, nurse to call physician and pharmacy when reaction occurs   Sodium Chloride 0.9% NS IV: Administer if needed for hypovolemic blood pressure drop or as ordered by physician after call to physician with anaphylactic  reaction   Change dressing on IV access line weekly and PRN   Complete by: As directed    Flush IV access with Sodium Chloride 0.9% and Heparin 10 units/ml or 100 units/ml   Complete by: As directed    Home infusion instructions - [facility]   Complete by: As directed    Instructions: Flush IV access with Sodium Chloride 0.9% and Heparin 10units/ml or 100units/ml   Change dressing on IV access line: Weekly and PRN   Instructions Cath Flo 2mg : Administer for PICC Line occlusion and as ordered by physician for other access device   [facility] pharmacist to adjust dose for: Vancomycin, Aminoglycosides and other anti-infective therapies as requested by physician   Method of administration may be changed at the discretion of home infusion pharmacist based upon assessment of the patient and/or caregiver's ability to self-administer the medication ordered   Complete by: As directed       Allergies as of 04/21/2023       Reactions   Atorvastatin Other (See Comments)   Other reaction(s): Not available Other reaction(s): Other (See Comments)   Red Dye #40 (allura Red) Other (See Comments)   "not as allergic to it as I used to be. I can take a little of it"        Medication List     STOP taking these medications    colchicine 0.6 MG tablet   fluticasone 50 MCG/ACT nasal spray Commonly known as: FLONASE   glipiZIDE 5 MG 24 hr tablet Commonly known as: GLUCOTROL XL   insulin degludec 200 UNIT/ML FlexTouch Pen Commonly known as: TRESIBA   metolazone 5 MG tablet Commonly known as: ZAROXOLYN   predniSONE 20 MG tablet Commonly known as: DELTASONE   traZODone 50 MG tablet Commonly known as: DESYREL   Tresiba FlexTouch 100 UNIT/ML FlexTouch Pen Generic drug: insulin degludec       TAKE these medications    albuterol 108 (90 Base) MCG/ACT inhaler Commonly known as: VENTOLIN HFA Inhale 1-2 puffs into the lungs every 6 (six) hours as needed for  wheezing or shortness of breath. What changed: Another medication with the same name was removed. Continue taking this medication, and follow the directions you see here.   allopurinol 100 MG tablet Commonly known as: ZYLOPRIM TAKE ONE TABLET (100 MG) BY MOUTH ONCE DAILY   amiodarone 200 MG tablet Commonly known as: PACERONE Take 1 tablet (200 mg total) by mouth daily.   aspirin EC 81 MG tablet Take 81 mg by mouth daily. Swallow whole.   budesonide-formoterol 160-4.5 MCG/ACT inhaler Commonly known as: SYMBICORT Inhale 2 puffs into the lungs 2 (two) times daily.   calcitRIOL 0.25 MCG capsule Commonly known as: ROCALTROL Take 0.25 mcg by mouth daily.   carvedilol 6.25 MG tablet Commonly known  as: COREG Take 6.25 mg by mouth 2 (two) times daily with a meal. Pt gets from cardiologist   cefTRIAXone IVPB Commonly known as: ROCEPHIN Inject 2 g into the vein daily. Indication:  Vertebral osteomyelitis First Dose: Yes Last Day of Therapy:  05/30/23 Labs - Once weekly: CBC with differential, CK, CMP, CRP, ESR Monitor for rhabomyolysis or eosinophilia Fax weekly lab results  promptly to [phone]  Please leave central line  place until doctor ( vascular surgeon) has seen patient and will remove the cath Method of administration: IV Push Method of administration may be changed at the discretion of home infusion pharmacist based upon assessment of the patient and/or caregiver's ability to self-administer the medication ordered.   daptomycin IVPB Commonly known as: CUBICIN Inject 800 mg into the vein every other day. Indication:  Vertebral osteomyelitis First Dose: Yes Last Day of Therapy:  05/30/23 Labs - Once weekly:  CBC with differential, CK, CMP, CRP, ESR Monitor for rhabomyolysis or eosinophilia Fax weekly lab results  promptly to [phone]  Please leave central line  place until doctor ( vascular surgeon) has seen patient and will remove the cath Method of  administration: IV Push Method of administration may be changed at the discretion of home infusion pharmacist based upon assessment of the patient and/or caregiver's ability to self-administer the medication ordered.   diphenhydrAMINE 25 mg capsule Commonly known as: BENADRYL Take 25 mg by mouth every 6 (six) hours as needed for itching or allergies.   doxazosin 4 MG tablet Commonly known as: CARDURA TAKE 1 TABLET BY MOUTH AT BEDTIME   insulin aspart 100 UNIT/ML FlexPen Commonly known as: NOVOLOG Inject 5 Units into the skin 3 (three) times daily with meals.   insulin glargine 100 UNIT/ML Solostar Pen Commonly known as: LANTUS Inject 30 Units into the skin 2 (two) times daily.   ipratropium 0.02 % nebulizer solution Commonly known as: ATROVENT Take 2.5 mLs (0.5 mg total) by nebulization every 6 (six) hours as needed for wheezing or shortness of breath.   isosorbide mononitrate 30 MG 24 hr tablet Commonly known as: IMDUR Take 30 mg by mouth daily.   levothyroxine 112 MCG tablet Commonly known as: SYNTHROID Take 112 mcg by mouth daily before breakfast. What changed: Another medication with the same name was removed. Continue taking this medication, and follow the directions you see here.   lidocaine 5 % Commonly known as: LIDODERM Place 1 patch onto the skin daily. Remove & Discard patch within 12 hours or as directed by MD   linagliptin 5 MG Tabs tablet Commonly known as: TRADJENTA Take 1 tablet (5 mg total) by mouth daily.   loratadine 10 MG tablet Commonly known as: CLARITIN Take 10 mg by mouth daily.   multivitamin Tabs tablet Take 1 tablet by mouth daily.   nitroGLYCERIN 0.4 MG SL tablet Commonly known as: NITROSTAT Place 1 tablet (0.4 mg total) under the tongue every 5 (five) minutes as needed for chest pain.   Oxycodone HCl 10 MG Tabs Take 1 tablet (10 mg total) by mouth every 6 (six) hours as needed for up to 5 days for breakthrough pain or severe pain. What  changed:  medication strength how much to take how to take this when to take this reasons to take this additional instructions   pantoprazole 40 MG tablet Commonly known as: PROTONIX Take 40 mg by mouth daily.   potassium chloride 10 MEQ tablet Commonly known as: KLOR-CON TAKE TWO TABLETS EVERY OTHER  DAY AND 1 TABLET ON THE DAYS IN BETWEEN What changed: See the new instructions.   spironolactone 25 MG tablet Commonly known as: ALDACTONE Take 0.5 tablets (12.5 mg total) by mouth daily. Start taking on: April 22, 2023   torsemide 20 MG tablet Commonly known as: DEMADEX Take 2 tablets (40 mg total) by mouth daily. What changed: how much to take   Vitamin D3 50 MCG (2000 UT) Tabs Take 2,000 Units by mouth at bedtime.               Discharge Care Instructions  (From admission, onward)           Start     Ordered   04/21/23 0000  Change dressing on IV access line weekly and PRN  (Home infusion instructions - [facility] )        04/21/23 0754           Allergies  Allergen Reactions   Atorvastatin Other (See Comments)    Other reaction(s): Not available Other reaction(s): Other (See Comments)    Red Dye #40 (Allura Red) Other (See Comments)    "not as allergic to it as I used to be. I can take a little of it"    Follow-up Information     Enid Baas, MD. Call today.   Specialty: Internal Medicine Why: Please call for a posthospital follow-up appointment. Contact information: [address] [place] [address] [phone]         Lynn Ito, MD. Call today.   Specialty: Infectious Diseases Why: Please call for a posthospital follow up appointment. Contact information: [address] [place] [address] [phone]         Gilda Crease, Latina Craver, MD. Call today.   Specialties: Vascular Surgery, Cardiology, Radiology, Vascular Surgery Why: Please call for a posthospital follow-up  appointment. Contact information: [address] [place] [address] [phone]         Lamont Dowdy, MD. Call today.   Specialty: Nephrology Why: Please call for a posthospital follow-up appointment. Contact information: [address] [place] [address] [phone]                  The results of significant diagnostics from this hospitalization (including imaging, microbiology, ancillary and laboratory) are listed below for reference.    Significant Diagnostic Studies: [place] EKG SITE RITE  Result Date: 04/20/2023 If Site Rite image not attached, placement could not be confirmed due to current cardiac rhythm.  MR THORACIC SPINE WO CONTRAST  Result Date: 04/19/2023 CLINICAL DATA:  72 year old male with bacteremia and persistent back pain, mid back pain. EXAM: MRI THORACIC SPINE WITHOUT CONTRAST TECHNIQUE: Multiplanar, multisequence MR imaging of the thoracic spine was performed. No intravenous contrast was administered. COMPARISON:  Chest CT 04/14/2023. FINDINGS: Limited cervical spine imaging: Straightening of cervical lordosis but overall the cervical spine degeneration appears to be mild for age. Thoracic spine segmentation:  Normal on the recent CT. Alignment: Stable with some straightening of thoracic kyphosis. No significant scoliosis or spondylolisthesis. Vertebrae: Abnormal T5 and T6 vertebral bodies with evidence of marrow edema and adjacent mild paraspinal soft tissue inflammation (series 21 images 5 through 16. Disc space loss there, but no vacuum disc on the recent CT. The disc space appears indistinct on T1 imaging. And there appear to be subtle endplate erosions adjacent to the disc. Bilateral facet joint fluid at that level, and also some increased STIR signal in the  interspinous ligament there. Furthermore, abnormal epidural space at both of those levels more pronounced at T6 ventrally (series 23, image 19). Overall mild  spinal and moderate bilateral T5, right T6 neural foraminal stenosis which is multifactorial. No convincing spinal cord mass effect. Background bone marrow signal within normal limits. Chronic degenerative endplate changes elsewhere but no other marrow edema or evidence of acute osseous abnormality. Cord: No spinal cord signal abnormality. Conus medullaris partially visible and appears to remain normal. Paraspinal and other soft tissues: Abnormal at T5-T6 as detailed above. But no discrete paraspinal fluid collection. Incidental trace layering pleural effusions. Incidental dermal cyst of the right upper back on series 21, image 4. Disc levels: In addition to the abnormal T5-T6 level described above there is widespread thoracic spine degeneration, including bulky chronic calcified disc or disc osteophyte complex at T1-T2 which effaces the ventral CSF there, although despite its size there is not high-grade spinal stenosis there. IMPRESSION: 1. Positive for strong evidence of T5-T6 Discitis Osteomyelitis on this noncontrast exam. Associated ventral epidural and also paraspinal phlegmon. No drainable fluid collection suspected. Mild subsequent spinal stenosis at both T5 and T6, and moderate multifactorial neural foraminal stenosis. No thoracic spinal cord compression or cord signal abnormality. 2. Elsewhere chronic thoracic spine degeneration. 3. Trace layering pleural effusions. Electronically Signed   By: Odessa Fleming M.D.   On: 04/19/2023 11:28   US RENAL  Result Date: 04/14/2023 CLINICAL DATA:  Acute renal injury EXAM: RENAL / URINARY TRACT ULTRASOUND COMPLETE COMPARISON:  06/25/2019 FINDINGS: Right Kidney: Renal measurements: 10.9 x 5.6 x 5.4 cm. = volume: 171 mL. Echogenicity within normal limits. No mass or hydronephrosis visualized. Left Kidney: Renal measurements: 11.1 x 6.7 x 5.8 cm. = volume: 210 mL. Echogenicity within normal limits. No mass or hydronephrosis visualized. Bladder: Appears normal for degree of  bladder distention. Other: None. IMPRESSION: No acute abnormality noted. Electronically Signed   By: Alcide Clever M.D.   On: 04/14/2023 19:04   NM Pulmonary Perfusion  Result Date: 04/14/2023 CLINICAL DATA:  Chronic shortness of breath, negative D-dimer EXAM: NUCLEAR MEDICINE PERFUSION LUNG SCAN TECHNIQUE: Perfusion images were obtained in multiple projections after intravenous injection of radiopharmaceutical. Ventilation scans intentionally deferred if perfusion scan and chest x-ray adequate for interpretation during COVID 19 epidemic. RADIOPHARMACEUTICALS:  4.11 mCi Tc-68m MAA IV COMPARISON:  Chest x-ray from earlier in the same day, prior perfusion examination from 07/04/2022 FINDINGS: Uptake is noted throughout both lungs. There is a relative paucity of uptake in the bases bilaterally related to the known cardiomegaly and underlying elevated right hemidiaphragm. No focal perfusion defect to suggest acute pulmonary embolism is noted. The overall appearance is stable from the prior exam. IMPRESSION: No evidence of pulmonary embolism. Electronically Signed   By: Alcide Clever M.D.   On: 04/14/2023 19:03   DG Chest 2 View  Result Date: 04/14/2023 CLINICAL DATA:  Shortness of breath EXAM: CHEST - 2 VIEW COMPARISON:  04/12/2023.  CT today. FINDINGS: Cardiomegaly. Low lung volumes with bibasilar atelectasis. No overt edema or effusions. No acute bony abnormality. IMPRESSION: Cardiomegaly. Low volumes with bibasilar atelectasis. Electronically Signed   By: Charlett Nose M.D.   On: 04/14/2023 17:42   CT CHEST WO CONTRAST  Result Date: 04/14/2023 CLINICAL DATA:  Chronic shortness of breath EXAM: CT CHEST WITHOUT CONTRAST TECHNIQUE: Multidetector CT imaging of the chest was performed following the standard protocol without IV contrast. RADIATION DOSE REDUCTION: This exam was performed according to the departmental dose-optimization program which includes automated  exposure control, adjustment of the mA and/or kV  according to patient size and/or use of iterative reconstruction technique. COMPARISON:  02/06/2018 FINDINGS: Cardiovascular: Heart is mildly enlarged. Scattered coronary artery and aortic calcifications. No aneurysm. Mediastinum/Nodes: No mediastinal, hilar, or axillary adenopathy. Trachea and esophagus are unremarkable. Diffuse thyroid enlargement. Lungs/Pleura: Bibasilar atelectasis.  No effusions. Upper Abdomen: No acute findings Musculoskeletal: Chest wall soft tissues are unremarkable. No acute bony abnormality. IMPRESSION: Cardiomegaly, coronary artery disease. Bibasilar atelectasis. No acute cardiopulmonary disease. Diffuse thyroid enlargement. Recommend non emergent thyroid ultrasound (ref: J Am Coll Radiol. 2015 Feb;12(2): 143-50). Aortic Atherosclerosis (ICD10-I70.0). Electronically Signed   By: Charlett Nose M.D.   On: 04/14/2023 17:42   ECHOCARDIOGRAM COMPLETE  Result Date: 04/14/2023    ECHOCARDIOGRAM REPORT   Patient Name:   IZEYAH PEAL [facility] Date of Exam: 04/14/2023 Medical Rec #:  [id number]              Height:       68.0 in Accession #:    [id number]             Weight:       320.0 lb Date of Birth:  March 30, 1951              BSA:          2.496 m Patient Age:    72 years               BP:           125/65 mmHg Patient Gender: M                      HR:           86 bpm. Exam Location:  [facility] Procedure: 2D Echo, Cardiac Doppler and Color Doppler Indications:     CHF--acute systolic I50.21  History:         Patient has prior history of Echocardiogram examinations, most                  recent 10/22/2017. CHF, COPD; Risk Factors:Diabetes and                  Hypertension.  Sonographer:     Cristela Blue Referring Phys:  [id number] Emeline General Diagnosing Phys: Clotilde Dieter  Sonographer Comments: Technically challenging study due to limited acoustic windows, no apical window and no subcostal window. Image acquisition challenging due to patient body habitus. IMPRESSIONS  1. Left ventricular ejection  fraction, by estimation, is 55 to 60%. Left ventricular ejection fraction by PLAX is 63 %. The left ventricle has normal function. The left ventricle has no regional wall motion abnormalities. Left ventricular diastolic function could not be evaluated.  2. Right ventricular systolic function is normal. The right ventricular size is normal.  3. The mitral valve is grossly normal. No evidence of mitral valve regurgitation.  4. The aortic valve is grossly normal. Aortic valve regurgitation is not visualized. FINDINGS  Left Ventricle: Left ventricular ejection fraction, by estimation, is 55 to 60%. Left ventricular ejection fraction by PLAX is 63 %. The left ventricle has normal function. The left ventricle has no regional wall motion abnormalities. The left ventricular internal cavity size was normal in size. There is no left ventricular hypertrophy. Left ventricular diastolic function could not be evaluated. Right Ventricle: The right ventricular size is normal. No increase in right ventricular wall thickness. Right ventricular systolic function is normal. Left Atrium: Left atrial size was  normal in size. Right Atrium: Right atrial size was normal in size. Pericardium: There is no evidence of pericardial effusion. Mitral Valve: The mitral valve is grossly normal. No evidence of mitral valve regurgitation. Tricuspid Valve: The tricuspid valve is grossly normal. Tricuspid valve regurgitation is not demonstrated. Aortic Valve: The aortic valve is grossly normal. Aortic valve regurgitation is not visualized. Pulmonic Valve: The pulmonic valve was grossly normal. Pulmonic valve regurgitation is not visualized. Aorta: The aortic root is normal in size and structure. IAS/Shunts: The interatrial septum was not assessed.  LEFT VENTRICLE PLAX 2D LV EF:         Left ventricular ejection fraction by PLAX is 63 %. LVIDd:         3.60 cm LVIDs:         2.40 cm LV PW:         1.40 cm LV IVS:        1.70 cm LVOT diam:     2.30 cm  LVOT Area:     4.15 cm  LEFT ATRIUM         Index LA diam:    3.00 cm 1.20 cm/m   AORTA Ao Root diam: 3.50 cm  SHUNTS Systemic Diam: 2.30 cm Rozell Searing Custovic Electronically signed by Clotilde Dieter Signature Date/Time: 04/14/2023/9:18:37 AM    Final    US Venous Img Lower Bilateral (DVT)  Result Date: 04/13/2023 CLINICAL DATA:  Bilateral lower extremity pain and swelling. 3-4 days EXAM: Bilateral LOWER EXTREMITY VENOUS DOPPLER ULTRASOUND TECHNIQUE: Gray-scale sonography with compression, as well as color and duplex ultrasound, were performed to evaluate the deep venous system(s) from the level of the common femoral vein through the popliteal and proximal calf veins. COMPARISON:  None Available. FINDINGS: VENOUS Normal compressibility of the common femoral, superficial femoral, greater saphenous vein and popliteal veins, as well as the visualized calf veins. Patient had difficulty tolerating the procedure as per the sonographer. There is poor visualization of the profunda femoral veins bilaterally and some of the calf veins. No filling defects to suggest DVT on grayscale or color Doppler imaging. Doppler waveforms show normal direction of venous flow, normal respiratory plasticity and response to augmentation. Limited views of the contralateral common femoral vein are unremarkable. OTHER None. Limitations: Difficulty tolerating the procedure as per the sonographer. Limited visualization as well with overlapping soft tissue IMPRESSION: Limited examination as above. No evidence of DVT bilaterally of the lower extremities above the knee of the visualized vessels. Electronically Signed   By: Karen Kays M.D.   On: 04/13/2023 15:18   DG Chest 2 View  Result Date: 04/12/2023 CLINICAL DATA:  Chest pain EXAM: CHEST - 2 VIEW COMPARISON:  X-ray 01/03/2023 FINDINGS: Under penetrated radiographs enlarged cardiopericardial silhouette. No pneumothorax or edema. No definite pleural effusion. Eventration of the right  hemidiaphragm with some adjacent atelectasis or scar. IMPRESSION: Eventration of the right hemidiaphragm with some adjacent atelectasis. Enlarged cardiopericardial silhouette. Electronically Signed   By: Karen Kays M.D.   On: 04/12/2023 14:15    Microbiology: Recent Results (from the past 240 hour(s))  SARS Coronavirus 2 by RT PCR (hospital order, performed in [facility] hospital lab) *cepheid single result test* Anterior Nasal Swab     Status: None   Collection Time: 04/13/23 10:53 AM   Specimen: Anterior Nasal Swab  Result Value Ref Range Status   SARS Coronavirus 2 by RT PCR NEGATIVE NEGATIVE Final    Comment: (NOTE) SARS-CoV-2 target nucleic acids are NOT DETECTED.  The  SARS-CoV-2 RNA is generally detectable in upper and lower respiratory specimens during the acute phase of infection. The lowest concentration of SARS-CoV-2 viral copies this assay can detect is 250 copies / mL. A negative result does not preclude SARS-CoV-2 infection and should not be used as the sole basis for treatment or other patient management decisions.  A negative result may occur with improper specimen collection / handling, submission of specimen other than nasopharyngeal swab, presence of viral mutation(s) within the areas targeted by this assay, and inadequate number of viral copies (<250 copies / mL). A negative result must be combined with clinical observations, patient history, and epidemiological information.  Fact Sheet for Patients:   RoadLapTop.co.za  Fact Sheet for Healthcare Providers: http://kim-miller.com/  This test is not yet approved or  cleared by the [place] FDA and has been authorized for detection and/or diagnosis of SARS-CoV-2 by FDA under an Emergency Use Authorization (EUA).  This EUA will remain in effect (meaning this test can be used) for the duration of the COVID-19 declaration under Section 564(b)(1) of the Act, 21  U.S.C. section 360bbb-3(b)(1), unless the authorization is terminated or revoked sooner.  Performed at [facility], [address] [place], [place] [address]   Respiratory (~20 pathogens) panel by PCR     Status: None   Collection Time: 04/14/23  2:30 AM   Specimen: Nasopharyngeal Swab; Respiratory  Result Value Ref Range Status   Adenovirus NOT DETECTED NOT DETECTED Final   Coronavirus 229E NOT DETECTED NOT DETECTED Final    Comment: (NOTE) The Coronavirus on the Respiratory Panel, DOES NOT test for the novel  Coronavirus (2019 nCoV)    Coronavirus HKU1 NOT DETECTED NOT DETECTED Final   Coronavirus NL63 NOT DETECTED NOT DETECTED Final   Coronavirus OC43 NOT DETECTED NOT DETECTED Final   Metapneumovirus NOT DETECTED NOT DETECTED Final   Rhinovirus / Enterovirus NOT DETECTED NOT DETECTED Final   Influenza A NOT DETECTED NOT DETECTED Final   Influenza B NOT DETECTED NOT DETECTED Final   Parainfluenza Virus 1 NOT DETECTED NOT DETECTED Final   Parainfluenza Virus 2 NOT DETECTED NOT DETECTED Final   Parainfluenza Virus 3 NOT DETECTED NOT DETECTED Final   Parainfluenza Virus 4 NOT DETECTED NOT DETECTED Final   Respiratory Syncytial Virus NOT DETECTED NOT DETECTED Final   Bordetella pertussis NOT DETECTED NOT DETECTED Final   Bordetella Parapertussis NOT DETECTED NOT DETECTED Final   Chlamydophila pneumoniae NOT DETECTED NOT DETECTED Final   Mycoplasma pneumoniae NOT DETECTED NOT DETECTED Final    Comment: Performed at [facility], [address] [place], [place] [address]  MRSA Next Gen by PCR, Nasal     Status: None   Collection Time: 04/14/23  2:30 AM   Specimen: Nasal Mucosa; Nasal Swab  Result Value Ref Range Status   MRSA by PCR Next Gen NOT DETECTED NOT DETECTED Final    Comment: (NOTE) The GeneXpert MRSA Assay (FDA approved for NASAL specimens only), is one component of a comprehensive MRSA colonization surveillance program. It is not intended to  diagnose MRSA infection nor to guide or monitor treatment for MRSA infections. Test performance is not FDA approved in patients less than 68 years old. Performed at [facility], [address] [place], [place] [address]   Culture, blood (Routine X 2) w Reflex to ID Panel     Status: None   Collection Time: 04/14/23  9:25 AM   Specimen: BLOOD  Result Value Ref Range Status  Specimen Description BLOOD BLOOD RIGHT HAND  Final   Special Requests   Final    BOTTLES DRAWN AEROBIC AND ANAEROBIC Blood Culture adequate volume   Culture   Final    NO GROWTH 5 DAYS Performed at [facility], [address] [place], [place] [address]    Report Status 04/19/2023 FINAL  Final  Culture, blood (Routine X 2) w Reflex to ID Panel     Status: Abnormal   Collection Time: 04/14/23  9:25 AM   Specimen: BLOOD LEFT HAND  Result Value Ref Range Status   Specimen Description   Final    BLOOD LEFT HAND Performed at [facility], [address] [place], [place] [address]    Special Requests   Final    BOTTLES DRAWN AEROBIC AND ANAEROBIC Blood Culture adequate volume Performed at [facility], [address] [place], [place] [address]    Culture  Setup Time   Final    GRAM POSITIVE COCCI ANAEROBIC BOTTLE ONLY CRITICAL RESULT CALLED TO, READ BACK BY AND VERIFIED WITH: JASON [email or web address]  04/17/13 RH Performed at [facility], [address] [place], [place] [address]    Culture STAPHYLOCOCCUS LUGDUNENSIS (A)  Final   Report Status 04/20/2023 FINAL  Final   Organism ID, Bacteria STAPHYLOCOCCUS LUGDUNENSIS  Final      Susceptibility   Staphylococcus lugdunensis - MIC*    CIPROFLOXACIN Value in next row Sensitive      SENSITIVE<=0.5    ERYTHROMYCIN Value in next row Sensitive      SENSITIVE<=0.25    GENTAMICIN Value in next row Sensitive      SENSITIVE<=0.5    OXACILLIN Value in next row Resistant      RESISTANT>=4    TETRACYCLINE Value in next row  Sensitive      SENSITIVE<=1    VANCOMYCIN Value in next row Sensitive      SENSITIVE<=0.5    TRIMETH/SULFA Value in next row Sensitive      SENSITIVE<=10    CLINDAMYCIN Value in next row Sensitive      SENSITIVE<=0.25    RIFAMPIN Value in next row Sensitive      SENSITIVE<=0.5    Inducible Clindamycin Value in next row Sensitive      SENSITIVE<=0.5    LINEZOLID Value in next row Sensitive      SENSITIVE2    * STAPHYLOCOCCUS LUGDUNENSIS  Blood Culture ID Panel (Reflexed)     Status: Abnormal   Collection Time: 04/14/23  9:25 AM  Result Value Ref Range Status   Enterococcus faecalis NOT DETECTED NOT DETECTED Final   Enterococcus Faecium NOT DETECTED NOT DETECTED Final   Listeria monocytogenes NOT DETECTED NOT DETECTED Final   Staphylococcus species DETECTED (A) NOT DETECTED Final    Comment: CRITICAL RESULT CALLED TO, READ BACK BY AND VERIFIED WITH: JASON [email or web address]  04/18/23 RH    Staphylococcus aureus (BCID) NOT DETECTED NOT DETECTED Final   Staphylococcus epidermidis NOT DETECTED NOT DETECTED Final   Staphylococcus lugdunensis DETECTED (A) NOT DETECTED Final    Comment: CRITICAL RESULT CALLED TO, READ BACK BY AND VERIFIED WITH: JASON [email or web address]  04/18/23 RH    Streptococcus species NOT DETECTED NOT DETECTED Final   Streptococcus agalactiae NOT DETECTED NOT DETECTED Final   Streptococcus pneumoniae NOT DETECTED NOT DETECTED Final   Streptococcus pyogenes NOT DETECTED NOT DETECTED Final   A.calcoaceticus-baumannii NOT DETECTED NOT DETECTED Final   Bacteroides fragilis NOT DETECTED NOT DETECTED Final   Enterobacterales NOT DETECTED NOT  DETECTED Final   Enterobacter cloacae complex NOT DETECTED NOT DETECTED Final   Escherichia coli NOT DETECTED NOT DETECTED Final   Klebsiella aerogenes NOT DETECTED NOT DETECTED Final   Klebsiella oxytoca NOT DETECTED NOT DETECTED Final   Klebsiella pneumoniae NOT DETECTED NOT DETECTED Final   Proteus species NOT DETECTED NOT DETECTED Final    Salmonella species NOT DETECTED NOT DETECTED Final   Serratia marcescens NOT DETECTED NOT DETECTED Final   Haemophilus influenzae NOT DETECTED NOT DETECTED Final   Neisseria meningitidis NOT DETECTED NOT DETECTED Final   Pseudomonas aeruginosa NOT DETECTED NOT DETECTED Final   Stenotrophomonas maltophilia NOT DETECTED NOT DETECTED Final   Candida albicans NOT DETECTED NOT DETECTED Final   Candida auris NOT DETECTED NOT DETECTED Final   Candida glabrata NOT DETECTED NOT DETECTED Final   Candida krusei NOT DETECTED NOT DETECTED Final   Candida parapsilosis NOT DETECTED NOT DETECTED Final   Candida tropicalis NOT DETECTED NOT DETECTED Final   Cryptococcus neoformans/gattii NOT DETECTED NOT DETECTED Final   Methicillin resistance mecA/C NOT DETECTED NOT DETECTED Final    Comment: Performed at [facility], [address] [place], [place] [address]  Culture, blood (Routine X 2) w Reflex to ID Panel     Status: None (Preliminary result)   Collection Time: 04/18/23 10:47 AM   Specimen: BLOOD  Result Value Ref Range Status   Specimen Description BLOOD BLOOD RIGHT HAND  Final   Special Requests   Final    BOTTLES DRAWN AEROBIC AND ANAEROBIC Blood Culture results may not be optimal due to an excessive volume of blood received in culture bottles   Culture   Final    NO GROWTH 3 DAYS Performed at [facility], [address] [place], [place] [address]    Report Status PENDING  Incomplete  Culture, blood (Routine X 2) w Reflex to ID Panel     Status: None (Preliminary result)   Collection Time: 04/18/23 10:55 AM   Specimen: BLOOD  Result Value Ref Range Status   Specimen Description BLOOD BLOOD LEFT HAND  Final   Special Requests   Final    BOTTLES DRAWN AEROBIC AND ANAEROBIC Blood Culture results may not be optimal due to an excessive volume of blood received in culture bottles   Culture   Final    NO GROWTH 3 DAYS Performed at [facility], [address] [place], [place] [address]    Report Status PENDING  Incomplete     Labs: Basic Metabolic Panel: Recent Labs  Lab 04/17/23 0432 04/18/23 0939 04/19/23 0445 04/20/23 0440 04/21/23 0409  NA 134* 131* 130* 130* 130*  K 4.1 3.9 3.7 3.7 3.5  CL 88* 90* 86* 87* 89*  CO2 30 30 29 28 27   GLUCOSE 102* 183* 137* 151* 139*  BUN 71* 90* 96* 92* 103*  CREATININE 3.20* 3.09* 3.31* 3.38* 3.46*  CALCIUM 10.1 9.3 9.5 9.2 8.7*  PHOS 4.4  --   --  4.7* 4.6   Liver Function Tests: Recent Labs  Lab 04/17/23 0432 04/20/23 0440 04/21/23 0409  ALBUMIN 3.4* 3.2* 3.0*  3.0*   No results for input(s): "LIPASE", "AMYLASE" in the last 168 hours. No results for input(s): "AMMONIA" in the last 168 hours. CBC: Recent Labs  Lab 04/15/23 0433 04/16/23 0543 04/19/23 0445 04/21/23 0409  WBC 17.6* 14.3* 17.4* 16.7*  NEUTROABS  --   --  11.6*  --   HGB 11.3* 11.9* 11.6* 11.3*  HCT 33.6*  37.2* 35.1* 34.9*  MCV 95.2 97.9 93.9 96.1  PLT 169 201 256 257   Cardiac Enzymes: Recent Labs  Lab 04/20/23 0440  CKTOTAL 181   BNP: BNP (last 3 results) Recent Labs    04/21/22 2021 04/12/23 1305  BNP 54.6 89.7    ProBNP (last 3 results) No results for input(s): "PROBNP" in the last 8760 hours.  CBG: Recent Labs  Lab 04/20/23 1101 04/20/23 1621 04/20/23 2304 04/21/23 0809 04/21/23 1139  GLUCAP 174* 104* 120* 150* 124*       Signed:  Darlin Drop, MD [facility] 04/21/2023, 1:48 PM

## 2023-04-21 NOTE — Progress Notes (Signed)
Contacted vascular services to verify if morning medications can be given to patient advised yes keep patient NPO.

## 2023-04-21 NOTE — Progress Notes (Signed)
Patient off of floor for vascular procedure.

## 2023-04-22 LAB — MISC LABCORP TEST (SEND OUT)
LabCorp test name: 1
Labcorp test code: 96833

## 2023-04-23 LAB — CULTURE, BLOOD (ROUTINE X 2)
Culture: NO GROWTH
Culture: NO GROWTH

## 2023-04-24 DIAGNOSIS — J189 Pneumonia, unspecified organism: Secondary | ICD-10-CM | POA: Diagnosis not present

## 2023-04-24 DIAGNOSIS — R7881 Bacteremia: Secondary | ICD-10-CM | POA: Diagnosis not present

## 2023-04-24 NOTE — Progress Notes (Deleted)
Advanced Heart Failure Clinic Note   Referring Physician: PCP: Enid Baas, MD Cardiologist: None   HPI:  Mr Distad is a 72 y/o male with a history of  Was in the ED 01/03/23 due to COPD exacerbation. Admitted 04/12/23 due to increasing shortness of breath, leg swelling and chest pain.  Workup revealed sepsis secondary to pneumonia, Staphylococcus lugdunensis bacteremia, T5-T6 discitis and osteomyelitis, acute on chronic systolic CHF. ID, cardiology, nephrology, and vascular surgery consulted. 6 weeks of IV antibiotics.   Echo 10/21/17: EF 50-55% with mild LVH Echo 08/25/18: EF 40-45% with mild MR Echo 04/14/23: EF 55-60%  LHC 10/23/17: Ost 1st Sept lesion is 70% stenosed. Ost 2nd Mrg to 2nd Mrg lesion is 50% stenosed. Mid RCA lesion is 100% stenosed.  1.  One-vessel CAD with occluded mid RCA with contra-collaterals 2.  Normal left ventricular function  He presents today for his initial HF visit with a chief complaint of   Review of Systems: [y] = yes, [ ]  = no   General: Weight gain [ ] ; Weight loss [ ] ; Anorexia [ ] ; Fatigue [ ] ; Fever [ ] ; Chills [ ] ; Weakness [ ]   Cardiac: Chest pain/pressure [ ] ; Resting SOB [ ] ; Exertional SOB [ ] ; Orthopnea [ ] ; Pedal Edema [ ] ; Palpitations [ ] ; Syncope [ ] ; Presyncope [ ] ; Paroxysmal nocturnal dyspnea[ ]   Pulmonary: Cough [ ] ; Wheezing[ ] ; Hemoptysis[ ] ; Sputum [ ] ; Snoring [ ]   GI: Vomiting[ ] ; Dysphagia[ ] ; Melena[ ] ; Hematochezia [ ] ; Heartburn[ ] ; Abdominal pain [ ] ; Constipation [ ] ; Diarrhea [ ] ; BRBPR [ ]   GU: Hematuria[ ] ; Dysuria [ ] ; Nocturia[ ]   Vascular: Pain in legs with walking [ ] ; Pain in feet with lying flat [ ] ; Non-healing sores [ ] ; Stroke [ ] ; TIA [ ] ; Slurred speech [ ] ;  Neuro: Headaches[ ] ; Vertigo[ ] ; Seizures[ ] ; Paresthesias[ ] ;Blurred vision [ ] ; Diplopia [ ] ; Vision changes [ ]   Ortho/Skin: Arthritis [ ] ; Joint pain [ ] ; Muscle pain [ ] ; Joint swelling [ ] ; Back Pain [ ] ; Rash [ ]   Psych: Depression[ ] ;  Anxiety[ ]   Heme: Bleeding problems [ ] ; Clotting disorders [ ] ; Anemia [ ]   Endocrine: Diabetes [ ] ; Thyroid dysfunction[ ]    Past Medical History:  Diagnosis Date   CHF (congestive heart failure) (HCC)    COPD (chronic obstructive pulmonary disease) (HCC)    Diabetes mellitus without complication (HCC)    Edema    leg swelling   Gastroesophageal reflux disease 12/15/2017   Gout    Hypertension    Melena 11/05/2017   Pneumonia 07/27/2016    Current Outpatient Medications  Medication Sig Dispense Refill   albuterol (VENTOLIN HFA) 108 (90 Base) MCG/ACT inhaler Inhale 1-2 puffs into the lungs every 6 (six) hours as needed for wheezing or shortness of breath. 1 each 5   allopurinol (ZYLOPRIM) 100 MG tablet TAKE ONE TABLET (100 MG) BY MOUTH ONCE DAILY 90 tablet 1   amiodarone (PACERONE) 200 MG tablet Take 1 tablet (200 mg total) by mouth daily. 30 tablet 0   aspirin EC 81 MG tablet Take 81 mg by mouth daily. Swallow whole.     budesonide-formoterol (SYMBICORT) 160-4.5 MCG/ACT inhaler Inhale 2 puffs into the lungs 2 (two) times daily.     calcitRIOL (ROCALTROL) 0.25 MCG capsule Take 0.25 mcg by mouth daily.     carvedilol (COREG) 6.25 MG tablet Take 6.25 mg by mouth 2 (two) times daily with a meal. Pt  gets from cardiologist     cefTRIAXone (ROCEPHIN) IVPB Inject 2 g into the vein daily. Indication:  Vertebral osteomyelitis First Dose: Yes Last Day of Therapy:  05/30/23 Labs - Once weekly: CBC with differential, CK, CMP, CRP, ESR Monitor for rhabomyolysis or eosinophilia Fax weekly lab results  promptly to [phone]  Please leave central line  place until doctor ( vascular surgeon) has seen patient and will remove the cath Method of administration: IV Push Method of administration may be changed at the discretion of home infusion pharmacist based upon assessment of the patient and/or caregiver's ability to self-administer the medication ordered. 39 Units 0   Cholecalciferol (VITAMIN  D3) 50 MCG (2000 UT) TABS Take 2,000 Units by mouth at bedtime. 30 tablet 2   daptomycin (CUBICIN) IVPB Inject 800 mg into the vein every other day. Indication:  Vertebral osteomyelitis First Dose: Yes Last Day of Therapy:  05/30/23 Labs - Once weekly:  CBC with differential, CK, CMP, CRP, ESR Monitor for rhabomyolysis or eosinophilia Fax weekly lab results  promptly to [phone]  Please leave central line  place until doctor ( vascular surgeon) has seen patient and will remove the cath Method of administration: IV Push Method of administration may be changed at the discretion of home infusion pharmacist based upon assessment of the patient and/or caregiver's ability to self-administer the medication ordered. 20 Units 0   diphenhydrAMINE (BENADRYL) 25 mg capsule Take 25 mg by mouth every 6 (six) hours as needed for itching or allergies.     doxazosin (CARDURA) 4 MG tablet TAKE 1 TABLET BY MOUTH AT BEDTIME 30 tablet 3   insulin aspart (NOVOLOG) 100 UNIT/ML FlexPen Inject 5 Units into the skin 3 (three) times daily with meals. 15 mL 0   insulin glargine (LANTUS) 100 UNIT/ML Solostar Pen Inject 30 Units into the skin 2 (two) times daily. 30 mL 0   ipratropium (ATROVENT) 0.02 % nebulizer solution Take 2.5 mLs (0.5 mg total) by nebulization every 6 (six) hours as needed for wheezing or shortness of breath. 225 mL 1   isosorbide mononitrate (IMDUR) 30 MG 24 hr tablet Take 30 mg by mouth daily.     levothyroxine (SYNTHROID) 112 MCG tablet Take 112 mcg by mouth daily before breakfast.     lidocaine (LIDODERM) 5 % Place 1 patch onto the skin daily. Remove & Discard patch within 12 hours or as directed by MD 30 patch 0   linagliptin (TRADJENTA) 5 MG TABS tablet Take 1 tablet (5 mg total) by mouth daily. 30 tablet 0   loratadine (CLARITIN) 10 MG tablet Take 10 mg by mouth daily.      multivitamin (ONE-A-DAY MEN'S) TABS tablet Take 1 tablet by mouth daily.     nitroGLYCERIN (NITROSTAT) 0.4 MG SL tablet  Place 1 tablet (0.4 mg total) under the tongue every 5 (five) minutes as needed for chest pain. 30 tablet 0   oxyCODONE 10 MG TABS Take 1 tablet (10 mg total) by mouth every 6 (six) hours as needed for up to 5 days for breakthrough pain or severe pain. 20 tablet 0   pantoprazole (PROTONIX) 40 MG tablet Take 40 mg by mouth daily.     potassium chloride (KLOR-CON) 10 MEQ tablet TAKE TWO TABLETS EVERY OTHER DAY AND 1 TABLET ON THE DAYS IN BETWEEN (Patient taking differently: 30 mEq daily.) 180 tablet 1   spironolactone (ALDACTONE) 25 MG tablet Take 0.5 tablets (12.5 mg total) by mouth daily. 30 tablet 0  torsemide (DEMADEX) 20 MG tablet Take 2 tablets (40 mg total) by mouth daily. 30 tablet 0   No current facility-administered medications for this visit.    Allergies  Allergen Reactions   Atorvastatin Other (See Comments)    Other reaction(s): Not available Other reaction(s): Other (See Comments)    Red Dye #40 (Allura Red) Other (See Comments)    "not as allergic to it as I used to be. I can take a little of it"      Social History   Socioeconomic History   Marital status: Single    Spouse name: Not on file   Number of children: Not on file   Years of education: Not on file   Highest education level: Not on file  Occupational History   Not on file  Tobacco Use   Smoking status: Never   Smokeless tobacco: Never  Substance and Sexual Activity   Alcohol use: Not Currently    Comment: ocassionally   Drug use: Never   Sexual activity: Not on file  Other Topics Concern   Not on file  Social History Narrative   Not on file   Social Determinants of Health   Financial Resource Strain: Not on file  Food Insecurity: No Food Insecurity (04/13/2023)   Hunger Vital Sign    Worried About Running Out of Food in the Last Year: Never true    Ran Out of Food in the Last Year: Never true  Transportation Needs: No Transportation Needs (04/13/2023)   PRAPARE - Scientist, research (physical sciences) (Medical): No    Lack of Transportation (Non-Medical): No  Physical Activity: Not on file  Stress: Not on file  Social Connections: Not on file  Intimate Partner Violence: Not At Risk (04/13/2023)   Humiliation, Afraid, Rape, and Kick questionnaire    Fear of Current or Ex-Partner: No    Emotionally Abused: No    Physically Abused: No    Sexually Abused: No      Family History  Problem Relation Age of Onset   Cancer Mother    CVA Father    Heart disease Father     There were no vitals filed for this visit.   PHYSICAL EXAM: General:  Well appearing. No respiratory difficulty HEENT: normal Neck: supple. no JVD. Carotids 2+ bilat; no bruits. No lymphadenopathy or thyromegaly appreciated. Cor: PMI nondisplaced. Regular rate & rhythm. No rubs, gallops or murmurs. Lungs: clear Abdomen: soft, nontender, nondistended. No hepatosplenomegaly. No bruits or masses. Good bowel sounds. Extremities: no cyanosis, clubbing, rash, edema Neuro: alert & oriented x 3, cranial nerves grossly intact. moves all 4 extremities w/o difficulty. Affect pleasant.  ECG:   ASSESSMENT & PLAN:  1: Chronic heart failure with preserved ejection fraction- - suspect - NYHA class - euvolemic - weighing daily - Echo 10/21/17: EF 50-55% with mild LVH - Echo 08/25/18: EF 40-45% with mild MR - Echo 04/14/23: EF 55-60%  - BNP  2:   Delma Freeze, FNP 04/24/23

## 2023-04-25 ENCOUNTER — Encounter: Payer: PPO | Admitting: Family

## 2023-04-25 ENCOUNTER — Telehealth: Payer: Self-pay | Admitting: Family

## 2023-04-25 ENCOUNTER — Encounter: Payer: Self-pay | Admitting: Vascular Surgery

## 2023-04-25 LAB — MINIMUM INHIBITORY CONC. (1 DRUG)

## 2023-04-25 LAB — MIC RESULT

## 2023-04-25 MED ORDER — LIDOCAINE-EPINEPHRINE (PF) 1 %-1:200000 IJ SOLN
INTRAMUSCULAR | Status: DC | PRN
  Administered 2023-04-21: 20 mL

## 2023-04-25 MED ORDER — HEPARIN (PORCINE) IN NACL 1000-0.9 UT/500ML-% IV SOLN
INTRAVENOUS | Status: DC | PRN
  Administered 2023-04-21: 500 mL

## 2023-04-25 NOTE — Telephone Encounter (Signed)
Patient did not show for his initial Heart Failure Clinic appointment on 04/25/23.

## 2023-04-26 DIAGNOSIS — I5023 Acute on chronic systolic (congestive) heart failure: Secondary | ICD-10-CM | POA: Diagnosis not present

## 2023-04-26 DIAGNOSIS — I10 Essential (primary) hypertension: Secondary | ICD-10-CM | POA: Diagnosis not present

## 2023-04-26 DIAGNOSIS — K5909 Other constipation: Secondary | ICD-10-CM | POA: Diagnosis not present

## 2023-04-26 DIAGNOSIS — J189 Pneumonia, unspecified organism: Secondary | ICD-10-CM | POA: Diagnosis not present

## 2023-04-28 DIAGNOSIS — R7881 Bacteremia: Secondary | ICD-10-CM | POA: Diagnosis not present

## 2023-04-28 DIAGNOSIS — J189 Pneumonia, unspecified organism: Secondary | ICD-10-CM | POA: Diagnosis not present

## 2023-04-28 DIAGNOSIS — D72829 Elevated white blood cell count, unspecified: Secondary | ICD-10-CM | POA: Diagnosis not present

## 2023-04-28 DIAGNOSIS — N184 Chronic kidney disease, stage 4 (severe): Secondary | ICD-10-CM | POA: Diagnosis not present

## 2023-04-29 ENCOUNTER — Emergency Department
Admission: EM | Admit: 2023-04-29 | Discharge: 2023-04-29 | Disposition: A | Discharge status: Home / Self Care | Payer: PPO | Attending: Emergency Medicine | Admitting: Emergency Medicine

## 2023-04-29 ENCOUNTER — Other Ambulatory Visit: Payer: Self-pay

## 2023-04-29 ENCOUNTER — Emergency Department: Payer: PPO

## 2023-04-29 ENCOUNTER — Encounter: Payer: Self-pay | Admitting: *Deleted

## 2023-04-29 DIAGNOSIS — Z79899 Other long term (current) drug therapy: Secondary | ICD-10-CM | POA: Insufficient documentation

## 2023-04-29 DIAGNOSIS — Z7982 Long term (current) use of aspirin: Secondary | ICD-10-CM | POA: Diagnosis not present

## 2023-04-29 DIAGNOSIS — R079 Chest pain, unspecified: Secondary | ICD-10-CM | POA: Diagnosis not present

## 2023-04-29 DIAGNOSIS — Z7951 Long term (current) use of inhaled steroids: Secondary | ICD-10-CM | POA: Insufficient documentation

## 2023-04-29 DIAGNOSIS — M79604 Pain in right leg: Secondary | ICD-10-CM | POA: Diagnosis not present

## 2023-04-29 DIAGNOSIS — Z7984 Long term (current) use of oral hypoglycemic drugs: Secondary | ICD-10-CM | POA: Diagnosis not present

## 2023-04-29 DIAGNOSIS — R0789 Other chest pain: Secondary | ICD-10-CM | POA: Diagnosis not present

## 2023-04-29 DIAGNOSIS — R0989 Other specified symptoms and signs involving the circulatory and respiratory systems: Secondary | ICD-10-CM | POA: Diagnosis not present

## 2023-04-29 DIAGNOSIS — E1122 Type 2 diabetes mellitus with diabetic chronic kidney disease: Secondary | ICD-10-CM | POA: Diagnosis not present

## 2023-04-29 DIAGNOSIS — E785 Hyperlipidemia, unspecified: Secondary | ICD-10-CM | POA: Diagnosis not present

## 2023-04-29 DIAGNOSIS — Z20822 Contact with and (suspected) exposure to covid-19: Secondary | ICD-10-CM | POA: Diagnosis not present

## 2023-04-29 DIAGNOSIS — I5043 Acute on chronic combined systolic (congestive) and diastolic (congestive) heart failure: Secondary | ICD-10-CM | POA: Insufficient documentation

## 2023-04-29 DIAGNOSIS — J449 Chronic obstructive pulmonary disease, unspecified: Secondary | ICD-10-CM | POA: Diagnosis not present

## 2023-04-29 DIAGNOSIS — E1169 Type 2 diabetes mellitus with other specified complication: Secondary | ICD-10-CM | POA: Diagnosis not present

## 2023-04-29 DIAGNOSIS — J45909 Unspecified asthma, uncomplicated: Secondary | ICD-10-CM | POA: Insufficient documentation

## 2023-04-29 DIAGNOSIS — N184 Chronic kidney disease, stage 4 (severe): Secondary | ICD-10-CM | POA: Insufficient documentation

## 2023-04-29 DIAGNOSIS — I517 Cardiomegaly: Secondary | ICD-10-CM | POA: Diagnosis not present

## 2023-04-29 DIAGNOSIS — J9811 Atelectasis: Secondary | ICD-10-CM | POA: Diagnosis not present

## 2023-04-29 DIAGNOSIS — I130 Hypertensive heart and chronic kidney disease with heart failure and stage 1 through stage 4 chronic kidney disease, or unspecified chronic kidney disease: Secondary | ICD-10-CM | POA: Insufficient documentation

## 2023-04-29 DIAGNOSIS — Z794 Long term (current) use of insulin: Secondary | ICD-10-CM | POA: Diagnosis not present

## 2023-04-29 LAB — BASIC METABOLIC PANEL
Anion gap: 11 (ref 5–15)
BUN: 81 mg/dL — ABNORMAL HIGH (ref 8–23)
CO2: 26 mmol/L (ref 22–32)
Calcium: 8.9 mg/dL (ref 8.9–10.3)
Chloride: 91 mmol/L — ABNORMAL LOW (ref 98–111)
Creatinine, Ser: 2.53 mg/dL — ABNORMAL HIGH (ref 0.61–1.24)
GFR, Estimated: 27 mL/min — ABNORMAL LOW (ref >60)
Glucose, Bld: 318 mg/dL — ABNORMAL HIGH (ref 70–99)
Potassium: 4.8 mmol/L (ref 3.5–5.1)
Sodium: 128 mmol/L — ABNORMAL LOW (ref 135–145)

## 2023-04-29 LAB — CBC
HCT: 32.6 % — ABNORMAL LOW (ref 39.0–52.0)
Hemoglobin: 10.6 g/dL — ABNORMAL LOW (ref 13.0–17.0)
MCH: 31 pg (ref 26.0–34.0)
MCHC: 32.5 g/dL (ref 30.0–36.0)
MCV: 95.3 fL (ref 80.0–100.0)
Platelets: 266 10*3/uL (ref 150–400)
RBC: 3.42 MIL/uL — ABNORMAL LOW (ref 4.22–5.81)
RDW: 13.6 % (ref 11.5–15.5)
WBC: 16.6 10*3/uL — ABNORMAL HIGH (ref 4.0–10.5)
nRBC: 0 % (ref 0.0–0.2)

## 2023-04-29 LAB — TROPONIN I (HIGH SENSITIVITY)
Troponin I (High Sensitivity): 16 ng/L (ref <18)
Troponin I (High Sensitivity): 17 ng/L (ref <18)

## 2023-04-29 LAB — HEPATIC FUNCTION PANEL
ALT: 18 U/L (ref 0–44)
AST: 31 U/L (ref 15–41)
Albumin: 3.1 g/dL — ABNORMAL LOW (ref 3.5–5.0)
Alkaline Phosphatase: 106 U/L (ref 38–126)
Bilirubin, Direct: 0.3 mg/dL — ABNORMAL HIGH (ref 0.0–0.2)
Indirect Bilirubin: 0.7 mg/dL (ref 0.3–0.9)
Total Bilirubin: 1 mg/dL (ref 0.3–1.2)
Total Protein: 7.6 g/dL (ref 6.5–8.1)

## 2023-04-29 LAB — LIPASE, BLOOD: Lipase: 45 U/L (ref 11–51)

## 2023-04-29 LAB — SARS CORONAVIRUS 2 BY RT PCR: SARS Coronavirus 2 by RT PCR: NEGATIVE

## 2023-04-29 MED ORDER — ONDANSETRON HCL 4 MG/2ML IJ SOLN
4.0000 mg | Freq: Once | INTRAMUSCULAR | Status: AC
  Administered 2023-04-29: 4 mg via INTRAVENOUS
  Filled 2023-04-29: qty 2

## 2023-04-29 MED ORDER — MORPHINE SULFATE (PF) 4 MG/ML IV SOLN
4.0000 mg | Freq: Once | INTRAVENOUS | Status: AC
  Administered 2023-04-29: 4 mg via INTRAVENOUS
  Filled 2023-04-29: qty 1

## 2023-04-29 MED ORDER — ACETAMINOPHEN 500 MG PO TABS
1000.0000 mg | ORAL_TABLET | Freq: Once | ORAL | Status: AC
  Administered 2023-04-29: 1000 mg via ORAL
  Filled 2023-04-29: qty 2

## 2023-04-29 MED ORDER — SODIUM CHLORIDE 0.9 % IV BOLUS
500.0000 mL | Freq: Once | INTRAVENOUS | Status: AC
  Administered 2023-04-29: 500 mL via INTRAVENOUS

## 2023-04-29 NOTE — ED Provider Notes (Signed)
Patient received in signout from Dr. Dolores Frame pending follow-up repeat troponin.  Repeat troponin is negative.  Did have an episode of some crampy leg pain for which lower extremity duplex was ordered which shows no evidence of DVT.  Well-perfused.  Likely muscle cramps.  Does appear stable and appropriate for outpatient follow-up.   Willy Eddy, MD 04/29/23 [phone]

## 2023-04-29 NOTE — ED Triage Notes (Addendum)
Per EMS Report: Pt arrives from [facility] facility. He is c/o CP, central and tight in nature. Has had recent chest pain, but this is different. En route 125/67, hr 78, 97% on chronic 3 liters. Pt has single lumen CVC access for abx therapy. (Given ASA and nitroglycerin PTA of EMS)

## 2023-04-29 NOTE — ED Provider Notes (Signed)
[facility] Provider Note    Event Date/Time   First MD Initiated Contact with Patient 04/29/23 0522     (approximate)   History   Chest Pain   HPI  Mateja Der is a 72 y.o. male brought to the ED via EMS from [facility] with a chief complaint of chest pain.  Patient with a history of CHF, COPD on chronic 3 L nasal cannula oxygen.  Currently on IV antibiotic therapy for vertebral osteomyelitis.  Reports dull/tight feeling in central chest all day with some nasal congestion.  Denies fever/chills, cough, shortness of breath, abdominal pain, nausea, vomiting or dizziness.     Past Medical History   Past Medical History:  Diagnosis Date   CHF (congestive heart failure) (HCC)    COPD (chronic obstructive pulmonary disease) (HCC)    Diabetes mellitus without complication (HCC)    Edema    leg swelling   Gastroesophageal reflux disease 12/15/2017   Gout    Hypertension    Melena 11/05/2017   Pneumonia 07/27/2016     Active Problem List   Patient Active Problem List   Diagnosis Date Noted   Vertebral osteomyelitis (HCC) 04/20/2023   Coag negative Staphylococcus bacteremia 04/19/2023   Discitis thoracic region 04/19/2023   Sepsis due to pneumonia (HCC) 04/13/2023   Leukocytosis 04/13/2023   Chest pain 04/13/2023   Paroxysmal atrial fibrillation (HCC) 04/13/2023   Obesity, Class III, BMI 40-49.9 (morbid obesity) (HCC) 04/13/2023   CHF (congestive heart failure) (HCC) 04/12/2023   Gastric ulcer 05/18/2021   Benign hypertensive kidney disease with chronic kidney disease 02/15/2021   Proteinuria 02/15/2021   Secondary hyperparathyroidism of renal origin (HCC) 02/15/2021   Chronic kidney disease, stage 4 (severe) (HCC) 10/28/2020   Hypertension 10/28/2020   Low back pain due to bilateral sciatica 02/19/2020   Lumbar radiculopathy 02/18/2020   Encounter for general adult medical examination with abnormal findings 08/31/2019   Flu  vaccine need 08/31/2019   Need for vaccination against Streptococcus pneumoniae using pneumococcal conjugate vaccine 13 08/31/2019   Dysuria 08/31/2019   Irritable bowel syndrome with constipation 05/29/2019   Chronic kidney disease due to type 2 diabetes mellitus (HCC) 05/22/2019   Type 2 diabetes mellitus with hyperglycemia (HCC) 03/17/2019   Clinically significant macular edema 03/01/2019   Lower extremity edema 12/08/2018   Edema of lower extremity 12/08/2018   Diabetic ulcer of calf (HCC) 11/10/2018   Restrictive lung disease 10/26/2018   Acute exacerbation of CHF (congestive heart failure) (HCC) 09/12/2018   Acute on chronic systolic congestive heart failure (HCC) 09/06/2018   AKI (acute kidney injury) (HCC) 09/06/2018   Acute kidney failure, unspecified (HCC) 09/06/2018   Moderate intermittent asthma without complication 08/30/2018   Coronary arteriosclerosis 08/30/2018   Diastolic dysfunction 08/30/2018   Hypercholesterolemia 08/30/2018   Obesity 08/30/2018   COPD exacerbation (HCC) 08/24/2018   Hyperlipidemia due to type 2 diabetes mellitus (HCC) 03/28/2018   Type 2 diabetes mellitus with chronic kidney disease, with long-term current use of insulin (HCC) 03/28/2018   Type 2 diabetes mellitus with stage 4 chronic kidney disease, with long-term current use of insulin (HCC) 03/28/2018   Type 2 diabetes mellitus (HCC) 03/28/2018   Essential hypertension 01/19/2018   Chronic obstructive lung disease (HCC) 12/15/2017   Gastroesophageal reflux disease 12/15/2017   Obstructive sleep apnea of adult 12/15/2017   Chronic obstructive pulmonary disease (HCC) 12/15/2017   Melena 11/05/2017   Congestive heart failure (HCC) 10/20/2017   Heart failure, unspecified (  HCC) 10/20/2017   Pneumonia 07/27/2016     Past Surgical History   Past Surgical History:  Procedure Laterality Date   CENTRAL LINE INSERTION-TUNNELED N/A 04/21/2023   Procedure: CENTRAL LINE INSERTION-TUNNELED;  Surgeon:  Gilda Crease Latina Craver, MD;  Location: [facility] INVASIVE CV LAB;  Service: Cardiovascular;  Laterality: N/A;   COLONOSCOPY WITH PROPOFOL N/A 11/07/2017   Procedure: COLONOSCOPY WITH PROPOFOL;  Surgeon: Wyline Mood, MD;  Location: [facility] ENDOSCOPY;  Service: Gastroenterology;  Laterality: N/A;   ESOPHAGOGASTRODUODENOSCOPY (EGD) WITH PROPOFOL N/A 11/07/2017   Procedure: ESOPHAGOGASTRODUODENOSCOPY (EGD) WITH PROPOFOL;  Surgeon: Wyline Mood, MD;  Location: [facility] ENDOSCOPY;  Service: Gastroenterology;  Laterality: N/A;   LEFT HEART CATH AND CORONARY ANGIOGRAPHY N/A 10/23/2017   Procedure: LEFT HEART CATH AND CORONARY ANGIOGRAPHY;  Surgeon: Marcina Millard, MD;  Location: [facility] INVASIVE CV LAB;  Service: Cardiovascular;  Laterality: N/A;     Home Medications   Prior to Admission medications   Medication Sig Start Date End Date Taking? Authorizing Provider  albuterol (VENTOLIN HFA) 108 (90 Base) MCG/ACT inhaler Inhale 1-2 puffs into the lungs every 6 (six) hours as needed for wheezing or shortness of breath. 01/03/23   Chesley Noon, MD  allopurinol (ZYLOPRIM) 100 MG tablet TAKE ONE TABLET (100 MG) BY MOUTH ONCE DAILY 09/09/22   Sallyanne Kuster, NP  amiodarone (PACERONE) 200 MG tablet Take 1 tablet (200 mg total) by mouth daily. 04/21/23 05/21/23  Darlin Drop, DO  aspirin EC 81 MG tablet Take 81 mg by mouth daily. Swallow whole.    [provider]  budesonide-formoterol (SYMBICORT) 160-4.5 MCG/ACT inhaler Inhale 2 puffs into the lungs 2 (two) times daily. 01/30/23 01/30/24  [provider]  calcitRIOL (ROCALTROL) 0.25 MCG capsule Take 0.25 mcg by mouth daily. 03/01/23   [provider]  carvedilol (COREG) 6.25 MG tablet Take 6.25 mg by mouth 2 (two) times daily with a meal. Pt gets from cardiologist    [provider]  cefTRIAXone (ROCEPHIN) IVPB Inject 2 g into the vein daily. Indication:  Vertebral osteomyelitis First Dose: Yes Last Day of Therapy:  05/30/23 Labs - Once  weekly: CBC with differential, CK, CMP, CRP, ESR Monitor for rhabomyolysis or eosinophilia Fax weekly lab results  promptly to [phone]  Please leave central line  place until doctor ( vascular surgeon) has seen patient and will remove the cath Method of administration: IV Push Method of administration may be changed at the discretion of home infusion pharmacist based upon assessment of the patient and/or caregiver's ability to self-administer the medication ordered. 04/21/23 05/30/23  Darlin Drop, DO  Cholecalciferol (VITAMIN D3) 50 MCG (2000 UT) TABS Take 2,000 Units by mouth at bedtime. 09/18/20   Theotis Burrow, NP  daptomycin (CUBICIN) IVPB Inject 800 mg into the vein every other day. Indication:  Vertebral osteomyelitis First Dose: Yes Last Day of Therapy:  05/30/23 Labs - Once weekly:  CBC with differential, CK, CMP, CRP, ESR Monitor for rhabomyolysis or eosinophilia Fax weekly lab results  promptly to [phone]  Please leave central line  place until doctor ( vascular surgeon) has seen patient and will remove the cath Method of administration: IV Push Method of administration may be changed at the discretion of home infusion pharmacist based upon assessment of the patient and/or caregiver's ability to self-administer the medication ordered. 04/21/23 05/31/23  Darlin Drop, DO  diphenhydrAMINE (BENADRYL) 25 mg capsule Take 25 mg by mouth every 6 (six) hours as needed for itching or allergies.  [provider]  doxazosin (CARDURA) 4 MG tablet TAKE 1 TABLET BY MOUTH AT BEDTIME 11/22/22   Abernathy, Arlyss Repress, NP  insulin aspart (NOVOLOG) 100 UNIT/ML FlexPen Inject 5 Units into the skin 3 (three) times daily with meals. 04/21/23   Darlin Drop, DO  insulin glargine (LANTUS) 100 UNIT/ML Solostar Pen Inject 30 Units into the skin 2 (two) times daily. 04/21/23   Darlin Drop, DO  ipratropium (ATROVENT) 0.02 % nebulizer solution Take 2.5 mLs (0.5 mg total) by nebulization  every 6 (six) hours as needed for wheezing or shortness of breath. 11/29/21   Sallyanne Kuster, NP  isosorbide mononitrate (IMDUR) 30 MG 24 hr tablet Take 30 mg by mouth daily. 08/13/21   [provider]  levothyroxine (SYNTHROID) 112 MCG tablet Take 112 mcg by mouth daily before breakfast. 09/25/20   [provider]  lidocaine (LIDODERM) 5 % Place 1 patch onto the skin daily. Remove & Discard patch within 12 hours or as directed by MD 04/21/23   Darlin Drop, DO  linagliptin (TRADJENTA) 5 MG TABS tablet Take 1 tablet (5 mg total) by mouth daily. 04/21/23   Darlin Drop, DO  loratadine (CLARITIN) 10 MG tablet Take 10 mg by mouth daily.     [provider]  multivitamin (ONE-A-DAY MEN'S) TABS tablet Take 1 tablet by mouth daily.    [provider]  nitroGLYCERIN (NITROSTAT) 0.4 MG SL tablet Place 1 tablet (0.4 mg total) under the tongue every 5 (five) minutes as needed for chest pain. 10/23/17   Adrian Saran, MD  pantoprazole (PROTONIX) 40 MG tablet Take 40 mg by mouth daily. 01/17/23   [provider]  potassium chloride (KLOR-CON) 10 MEQ tablet TAKE TWO TABLETS EVERY OTHER DAY AND 1 TABLET ON THE DAYS IN BETWEEN Patient taking differently: 30 mEq daily. 05/06/22   Sallyanne Kuster, NP  spironolactone (ALDACTONE) 25 MG tablet Take 0.5 tablets (12.5 mg total) by mouth daily. 04/22/23 06/21/23  Darlin Drop, DO  torsemide (DEMADEX) 20 MG tablet Take 2 tablets (40 mg total) by mouth daily. 04/21/23 05/21/23  Darlin Drop, DO     Allergies  Atorvastatin and Red dye #40 (allura red)   Family History   Family History  Problem Relation Age of Onset   Cancer Mother    CVA Father    Heart disease Father      Physical Exam  Triage Vital Signs: ED Triage Vitals  Encounter Vitals Group     BP 04/29/23 0436 (!) 105/53     Systolic BP Percentile --      Diastolic BP Percentile --      Pulse Rate 04/29/23 0436 80     Resp 04/29/23 0441 15     Temp  04/29/23 0441 98.5 F (36.9 C)     Temp Source 04/29/23 0441 Oral     SpO2 04/29/23 0436 92 %     Weight 04/29/23 0443 (!) 331 lb 12.7 oz (150.5 kg)     Height --      Head Circumference --      [facility] --      Pain Score 04/29/23 0438 6     Pain Loc --      Pain Education --      Exclude from Growth Chart --     Updated Vital Signs: BP 128/70   Pulse 73   Temp 98.5 F (36.9 C) (Oral)   Resp 12   Ht 5\' 8"  (  1.727 m)   Wt (!) 145.2 kg   SpO2 97%   BMI 48.66 kg/m    General: Awake, no distress.  CV:  RRR.  Good peripheral perfusion.  Resp:  Normal effort.  Diminished, otherwise CTAB. Abd:  Morbidly obese, nontender.  No distention.  Other:  Baseline lymphedema.   ED Results / Procedures / Treatments  Labs (all labs ordered are listed, but only abnormal results are displayed) Labs Reviewed  BASIC METABOLIC PANEL - Abnormal; Notable for the following components:      Result Value   Sodium 128 (*)    Chloride 91 (*)    Glucose, Bld 318 (*)    BUN 81 (*)    Creatinine, Ser 2.53 (*)    GFR, Estimated 27 (*)    All other components within normal limits  CBC - Abnormal; Notable for the following components:   WBC 16.6 (*)    RBC 3.42 (*)    Hemoglobin 10.6 (*)    HCT 32.6 (*)    All other components within normal limits  HEPATIC FUNCTION PANEL - Abnormal; Notable for the following components:   Albumin 3.1 (*)    Bilirubin, Direct 0.3 (*)    All other components within normal limits  SARS CORONAVIRUS 2 BY RT PCR  LIPASE, BLOOD  TROPONIN I (HIGH SENSITIVITY)  TROPONIN I (HIGH SENSITIVITY)     EKG  ED ECG REPORT I, Daysi Boggan J, the attending physician, personally viewed and interpreted this ECG.   Date: 04/29/2023  EKG Time: 0441  Rate: 103  Rhythm: sinus tachycardia  Axis: Normal  Intervals:left anterior fascicular block  ST&T Change: Nonspecific    RADIOLOGY I have independently visualized interpreted patient's imaging study as well as noted the  radiology interpretation:  X-ray: pulm venous congestion  Official radiology report(s): DG Chest Port 1 View  Result Date: 04/29/2023 CLINICAL DATA:  72 year old male with history of chest pain. EXAM: PORTABLE CHEST 1 VIEW COMPARISON:  Chest x-ray 04/14/2023. FINDINGS: Right internal jugular central venous catheter with tip terminating at the superior cavoatrial junction. Lung volumes are low. Elevation of the right hemidiaphragm. Bibasilar opacities (right greater than left) favored to reflect subsegmental atelectasis. No definite consolidative airspace disease. No pleural effusions. No pneumothorax. Cephalization of the pulmonary vasculature, without frank pulmonary edema. Heart size is mildly enlarged. Upper mediastinal contours are within normal limits. IMPRESSION: 1. Support apparatus, as above. 2. Low lung volumes with bibasilar subsegmental atelectasis. 3. Cardiomegaly with pulmonary venous congestion, but no frank pulmonary edema. Electronically Signed   By: Trudie Reed M.D.   On: 04/29/2023 06:31     PROCEDURES:  Critical Care performed: No  .1-3 Lead EKG Interpretation  Performed by: Irean Hong, MD Authorized by: Irean Hong, MD     Interpretation: normal     ECG rate:  98   ECG rate assessment: normal     Rhythm: sinus rhythm     Ectopy: none     Conduction: normal   Comments:     Patient placed on cardiac monitor to evaluate for arrhythmias    MEDICATIONS ORDERED IN ED: Medications  morphine (PF) 4 MG/ML injection 4 mg (4 mg Intravenous Given 04/29/23 0544)  ondansetron (ZOFRAN) injection 4 mg (4 mg Intravenous Given 04/29/23 0544)  sodium chloride 0.9 % bolus 500 mL (500 mLs Intravenous Bolus 04/29/23 0600)     IMPRESSION / MDM / ASSESSMENT AND PLAN / ED COURSE  I reviewed the triage vital signs and the  nursing notes.                             72 year old male presenting with chest pain. Differential diagnosis includes, but is not limited to, ACS, aortic  dissection, pulmonary embolism, cardiac tamponade, pneumothorax, pneumonia, pericarditis, myocarditis, GI-related causes including esophagitis/gastritis, and musculoskeletal chest wall pain.   I personally reviewed patient's records and note a recent hospital admission for vertebral osteomyelitis from 8/21 - 04/21/2023.  Patient's presentation is most consistent with acute presentation with potential threat to life or bodily function.  The patient is on the cardiac monitor to evaluate for evidence of arrhythmia and/or significant heart rate changes.  Will obtain cardiac panel, chest x-ray, COVID swab.  Administer IV morphine for pain and reassess.  Clinical Course as of 04/29/23 1610  Sat Apr 29, 2023  [id number] Patient feeling better, resting in no acute distress. Awaiting repeat troponin. Care transferred to oncoming provider at change of shift. [JS]    Clinical Course User Index [JS] Irean Hong, MD     FINAL CLINICAL IMPRESSION(S) / ED DIAGNOSES   Final diagnoses:  Chest pain, unspecified type     Rx / DC Orders   ED Discharge Orders     None        Note:  This document was prepared using Dragon voice recognition software and may include unintentional dictation errors.   Irean Hong, MD 04/29/23 [id number]

## 2023-04-29 NOTE — ED Triage Notes (Signed)
Pt says that he has been having a dullness in his chest all day, the pain has become worse during the day, tightness in his chest with breathing. Headache, some congestion.

## 2023-04-29 NOTE — ED Notes (Signed)
EMS called for transport to [facility]

## 2023-05-01 DIAGNOSIS — G4733 Obstructive sleep apnea (adult) (pediatric): Secondary | ICD-10-CM | POA: Diagnosis not present

## 2023-05-01 DIAGNOSIS — R0602 Shortness of breath: Secondary | ICD-10-CM | POA: Diagnosis not present

## 2023-05-01 DIAGNOSIS — I509 Heart failure, unspecified: Secondary | ICD-10-CM | POA: Diagnosis not present

## 2023-05-01 DIAGNOSIS — I5032 Chronic diastolic (congestive) heart failure: Secondary | ICD-10-CM | POA: Diagnosis not present

## 2023-05-01 DIAGNOSIS — I2510 Atherosclerotic heart disease of native coronary artery without angina pectoris: Secondary | ICD-10-CM | POA: Diagnosis not present

## 2023-05-01 DIAGNOSIS — N184 Chronic kidney disease, stage 4 (severe): Secondary | ICD-10-CM | POA: Diagnosis not present

## 2023-05-01 DIAGNOSIS — R7881 Bacteremia: Secondary | ICD-10-CM | POA: Diagnosis not present

## 2023-05-01 DIAGNOSIS — R0789 Other chest pain: Secondary | ICD-10-CM | POA: Diagnosis not present

## 2023-05-01 DIAGNOSIS — I480 Paroxysmal atrial fibrillation: Secondary | ICD-10-CM | POA: Diagnosis not present

## 2023-05-01 DIAGNOSIS — E119 Type 2 diabetes mellitus without complications: Secondary | ICD-10-CM | POA: Diagnosis not present

## 2023-05-01 DIAGNOSIS — R600 Localized edema: Secondary | ICD-10-CM | POA: Diagnosis not present

## 2023-05-01 DIAGNOSIS — I10 Essential (primary) hypertension: Secondary | ICD-10-CM | POA: Diagnosis not present

## 2023-05-01 DIAGNOSIS — D72829 Elevated white blood cell count, unspecified: Secondary | ICD-10-CM | POA: Diagnosis not present

## 2023-05-01 DIAGNOSIS — Z6841 Body Mass Index (BMI) 40.0 and over, adult: Secondary | ICD-10-CM | POA: Diagnosis not present

## 2023-05-01 NOTE — Group Note (Deleted)

## 2023-05-03 DIAGNOSIS — I129 Hypertensive chronic kidney disease with stage 1 through stage 4 chronic kidney disease, or unspecified chronic kidney disease: Secondary | ICD-10-CM | POA: Diagnosis not present

## 2023-05-03 DIAGNOSIS — N2581 Secondary hyperparathyroidism of renal origin: Secondary | ICD-10-CM | POA: Diagnosis not present

## 2023-05-03 DIAGNOSIS — N189 Chronic kidney disease, unspecified: Secondary | ICD-10-CM | POA: Diagnosis not present

## 2023-05-03 DIAGNOSIS — R809 Proteinuria, unspecified: Secondary | ICD-10-CM | POA: Diagnosis not present

## 2023-05-03 DIAGNOSIS — E1122 Type 2 diabetes mellitus with diabetic chronic kidney disease: Secondary | ICD-10-CM | POA: Diagnosis not present

## 2023-05-03 DIAGNOSIS — N184 Chronic kidney disease, stage 4 (severe): Secondary | ICD-10-CM | POA: Diagnosis not present

## 2023-05-03 DIAGNOSIS — R600 Localized edema: Secondary | ICD-10-CM | POA: Diagnosis not present

## 2023-05-04 ENCOUNTER — Telehealth: Payer: PPO | Admitting: Infectious Diseases

## 2023-05-04 DIAGNOSIS — S81809D Unspecified open wound, unspecified lower leg, subsequent encounter: Secondary | ICD-10-CM | POA: Diagnosis not present

## 2023-05-04 DIAGNOSIS — N184 Chronic kidney disease, stage 4 (severe): Secondary | ICD-10-CM | POA: Diagnosis not present

## 2023-05-04 DIAGNOSIS — E118 Type 2 diabetes mellitus with unspecified complications: Secondary | ICD-10-CM | POA: Diagnosis not present

## 2023-05-04 DIAGNOSIS — R7881 Bacteremia: Secondary | ICD-10-CM | POA: Diagnosis not present

## 2023-05-06 DIAGNOSIS — R0902 Hypoxemia: Secondary | ICD-10-CM | POA: Diagnosis not present

## 2023-05-06 DIAGNOSIS — M4624 Osteomyelitis of vertebra, thoracic region: Secondary | ICD-10-CM | POA: Diagnosis not present

## 2023-05-08 DIAGNOSIS — I130 Hypertensive heart and chronic kidney disease with heart failure and stage 1 through stage 4 chronic kidney disease, or unspecified chronic kidney disease: Secondary | ICD-10-CM | POA: Diagnosis not present

## 2023-05-08 DIAGNOSIS — J440 Chronic obstructive pulmonary disease with acute lower respiratory infection: Secondary | ICD-10-CM | POA: Diagnosis not present

## 2023-05-08 DIAGNOSIS — Z6841 Body Mass Index (BMI) 40.0 and over, adult: Secondary | ICD-10-CM | POA: Diagnosis not present

## 2023-05-08 DIAGNOSIS — D72829 Elevated white blood cell count, unspecified: Secondary | ICD-10-CM | POA: Diagnosis not present

## 2023-05-08 DIAGNOSIS — I5023 Acute on chronic systolic (congestive) heart failure: Secondary | ICD-10-CM | POA: Diagnosis not present

## 2023-05-08 DIAGNOSIS — J189 Pneumonia, unspecified organism: Secondary | ICD-10-CM | POA: Diagnosis not present

## 2023-05-08 DIAGNOSIS — G4700 Insomnia, unspecified: Secondary | ICD-10-CM | POA: Diagnosis not present

## 2023-05-08 DIAGNOSIS — G4733 Obstructive sleep apnea (adult) (pediatric): Secondary | ICD-10-CM | POA: Diagnosis not present

## 2023-05-08 DIAGNOSIS — Z794 Long term (current) use of insulin: Secondary | ICD-10-CM | POA: Diagnosis not present

## 2023-05-08 DIAGNOSIS — N184 Chronic kidney disease, stage 4 (severe): Secondary | ICD-10-CM | POA: Diagnosis not present

## 2023-05-08 DIAGNOSIS — J4489 Other specified chronic obstructive pulmonary disease: Secondary | ICD-10-CM | POA: Diagnosis not present

## 2023-05-08 DIAGNOSIS — E1165 Type 2 diabetes mellitus with hyperglycemia: Secondary | ICD-10-CM | POA: Diagnosis not present

## 2023-05-08 DIAGNOSIS — Z452 Encounter for adjustment and management of vascular access device: Secondary | ICD-10-CM | POA: Diagnosis not present

## 2023-05-08 DIAGNOSIS — E871 Hypo-osmolality and hyponatremia: Secondary | ICD-10-CM | POA: Diagnosis not present

## 2023-05-08 DIAGNOSIS — L03119 Cellulitis of unspecified part of limb: Secondary | ICD-10-CM | POA: Diagnosis not present

## 2023-05-08 DIAGNOSIS — M1039 Gout due to renal impairment, multiple sites: Secondary | ICD-10-CM | POA: Diagnosis not present

## 2023-05-08 DIAGNOSIS — M4645 Discitis, unspecified, thoracolumbar region: Secondary | ICD-10-CM | POA: Diagnosis not present

## 2023-05-08 DIAGNOSIS — I480 Paroxysmal atrial fibrillation: Secondary | ICD-10-CM | POA: Diagnosis not present

## 2023-05-08 DIAGNOSIS — M4624 Osteomyelitis of vertebra, thoracic region: Secondary | ICD-10-CM | POA: Diagnosis not present

## 2023-05-08 DIAGNOSIS — Z7982 Long term (current) use of aspirin: Secondary | ICD-10-CM | POA: Diagnosis not present

## 2023-05-08 DIAGNOSIS — A419 Sepsis, unspecified organism: Secondary | ICD-10-CM | POA: Diagnosis not present

## 2023-05-08 DIAGNOSIS — E1122 Type 2 diabetes mellitus with diabetic chronic kidney disease: Secondary | ICD-10-CM | POA: Diagnosis not present

## 2023-05-08 DIAGNOSIS — K5901 Slow transit constipation: Secondary | ICD-10-CM | POA: Diagnosis not present

## 2023-05-09 DIAGNOSIS — M4620 Osteomyelitis of vertebra, site unspecified: Secondary | ICD-10-CM | POA: Diagnosis not present

## 2023-05-09 DIAGNOSIS — R0609 Other forms of dyspnea: Secondary | ICD-10-CM | POA: Diagnosis not present

## 2023-05-09 DIAGNOSIS — G4733 Obstructive sleep apnea (adult) (pediatric): Secondary | ICD-10-CM | POA: Diagnosis not present

## 2023-05-10 DIAGNOSIS — M4624 Osteomyelitis of vertebra, thoracic region: Secondary | ICD-10-CM | POA: Diagnosis not present

## 2023-05-10 DIAGNOSIS — A419 Sepsis, unspecified organism: Secondary | ICD-10-CM | POA: Diagnosis not present

## 2023-05-13 DIAGNOSIS — M4624 Osteomyelitis of vertebra, thoracic region: Secondary | ICD-10-CM | POA: Diagnosis not present

## 2023-05-16 ENCOUNTER — Inpatient Hospital Stay: Payer: PPO | Admitting: Infectious Diseases

## 2023-05-17 DIAGNOSIS — M4624 Osteomyelitis of vertebra, thoracic region: Secondary | ICD-10-CM | POA: Diagnosis not present

## 2023-05-17 DIAGNOSIS — N184 Chronic kidney disease, stage 4 (severe): Secondary | ICD-10-CM | POA: Diagnosis not present

## 2023-05-17 DIAGNOSIS — G8929 Other chronic pain: Secondary | ICD-10-CM | POA: Diagnosis not present

## 2023-05-17 DIAGNOSIS — I130 Hypertensive heart and chronic kidney disease with heart failure and stage 1 through stage 4 chronic kidney disease, or unspecified chronic kidney disease: Secondary | ICD-10-CM | POA: Diagnosis not present

## 2023-05-17 DIAGNOSIS — A419 Sepsis, unspecified organism: Secondary | ICD-10-CM | POA: Diagnosis not present

## 2023-05-17 DIAGNOSIS — L02212 Cutaneous abscess of back [any part, except buttock]: Secondary | ICD-10-CM | POA: Diagnosis not present

## 2023-05-17 DIAGNOSIS — R600 Localized edema: Secondary | ICD-10-CM | POA: Diagnosis not present

## 2023-05-17 DIAGNOSIS — E039 Hypothyroidism, unspecified: Secondary | ICD-10-CM | POA: Diagnosis not present

## 2023-05-17 DIAGNOSIS — M545 Low back pain, unspecified: Secondary | ICD-10-CM | POA: Diagnosis not present

## 2023-05-17 DIAGNOSIS — M1039 Gout due to renal impairment, multiple sites: Secondary | ICD-10-CM | POA: Diagnosis not present

## 2023-05-17 DIAGNOSIS — Z09 Encounter for follow-up examination after completed treatment for conditions other than malignant neoplasm: Secondary | ICD-10-CM | POA: Diagnosis not present

## 2023-05-17 DIAGNOSIS — Z794 Long term (current) use of insulin: Secondary | ICD-10-CM | POA: Diagnosis not present

## 2023-05-17 DIAGNOSIS — E1165 Type 2 diabetes mellitus with hyperglycemia: Secondary | ICD-10-CM | POA: Diagnosis not present

## 2023-05-17 DIAGNOSIS — E1122 Type 2 diabetes mellitus with diabetic chronic kidney disease: Secondary | ICD-10-CM | POA: Diagnosis not present

## 2023-05-17 DIAGNOSIS — M4644 Discitis, unspecified, thoracic region: Secondary | ICD-10-CM | POA: Diagnosis not present

## 2023-05-18 DIAGNOSIS — I5030 Unspecified diastolic (congestive) heart failure: Secondary | ICD-10-CM | POA: Diagnosis not present

## 2023-05-18 DIAGNOSIS — E1151 Type 2 diabetes mellitus with diabetic peripheral angiopathy without gangrene: Secondary | ICD-10-CM | POA: Diagnosis not present

## 2023-05-18 DIAGNOSIS — Z6841 Body Mass Index (BMI) 40.0 and over, adult: Secondary | ICD-10-CM | POA: Diagnosis not present

## 2023-05-18 DIAGNOSIS — I482 Chronic atrial fibrillation, unspecified: Secondary | ICD-10-CM | POA: Diagnosis not present

## 2023-05-18 DIAGNOSIS — M4624 Osteomyelitis of vertebra, thoracic region: Secondary | ICD-10-CM | POA: Diagnosis not present

## 2023-05-18 DIAGNOSIS — D692 Other nonthrombocytopenic purpura: Secondary | ICD-10-CM | POA: Diagnosis not present

## 2023-05-18 DIAGNOSIS — Z515 Encounter for palliative care: Secondary | ICD-10-CM | POA: Diagnosis not present

## 2023-05-18 DIAGNOSIS — J449 Chronic obstructive pulmonary disease, unspecified: Secondary | ICD-10-CM | POA: Diagnosis not present

## 2023-05-18 DIAGNOSIS — I110 Hypertensive heart disease with heart failure: Secondary | ICD-10-CM | POA: Diagnosis not present

## 2023-05-20 DIAGNOSIS — M4624 Osteomyelitis of vertebra, thoracic region: Secondary | ICD-10-CM | POA: Diagnosis not present

## 2023-05-23 ENCOUNTER — Telehealth: Payer: Self-pay

## 2023-05-23 ENCOUNTER — Encounter: Payer: Self-pay | Admitting: Infectious Diseases

## 2023-05-23 ENCOUNTER — Ambulatory Visit: Payer: PPO | Attending: Infectious Diseases | Admitting: Infectious Diseases

## 2023-05-23 ENCOUNTER — Other Ambulatory Visit: Payer: Self-pay

## 2023-05-23 ENCOUNTER — Inpatient Hospital Stay
Admission: EM | Admit: 2023-05-23 | Discharge: 2023-05-26 | DRG: 641 | Disposition: A | Discharge status: Home Health | Payer: PPO | Attending: Internal Medicine | Admitting: Internal Medicine

## 2023-05-23 ENCOUNTER — Other Ambulatory Visit
Admission: RE | Admit: 2023-05-23 | Discharge: 2023-05-23 | Disposition: A | Discharge status: Home / Self Care | Payer: PPO | Source: Ambulatory Visit | Attending: Infectious Diseases | Admitting: *Deleted

## 2023-05-23 ENCOUNTER — Encounter: Payer: Self-pay | Admitting: *Deleted

## 2023-05-23 VITALS — BP 154/84 | HR 73 | Temp 97.4°F

## 2023-05-23 DIAGNOSIS — E669 Obesity, unspecified: Secondary | ICD-10-CM | POA: Diagnosis not present

## 2023-05-23 DIAGNOSIS — M4644 Discitis, unspecified, thoracic region: Secondary | ICD-10-CM | POA: Insufficient documentation

## 2023-05-23 DIAGNOSIS — K219 Gastro-esophageal reflux disease without esophagitis: Secondary | ICD-10-CM | POA: Diagnosis present

## 2023-05-23 DIAGNOSIS — M4624 Osteomyelitis of vertebra, thoracic region: Secondary | ICD-10-CM | POA: Diagnosis not present

## 2023-05-23 DIAGNOSIS — B957 Other staphylococcus as the cause of diseases classified elsewhere: Secondary | ICD-10-CM | POA: Diagnosis present

## 2023-05-23 DIAGNOSIS — Z79890 Hormone replacement therapy (postmenopausal): Secondary | ICD-10-CM

## 2023-05-23 DIAGNOSIS — D72829 Elevated white blood cell count, unspecified: Secondary | ICD-10-CM | POA: Diagnosis present

## 2023-05-23 DIAGNOSIS — B9689 Other specified bacterial agents as the cause of diseases classified elsewhere: Secondary | ICD-10-CM

## 2023-05-23 DIAGNOSIS — I5022 Chronic systolic (congestive) heart failure: Secondary | ICD-10-CM | POA: Diagnosis not present

## 2023-05-23 DIAGNOSIS — Z23 Encounter for immunization: Secondary | ICD-10-CM

## 2023-05-23 DIAGNOSIS — J449 Chronic obstructive pulmonary disease, unspecified: Secondary | ICD-10-CM | POA: Insufficient documentation

## 2023-05-23 DIAGNOSIS — E7800 Pure hypercholesterolemia, unspecified: Secondary | ICD-10-CM | POA: Diagnosis present

## 2023-05-23 DIAGNOSIS — N189 Chronic kidney disease, unspecified: Secondary | ICD-10-CM | POA: Diagnosis not present

## 2023-05-23 DIAGNOSIS — D631 Anemia in chronic kidney disease: Secondary | ICD-10-CM | POA: Diagnosis not present

## 2023-05-23 DIAGNOSIS — N2581 Secondary hyperparathyroidism of renal origin: Secondary | ICD-10-CM | POA: Diagnosis present

## 2023-05-23 DIAGNOSIS — Z452 Encounter for adjustment and management of vascular access device: Secondary | ICD-10-CM | POA: Diagnosis not present

## 2023-05-23 DIAGNOSIS — M4640 Discitis, unspecified, site unspecified: Secondary | ICD-10-CM | POA: Diagnosis not present

## 2023-05-23 DIAGNOSIS — I480 Paroxysmal atrial fibrillation: Secondary | ICD-10-CM | POA: Diagnosis present

## 2023-05-23 DIAGNOSIS — I130 Hypertensive heart and chronic kidney disease with heart failure and stage 1 through stage 4 chronic kidney disease, or unspecified chronic kidney disease: Secondary | ICD-10-CM | POA: Diagnosis not present

## 2023-05-23 DIAGNOSIS — R600 Localized edema: Secondary | ICD-10-CM | POA: Diagnosis not present

## 2023-05-23 DIAGNOSIS — Z7951 Long term (current) use of inhaled steroids: Secondary | ICD-10-CM

## 2023-05-23 DIAGNOSIS — E1122 Type 2 diabetes mellitus with diabetic chronic kidney disease: Secondary | ICD-10-CM | POA: Insufficient documentation

## 2023-05-23 DIAGNOSIS — Z79899 Other long term (current) drug therapy: Secondary | ICD-10-CM

## 2023-05-23 DIAGNOSIS — K5900 Constipation, unspecified: Secondary | ICD-10-CM | POA: Diagnosis present

## 2023-05-23 DIAGNOSIS — N179 Acute kidney failure, unspecified: Secondary | ICD-10-CM | POA: Diagnosis not present

## 2023-05-23 DIAGNOSIS — E1169 Type 2 diabetes mellitus with other specified complication: Secondary | ICD-10-CM | POA: Diagnosis not present

## 2023-05-23 DIAGNOSIS — R7989 Other specified abnormal findings of blood chemistry: Secondary | ICD-10-CM | POA: Insufficient documentation

## 2023-05-23 DIAGNOSIS — E871 Hypo-osmolality and hyponatremia: Secondary | ICD-10-CM | POA: Diagnosis not present

## 2023-05-23 DIAGNOSIS — Z7982 Long term (current) use of aspirin: Secondary | ICD-10-CM

## 2023-05-23 DIAGNOSIS — I10 Essential (primary) hypertension: Secondary | ICD-10-CM | POA: Diagnosis present

## 2023-05-23 DIAGNOSIS — R531 Weakness: Secondary | ICD-10-CM | POA: Diagnosis not present

## 2023-05-23 DIAGNOSIS — Z823 Family history of stroke: Secondary | ICD-10-CM

## 2023-05-23 DIAGNOSIS — R7881 Bacteremia: Secondary | ICD-10-CM | POA: Insufficient documentation

## 2023-05-23 DIAGNOSIS — G4733 Obstructive sleep apnea (adult) (pediatric): Secondary | ICD-10-CM | POA: Diagnosis present

## 2023-05-23 DIAGNOSIS — Z6841 Body Mass Index (BMI) 40.0 and over, adult: Secondary | ICD-10-CM

## 2023-05-23 DIAGNOSIS — E66813 Obesity, class 3: Secondary | ICD-10-CM | POA: Diagnosis present

## 2023-05-23 DIAGNOSIS — Z1152 Encounter for screening for COVID-19: Secondary | ICD-10-CM

## 2023-05-23 DIAGNOSIS — Z91041 Radiographic dye allergy status: Secondary | ICD-10-CM

## 2023-05-23 DIAGNOSIS — M6282 Rhabdomyolysis: Secondary | ICD-10-CM | POA: Diagnosis present

## 2023-05-23 DIAGNOSIS — Z888 Allergy status to other drugs, medicaments and biological substances status: Secondary | ICD-10-CM

## 2023-05-23 DIAGNOSIS — Z7901 Long term (current) use of anticoagulants: Secondary | ICD-10-CM

## 2023-05-23 DIAGNOSIS — I509 Heart failure, unspecified: Secondary | ICD-10-CM

## 2023-05-23 DIAGNOSIS — K30 Functional dyspepsia: Secondary | ICD-10-CM | POA: Diagnosis present

## 2023-05-23 DIAGNOSIS — I129 Hypertensive chronic kidney disease with stage 1 through stage 4 chronic kidney disease, or unspecified chronic kidney disease: Secondary | ICD-10-CM | POA: Diagnosis present

## 2023-05-23 DIAGNOSIS — Z8249 Family history of ischemic heart disease and other diseases of the circulatory system: Secondary | ICD-10-CM

## 2023-05-23 DIAGNOSIS — R112 Nausea with vomiting, unspecified: Secondary | ICD-10-CM | POA: Diagnosis present

## 2023-05-23 DIAGNOSIS — E119 Type 2 diabetes mellitus without complications: Secondary | ICD-10-CM

## 2023-05-23 DIAGNOSIS — N184 Chronic kidney disease, stage 4 (severe): Secondary | ICD-10-CM | POA: Diagnosis present

## 2023-05-23 DIAGNOSIS — T368X5A Adverse effect of other systemic antibiotics, initial encounter: Secondary | ICD-10-CM | POA: Diagnosis present

## 2023-05-23 DIAGNOSIS — Z7984 Long term (current) use of oral hypoglycemic drugs: Secondary | ICD-10-CM

## 2023-05-23 LAB — COMPREHENSIVE METABOLIC PANEL
ALT: 89 U/L — ABNORMAL HIGH (ref 0–44)
AST: 192 U/L — ABNORMAL HIGH (ref 15–41)
Albumin: 3.8 g/dL (ref 3.5–5.0)
Alkaline Phosphatase: 120 U/L (ref 38–126)
Anion gap: 14 (ref 5–15)
BUN: 94 mg/dL — ABNORMAL HIGH (ref 8–23)
CO2: 28 mmol/L (ref 22–32)
Calcium: 14.2 mg/dL (ref 8.9–10.3)
Chloride: 88 mmol/L — ABNORMAL LOW (ref 98–111)
Creatinine, Ser: 3.32 mg/dL — ABNORMAL HIGH (ref 0.61–1.24)
GFR, Estimated: 19 mL/min — ABNORMAL LOW (ref >60)
Glucose, Bld: 178 mg/dL — ABNORMAL HIGH (ref 70–99)
Potassium: 3.7 mmol/L (ref 3.5–5.1)
Sodium: 130 mmol/L — ABNORMAL LOW (ref 135–145)
Total Bilirubin: 0.6 mg/dL (ref 0.3–1.2)
Total Protein: 8 g/dL (ref 6.5–8.1)

## 2023-05-23 LAB — CBC WITH DIFFERENTIAL/PLATELET
Abs Immature Granulocytes: 0.12 10*3/uL — ABNORMAL HIGH (ref 0.00–0.07)
Basophils Absolute: 0.1 10*3/uL (ref 0.0–0.1)
Basophils Relative: 1 %
Eosinophils Absolute: 0.5 10*3/uL (ref 0.0–0.5)
Eosinophils Relative: 3 %
HCT: 35.4 % — ABNORMAL LOW (ref 39.0–52.0)
Hemoglobin: 11.6 g/dL — ABNORMAL LOW (ref 13.0–17.0)
Immature Granulocytes: 1 %
Lymphocytes Relative: 14 %
Lymphs Abs: 2.2 10*3/uL (ref 0.7–4.0)
MCH: 30.5 pg (ref 26.0–34.0)
MCHC: 32.8 g/dL (ref 30.0–36.0)
MCV: 93.2 fL (ref 80.0–100.0)
Monocytes Absolute: 1.2 10*3/uL — ABNORMAL HIGH (ref 0.1–1.0)
Monocytes Relative: 8 %
Neutro Abs: 11 10*3/uL — ABNORMAL HIGH (ref 1.7–7.7)
Neutrophils Relative %: 73 %
Platelets: 285 10*3/uL (ref 150–400)
RBC: 3.8 MIL/uL — ABNORMAL LOW (ref 4.22–5.81)
RDW: 14.4 % (ref 11.5–15.5)
WBC: 15.1 10*3/uL — ABNORMAL HIGH (ref 4.0–10.5)
nRBC: 0 % (ref 0.0–0.2)

## 2023-05-23 LAB — SEDIMENTATION RATE: Sed Rate: 74 mm/h — ABNORMAL HIGH (ref 0–20)

## 2023-05-23 LAB — C-REACTIVE PROTEIN: CRP: 0.6 mg/dL (ref <1.0)

## 2023-05-23 LAB — CK: Total CK: 1533 U/L — ABNORMAL HIGH (ref 49–397)

## 2023-05-23 LAB — GLUCOSE, CAPILLARY: Glucose-Capillary: 139 mg/dL — ABNORMAL HIGH (ref 70–99)

## 2023-05-23 MED ORDER — AMIODARONE HCL 200 MG PO TABS
200.0000 mg | ORAL_TABLET | Freq: Every day | ORAL | Status: DC
  Administered 2023-05-24 – 2023-05-26 (×3): 200 mg via ORAL
  Filled 2023-05-23 (×3): qty 1

## 2023-05-23 MED ORDER — ALLOPURINOL 100 MG PO TABS
100.0000 mg | ORAL_TABLET | Freq: Every day | ORAL | Status: DC
  Administered 2023-05-24 – 2023-05-26 (×3): 100 mg via ORAL
  Filled 2023-05-23 (×3): qty 1

## 2023-05-23 MED ORDER — INSULIN ASPART 100 UNIT/ML IJ SOLN
0.0000 [IU] | Freq: Three times a day (TID) | INTRAMUSCULAR | Status: DC
  Administered 2023-05-24 (×2): 1 [IU] via SUBCUTANEOUS
  Administered 2023-05-24 – 2023-05-25 (×3): 2 [IU] via SUBCUTANEOUS
  Administered 2023-05-25: 1 [IU] via SUBCUTANEOUS
  Administered 2023-05-26: 2 [IU] via SUBCUTANEOUS
  Administered 2023-05-26: 3 [IU] via SUBCUTANEOUS
  Filled 2023-05-23 (×8): qty 1

## 2023-05-23 MED ORDER — CALCITONIN (SALMON) 200 UNIT/ML IJ SOLN
400.0000 [IU] | INTRAMUSCULAR | Status: AC
  Administered 2023-05-23: 400 [IU] via SUBCUTANEOUS
  Filled 2023-05-23 (×4): qty 2

## 2023-05-23 MED ORDER — CEFTRIAXONE IV (FOR PTA / DISCHARGE USE ONLY): 2.0000 g | INTRAVENOUS | Status: DC

## 2023-05-23 MED ORDER — LEVOTHYROXINE SODIUM 112 MCG PO TABS
112.0000 ug | ORAL_TABLET | Freq: Every day | ORAL | Status: DC
  Administered 2023-05-24 – 2023-05-26 (×3): 112 ug via ORAL
  Filled 2023-05-23 (×3): qty 1

## 2023-05-23 MED ORDER — DOXAZOSIN MESYLATE 4 MG PO TABS
4.0000 mg | ORAL_TABLET | Freq: Every day | ORAL | Status: DC
  Administered 2023-05-24 – 2023-05-25 (×3): 4 mg via ORAL
  Filled 2023-05-23 (×4): qty 1

## 2023-05-23 MED ORDER — FUROSEMIDE 10 MG/ML IJ SOLN
20.0000 mg | Freq: Once | INTRAMUSCULAR | Status: AC
  Administered 2023-05-23: 20 mg via INTRAVENOUS
  Filled 2023-05-23: qty 4

## 2023-05-23 MED ORDER — NITROGLYCERIN 0.4 MG SL SUBL: 0.4000 mg | SUBLINGUAL_TABLET | SUBLINGUAL | Status: DC | PRN

## 2023-05-23 MED ORDER — ACETAMINOPHEN 325 MG PO TABS
650.0000 mg | ORAL_TABLET | Freq: Four times a day (QID) | ORAL | Status: DC | PRN
  Administered 2023-05-24 – 2023-05-25 (×3): 650 mg via ORAL
  Filled 2023-05-23 (×3): qty 2

## 2023-05-23 MED ORDER — CARVEDILOL 6.25 MG PO TABS
6.2500 mg | ORAL_TABLET | Freq: Two times a day (BID) | ORAL | Status: DC
  Administered 2023-05-24: 6.25 mg via ORAL
  Filled 2023-05-23: qty 1

## 2023-05-23 MED ORDER — SENNOSIDES-DOCUSATE SODIUM 8.6-50 MG PO TABS
1.0000 | ORAL_TABLET | Freq: Every evening | ORAL | Status: DC | PRN
  Administered 2023-05-24 – 2023-05-25 (×2): 1 via ORAL
  Filled 2023-05-23 (×2): qty 1

## 2023-05-23 MED ORDER — ASPIRIN 81 MG PO TBEC
81.0000 mg | DELAYED_RELEASE_TABLET | Freq: Every day | ORAL | Status: DC
  Administered 2023-05-24 – 2023-05-26 (×3): 81 mg via ORAL
  Filled 2023-05-23 (×3): qty 1

## 2023-05-23 MED ORDER — HEPARIN SODIUM (PORCINE) 5000 UNIT/ML IJ SOLN
5000.0000 [IU] | Freq: Three times a day (TID) | INTRAMUSCULAR | Status: DC
  Administered 2023-05-23 – 2023-05-26 (×8): 5000 [IU] via SUBCUTANEOUS
  Filled 2023-05-23 (×9): qty 1

## 2023-05-23 MED ORDER — PANTOPRAZOLE SODIUM 40 MG PO TBEC
40.0000 mg | DELAYED_RELEASE_TABLET | Freq: Every day | ORAL | Status: DC
  Administered 2023-05-24: 40 mg via ORAL
  Filled 2023-05-23: qty 1

## 2023-05-23 MED ORDER — VITAMIN D3 25 MCG (1000 UNIT) PO TABS: 2000.0000 [IU] | ORAL_TABLET | Freq: Every day | ORAL | Status: DC

## 2023-05-23 MED ORDER — OXYCODONE HCL 5 MG PO TABS
5.0000 mg | ORAL_TABLET | Freq: Once | ORAL | Status: AC
  Administered 2023-05-23: 5 mg via ORAL
  Filled 2023-05-23: qty 1

## 2023-05-23 MED ORDER — IPRATROPIUM-ALBUTEROL 0.5-2.5 (3) MG/3ML IN SOLN
3.0000 mL | Freq: Three times a day (TID) | RESPIRATORY_TRACT | Status: AC
  Administered 2023-05-23 – 2023-05-24 (×2): 3 mL via RESPIRATORY_TRACT
  Filled 2023-05-23 (×2): qty 3

## 2023-05-23 MED ORDER — FENTANYL CITRATE PF 50 MCG/ML IJ SOSY: 12.5000 ug | PREFILLED_SYRINGE | INTRAMUSCULAR | Status: DC | PRN

## 2023-05-23 MED ORDER — ISOSORBIDE MONONITRATE ER 30 MG PO TB24
30.0000 mg | ORAL_TABLET | Freq: Every day | ORAL | Status: DC
  Administered 2023-05-24 – 2023-05-26 (×3): 30 mg via ORAL
  Filled 2023-05-23 (×3): qty 1

## 2023-05-23 MED ORDER — DIPHENHYDRAMINE HCL 25 MG PO CAPS
25.0000 mg | ORAL_CAPSULE | Freq: Four times a day (QID) | ORAL | Status: DC | PRN
  Administered 2023-05-24 – 2023-05-25 (×2): 25 mg via ORAL
  Filled 2023-05-23 (×2): qty 1

## 2023-05-23 MED ORDER — MOMETASONE FURO-FORMOTEROL FUM 200-5 MCG/ACT IN AERO
2.0000 | INHALATION_SPRAY | Freq: Two times a day (BID) | RESPIRATORY_TRACT | Status: DC
  Administered 2023-05-24 – 2023-05-26 (×5): 2 via RESPIRATORY_TRACT
  Filled 2023-05-23 (×2): qty 8.8

## 2023-05-23 MED ORDER — TORSEMIDE 20 MG PO TABS
40.0000 mg | ORAL_TABLET | Freq: Every day | ORAL | Status: DC
  Administered 2023-05-24: 40 mg via ORAL
  Filled 2023-05-23: qty 2

## 2023-05-23 MED ORDER — ACETAMINOPHEN 650 MG RE SUPP: 650.0000 mg | Freq: Four times a day (QID) | RECTAL | Status: DC | PRN

## 2023-05-23 MED ORDER — INSULIN ASPART 100 UNIT/ML IJ SOLN
0.0000 [IU] | Freq: Every day | INTRAMUSCULAR | Status: DC
  Administered 2023-05-25: 2 [IU] via SUBCUTANEOUS
  Filled 2023-05-23: qty 1

## 2023-05-23 MED ORDER — SODIUM CHLORIDE 0.9 % IV BOLUS
500.0000 mL | Freq: Once | INTRAVENOUS | Status: AC
  Administered 2023-05-23: 500 mL via INTRAVENOUS

## 2023-05-23 MED ORDER — LORATADINE 10 MG PO TABS
10.0000 mg | ORAL_TABLET | Freq: Every day | ORAL | Status: DC
  Administered 2023-05-24 – 2023-05-26 (×3): 10 mg via ORAL
  Filled 2023-05-23 (×3): qty 1

## 2023-05-23 MED ORDER — CALCITRIOL 0.25 MCG PO CAPS
0.2500 ug | ORAL_CAPSULE | Freq: Every day | ORAL | Status: DC
  Filled 2023-05-23: qty 1

## 2023-05-23 MED ORDER — ONDANSETRON HCL 4 MG/2ML IJ SOLN
4.0000 mg | Freq: Four times a day (QID) | INTRAMUSCULAR | Status: DC | PRN
  Administered 2023-05-25: 4 mg via INTRAVENOUS
  Filled 2023-05-23: qty 2

## 2023-05-23 MED ORDER — IPRATROPIUM BROMIDE 0.02 % IN SOLN
0.5000 mg | Freq: Four times a day (QID) | RESPIRATORY_TRACT | Status: DC | PRN
  Administered 2023-05-24: 0.5 mg via RESPIRATORY_TRACT
  Filled 2023-05-23: qty 2.5

## 2023-05-23 MED ORDER — SPIRONOLACTONE 12.5 MG HALF TABLET
12.5000 mg | ORAL_TABLET | Freq: Every day | ORAL | Status: DC
  Administered 2023-05-24: 12.5 mg via ORAL
  Filled 2023-05-23: qty 1

## 2023-05-23 MED ORDER — ONDANSETRON HCL 4 MG PO TABS: 4.0000 mg | ORAL_TABLET | Freq: Four times a day (QID) | ORAL | Status: DC | PRN

## 2023-05-23 MED ORDER — MORPHINE SULFATE (PF) 2 MG/ML IV SOLN
2.0000 mg | Freq: Four times a day (QID) | INTRAVENOUS | Status: AC | PRN
  Administered 2023-05-23 – 2023-05-24 (×2): 2 mg via INTRAVENOUS
  Filled 2023-05-23 (×2): qty 1

## 2023-05-23 MED ORDER — PNEUMOCOCCAL 20-VAL CONJ VACC 0.5 ML IM SUSY
0.5000 mL | PREFILLED_SYRINGE | INTRAMUSCULAR | Status: DC
  Filled 2023-05-23: qty 0.5

## 2023-05-23 MED ORDER — SODIUM CHLORIDE 0.9 % IV SOLN
2.0000 g | Freq: Every day | INTRAVENOUS | Status: DC
  Administered 2023-05-24 – 2023-05-25 (×3): 2 g via INTRAVENOUS
  Filled 2023-05-23 (×4): qty 20

## 2023-05-23 NOTE — Assessment & Plan Note (Signed)
Resumed home amiodarone 200 mg daily for rhythm management and Coreg 6.25 mg p.o. twice daily with meals for rate control

## 2023-05-23 NOTE — Assessment & Plan Note (Signed)
Dr. Cherylann Ratel, nephrology has been consulted and per EDP recommended calcitonin 400 mg subcutaneous one-time dose Check BMP in a.m.

## 2023-05-23 NOTE — ED Provider Notes (Signed)
[facility] Provider Note    Event Date/Time   First MD Initiated Contact with Patient 05/23/23 1718     (approximate)   History   abnormal labs   HPI  Jason Hudson is a 72 y.o. male with a history of discitis, chronic renal disease Multiple other chronic medical illnesses noted   According to note by Dr. Joylene Draft patient with a history of discitis treated with daptomycin and Rocephin  Reports feeling of muscular weakness wobbly legs.  Slight itching.  Symptoms have been present for about a week of feeling increased fatigue in his legs a little bit "wobbly" when he walks.  Also feels fatigued and all of his extremities  Physical Exam   Triage Vital Signs: ED Triage Vitals  Encounter Vitals Group     BP 05/23/23 1643 129/60     Systolic BP Percentile --      Diastolic BP Percentile --      Pulse Rate 05/23/23 1643 75     Resp 05/23/23 1643 20     Temp 05/23/23 1643 98.1 F (36.7 C)     Temp Source 05/23/23 1643 Oral     SpO2 05/23/23 1643 92 %     Weight 05/23/23 1640 (!) 303 lb (137.4 kg)     Height 05/23/23 1640 5\' 9"  (1.753 m)     Head Circumference --      [facility] --      Pain Score 05/23/23 1640 8     Pain Loc --      Pain Education --      Exclude from Growth Chart --     Most recent vital signs: Vitals:   05/23/23 1643  BP: 129/60  Pulse: 75  Resp: 20  Temp: 98.1 F (36.7 C)  SpO2: 92%     General: Awake, no distress.  Fully oriented CV:  Good peripheral perfusion.  PICC line right upper chest Resp:  Normal effort.  Abd:  No distention.  Other:  Demonstrates 5 out of 5 strength all extremities against gravity but does report he feels fatigued and tired.  He does advise he still able to walk and drove himself to the hospital   ED Results / Procedures / Treatments   Labs (all labs ordered are listed, but only abnormal results are displayed) Labs Reviewed  MULTIPLE MYELOMA PANEL, SERUM     Labs from  earlier today reviewed.  Notable for creatinine of 3.3 which appears to approximate the patient's baseline.  Also noted though is a calcium of 14.2.  Critically high.  Mild hyponatremia chronic mild transaminitis  CK 1533  EKG  Interpreted by me at 1650 heart rate 75 QRS 100 QTc 360 Normal sinus rhythm, no evidence of acute ischemia.   RADIOLOGY     PROCEDURES:  Critical Care performed: Yes, see critical care procedure note(s)  CRITICAL CARE Performed by: Sharyn Creamer   Total critical care time: 30  minutes  Critical care time was exclusive of separately billable procedures and treating other patients.  Critical care was necessary to treat or prevent imminent or life-threatening deterioration.  Critical care was time spent personally by me on the following activities: development of treatment plan with patient and/or surrogate as well as nursing, discussions with consultants, evaluation of patient's response to treatment, examination of patient, obtaining history from patient or surrogate, ordering and performing treatments and interventions, ordering and review of laboratory studies, ordering and review of radiographic studies, pulse oximetry  and re-evaluation of patient's condition.   Procedures   MEDICATIONS ORDERED IN ED: Medications  calcitonin (MIACALCIN) injection 400 Units (has no administration in time range)  sodium chloride 0.9 % bolus 500 mL (500 mLs Intravenous New Bag/Given 05/23/23 1757)  furosemide (LASIX) injection 20 mg (20 mg Intravenous Given 05/23/23 1757)     IMPRESSION / MDM / ASSESSMENT AND PLAN / ED COURSE  I reviewed the triage vital signs and the nursing notes.                              Differential diagnosis includes, but is not limited to, hypercalcemia, etiology not yet certain but certainly further workup required, rhabdomyolysis, ongoing discitis, etc.  Differential diagnosis for hypercalcemia somewhat broad.  I consulted with Dr. Cherylann Ratel,  and treatment with Lasix, IV fluids, and calcitonin is recommended by him and orders placed as per his recommendations.  Nephrology to provide formal consult  Patient is awake alert hemodynamically stable but with severely elevated calcium.  Discussed with patient need for admission to the hospital and further workup anticipated through the internal medicine team in consultation with nephrology.  Patient's presentation is most consistent with acute presentation with potential threat to life or bodily function.  Patient is medically stable for medical admission   The patient is on the cardiac monitor to evaluate for evidence of arrhythmia and/or significant heart rate changes.  Consulted with patient accepted to hospital service by Dr. Sedalia Muta      FINAL CLINICAL IMPRESSION(S) / ED DIAGNOSES   Final diagnoses:  Hypercalcemia  Non-traumatic rhabdomyolysis     Rx / DC Orders   ED Discharge Orders     None        Note:  This document was prepared using Dragon voice recognition software and may include unintentional dictation errors.   Sharyn Creamer, MD 05/23/23 [phone]

## 2023-05-23 NOTE — Hospital Course (Addendum)
Mr. Jason Hudson is a 72 year old male with history of staph lugdunensis bacteremia and thoracic discitis, COPD, paroxysmal atrial fibrillation, who presents to the emergency department from ID clinic for chief concerns of acute kidney injury in setting of daptomycin use.  Vitals in the ED showed temperature 98.1, respiration rate of 20, heart rate of 74, blood pressure 129/60, SpO2 92% on room air.  Serum sodium is 130, potassium 3.7, chloride 88, bicarb 28, BUN 9 4, serum creatinine of 3.32, EGFR of 19, nonfasting blood glucose 178, WBC 15.1, hemoglobin 11.6, platelets of 285.  Serum calcium level is 14.2.  ED treatment: Furosemide 20 mg IV one-time dose, calcitonin 400 units once, sodium chloride 500 mL bolus.  EDP consulted with nephrology, Dr. Cherylann Ratel who states the will be seen.

## 2023-05-23 NOTE — H&P (Signed)
History and Physical   Jason Hudson ION:[id number] DOB: 12-10-1950 DOA: 05/23/2023  PCP: Enid Baas, MD  Outpatient Specialists: Dr. Rivka Safer, infectious disease specialist Patient coming from: Home  I have personally briefly reviewed patient's old medical records in [facility] EMR.  Chief Concern: Hypocalcemia  HPI: Jason Hudson is a 72 year old male with history of staph lugdunensis bacteremia and thoracic discitis, COPD, paroxysmal atrial fibrillation, who presents to the emergency department from ID clinic for chief concerns of acute kidney injury in setting of daptomycin use.  Vitals in the ED showed temperature 98.1, respiration rate of 20, heart rate of 74, blood pressure 129/60, SpO2 92% on room air.  Serum sodium is 130, potassium 3.7, chloride 88, bicarb 28, BUN 9 4, serum creatinine of 3.32, EGFR of 19, nonfasting blood glucose 178, WBC 15.1, hemoglobin 11.6, platelets of 285.  Serum calcium level is 14.2.  ED treatment: Furosemide 20 mg IV one-time dose, calcitonin 400 units once, sodium chloride 500 mL bolus.  EDP consulted with nephrology, Dr. Cherylann Ratel who states the will be seen. ------------------------------------ At bedside, he is able to tell me his name, age, location, current calendar year. He reports baseline crhonic pain, and it got worse 7 days ago. At baseline he can walk unassisted but last Wednesday can can not even walk.  He endorses constipation, worsening in the last few weeks. He reports some baseline constipation for 3 years. He endorses generalized muscle ache.   He endorses chills and denies fever. He endorses nausea, vomiting. He denies trauma and known sick contact. He denies changes to urinary habits. He denies dysuria, hematuria, blood in his stool.   He denies cough.   Social history: He lives at home with his girlfriend. He denies tobacco, etoh, and recreational drug use. He is retired and formerly was in  [employer].  ROS: Constitutional: no weight change, no fever ENT/Mouth: no sore throat, no rhinorrhea Eyes: no eye pain, no vision changes Cardiovascular: no chest pain, no dyspnea,  + baseline edema, no palpitations Respiratory: no cough, no sputum, no wheezing Gastrointestinal: no nausea, no vomiting, no diarrhea, + constipation Genitourinary: no urinary incontinence, no dysuria, no hematuria Musculoskeletal: no arthralgias, no myalgias Skin: no skin lesions, no pruritus, Neuro: + weakness, no loss of consciousness, no syncope Psych: no anxiety, no depression, + decrease appetite Heme/Lymph: no bruising, no bleeding  ED Course: Discussed with emergency medicine provider, patient requiring hospitalization for chief concerns of hypocalcemia.  Assessment/Plan  Principal Problem:   Hypocalcemia Active Problems:   Leukocytosis   Essential hypertension   Paroxysmal atrial fibrillation (HCC)   Obesity, Class III, BMI 40-49.9 (morbid obesity) (HCC)   Obstructive sleep apnea of adult   Acute exacerbation of CHF (congestive heart failure) (HCC)   AKI (acute kidney injury) (HCC)   Hypercholesterolemia   Hyperlipidemia due to type 2 diabetes mellitus (HCC)   Benign hypertensive kidney disease with chronic kidney disease   Type 2 diabetes mellitus (HCC)   Elevated LFTs   Bacteremia   Assessment and Plan:  * Hypocalcemia Dr. Cherylann Ratel, nephrology has been consulted and per EDP recommended calcitonin 400 mg subcutaneous one-time dose Check BMP in a.m.  Leukocytosis Persistent over the last month  Treat per above  Paroxysmal atrial fibrillation (HCC) Resumed home amiodarone 200 mg daily for rhythm management and Coreg 6.25 mg p.o. twice daily with meals for rate control  Obesity, Class III, BMI 40-49.9 (morbid obesity) (HCC) This complicates overall care and prognosis.   Bacteremia Staph lugdunensis  bacteremia with T5-T6 discitis and osteomyelitis.  Epic order placed to  infectious disease consultation for change in medication as patient will no longer be on daptomycin  Type 2 diabetes mellitus (HCC) Home glipizide not resumed on admission Insulin SSI with at bedtime coverage, renal dosing ordered Goal inpatient blood glucose levels 140-180  AKI (acute kidney injury) [facility]) Nephrology, Dr. Cherylann Ratel has been consulted  Home meds resumed to the best of my ability  Pending med reconciliation completion  Chart reviewed.   DVT prophylaxis: Heparin 5000 units subcutaneous every 8 hours Code Status: Full code Diet: Renal/carb modified Family Communication: updated girlfriend at bedside, with patient's permission, [place] [employer]  Disposition Plan: Pending clinical course Consults called: Nephrology service Admission status: Telemetry medical, inpatient  Past Medical History:  Diagnosis Date   CHF (congestive heart failure) (HCC)    COPD (chronic obstructive pulmonary disease) (HCC)    Diabetes mellitus without complication (HCC)    Edema    leg swelling   Gastroesophageal reflux disease 12/15/2017   Gout    Hypertension    Melena 11/05/2017   Pneumonia 07/27/2016   Past Surgical History:  Procedure Laterality Date   CENTRAL LINE INSERTION-TUNNELED N/A 04/21/2023   Procedure: CENTRAL LINE INSERTION-TUNNELED;  Surgeon: Gilda Crease Latina Craver, MD;  Location: [facility] INVASIVE CV LAB;  Service: Cardiovascular;  Laterality: N/A;   COLONOSCOPY WITH PROPOFOL N/A 11/07/2017   Procedure: COLONOSCOPY WITH PROPOFOL;  Surgeon: Wyline Mood, MD;  Location: [facility] ENDOSCOPY;  Service: Gastroenterology;  Laterality: N/A;   ESOPHAGOGASTRODUODENOSCOPY (EGD) WITH PROPOFOL N/A 11/07/2017   Procedure: ESOPHAGOGASTRODUODENOSCOPY (EGD) WITH PROPOFOL;  Surgeon: Wyline Mood, MD;  Location: [facility] ENDOSCOPY;  Service: Gastroenterology;  Laterality: N/A;   LEFT HEART CATH AND CORONARY ANGIOGRAPHY N/A 10/23/2017   Procedure: LEFT HEART CATH AND CORONARY ANGIOGRAPHY;  Surgeon: Marcina Millard, MD;  Location: [facility] INVASIVE CV LAB;  Service: Cardiovascular;  Laterality: N/A;   Social History:  reports that he has never smoked. He has never used smokeless tobacco. He reports that he does not currently use alcohol. He reports that he does not use drugs.  Allergies  Allergen Reactions   Atorvastatin Other (See Comments)    Other reaction(s): Not available Other reaction(s): Other (See Comments)    Red Dye #40 (Allura Red) Other (See Comments)    "not as allergic to it as I used to be. I can take a little of it"   Family History  Problem Relation Age of Onset   Cancer Mother    CVA Father    Heart disease Father    Family history: Family history reviewed and not pertinent.  Prior to Admission medications   Medication Sig Start Date End Date Taking? Authorizing Provider  albuterol (VENTOLIN HFA) 108 (90 Base) MCG/ACT inhaler Inhale 1-2 puffs into the lungs every 6 (six) hours as needed for wheezing or shortness of breath. 01/03/23   Chesley Noon, MD  allopurinol (ZYLOPRIM) 100 MG tablet TAKE ONE TABLET (100 MG) BY MOUTH ONCE DAILY 09/09/22   Sallyanne Kuster, NP  amiodarone (PACERONE) 200 MG tablet Take 1 tablet (200 mg total) by mouth daily. 04/21/23 05/21/23  Darlin Drop, DO  aspirin EC 81 MG tablet Take 81 mg by mouth daily. Swallow whole.    [provider]  budesonide-formoterol (SYMBICORT) 160-4.5 MCG/ACT inhaler Inhale 2 puffs into the lungs 2 (two) times daily. 01/30/23 01/30/24  [provider]  calcitRIOL (ROCALTROL) 0.25 MCG capsule Take 0.25 mcg by mouth daily. 03/01/23   [provider]  carvedilol (COREG) 6.25 MG tablet Take 6.25 mg by mouth 2 (two) times daily with a meal. Pt gets from cardiologist    [provider]  cefTRIAXone (ROCEPHIN) IVPB Inject 2 g into the vein daily. Indication:  Vertebral osteomyelitis First Dose: Yes Last Day of Therapy:  05/30/23 Labs - Once weekly: CBC with differential, CK, CMP, CRP,  ESR Monitor for rhabomyolysis or eosinophilia Fax weekly lab results  promptly to [phone]  Please leave central line  place until doctor ( vascular surgeon) has seen patient and will remove the cath Method of administration: IV Push Method of administration may be changed at the discretion of home infusion pharmacist based upon assessment of the patient and/or caregiver's ability to self-administer the medication ordered. 04/21/23 05/30/23  Darlin Drop, DO  Cholecalciferol (VITAMIN D3) 50 MCG (2000 UT) TABS Take 2,000 Units by mouth at bedtime. 09/18/20   Theotis Burrow, NP  daptomycin (CUBICIN) IVPB Inject 800 mg into the vein every other day. Indication:  Vertebral osteomyelitis First Dose: Yes Last Day of Therapy:  05/30/23 Labs - Once weekly:  CBC with differential, CK, CMP, CRP, ESR Monitor for rhabomyolysis or eosinophilia Fax weekly lab results  promptly to [phone]  Please leave central line  place until doctor ( vascular surgeon) has seen patient and will remove the cath Method of administration: IV Push Method of administration may be changed at the discretion of home infusion pharmacist based upon assessment of the patient and/or caregiver's ability to self-administer the medication ordered. 04/21/23 05/31/23  Darlin Drop, DO  diphenhydrAMINE (BENADRYL) 25 mg capsule Take 25 mg by mouth every 6 (six) hours as needed for itching or allergies.    [provider]  doxazosin (CARDURA) 4 MG tablet TAKE 1 TABLET BY MOUTH AT BEDTIME 11/22/22   Abernathy, Arlyss Repress, NP  glipiZIDE (GLUCOTROL XL) 5 MG 24 hr tablet Take 5 mg by mouth daily with breakfast.    [provider]  ipratropium (ATROVENT) 0.02 % nebulizer solution Take 2.5 mLs (0.5 mg total) by nebulization every 6 (six) hours as needed for wheezing or shortness of breath. 11/29/21   Sallyanne Kuster, NP  isosorbide mononitrate (IMDUR) 30 MG 24 hr tablet Take 30 mg by mouth daily. 08/13/21   [provider]  levothyroxine (SYNTHROID) 112 MCG tablet Take 112 mcg by mouth daily before breakfast. 09/25/20   [provider]  lidocaine (LIDODERM) 5 % Place 1 patch onto the skin daily. Remove & Discard patch within 12 hours or as directed by MD 04/21/23   Darlin Drop, DO  loratadine (CLARITIN) 10 MG tablet Take 10 mg by mouth daily.     [provider]  multivitamin (ONE-A-DAY MEN'S) TABS tablet Take 1 tablet by mouth daily.    [provider]  nitroGLYCERIN (NITROSTAT) 0.4 MG SL tablet Place 1 tablet (0.4 mg total) under the tongue every 5 (five) minutes as needed for chest pain. 10/23/17   Adrian Saran, MD  oxycodone (OXY-IR) 5 MG capsule Take 5 mg by mouth every 6 (six) hours as needed for pain.    [provider]  pantoprazole (PROTONIX) 40 MG tablet Take 40 mg by mouth daily. 01/17/23   [provider]  potassium chloride (KLOR-CON) 10 MEQ tablet TAKE TWO TABLETS EVERY OTHER DAY AND 1 TABLET ON THE DAYS IN BETWEEN Patient taking differently: 30 mEq daily. 05/06/22   Sallyanne Kuster, NP  spironolactone (ALDACTONE) 25 MG tablet Take 0.5 tablets (  12.5 mg total) by mouth daily. 04/22/23 06/21/23  Darlin Drop, DO  torsemide (DEMADEX) 20 MG tablet Take 2 tablets (40 mg total) by mouth daily. 04/21/23 05/21/23  Darlin Drop, DO  TRESIBA FLEXTOUCH 100 UNIT/ML FlexTouch Pen INJECT 70 UNITS UNDER THE SKIN TWICE A DAY 05/19/23   [provider]    Physical Exam: Vitals:   05/23/23 1643 05/23/23 1835 05/23/23 1900 05/23/23 2141  BP: 129/60  134/66 (!) 160/79  Pulse: 75 77 79 83  Resp: 20   18  Temp: 98.1 F (36.7 C)   99.1 F (37.3 C)  TempSrc: Oral   Oral  SpO2: 92% 91% 94% 95%  Weight:      Height:       Constitutional: appears older than chronological age, NAD, calm Eyes: PERRL, lids and conjunctivae normal ENMT: Mucous membranes are moist. Posterior pharynx clear of any exudate or lesions. Age-appropriate dentition. Hearing  appropriate Neck: normal, supple, no masses, no thyromegaly Respiratory: clear to auscultation bilaterally, no wheezing, no crackles. Normal respiratory effort. No accessory muscle use.  Cardiovascular: Regular rate and rhythm, no murmurs / rubs / gallops. Bilateral lower extremity edema. 2+ pedal pulses. No carotid bruits.  Abdomen: morbidly obese abdomen, no tenderness, no masses palpated, no hepatosplenomegaly. Bowel sounds positive.  Musculoskeletal: no clubbing / cyanosis. No joint deformity upper and lower extremities. Good ROM, no contractures, no atrophy. Normal muscle tone.  Skin: no rashes, lesions, ulcers. No induration Neurologic: Sensation intact. Strength 5/5 in all 4.  Psychiatric: Normal judgment and insight. Alert and oriented x 3. Normal mood.   EKG: independently reviewed, showing sinus rhythm with rate of 76, QTc 360  Chest x-ray on Admission: Not indicated at this time  Labs on Admission: I have personally reviewed following labs  CBC: Recent Labs  Lab 05/23/23 1329  WBC 15.1*  NEUTROABS 11.0*  HGB 11.6*  HCT 35.4*  MCV 93.2  PLT 285   Basic Metabolic Panel: Recent Labs  Lab 05/23/23 1329  NA 130*  K 3.7  CL 88*  CO2 28  GLUCOSE 178*  BUN 94*  CREATININE 3.32*  CALCIUM 14.2*   GFR: Estimated Creatinine Clearance: 28.5 mL/min (A) (by C-G formula based on SCr of 3.32 mg/dL (H)).  Liver Function Tests: Recent Labs  Lab 05/23/23 1329  AST 192*  ALT 89*  ALKPHOS 120  BILITOT 0.6  PROT 8.0  ALBUMIN 3.8   Cardiac Enzymes: Recent Labs  Lab 05/23/23 1329  CKTOTAL 1,533*   Urine analysis:    Component Value Date/Time   COLORURINE YELLOW (A) 09/25/2018 2059   APPEARANCEUR Clear 01/05/2022 1324   LABSPEC 1.012 09/25/2018 2059   PHURINE 7.0 09/25/2018 2059   GLUCOSEU Negative 01/05/2022 1324   HGBUR SMALL (A) 09/25/2018 2059   BILIRUBINUR Negative 01/05/2022 1324   KETONESUR NEGATIVE 09/25/2018 2059   PROTEINUR Negative 01/05/2022 1324    PROTEINUR NEGATIVE 09/25/2018 2059   NITRITE Negative 01/05/2022 1324   NITRITE NEGATIVE 09/25/2018 2059   LEUKOCYTESUR Negative 01/05/2022 1324   This document was prepared using Dragon Voice Recognition software and may include unintentional dictation errors.  Dr. Sedalia Muta [facility]  If 7PM-7AM, please contact overnight-coverage provider If 7AM-7PM, please contact day attending provider [email or web address]  05/23/2023, 10:35 PM

## 2023-05-23 NOTE — ED Notes (Signed)
Pt in recliner at this time

## 2023-05-23 NOTE — ED Notes (Signed)
Pt requesting something to eat at this time. RN approved a sandwich tray.

## 2023-05-23 NOTE — Assessment & Plan Note (Signed)
Persistent over the last month  Treat per above

## 2023-05-23 NOTE — Progress Notes (Signed)
NAME: Jason Hudson  DOB: 19-Mar-1951  MRN: [id number]  Date/Time: 05/23/2023 10:51 AM   Subjective:   ? Jason Hudson is a 72 y.o. is here for follow up after recent hospitalization for staph lugdunensis bacteremia and Thoracic discitis  CKD< COPD, PAF, , he was discharged on Iv daptomycin ( adjusted to Crcl) and ceftriaxone for 6 weeks He is here for follow up with his partner HE states that he is very weak and wobbly this week Has some itching but no rash Mid back pain better- still has some cramping pain in the front of chest Missed 2 doses of dapto says he was short He saw his PCP last week and she did a culture from the back nodule and it is neg so far  Past Medical History:  Diagnosis Date   CHF (congestive heart failure) (HCC)    COPD (chronic obstructive pulmonary disease) (HCC)    Diabetes mellitus without complication (HCC)    Edema    leg swelling   Gastroesophageal reflux disease 12/15/2017   Gout    Hypertension    Melena 11/05/2017   Pneumonia 07/27/2016    Past Surgical History:  Procedure Laterality Date   CENTRAL LINE INSERTION-TUNNELED N/A 04/21/2023   Procedure: CENTRAL LINE INSERTION-TUNNELED;  Surgeon: Gilda Crease Latina Craver, MD;  Location: [facility] INVASIVE CV LAB;  Service: Cardiovascular;  Laterality: N/A;   COLONOSCOPY WITH PROPOFOL N/A 11/07/2017   Procedure: COLONOSCOPY WITH PROPOFOL;  Surgeon: Wyline Mood, MD;  Location: [facility] ENDOSCOPY;  Service: Gastroenterology;  Laterality: N/A;   ESOPHAGOGASTRODUODENOSCOPY (EGD) WITH PROPOFOL N/A 11/07/2017   Procedure: ESOPHAGOGASTRODUODENOSCOPY (EGD) WITH PROPOFOL;  Surgeon: Wyline Mood, MD;  Location: [facility] ENDOSCOPY;  Service: Gastroenterology;  Laterality: N/A;   LEFT HEART CATH AND CORONARY ANGIOGRAPHY N/A 10/23/2017   Procedure: LEFT HEART CATH AND CORONARY ANGIOGRAPHY;  Surgeon: Marcina Millard, MD;  Location: [facility] INVASIVE CV LAB;  Service: Cardiovascular;  Laterality: N/A;    Social History    Socioeconomic History   Marital status: Single    Spouse name: Not on file   Number of children: Not on file   Years of education: Not on file   Highest education level: Not on file  Occupational History   Not on file  Tobacco Use   Smoking status: Never   Smokeless tobacco: Never  Substance and Sexual Activity   Alcohol use: Not Currently    Comment: ocassionally   Drug use: Never   Sexual activity: Not on file  Other Topics Concern   Not on file  Social History Narrative   Not on file   Social Determinants of Health   Financial Resource Strain: Low Risk  (05/17/2023)   Received from [facility]   Overall Financial Resource Strain (CARDIA)    Difficulty of Paying Living Expenses: Not hard at all  Food Insecurity: No Food Insecurity (05/17/2023)   Received from [facility]   Hunger Vital Sign    Worried About Running Out of Food in the Last Year: Never true    Ran Out of Food in the Last Year: Never true  Transportation Needs: No Transportation Needs (05/17/2023)   Received from [facility] - Transportation    In the past 12 months, has lack of transportation kept you from medical appointments or from getting medications?: No    Lack of Transportation (Non-Medical): No  Physical Activity: Not on file  Stress: Not on file  Social Connections: Not on  file  Intimate Partner Violence: Not At Risk (04/13/2023)   Humiliation, Afraid, Rape, and Kick questionnaire    Fear of Current or Ex-Partner: No    Emotionally Abused: No    Physically Abused: No    Sexually Abused: No    Family History  Problem Relation Age of Onset   Cancer Mother    CVA Father    Heart disease Father    Allergies  Allergen Reactions   Atorvastatin Other (See Comments)    Other reaction(s): Not available Other reaction(s): Other (See Comments)    Red Dye #40 (Allura Red) Other (See Comments)    "not as allergic to it as I used to  be. I can take a little of it"   I? Current Outpatient Medications  Medication Sig Dispense Refill   albuterol (VENTOLIN HFA) 108 (90 Base) MCG/ACT inhaler Inhale 1-2 puffs into the lungs every 6 (six) hours as needed for wheezing or shortness of breath. 1 each 5   allopurinol (ZYLOPRIM) 100 MG tablet TAKE ONE TABLET (100 MG) BY MOUTH ONCE DAILY 90 tablet 1   aspirin EC 81 MG tablet Take 81 mg by mouth daily. Swallow whole.     budesonide-formoterol (SYMBICORT) 160-4.5 MCG/ACT inhaler Inhale 2 puffs into the lungs 2 (two) times daily.     calcitRIOL (ROCALTROL) 0.25 MCG capsule Take 0.25 mcg by mouth daily.     carvedilol (COREG) 6.25 MG tablet Take 6.25 mg by mouth 2 (two) times daily with a meal. Pt gets from cardiologist     cefTRIAXone (ROCEPHIN) IVPB Inject 2 g into the vein daily. Indication:  Vertebral osteomyelitis First Dose: Yes Last Day of Therapy:  05/30/23 Labs - Once weekly: CBC with differential, CK, CMP, CRP, ESR Monitor for rhabomyolysis or eosinophilia Fax weekly lab results  promptly to [phone]  Please leave central line  place until doctor ( vascular surgeon) has seen patient and will remove the cath Method of administration: IV Push Method of administration may be changed at the discretion of home infusion pharmacist based upon assessment of the patient and/or caregiver's ability to self-administer the medication ordered. 39 Units 0   Cholecalciferol (VITAMIN D3) 50 MCG (2000 UT) TABS Take 2,000 Units by mouth at bedtime. 30 tablet 2   daptomycin (CUBICIN) IVPB Inject 800 mg into the vein every other day. Indication:  Vertebral osteomyelitis First Dose: Yes Last Day of Therapy:  05/30/23 Labs - Once weekly:  CBC with differential, CK, CMP, CRP, ESR Monitor for rhabomyolysis or eosinophilia Fax weekly lab results  promptly to [phone]  Please leave central line  place until doctor ( vascular surgeon) has seen patient and will remove the cath Method of  administration: IV Push Method of administration may be changed at the discretion of home infusion pharmacist based upon assessment of the patient and/or caregiver's ability to self-administer the medication ordered. 20 Units 0   diphenhydrAMINE (BENADRYL) 25 mg capsule Take 25 mg by mouth every 6 (six) hours as needed for itching or allergies.     doxazosin (CARDURA) 4 MG tablet TAKE 1 TABLET BY MOUTH AT BEDTIME 30 tablet 3   glipiZIDE (GLUCOTROL XL) 5 MG 24 hr tablet Take 5 mg by mouth daily with breakfast.     ipratropium (ATROVENT) 0.02 % nebulizer solution Take 2.5 mLs (0.5 mg total) by nebulization every 6 (six) hours as needed for wheezing or shortness of breath. 225 mL 1   isosorbide mononitrate (IMDUR) 30 MG 24 hr tablet  Take 30 mg by mouth daily.     levothyroxine (SYNTHROID) 112 MCG tablet Take 112 mcg by mouth daily before breakfast.     lidocaine (LIDODERM) 5 % Place 1 patch onto the skin daily. Remove & Discard patch within 12 hours or as directed by MD 30 patch 0   loratadine (CLARITIN) 10 MG tablet Take 10 mg by mouth daily.      multivitamin (ONE-A-DAY MEN'S) TABS tablet Take 1 tablet by mouth daily.     nitroGLYCERIN (NITROSTAT) 0.4 MG SL tablet Place 1 tablet (0.4 mg total) under the tongue every 5 (five) minutes as needed for chest pain. 30 tablet 0   oxycodone (OXY-IR) 5 MG capsule Take 5 mg by mouth every 6 (six) hours as needed for pain.     pantoprazole (PROTONIX) 40 MG tablet Take 40 mg by mouth daily.     potassium chloride (KLOR-CON) 10 MEQ tablet TAKE TWO TABLETS EVERY OTHER DAY AND 1 TABLET ON THE DAYS IN BETWEEN (Patient taking differently: 30 mEq daily.) 180 tablet 1   spironolactone (ALDACTONE) 25 MG tablet Take 0.5 tablets (12.5 mg total) by mouth daily. 30 tablet 0   TRESIBA FLEXTOUCH 100 UNIT/ML FlexTouch Pen INJECT 70 UNITS UNDER THE SKIN TWICE A DAY     amiodarone (PACERONE) 200 MG tablet Take 1 tablet (200 mg total) by mouth daily. 30 tablet 0   torsemide  (DEMADEX) 20 MG tablet Take 2 tablets (40 mg total) by mouth daily. 30 tablet 0   No current facility-administered medications for this visit.   Facility-Administered Medications Ordered in Other Visits  Medication Dose Route Frequency Provider Last Rate Last Admin   Heparin (Porcine) in NaCl 1000-0.9 UT/500ML-% SOLN    PRN Schnier, Latina Craver, MD   500 mL at 04/21/23 1611   lidocaine-EPINEPHrine (PF) (XYLOCAINE-EPINEPHrine) 1 %-1:200000 (PF) injection    PRN Schnier, Latina Craver, MD   20 mL at 04/21/23 1611     Abtx:  Anti-infectives (From admission, onward)    None       REVIEW OF SYSTEMS:  Const: negative fever, negative chills, negative weight loss Eyes: negative diplopia or visual changes, negative eye pain ENT: negative coryza, negative sore throat Resp: negative cough, hemoptysis, dyspnea Cards: negative for chest pain, palpitations, lower extremity edema GU: negative for frequency, dysuria and hematuria GI: Negative for abdominal pain, diarrhea, bleeding, constipation Skin:  pruritus Heme: negative for easy bruising and gum/nose bleeding MS: weakness , legs wobbly Neurolo:negative for headaches, dizziness, vertigo, memory problems  Psych:  anxiety, depression  Endocrine:  diabetes Allergy/Immunology- atorvastatin  Objective:  VITALS:  BP (!) 154/84   Pulse 73   Temp (!) 97.4 F (36.3 C) (Oral)   SpO2 93%   LDA Central line- rt IJ PHYSICAL EXAM:  General: Alert, cooperative, Increase in BMI weak Head: Normocephalic, without obvious abnormality, atraumatic. Eyes: Conjunctivae clear, anicteric sclerae. Pupils are equal ENT Nares normal. No drainage or sinus tenderness. Lips, mucosa, and tongue normal. No Thrush Neck: Supple, symmetrical, no adenopathy, thyroid: non tender no carotid bruit and no JVD. Back: No CVA tenderness. Lungs: Clear to auscultation bilaterally. No Wheezing or Rhonchi. No rales. Heart: Regular rate and rhythm, no murmur, rub or  gallop. Abdomen: did not examine Extremities: atraumatic, no cyanosis. No edema. No clubbing Skin: No rashes or lesions. Lymph: Cervical, supraclavicular normal. Neurologic: Grossly non-focal Pertinent Labs 9/15 labs CK 713 Cr 2.57 ESR 112 CRP 17   Impression/Recommendation Recent hospitalization 8/21-8/30  Staph lugdunensis bacteremia  with T5-T6 discitis and osteomyelitis. Also has ventral epidural phlegmon and paraspinal phlegmon. No drainable fluid collection . Was placed on IV daptomycin and ceftriaxone for 6 weeks Here for follow up Concerned that he may be having side effects because of being weak and wobbly and also CKD- need to r/o rhabdomyolysis And worsening renal function He is to finish IV antibiotic on 10/8 but need to look at labs first  CKD PAF CHF Obesity  Discussed the management with patient and partner  P.s Pt had labs and calcium 14.2, Cr 3.32, CK 1533 Spoke to patient and asked him to go to ED. Spoke to Dr.Jessup.  DC daptomycin ( last dose was on Sunday) Repeat CMP Need renal consult Need to correct hypercalcemia ? ?Discussed with patient, and partner in detail  Note:  This document was prepared using Dragon voice recognition software and may include unintentional dictation errors.

## 2023-05-23 NOTE — Assessment & Plan Note (Addendum)
Nephrology, Dr. Cherylann Ratel has been consulted Strict I's and O's

## 2023-05-23 NOTE — ED Triage Notes (Addendum)
Pt sent to ER for eval of abnormal labs.  Labs drawn at 130 today-calcium 14.2  bun and creatinine elevated also  pt alert  speech clear.   Pt reports pain all over body.  Pt has a picc line. Hx vertebral  osteomyelitis

## 2023-05-23 NOTE — Assessment & Plan Note (Signed)
-   This complicates overall care and prognosis.  

## 2023-05-23 NOTE — Patient Instructions (Signed)
You are here for follow up of the infection in the spine- you are on daptomycin and ceftriaxone Today will do labs to check your kidney and muscle enzyme Continue IV antibiotic until 10/8- may need a couple of days more Follow up 1 month

## 2023-05-23 NOTE — Assessment & Plan Note (Addendum)
Staph lugdunensis bacteremia with T5-T6 discitis and osteomyelitis.  Epic order placed to infectious disease consultation for change in medication as patient will no longer be on daptomycin

## 2023-05-23 NOTE — Telephone Encounter (Signed)
Received call from Muhammed at [facility] to report critical lab value: calcium 14.2 (received result at 2:05 PM).   Notified Dr. Rivka Safer of critical value at 2:07 PM. She would like to know patient's creatinine.   Called Muhammed back, creatinine is 3.32. Notified Dr. Rivka Safer. She recommends Aengus go to the ED.   Called Keymani x 2, no answer. Called his significant other, Chrisy, listed in emergency contacts. She handed the phone to [place]. Leonidas Romberg that Dr. Rivka Safer would like him to go to the emergency room for further evaluation. He states he has to drop someone off at home, but that he should be checking into the ED in the next hour or two. Notified Dr. Rivka Safer.   Sandie Ano, RN

## 2023-05-23 NOTE — Assessment & Plan Note (Signed)
Home glipizide not resumed on admission Insulin SSI with at bedtime coverage, renal dosing ordered Goal inpatient blood glucose levels 140-180

## 2023-05-24 DIAGNOSIS — M4644 Discitis, unspecified, thoracic region: Secondary | ICD-10-CM

## 2023-05-24 DIAGNOSIS — N179 Acute kidney failure, unspecified: Secondary | ICD-10-CM

## 2023-05-24 DIAGNOSIS — M6282 Rhabdomyolysis: Secondary | ICD-10-CM

## 2023-05-24 DIAGNOSIS — R7881 Bacteremia: Secondary | ICD-10-CM

## 2023-05-24 LAB — BASIC METABOLIC PANEL
Anion gap: 10 (ref 5–15)
BUN: 103 mg/dL — ABNORMAL HIGH (ref 8–23)
CO2: 29 mmol/L (ref 22–32)
Calcium: 13.5 mg/dL (ref 8.9–10.3)
Chloride: 92 mmol/L — ABNORMAL LOW (ref 98–111)
Creatinine, Ser: 3.23 mg/dL — ABNORMAL HIGH (ref 0.61–1.24)
GFR, Estimated: 20 mL/min — ABNORMAL LOW (ref >60)
Glucose, Bld: 178 mg/dL — ABNORMAL HIGH (ref 70–99)
Potassium: 3.9 mmol/L (ref 3.5–5.1)
Sodium: 131 mmol/L — ABNORMAL LOW (ref 135–145)

## 2023-05-24 LAB — CBC
HCT: 33 % — ABNORMAL LOW (ref 39.0–52.0)
Hemoglobin: 11 g/dL — ABNORMAL LOW (ref 13.0–17.0)
MCH: 30.5 pg (ref 26.0–34.0)
MCHC: 33.3 g/dL (ref 30.0–36.0)
MCV: 91.4 fL (ref 80.0–100.0)
Platelets: 290 10*3/uL (ref 150–400)
RBC: 3.61 MIL/uL — ABNORMAL LOW (ref 4.22–5.81)
RDW: 14.5 % (ref 11.5–15.5)
WBC: 15.2 10*3/uL — ABNORMAL HIGH (ref 4.0–10.5)
nRBC: 0 % (ref 0.0–0.2)

## 2023-05-24 LAB — CK: Total CK: 801 U/L — ABNORMAL HIGH (ref 49–397)

## 2023-05-24 LAB — GLUCOSE, CAPILLARY
Glucose-Capillary: 165 mg/dL — ABNORMAL HIGH (ref 70–99)
Glucose-Capillary: 138 mg/dL — ABNORMAL HIGH (ref 70–99)

## 2023-05-24 MED ORDER — FLEET ENEMA RE ENEM
1.0000 | ENEMA | Freq: Every day | RECTAL | Status: DC | PRN
  Administered 2023-05-25: 1 via RECTAL

## 2023-05-24 MED ORDER — HYDROCERIN EX CREA
TOPICAL_CREAM | Freq: Every day | CUTANEOUS | Status: DC
  Filled 2023-05-24: qty 113

## 2023-05-24 MED ORDER — ADULT MULTIVITAMIN W/MINERALS CH
1.0000 | ORAL_TABLET | Freq: Every day | ORAL | Status: DC
  Administered 2023-05-24 – 2023-05-26 (×3): 1 via ORAL
  Filled 2023-05-24 (×3): qty 1

## 2023-05-24 MED ORDER — SPIRONOLACTONE 25 MG PO TABS
25.0000 mg | ORAL_TABLET | Freq: Every day | ORAL | Status: DC
  Administered 2023-05-25 – 2023-05-26 (×2): 25 mg via ORAL
  Filled 2023-05-24 (×2): qty 1

## 2023-05-24 MED ORDER — TRIAMCINOLONE 0.1 % CREAM:EUCERIN CREAM 1:1
TOPICAL_CREAM | Freq: Every day | CUTANEOUS | Status: DC
  Filled 2023-05-24: qty 1

## 2023-05-24 MED ORDER — CALCITONIN (SALMON) 200 UNIT/ML IJ SOLN
400.0000 [IU] | INTRAMUSCULAR | Status: AC
  Administered 2023-05-24: 400 [IU] via SUBCUTANEOUS
  Filled 2023-05-24: qty 2

## 2023-05-24 MED ORDER — TRIAMCINOLONE ACETONIDE 0.1 % EX CREA
TOPICAL_CREAM | Freq: Every day | CUTANEOUS | Status: DC
  Filled 2023-05-24: qty 15

## 2023-05-24 MED ORDER — SODIUM CHLORIDE 0.9 % IV SOLN
100.0000 mg | Freq: Two times a day (BID) | INTRAVENOUS | Status: DC
  Administered 2023-05-24 – 2023-05-26 (×5): 100 mg via INTRAVENOUS
  Filled 2023-05-24 (×5): qty 100

## 2023-05-24 MED ORDER — ENSURE MAX PROTEIN PO LIQD
11.0000 [oz_av] | Freq: Every day | ORAL | Status: DC
  Administered 2023-05-24 – 2023-05-26 (×3): 11 [oz_av] via ORAL
  Filled 2023-05-24: qty 330

## 2023-05-24 MED ORDER — FAMOTIDINE 20 MG PO TABS
20.0000 mg | ORAL_TABLET | Freq: Two times a day (BID) | ORAL | Status: DC
  Administered 2023-05-24 – 2023-05-26 (×5): 20 mg via ORAL
  Filled 2023-05-24 (×5): qty 1

## 2023-05-24 MED ORDER — TORSEMIDE 20 MG PO TABS
80.0000 mg | ORAL_TABLET | Freq: Every day | ORAL | Status: DC
  Administered 2023-05-25 – 2023-05-26 (×2): 80 mg via ORAL
  Filled 2023-05-24 (×2): qty 4

## 2023-05-24 MED ORDER — SUCRALFATE 1 GM/10ML PO SUSP
1.0000 g | Freq: Three times a day (TID) | ORAL | Status: DC
  Administered 2023-05-24 – 2023-05-26 (×8): 1 g via ORAL
  Filled 2023-05-24 (×8): qty 10

## 2023-05-24 MED ORDER — ALUM & MAG HYDROXIDE-SIMETH 200-200-20 MG/5ML PO SUSP
30.0000 mL | ORAL | Status: DC | PRN
  Administered 2023-05-24: 30 mL via ORAL
  Filled 2023-05-24: qty 30

## 2023-05-24 MED ORDER — POLYETHYLENE GLYCOL 3350 17 G PO PACK
17.0000 g | PACK | Freq: Every day | ORAL | Status: DC
  Administered 2023-05-24 – 2023-05-26 (×3): 17 g via ORAL
  Filled 2023-05-24 (×3): qty 1

## 2023-05-24 MED ORDER — SODIUM CHLORIDE 0.9 % IV SOLN: INTRAVENOUS | Status: DC

## 2023-05-24 MED ORDER — CHLORHEXIDINE GLUCONATE CLOTH 2 % EX PADS
6.0000 | MEDICATED_PAD | Freq: Every day | CUTANEOUS | Status: DC
  Administered 2023-05-24 – 2023-05-26 (×3): 6 via TOPICAL

## 2023-05-24 MED ORDER — PNEUMOCOCCAL 20-VAL CONJ VACC 0.5 ML IM SUSY
0.5000 mL | PREFILLED_SYRINGE | INTRAMUSCULAR | Status: AC
  Administered 2023-05-26: 0.5 mL via INTRAMUSCULAR

## 2023-05-24 MED ORDER — PANTOPRAZOLE SODIUM 40 MG PO TBEC
40.0000 mg | DELAYED_RELEASE_TABLET | Freq: Two times a day (BID) | ORAL | Status: DC
  Administered 2023-05-24 – 2023-05-26 (×4): 40 mg via ORAL
  Filled 2023-05-24 (×4): qty 1

## 2023-05-24 NOTE — Evaluation (Signed)
Occupational Therapy Evaluation Patient Details Name: Jason Hudson MRN: [id number] DOB: [birth year] Today's Date: 05/24/2023   History of Present Illness Pt is a 72 year old male presenting to the ED from ID clinic, admitted with hypercalcemia;  PMH significant for Leukocytosis, Essential hypertension, Paroxysmal atrial fibrillation (HCC), Obesity, Class III, BMI 40-49.9 (morbid obesity) (HCC), Obstructive sleep apnea of adult, Acute exacerbation of CHF (congestive heart failure) (HCC),AKI (acute kidney injury) (HCC),Hypercholesterolemia, Hyperlipidemia due to type 2 diabetes mellitus (HCC), Benign hypertensive kidney disease with chronic kidney disease,Type 2 diabetes mellitus (HCC),Elevated LFTs, Bacteremia   Clinical Impression   Chart reviewed, MD/nurse cleared pt for participation in OT evaluation. Pt is agreeable to OT evaluation, alert and oriented x4. PTA pt was recently discharged from rehab, was amb household/short community distances with no AD, driving himself to appts. He required assist for LB dressing and bathing and assist with cooking/cleaning from his partner, all other ADLs generally MOD I; Pt presents with deficits in activity tolerance and balance, BUE function, affecting safe and optimal ADL completion. CGA required for functional transfers, amb to bathroom with supervision-CGA with RW, LB dressing with MAX A. SET UP for grooming/feeding tasks. Pt will benefit from post acute OT to address deficits and to facilitate optimal ADL performance. OT will follow acutely.       If plan is discharge home, recommend the following: A little help with walking and/or transfers;A lot of help with bathing/dressing/bathroom;Assistance with cooking/housework;Assist for transportation;Help with stairs or ramp for entrance    Functional Status Assessment  Patient has had a recent decline in their functional status and demonstrates the ability to make significant improvements in function  in a reasonable and predictable amount of time.  Equipment Recommendations  Tub/shower bench (2WW)    Recommendations for Other Services       Precautions / Restrictions Precautions Precautions: Fall      Mobility Bed Mobility               General bed mobility comments: NT in recliner pre/post session; sleeps in recliner at baseline    Transfers Overall transfer level: Needs assistance   Transfers: Sit to/from Stand Sit to Stand: Supervision, Contact guard assist (with RW)                  Balance Overall balance assessment: Needs assistance Sitting-balance support: Feet supported Sitting balance-Leahy Scale: Good     Standing balance support: Bilateral upper extremity supported, During functional activity, Reliant on assistive device for balance Standing balance-Leahy Scale: Fair                             ADL either performed or assessed with clinical judgement   ADL Overall ADL's : Needs assistance/impaired Eating/Feeding: Set up;Sitting   Grooming: Set up;Sitting               Lower Body Dressing: Maximal assistance Lower Body Dressing Details (indicate cue type and reason): has assist at baseline Toilet Transfer: Contact guard assist;Rolling walker (2 wheels);BSC/3in1;Ambulation;Grab bars   Toileting- Clothing Manipulation and Hygiene: Contact guard assist;Sitting/lateral lean       Functional mobility during ADLs: Contact guard assist;Rolling walker (2 wheels) (approx 15' two attempts with RW)       Vision Patient Visual Report: No change from baseline       Perception         Praxis         Pertinent  Vitals/Pain Pain Assessment Pain Assessment: No/denies pain     Extremity/Trunk Assessment Upper Extremity Assessment Upper Extremity Assessment: RUE deficits/detail;LUE deficits/detail RUE Deficits / Details: B baseeline rotator cuff injuries per pt report; shoulder flexion to approx 80 degrees LUE Deficits  / Details: B baseeline rotator cuff injuries per pt report; shoulder flexion to approx 80 degrees   Lower Extremity Assessment Lower Extremity Assessment: Defer to PT evaluation   Cervical / Trunk Assessment Cervical / Trunk Assessment: Normal   Communication Communication Communication: No apparent difficulties Cueing Techniques: Verbal cues   Cognition Arousal: Alert Behavior During Therapy: WFL for tasks assessed/performed Overall Cognitive Status: Within Functional Limits for tasks assessed                                       General Comments  BLE wrapped; BP 137/51 (MAP 71) Hr 72 bpm, spo2 96% on RA prior to mobility; spo2 >95% on RA post mobility with HR in the 80s bpm    Exercises Other Exercises Other Exercises: edu re: role of OT, role of rehab, discharge recommendations, safe ADL completion   Shoulder Instructions      Home Living Family/patient expects to be discharged to:: Private residence Living Arrangements: Spouse/significant other Available Help at Discharge: Family;Available 24 hours/day Type of Home: House Home Access: Stairs to enter [employer] of Steps: 4 Entrance Stairs-Rails: Right;Left;Can reach both Home Layout: One level     Bathroom Shower/Tub: Tub/shower unit;Sponge bathes at baseline   Bathroom Toilet: Handicapped height Bathroom Accessibility: Yes   Home Equipment: Cane - single point;Standard Walker          Prior Functioning/Environment Prior Level of Function : Independent/Modified Independent;Needs assist             Mobility Comments: amb with no AD household/short community distances after rehab, up until about a week ago ADLs Comments: assist for LB dressing and bathing (sponge baths currently); MOD I in feeding, grooming, toileting; pt reports he is currently driving, partner assists with cooking/cleaning        OT Problem List: Decreased activity tolerance;Impaired balance (sitting and/or  standing);Decreased knowledge of use of DME or AE      OT Treatment/Interventions: Self-care/ADL training;Balance training;Therapeutic exercise;Therapeutic activities;DME and/or AE instruction;Patient/family education    OT Goals(Current goals can be found in the care plan section) Acute Rehab OT Goals Patient Stated Goal: home health rehab OT Goal Formulation: With patient Time For Goal Achievement: 06/07/23 Potential to Achieve Goals: Good ADL Goals Pt Will Perform Grooming: with modified independence;sitting Pt Will Perform Lower Body Dressing: with modified independence;sit to/from stand;with adaptive equipment Pt Will Transfer to Toilet: with modified independence;ambulating Pt Will Perform Toileting - Clothing Manipulation and hygiene: with modified independence;sit to/from stand  OT Frequency: Min 1X/week    Co-evaluation              AM-PAC OT "6 Clicks" Daily Activity     Outcome Measure Help from another person eating meals?: None Help from another person taking care of personal grooming?: None Help from another person toileting, which includes using toliet, bedpan, or urinal?: A Little Help from another person bathing (including washing, rinsing, drying)?: A Lot Help from another person to put on and taking off regular upper body clothing?: None Help from another person to put on and taking off regular lower body clothing?: A Lot 6 Click Score: 19   End  of Session Equipment Utilized During Treatment: Rolling walker (2 wheels) Nurse Communication: Mobility status;Other (comment)  Activity Tolerance: Patient tolerated treatment well Patient left: in chair;with call bell/phone within reach  OT Visit Diagnosis: Other abnormalities of gait and mobility (R26.89)                Time: 1140-1210 OT Time Calculation (min): 30 min Charges:  OT General Charges $OT Visit: 1 Visit OT Evaluation $OT Eval Moderate Complexity: 1 Mod Oleta Mouse, OTD OTR/L  05/24/23, 12:45 PM

## 2023-05-24 NOTE — Progress Notes (Signed)
Initial Nutrition Assessment  DOCUMENTATION CODES:   Morbid obesity  INTERVENTION:   -MVI with minerals daily -Ensure Max po daily, each supplement provides 150 kcal and 30 grams of protein.   -Liberalize diet to 2 gram sodium for wider variety of meal selections  NUTRITION DIAGNOSIS:   Increased nutrient needs related to chronic illness (COPD) as evidenced by estimated needs.  GOAL:   Patient will meet greater than or equal to 90% of their needs  MONITOR:   PO intake, Supplement acceptance  REASON FOR ASSESSMENT:   Malnutrition Screening Tool    ASSESSMENT:   Pt with history of staph lugdunensis bacteremia and thoracic discitis, COPD, paroxysmal atrial fibrillation, who presents for chief concerns of acute kidney injury in setting of daptomycin use.  Pt admitted with hypercalcemia, AKI, and lymphedema.   Reviewed I/O's: -1.3 L x 24 hours   Per nephrology notes, no indication for HD at this time. Pt with history of severe indigestion for past 2-3 years, using tums and antacids for relief. ID also concerns for hypercalcemia for daptomycin use.   Per [facility] notes, pt with longstanding history of lymphedema.   Pt sleeping soundly in recliner chair at time of visit. He did not respond to voice or touch. Noted meal tray at bedside, which was 100% completed. Documented meal completions 100%.   Reviewed wt hx; pt has experienced a 4% wt loss over the past year, which is not significant for time frame.   Medications reviewed and include aldactone and 0.9% sodium chloride infusion @ 100 ml/hr.   Obesity is a complex, chronic medical condition that is optimally managed by a multidisciplinary care team. Weight loss is not an ideal goal for an acute inpatient hospitalization. However, if further work-up for obesity is warranted, consider outpatient referral to [facility].    Lab Results  Component Value Date   HGBA1C 11.3 (H)  04/13/2023   PTA DM medications are 10 mg glipzide daily, 120 units tresiba daily.   Labs reviewed: Na: 131, CBGS: 138-165 (inpatient orders for glycemic control are 0-5 units insulin aspart daily at bedtime and 0-9 units insulin aspart TID with meals).    NUTRITION - FOCUSED PHYSICAL EXAM:  Flowsheet Row Most Recent Value  Orbital Region No depletion  Upper Arm Region No depletion  Thoracic and Lumbar Region No depletion  Buccal Region No depletion  Temple Region No depletion  Clavicle Bone Region No depletion  Clavicle and Acromion Bone Region No depletion  Scapular Bone Region No depletion  Dorsal Hand No depletion  Patellar Region No depletion  Anterior Thigh Region No depletion  Posterior Calf Region No depletion  Edema (RD Assessment) Moderate  Hair Reviewed  Eyes Reviewed  Mouth Reviewed  Skin Reviewed  Nails Reviewed       Diet Order:   Diet Order             Diet Carb Modified Fluid consistency: Thin  Diet effective now                   EDUCATION NEEDS:   No education needs have been identified at this time  Skin:  Skin Assessment: Reviewed RN Assessment  Last BM:  05/22/23  Height:   Ht Readings from Last 1 Encounters:  05/23/23 5\' 9"  (1.753 m)    Weight:   Wt Readings from Last 1 Encounters:  05/23/23 (!) 137.4 kg    Ideal Body Weight:  72.7 kg  BMI:  Body mass index is 44.75 kg/m.  Estimated Nutritional Needs:   Kcal:  1700-1900  Protein:  105-120 grams  Fluid:  1.7-1.9 L    Levada Schilling, RD, LDN, CDCES Registered Dietitian III Certified Diabetes Care and Education Specialist Please refer to [facility] for RD and/or RD on-call/weekend/after hours pager

## 2023-05-24 NOTE — TOC Initial Note (Signed)
Transition of Care [facility]) - Initial/Assessment Note    Patient Details  Name: Jason Hudson MRN: [id number] Date of Birth: Apr 05, 1951  Transition of Care [facility]) CM/SW Contact:    Chapman Fitch, RN Phone Number: 05/24/2023, 2:32 PM  Clinical Narrative:                  Met wit patient at bedside Admitted for: hypocalcemia  Admitted from: home with significant other  PCP: kalisetti  Pharmacy: Current home health/prior home health/DME: RW, cane, O2 CPAP  Therapy recommending home health Patient is active with RN, PT, OT through [facility].  Barbara Cower with [facility] aware of admission  Prior to admission patient was receiving home IV antibiotics with a planned stop date of 10/8.  Pam with [facility] aware of admission   Expected Discharge Plan: Home w Home Health Services     Patient Goals and CMS Choice            Expected Discharge Plan and Services                                   HH Arranged: PT, OT, RN [facility] Agency: [facility] Date [facility] Agency Contacted: 05/24/23   Representative spoke with at [facility] Agency: Barbara Cower  Prior Living Arrangements/Services                       Activities of Daily Living   ADL Screening (condition at time of admission) Independently performs ADLs?: No Does the patient have a NEW difficulty with bathing/dressing/toileting/self-feeding that is expected to last >3 days?: Yes (Initiates electronic notice to provider for possible OT consult) Does the patient have a NEW difficulty with getting in/out of bed, walking, or climbing stairs that is expected to last >3 days?: Yes (Initiates electronic notice to provider for possible PT consult) Does the patient have a NEW difficulty with communication that is expected to last >3 days?: No Is the patient deaf or have difficulty hearing?: No Does the patient have difficulty seeing, even when wearing glasses/contacts?: No Does the patient have  difficulty concentrating, remembering, or making decisions?: No  Permission Sought/Granted                  Emotional Assessment              Admission diagnosis:  Hypercalcemia [E83.52] AKI (acute kidney injury) (HCC) [N17.9] Non-traumatic rhabdomyolysis [M62.82] Patient Active Problem List   Diagnosis Date Noted   Hypocalcemia 05/23/2023   Elevated LFTs 05/23/2023   Bacteremia 05/23/2023   Vertebral osteomyelitis (HCC) 04/20/2023   Coag negative Staphylococcus bacteremia 04/19/2023   Discitis thoracic region 04/19/2023   Sepsis due to pneumonia (HCC) 04/13/2023   Leukocytosis 04/13/2023   Chest pain 04/13/2023   Paroxysmal atrial fibrillation (HCC) 04/13/2023   Obesity, Class III, BMI 40-49.9 (morbid obesity) (HCC) 04/13/2023   CHF (congestive heart failure) (HCC) 04/12/2023   Gastric ulcer 05/18/2021   Benign hypertensive kidney disease with chronic kidney disease 02/15/2021   Proteinuria 02/15/2021   Secondary hyperparathyroidism of renal origin (HCC) 02/15/2021   Chronic kidney disease, stage 4 (severe) (HCC) 10/28/2020   Hypertension 10/28/2020   Low back pain due to bilateral sciatica 02/19/2020   Lumbar radiculopathy 02/18/2020   Encounter for general adult medical examination with abnormal findings 08/31/2019   Flu vaccine need 08/31/2019   Need for vaccination against  Streptococcus pneumoniae using pneumococcal conjugate vaccine 13 08/31/2019   Dysuria 08/31/2019   Irritable bowel syndrome with constipation 05/29/2019   Chronic kidney disease due to type 2 diabetes mellitus (HCC) 05/22/2019   Type 2 diabetes mellitus with hyperglycemia (HCC) 03/17/2019   Clinically significant macular edema 03/01/2019   Lower extremity edema 12/08/2018   Edema of lower extremity 12/08/2018   Diabetic ulcer of calf (HCC) 11/10/2018   Restrictive lung disease 10/26/2018   Acute exacerbation of CHF (congestive heart failure) (HCC) 09/12/2018   Acute on chronic systolic  congestive heart failure (HCC) 09/06/2018   AKI (acute kidney injury) (HCC) 09/06/2018   Acute kidney failure, unspecified (HCC) 09/06/2018   Moderate intermittent asthma without complication 08/30/2018   Coronary arteriosclerosis 08/30/2018   Diastolic dysfunction 08/30/2018   Hypercholesterolemia 08/30/2018   Obesity 08/30/2018   COPD exacerbation (HCC) 08/24/2018   Hyperlipidemia due to type 2 diabetes mellitus (HCC) 03/28/2018   Type 2 diabetes mellitus with chronic kidney disease, with long-term current use of insulin (HCC) 03/28/2018   Type 2 diabetes mellitus with stage 4 chronic kidney disease, with long-term current use of insulin (HCC) 03/28/2018   Type 2 diabetes mellitus (HCC) 03/28/2018   Essential hypertension 01/19/2018   Chronic obstructive lung disease (HCC) 12/15/2017   Gastroesophageal reflux disease 12/15/2017   Obstructive sleep apnea of adult 12/15/2017   Chronic obstructive pulmonary disease (HCC) 12/15/2017   Melena 11/05/2017   Congestive heart failure (HCC) 10/20/2017   Heart failure, unspecified (HCC) 10/20/2017   Pneumonia 07/27/2016   PCP:  Enid Baas, MD Pharmacy:   [facility] - [place], [place] - [address] [place] [address] Phone: [phone] Fax: [phone]  [facility] - [place], [place] - [address] [place] [address] Phone: [phone] Fax: [phone]     Social Determinants of Health (SDOH) Social History: SDOH Screenings   Food Insecurity: No Food Insecurity (05/23/2023)  Housing: Low Risk  (05/23/2023)  Transportation Needs: No Transportation Needs (05/23/2023)  Utilities: Not At Risk (05/23/2023)  Alcohol Screen: Low Risk  (05/11/2022)  Depression (PHQ2-9): Low Risk  (05/23/2023)  Financial Resource Strain: Low Risk  (05/17/2023)   [facility] from [facility]  Tobacco Use: Low Risk  (05/23/2023)   Received from [facility]   SDOH Interventions:     Readmission Risk Interventions    04/14/2023   10:31 AM  Readmission Risk Prevention Plan  Transportation Screening Complete  PCP or Specialist Appt within 3-5 Days Complete  HRI or Home Care Consult Complete  Social Work Consult for Recovery Care Planning/Counseling Complete  Palliative Care Screening Not Complete  Medication Review Oceanographer) Complete

## 2023-05-24 NOTE — Plan of Care (Signed)
Problem: Coping: Goal: Ability to adjust to condition or change in health will improve Outcome: Progressing   Problem: Fluid Volume: Goal: Ability to maintain a balanced intake and output will improve Outcome: Progressing

## 2023-05-24 NOTE — Progress Notes (Signed)
[facility]  ROUNDING NOTE   Subjective:   Jason Hudson  is a 72 y.o. male with past medical history of chronic systolic heart failure, atrial fibrillation on anticoagulation and chronic kidney disease stage IV.  Patient presents to the emergency department with abnormal labs.  Patient has been admitted for Hypercalcemia [E83.52] AKI (acute kidney injury) (HCC) [N17.9] Non-traumatic rhabdomyolysis [M62.82]  Patient is known to our practice and is followed by Dr Wynelle Link. He was last seen in office on May 03, 2023. Patient is seen sitting up chair, no family present.  Patient is states he has been experiencing severe indigestion for 2 to 3 weeks.  He has been self-medicating with multiple Tums tablets and liquid antacid.  Patient reports having indigestion intermittently for years, but has never been this bad.  Reports past history of reflux.  Significant labs on ED arrival include sodium 130, BUN 94, creatinine 3.32 with GFR 19, calcium 14.2, CK1533, and hemoglobin 11.6.  Patient given calcitonin, calcium 13.5 today.  We have been consulted to assist in management of hypercalcemia.   Objective:  Vital signs in last 24 hours:  Temp:  [97.9 F (36.6 C)-99.1 F (37.3 C)] 98.3 F (36.8 C) (10/02 0752) Pulse Rate:  [73-83] 74 (10/02 0759) Resp:  [16-20] 16 (10/02 0759) BP: (124-160)/(60-79) 124/66 (10/02 0752) SpO2:  [91 %-97 %] 94 % (10/02 0759) Weight:  [137.4 kg] 137.4 kg (10/01 1640)  Weight change:  Filed Weights   05/23/23 1640  Weight: (!) 137.4 kg    Intake/Output: I/O last 3 completed shifts: In: -  Out: 1300 [Urine:1300]   Intake/Output this shift:  Total I/O In: 340.2 [P.O.:240; IV Piggyback:100.2] Out: -   Physical Exam: General: NAD, sitting in chair  Head: Normocephalic, atraumatic. Moist oral mucosal membranes  Eyes: Anicteric  Lungs:  Clear to auscultation  Heart: Regular rate and rhythm  Abdomen:  Soft, nontender,    Extremities: Trace peripheral edema.  Neurologic: Alert and oriented, moving all four extremities  Skin: No lesions  Access: None    Basic Metabolic Panel: Recent Labs  Lab 05/23/23 1329 05/24/23 0533  NA 130* 131*  K 3.7 3.9  CL 88* 92*  CO2 28 29  GLUCOSE 178* 178*  BUN 94* 103*  CREATININE 3.32* 3.23*  CALCIUM 14.2* 13.5*    Liver Function Tests: Recent Labs  Lab 05/23/23 1329  AST 192*  ALT 89*  ALKPHOS 120  BILITOT 0.6  PROT 8.0  ALBUMIN 3.8   No results for input(s): "LIPASE", "AMYLASE" in the last 168 hours. No results for input(s): "AMMONIA" in the last 168 hours.  CBC: Recent Labs  Lab 05/23/23 1329 05/24/23 0533  WBC 15.1* 15.2*  NEUTROABS 11.0*  --   HGB 11.6* 11.0*  HCT 35.4* 33.0*  MCV 93.2 91.4  PLT 285 290    Cardiac Enzymes: Recent Labs  Lab 05/23/23 1329 05/24/23 0533  CKTOTAL 1,533* 801*    BNP: Invalid input(s): "POCBNP"  CBG: Recent Labs  Lab 05/23/23 2159 05/24/23 0926 05/24/23 1218  GLUCAP 139* 165* 138*    Microbiology: Results for orders placed or performed during the hospital encounter of 04/29/23  SARS Coronavirus 2 by RT PCR (hospital order, performed in [facility] hospital lab) *cepheid single result test* Anterior Nasal Swab     Status: None   Collection Time: 04/29/23  5:02 AM   Specimen: Anterior Nasal Swab  Result Value Ref Range Status   SARS Coronavirus 2 by RT PCR NEGATIVE  NEGATIVE Final    Comment: (NOTE) SARS-CoV-2 target nucleic acids are NOT DETECTED.  The SARS-CoV-2 RNA is generally detectable in upper and lower respiratory specimens during the acute phase of infection. The lowest concentration of SARS-CoV-2 viral copies this assay can detect is 250 copies / mL. A negative result does not preclude SARS-CoV-2 infection and should not be used as the sole basis for treatment or other patient management decisions.  A negative result may occur with improper specimen collection / handling,  submission of specimen other than nasopharyngeal swab, presence of viral mutation(s) within the areas targeted by this assay, and inadequate number of viral copies (<250 copies / mL). A negative result must be combined with clinical observations, patient history, and epidemiological information.  Fact Sheet for Patients:   RoadLapTop.co.za  Fact Sheet for Healthcare Providers: http://kim-miller.com/  This test is not yet approved or  cleared by the [place] FDA and has been authorized for detection and/or diagnosis of SARS-CoV-2 by FDA under an Emergency Use Authorization (EUA).  This EUA will remain in effect (meaning this test can be used) for the duration of the COVID-19 declaration under Section 564(b)(1) of the Act, 21 U.S.C. section 360bbb-3(b)(1), unless the authorization is terminated or revoked sooner.  Performed at [facility], [address] [place], [place] [address]     Coagulation Studies: No results for input(s): "LABPROT", "INR" in the last 72 hours.  Urinalysis: No results for input(s): "COLORURINE", "LABSPEC", "PHURINE", "GLUCOSEU", "HGBUR", "BILIRUBINUR", "KETONESUR", "PROTEINUR", "UROBILINOGEN", "NITRITE", "LEUKOCYTESUR" in the last 72 hours.  Invalid input(s): "APPERANCEUR"    Imaging: No results found.   Medications:    sodium chloride 100 mL/hr at 05/24/23 0912   cefTRIAXone (ROCEPHIN)  IV Stopped (05/24/23 0050)   doxycycline (VIBRAMYCIN) IV 100 mg (05/24/23 1046)    allopurinol  100 mg Oral Daily   amiodarone  200 mg Oral Daily   aspirin EC  81 mg Oral Daily   carvedilol  6.25 mg Oral BID WC   Chlorhexidine Gluconate Cloth  6 each Topical Q0600   doxazosin  4 mg Oral QHS   famotidine  20 mg Oral BID   heparin  5,000 Units Subcutaneous Q8H   hydrocerin   Topical Daily   insulin aspart  0-5 Units Subcutaneous QHS   insulin aspart  0-9 Units Subcutaneous TID WC   isosorbide  mononitrate  30 mg Oral Daily   levothyroxine  112 mcg Oral Q0600   loratadine  10 mg Oral Daily   mometasone-formoterol  2 puff Inhalation BID   pantoprazole  40 mg Oral BID   [START ON 05/25/2023] pneumococcal 20-valent conjugate vaccine  0.5 mL Intramuscular Tomorrow-1000   spironolactone  12.5 mg Oral Daily   sucralfate  1 g Oral TID WC & HS   torsemide  40 mg Oral Daily   triamcinolone cream   Topical Q0600   acetaminophen **OR** acetaminophen, alum & mag hydroxide-simeth, diphenhydrAMINE, ipratropium, morphine injection, nitroGLYCERIN, ondansetron **OR** ondansetron (ZOFRAN) IV, senna-docusate  Assessment/ Plan:  Mr. Quirino Kakos is a 72 y.o.  male with past medical history of chronic systolic heart failure, atrial fibrillation on anticoagulation and chronic kidney disease stage IV.  Patient presents to the emergency department with abnormal labs  Patient has been admitted for Hypercalcemia [E83.52] AKI (acute kidney injury) (HCC) [N17.9] Non-traumatic rhabdomyolysis [M62.82]   Acute Kidney Injury with hyponatremia on chronic kidney disease stage IV with baseline creatinine 2.50 and GFR of 27 on 03/20/23.  Acute kidney  injury appears prerenal from poor oral intake. No recent IV contrast exposure.  Sodium 130.  Currently receiving maintenance IV fluids, normal saline at 100 mL/h.  Good urine output noted.  Continue IV fluids for now.  Avoid nephrotoxic agents and therapies.  No acute indication for dialysis.   Lab Results  Component Value Date   CREATININE 3.23 (H) 05/24/2023   CREATININE 3.32 (H) 05/23/2023   CREATININE 2.53 (H) 04/29/2023    Intake/Output Summary (Last 24 hours) at 05/24/2023 1419 Last data filed at 05/24/2023 1119 Gross per 24 hour  Intake 340.17 ml  Output 1300 ml  Net -959.83 ml   2. Secondary Hyperparathyroidism: with outpatient labs: PTH 74, phosphorus 2.7, calcium 9.1 on 77/29/24.   Lab Results  Component Value Date   CALCIUM 13.5 (HH)  05/24/2023   PHOS 4.6 04/21/2023    Hypercalcemia noted on admission, 14.2.  Secondary to increase antacid consumption.  All calcium supplements have been held.  Patient received calcitonin in ED.  Will receive additional dose today.  3. Anemia of chronic kidney disease Lab Results  Component Value Date   HGB 11.0 (L) 05/24/2023    Hemoglobin within desired range.  No need for ESA's at this time.  4. Diabetes mellitus type II with chronic kidney disease/renal manifestations: noninsulin dependent. Most recent hemoglobin A1c is 11.3 on 04/13/23.     LOS: 1 Lemoyne Nestor 10/2/20242:19 PM

## 2023-05-24 NOTE — Progress Notes (Signed)
PROGRESS NOTE    Jason Hudson  WJX:[id number] DOB: 09-27-1950 DOA: 05/23/2023 PCP: Enid Baas, MD    Brief Narrative:  72 year old male with history of staph lugdunensis bacteremia and thoracic discitis, COPD, paroxysmal atrial fibrillation, who presents to the emergency department from ID clinic for chief concerns of acute kidney injury in setting of daptomycin use.   He endorses constipation, worsening in the last few weeks. He reports some baseline constipation for 3 years. He endorses generalized muscle ache.    He endorses chills and denies fever. He endorses nausea, vomiting. He denies trauma and known sick contact. He denies changes to urinary habits. He denies dysuria, hematuria, blood in his stool.    Assessment & Plan:   Principal Problem:   Hypocalcemia Active Problems:   Leukocytosis   Essential hypertension   Paroxysmal atrial fibrillation (HCC)   Obesity, Class III, BMI 40-49.9 (morbid obesity) (HCC)   Obstructive sleep apnea of adult   Acute exacerbation of CHF (congestive heart failure) (HCC)   AKI (acute kidney injury) (HCC)   Hypercholesterolemia   Hyperlipidemia due to type 2 diabetes mellitus (HCC)   Benign hypertensive kidney disease with chronic kidney disease   Type 2 diabetes mellitus (HCC)   Elevated LFTs   Bacteremia   Hypercalcemia Likely in the setting of daptomycin use.  Patient is also been taking large amounts of Tums for reflux symptoms.  Status post 1 dose of calcitonin on arrival to the ED Plan: Normal saline 100 cc/h Defer to nephrology regarding targeted hypercalcemia treatment Daily serum calcium    Leukocytosis Persistent over the last month  Treat per above   Paroxysmal atrial fibrillation (HCC) Amiodarone 200 mg daily Coreg 6.25 twice daily Patient does not appear to be on anticoagulation for unclear reasons   Obesity, Class III, BMI 40-49.9 (morbid obesity) (HCC) This complicates overall care and prognosis.     Bacteremia Staph lugdunensis bacteremia with T5-T6 discitis and osteomyelitis.  Infectious disease consulted on arrival for antimicrobial recommendations   Type 2 diabetes mellitus (HCC) Home glipizide not resumed on admission Insulin SSI with at bedtime coverage, renal dosing ordered Goal inpatient blood glucose levels 140-180   AKI (acute kidney injury) [facility]) Nephrology, Dr. Cherylann Ratel has been consulted Kidney function improving  DVT prophylaxis: SQ heparin Code Status: Full Family Communication: None Disposition Plan: Status is: Inpatient Remains inpatient appropriate because: Hypercalcemia   Level of care: Telemetry Medical  Consultants:  Nephrology  Procedures:  None  Antimicrobials: Rocephin   Subjective: Seen and examined.  Resting in bed.  Reports improvement in symptoms since admission.  Objective: Vitals:   05/23/23 2141 05/24/23 0419 05/24/23 0752 05/24/23 0759  BP: (!) 160/79 127/62 124/66   Pulse: 83 73 74 74  Resp: 18 18 18 16   Temp: 99.1 F (37.3 C) 97.9 F (36.6 C) 98.3 F (36.8 C)   TempSrc: Oral Oral    SpO2: 95% 94% 97% 94%  Weight:      Height:        Intake/Output Summary (Last 24 hours) at 05/24/2023 1336 Last data filed at 05/24/2023 1119 Gross per 24 hour  Intake 340.17 ml  Output 1300 ml  Net -959.83 ml   Filed Weights   05/23/23 1640  Weight: (!) 137.4 kg    Examination:  General exam: Appears calm and comfortable  Respiratory system: Clear to auscultation. Respiratory effort normal. Cardiovascular system: S1-2, RRR, no murmurs, no pedal edema Gastrointestinal system: Obese, soft, NT/ND, normal bowel sounds Central nervous system:  Alert and oriented. No focal neurological deficits. Extremities: Symmetric 5 x 5 power. Skin: No rashes, lesions or ulcers Psychiatry: Judgement and insight appear normal. Mood & affect appropriate.     Data Reviewed: I have personally reviewed following labs and imaging  studies  CBC: Recent Labs  Lab 05/23/23 1329 05/24/23 0533  WBC 15.1* 15.2*  NEUTROABS 11.0*  --   HGB 11.6* 11.0*  HCT 35.4* 33.0*  MCV 93.2 91.4  PLT 285 290   Basic Metabolic Panel: Recent Labs  Lab 05/23/23 1329 05/24/23 0533  NA 130* 131*  K 3.7 3.9  CL 88* 92*  CO2 28 29  GLUCOSE 178* 178*  BUN 94* 103*  CREATININE 3.32* 3.23*  CALCIUM 14.2* 13.5*   GFR: Estimated Creatinine Clearance: 29.3 mL/min (A) (by C-G formula based on SCr of 3.23 mg/dL (H)). Liver Function Tests: Recent Labs  Lab 05/23/23 1329  AST 192*  ALT 89*  ALKPHOS 120  BILITOT 0.6  PROT 8.0  ALBUMIN 3.8   No results for input(s): "LIPASE", "AMYLASE" in the last 168 hours. No results for input(s): "AMMONIA" in the last 168 hours. Coagulation Profile: No results for input(s): "INR", "PROTIME" in the last 168 hours. Cardiac Enzymes: Recent Labs  Lab 05/23/23 1329 05/24/23 0533  CKTOTAL 1,533* 801*   BNP (last 3 results) No results for input(s): "PROBNP" in the last 8760 hours. HbA1C: No results for input(s): "HGBA1C" in the last 72 hours. CBG: Recent Labs  Lab 05/23/23 2159 05/24/23 0926 05/24/23 1218  GLUCAP 139* 165* 138*   Lipid Profile: No results for input(s): "CHOL", "HDL", "LDLCALC", "TRIG", "CHOLHDL", "LDLDIRECT" in the last 72 hours. Thyroid Function Tests: No results for input(s): "TSH", "T4TOTAL", "FREET4", "T3FREE", "THYROIDAB" in the last 72 hours. Anemia Panel: No results for input(s): "VITAMINB12", "FOLATE", "FERRITIN", "TIBC", "IRON", "RETICCTPCT" in the last 72 hours. Sepsis Labs: No results for input(s): "PROCALCITON", "LATICACIDVEN" in the last 168 hours.  No results found for this or any previous visit (from the past 240 hour(s)).       Radiology Studies: No results found.      Scheduled Meds:  allopurinol  100 mg Oral Daily   amiodarone  200 mg Oral Daily   aspirin EC  81 mg Oral Daily   calcitonin  400 Units Subcutaneous STAT    carvedilol  6.25 mg Oral BID WC   Chlorhexidine Gluconate Cloth  6 each Topical Q0600   doxazosin  4 mg Oral QHS   famotidine  20 mg Oral BID   heparin  5,000 Units Subcutaneous Q8H   hydrocerin   Topical Daily   insulin aspart  0-5 Units Subcutaneous QHS   insulin aspart  0-9 Units Subcutaneous TID WC   isosorbide mononitrate  30 mg Oral Daily   levothyroxine  112 mcg Oral Q0600   loratadine  10 mg Oral Daily   mometasone-formoterol  2 puff Inhalation BID   pantoprazole  40 mg Oral BID   [START ON 05/25/2023] pneumococcal 20-valent conjugate vaccine  0.5 mL Intramuscular Tomorrow-1000   spironolactone  12.5 mg Oral Daily   torsemide  40 mg Oral Daily   triamcinolone cream   Topical Q0600   Continuous Infusions:  sodium chloride 100 mL/hr at 05/24/23 0912   cefTRIAXone (ROCEPHIN)  IV Stopped (05/24/23 0050)   doxycycline (VIBRAMYCIN) IV 100 mg (05/24/23 1046)     LOS: 1 day      Tresa Moore, MD [facility]   If 7PM-7AM, please contact night-coverage  05/24/2023, 1:36 PM

## 2023-05-24 NOTE — Consult Note (Signed)
WOC Nurse Consult Note: patient has longstanding history of lymphedema, has never been seen at a lymphedema clinic, says his significant other wraps in Ace bandages at home and uses Neosporin on any ulcers that appear  Patient states he can not elevate his legs due to his recliner being broken  Reason for Consult: chronic leg wounds  Wound type: full thickness r/t venous insufficiency/lymphedema  Pressure Injury POA: NA  Measurement: R lateral lower leg 2 cm x 2 cm 100% dry yellow eschar present; L medial lower leg noted 1 cm x 1 cm 100% dry yellow eschar  Wound bed: as above, patient states legs weep at times, are dry at the time of this visit  Drainage (amount, consistency, odor)  dry  Periwound: legs with non-pitting edema, hemosiderin staining and changes consistent with lymphedema (lymphatic papillomatosis)  Dressing procedure/placement/frequency: Cleanse B legs with soap and water, dry and apply Eucerin daily to legs and feet. DO NOT PLACE IN BETWEEN TOES.  After applying Eucerin, cover R lateral leg ulcer with a small piece of silver hydrofiber cut to fit wound bed [facility] [phone]), cover with ABD pad and wrap both legs with Kerlix beginning just above toes and ending right below knees.  May cover with Ace bandage wrapped in same fashion as Kerlix for some light compression.   Patient would benefit from referral to wound care clinic/lymphedema clinic for ongoing management of lower legs.   Lymphedema  Resources (updated 06/2021) Each site requires a referral from your primary care MD [facility] [address] [place], [place]  [phone] (Upper extremities)  [address] [place], [place] [phone] (Lower extremities, PATIENT CAN NOT HAVE A WOUND)  [facility] [address] [place], [place] [address] [phone]  [facility] [address] Medical Office Building  [address]  [place], [place] [phone]  [facility] [address] [place], [place] [address] [phone]  [facility] [address] [place], [place] [address] [phone] [facility] ([facility]) [phone] [address] [place], [place] [address] [phone]   POC discussed with primary MD and bedside nurse. WOC team will not follow at this time. Re-consult if further needs arise.   Thank you,    Priscella Mann MSN, RN-BC, [employer] [phone]

## 2023-05-24 NOTE — Progress Notes (Signed)
Pt noted with critical calcium 13.5 decreased from admission 14.2. Also pt noted with dry, flaky, reddened edematous legs with old ruptured blisters to left lower leg. May we have a wound care consult for recommendations of tx. Pt states he just "wraps at home".  Dr. Georgeann Oppenheim made aware.

## 2023-05-24 NOTE — Evaluation (Signed)
Physical Therapy Evaluation Patient Details Name: Jason Hudson MRN: [id number] DOB: Jan 19, 1951 Today's Date: 05/24/2023  History of Present Illness  Pt is a 72 year old male presenting to the ED from ID clinic, admitted with hypercalcemia;  PMH significant for Leukocytosis, Essential hypertension, Paroxysmal atrial fibrillation (HCC), Obesity, Class III, BMI 40-49.9 (morbid obesity) (HCC), Obstructive sleep apnea of adult, Acute exacerbation of CHF (congestive heart failure) (HCC),AKI (acute kidney injury) (HCC),Hypercholesterolemia, Hyperlipidemia due to type 2 diabetes mellitus (HCC), Benign hypertensive kidney disease with chronic kidney disease,Type 2 diabetes mellitus (HCC),Elevated LFTs, Bacteremia  Clinical Impression  Pt is received in recliner, he is agreeable to PT session. At baseline, Pt reports amb short community distances/household amb, requiring assistance with ADL/IADLs, using 2-3L [place] with high exertion but otherwise RA, and sleeping in recliner due to COPD dx. VSS throughout session with SpO2 98-100% throughout. Pt performs transfers close sup A and amb CGA for safety. Pt able to amb approx 95 ft using RW and a chair follow for safety. At end of amb activity, Pt reports 7/10 modified RPE with SpO2 98% RA. Educated Pt on using RW during amb activities instead of furniture walking to prevent fall risk. Pt would benefit from skilled PT to address above deficits and promote optimal return to PLOF.      If plan is discharge home, recommend the following: A little help with walking and/or transfers;A little help with bathing/dressing/bathroom;Assist for transportation;Help with stairs or ramp for entrance   Can travel by private vehicle        Equipment Recommendations Rolling walker (2 wheels)  Recommendations for Other Services       Functional Status Assessment Patient has had a recent decline in their functional status and demonstrates the ability to make significant  improvements in function in a reasonable and predictable amount of time.     Precautions / Restrictions Precautions Precautions: Fall Restrictions Weight Bearing Restrictions: No      Mobility  Bed Mobility               General bed mobility comments: NT in recliner pre/post session; sleeps in recliner at baseline    Transfers Overall transfer level: Needs assistance Equipment used: Rolling walker (2 wheels) Transfers: Sit to/from Stand Sit to Stand: Supervision           General transfer comment: able to perform STS from recliner close sup A for safety    Ambulation/Gait Ambulation/Gait assistance: Contact guard assist Gait Distance (Feet): 95 Feet Assistive device: Rolling walker (2 wheels) Gait Pattern/deviations: Step-through pattern, Trunk flexed, Wide base of support, Decreased stride length Gait velocity: slightly decreased     General Gait Details: able to amb approx 95 ft using RW CGA for safety; no reports of SOB  Stairs            Wheelchair Mobility     Tilt Bed    Modified Rankin (Stroke Patients Only)       Balance Overall balance assessment: Needs assistance Sitting-balance support: Feet supported Sitting balance-Leahy Scale: Good Sitting balance - Comments: able to maintain seated edge of seat balance during functional activities   Standing balance support: Bilateral upper extremity supported, During functional activity, Reliant on assistive device for balance Standing balance-Leahy Scale: Fair Standing balance comment: able to maintain static standing balance during functional activities  Pertinent Vitals/Pain Pain Assessment Pain Assessment: No/denies pain    Home Living Family/patient expects to be discharged to:: Private residence Living Arrangements: Spouse/significant other Available Help at Discharge: Family;Available 24 hours/day Type of Home: Mobile home Home Access:  Stairs to enter Entrance Stairs-Rails: Right;Left;Can reach both Entrance Stairs-Number of Steps: 4   Home Layout: One level Home Equipment: Cane - single point;Standard Walker      Prior Function Prior Level of Function : Independent/Modified Independent;Needs assist       Physical Assist : ADLs (physical);Mobility (physical) Mobility (physical): Gait   Mobility Comments: amb with no AD household/short community distances after rehab, up until about a week ago; Pt reports occasionally furniture walking ADLs Comments: assist for LB dressing and bathing (sponge baths currently); MOD I in feeding, grooming, toileting; pt reports he is currently driving, partner assists with cooking/cleaning     Extremity/Trunk Assessment   Upper Extremity Assessment Upper Extremity Assessment: Defer to OT evaluation RUE Deficits / Details: B baseeline rotator cuff injuries per pt report; shoulder flexion to approx 80 degrees LUE Deficits / Details: B baseeline rotator cuff injuries per pt report; shoulder flexion to approx 80 degrees    Lower Extremity Assessment Lower Extremity Assessment: Generalized weakness    Cervical / Trunk Assessment Cervical / Trunk Assessment: Normal  Communication   Communication Communication: No apparent difficulties Cueing Techniques: Verbal cues  Cognition Arousal: Alert Behavior During Therapy: WFL for tasks assessed/performed Overall Cognitive Status: Within Functional Limits for tasks assessed                                 General Comments: AO x4; pleasant and cooperative during session        General Comments General comments (skin integrity, edema, etc.): BLE wrapped due to BLE edema; VSS throughout session with SpO2 98-100% with mobility    Exercises Other Exercises Other Exercises: Educated about risk with furniture walking and using RW for safety   Assessment/Plan    PT Assessment Patient needs continued PT services  PT  Problem List Decreased mobility;Decreased activity tolerance;Decreased balance;Decreased range of motion       PT Treatment Interventions DME instruction;Gait training;Stair training;Functional mobility training;Therapeutic activities;Therapeutic exercise;Balance training    PT Goals (Current goals can be found in the Care Plan section)  Acute Rehab PT Goals Patient Stated Goal: to go home PT Goal Formulation: With patient Time For Goal Achievement: 06/07/23 Potential to Achieve Goals: Good    Frequency Min 1X/week     Co-evaluation               AM-PAC PT "6 Clicks" Mobility  Outcome Measure Help needed turning from your back to your side while in a flat bed without using bedrails?: A Little Help needed moving from lying on your back to sitting on the side of a flat bed without using bedrails?: A Little Help needed moving to and from a bed to a chair (including a wheelchair)?: A Little Help needed standing up from a chair using your arms (e.g., wheelchair or bedside chair)?: A Little Help needed to walk in hospital room?: A Little Help needed climbing 3-5 steps with a railing? : A Lot 6 Click Score: 17    End of Session   Activity Tolerance: Patient tolerated treatment well Patient left: in chair;with call bell/phone within reach;with chair alarm set Nurse Communication: Mobility status PT Visit Diagnosis: Unsteadiness on feet (R26.81);Muscle weakness (generalized) (  M62.81)    Time: 1610-9604 PT Time Calculation (min) (ACUTE ONLY): 12 min   Charges:                 Elmon Else, SPT   Sybil Shrader 05/24/2023, 2:01 PM

## 2023-05-24 NOTE — Consult Note (Signed)
NAME: Jason Hudson  DOB: Sep 10, 1950  MRN: [id number]  Date/Time: 05/24/2023 5:44 PM  REQUESTING PROVIDER: Dr.Cox Subjective:  REASON FOR CONSULT: antibiotic management for discitis  ? Jason Hudson is a 72 y.o. with a history of recent hospitalization for staph lugdunensis bacteremia and Thoracic discitis  CKD, COPD, PAF, , he was discharged on Iv daptomycin ( adjusted to Crcl) and ceftriaxone for 6 weeks to complete on 05/30/23. He was seen in my office yesterday and as he was weak and feeling wobbly and heavy in his legs , labs were drawn and it showed ca of 14, cr of 3.35 and ck of 1500. HE was sent to the ED for admission  05/23/23  BP 160/79 !  Temp 99.1 F (37.3 C)  Pulse Rate 83  Resp 18  SpO2 95 %    Latest Reference Range & Units 05/23/23  WBC 4.0 - 10.5 K/uL 15.1 (H)  Hemoglobin 13.0 - 17.0 g/dL 40.9 (L)  HCT 81.1 - 91.4 % 35.4 (L)  Platelets 150 - 400 K/uL 285  Creatinine 0.61 - 1.24 mg/dL 7.82 (H)   Daptomycin was stopped HE received IV fluids, calcitonin and diuretic HE was taking OTC calcium antacids to counter indigestion I am asked to see patient for antibiotic in place of Doxy    Past Medical History:  Diagnosis Date   CHF (congestive heart failure) (HCC)    COPD (chronic obstructive pulmonary disease) (HCC)    Diabetes mellitus without complication (HCC)    Edema    leg swelling   Gastroesophageal reflux disease 12/15/2017   Gout    Hypertension    Melena 11/05/2017   Pneumonia 07/27/2016    Past Surgical History:  Procedure Laterality Date   CENTRAL LINE INSERTION-TUNNELED Hudson/A 04/21/2023   Procedure: CENTRAL LINE INSERTION-TUNNELED;  Surgeon: Jason Crease Latina Craver, MD;  Location: [facility] INVASIVE CV LAB;  Service: Cardiovascular;  Laterality: Hudson/A;   COLONOSCOPY WITH PROPOFOL Hudson/A 11/07/2017   Procedure: COLONOSCOPY WITH PROPOFOL;  Surgeon: Jason Mood, MD;  Location: [facility] ENDOSCOPY;  Service: Gastroenterology;  Laterality: Hudson/A;    ESOPHAGOGASTRODUODENOSCOPY (EGD) WITH PROPOFOL Hudson/A 11/07/2017   Procedure: ESOPHAGOGASTRODUODENOSCOPY (EGD) WITH PROPOFOL;  Surgeon: Jason Mood, MD;  Location: [facility] ENDOSCOPY;  Service: Gastroenterology;  Laterality: Hudson/A;   LEFT HEART CATH AND CORONARY ANGIOGRAPHY Hudson/A 10/23/2017   Procedure: LEFT HEART CATH AND CORONARY ANGIOGRAPHY;  Surgeon: Jason Millard, MD;  Location: [facility] INVASIVE CV LAB;  Service: Cardiovascular;  Laterality: Hudson/A;    Social History   Socioeconomic History   Marital status: Single    Spouse name: Not on file   Number of children: Not on file   Years of education: Not on file   Highest education level: Not on file  Occupational History   Not on file  Tobacco Use   Smoking status: Never   Smokeless tobacco: Never  Substance and Sexual Activity   Alcohol use: Not Currently    Comment: ocassionally   Drug use: Never   Sexual activity: Not Currently  Other Topics Concern   Not on file  Social History Narrative   Not on file   Social Determinants of Health   Financial Resource Strain: Low Risk  (05/17/2023)   Received from [facility]   Overall Financial Resource Strain (CARDIA)    Difficulty of Paying Living Expenses: Not hard at all  Food Insecurity: No Food Insecurity (05/23/2023)   Hunger Vital Sign    Worried About Running Out of Food in the  Last Year: Never true    Ran Out of Food in the Last Year: Never true  Transportation Needs: No Transportation Needs (05/23/2023)   PRAPARE - Administrator, Civil Service (Medical): No    Lack of Transportation (Non-Medical): No  Physical Activity: Not on file  Stress: Not on file  Social Connections: Not on file  Intimate Partner Violence: Not At Risk (05/23/2023)   Humiliation, Afraid, Rape, and Kick questionnaire    Fear of Current or Ex-Partner: No    Emotionally Abused: No    Physically Abused: No    Sexually Abused: No    Family History  Problem Relation Age of Onset    Cancer Mother    CVA Father    Heart disease Father    Allergies  Allergen Reactions   Atorvastatin Other (See Comments)    Other reaction(s): Not available Other reaction(s): Other (See Comments)    Red Dye #40 (Allura Red) Other (See Comments)    "not as allergic to it as I used to be. I can take a little of it"   I? Current Facility-Administered Medications  Medication Dose Route Frequency Provider Last Rate Last Admin   0.9 %  sodium chloride infusion   Intravenous Continuous Jason Patella B, MD 100 mL/hr at 05/24/23 0912 New Bag at 05/24/23 0912   acetaminophen (TYLENOL) tablet 650 mg  650 mg Oral Q6H PRN Hudson, Jason N, DO   650 mg at 05/24/23 1640   Or   acetaminophen (TYLENOL) suppository 650 mg  650 mg Rectal Q6H PRN Hudson, Jason N, DO       allopurinol (ZYLOPRIM) tablet 100 mg  100 mg Oral Daily Hudson, Jason N, DO   100 mg at 05/24/23 0914   alum & mag hydroxide-simeth (MAALOX/MYLANTA) 200-200-20 MG/5ML suspension 30 mL  30 mL Oral Q4H PRN Jason Patella B, MD   30 mL at 05/24/23 1213   amiodarone (PACERONE) tablet 200 mg  200 mg Oral Daily Hudson, Jason N, DO   200 mg at 05/24/23 4401   aspirin EC tablet 81 mg  81 mg Oral Daily Hudson, Jason N, DO   81 mg at 05/24/23 0914   cefTRIAXone (ROCEPHIN) 2 g in sodium chloride 0.9 % 100 mL IVPB  2 g Intravenous QHS Jason Hudson, RPH   Stopped at 05/24/23 0050   Chlorhexidine Gluconate Cloth 2 % PADS 6 each  6 each Topical Q0600 Hudson, Jason N, DO   6 each at 05/24/23 0532   diphenhydrAMINE (BENADRYL) capsule 25 mg  25 mg Oral Q6H PRN Hudson, Jason N, DO   25 mg at 05/24/23 0551   doxazosin (CARDURA) tablet 4 mg  4 mg Oral QHS Hudson, Jason N, DO   4 mg at 05/24/23 0020   doxycycline (VIBRAMYCIN) 100 mg in sodium chloride 0.9 % 250 mL IVPB  100 mg Intravenous Q12H Jason Hudson, Jason Albright, MD 125 mL/hr at 05/24/23 1046 100 mg at 05/24/23 1046   famotidine (PEPCID) tablet 20 mg  20 mg Oral BID Jason Patella B, MD   20 mg at 05/24/23 1044   heparin injection  5,000 Units  5,000 Units Subcutaneous Q8H Hudson, Jason N, DO   5,000 Units at 05/24/23 1214   hydrocerin (EUCERIN) cream   Topical Daily Sreenath, Sudheer B, MD       insulin aspart (novoLOG) injection 0-5 Units  0-5 Units Subcutaneous QHS Hudson, Jason N, DO       insulin aspart (novoLOG) injection  0-9 Units  0-9 Units Subcutaneous TID WC Hudson, Jason N, DO   1 Units at 05/24/23 1707   ipratropium (ATROVENT) nebulizer solution 0.5 mg  0.5 mg Nebulization Q6H PRN Hudson, Jason N, DO   0.5 mg at 05/24/23 1643   isosorbide mononitrate (IMDUR) 24 hr tablet 30 mg  30 mg Oral Daily Hudson, Jason N, DO   30 mg at 05/24/23 0981   levothyroxine (SYNTHROID) tablet 112 mcg  112 mcg Oral Q0600 Hudson, Jason N, DO   112 mcg at 05/24/23 0523   loratadine (CLARITIN) tablet 10 mg  10 mg Oral Daily Hudson, Jason N, DO   10 mg at 05/24/23 0914   mometasone-formoterol (DULERA) 200-5 MCG/ACT inhaler 2 puff  2 puff Inhalation BID Hudson, Jason N, DO   2 puff at 05/24/23 0917   multivitamin with minerals tablet 1 tablet  1 tablet Oral Daily Jason Patella B, MD   1 tablet at 05/24/23 1640   nitroGLYCERIN (NITROSTAT) SL tablet 0.4 mg  0.4 mg Sublingual Q5 min PRN Hudson, Jason N, DO       ondansetron (ZOFRAN) tablet 4 mg  4 mg Oral Q6H PRN Hudson, Jason N, DO       Or   ondansetron (ZOFRAN) injection 4 mg  4 mg Intravenous Q6H PRN Hudson, Jason N, DO       pantoprazole (PROTONIX) EC tablet 40 mg  40 mg Oral BID Georgeann Oppenheim, Sudheer B, MD       [START ON 05/25/2023] pneumococcal 20-valent conjugate vaccine (PREVNAR 20) injection 0.5 mL  0.5 mL Intramuscular Tomorrow-1000 Sreenath, Sudheer B, MD       protein supplement (ENSURE MAX) liquid  11 oz Oral Daily Georgeann Oppenheim, Sudheer B, MD   11 oz at 05/24/23 1640   senna-docusate (Senokot-S) tablet 1 tablet  1 tablet Oral QHS PRN Hudson, Jason N, DO       [START ON 05/25/2023] spironolactone (ALDACTONE) tablet 25 mg  25 mg Oral Daily Burnis Medin D, RPH       sucralfate (CARAFATE) 1 GM/10ML suspension 1 g  1 g Oral TID WC & HS Sreenath,  Sudheer B, MD   1 g at 05/24/23 1608   [START ON 05/25/2023] torsemide (DEMADEX) tablet 80 mg  80 mg Oral Daily Lowella Bandy, RPH       triamcinolone cream (KENALOG) 0.1 % cream   Topical Q0600 Sreenath, Sudheer B, MD       Facility-Administered Medications Ordered in Other Encounters  Medication Dose Route Frequency Provider Last Rate Last Admin   Heparin (Porcine) in NaCl 1000-0.9 UT/500ML-% SOLN    PRN Schnier, Latina Craver, MD   500 mL at 04/21/23 1611   lidocaine-EPINEPHrine (PF) (XYLOCAINE-EPINEPHrine) 1 %-1:200000 (PF) injection    PRN Schnier, Latina Craver, MD   20 mL at 04/21/23 1611     Abtx:  Anti-infectives (From admission, onward)    Start     Dose/Rate Route Frequency Ordered Stop   05/24/23 1000  doxycycline (VIBRAMYCIN) 100 mg in sodium chloride 0.9 % 250 mL IVPB        100 mg 125 mL/hr over 120 Minutes Intravenous Every 12 hours 05/24/23 0911     05/24/23 0030  cefTRIAXone (ROCEPHIN) 2 g in sodium chloride 0.9 % 100 mL IVPB        2 g 200 mL/hr over 30 Minutes Intravenous Daily at bedtime 05/23/23 2342 05/31/23 2159   05/23/23 2315  cefTRIAXone (ROCEPHIN) IVPB  Status:  Discontinued  Note to Pharmacy: Indication:  Vertebral osteomyelitis First Dose: Yes Last Day of Therapy:  05/30/23 Labs - Once weekly: CBC with differential, CK, CMP, CRP, ESR Monitor for rhabomyolysis or eosinophilia Fax weekly lab results  promptly to [phone]  Please leave central line  place until doctor ( vascula   2 g Intravenous Every 24 hours 05/23/23 2224 05/23/23 2342       REVIEW OF SYSTEMS:  Const: negative fever, negative chills, negative weight loss Eyes: negative diplopia or visual changes, negative eye pain ENT: negative coryza, negative sore throat Resp: negative cough, hemoptysis, dyspnea Cards: negative for chest pain, palpitations, lower extremity edema GU: negative for frequency, dysuria and hematuria GI: Negative for abdominal pain, diarrhea, bleeding, ++  constipation Skin: negative for rash and pruritus Heme: negative for easy bruising and gum/nose bleeding MS: weakness, feeling wobbly Neurolo:negative for headaches, dizziness, vertigo, memory problems  Psych: negative for feelings of anxiety, depression  Endocrine: negative for thyroid, diabetes Allergy/Immunology- negative for any medication or food allergies ? Pertinent Positives include : Objective:  VITALS:  BP 131/68 (BP Location: Right Arm)   Pulse 74   Temp 97.8 F (36.6 C) (Axillary)   Resp 17   Ht 5\' 9"  (1.753 m)   Wt (!) 137.4 kg   SpO2 96%   BMI 44.75 kg/m  LDA Foley Central line Other drainage tubes PHYSICAL EXAM:  General: Alert, cooperative, no distress, appears stated age.  Head: Normocephalic, without obvious abnormality, atraumatic. Eyes: Conjunctivae clear, anicteric sclerae. Pupils are equal ENT Nares normal. No drainage or sinus tenderness. Lips, mucosa, and tongue normal. No Thrush Neck: Supple, symmetrical, no adenopathy, thyroid: non tender no carotid bruit and no JVD. Back: No CVA tenderness. Lungs: Clear to auscultation bilaterally. No Wheezing or Rhonchi. No rales. Heart: Regular rate and rhythm, no murmur, rub or gallop. Abdomen: Soft, non-tender,not distended. Bowel sounds normal. No masses Extremities: atraumatic, no cyanosis. No edema. No clubbing Skin: No rashes or lesions. Or bruising Lymph: Cervical, supraclavicular normal. Neurologic: Grossly non-focal Pertinent Labs Lab Results CBC    Component Value Date/Time   WBC 15.2 (H) 05/24/2023 0533   RBC 3.61 (L) 05/24/2023 0533   HGB 11.0 (L) 05/24/2023 0533   HGB 11.6 (L) 10/21/2020 1102   HCT 33.0 (L) 05/24/2023 0533   HCT 35.9 (L) 10/21/2020 1102   PLT 290 05/24/2023 0533   PLT 311 10/21/2020 1102   MCV 91.4 05/24/2023 0533   MCV 91 10/21/2020 1102   MCH 30.5 05/24/2023 0533   MCHC 33.3 05/24/2023 0533   RDW 14.5 05/24/2023 0533   RDW 16.3 (H) 10/21/2020 1102   LYMPHSABS 2.2  05/23/2023 1329   LYMPHSABS 1.7 10/21/2020 1102   MONOABS 1.2 (H) 05/23/2023 1329   EOSABS 0.5 05/23/2023 1329   EOSABS 0.3 10/21/2020 1102   BASOSABS 0.1 05/23/2023 1329   BASOSABS 0.1 10/21/2020 1102       Latest Ref Rng & Units 05/24/2023    5:33 AM 05/23/2023    1:29 PM 04/29/2023    4:40 AM  CMP  Glucose 70 - 99 mg/dL 272  536  644   BUN 8 - 23 mg/dL 034  94  81   Creatinine 0.61 - 1.24 mg/dL 7.42  5.95  6.38   Sodium 135 - 145 mmol/L 131  130  128   Potassium 3.5 - 5.1 mmol/L 3.9  3.7  4.8   Chloride 98 - 111 mmol/L 92  88  91   CO2 22 -  32 mmol/L 29  28  26    Calcium 8.9 - 10.3 mg/dL 16.1  09.6  8.9   Total Protein 6.5 - 8.1 g/dL  8.0  7.6   Total Bilirubin 0.3 - 1.2 mg/dL  0.6  1.0   Alkaline Phos 38 - 126 U/L  120  106   AST 15 - 41 U/L  192  31   ALT 0 - 44 U/L  89  18       Microbiology: No results found for this or any previous visit (from the past 240 hour(s)).  IMAGING RESULTS: I have personally reviewed the films ? Impression/Recommendation ?Hypercalcemia- - multifactorial- Excess OTC calcium for heart burn, CKD May have underlying other etiology- followed by renal Being corrected  AKI on CKD  Rhabdomylolysis - Daptomycin discontinued, on Doxy IV Recent staph lugdunesis bacteremia? Recent thoracic discitis- has been on dapto and ceftriaxone- He will complete 6 weeks on 05/30/23 It should be okay to switch him to PO Doxy and Cefadroxil 1 gram PO BID on discharge for 4 more weeks   ___________________________________________________ Discussed with patient, and hospitalist Note:  This document was prepared using Dragon voice recognition software and may include unintentional dictation errors.

## 2023-05-25 LAB — COMPREHENSIVE METABOLIC PANEL
ALT: 76 U/L — ABNORMAL HIGH (ref 0–44)
AST: 112 U/L — ABNORMAL HIGH (ref 15–41)
Albumin: 3.3 g/dL — ABNORMAL LOW (ref 3.5–5.0)
Alkaline Phosphatase: 90 U/L (ref 38–126)
Anion gap: 12 (ref 5–15)
BUN: 110 mg/dL — ABNORMAL HIGH (ref 8–23)
CO2: 24 mmol/L (ref 22–32)
Calcium: 11.8 mg/dL — ABNORMAL HIGH (ref 8.9–10.3)
Chloride: 95 mmol/L — ABNORMAL LOW (ref 98–111)
Creatinine, Ser: 3.37 mg/dL — ABNORMAL HIGH (ref 0.61–1.24)
GFR, Estimated: 19 mL/min — ABNORMAL LOW (ref >60)
Glucose, Bld: 164 mg/dL — ABNORMAL HIGH (ref 70–99)
Potassium: 3.6 mmol/L (ref 3.5–5.1)
Sodium: 131 mmol/L — ABNORMAL LOW (ref 135–145)
Total Bilirubin: 0.4 mg/dL (ref 0.3–1.2)
Total Protein: 7.3 g/dL (ref 6.5–8.1)

## 2023-05-25 LAB — GLUCOSE, CAPILLARY
Glucose-Capillary: 131 mg/dL — ABNORMAL HIGH (ref 70–99)
Glucose-Capillary: 176 mg/dL — ABNORMAL HIGH (ref 70–99)
Glucose-Capillary: 145 mg/dL — ABNORMAL HIGH (ref 70–99)
Glucose-Capillary: 190 mg/dL — ABNORMAL HIGH (ref 70–99)

## 2023-05-25 MED ORDER — CALCITONIN (SALMON) 200 UNIT/ML IJ SOLN
400.0000 [IU] | INTRAMUSCULAR | Status: AC
  Administered 2023-05-25: 400 [IU] via SUBCUTANEOUS
  Filled 2023-05-25: qty 2

## 2023-05-25 NOTE — Plan of Care (Signed)
Problem: Coping: Goal: Ability to adjust to condition or change in health will improve Outcome: Progressing   Problem: Fluid Volume: Goal: Ability to maintain a balanced intake and output will improve Outcome: Progressing

## 2023-05-25 NOTE — Progress Notes (Addendum)
Thoracic discitis On day 38 of IV antibiotic On discharge  central line to be removed will go on PO Doxy 100mg  BID and cefadroxil 500mg  PO BID for 30 days Will see  him as OP ( appt given)

## 2023-05-25 NOTE — H&P (View-Only) (Signed)
Thoracic discitis On day 38 of IV antibiotic On discharge  central line to be removed will go on PO Doxy 100mg  BID and cefadroxil 500mg  PO BID for 30 days Will see  him as OP ( appt given)

## 2023-05-25 NOTE — Plan of Care (Signed)

## 2023-05-25 NOTE — Progress Notes (Signed)
Patient had medium BM. Says he still is "backed up" and does not want his dressings changed at this time.

## 2023-05-25 NOTE — Progress Notes (Signed)
PROGRESS NOTE    Jason Hudson  ION:[id number] DOB: [birth year] DOA: 05/23/2023 PCP: Enid Baas, MD    Brief Narrative:  72 year old male with history of staph lugdunensis bacteremia and thoracic discitis, COPD, paroxysmal atrial fibrillation, who presents to the emergency department from ID clinic for chief concerns of acute kidney injury in setting of daptomycin use.   He endorses constipation, worsening in the last few weeks. He reports some baseline constipation for 3 years. He endorses generalized muscle ache.    He endorses chills and denies fever. He endorses nausea, vomiting. He denies trauma and known sick contact. He denies changes to urinary habits. He denies dysuria, hematuria, blood in his stool.    Assessment & Plan:   Principal Problem:   Hypocalcemia Active Problems:   Leukocytosis   Essential hypertension   Paroxysmal atrial fibrillation (HCC)   Obesity, Class III, BMI 40-49.9 (morbid obesity) (HCC)   Obstructive sleep apnea of adult   Acute exacerbation of CHF (congestive heart failure) (HCC)   AKI (acute kidney injury) (HCC)   Hypercholesterolemia   Hyperlipidemia due to type 2 diabetes mellitus (HCC)   Benign hypertensive kidney disease with chronic kidney disease   Type 2 diabetes mellitus (HCC)   Elevated LFTs   Bacteremia   Hypercalcemia Likely in the setting of daptomycin use.  Patient is also been taking large amounts of Tums for reflux symptoms.  Status post 1 dose of calcitonin on arrival to the ED.  Patient received another dose of calcitonin Plan: Normal saline 100 cc/h Repeat calcitonin Daily serum calcium    Leukocytosis Persistent over the last month  Treat per above   Paroxysmal atrial fibrillation (HCC) Amiodarone 200 mg daily Coreg 6.25 twice daily Patient does not appear to be on anticoagulation for unclear reasons   Obesity, Class III, BMI 40-49.9 (morbid obesity) (HCC) This complicates overall care and  prognosis.    Bacteremia Staph lugdunensis bacteremia with T5-T6 discitis and osteomyelitis.  Infectious disease consulted on arrival for antimicrobial recommendations Discontinue IV daptomycin Continue ceftriaxone while in house On discharge switch to p.o. doxycycline and p.o. cefadroxil for 4 more weeks   Type 2 diabetes mellitus (HCC) Home glipizide not resumed on admission Insulin SSI with at bedtime coverage, renal dosing ordered Goal inpatient blood glucose levels 140-180   AKI (acute kidney injury) [facility]) Nephrology, Dr. Cherylann Ratel has been consulted Kidney function improving  DVT prophylaxis: SQ heparin Code Status: Full Family Communication: None Disposition Plan: Status is: Inpatient Remains inpatient appropriate because: Hypercalcemia   Level of care: Telemetry Medical  Consultants:  Nephrology  Procedures:  None  Antimicrobials: Rocephin   Subjective: Seen and examined.  Sitting up in chair.  Reports improvement over interval  Objective: Vitals:   05/24/23 1640 05/24/23 2027 05/25/23 0345 05/25/23 0742  BP:  133/70 130/70 (!) 143/69  Pulse:  75 72 74  Resp: 17 18 18 16   Temp:  98.2 F (36.8 C) 98.2 F (36.8 C) 97.9 F (36.6 C)  TempSrc:  Oral Oral   SpO2:  94% 97% 100%  Weight:      Height:        Intake/Output Summary (Last 24 hours) at 05/25/2023 1320 Last data filed at 05/25/2023 0900 Gross per 24 hour  Intake 1302.04 ml  Output --  Net 1302.04 ml   Filed Weights   05/23/23 1640  Weight: (!) 137.4 kg    Examination:  General exam: NAD Respiratory system: Lungs clear.  Normal work of breathing.  Room  air Cardiovascular system: S1-2, RRR, no murmurs, no pedal edema Gastrointestinal system: Obese, soft, NT/ND, normal bowel sounds Central nervous system: Alert and oriented. No focal neurological deficits. Extremities: Symmetric 5 x 5 power. Skin: No rashes, lesions or ulcers Psychiatry: Judgement and insight appear normal. Mood & affect  appropriate.     Data Reviewed: I have personally reviewed following labs and imaging studies  CBC: Recent Labs  Lab 05/23/23 1329 05/24/23 0533  WBC 15.1* 15.2*  NEUTROABS 11.0*  --   HGB 11.6* 11.0*  HCT 35.4* 33.0*  MCV 93.2 91.4  PLT 285 290   Basic Metabolic Panel: Recent Labs  Lab 05/23/23 1329 05/24/23 0533 05/25/23 0631  NA 130* 131* 131*  K 3.7 3.9 3.6  CL 88* 92* 95*  CO2 28 29 24   GLUCOSE 178* 178* 164*  BUN 94* 103* 110*  CREATININE 3.32* 3.23* 3.37*  CALCIUM 14.2* 13.5* 11.8*   GFR: Estimated Creatinine Clearance: 28.1 mL/min (A) (by C-G formula based on SCr of 3.37 mg/dL (H)). Liver Function Tests: Recent Labs  Lab 05/23/23 1329 05/25/23 0631  AST 192* 112*  ALT 89* 76*  ALKPHOS 120 90  BILITOT 0.6 0.4  PROT 8.0 7.3  ALBUMIN 3.8 3.3*   No results for input(s): "LIPASE", "AMYLASE" in the last 168 hours. No results for input(s): "AMMONIA" in the last 168 hours. Coagulation Profile: No results for input(s): "INR", "PROTIME" in the last 168 hours. Cardiac Enzymes: Recent Labs  Lab 05/23/23 1329 05/24/23 0533  CKTOTAL 1,533* 801*   BNP (last 3 results) No results for input(s): "PROBNP" in the last 8760 hours. HbA1C: No results for input(s): "HGBA1C" in the last 72 hours. CBG: Recent Labs  Lab 05/24/23 1218 05/24/23 1631 05/24/23 2105 05/25/23 0744 05/25/23 1145  GLUCAP 138* 131* 176* 145* 190*   Lipid Profile: No results for input(s): "CHOL", "HDL", "LDLCALC", "TRIG", "CHOLHDL", "LDLDIRECT" in the last 72 hours. Thyroid Function Tests: No results for input(s): "TSH", "T4TOTAL", "FREET4", "T3FREE", "THYROIDAB" in the last 72 hours. Anemia Panel: No results for input(s): "VITAMINB12", "FOLATE", "FERRITIN", "TIBC", "IRON", "RETICCTPCT" in the last 72 hours. Sepsis Labs: No results for input(s): "PROCALCITON", "LATICACIDVEN" in the last 168 hours.  No results found for this or any previous visit (from the past 240 hour(s)).        Radiology Studies: No results found.      Scheduled Meds:  allopurinol  100 mg Oral Daily   amiodarone  200 mg Oral Daily   aspirin EC  81 mg Oral Daily   Chlorhexidine Gluconate Cloth  6 each Topical Q0600   doxazosin  4 mg Oral QHS   famotidine  20 mg Oral BID   heparin  5,000 Units Subcutaneous Q8H   hydrocerin   Topical Daily   insulin aspart  0-5 Units Subcutaneous QHS   insulin aspart  0-9 Units Subcutaneous TID WC   isosorbide mononitrate  30 mg Oral Daily   levothyroxine  112 mcg Oral Q0600   loratadine  10 mg Oral Daily   mometasone-formoterol  2 puff Inhalation BID   multivitamin with minerals  1 tablet Oral Daily   pantoprazole  40 mg Oral BID   pneumococcal 20-valent conjugate vaccine  0.5 mL Intramuscular Tomorrow-1000   polyethylene glycol  17 g Oral Daily   Ensure Max Protein  11 oz Oral Daily   spironolactone  25 mg Oral Daily   sucralfate  1 g Oral TID WC & HS   torsemide  80 mg  Oral Daily   triamcinolone cream   Topical Q0600   Continuous Infusions:  sodium chloride 75 mL/hr at 05/25/23 1209   cefTRIAXone (ROCEPHIN)  IV 2 g (05/24/23 2100)   doxycycline (VIBRAMYCIN) IV 100 mg (05/25/23 0845)     LOS: 2 days      Tresa Moore, MD [facility]   If 7PM-7AM, please contact night-coverage  05/25/2023, 1:20 PM

## 2023-05-25 NOTE — Progress Notes (Addendum)
[facility]  ROUNDING NOTE   Subjective:   Jason Hudson  is a 72 y.o. male with past medical history of chronic systolic heart failure, atrial fibrillation on anticoagulation and chronic kidney disease stage IV.  Patient presents to the emergency department with abnormal labs.  Patient has been admitted for Hypercalcemia [E83.52] AKI (acute kidney injury) (HCC) [N17.9] Non-traumatic rhabdomyolysis [M62.82]  Patient is known to our practice and is followed by Dr Wynelle Link. He was last seen in office on May 03, 2023.   Update:  Patient seen sitting up in bed Alert, ill appearing States he didn't sleep well, abd pain Reports constipation.  Calcium  11.8   Objective:  Vital signs in last 24 hours:  Temp:  [97.8 F (36.6 C)-98.2 F (36.8 C)] 97.9 F (36.6 C) (10/03 0742) Pulse Rate:  [72-75] 74 (10/03 0742) Resp:  [16-18] 16 (10/03 0742) BP: (130-143)/(68-70) 143/69 (10/03 0742) SpO2:  [94 %-100 %] 100 % (10/03 0742)  Weight change:  Filed Weights   05/23/23 1640  Weight: (!) 137.4 kg    Intake/Output: I/O last 3 completed shifts: In: 1402.2 [P.O.:720; I.V.:582; IV Piggyback:100.2] Out: 1300 [Urine:1300]   Intake/Output this shift:  Total I/O In: 240 [P.O.:240] Out: -   Physical Exam: General: NAD, sitting in chair  Head: Normocephalic, atraumatic. Moist oral mucosal membranes  Eyes: Anicteric  Lungs:  Clear to auscultation  Heart: Regular rate and rhythm  Abdomen:  Soft, nontender,   Extremities: Trace peripheral edema.  Neurologic: Alert and oriented, moving all four extremities  Skin: No lesions  Access: None    Basic Metabolic Panel: Recent Labs  Lab 05/23/23 1329 05/24/23 0533 05/25/23 0631  NA 130* 131* 131*  K 3.7 3.9 3.6  CL 88* 92* 95*  CO2 28 29 24   GLUCOSE 178* 178* 164*  BUN 94* 103* 110*  CREATININE 3.32* 3.23* 3.37*  CALCIUM 14.2* 13.5* 11.8*    Liver Function Tests: Recent Labs  Lab 05/23/23 1329  05/25/23 0631  AST 192* 112*  ALT 89* 76*  ALKPHOS 120 90  BILITOT 0.6 0.4  PROT 8.0 7.3  ALBUMIN 3.8 3.3*   No results for input(s): "LIPASE", "AMYLASE" in the last 168 hours. No results for input(s): "AMMONIA" in the last 168 hours.  CBC: Recent Labs  Lab 05/23/23 1329 05/24/23 0533  WBC 15.1* 15.2*  NEUTROABS 11.0*  --   HGB 11.6* 11.0*  HCT 35.4* 33.0*  MCV 93.2 91.4  PLT 285 290    Cardiac Enzymes: Recent Labs  Lab 05/23/23 1329 05/24/23 0533  CKTOTAL 1,533* 801*    BNP: Invalid input(s): "POCBNP"  CBG: Recent Labs  Lab 05/24/23 1218 05/24/23 1631 05/24/23 2105 05/25/23 0744 05/25/23 1145  GLUCAP 138* 131* 176* 145* 190*    Microbiology: Results for orders placed or performed during the hospital encounter of 04/29/23  SARS Coronavirus 2 by RT PCR (hospital order, performed in [facility] hospital lab) *cepheid single result test* Anterior Nasal Swab     Status: None   Collection Time: 04/29/23  5:02 AM   Specimen: Anterior Nasal Swab  Result Value Ref Range Status   SARS Coronavirus 2 by RT PCR NEGATIVE NEGATIVE Final    Comment: (NOTE) SARS-CoV-2 target nucleic acids are NOT DETECTED.  The SARS-CoV-2 RNA is generally detectable in upper and lower respiratory specimens during the acute phase of infection. The lowest concentration of SARS-CoV-2 viral copies this assay can detect is 250 copies / mL. A negative result does  not preclude SARS-CoV-2 infection and should not be used as the sole basis for treatment or other patient management decisions.  A negative result may occur with improper specimen collection / handling, submission of specimen other than nasopharyngeal swab, presence of viral mutation(s) within the areas targeted by this assay, and inadequate number of viral copies (<250 copies / mL). A negative result must be combined with clinical observations, patient history, and epidemiological information.  Fact Sheet for Patients:    RoadLapTop.co.za  Fact Sheet for Healthcare Providers: http://kim-miller.com/  This test is not yet approved or  cleared by the [place] FDA and has been authorized for detection and/or diagnosis of SARS-CoV-2 by FDA under an Emergency Use Authorization (EUA).  This EUA will remain in effect (meaning this test can be used) for the duration of the COVID-19 declaration under Section 564(b)(1) of the Act, 21 U.S.C. section 360bbb-3(b)(1), unless the authorization is terminated or revoked sooner.  Performed at [facility], [address] [place], [place] [address]     Coagulation Studies: No results for input(s): "LABPROT", "INR" in the last 72 hours.  Urinalysis: No results for input(s): "COLORURINE", "LABSPEC", "PHURINE", "GLUCOSEU", "HGBUR", "BILIRUBINUR", "KETONESUR", "PROTEINUR", "UROBILINOGEN", "NITRITE", "LEUKOCYTESUR" in the last 72 hours.  Invalid input(s): "APPERANCEUR"    Imaging: No results found.   Medications:    sodium chloride 75 mL/hr at 05/25/23 1209   cefTRIAXone (ROCEPHIN)  IV 2 g (05/24/23 2100)   doxycycline (VIBRAMYCIN) IV 100 mg (05/25/23 0845)    allopurinol  100 mg Oral Daily   amiodarone  200 mg Oral Daily   aspirin EC  81 mg Oral Daily   Chlorhexidine Gluconate Cloth  6 each Topical Q0600   doxazosin  4 mg Oral QHS   famotidine  20 mg Oral BID   heparin  5,000 Units Subcutaneous Q8H   hydrocerin   Topical Daily   insulin aspart  0-5 Units Subcutaneous QHS   insulin aspart  0-9 Units Subcutaneous TID WC   isosorbide mononitrate  30 mg Oral Daily   levothyroxine  112 mcg Oral Q0600   loratadine  10 mg Oral Daily   mometasone-formoterol  2 puff Inhalation BID   multivitamin with minerals  1 tablet Oral Daily   pantoprazole  40 mg Oral BID   pneumococcal 20-valent conjugate vaccine  0.5 mL Intramuscular Tomorrow-1000   polyethylene glycol  17 g Oral Daily   Ensure Max Protein  11  oz Oral Daily   spironolactone  25 mg Oral Daily   sucralfate  1 g Oral TID WC & HS   torsemide  80 mg Oral Daily   triamcinolone cream   Topical Q0600   acetaminophen **OR** acetaminophen, alum & mag hydroxide-simeth, diphenhydrAMINE, ipratropium, nitroGLYCERIN, ondansetron **OR** ondansetron (ZOFRAN) IV, senna-docusate, sodium phosphate  Assessment/ Plan:  Mr. Jason Hudson is a 72 y.o.  male with past medical history of chronic systolic heart failure, atrial fibrillation on anticoagulation and chronic kidney disease stage IV.  Patient presents to the emergency department with abnormal labs  Patient has been admitted for Hypercalcemia [E83.52] AKI (acute kidney injury) (HCC) [N17.9] Non-traumatic rhabdomyolysis [M62.82]   Acute Kidney Injury with hyponatremia on chronic kidney disease stage IV with baseline creatinine 2.50 and GFR of 27 on 03/20/23.  Acute kidney injury appears secondary to hypercalcemia and poor oral intake. No recent IV contrast exposure.  Sodium 130.  Currently receiving maintenance IV fluids, normal saline at 100 mL/h.  Good urine output noted.  Continue  IV fluids for now.  Avoid nephrotoxic agents and therapies.  No acute indication for dialysis.   Lab Results  Component Value Date   CREATININE 3.37 (H) 05/25/2023   CREATININE 3.23 (H) 05/24/2023   CREATININE 3.32 (H) 05/23/2023    Intake/Output Summary (Last 24 hours) at 05/25/2023 1322 Last data filed at 05/25/2023 0900 Gross per 24 hour  Intake 1302.04 ml  Output --  Net 1302.04 ml   2. Secondary Hyperparathyroidism: with outpatient labs: PTH 74, phosphorus 2.7, calcium 9.1 on 77/29/24.   Lab Results  Component Value Date   CALCIUM 11.8 (H) 05/25/2023   PHOS 4.6 04/21/2023    Hypercalcemia noted on admission, 14.2.  Secondary to increase antacid consumption.  All calcium supplements have been held.  Status post calcitonin x2. Will receive third dose today.   3. Anemia of chronic kidney  disease Lab Results  Component Value Date   HGB 11.0 (L) 05/24/2023    Hgb at goal. No need for ESA's at this time.  4. Diabetes mellitus type II with chronic kidney disease/renal manifestations: noninsulin dependent. Most recent hemoglobin A1c is 11.3 on 04/13/23.     LOS: 2 Jason Hudson 10/3/20241:22 PM

## 2023-05-25 NOTE — Progress Notes (Signed)
Occupational Therapy Treatment Patient Details Name: Jason Hudson MRN: [id number] DOB: 07/04/1951 Today's Date: 05/25/2023   History of present illness Pt is a 72 year old male presenting to the ED from ID clinic, admitted with hypercalcemia;  PMH significant for Leukocytosis, Essential hypertension, Paroxysmal atrial fibrillation (HCC), Obesity, Class III, BMI 40-49.9 (morbid obesity) (HCC), Obstructive sleep apnea of adult, Acute exacerbation of CHF (congestive heart failure) (HCC),AKI (acute kidney injury) (HCC),Hypercholesterolemia, Hyperlipidemia due to type 2 diabetes mellitus (HCC), Benign hypertensive kidney disease with chronic kidney disease,Type 2 diabetes mellitus (HCC),Elevated LFTs, Bacteremia   OT comments  Chart reviewed, pt greeted in bathroom stating he is trying to have a BM, feeling fatigued from multiple trips to the bathroom on this date. Tx session targeted improving functional activity tolerance in the setting of ADL tasks. Supervision-CGA required for functional amb with RW approx 15' in room, CGA for toileting after small BM. Supervision required for grooming tasks at sink level. Pt is left with PT for hand off. OT will follow acutely.       If plan is discharge home, recommend the following:  A little help with walking and/or transfers;A lot of help with bathing/dressing/bathroom;Assistance with cooking/housework;Assist for transportation;Help with stairs or ramp for entrance   Equipment Recommendations  Tub/shower bench;Other (comment) (2WW)    Recommendations for Other Services      Precautions / Restrictions Precautions Precautions: Fall Restrictions Weight Bearing Restrictions: No       Mobility Bed Mobility               General bed mobility comments: NT on this date    Transfers Overall transfer level: Needs assistance Equipment used: Rolling walker (2 wheels) Transfers: Sit to/from Stand Sit to Stand: Supervision, Contact guard  assist                 Balance Overall balance assessment: Needs assistance Sitting-balance support: Feet supported Sitting balance-Leahy Scale: Good     Standing balance support: Bilateral upper extremity supported, During functional activity, Reliant on assistive device for balance Standing balance-Leahy Scale: Fair                             ADL either performed or assessed with clinical judgement   ADL Overall ADL's : Needs assistance/impaired Eating/Feeding: Set up;Sitting   Grooming: Supervision/safety;Standing Grooming Details (indicate cue type and reason): at sink with RW             Lower Body Dressing: Maximal assistance   Toilet Transfer: Contact guard assist;Rolling walker (2 wheels)   Toileting- Clothing Manipulation and Hygiene: Contact guard assist;Sitting/lateral lean Toileting - Clothing Manipulation Details (indicate cue type and reason): after small BM on toilet     Functional mobility during ADLs: Supervision/safety;Contact guard assist;Rolling walker (2 wheels) (approx 15' in room with RW)      Extremity/Trunk Assessment              Vision       Perception     Praxis      Cognition Arousal: Alert Behavior During Therapy: WFL for tasks assessed/performed Overall Cognitive Status: Within Functional Limits for tasks assessed                                          Exercises Other Exercises Other Exercises: edu re: positioning for  improved BM    Shoulder Instructions       General Comments edema noted throughout BLE    Pertinent Vitals/ Pain       Pain Assessment Pain Assessment: 0-10 Pain Score: 5  Pain Location: buttocks Pain Descriptors / Indicators: Aching, Discomfort, Grimacing Pain Intervention(s): Limited activity within patient's tolerance, Repositioned, Monitored during session  Home Living                                          Prior  Functioning/Environment              Frequency  Min 1X/week        Progress Toward Goals  OT Goals(current goals can now be found in the care plan section)  Progress towards OT goals: Progressing toward goals     Plan      Co-evaluation                 AM-PAC OT "6 Clicks" Daily Activity     Outcome Measure   Help from another person eating meals?: None Help from another person taking care of personal grooming?: None Help from another person toileting, which includes using toliet, bedpan, or urinal?: A Little Help from another person bathing (including washing, rinsing, drying)?: A Lot Help from another person to put on and taking off regular upper body clothing?: None Help from another person to put on and taking off regular lower body clothing?: A Lot 6 Click Score: 19    End of Session Equipment Utilized During Treatment: Rolling walker (2 wheels)  OT Visit Diagnosis: Other abnormalities of gait and mobility (R26.89)   Activity Tolerance Patient tolerated treatment well   Patient Left in chair;with call bell/phone within reach;Other (comment) (in care of PT)   Nurse Communication Mobility status        Time: 8119-1478 OT Time Calculation (min): 13 min  Charges: OT General Charges $OT Visit: 1 Visit OT Treatments $Self Care/Home Management : 8-22 mins  Oleta Mouse, OTD OTR/L  05/25/23, 3:44 PM

## 2023-05-25 NOTE — Progress Notes (Signed)
Lab notified x2 for pending lab draw. Will be up to unit shortly per staff. Will report off to oncoming nurse for followup.

## 2023-05-25 NOTE — Progress Notes (Signed)
   05/25/23 0500  Provider Notification  Provider Name/Title Dr. Arville Care  Date Provider Notified 05/25/23  Time Provider Notified [phone]  Method of Notification  (secure chat)  Notification Reason Other (Comment) (Central line care orders and orders for lab draws via line)  Provider response Other (Comment) (Peripheral lab draw preferable per Dr. Arville Care unless unable to obtain, awaiting line care orders)  Date of Provider Response 05/25/23  Time of Provider Response [phone]

## 2023-05-25 NOTE — Progress Notes (Signed)
Patient refused dressing change until he has a BM.

## 2023-05-25 NOTE — TOC Progression Note (Signed)
Transition of Care [facility]) - Progression Note    Patient Details  Name: Jason Hudson MRN: [id number] Date of Birth: April 21, 1951  Transition of [facility]) CM/SW Contact  Chapman Fitch, RN Phone Number: 05/25/2023, 11:37 AM  Clinical Narrative:     Plan for patient to transition to oral antibiotics at discharge.   Pam with Julianne Rice updated    Expected Discharge Plan: Home w Home Health Services    Expected Discharge Plan and Services                                   HH Arranged: PT, OT, RN [facility]: [facility] Date [facility] Agency Contacted: 05/24/23   Representative spoke with at [facility] Agency: Barbara Cower   Social Determinants of Health (SDOH) Interventions SDOH Screenings   Food Insecurity: No Food Insecurity (05/23/2023)  Housing: Low Risk  (05/23/2023)  Transportation Needs: No Transportation Needs (05/23/2023)  Utilities: Not At Risk (05/23/2023)  Alcohol Screen: Low Risk  (05/11/2022)  Depression (PHQ2-9): Low Risk  (05/23/2023)  Financial Resource Strain: Low Risk  (05/17/2023)   Received from [facility]  Tobacco Use: Low Risk  (05/23/2023)   Received from [facility]    Readmission Risk Interventions    04/14/2023   10:31 AM  Readmission Risk Prevention Plan  Transportation Screening Complete  PCP or Specialist Appt within 3-5 Days Complete  HRI or Home Care Consult Complete  Social Work Consult for Recovery Care Planning/Counseling Complete  Palliative Care Screening Not Complete  Medication Review [employer]) Complete

## 2023-05-25 NOTE — Progress Notes (Signed)
Physical Therapy Treatment Patient Details Name: Senaca Berardo MRN: [id number] DOB: July 02, 1951 Today's Date: 05/25/2023   History of Present Illness Pt is a 72 year old male presenting to the ED from ID clinic, admitted with hypercalcemia;  PMH significant for Leukocytosis, Essential hypertension, Paroxysmal atrial fibrillation (HCC), Obesity, Class III, BMI 40-49.9 (morbid obesity) (HCC), Obstructive sleep apnea of adult, Acute exacerbation of CHF (congestive heart failure) (HCC),AKI (acute kidney injury) (HCC),Hypercholesterolemia, Hyperlipidemia due to type 2 diabetes mellitus (HCC), Benign hypertensive kidney disease with chronic kidney disease,Type 2 diabetes mellitus (HCC),Elevated LFTs, Bacteremia    PT Comments  Pt received through hand off after completing OT session. Limited functional activity this date due to pt feeling "worn out" from multiple trips to bathroom to try and have a BM, however unsuccessful. Pt is currently using a RW with Supervision with good demonstration of balance and safety awareness. PTA pt was not reliant on AD. SpO2 92% upon ambulating back from the bathroom on RA, no SOB. Reviewed and completed LE exercises which appear to have significant edema, no weeping noted. Will continue to address acute PT goals. D/C recs remain appropriate.   If plan is discharge home, recommend the following: A little help with walking and/or transfers;A little help with bathing/dressing/bathroom;Assist for transportation;Help with stairs or ramp for entrance   Can travel by private vehicle        Equipment Recommendations  Rolling walker (2 wheels);Rollator (4 wheels)    Recommendations for Other Services       Precautions / Restrictions Precautions Precautions: Fall Restrictions Weight Bearing Restrictions: No     Mobility  Bed Mobility               General bed mobility comments: NT in recliner pre/post session; sleeps in recliner at baseline     Transfers Overall transfer level: Needs assistance Equipment used: Rolling walker (2 wheels) Transfers: Sit to/from Stand Sit to Stand: Supervision           General transfer comment: able to perform STS from recliner supervision for safety without AD x 4    Ambulation/Gait Ambulation/Gait assistance: Contact guard assist, Supervision Gait Distance (Feet): 15 Feet Assistive device: Rolling walker (2 wheels) Gait Pattern/deviations: Step-through pattern, Trunk flexed, Wide base of support, Decreased stride length Gait velocity: slightly decreased     General Gait Details: Limited gait tolerance due to fatigue from multiple trips to BR today for BM, however unsuccessful, nursing notified   Stairs             Wheelchair Mobility     Tilt Bed    Modified Rankin (Stroke Patients Only)       Balance Overall balance assessment: Needs assistance Sitting-balance support: Feet supported Sitting balance-Leahy Scale: Good Sitting balance - Comments: able to maintain seated edge of seat balance during functional activities   Standing balance support: Bilateral upper extremity supported, During functional activity, Reliant on assistive device for balance Standing balance-Leahy Scale: Fair Standing balance comment: able to maintain static standing balance during functional activities without AD                            Cognition Arousal: Alert Behavior During Therapy: WFL for tasks assessed/performed Overall Cognitive Status: Within Functional Limits for tasks assessed  General Comments: AO x4; pleasant and cooperative during session        Exercises General Exercises - Lower Extremity Ankle Circles/Pumps: AROM, Both, 15 reps, Seated Long Arc Quad: AROM, Both, 10 reps, Seated    General Comments General comments (skin integrity, edema, etc.):  (B Lower legs dressing wraps intact, significant edema  noted, no weeping noted. Pt educated on benefits of elevation and exercises for fluid return)      Pertinent Vitals/Pain Pain Assessment Pain Assessment: 0-10 Pain Score: 5  Pain Location: buttocks, B LE's Pain Descriptors / Indicators: Aching, Discomfort, Grimacing Pain Intervention(s): Limited activity within patient's tolerance, Repositioned    Home Living                          Prior Function            PT Goals (current goals can now be found in the care plan section) Acute Rehab PT Goals Patient Stated Goal: to go home Progress towards PT goals: Progressing toward goals    Frequency    Min 1X/week      PT Plan      Co-evaluation              AM-PAC PT "6 Clicks" Mobility   Outcome Measure  Help needed turning from your back to your side while in a flat bed without using bedrails?: A Little Help needed moving from lying on your back to sitting on the side of a flat bed without using bedrails?: A Little Help needed moving to and from a bed to a chair (including a wheelchair)?: A Little Help needed standing up from a chair using your arms (e.g., wheelchair or bedside chair)?: A Little Help needed to walk in hospital room?: A Little Help needed climbing 3-5 steps with a railing? : A Lot 6 Click Score: 17    End of Session   Activity Tolerance: Patient tolerated treatment well Patient left: in chair;with call bell/phone within reach Nurse Communication: Mobility status;Other (comment) (Removal of B lower leg dressings (cleared by nursing)) PT Visit Diagnosis: Unsteadiness on feet (R26.81);Muscle weakness (generalized) (M62.81)     Time: 0865-7846 PT Time Calculation (min) (ACUTE ONLY): 24 min  Charges:    $Gait Training: 8-22 mins $Therapeutic Exercise: 8-22 mins PT General Charges $$ ACUTE PT VISIT: 1 Visit                    Zadie Cleverly, PTA  Jannet Askew 05/25/2023, 3:26 PM

## 2023-05-26 ENCOUNTER — Encounter
Admission: EM | Disposition: A | Discharge status: Home Health | Payer: Self-pay | Source: Home / Self Care | Attending: Internal Medicine

## 2023-05-26 DIAGNOSIS — Z452 Encounter for adjustment and management of vascular access device: Secondary | ICD-10-CM

## 2023-05-26 DIAGNOSIS — M4640 Discitis, unspecified, site unspecified: Secondary | ICD-10-CM

## 2023-05-26 HISTORY — PX: DIALYSIS/PERMA CATHETER REMOVAL: CATH118289

## 2023-05-26 LAB — CK: Total CK: 185 U/L (ref 49–397)

## 2023-05-26 LAB — BASIC METABOLIC PANEL
Anion gap: 12 (ref 5–15)
BUN: 113 mg/dL — ABNORMAL HIGH (ref 8–23)
CO2: 25 mmol/L (ref 22–32)
Calcium: 11.4 mg/dL — ABNORMAL HIGH (ref 8.9–10.3)
Chloride: 96 mmol/L — ABNORMAL LOW (ref 98–111)
Creatinine, Ser: 3.45 mg/dL — ABNORMAL HIGH (ref 0.61–1.24)
GFR, Estimated: 18 mL/min — ABNORMAL LOW (ref >60)
Glucose, Bld: 150 mg/dL — ABNORMAL HIGH (ref 70–99)
Potassium: 3.8 mmol/L (ref 3.5–5.1)
Sodium: 133 mmol/L — ABNORMAL LOW (ref 135–145)

## 2023-05-26 LAB — GLUCOSE, CAPILLARY
Glucose-Capillary: 195 mg/dL — ABNORMAL HIGH (ref 70–99)
Glucose-Capillary: 209 mg/dL — ABNORMAL HIGH (ref 70–99)
Glucose-Capillary: 159 mg/dL — ABNORMAL HIGH (ref 70–99)
Glucose-Capillary: 245 mg/dL — ABNORMAL HIGH (ref 70–99)
Glucose-Capillary: 192 mg/dL — ABNORMAL HIGH (ref 70–99)
Glucose-Capillary: 141 mg/dL — ABNORMAL HIGH (ref 70–99)

## 2023-05-26 SURGERY — DIALYSIS/PERMA CATHETER REMOVAL
Anesthesia: LOCAL

## 2023-05-26 MED ORDER — DOXYCYCLINE HYCLATE 100 MG PO TABS: 100.0000 mg | ORAL_TABLET | Freq: Two times a day (BID) | ORAL | Status: DC

## 2023-05-26 MED ORDER — DOXYCYCLINE HYCLATE 100 MG PO TABS: 100.0000 mg | ORAL_TABLET | Freq: Two times a day (BID) | ORAL | 0 refills | Status: AC

## 2023-05-26 MED ORDER — PANTOPRAZOLE SODIUM 40 MG PO TBEC: 40.0000 mg | DELAYED_RELEASE_TABLET | Freq: Every day | ORAL | 0 refills | Status: DC

## 2023-05-26 MED ORDER — FAMOTIDINE 20 MG PO TABS: 20.0000 mg | ORAL_TABLET | Freq: Two times a day (BID) | ORAL | 0 refills | Status: DC

## 2023-05-26 MED ORDER — CEFADROXIL 500 MG PO CAPS: 500.0000 mg | ORAL_CAPSULE | Freq: Two times a day (BID) | ORAL | 0 refills | Status: AC

## 2023-05-26 SURGICAL SUPPLY — 3 items
FORCEPS HALSTEAD CVD 5IN STRL (INSTRUMENTS) IMPLANT
SCALPEL PROTECTED #11 DISP (BLADE) IMPLANT
TRAY LACERAT/PLASTIC (MISCELLANEOUS) IMPLANT

## 2023-05-26 NOTE — Op Note (Signed)
Operative Note     Preoperative diagnosis:   1. Discitis, completed antibiotics  Postoperative diagnosis:  1. Discitis, completed antibiotics  Procedure:  Removal of Right jugular Hickman  Surgeon:  Festus Barren, MD  Anesthesia:  Local  EBL:  Minimal  Indication for the Procedure:  The patient has completed therapy for his discitis and no longer needs their Hickman catheter. This can be removed.  Risks and benefits are discussed and informed consent is obtained.  Description of the Procedure:  The patient's right neck, chest and existing catheter were sterilely prepped and draped. The area around the catheter was anesthetized copiously with 1% lidocaine. The catheter was dissected out with curved hemostats until the cuff was freed from the surrounding fibrous sheath. The fiber sheath was transected, and the catheter was then removed in its entirety using gentle traction. Pressure was held and sterile dressings were placed. The patient tolerated the procedure well and was taken to the recovery room in stable condition.     Festus Barren  05/26/2023, 4:56 PM This note was created with Dragon Medical transcription system. Any errors in dictation are purely unintentional.

## 2023-05-26 NOTE — Progress Notes (Signed)
[facility]  ROUNDING NOTE   Subjective:   Jason Hudson  is a 72 y.o. male with past medical history of chronic systolic heart failure, atrial fibrillation on anticoagulation and chronic kidney disease stage IV.  Patient presents to the emergency department with abnormal labs.  Patient has been admitted for Hypercalcemia [E83.52] AKI (acute kidney injury) (HCC) [N17.9] Non-traumatic rhabdomyolysis [M62.82]  Patient is known to our practice and is followed by Dr Wynelle Link. He was last seen in office on May 03, 2023.   Update:  Patient seen sitting at side of bed Alert and oriented Tolerated meals  Calcium  11.4   Objective:  Vital signs in last 24 hours:  Temp:  [97.5 F (36.4 C)-98.4 F (36.9 C)] 97.7 F (36.5 C) (10/04 0745) Pulse Rate:  [75-78] 75 (10/04 0745) Resp:  [16-18] 18 (10/04 0745) BP: (116-135)/(56-73) 121/56 (10/04 1010) SpO2:  [93 %-100 %] 95 % (10/04 0745)  Weight change:  Filed Weights   05/23/23 1640  Weight: (!) 137.4 kg    Intake/Output: I/O last 3 completed shifts: In: 2860.6 [P.O.:480; I.V.:1226.4; IV Piggyback:1154.2] Out: -    Intake/Output this shift:  Total I/O In: 642.8 [P.O.:597; IV Piggyback:45.8] Out: -   Physical Exam: General: NAD, sitting at bedside   Head: Normocephalic, atraumatic. Moist oral mucosal membranes  Eyes: Anicteric  Lungs:  Clear to auscultation  Heart: Regular rate and rhythm  Abdomen:  Soft, nontender,   Extremities: Trace peripheral edema.  Neurologic: Alert and oriented, moving all four extremities  Skin: No lesions  Access: None    Basic Metabolic Panel: Recent Labs  Lab 05/23/23 1329 05/24/23 0533 05/25/23 0631 05/26/23 0841  NA 130* 131* 131* 133*  K 3.7 3.9 3.6 3.8  CL 88* 92* 95* 96*  CO2 28 29 24 25   GLUCOSE 178* 178* 164* 150*  BUN 94* 103* 110* 113*  CREATININE 3.32* 3.23* 3.37* 3.45*  CALCIUM 14.2* 13.5* 11.8* 11.4*    Liver Function Tests: Recent Labs   Lab 05/23/23 1329 05/25/23 0631  AST 192* 112*  ALT 89* 76*  ALKPHOS 120 90  BILITOT 0.6 0.4  PROT 8.0 7.3  ALBUMIN 3.8 3.3*   No results for input(s): "LIPASE", "AMYLASE" in the last 168 hours. No results for input(s): "AMMONIA" in the last 168 hours.  CBC: Recent Labs  Lab 05/23/23 1329 05/24/23 0533  WBC 15.1* 15.2*  NEUTROABS 11.0*  --   HGB 11.6* 11.0*  HCT 35.4* 33.0*  MCV 93.2 91.4  PLT 285 290    Cardiac Enzymes: Recent Labs  Lab 05/23/23 1329 05/24/23 0533  CKTOTAL 1,533* 801*    BNP: Invalid input(s): "POCBNP"  CBG: Recent Labs  Lab 05/25/23 1145 05/25/23 1721 05/25/23 2116 05/26/23 0744 05/26/23 1151  GLUCAP 190* 195* 209* 159* 245*    Microbiology: Results for orders placed or performed during the hospital encounter of 04/29/23  SARS Coronavirus 2 by RT PCR (hospital order, performed in [facility] hospital lab) *cepheid single result test* Anterior Nasal Swab     Status: None   Collection Time: 04/29/23  5:02 AM   Specimen: Anterior Nasal Swab  Result Value Ref Range Status   SARS Coronavirus 2 by RT PCR NEGATIVE NEGATIVE Final    Comment: (NOTE) SARS-CoV-2 target nucleic acids are NOT DETECTED.  The SARS-CoV-2 RNA is generally detectable in upper and lower respiratory specimens during the acute phase of infection. The lowest concentration of SARS-CoV-2 viral copies this assay can detect is 250  copies / mL. A negative result does not preclude SARS-CoV-2 infection and should not be used as the sole basis for treatment or other patient management decisions.  A negative result may occur with improper specimen collection / handling, submission of specimen other than nasopharyngeal swab, presence of viral mutation(s) within the areas targeted by this assay, and inadequate number of viral copies (<250 copies / mL). A negative result must be combined with clinical observations, patient history, and epidemiological information.  Fact Sheet  for Patients:   RoadLapTop.co.za  Fact Sheet for Healthcare Providers: http://kim-miller.com/  This test is not yet approved or  cleared by the [place] FDA and has been authorized for detection and/or diagnosis of SARS-CoV-2 by FDA under an Emergency Use Authorization (EUA).  This EUA will remain in effect (meaning this test can be used) for the duration of the COVID-19 declaration under Section 564(b)(1) of the Act, 21 U.S.C. section 360bbb-3(b)(1), unless the authorization is terminated or revoked sooner.  Performed at [facility], [address] [place], [place] [address]     Coagulation Studies: No results for input(s): "LABPROT", "INR" in the last 72 hours.  Urinalysis: No results for input(s): "COLORURINE", "LABSPEC", "PHURINE", "GLUCOSEU", "HGBUR", "BILIRUBINUR", "KETONESUR", "PROTEINUR", "UROBILINOGEN", "NITRITE", "LEUKOCYTESUR" in the last 72 hours.  Invalid input(s): "APPERANCEUR"    Imaging: No results found.   Medications:    sodium chloride 75 mL/hr at 05/26/23 0507   cefTRIAXone (ROCEPHIN)  IV Stopped (05/25/23 2203)    allopurinol  100 mg Oral Daily   amiodarone  200 mg Oral Daily   aspirin EC  81 mg Oral Daily   Chlorhexidine Gluconate Cloth  6 each Topical Q0600   doxazosin  4 mg Oral QHS   doxycycline  100 mg Oral Q12H   famotidine  20 mg Oral BID   heparin  5,000 Units Subcutaneous Q8H   hydrocerin   Topical Daily   insulin aspart  0-5 Units Subcutaneous QHS   insulin aspart  0-9 Units Subcutaneous TID WC   isosorbide mononitrate  30 mg Oral Daily   levothyroxine  112 mcg Oral Q0600   loratadine  10 mg Oral Daily   mometasone-formoterol  2 puff Inhalation BID   multivitamin with minerals  1 tablet Oral Daily   pantoprazole  40 mg Oral BID   pneumococcal 20-valent conjugate vaccine  0.5 mL Intramuscular Tomorrow-1000   polyethylene glycol  17 g Oral Daily   Ensure Max Protein  11 oz  Oral Daily   spironolactone  25 mg Oral Daily   sucralfate  1 g Oral TID WC & HS   torsemide  80 mg Oral Daily   triamcinolone cream   Topical Q0600   acetaminophen **OR** acetaminophen, alum & mag hydroxide-simeth, diphenhydrAMINE, ipratropium, nitroGLYCERIN, ondansetron **OR** ondansetron (ZOFRAN) IV, senna-docusate, sodium phosphate  Assessment/ Plan:  Mr. Jason Hudson is a 72 y.o.  male with past medical history of chronic systolic heart failure, atrial fibrillation on anticoagulation and chronic kidney disease stage IV.  Patient presents to the emergency department with abnormal labs  Patient has been admitted for Hypercalcemia [E83.52] AKI (acute kidney injury) (HCC) [N17.9] Non-traumatic rhabdomyolysis [M62.82]   Acute Kidney Injury with hyponatremia on chronic kidney disease stage IV with baseline creatinine 2.50 and GFR of 27 on 03/20/23.  Acute kidney injury appears secondary to hypercalcemia and poor oral intake. No recent IV contrast exposure.  Sodium 133 today.  Creatinine remains stable. Will schedule follow up appt with our office  at discharge.    Lab Results  Component Value Date   CREATININE 3.45 (H) 05/26/2023   CREATININE 3.37 (H) 05/25/2023   CREATININE 3.23 (H) 05/24/2023    Intake/Output Summary (Last 24 hours) at 05/26/2023 1236 Last data filed at 05/26/2023 0934 Gross per 24 hour  Intake 3023.39 ml  Output --  Net 3023.39 ml   2. Secondary Hyperparathyroidism: with outpatient labs: PTH 74, phosphorus 2.7, calcium 9.1 on 77/29/24.   Lab Results  Component Value Date   CALCIUM 11.4 (H) 05/26/2023   PHOS 4.6 04/21/2023    Hypercalcemia noted on admission, 14.2.  Secondary to increase antacid consumption.  All calcium supplements have been held.    Calcitonin ordered twice during this admission. Calcium 11.4. instructed to avoid calcium carbonate.   3. Anemia of chronic kidney disease Lab Results  Component Value Date   HGB 11.0 (L) 05/24/2023     Hgb within desired range.  4. Diabetes mellitus type II with chronic kidney disease/renal manifestations: noninsulin dependent. Most recent hemoglobin A1c is 11.3 on 04/13/23.     LOS: 3 Jahn Franchini 10/4/202412:36 PM

## 2023-05-26 NOTE — Discharge Summary (Signed)
Physician Discharge Summary  Thorwald Graul UMP:[id number] DOB: 23-Aug-1950 DOA: 05/23/2023  PCP: Enid Baas, MD  Admit date: 05/23/2023 Discharge date: 05/26/2023  Admitted From: Home Disposition:  Home w home health  Recommendations for Outpatient Follow-up:  Follow up with PCP in 1-2 weeks Follow up with ID as directed  Home Health:Yes PT OT  Equipment/Devices: Oxygen 2 L via nasal cannula  Discharge Condition:Stable  CODE STATUS:FULL  Diet recommendation: Low sodium  Brief/Interim Summary:  72 year old male with history of staph lugdunensis bacteremia and thoracic discitis, COPD, paroxysmal atrial fibrillation, who presents to the emergency department from ID clinic for chief concerns of acute kidney injury in setting of daptomycin use.    He endorses constipation, worsening in the last few weeks. He reports some baseline constipation for 3 years. He endorses generalized muscle ache.    He endorses chills and denies fever. He endorses nausea, vomiting. He denies trauma and known sick contact. He denies changes to urinary habits. He denies dysuria, hematuria, blood in his stool.     Discharge Diagnoses:  Principal Problem:   Hypocalcemia Active Problems:   Leukocytosis   Essential hypertension   Paroxysmal atrial fibrillation (HCC)   Obesity, Class III, BMI 40-49.9 (morbid obesity) (HCC)   Obstructive sleep apnea of adult   Acute exacerbation of CHF (congestive heart failure) (HCC)   AKI (acute kidney injury) (HCC)   Hypercholesterolemia   Hyperlipidemia due to type 2 diabetes mellitus (HCC)   Benign hypertensive kidney disease with chronic kidney disease   Type 2 diabetes mellitus (HCC)   Elevated LFTs   Bacteremia Hypercalcemia Likely in the setting of daptomycin use.  Patient is also been taking large amounts of Tums for reflux symptoms.  2 doses of calcitonin with good improvement in serum calcium.  Also received fluids. Plan: Stable for discharge  home.  Discontinue Tums use.  Discontinue calcitriol and vitamin D.  Follow-up outpatient nephrology 1 to 2 weeks     Leukocytosis Persistent over the last month  Treat per above   Paroxysmal atrial fibrillation (HCC) Amiodarone 200 mg daily Coreg 6.25 twice daily Patient does not appear to be on anticoagulation for unclear reasons   Obesity, Class III, BMI 40-49.9 (morbid obesity) (HCC) This complicates overall care and prognosis.    Bacteremia Staph lugdunensis bacteremia with T5-T6 discitis and osteomyelitis.  Infectious disease consulted on arrival for antimicrobial recommendations Hickman catheter removed.  Antibiotics will be switched to p.o.  Patient will discharge on p.o. doxycycline and p.o. cefadroxil for 30 days.  Prescription sent to outpatient pharmacy.   Type 2 diabetes mellitus (HCC) Resume regimen   AKI (acute kidney injury) [facility]) Nephrology, Dr. Cherylann Ratel has been consulted Kidney function improving Outpatient follow-up   Discharge Instructions   Allergies as of 05/26/2023       Reactions   Atorvastatin Other (See Comments)   Other reaction(s): Not available Other reaction(s): Other (See Comments)   Red Dye #40 (allura Red) Other (See Comments)   "not as allergic to it as I used to be. I can take a little of it"        Medication List     STOP taking these medications    calcitRIOL 0.25 MCG capsule Commonly known as: ROCALTROL   carvedilol 6.25 MG tablet Commonly known as: COREG   cefTRIAXone IVPB Commonly known as: ROCEPHIN   daptomycin IVPB Commonly known as: CUBICIN   Vitamin D3 50 MCG (2000 UT) Tabs       TAKE these  medications    albuterol 108 (90 Base) MCG/ACT inhaler Commonly known as: VENTOLIN HFA Inhale 1-2 puffs into the lungs every 6 (six) hours as needed for wheezing or shortness of breath.   allopurinol 100 MG tablet Commonly known as: ZYLOPRIM TAKE ONE TABLET (100 MG) BY MOUTH ONCE DAILY   amiodarone 200 MG  tablet Commonly known as: PACERONE Take 1 tablet (200 mg total) by mouth daily.   aspirin EC 81 MG tablet Take 81 mg by mouth daily. Swallow whole.   budesonide-formoterol 160-4.5 MCG/ACT inhaler Commonly known as: SYMBICORT Inhale 2 puffs into the lungs 2 (two) times daily.   cefadroxil 500 MG capsule Commonly known as: DURICEF Take 1 capsule (500 mg total) by mouth 2 (two) times daily.   diphenhydrAMINE 25 mg capsule Commonly known as: BENADRYL Take 25 mg by mouth every 6 (six) hours as needed for itching or allergies.   doxazosin 4 MG tablet Commonly known as: CARDURA TAKE 1 TABLET BY MOUTH AT BEDTIME   doxycycline 100 MG tablet Commonly known as: VIBRA-TABS Take 1 tablet (100 mg total) by mouth every 12 (twelve) hours.   famotidine 20 MG tablet Commonly known as: PEPCID Take 1 tablet (20 mg total) by mouth 2 (two) times daily.   glipiZIDE 5 MG 24 hr tablet Commonly known as: GLUCOTROL XL Take 10 mg by mouth daily with breakfast.   ipratropium 0.02 % nebulizer solution Commonly known as: ATROVENT Take 2.5 mLs (0.5 mg total) by nebulization every 6 (six) hours as needed for wheezing or shortness of breath.   isosorbide mononitrate 30 MG 24 hr tablet Commonly known as: IMDUR Take 30 mg by mouth daily.   levothyroxine 112 MCG tablet Commonly known as: SYNTHROID Take 112 mcg by mouth daily before breakfast.   loratadine 10 MG tablet Commonly known as: CLARITIN Take 10 mg by mouth daily.   multivitamin Tabs tablet Take 1 tablet by mouth daily.   nitroGLYCERIN 0.4 MG SL tablet Commonly known as: NITROSTAT Place 1 tablet (0.4 mg total) under the tongue every 5 (five) minutes as needed for chest pain.   oxycodone 5 MG capsule Commonly known as: OXY-IR Take 5 mg by mouth every 6 (six) hours as needed for pain.   pantoprazole 40 MG tablet Commonly known as: PROTONIX Take 1 tablet (40 mg total) by mouth daily.   potassium chloride 10 MEQ tablet Commonly  known as: KLOR-CON TAKE TWO TABLETS EVERY OTHER DAY AND 1 TABLET ON THE DAYS IN BETWEEN What changed: See the new instructions.   spironolactone 25 MG tablet Commonly known as: ALDACTONE Take 25 mg by mouth daily.   torsemide 20 MG tablet Commonly known as: DEMADEX Take 80 mg by mouth daily.   Evaristo Bury FlexTouch 100 UNIT/ML FlexTouch Pen Generic drug: insulin degludec 120 Units daily.        Allergies  Allergen Reactions   Atorvastatin Other (See Comments)    Other reaction(s): Not available Other reaction(s): Other (See Comments)    Red Dye #40 (Allura Red) Other (See Comments)    "not as allergic to it as I used to be. I can take a little of it"    Consultations: Nephrology ID Vascular   Procedures/Studies: US Venous Img Lower Unilateral Right  Result Date: 04/29/2023 CLINICAL DATA:  Leg pain EXAM: RIGHT LOWER EXTREMITY VENOUS DOPPLER ULTRASOUND TECHNIQUE: Gray-scale sonography with graded compression, as well as color Doppler and duplex ultrasound were performed to evaluate the lower extremity deep venous systems from the level  of the common femoral vein and including the common femoral, femoral, profunda femoral, popliteal and calf veins including the posterior tibial, peroneal and gastrocnemius veins when visible. The superficial great saphenous vein was also interrogated. Spectral Doppler was utilized to evaluate flow at rest and with distal augmentation maneuvers in the common femoral, femoral and popliteal veins. COMPARISON:  04/13/23 bilateral lower extremity ultrasound FINDINGS: Contralateral Common Femoral Vein: Respiratory phasicity is normal and symmetric with the symptomatic side. No evidence of thrombus. Normal compressibility. Common Femoral Vein: No evidence of thrombus. Normal compressibility, respiratory phasicity and response to augmentation. Saphenofemoral Junction: No evidence of thrombus. Normal compressibility and flow on color Doppler imaging. Profunda  Femoral Vein: No evidence of thrombus. Normal compressibility and flow on color Doppler imaging. Femoral Vein: No evidence of thrombus. Normal compressibility, respiratory phasicity and response to augmentation. Popliteal Vein: No evidence of thrombus. Normal compressibility, respiratory phasicity and response to augmentation. Calf Veins: Poorly visualized on grayscale imaging, but without evidence of thrombus on color Doppler evaluation. Superficial Great Saphenous Vein: No evidence of thrombus. Normal compressibility. Venous Reflux:  None. Other Findings:  None. IMPRESSION: No evidence of deep venous thrombosis. Electronically Signed   By: Lorenza Cambridge M.D.   On: 04/29/2023 09:11   DG Chest Port 1 View  Result Date: 04/29/2023 CLINICAL DATA:  72 year old male with history of chest pain. EXAM: PORTABLE CHEST 1 VIEW COMPARISON:  Chest x-ray 04/14/2023. FINDINGS: Right internal jugular central venous catheter with tip terminating at the superior cavoatrial junction. Lung volumes are low. Elevation of the right hemidiaphragm. Bibasilar opacities (right greater than left) favored to reflect subsegmental atelectasis. No definite consolidative airspace disease. No pleural effusions. No pneumothorax. Cephalization of the pulmonary vasculature, without frank pulmonary edema. Heart size is mildly enlarged. Upper mediastinal contours are within normal limits. IMPRESSION: 1. Support apparatus, as above. 2. Low lung volumes with bibasilar subsegmental atelectasis. 3. Cardiomegaly with pulmonary venous congestion, but no frank pulmonary edema. Electronically Signed   By: Trudie Reed M.D.   On: 04/29/2023 06:31      Subjective: Seen and examined on the day of discharge.  Stable no distress.  Appropriate for discharge home.  Discharge Exam: Vitals:   05/26/23 1010 05/26/23 1515  BP: (!) 121/56 120/64  Pulse:  77  Resp:  18  Temp:    SpO2:  93%   Vitals:   05/26/23 0356 05/26/23 0745 05/26/23 1010  05/26/23 1515  BP: 128/63 (!) 116/56 (!) 121/56 120/64  Pulse: 75 75  77  Resp: 18 18  18   Temp: 97.9 F (36.6 C) 97.7 F (36.5 C)    TempSrc: Oral Oral    SpO2: 100% 95%  93%  Weight:      Height:        General: Pt is alert, awake, not in acute distress Cardiovascular: RRR, S1/S2 +, no rubs, no gallops Respiratory: CTA bilaterally, no wheezing, no rhonchi Abdominal: Soft, NT, ND, bowel sounds + Extremities: no edema, no cyanosis    The results of significant diagnostics from this hospitalization (including imaging, microbiology, ancillary and laboratory) are listed below for reference.     Microbiology: No results found for this or any previous visit (from the past 240 hour(s)).   Labs: BNP (last 3 results) Recent Labs    04/12/23 1305  BNP 89.7   Basic Metabolic Panel: Recent Labs  Lab 05/23/23 1329 05/24/23 0533 05/25/23 0631 05/26/23 0841  NA 130* 131* 131* 133*  K 3.7 3.9 3.6 3.8  CL 88* 92* 95* 96*  CO2 28 29 24 25   GLUCOSE 178* 178* 164* 150*  BUN 94* 103* 110* 113*  CREATININE 3.32* 3.23* 3.37* 3.45*  CALCIUM 14.2* 13.5* 11.8* 11.4*   Liver Function Tests: Recent Labs  Lab 05/23/23 1329 05/25/23 0631  AST 192* 112*  ALT 89* 76*  ALKPHOS 120 90  BILITOT 0.6 0.4  PROT 8.0 7.3  ALBUMIN 3.8 3.3*   No results for input(s): "LIPASE", "AMYLASE" in the last 168 hours. No results for input(s): "AMMONIA" in the last 168 hours. CBC: Recent Labs  Lab 05/23/23 1329 05/24/23 0533  WBC 15.1* 15.2*  NEUTROABS 11.0*  --   HGB 11.6* 11.0*  HCT 35.4* 33.0*  MCV 93.2 91.4  PLT 285 290   Cardiac Enzymes: Recent Labs  Lab 05/23/23 1329 05/24/23 0533 05/26/23 0841  CKTOTAL 1,533* 801* 185   BNP: Invalid input(s): "POCBNP" CBG: Recent Labs  Lab 05/25/23 1721 05/25/23 2116 05/26/23 0744 05/26/23 1151 05/26/23 1452  GLUCAP 195* 209* 159* 245* 192*   D-Dimer No results for input(s): "DDIMER" in the last 72 hours. Hgb A1c No results for  input(s): "HGBA1C" in the last 72 hours. Lipid Profile No results for input(s): "CHOL", "HDL", "LDLCALC", "TRIG", "CHOLHDL", "LDLDIRECT" in the last 72 hours. Thyroid function studies No results for input(s): "TSH", "T4TOTAL", "T3FREE", "THYROIDAB" in the last 72 hours.  Invalid input(s): "FREET3" Anemia work up No results for input(s): "VITAMINB12", "FOLATE", "FERRITIN", "TIBC", "IRON", "RETICCTPCT" in the last 72 hours. Urinalysis    Component Value Date/Time   COLORURINE YELLOW (A) 09/25/2018 2059   APPEARANCEUR Clear 01/05/2022 1324   LABSPEC 1.012 09/25/2018 2059   PHURINE 7.0 09/25/2018 2059   GLUCOSEU Negative 01/05/2022 1324   HGBUR SMALL (A) 09/25/2018 2059   BILIRUBINUR Negative 01/05/2022 1324   KETONESUR NEGATIVE 09/25/2018 2059   PROTEINUR Negative 01/05/2022 1324   PROTEINUR NEGATIVE 09/25/2018 2059   NITRITE Negative 01/05/2022 1324   NITRITE NEGATIVE 09/25/2018 2059   LEUKOCYTESUR Negative 01/05/2022 1324   Sepsis Labs Recent Labs  Lab 05/23/23 1329 05/24/23 0533  WBC 15.1* 15.2*   Microbiology No results found for this or any previous visit (from the past 240 hour(s)).   Time coordinating discharge: Over 30 minutes  SIGNED:   Tresa Moore, MD  [facility] 05/26/2023, 3:52 PM Pager   If 7PM-7AM, please contact night-coverage

## 2023-05-26 NOTE — Progress Notes (Signed)
Mobility Specialist - Progress Note     05/26/23 1010  Therapy Vitals  BP (!) 121/56  Mobility  Activity Ambulated with assistance in hallway  Level of Assistance Standby assist, set-up cues, supervision of patient - no hands on  Assistive Device Other (Comment)  Distance Ambulated (ft) 80 ft  Range of Motion/Exercises Active  Activity Response Tolerated well  Mobility Referral Yes  $Mobility charge 1 Mobility  Mobility Specialist Start Time (ACUTE ONLY) [id number]  Mobility Specialist Stop Time (ACUTE ONLY) 1011  Mobility Specialist Time Calculation (min) (ACUTE ONLY) 15 min   Pt resting EOB upon entry on RA. Pt STS and ambulates to hallway around NS SBA with RW. Pt ambulates 40 ft. Before becoming winded and took a seated rest break. BP 121/56 and pulse 89 and recovered to 94% after 2 minute uninterrupted breathing. Pt returned to EOB long sitting and left with needs in reach.   Johnathan Hausen Mobility Specialist 05/26/23, 10:16 AM

## 2023-05-26 NOTE — Care Management Important Message (Signed)
Important Message  Patient Details  Name: Jason Hudson MRN: [id number] Date of Birth: [birth year]   Important Message Given:  Yes - Medicare IM     [name] 05/26/2023, 2:05 PM

## 2023-05-26 NOTE — Progress Notes (Signed)
PHARMACIST - PHYSICIAN COMMUNICATION  CONCERNING: Antibiotic IV to Oral Route Change Policy  RECOMMENDATION: This patient is receiving doxycycline by the intravenous route.  Based on criteria approved by the Pharmacy and Therapeutics Committee, the antibiotic(s) is/are being converted to the equivalent oral dose form(s).  DESCRIPTION: These criteria include: Patient being treated for a respiratory tract infection, urinary tract infection, cellulitis or clostridium difficile associated diarrhea if on metronidazole The patient is not neutropenic and does not exhibit a GI malabsorption state The patient is eating (either orally or via tube) and/or has been taking other orally administered medications for a least 24 hours The patient is improving clinically and has a Tmax < 100.5  If you have questions about this conversion, please contact the Pharmacy Department   Tressie Ellis 05/26/23

## 2023-05-26 NOTE — TOC Transition Note (Signed)
Transition of Care [facility]) - CM/SW Discharge Note   Patient Details  Name: Jason Hudson MRN: [id number] Date of Birth: 1950/11/27  Transition of [facility] CM/SW Contact:  Chapman Fitch, RN Phone Number: 05/26/2023, 4:01 PM   Clinical Narrative:      Notified patient will be discharge today on oral antibiotics.  Will no longer need IV antibiotics at home Pam with [employer] notified Barbara Cower with [facility] notified        Patient Goals and CMS Choice      Discharge Placement                         Discharge Plan and Services Additional resources added to the After Visit Summary for                            [facility] Arranged: PT, OT, RN [facility] Agency: [facility] Date [facility] Agency Contacted: 05/24/23   Representative spoke with at [facility] Agency: Barbara Cower  Social Determinants of Health (SDOH) Interventions SDOH Screenings   Food Insecurity: No Food Insecurity (05/23/2023)  Housing: Low Risk  (05/23/2023)  Transportation Needs: No Transportation Needs (05/23/2023)  Utilities: Not At Risk (05/23/2023)  Alcohol Screen: Low Risk  (05/11/2022)  Depression (PHQ2-9): Low Risk  (05/23/2023)  Financial Resource Strain: Low Risk  (05/17/2023)   Received from [facility]  Tobacco Use: Low Risk  (05/23/2023)   Received from [facility]     Readmission Risk Interventions    05/26/2023    4:00 PM 05/26/2023    9:33 AM 04/14/2023   10:31 AM  Readmission Risk Prevention Plan  Transportation Screening  Complete Complete  PCP or Specialist Appt within 3-5 Days   Complete  Home Care Screening  Complete   Medication Review (RN CM)  Complete   HRI or Home Care Consult Complete  Complete  Social Work Consult for Recovery Care Planning/Counseling Complete  Complete  Palliative Care Screening Not Applicable  Not Complete  Medication Review Oceanographer) Complete  Complete

## 2023-05-26 NOTE — Interval H&P Note (Signed)
History and Physical Interval Note:  05/26/2023 4:00 PM  Jason Hudson  has presented today for surgery, with the diagnosis of rhabdomyolysis.  The various methods of treatment have been discussed with the patient and family. After consideration of risks, benefits and other options for treatment, the patient has consented to  Procedure(s): DIALYSIS/PERMA CATHETER REMOVAL (N/A) as a surgical intervention.  The patient's history has been reviewed, patient examined, no change in status, stable for surgery.  I have reviewed the patient's chart and labs.  Questions were answered to the patient's satisfaction.     Festus Barren

## 2023-05-26 NOTE — Progress Notes (Signed)
Jason Hudson to be discharged Home per MD order. Discussed prescriptions and follow up appointments with the patient. Prescriptions given to patient, medication list explained in detail. Patient verbalized understanding.  Allergies as of 05/26/2023       Reactions   Atorvastatin Other (See Comments)   Other reaction(s): Not available Other reaction(s): Other (See Comments)   Red Dye #40 (allura Red) Other (See Comments)   "not as allergic to it as I used to be. I can take a little of it"        Medication List     STOP taking these medications    calcitRIOL 0.25 MCG capsule Commonly known as: ROCALTROL   carvedilol 6.25 MG tablet Commonly known as: COREG   cefTRIAXone IVPB Commonly known as: ROCEPHIN   daptomycin IVPB Commonly known as: CUBICIN   Vitamin D3 50 MCG (2000 UT) Tabs       TAKE these medications    albuterol 108 (90 Base) MCG/ACT inhaler Commonly known as: VENTOLIN HFA Inhale 1-2 puffs into the lungs every 6 (six) hours as needed for wheezing or shortness of breath.   allopurinol 100 MG tablet Commonly known as: ZYLOPRIM TAKE ONE TABLET (100 MG) BY MOUTH ONCE DAILY   amiodarone 200 MG tablet Commonly known as: PACERONE Take 1 tablet (200 mg total) by mouth daily.   aspirin EC 81 MG tablet Take 81 mg by mouth daily. Swallow whole.   budesonide-formoterol 160-4.5 MCG/ACT inhaler Commonly known as: SYMBICORT Inhale 2 puffs into the lungs 2 (two) times daily.   cefadroxil 500 MG capsule Commonly known as: DURICEF Take 1 capsule (500 mg total) by mouth 2 (two) times daily.   diphenhydrAMINE 25 mg capsule Commonly known as: BENADRYL Take 25 mg by mouth every 6 (six) hours as needed for itching or allergies.   doxazosin 4 MG tablet Commonly known as: CARDURA TAKE 1 TABLET BY MOUTH AT BEDTIME   doxycycline 100 MG tablet Commonly known as: VIBRA-TABS Take 1 tablet (100 mg total) by mouth every 12 (twelve) hours.   famotidine 20 MG  tablet Commonly known as: PEPCID Take 1 tablet (20 mg total) by mouth 2 (two) times daily.   glipiZIDE 5 MG 24 hr tablet Commonly known as: GLUCOTROL XL Take 10 mg by mouth daily with breakfast.   ipratropium 0.02 % nebulizer solution Commonly known as: ATROVENT Take 2.5 mLs (0.5 mg total) by nebulization every 6 (six) hours as needed for wheezing or shortness of breath.   isosorbide mononitrate 30 MG 24 hr tablet Commonly known as: IMDUR Take 30 mg by mouth daily.   levothyroxine 112 MCG tablet Commonly known as: SYNTHROID Take 112 mcg by mouth daily before breakfast.   loratadine 10 MG tablet Commonly known as: CLARITIN Take 10 mg by mouth daily.   multivitamin Tabs tablet Take 1 tablet by mouth daily.   nitroGLYCERIN 0.4 MG SL tablet Commonly known as: NITROSTAT Place 1 tablet (0.4 mg total) under the tongue every 5 (five) minutes as needed for chest pain.   oxycodone 5 MG capsule Commonly known as: OXY-IR Take 5 mg by mouth every 6 (six) hours as needed for pain.   pantoprazole 40 MG tablet Commonly known as: PROTONIX Take 1 tablet (40 mg total) by mouth daily.   potassium chloride 10 MEQ tablet Commonly known as: KLOR-CON TAKE TWO TABLETS EVERY OTHER DAY AND 1 TABLET ON THE DAYS IN BETWEEN What changed: See the new instructions.   spironolactone 25 MG tablet Commonly  known as: ALDACTONE Take 25 mg by mouth daily.   torsemide 20 MG tablet Commonly known as: DEMADEX Take 80 mg by mouth daily.   Evaristo Bury FlexTouch 100 UNIT/ML FlexTouch Pen Generic drug: insulin degludec 120 Units daily.        Vitals:   05/26/23 1654 05/26/23 1738  BP: 116/65 116/61  Pulse: 78 78  Resp: 20 18  Temp:  98 F (36.7 C)  SpO2: 95% 96%    An After Visit Summary was printed and given to the patient. Patient escorted via wheelchair and discharged Home home via private auto.  Madie Reno, RN

## 2023-05-27 DIAGNOSIS — M4624 Osteomyelitis of vertebra, thoracic region: Secondary | ICD-10-CM | POA: Diagnosis not present

## 2023-05-28 LAB — MULTIPLE MYELOMA PANEL, SERUM
Albumin SerPl Elph-Mcnc: 3.5 g/dL (ref 2.9–4.4)
Albumin/Glob SerPl: 0.9 (ref 0.7–1.7)
Alpha 1: 0.2 g/dL (ref 0.0–0.4)
Alpha2 Glob SerPl Elph-Mcnc: 1 g/dL (ref 0.4–1.0)
B-Globulin SerPl Elph-Mcnc: 1.6 g/dL — ABNORMAL HIGH (ref 0.7–1.3)
Gamma Glob SerPl Elph-Mcnc: 1.2 g/dL (ref 0.4–1.8)
Globulin, Total: 4 g/dL — ABNORMAL HIGH (ref 2.2–3.9)
IgA: 687 mg/dL — ABNORMAL HIGH (ref 61–437)
IgG (Immunoglobin G), Serum: 1365 mg/dL (ref 603–1613)
IgM (Immunoglobulin M), Srm: 86 mg/dL (ref 20–172)
Total Protein ELP: 7.5 g/dL (ref 6.0–8.5)

## 2023-05-29 ENCOUNTER — Encounter: Payer: Self-pay | Admitting: Vascular Surgery

## 2023-05-29 DIAGNOSIS — M4624 Osteomyelitis of vertebra, thoracic region: Secondary | ICD-10-CM | POA: Diagnosis not present

## 2023-05-29 DIAGNOSIS — N184 Chronic kidney disease, stage 4 (severe): Secondary | ICD-10-CM | POA: Diagnosis not present

## 2023-05-29 MED ORDER — LIDOCAINE-EPINEPHRINE (PF) 1 %-1:200000 IJ SOLN
INTRAMUSCULAR | Status: AC | PRN
  Administered 2023-05-26: 20 mL

## 2023-05-30 DIAGNOSIS — I130 Hypertensive heart and chronic kidney disease with heart failure and stage 1 through stage 4 chronic kidney disease, or unspecified chronic kidney disease: Secondary | ICD-10-CM | POA: Diagnosis not present

## 2023-05-30 DIAGNOSIS — E1165 Type 2 diabetes mellitus with hyperglycemia: Secondary | ICD-10-CM | POA: Diagnosis not present

## 2023-05-30 DIAGNOSIS — I5023 Acute on chronic systolic (congestive) heart failure: Secondary | ICD-10-CM | POA: Diagnosis not present

## 2023-05-30 DIAGNOSIS — E1122 Type 2 diabetes mellitus with diabetic chronic kidney disease: Secondary | ICD-10-CM | POA: Diagnosis not present

## 2023-05-30 DIAGNOSIS — M4624 Osteomyelitis of vertebra, thoracic region: Secondary | ICD-10-CM | POA: Diagnosis not present

## 2023-05-30 DIAGNOSIS — Z452 Encounter for adjustment and management of vascular access device: Secondary | ICD-10-CM | POA: Diagnosis not present

## 2023-05-30 DIAGNOSIS — I480 Paroxysmal atrial fibrillation: Secondary | ICD-10-CM | POA: Diagnosis not present

## 2023-05-31 DIAGNOSIS — R809 Proteinuria, unspecified: Secondary | ICD-10-CM | POA: Diagnosis not present

## 2023-05-31 DIAGNOSIS — N189 Chronic kidney disease, unspecified: Secondary | ICD-10-CM | POA: Diagnosis not present

## 2023-05-31 DIAGNOSIS — E1122 Type 2 diabetes mellitus with diabetic chronic kidney disease: Secondary | ICD-10-CM | POA: Diagnosis not present

## 2023-05-31 DIAGNOSIS — R600 Localized edema: Secondary | ICD-10-CM | POA: Diagnosis not present

## 2023-05-31 DIAGNOSIS — N2581 Secondary hyperparathyroidism of renal origin: Secondary | ICD-10-CM | POA: Diagnosis not present

## 2023-05-31 DIAGNOSIS — I129 Hypertensive chronic kidney disease with stage 1 through stage 4 chronic kidney disease, or unspecified chronic kidney disease: Secondary | ICD-10-CM | POA: Diagnosis not present

## 2023-05-31 DIAGNOSIS — N184 Chronic kidney disease, stage 4 (severe): Secondary | ICD-10-CM | POA: Diagnosis not present

## 2023-06-01 DIAGNOSIS — D692 Other nonthrombocytopenic purpura: Secondary | ICD-10-CM | POA: Diagnosis not present

## 2023-06-01 DIAGNOSIS — I130 Hypertensive heart and chronic kidney disease with heart failure and stage 1 through stage 4 chronic kidney disease, or unspecified chronic kidney disease: Secondary | ICD-10-CM | POA: Diagnosis not present

## 2023-06-01 DIAGNOSIS — N184 Chronic kidney disease, stage 4 (severe): Secondary | ICD-10-CM | POA: Diagnosis not present

## 2023-06-01 DIAGNOSIS — I4891 Unspecified atrial fibrillation: Secondary | ICD-10-CM | POA: Diagnosis not present

## 2023-06-01 DIAGNOSIS — I5030 Unspecified diastolic (congestive) heart failure: Secondary | ICD-10-CM | POA: Diagnosis not present

## 2023-06-01 DIAGNOSIS — K5900 Constipation, unspecified: Secondary | ICD-10-CM | POA: Diagnosis not present

## 2023-06-01 DIAGNOSIS — M869 Osteomyelitis, unspecified: Secondary | ICD-10-CM | POA: Diagnosis not present

## 2023-06-01 DIAGNOSIS — Z515 Encounter for palliative care: Secondary | ICD-10-CM | POA: Diagnosis not present

## 2023-06-01 DIAGNOSIS — I739 Peripheral vascular disease, unspecified: Secondary | ICD-10-CM | POA: Diagnosis not present

## 2023-06-05 DIAGNOSIS — R0902 Hypoxemia: Secondary | ICD-10-CM | POA: Diagnosis not present

## 2023-06-06 DIAGNOSIS — N2581 Secondary hyperparathyroidism of renal origin: Secondary | ICD-10-CM | POA: Diagnosis not present

## 2023-06-06 DIAGNOSIS — R600 Localized edema: Secondary | ICD-10-CM | POA: Diagnosis not present

## 2023-06-06 DIAGNOSIS — R809 Proteinuria, unspecified: Secondary | ICD-10-CM | POA: Diagnosis not present

## 2023-06-06 DIAGNOSIS — N184 Chronic kidney disease, stage 4 (severe): Secondary | ICD-10-CM | POA: Diagnosis not present

## 2023-06-06 DIAGNOSIS — E1122 Type 2 diabetes mellitus with diabetic chronic kidney disease: Secondary | ICD-10-CM | POA: Diagnosis not present

## 2023-06-06 DIAGNOSIS — I129 Hypertensive chronic kidney disease with stage 1 through stage 4 chronic kidney disease, or unspecified chronic kidney disease: Secondary | ICD-10-CM | POA: Diagnosis not present

## 2023-06-06 DIAGNOSIS — N189 Chronic kidney disease, unspecified: Secondary | ICD-10-CM | POA: Diagnosis not present

## 2023-06-06 DIAGNOSIS — I509 Heart failure, unspecified: Secondary | ICD-10-CM | POA: Diagnosis not present

## 2023-06-08 DIAGNOSIS — Z794 Long term (current) use of insulin: Secondary | ICD-10-CM | POA: Diagnosis not present

## 2023-06-08 DIAGNOSIS — E039 Hypothyroidism, unspecified: Secondary | ICD-10-CM | POA: Diagnosis not present

## 2023-06-08 DIAGNOSIS — E1122 Type 2 diabetes mellitus with diabetic chronic kidney disease: Secondary | ICD-10-CM | POA: Diagnosis not present

## 2023-06-08 DIAGNOSIS — I5022 Chronic systolic (congestive) heart failure: Secondary | ICD-10-CM | POA: Diagnosis not present

## 2023-06-08 DIAGNOSIS — N184 Chronic kidney disease, stage 4 (severe): Secondary | ICD-10-CM | POA: Diagnosis not present

## 2023-06-08 DIAGNOSIS — N2581 Secondary hyperparathyroidism of renal origin: Secondary | ICD-10-CM | POA: Diagnosis not present

## 2023-06-08 DIAGNOSIS — Z09 Encounter for follow-up examination after completed treatment for conditions other than malignant neoplasm: Secondary | ICD-10-CM | POA: Diagnosis not present

## 2023-06-08 DIAGNOSIS — M4644 Discitis, unspecified, thoracic region: Secondary | ICD-10-CM | POA: Diagnosis not present

## 2023-06-15 ENCOUNTER — Other Ambulatory Visit: Payer: Self-pay | Admitting: Nurse Practitioner

## 2023-06-15 DIAGNOSIS — M1A3310 Chronic gout due to renal impairment, right wrist, without tophus (tophi): Secondary | ICD-10-CM

## 2023-06-27 ENCOUNTER — Ambulatory Visit: Payer: PPO | Attending: Infectious Diseases | Admitting: Infectious Diseases

## 2023-06-27 VITALS — BP 122/70 | HR 87 | Temp 98.0°F

## 2023-06-27 DIAGNOSIS — R7881 Bacteremia: Secondary | ICD-10-CM | POA: Insufficient documentation

## 2023-06-27 DIAGNOSIS — I480 Paroxysmal atrial fibrillation: Secondary | ICD-10-CM | POA: Insufficient documentation

## 2023-06-27 DIAGNOSIS — E1122 Type 2 diabetes mellitus with diabetic chronic kidney disease: Secondary | ICD-10-CM | POA: Diagnosis not present

## 2023-06-27 DIAGNOSIS — E1169 Type 2 diabetes mellitus with other specified complication: Secondary | ICD-10-CM | POA: Insufficient documentation

## 2023-06-27 DIAGNOSIS — N186 End stage renal disease: Secondary | ICD-10-CM | POA: Diagnosis not present

## 2023-06-27 DIAGNOSIS — I509 Heart failure, unspecified: Secondary | ICD-10-CM | POA: Diagnosis not present

## 2023-06-27 DIAGNOSIS — Z794 Long term (current) use of insulin: Secondary | ICD-10-CM | POA: Insufficient documentation

## 2023-06-27 DIAGNOSIS — B954 Other streptococcus as the cause of diseases classified elsewhere: Secondary | ICD-10-CM

## 2023-06-27 DIAGNOSIS — M4644 Discitis, unspecified, thoracic region: Secondary | ICD-10-CM

## 2023-06-27 DIAGNOSIS — E669 Obesity, unspecified: Secondary | ICD-10-CM | POA: Diagnosis not present

## 2023-06-27 DIAGNOSIS — I132 Hypertensive heart and chronic kidney disease with heart failure and with stage 5 chronic kidney disease, or end stage renal disease: Secondary | ICD-10-CM | POA: Insufficient documentation

## 2023-06-27 DIAGNOSIS — Z992 Dependence on renal dialysis: Secondary | ICD-10-CM | POA: Diagnosis not present

## 2023-06-27 NOTE — Patient Instructions (Addendum)
VISIT SUMMARY:  During your visit, we discussed the resolution of your back pain and infection, the new skin lesion you noticed, and your history of high calcium levels. We also reviewed your general health maintenance and follow-up care.  YOUR PLAN:  -OSTEOMYELITIS: Osteomyelitis is an infection in the bone. Your symptoms have resolved after completing the antibiotic course, and there are no further signs of infection. No further follow-up is needed for this issue.  -SKIN LESION: You have a new skin lesion that appears to be a blackhead. Popping these lesions can introduce infection, so it is advised to avoid doing so. Apply triple antibiotic ointment to the lesion.  -HYPERCALCEMIA: Hypercalcemia is a condition where there is too much calcium in your blood. Your condition has resolved after stopping Telmisartan and reducing your intake of Tums. No further action is needed at this time.  -GENERAL HEALTH MAINTENANCE: You have had multiple follow-ups with your primary care doctor and specialists since your hospital discharge. Continue with these follow-ups as scheduled to maintain your overall health.  INSTRUCTIONS:  Continue with your scheduled follow-ups with your primary care doctor and specialists to ensure your overall health is maintained. Please ask your PCP to check ESR/CRP with the next blood draw

## 2023-06-27 NOTE — Progress Notes (Signed)
NAME: Jason Hudson  DOB: October 12, 1950  MRN: [id number]  Date/Time: 06/27/2023 9:08 AM   Subjective:   ? Jason Hudson is a 72 y.o.male with ckd, PAF  is here for follow up  for staph lugdunensis bacteremia and Thoracic discitis was intially in hospital 8/21-8/30   he was discharged on Iv daptomycin ( adjusted to Crcl) and ceftriaxone for 6 weeks, he was readmitted to the hospital with hypercalcemia and worsening cr and cpk on 10/1 and was discharged on 10/4 to rehab and he is back home. HE saw his PCP nd renal after discharge HE has completed 38 days of IV dapto and ceftriaxone followed by 30 days of cefadroxil and doxy. He  is doing much better His last ca is 8.9- he was taking too much tums and was asked to stop Vitamin D, tums etc.    Past Medical History:  Diagnosis Date   CHF (congestive heart failure) (HCC)    COPD (chronic obstructive pulmonary disease) (HCC)    Diabetes mellitus without complication (HCC)    Edema    leg swelling   Gastroesophageal reflux disease 12/15/2017   Gout    Hypertension    Melena 11/05/2017   Pneumonia 07/27/2016    Past Surgical History:  Procedure Laterality Date   CENTRAL LINE INSERTION-TUNNELED N/A 04/21/2023   Procedure: CENTRAL LINE INSERTION-TUNNELED;  Surgeon: Gilda Crease Latina Craver, MD;  Location: [facility] INVASIVE CV LAB;  Service: Cardiovascular;  Laterality: N/A;   COLONOSCOPY WITH PROPOFOL N/A 11/07/2017   Procedure: COLONOSCOPY WITH PROPOFOL;  Surgeon: Wyline Mood, MD;  Location: [facility] ENDOSCOPY;  Service: Gastroenterology;  Laterality: N/A;   DIALYSIS/PERMA CATHETER REMOVAL N/A 05/26/2023   Procedure: DIALYSIS/PERMA CATHETER REMOVAL;  Surgeon: Annice Needy, MD;  Location: [facility] INVASIVE CV LAB;  Service: Cardiovascular;  Laterality: N/A;   ESOPHAGOGASTRODUODENOSCOPY (EGD) WITH PROPOFOL N/A 11/07/2017   Procedure: ESOPHAGOGASTRODUODENOSCOPY (EGD) WITH PROPOFOL;  Surgeon: Wyline Mood, MD;  Location: [facility] ENDOSCOPY;  Service:  Gastroenterology;  Laterality: N/A;   LEFT HEART CATH AND CORONARY ANGIOGRAPHY N/A 10/23/2017   Procedure: LEFT HEART CATH AND CORONARY ANGIOGRAPHY;  Surgeon: Marcina Millard, MD;  Location: [facility] INVASIVE CV LAB;  Service: Cardiovascular;  Laterality: N/A;    Social History   Socioeconomic History   Marital status: Single    Spouse name: Not on file   Number of children: Not on file   Years of education: Not on file   Highest education level: Not on file  Occupational History   Not on file  Tobacco Use   Smoking status: Never   Smokeless tobacco: Never  Substance and Sexual Activity   Alcohol use: Not Currently    Comment: ocassionally   Drug use: Never   Sexual activity: Not Currently  Other Topics Concern   Not on file  Social History Narrative   Not on file   Social Determinants of Health   Financial Resource Strain: Low Risk  (05/17/2023)   Received from [facility]   Overall Financial Resource Strain (CARDIA)    Difficulty of Paying Living Expenses: Not hard at all  Food Insecurity: No Food Insecurity (05/23/2023)   Hunger Vital Sign    Worried About Running Out of Food in the Last Year: Never true    Ran Out of Food in the Last Year: Never true  Transportation Needs: No Transportation Needs (05/23/2023)   PRAPARE - Administrator, Civil Service (Medical): No    Lack of Transportation (Non-Medical): No  Physical Activity: Not on file  Stress: Not on file  Social Connections: Not on file  Intimate Partner Violence: Not At Risk (05/23/2023)   Humiliation, Afraid, Rape, and Kick questionnaire    Fear of Current or Ex-Partner: No    Emotionally Abused: No    Physically Abused: No    Sexually Abused: No    Family History  Problem Relation Age of Onset   Cancer Mother    CVA Father    Heart disease Father    Allergies  Allergen Reactions   Atorvastatin Other (See Comments)    Other reaction(s): Not available Other reaction(s):  Other (See Comments)    Red Dye #40 (Allura Red) Other (See Comments)    "not as allergic to it as I used to be. I can take a little of it"   I? Current Outpatient Medications  Medication Sig Dispense Refill   albuterol (VENTOLIN HFA) 108 (90 Base) MCG/ACT inhaler Inhale 1-2 puffs into the lungs every 6 (six) hours as needed for wheezing or shortness of breath. 1 each 5   allopurinol (ZYLOPRIM) 100 MG tablet TAKE ONE TABLET (100 MG) BY MOUTH ONCE DAILY 90 tablet 1   aspirin EC 81 MG tablet Take 81 mg by mouth daily. Swallow whole.     budesonide-formoterol (SYMBICORT) 160-4.5 MCG/ACT inhaler Inhale 2 puffs into the lungs 2 (two) times daily.     colchicine 0.6 MG tablet Take 0.6 mg by mouth daily.     diphenhydrAMINE (BENADRYL) 25 mg capsule Take 25 mg by mouth every 6 (six) hours as needed for itching or allergies.     ipratropium (ATROVENT) 0.02 % nebulizer solution Take 2.5 mLs (0.5 mg total) by nebulization every 6 (six) hours as needed for wheezing or shortness of breath. 225 mL 1   isosorbide mononitrate (IMDUR) 30 MG 24 hr tablet Take 30 mg by mouth daily.     levothyroxine (SYNTHROID) 112 MCG tablet Take 112 mcg by mouth daily before breakfast.     loratadine (CLARITIN) 10 MG tablet Take 10 mg by mouth daily.      multivitamin (ONE-A-DAY MEN'S) TABS tablet Take 1 tablet by mouth daily.     nitroGLYCERIN (NITROSTAT) 0.4 MG SL tablet Place 1 tablet (0.4 mg total) under the tongue every 5 (five) minutes as needed for chest pain. 30 tablet 0   oxycodone (OXY-IR) 5 MG capsule Take 5 mg by mouth every 6 (six) hours as needed for pain.     potassium chloride (KLOR-CON) 10 MEQ tablet TAKE TWO TABLETS EVERY OTHER DAY AND 1 TABLET ON THE DAYS IN BETWEEN (Patient taking differently: Take 20 mEq by mouth daily.) 180 tablet 1   spironolactone (ALDACTONE) 25 MG tablet Take 25 mg by mouth daily.     torsemide (DEMADEX) 20 MG tablet Take 80 mg by mouth daily.     TRESIBA FLEXTOUCH 100 UNIT/ML  FlexTouch Pen 120 Units daily.     amiodarone (PACERONE) 200 MG tablet Take 1 tablet (200 mg total) by mouth daily. 30 tablet 0   No current facility-administered medications for this visit.   Facility-Administered Medications Ordered in Other Visits  Medication Dose Route Frequency Provider Last Rate Last Admin   lidocaine-EPINEPHrine (PF) (XYLOCAINE-EPINEPHrine) 1 %-1:200000 (PF) injection    PRN Annice Needy, MD   20 mL at 05/26/23 1648     Abtx:  Anti-infectives (From admission, onward)    None       REVIEW OF SYSTEMS:  Const: negative fever,  negative chills, negative weight loss Eyes: negative diplopia or visual changes, negative eye pain ENT: negative coryza, negative sore throat Resp: negative cough, hemoptysis, dyspnea Cards: negative for chest pain, palpitations, lower extremity edema GU: negative for frequency, dysuria and hematuria GI: Negative for abdominal pain, diarrhea, bleeding, constipation Skin:  pruritus Heme: negative for easy bruising and gum/nose bleeding MS: weakness , legs wobbly Neurolo:negative for headaches, dizziness, vertigo, memory problems  Psych:  anxiety, depression  Endocrine:  diabetes Allergy/Immunology- atorvastatin  Objective:  VITALS:  BP 122/70   Pulse 87   Temp 98 F (36.7 C) (Temporal)   SpO2 93%    PHYSICAL EXAM:  General: Alert, cooperative, Head: Normocephalic, without obvious abnormality, atraumatic. Eyes: Conjunctivae clear, anicteric sclerae. Pupils are equal ENT Nares normal. No drainage or sinus tenderness. Lips, mucosa, and tongue normal. No Thrush Neck: Supple, symmetrical, no adenopathy, thyroid: non tender no carotid bruit and no JVD. Back: No CVA tenderness. Lungs: Clear to auscultation bilaterally. No Wheezing or Rhonchi. No rales. Heart: Regular rate and rhythm, no murmur, rub or gallop. Abdomen: did not examine Extremities: atraumatic, no cyanosis. No edema. No clubbing Skin: No rashes or lesions. Lymph:  Cervical, supraclavicular normal. Neurologic: Grossly non-focal Pertinent Labs 10/4   Impression/Recommendation Staph lugdunensis bacteremia with T5-T6 discitis and osteomyelitis. Also has ventral epidural phlegmon and paraspinal phlegmon. No drainable fluid collection . Completed  IV daptomycin and ceftriaxone for 6 weeks, followed by a month of cefadroxil and Doxy- completed rx- doing well  CKD  PAF  CHF  Obesity  Hypercalcemia resolved  Discussed the management with patient and partner ?Discussed with patient, in detail. To get ESR/CRP at his pCP during next visit and lab draw He is discharged from my clinic

## 2023-07-05 DIAGNOSIS — Z515 Encounter for palliative care: Secondary | ICD-10-CM | POA: Diagnosis not present

## 2023-07-05 DIAGNOSIS — I482 Chronic atrial fibrillation, unspecified: Secondary | ICD-10-CM | POA: Diagnosis not present

## 2023-07-05 DIAGNOSIS — N184 Chronic kidney disease, stage 4 (severe): Secondary | ICD-10-CM | POA: Diagnosis not present

## 2023-07-05 DIAGNOSIS — Z6841 Body Mass Index (BMI) 40.0 and over, adult: Secondary | ICD-10-CM | POA: Diagnosis not present

## 2023-07-05 DIAGNOSIS — I5030 Unspecified diastolic (congestive) heart failure: Secondary | ICD-10-CM | POA: Diagnosis not present

## 2023-07-05 DIAGNOSIS — J449 Chronic obstructive pulmonary disease, unspecified: Secondary | ICD-10-CM | POA: Diagnosis not present

## 2023-07-05 DIAGNOSIS — I130 Hypertensive heart and chronic kidney disease with heart failure and stage 1 through stage 4 chronic kidney disease, or unspecified chronic kidney disease: Secondary | ICD-10-CM | POA: Diagnosis not present

## 2023-07-06 DIAGNOSIS — R0902 Hypoxemia: Secondary | ICD-10-CM | POA: Diagnosis not present

## 2023-07-10 DIAGNOSIS — K581 Irritable bowel syndrome with constipation: Secondary | ICD-10-CM | POA: Diagnosis not present

## 2023-07-10 DIAGNOSIS — N184 Chronic kidney disease, stage 4 (severe): Secondary | ICD-10-CM | POA: Diagnosis not present

## 2023-07-10 DIAGNOSIS — M4644 Discitis, unspecified, thoracic region: Secondary | ICD-10-CM | POA: Diagnosis not present

## 2023-07-10 DIAGNOSIS — E1122 Type 2 diabetes mellitus with diabetic chronic kidney disease: Secondary | ICD-10-CM | POA: Diagnosis not present

## 2023-07-10 DIAGNOSIS — J449 Chronic obstructive pulmonary disease, unspecified: Secondary | ICD-10-CM | POA: Diagnosis not present

## 2023-07-10 DIAGNOSIS — G4733 Obstructive sleep apnea (adult) (pediatric): Secondary | ICD-10-CM | POA: Diagnosis not present

## 2023-07-10 DIAGNOSIS — I5022 Chronic systolic (congestive) heart failure: Secondary | ICD-10-CM | POA: Diagnosis not present

## 2023-07-10 DIAGNOSIS — Z794 Long term (current) use of insulin: Secondary | ICD-10-CM | POA: Diagnosis not present

## 2023-07-12 ENCOUNTER — Encounter: Payer: Self-pay | Admitting: Emergency Medicine

## 2023-07-12 ENCOUNTER — Other Ambulatory Visit: Payer: Self-pay

## 2023-07-12 ENCOUNTER — Emergency Department: Payer: PPO

## 2023-07-12 DIAGNOSIS — E872 Acidosis, unspecified: Secondary | ICD-10-CM | POA: Diagnosis present

## 2023-07-12 DIAGNOSIS — J449 Chronic obstructive pulmonary disease, unspecified: Secondary | ICD-10-CM | POA: Diagnosis not present

## 2023-07-12 DIAGNOSIS — N179 Acute kidney failure, unspecified: Secondary | ICD-10-CM | POA: Diagnosis present

## 2023-07-12 DIAGNOSIS — Z888 Allergy status to other drugs, medicaments and biological substances status: Secondary | ICD-10-CM

## 2023-07-12 DIAGNOSIS — R5382 Chronic fatigue, unspecified: Secondary | ICD-10-CM | POA: Diagnosis present

## 2023-07-12 DIAGNOSIS — M109 Gout, unspecified: Secondary | ICD-10-CM | POA: Diagnosis present

## 2023-07-12 DIAGNOSIS — I480 Paroxysmal atrial fibrillation: Secondary | ICD-10-CM | POA: Diagnosis present

## 2023-07-12 DIAGNOSIS — M4644 Discitis, unspecified, thoracic region: Principal | ICD-10-CM | POA: Diagnosis present

## 2023-07-12 DIAGNOSIS — I517 Cardiomegaly: Secondary | ICD-10-CM | POA: Diagnosis not present

## 2023-07-12 DIAGNOSIS — Z8249 Family history of ischemic heart disease and other diseases of the circulatory system: Secondary | ICD-10-CM

## 2023-07-12 DIAGNOSIS — E876 Hypokalemia: Secondary | ICD-10-CM | POA: Diagnosis present

## 2023-07-12 DIAGNOSIS — Z9981 Dependence on supplemental oxygen: Secondary | ICD-10-CM

## 2023-07-12 DIAGNOSIS — R918 Other nonspecific abnormal finding of lung field: Secondary | ICD-10-CM | POA: Diagnosis not present

## 2023-07-12 DIAGNOSIS — Z79899 Other long term (current) drug therapy: Secondary | ICD-10-CM

## 2023-07-12 DIAGNOSIS — Z79890 Hormone replacement therapy (postmenopausal): Secondary | ICD-10-CM

## 2023-07-12 DIAGNOSIS — M4624 Osteomyelitis of vertebra, thoracic region: Secondary | ICD-10-CM | POA: Diagnosis present

## 2023-07-12 DIAGNOSIS — I130 Hypertensive heart and chronic kidney disease with heart failure and stage 1 through stage 4 chronic kidney disease, or unspecified chronic kidney disease: Secondary | ICD-10-CM | POA: Diagnosis present

## 2023-07-12 DIAGNOSIS — R3915 Urgency of urination: Secondary | ICD-10-CM | POA: Diagnosis present

## 2023-07-12 DIAGNOSIS — Z7982 Long term (current) use of aspirin: Secondary | ICD-10-CM

## 2023-07-12 DIAGNOSIS — Z91041 Radiographic dye allergy status: Secondary | ICD-10-CM

## 2023-07-12 DIAGNOSIS — Z823 Family history of stroke: Secondary | ICD-10-CM

## 2023-07-12 DIAGNOSIS — Z794 Long term (current) use of insulin: Secondary | ICD-10-CM

## 2023-07-12 DIAGNOSIS — I5032 Chronic diastolic (congestive) heart failure: Secondary | ICD-10-CM | POA: Diagnosis present

## 2023-07-12 DIAGNOSIS — D649 Anemia, unspecified: Secondary | ICD-10-CM | POA: Diagnosis present

## 2023-07-12 DIAGNOSIS — Z6841 Body Mass Index (BMI) 40.0 and over, adult: Secondary | ICD-10-CM

## 2023-07-12 DIAGNOSIS — E66813 Obesity, class 3: Secondary | ICD-10-CM | POA: Diagnosis present

## 2023-07-12 DIAGNOSIS — I10 Essential (primary) hypertension: Secondary | ICD-10-CM | POA: Diagnosis not present

## 2023-07-12 DIAGNOSIS — N184 Chronic kidney disease, stage 4 (severe): Secondary | ICD-10-CM | POA: Diagnosis present

## 2023-07-12 DIAGNOSIS — E1165 Type 2 diabetes mellitus with hyperglycemia: Secondary | ICD-10-CM | POA: Diagnosis present

## 2023-07-12 DIAGNOSIS — A419 Sepsis, unspecified organism: Secondary | ICD-10-CM | POA: Diagnosis not present

## 2023-07-12 DIAGNOSIS — Z8619 Personal history of other infectious and parasitic diseases: Secondary | ICD-10-CM

## 2023-07-12 DIAGNOSIS — G4733 Obstructive sleep apnea (adult) (pediatric): Secondary | ICD-10-CM | POA: Diagnosis present

## 2023-07-12 DIAGNOSIS — Z7951 Long term (current) use of inhaled steroids: Secondary | ICD-10-CM

## 2023-07-12 DIAGNOSIS — E1122 Type 2 diabetes mellitus with diabetic chronic kidney disease: Secondary | ICD-10-CM | POA: Diagnosis present

## 2023-07-12 DIAGNOSIS — E1169 Type 2 diabetes mellitus with other specified complication: Secondary | ICD-10-CM | POA: Diagnosis present

## 2023-07-12 DIAGNOSIS — R0602 Shortness of breath: Secondary | ICD-10-CM | POA: Diagnosis not present

## 2023-07-12 LAB — BRAIN NATRIURETIC PEPTIDE: B Natriuretic Peptide: 46.1 pg/mL (ref 0.0–100.0)

## 2023-07-12 LAB — BASIC METABOLIC PANEL
Anion gap: 16 — ABNORMAL HIGH (ref 5–15)
BUN: 85 mg/dL — ABNORMAL HIGH (ref 8–23)
CO2: 25 mmol/L (ref 22–32)
Calcium: 10.1 mg/dL (ref 8.9–10.3)
Chloride: 88 mmol/L — ABNORMAL LOW (ref 98–111)
Creatinine, Ser: 3.66 mg/dL — ABNORMAL HIGH (ref 0.61–1.24)
GFR, Estimated: 17 mL/min — ABNORMAL LOW (ref >60)
Glucose, Bld: 328 mg/dL — ABNORMAL HIGH (ref 70–99)
Potassium: 3 mmol/L — ABNORMAL LOW (ref 3.5–5.1)
Sodium: 129 mmol/L — ABNORMAL LOW (ref 135–145)

## 2023-07-12 LAB — CBC
HCT: 36.7 % — ABNORMAL LOW (ref 39.0–52.0)
Hemoglobin: 12.2 g/dL — ABNORMAL LOW (ref 13.0–17.0)
MCH: 30.9 pg (ref 26.0–34.0)
MCHC: 33.2 g/dL (ref 30.0–36.0)
MCV: 92.9 fL (ref 80.0–100.0)
Platelets: 292 10*3/uL (ref 150–400)
RBC: 3.95 MIL/uL — ABNORMAL LOW (ref 4.22–5.81)
RDW: 15.4 % (ref 11.5–15.5)
WBC: 16 10*3/uL — ABNORMAL HIGH (ref 4.0–10.5)
nRBC: 0 % (ref 0.0–0.2)

## 2023-07-12 LAB — TROPONIN I (HIGH SENSITIVITY)
Troponin I (High Sensitivity): 30 ng/L — ABNORMAL HIGH (ref <18)
Troponin I (High Sensitivity): 34 ng/L — ABNORMAL HIGH (ref <18)

## 2023-07-12 NOTE — ED Triage Notes (Signed)
Patient to ED via POV for abnormal labs- sedimentation rate of 84 and WBC 23.3. Labs were done due to hospitalization a few weeks ago. Labs drawn 2 days ago. PT states he is having SOB and wobbly on his feet.

## 2023-07-13 ENCOUNTER — Inpatient Hospital Stay
Admission: EM | Admit: 2023-07-13 | Discharge: 2023-07-15 | DRG: 552 | Disposition: A | Discharge status: Home / Self Care | Payer: PPO | Attending: Internal Medicine | Admitting: Internal Medicine

## 2023-07-13 ENCOUNTER — Emergency Department: Payer: PPO

## 2023-07-13 ENCOUNTER — Encounter: Payer: Self-pay | Admitting: Radiology

## 2023-07-13 DIAGNOSIS — E1165 Type 2 diabetes mellitus with hyperglycemia: Secondary | ICD-10-CM | POA: Diagnosis not present

## 2023-07-13 DIAGNOSIS — Z8249 Family history of ischemic heart disease and other diseases of the circulatory system: Secondary | ICD-10-CM | POA: Diagnosis not present

## 2023-07-13 DIAGNOSIS — G4733 Obstructive sleep apnea (adult) (pediatric): Secondary | ICD-10-CM | POA: Diagnosis not present

## 2023-07-13 DIAGNOSIS — I480 Paroxysmal atrial fibrillation: Secondary | ICD-10-CM | POA: Diagnosis not present

## 2023-07-13 DIAGNOSIS — Z7951 Long term (current) use of inhaled steroids: Secondary | ICD-10-CM | POA: Diagnosis not present

## 2023-07-13 DIAGNOSIS — N179 Acute kidney failure, unspecified: Secondary | ICD-10-CM | POA: Diagnosis not present

## 2023-07-13 DIAGNOSIS — J449 Chronic obstructive pulmonary disease, unspecified: Secondary | ICD-10-CM | POA: Diagnosis not present

## 2023-07-13 DIAGNOSIS — Z6841 Body Mass Index (BMI) 40.0 and over, adult: Secondary | ICD-10-CM | POA: Diagnosis not present

## 2023-07-13 DIAGNOSIS — E1122 Type 2 diabetes mellitus with diabetic chronic kidney disease: Secondary | ICD-10-CM | POA: Diagnosis not present

## 2023-07-13 DIAGNOSIS — M47814 Spondylosis without myelopathy or radiculopathy, thoracic region: Secondary | ICD-10-CM | POA: Diagnosis not present

## 2023-07-13 DIAGNOSIS — Z794 Long term (current) use of insulin: Secondary | ICD-10-CM | POA: Diagnosis not present

## 2023-07-13 DIAGNOSIS — I130 Hypertensive heart and chronic kidney disease with heart failure and stage 1 through stage 4 chronic kidney disease, or unspecified chronic kidney disease: Secondary | ICD-10-CM | POA: Diagnosis not present

## 2023-07-13 DIAGNOSIS — Z9981 Dependence on supplemental oxygen: Secondary | ICD-10-CM | POA: Diagnosis not present

## 2023-07-13 DIAGNOSIS — D649 Anemia, unspecified: Secondary | ICD-10-CM | POA: Diagnosis not present

## 2023-07-13 DIAGNOSIS — A419 Sepsis, unspecified organism: Principal | ICD-10-CM

## 2023-07-13 DIAGNOSIS — E1169 Type 2 diabetes mellitus with other specified complication: Secondary | ICD-10-CM | POA: Diagnosis not present

## 2023-07-13 DIAGNOSIS — Z79890 Hormone replacement therapy (postmenopausal): Secondary | ICD-10-CM | POA: Diagnosis not present

## 2023-07-13 DIAGNOSIS — M4644 Discitis, unspecified, thoracic region: Secondary | ICD-10-CM | POA: Diagnosis not present

## 2023-07-13 DIAGNOSIS — E66813 Obesity, class 3: Secondary | ICD-10-CM | POA: Diagnosis not present

## 2023-07-13 DIAGNOSIS — I5032 Chronic diastolic (congestive) heart failure: Secondary | ICD-10-CM | POA: Diagnosis not present

## 2023-07-13 DIAGNOSIS — M4624 Osteomyelitis of vertebra, thoracic region: Secondary | ICD-10-CM | POA: Diagnosis not present

## 2023-07-13 DIAGNOSIS — R5382 Chronic fatigue, unspecified: Secondary | ICD-10-CM | POA: Diagnosis not present

## 2023-07-13 DIAGNOSIS — E876 Hypokalemia: Secondary | ICD-10-CM | POA: Diagnosis not present

## 2023-07-13 DIAGNOSIS — E872 Acidosis, unspecified: Secondary | ICD-10-CM | POA: Diagnosis not present

## 2023-07-13 DIAGNOSIS — N184 Chronic kidney disease, stage 4 (severe): Secondary | ICD-10-CM | POA: Diagnosis not present

## 2023-07-13 LAB — URINALYSIS, ROUTINE W REFLEX MICROSCOPIC
Bilirubin Urine: NEGATIVE
Glucose, UA: 50 mg/dL — AB
Hgb urine dipstick: NEGATIVE
Ketones, ur: NEGATIVE mg/dL
Leukocytes,Ua: NEGATIVE
Nitrite: NEGATIVE
Protein, ur: NEGATIVE mg/dL
Specific Gravity, Urine: 1.008 (ref 1.005–1.030)
pH: 7 (ref 5.0–8.0)

## 2023-07-13 LAB — CBC
HCT: 33.5 % — ABNORMAL LOW (ref 39.0–52.0)
Hemoglobin: 10.9 g/dL — ABNORMAL LOW (ref 13.0–17.0)
MCH: 30.2 pg (ref 26.0–34.0)
MCHC: 32.5 g/dL (ref 30.0–36.0)
MCV: 92.8 fL (ref 80.0–100.0)
Platelets: 282 10*3/uL (ref 150–400)
RBC: 3.61 MIL/uL — ABNORMAL LOW (ref 4.22–5.81)
RDW: 15.4 % (ref 11.5–15.5)
WBC: 15.8 10*3/uL — ABNORMAL HIGH (ref 4.0–10.5)
nRBC: 0 % (ref 0.0–0.2)

## 2023-07-13 LAB — GLUCOSE, CAPILLARY
Glucose-Capillary: 283 mg/dL — ABNORMAL HIGH (ref 70–99)
Glucose-Capillary: 308 mg/dL — ABNORMAL HIGH (ref 70–99)

## 2023-07-13 LAB — CBG MONITORING, ED
Glucose-Capillary: 301 mg/dL — ABNORMAL HIGH (ref 70–99)
Glucose-Capillary: 273 mg/dL — ABNORMAL HIGH (ref 70–99)

## 2023-07-13 LAB — CREATININE, SERUM
Creatinine, Ser: 3.45 mg/dL — ABNORMAL HIGH (ref 0.61–1.24)
GFR, Estimated: 18 mL/min — ABNORMAL LOW (ref >60)

## 2023-07-13 LAB — LACTIC ACID, PLASMA
Lactic Acid, Venous: 3.3 mmol/L (ref 0.5–1.9)
Lactic Acid, Venous: 3.2 mmol/L (ref 0.5–1.9)

## 2023-07-13 LAB — SEDIMENTATION RATE: Sed Rate: 86 mm/h — ABNORMAL HIGH (ref 0–20)

## 2023-07-13 LAB — PROCALCITONIN: Procalcitonin: 0.4 ng/mL

## 2023-07-13 MED ORDER — NITROGLYCERIN 0.4 MG SL SUBL: 0.4000 mg | SUBLINGUAL_TABLET | SUBLINGUAL | Status: DC | PRN

## 2023-07-13 MED ORDER — LEVOTHYROXINE SODIUM 112 MCG PO TABS
112.0000 ug | ORAL_TABLET | Freq: Every day | ORAL | Status: DC
  Administered 2023-07-13 – 2023-07-15 (×3): 112 ug via ORAL
  Filled 2023-07-13 (×3): qty 1

## 2023-07-13 MED ORDER — ONDANSETRON HCL 4 MG/2ML IJ SOLN: 4.0000 mg | Freq: Four times a day (QID) | INTRAMUSCULAR | Status: DC | PRN

## 2023-07-13 MED ORDER — MOMETASONE FURO-FORMOTEROL FUM 200-5 MCG/ACT IN AERO
2.0000 | INHALATION_SPRAY | Freq: Two times a day (BID) | RESPIRATORY_TRACT | Status: DC
  Administered 2023-07-13 – 2023-07-15 (×5): 2 via RESPIRATORY_TRACT
  Filled 2023-07-13: qty 8.8

## 2023-07-13 MED ORDER — LACTATED RINGERS IV SOLN: INTRAVENOUS | Status: AC

## 2023-07-13 MED ORDER — ENOXAPARIN SODIUM 30 MG/0.3ML IJ SOSY
30.0000 mg | PREFILLED_SYRINGE | INTRAMUSCULAR | Status: DC
  Administered 2023-07-14: 30 mg via SUBCUTANEOUS
  Filled 2023-07-13: qty 0.3

## 2023-07-13 MED ORDER — ALBUTEROL SULFATE (2.5 MG/3ML) 0.083% IN NEBU: 2.5000 mg | INHALATION_SOLUTION | Freq: Four times a day (QID) | RESPIRATORY_TRACT | Status: DC | PRN

## 2023-07-13 MED ORDER — ONDANSETRON HCL 4 MG PO TABS: 4.0000 mg | ORAL_TABLET | Freq: Four times a day (QID) | ORAL | Status: DC | PRN

## 2023-07-13 MED ORDER — VANCOMYCIN HCL 1250 MG/250ML IV SOLN: 1250.0000 mg | INTRAVENOUS | Status: DC

## 2023-07-13 MED ORDER — LACTATED RINGERS IV BOLUS
1000.0000 mL | Freq: Once | INTRAVENOUS | Status: AC
  Administered 2023-07-13: 1000 mL via INTRAVENOUS

## 2023-07-13 MED ORDER — LACTATED RINGERS IV SOLN
150.0000 mL/h | INTRAVENOUS | Status: DC
Start: 2023-07-13 — End: 2023-07-13
  Administered 2023-07-13: 150 mL/h via INTRAVENOUS

## 2023-07-13 MED ORDER — INSULIN GLARGINE-YFGN 100 UNIT/ML ~~LOC~~ SOLN
50.0000 [IU] | Freq: Every day | SUBCUTANEOUS | Status: DC
  Administered 2023-07-13: 50 [IU] via SUBCUTANEOUS
  Filled 2023-07-13 (×2): qty 0.5

## 2023-07-13 MED ORDER — VANCOMYCIN HCL 500 MG/100ML IV SOLN
500.0000 mg | Freq: Once | INTRAVENOUS | Status: AC
  Administered 2023-07-13: 500 mg via INTRAVENOUS
  Filled 2023-07-13: qty 100

## 2023-07-13 MED ORDER — OXYCODONE HCL 5 MG PO TABS
5.0000 mg | ORAL_TABLET | Freq: Four times a day (QID) | ORAL | Status: DC | PRN
  Administered 2023-07-13 – 2023-07-14 (×2): 5 mg via ORAL
  Filled 2023-07-13 (×2): qty 1

## 2023-07-13 MED ORDER — SODIUM CHLORIDE 0.9 % IV SOLN: 2.0000 g | INTRAVENOUS | Status: DC

## 2023-07-13 MED ORDER — HYDRALAZINE HCL 20 MG/ML IJ SOLN: 10.0000 mg | INTRAMUSCULAR | Status: DC | PRN

## 2023-07-13 MED ORDER — TRAZODONE HCL 50 MG PO TABS
50.0000 mg | ORAL_TABLET | Freq: Every evening | ORAL | Status: DC | PRN
  Administered 2023-07-15: 50 mg via ORAL
  Filled 2023-07-13: qty 1

## 2023-07-13 MED ORDER — ENOXAPARIN SODIUM 80 MG/0.8ML IJ SOSY
0.5000 mg/kg | PREFILLED_SYRINGE | INTRAMUSCULAR | Status: DC
  Administered 2023-07-13: 67.5 mg via SUBCUTANEOUS
  Filled 2023-07-13: qty 0.68

## 2023-07-13 MED ORDER — GUAIFENESIN-DM 100-10 MG/5ML PO SYRP
15.0000 mL | ORAL_SOLUTION | ORAL | Status: DC | PRN
  Administered 2023-07-13 – 2023-07-15 (×11): 15 mL via ORAL
  Filled 2023-07-13 (×11): qty 20

## 2023-07-13 MED ORDER — VANCOMYCIN HCL 2000 MG/400ML IV SOLN
2000.0000 mg | Freq: Once | INTRAVENOUS | Status: AC
  Administered 2023-07-13: 2000 mg via INTRAVENOUS
  Filled 2023-07-13: qty 400

## 2023-07-13 MED ORDER — SENNOSIDES-DOCUSATE SODIUM 8.6-50 MG PO TABS
1.0000 | ORAL_TABLET | Freq: Every evening | ORAL | Status: DC | PRN
Start: 2023-07-13 — End: 2023-07-13

## 2023-07-13 MED ORDER — ALLOPURINOL 100 MG PO TABS
100.0000 mg | ORAL_TABLET | Freq: Every day | ORAL | Status: DC
  Administered 2023-07-13 – 2023-07-15 (×3): 100 mg via ORAL
  Filled 2023-07-13 (×4): qty 1

## 2023-07-13 MED ORDER — GADOBUTROL 1 MMOL/ML IV SOLN
10.0000 mL | Freq: Once | INTRAVENOUS | Status: AC | PRN
  Administered 2023-07-13: 10 mL via INTRAVENOUS

## 2023-07-13 MED ORDER — LACTATED RINGERS IV SOLN: INTRAVENOUS | Status: DC

## 2023-07-13 MED ORDER — LORATADINE 10 MG PO TABS
10.0000 mg | ORAL_TABLET | Freq: Every day | ORAL | Status: DC | PRN
  Administered 2023-07-13 – 2023-07-14 (×2): 10 mg via ORAL
  Filled 2023-07-13 (×2): qty 1

## 2023-07-13 MED ORDER — VANCOMYCIN HCL 2000 MG/400ML IV SOLN: 2000.0000 mg | Freq: Once | INTRAVENOUS | Status: DC

## 2023-07-13 MED ORDER — ASPIRIN 81 MG PO TBEC
81.0000 mg | DELAYED_RELEASE_TABLET | Freq: Every day | ORAL | Status: DC
  Administered 2023-07-13 – 2023-07-15 (×3): 81 mg via ORAL
  Filled 2023-07-13 (×3): qty 1

## 2023-07-13 MED ORDER — LORAZEPAM 1 MG PO TABS
1.0000 mg | ORAL_TABLET | Freq: Once | ORAL | Status: AC
  Administered 2023-07-13: 1 mg via ORAL
  Filled 2023-07-13: qty 1

## 2023-07-13 MED ORDER — SPIRONOLACTONE 25 MG PO TABS
25.0000 mg | ORAL_TABLET | Freq: Every day | ORAL | Status: DC
  Administered 2023-07-13 – 2023-07-15 (×3): 25 mg via ORAL
  Filled 2023-07-13 (×3): qty 1

## 2023-07-13 MED ORDER — POLYETHYLENE GLYCOL 3350 17 G PO PACK: 17.0000 g | PACK | Freq: Every day | ORAL | Status: DC | PRN

## 2023-07-13 MED ORDER — METOPROLOL TARTRATE 5 MG/5ML IV SOLN: 5.0000 mg | INTRAVENOUS | Status: DC | PRN

## 2023-07-13 MED ORDER — INSULIN ASPART 100 UNIT/ML IJ SOLN
0.0000 [IU] | Freq: Three times a day (TID) | INTRAMUSCULAR | Status: DC
  Administered 2023-07-13 – 2023-07-14 (×3): 8 [IU] via SUBCUTANEOUS
  Administered 2023-07-14 (×2): 5 [IU] via SUBCUTANEOUS
  Administered 2023-07-15: 8 [IU] via SUBCUTANEOUS
  Filled 2023-07-13 (×6): qty 1

## 2023-07-13 MED ORDER — SODIUM CHLORIDE 0.9 % IV SOLN
2.0000 g | Freq: Once | INTRAVENOUS | Status: AC
  Administered 2023-07-13: 2 g via INTRAVENOUS
  Filled 2023-07-13: qty 12.5

## 2023-07-13 MED ORDER — ISOSORBIDE MONONITRATE ER 30 MG PO TB24
30.0000 mg | ORAL_TABLET | Freq: Every day | ORAL | Status: DC
  Administered 2023-07-13 – 2023-07-15 (×3): 30 mg via ORAL
  Filled 2023-07-13 (×3): qty 1

## 2023-07-13 MED ORDER — AMIODARONE HCL 200 MG PO TABS
200.0000 mg | ORAL_TABLET | Freq: Every day | ORAL | Status: DC
  Administered 2023-07-13 – 2023-07-15 (×3): 200 mg via ORAL
  Filled 2023-07-13 (×3): qty 1

## 2023-07-13 MED ORDER — POTASSIUM CHLORIDE CRYS ER 20 MEQ PO TBCR
40.0000 meq | EXTENDED_RELEASE_TABLET | Freq: Once | ORAL | Status: AC
  Administered 2023-07-13: 40 meq via ORAL
  Filled 2023-07-13: qty 2

## 2023-07-13 MED ORDER — TORSEMIDE 20 MG PO TABS: 80.0000 mg | ORAL_TABLET | Freq: Every day | ORAL | Status: DC

## 2023-07-13 NOTE — Progress Notes (Signed)
PROGRESS NOTE    Jason Hudson  SWF:[id number] DOB: Jul 12, 1951 DOA: 07/13/2023 PCP: Enid Baas, MD     Brief Narrative:  72 year old with history of COPD on 3 L nasal cannula, paroxysmal A-fib not on anticoagulation due to GI bleed, DM2, HTN, CKD stage IV, CHF, OSA, staph bacteremia and T5-T6 discitis, osteomyelitis diagnosed in August 2024 and readmitted for sepsis again in October due to AKI from daptomycin use.  Eventually completed 38 days of IV daptomycin and Rocephin followed by 30 days of cefadroxil and doxycycline and started doing better.  With follow-up visit with PCP on 11/18 showed worsening leukocytosis and inflammatory markers therefore advised him to come to the ED.  MRI in the ER showed worsening osteomyelitis/discitis of T5-T6.  Started on IV antibiotics, will consult ID.   Assessment & Plan:  Principal Problem:   Discitis of thoracic region    Recurrent T5-T6 discitis and osteomyelitis, POA. Possible sepsis MRI shows progression of thoracic discitis/osteomyelitis.  I discussed case with neuroradiology.  Recommending monitoring patient off antibiotics at this time.  Trend CRP.   Chronic congestive heart failure with preserved EF (HCC) Euvolemic continue home GDMT  Hypokalemia - As needed repletion  Elevated anion gap - Likely from lactic acidosis.  Getting gentle hydration.  Repeat lab work.   Essential hypertension On Imdur, Aldactone, and torsemide While getting gentle IV fluids   Paroxysmal atrial fibrillation (HCC) Continue amiodarone Not on anticoagulation due to history of GI bleed   Chronic kidney disease, stage 4 (severe) (HCC) At baseline creatinine 3.5   Chronic obstructive pulmonary disease (HCC) Bronchodilators   Hyperglycemia and type 2 diabetes mellitus with  with long-term current use of insulin (HCC) Continue basal insulin.  Will add sliding scale   Obesity, Class III, BMI 40-49.9 (morbid obesity) (HCC) Complicating  factor to overall prognosis and care   Obstructive sleep apnea of adult Recommend CPAP   PT/OT   DVT prophylaxis: Lovenox Code Status: Full code Family Communication:   Status is: Inpatient Remains inpatient appropriate because: Ongoing evaluation for thoracic osteomyelitis, recurrent    Subjective:  Doing well no complaints. Somewhat feels unsteady.  Examination:  General exam: Appears calm and comfortable  Respiratory system: Clear to auscultation. Respiratory effort normal. Cardiovascular system: S1 & S2 heard, RRR. No JVD, murmurs, rubs, gallops or clicks. No pedal edema. Gastrointestinal system: Abdomen is nondistended, soft and nontender. No organomegaly or masses felt. Normal bowel sounds heard. Central nervous system: Alert and oriented. No focal neurological deficits. Extremities: Symmetric 5 x 5 power. Skin: No rashes, lesions or ulcers Psychiatry: Judgement and insight appear normal. Mood & affect appropriate.             Diet Orders (From admission, onward)     Start     Ordered   07/13/23 0413  Diet heart healthy/carb modified Room service appropriate? Yes; Fluid consistency: Thin  Diet effective now       Question Answer Comment  Diet-HS Snack? Nothing   Room service appropriate? Yes   Fluid consistency: Thin      07/13/23 0413            Objective: Vitals:   07/13/23 0630 07/13/23 0842 07/13/23 1000 07/13/23 1258  BP: 131/77  125/66   Pulse: 80  76   Resp: 19  18   Temp:  97.8 F (36.6 C)  97.6 F (36.4 C)  TempSrc:  Oral  Oral  SpO2: 97%  97%   Weight:  Height:        Intake/Output Summary (Last 24 hours) at 07/13/2023 1340 Last data filed at 07/13/2023 1610 Gross per 24 hour  Intake --  Output 400 ml  Net -400 ml   Filed Weights   07/12/23 1759 07/12/23 1803  Weight: (!) 137 kg (!) 137 kg    Scheduled Meds:  amiodarone  200 mg Oral Daily   [START ON 07/14/2023] enoxaparin (LOVENOX) injection  30 mg Subcutaneous  Q24H   insulin aspart  0-15 Units Subcutaneous TID WC   insulin glargine-yfgn  50 Units Subcutaneous Daily   isosorbide mononitrate  30 mg Oral Daily   levothyroxine  112 mcg Oral Q0600   mometasone-formoterol  2 puff Inhalation BID   spironolactone  25 mg Oral Daily   Continuous Infusions:  lactated ringers 40 mL/hr at 07/13/23 1023    Nutritional status     Body mass index is 45.92 kg/m.  Data Reviewed:   CBC: Recent Labs  Lab 07/12/23 1805 07/13/23 0420  WBC 16.0* 15.8*  HGB 12.2* 10.9*  HCT 36.7* 33.5*  MCV 92.9 92.8  PLT 292 282   Basic Metabolic Panel: Recent Labs  Lab 07/12/23 1805 07/13/23 0420  NA 129*  --   K 3.0*  --   CL 88*  --   CO2 25  --   GLUCOSE 328*  --   BUN 85*  --   CREATININE 3.66* 3.45*  CALCIUM 10.1  --    GFR: Estimated Creatinine Clearance: 27 mL/min (A) (by C-G formula based on SCr of 3.45 mg/dL (H)). Liver Function Tests: No results for input(s): "AST", "ALT", "ALKPHOS", "BILITOT", "PROT", "ALBUMIN" in the last 168 hours. No results for input(s): "LIPASE", "AMYLASE" in the last 168 hours. No results for input(s): "AMMONIA" in the last 168 hours. Coagulation Profile: No results for input(s): "INR", "PROTIME" in the last 168 hours. Cardiac Enzymes: No results for input(s): "CKTOTAL", "CKMB", "CKMBINDEX", "TROPONINI" in the last 168 hours. BNP (last 3 results) No results for input(s): "PROBNP" in the last 8760 hours. HbA1C: No results for input(s): "HGBA1C" in the last 72 hours. CBG: Recent Labs  Lab 07/13/23 0945 07/13/23 1139  GLUCAP 301* 273*   Lipid Profile: No results for input(s): "CHOL", "HDL", "LDLCALC", "TRIG", "CHOLHDL", "LDLDIRECT" in the last 72 hours. Thyroid Function Tests: No results for input(s): "TSH", "T4TOTAL", "FREET4", "T3FREE", "THYROIDAB" in the last 72 hours. Anemia Panel: No results for input(s): "VITAMINB12", "FOLATE", "FERRITIN", "TIBC", "IRON", "RETICCTPCT" in the last 72 hours. Sepsis  Labs: Recent Labs  Lab 07/13/23 0149 07/13/23 0343  PROCALCITON 0.40  --   LATICACIDVEN 3.3* 3.2*    Recent Results (from the past 240 hour(s))  Blood culture (routine x 2)     Status: None (Preliminary result)   Collection Time: 07/13/23  1:49 AM   Specimen: BLOOD  Result Value Ref Range Status   Specimen Description BLOOD BLOOD LEFT ARM  Final   Special Requests   Final    BOTTLES DRAWN AEROBIC AND ANAEROBIC Blood Culture adequate volume   Culture   Final    NO GROWTH < 12 HOURS Performed at [facility], [address] [place], [place] [address]    Report Status PENDING  Incomplete  Blood culture (routine x 2)     Status: None (Preliminary result)   Collection Time: 07/13/23  1:50 AM   Specimen: BLOOD  Result Value Ref Range Status   Specimen Description BLOOD BLOOD RIGHT ARM  Final  Special Requests   Final    BOTTLES DRAWN AEROBIC AND ANAEROBIC Blood Culture adequate volume   Culture   Final    NO GROWTH < 12 HOURS Performed at [facility], [address] [place], [place] [address]    Report Status PENDING  Incomplete         Radiology Studies: MR THORACIC SPINE W WO CONTRAST  Result Date: 07/13/2023 CLINICAL DATA:  Increasing inflammatory markers, concern for recurrence of osteomyelitis discitis EXAM: MRI THORACIC WITHOUT AND WITH CONTRAST TECHNIQUE: Multiplanar and multiecho pulse sequences of the thoracic spine were obtained without and with intravenous contrast. CONTRAST:  10mL GADAVIST GADOBUTROL 1 MMOL/ML IV SOLN COMPARISON:  04/19/2023 FINDINGS: Alignment: Straightening of the normal thoracic kyphosis. No significant listhesis in the thoracic spine. 4 mm retrolisthesis of T12 on L1. Vertebrae: Abnormal signal in the T5 and T6 vertebral bodies, with increased T2 and contrast enhancement signal throughout the vertebral bodies and in the disc space, which appears worse than on the prior exam, with progressive endplate erosions. Edema and  enhancement are also noted in bilateral T5-T6 and T6-T7 facets (series 21, images 11 and 17 and series 27, images 11 and 18). Enhancing material at the right-greater-than-left lateral aspect of T5 and T6 (series 25, images 18-21), concerning for phlegmon. A small amount of enhancing material is also noted at the ventral aspect of the thecal sac at T5-T7 (series 27, image 13 and 16 and series 25, images 19-21), concerning for epidural phlegmon, which does not cause significant spinal canal stenosis. No evidence of acute fracture or suspicious osseous lesion. Chronic endplate degenerative changes at T8-T9. Cord: Normal signal and morphology. No abnormal spinal cord enhancement. Paraspinal and other soft tissues: No additional acute finding. Disc levels: No high-grade spinal canal stenosis or neural foraminal narrowing. IMPRESSION: 1. Findings concerning for worsening osteomyelitis-discitis at T5-T6, with progressive endplate erosions and edema and enhancement in bilateral T5-T6 and T6-T7 facets, concerning for septic arthritis. 2. Enhancing material at the right-greater-than-left lateral aspect of T5 and T6, concerning for phlegmon. A small amount of enhancing material is also noted at the ventral aspect of the thecal sac at T5-T7, concerning for epidural phlegmon, which does not cause significant spinal canal stenosis. Electronically Signed   By: Wiliam Ke M.D.   On: 07/13/2023 03:22   DG Chest 2 View  Result Date: 07/12/2023 CLINICAL DATA:  Shortness of breath, COPD EXAM: CHEST - 2 VIEW COMPARISON:  04/29/2023 FINDINGS: Chronic elevation of the right hemidiaphragm. Right basilar opacity, likely atelectasis or scarring. Left lung clear. Heart is mildly enlarged. Mediastinal contours within normal limits. No acute bony abnormality. IMPRESSION: Cardiomegaly.  No overt edema. Right base atelectasis or scarring. Electronically Signed   By: Charlett Nose M.D.   On: 07/12/2023 21:01           LOS: 0 days    Time spent= 35 mins    Miguel Rota, MD [facility]  If 7PM-7AM, please contact night-coverage  07/13/2023, 1:40 PM

## 2023-07-13 NOTE — H&P (Signed)
History and Physical    [name] ZOX:[id number] DOB: 02/25/1951 DOA: 07/13/2023 DOS: the patient was seen and examined on 07/13/2023 PCP: Enid Baas, MD  Patient coming from: Home  Chief Complaint:  Chief Complaint  Patient presents with   Abnormal Labs    HPI: Jason Hudson is a 72 y.o. male with medical history significant for staph lugdunensis bacteremia and T5-T6 discitis and osteomyelitis initially diagnosed 04/12/2023 s/p treatment being readmitted with sepsis secondary to recurrent infection.  patient was last seen by ID on 06/26/2023 and was discharged.He subsequently had recommended follow-up labs done by his PCP on 11/18 that showed WBC 23,000, ESR 84 and CRP 26, and he was advised to return to the ED. He complains of generalized malaise and weakness for the past week as well as urinary urgency.  He denies back pain. Patient has an extensive past medical history significant for COPD on home O2 at 2 to 3 L, paroxysmal atrial fibrillation not on anticoagulation due to history of GI bleed,  OSA, HTN, DM, CKD 4, HFrEF ED course and data review: Vitals within normal limits. Labs significant for WBC 16,000, hemoglobin 12 , sodium 129 and potassium 3, glucose 328, creatinine 3.66 which is his baseline with normal bicarb and anion gap of 16 Troponin 34 and BNP 46 ESR and CRP pending Urinalysis unremarkable EKG, personally viewed and interpreted showing sinus at 85 with no acute ST-T wave changes MRI thoracic spine concerning for worsening osteomyelitis discitis at T5-T6 as further detailed below: Patient started on cefepime and vancomycin Hospitalist consulted for admission.    Review of Systems: As mentioned in the history of present illness. All other systems reviewed and are negative.  Past Medical History:  Diagnosis Date   CHF (congestive heart failure) (HCC)    COPD (chronic obstructive pulmonary disease) (HCC)    Diabetes mellitus  without complication (HCC)    Edema    leg swelling   Gastroesophageal reflux disease 12/15/2017   Gout    Hypertension    Melena 11/05/2017   Pneumonia 07/27/2016   Past Surgical History:  Procedure Laterality Date   CENTRAL LINE INSERTION-TUNNELED N/A 04/21/2023   Procedure: CENTRAL LINE INSERTION-TUNNELED;  Surgeon: Gilda Crease Latina Craver, MD;  Location: [facility] INVASIVE CV LAB;  Service: Cardiovascular;  Laterality: N/A;   COLONOSCOPY WITH PROPOFOL N/A 11/07/2017   Procedure: COLONOSCOPY WITH PROPOFOL;  Surgeon: Wyline Mood, MD;  Location: [facility] ENDOSCOPY;  Service: Gastroenterology;  Laterality: N/A;   DIALYSIS/PERMA CATHETER REMOVAL N/A 05/26/2023   Procedure: DIALYSIS/PERMA CATHETER REMOVAL;  Surgeon: Annice Needy, MD;  Location: [facility] INVASIVE CV LAB;  Service: Cardiovascular;  Laterality: N/A;   ESOPHAGOGASTRODUODENOSCOPY (EGD) WITH PROPOFOL N/A 11/07/2017   Procedure: ESOPHAGOGASTRODUODENOSCOPY (EGD) WITH PROPOFOL;  Surgeon: Wyline Mood, MD;  Location: [facility] ENDOSCOPY;  Service: Gastroenterology;  Laterality: N/A;   LEFT HEART CATH AND CORONARY ANGIOGRAPHY N/A 10/23/2017   Procedure: LEFT HEART CATH AND CORONARY ANGIOGRAPHY;  Surgeon: Marcina Millard, MD;  Location: [facility] INVASIVE CV LAB;  Service: Cardiovascular;  Laterality: N/A;   Social History:  reports that he has never smoked. He has never used smokeless tobacco. He reports that he does not currently use alcohol. He reports that he does not use drugs.  Allergies  Allergen Reactions   Atorvastatin Other (See Comments)    Other reaction(s): Not available Other reaction(s): Other (See Comments)    Red Dye #40 (Allura Red) Other (See Comments)    "not as allergic to it as I used  to be. I can take a little of it"    Family History  Problem Relation Age of Onset   Cancer Mother    CVA Father    Heart disease Father     Prior to Admission medications   Medication Sig Start Date End Date Taking? Authorizing Provider  albuterol  (VENTOLIN HFA) 108 (90 Base) MCG/ACT inhaler Inhale 1-2 puffs into the lungs every 6 (six) hours as needed for wheezing or shortness of breath. 01/03/23   Chesley Noon, MD  allopurinol (ZYLOPRIM) 100 MG tablet TAKE ONE TABLET (100 MG) BY MOUTH ONCE DAILY 09/09/22   Sallyanne Kuster, NP  amiodarone (PACERONE) 200 MG tablet Take 1 tablet (200 mg total) by mouth daily. 04/21/23 05/21/23  Darlin Drop, DO  aspirin EC 81 MG tablet Take 81 mg by mouth daily. Swallow whole.    [provider]  budesonide-formoterol (SYMBICORT) 160-4.5 MCG/ACT inhaler Inhale 2 puffs into the lungs 2 (two) times daily. 01/30/23 01/30/24  [provider]  colchicine 0.6 MG tablet Take 0.6 mg by mouth daily.    [provider]  diphenhydrAMINE (BENADRYL) 25 mg capsule Take 25 mg by mouth every 6 (six) hours as needed for itching or allergies.    [provider]  ipratropium (ATROVENT) 0.02 % nebulizer solution Take 2.5 mLs (0.5 mg total) by nebulization every 6 (six) hours as needed for wheezing or shortness of breath. 11/29/21   Sallyanne Kuster, NP  isosorbide mononitrate (IMDUR) 30 MG 24 hr tablet Take 30 mg by mouth daily. 08/13/21   [provider]  levothyroxine (SYNTHROID) 112 MCG tablet Take 112 mcg by mouth daily before breakfast. 09/25/20   [provider]  loratadine (CLARITIN) 10 MG tablet Take 10 mg by mouth daily.     [provider]  multivitamin (ONE-A-DAY MEN'S) TABS tablet Take 1 tablet by mouth daily.    [provider]  nitroGLYCERIN (NITROSTAT) 0.4 MG SL tablet Place 1 tablet (0.4 mg total) under the tongue every 5 (five) minutes as needed for chest pain. 10/23/17   Adrian Saran, MD  oxycodone (OXY-IR) 5 MG capsule Take 5 mg by mouth every 6 (six) hours as needed for pain.    [provider]  potassium chloride (KLOR-CON) 10 MEQ tablet TAKE TWO TABLETS EVERY OTHER DAY AND 1 TABLET ON THE DAYS IN BETWEEN Patient taking differently:  Take 20 mEq by mouth daily. 05/06/22   Sallyanne Kuster, NP  spironolactone (ALDACTONE) 25 MG tablet Take 25 mg by mouth daily.    [provider]  torsemide (DEMADEX) 20 MG tablet Take 80 mg by mouth daily.    [provider]  TRESIBA FLEXTOUCH 100 UNIT/ML FlexTouch Pen 120 Units daily. 05/19/23   [provider]    Physical Exam: Vitals:   07/13/23 0140 07/13/23 0156 07/13/23 0304 07/13/23 0330  BP: (!) 157/82  133/74 127/72  Pulse: 90  83 79  Resp: (!) 21  20 14   Temp:  97.9 F (36.6 C)    TempSrc:  Oral    SpO2: 93%  94% 97%  Weight:      Height:       Physical Exam Vitals and nursing note reviewed.  Constitutional:      General: He is not in acute distress.    Appearance: He is morbidly obese.  HENT:     Head: Normocephalic and atraumatic.  Cardiovascular:     Rate and Rhythm: Normal rate and regular rhythm.  Heart sounds: Normal heart sounds.  Pulmonary:     Effort: Pulmonary effort is normal.     Breath sounds: Normal breath sounds.  Abdominal:     Palpations: Abdomen is soft.     Tenderness: There is no abdominal tenderness.  Neurological:     Mental Status: Mental status is at baseline.     Labs on Admission: I have personally reviewed following labs and imaging studies  CBC: Recent Labs  Lab 07/12/23 1805  WBC 16.0*  HGB 12.2*  HCT 36.7*  MCV 92.9  PLT 292   Basic Metabolic Panel: Recent Labs  Lab 07/12/23 1805  NA 129*  K 3.0*  CL 88*  CO2 25  GLUCOSE 328*  BUN 85*  CREATININE 3.66*  CALCIUM 10.1   GFR: Estimated Creatinine Clearance: 25.4 mL/min (A) (by C-G formula based on SCr of 3.66 mg/dL (H)). Liver Function Tests: No results for input(s): "AST", "ALT", "ALKPHOS", "BILITOT", "PROT", "ALBUMIN" in the last 168 hours. No results for input(s): "LIPASE", "AMYLASE" in the last 168 hours. No results for input(s): "AMMONIA" in the last 168 hours. Coagulation Profile: No results for input(s): "INR", "PROTIME"  in the last 168 hours. Cardiac Enzymes: No results for input(s): "CKTOTAL", "CKMB", "CKMBINDEX", "TROPONINI" in the last 168 hours. BNP (last 3 results) No results for input(s): "PROBNP" in the last 8760 hours. HbA1C: No results for input(s): "HGBA1C" in the last 72 hours. CBG: No results for input(s): "GLUCAP" in the last 168 hours. Lipid Profile: No results for input(s): "CHOL", "HDL", "LDLCALC", "TRIG", "CHOLHDL", "LDLDIRECT" in the last 72 hours. Thyroid Function Tests: No results for input(s): "TSH", "T4TOTAL", "FREET4", "T3FREE", "THYROIDAB" in the last 72 hours. Anemia Panel: No results for input(s): "VITAMINB12", "FOLATE", "FERRITIN", "TIBC", "IRON", "RETICCTPCT" in the last 72 hours. Urine analysis:    Component Value Date/Time   COLORURINE STRAW (A) 07/13/2023 0149   APPEARANCEUR CLEAR (A) 07/13/2023 0149   APPEARANCEUR Clear 01/05/2022 1324   LABSPEC 1.008 07/13/2023 0149   PHURINE 7.0 07/13/2023 0149   GLUCOSEU 50 (A) 07/13/2023 0149   HGBUR NEGATIVE 07/13/2023 0149   BILIRUBINUR NEGATIVE 07/13/2023 0149   BILIRUBINUR Negative 01/05/2022 1324   KETONESUR NEGATIVE 07/13/2023 0149   PROTEINUR NEGATIVE 07/13/2023 0149   NITRITE NEGATIVE 07/13/2023 0149   LEUKOCYTESUR NEGATIVE 07/13/2023 0149    Radiological Exams on Admission: MR THORACIC SPINE W WO CONTRAST  Result Date: 07/13/2023 CLINICAL DATA:  Increasing inflammatory markers, concern for recurrence of osteomyelitis discitis EXAM: MRI THORACIC WITHOUT AND WITH CONTRAST TECHNIQUE: Multiplanar and multiecho pulse sequences of the thoracic spine were obtained without and with intravenous contrast. CONTRAST:  10mL GADAVIST GADOBUTROL 1 MMOL/ML IV SOLN COMPARISON:  04/19/2023 FINDINGS: Alignment: Straightening of the normal thoracic kyphosis. No significant listhesis in the thoracic spine. 4 mm retrolisthesis of T12 on L1. Vertebrae: Abnormal signal in the T5 and T6 vertebral bodies, with increased T2 and contrast  enhancement signal throughout the vertebral bodies and in the disc space, which appears worse than on the prior exam, with progressive endplate erosions. Edema and enhancement are also noted in bilateral T5-T6 and T6-T7 facets (series 21, images 11 and 17 and series 27, images 11 and 18). Enhancing material at the right-greater-than-left lateral aspect of T5 and T6 (series 25, images 18-21), concerning for phlegmon. A small amount of enhancing material is also noted at the ventral aspect of the thecal sac at T5-T7 (series 27, image 13 and 16 and series 25, images 19-21), concerning for epidural phlegmon, which does  not cause significant spinal canal stenosis. No evidence of acute fracture or suspicious osseous lesion. Chronic endplate degenerative changes at T8-T9. Cord: Normal signal and morphology. No abnormal spinal cord enhancement. Paraspinal and other soft tissues: No additional acute finding. Disc levels: No high-grade spinal canal stenosis or neural foraminal narrowing. IMPRESSION: 1. Findings concerning for worsening osteomyelitis-discitis at T5-T6, with progressive endplate erosions and edema and enhancement in bilateral T5-T6 and T6-T7 facets, concerning for septic arthritis. 2. Enhancing material at the right-greater-than-left lateral aspect of T5 and T6, concerning for phlegmon. A small amount of enhancing material is also noted at the ventral aspect of the thecal sac at T5-T7, concerning for epidural phlegmon, which does not cause significant spinal canal stenosis. Electronically Signed   By: Wiliam Ke M.D.   On: 07/13/2023 03:22   DG Chest 2 View  Result Date: 07/12/2023 CLINICAL DATA:  Shortness of breath, COPD EXAM: CHEST - 2 VIEW COMPARISON:  04/29/2023 FINDINGS: Chronic elevation of the right hemidiaphragm. Right basilar opacity, likely atelectasis or scarring. Left lung clear. Heart is mildly enlarged. Mediastinal contours within normal limits. No acute bony abnormality. IMPRESSION:  Cardiomegaly.  No overt edema. Right base atelectasis or scarring. Electronically Signed   By: Charlett Nose M.D.   On: 07/12/2023 21:01     Data Reviewed: Relevant notes from primary care and specialist visits, past discharge summaries as available in EHR, including Care Everywhere. Prior diagnostic testing as pertinent to current admission diagnoses Updated medications and problem lists for reconciliation ED course, including vitals, labs, imaging, treatment and response to treatment Triage notes, nursing and pharmacy notes and ED provider's notes Notable results as noted in HPI   Assessment and Plan:   Recurrent T5-T6 discitis and osteomyelitis, POA. Possible sepsis MRI shows progression. ID consult.  Can consider IR consult Patient normal vitals but has elevated WBC, lactic acid and procalcitonin Vancomycin and cefepime ID consult in the a.m.:   Chronic systolic congestive heart failure (HCC) Euvolemic continue home GDMT   Essential hypertension Vital signs are stable continue home regimen   Paroxysmal atrial fibrillation (HCC) Continue amiodarone Not on anticoagulation due to history of GI bleed   Chronic kidney disease, stage 4 (severe) (HCC) At baseline   Chronic obstructive pulmonary disease (HCC) Stable Resume home regimen   Hyperglycemia and type 2 diabetes mellitus with  with long-term current use of insulin (HCC) Sliding scale insulin coverage Basal insulin   Obesity, Class III, BMI 40-49.9 (morbid obesity) (HCC) Complicating factor to overall prognosis and care   Obstructive sleep apnea of adult Recommend CPAP    DVT prophylaxis: Lovenox  Consults: none  Advance Care Planning:   Code Status: Prior   Family Communication: none  Disposition Plan: Back to previous home environment  Severity of Illness: The appropriate patient status for this patient is INPATIENT. Inpatient status is judged to be reasonable and necessary in order to provide the  required intensity of service to ensure the patient's safety. The patient's presenting symptoms, physical exam findings, and initial radiographic and laboratory data in the context of their chronic comorbidities is felt to place them at high risk for further clinical deterioration. Furthermore, it is not anticipated that the patient will be medically stable for discharge from the hospital within 2 midnights of admission.   * I certify that at the point of admission it is my clinical judgment that the patient will require inpatient hospital care spanning beyond 2 midnights from the point of admission due to high intensity  of service, high risk for further deterioration and high frequency of surveillance required.*  Author: Andris Baumann, MD 07/13/2023 4:06 AM  For on call review [email or web address].

## 2023-07-13 NOTE — Consult Note (Signed)
[facility]    Date of Admission:  07/13/2023     Reason for Consult: history of vertebral om    Referring Provider: Stephania Fragmin     Lines:  Peripheral iv's  Abx: 11/21 vanc/cefepime 1 dose       Assessment: 72 yo male with obesity, ckd, dm2, hx staph lugdenensis bacteremia 04/14/23 with thoracic t5-7 vertebral om, s/p more than 2 months of treatment, chronic leukocytosis and elevated sed rate, referred from primary care office for admission in setting of continued elevation of wbc and sed rate, and with repeat mri this admission showing changes questionable for progression vs evolution of osteomyelitis  His last abx was 3-4 weeks prior to this admission  He has no spinous midline pain at this time  He recently developed dry cough for the past 2 weeks and his girlfriend/wife has "pneumonia"  I spoke with neuroradiologist and this, in the right clinical context, could just represent evolving changes that we would expect for vertebral osteomyelitis  His obesity, ckd, and anemia which would increase sed rate. His last 05/23/23 crp was normal and we can rely on that to trend and continue to monitor symptoms and history to see if there is any active vertebral osteomyelitis  Certainly other inflammatory process/infection (?viral uri) can also cause elevation of sed rate or inflammtory markers or lactate. We can also await bcx and see  Plan: F/u bcx Would hold abx for now Trend crp while he is here If no concerning changes would see in id clinic for repeat evaluation and determine further the nature of his mri tspine finding I have setup his id clinic visit on 12/12 @ 930 with dr Rivka Safer Tomorrow if bcx remains negative and he remains clinically stable could discharge from id standpoint Discussed with Dr Nelson Chimes primary team      ------------------------------------------------ Principal Problem:   Discitis of thoracic region    HPI:  Jason Hudson is a 72 y.o. male with ckd, dm2, hx staph lugdenensis bacteremia 04/14/23 with thoracic t5-7 vertebral om, s/p more than 2 months of treatment, chronic leukocytosis and elevated sed rate, referred from primary care office for admission in setting of continued elevation of wbc and sed rate, and with repeat mri this admission showing changes questionable for progression vs evolution of osteomyelitis  He has been following dr Rivka Safer for his staph lugdenensis bsi/t5-7 vertebral om dx'ed late 03/2023. He finished just beyond 2 months of abx and last dose was 3-4 weeks before this admission   His thoracic midline pain had resolved. He has had no fever, chill  He reports chronic fatigue that hasn't changed. He wears 2-3 liters o2 at home   The past 2-3 weeks he has had a dry cough. His girlfriend/wife has "pneumonia"  His appetite has been stable   He has routine f/u labs with his pcp and was sent to the ed for elevated wbc and sed rate. These appears also stable (chronic leukocytosis and only 3 highly elevated sed rate since 04/21/23  His crp early 05/2023 was normal. No repeat crp yet   Afebrile on presentation Normal hemodynamics Oxygen requirement 2 liters via [place] Wbc stable 16; no thrombocytoesis; hb 11-12 Sed rate 80s Lactate 3's Cr stable low to mid 3's; bun very elevated but also not higher than baseline Mri thoracic spine as below Patient got bcx cooking and a dose vanc/cefepime administered in ed   He really has  no complaint at this time besides the cough and the chronic fatigue    Family History  Problem Relation Age of Onset   Cancer Mother    CVA Father    Heart disease Father     Social History   Tobacco Use   Smoking status: Never   Smokeless tobacco: Never  Substance Use Topics   Alcohol use: Not Currently    Comment: ocassionally   Drug use: Never    Allergies  Allergen Reactions   Atorvastatin Other (See Comments)    Other  reaction(s): Not available Other reaction(s): Other (See Comments)    Red Dye #40 (Allura Red) Other (See Comments)    "not as allergic to it as I used to be. I can take a little of it"    Review of Systems: ROS All Other ROS was negative, except mentioned above   Past Medical History:  Diagnosis Date   CHF (congestive heart failure) (HCC)    COPD (chronic obstructive pulmonary disease) (HCC)    Diabetes mellitus without complication (HCC)    Edema    leg swelling   Gastroesophageal reflux disease 12/15/2017   Gout    Hypertension    Melena 11/05/2017   Pneumonia 07/27/2016       Scheduled Meds:  amiodarone  200 mg Oral Daily   [START ON 07/14/2023] enoxaparin (LOVENOX) injection  30 mg Subcutaneous Q24H   insulin aspart  0-15 Units Subcutaneous TID WC   insulin glargine-yfgn  50 Units Subcutaneous Daily   isosorbide mononitrate  30 mg Oral Daily   levothyroxine  112 mcg Oral Q0600   mometasone-formoterol  2 puff Inhalation BID   spironolactone  25 mg Oral Daily   Continuous Infusions:  lactated ringers 40 mL/hr at 07/13/23 1023   PRN Meds:.albuterol, guaiFENesin-dextromethorphan, hydrALAZINE, metoprolol tartrate, nitroGLYCERIN, ondansetron **OR** ondansetron (ZOFRAN) IV, oxyCODONE, polyethylene glycol, traZODone   OBJECTIVE: Blood pressure 125/66, pulse 76, temperature 97.8 F (36.6 C), temperature source Oral, resp. rate 18, height 5\' 8"  (1.727 m), weight (!) 137 kg, SpO2 97%.  Physical Exam  General/constitutional: no distress, pleasant; obese; on o2 via [place] at 2-3 liters HEENT: Normocephalic, PER, Conj Clear, EOMI, Oropharynx clear Neck supple CV: rrr no mrg Lungs: clear to auscultation, normal respiratory effort Abd: Soft, Nontender Ext: no edema Skin: No Rash Neuro: nonfocal MSK: no peripheral joint swelling/tenderness/warmth; back spines nontender    Lab Results Lab Results  Component Value Date   WBC 15.8 (H) 07/13/2023   HGB 10.9 (L) 07/13/2023    HCT 33.5 (L) 07/13/2023   MCV 92.8 07/13/2023   PLT 282 07/13/2023    Lab Results  Component Value Date   CREATININE 3.45 (H) 07/13/2023   BUN 85 (H) 07/12/2023   NA 129 (L) 07/12/2023   K 3.0 (L) 07/12/2023   CL 88 (L) 07/12/2023   CO2 25 07/12/2023    Lab Results  Component Value Date   ALT 76 (H) 05/25/2023   AST 112 (H) 05/25/2023   ALKPHOS 90 05/25/2023   BILITOT 0.4 05/25/2023      Microbiology: Recent Results (from the past 240 hour(s))  Blood culture (routine x 2)     Status: None (Preliminary result)   Collection Time: 07/13/23  1:49 AM   Specimen: BLOOD  Result Value Ref Range Status   Specimen Description BLOOD BLOOD LEFT ARM  Final   Special Requests   Final    BOTTLES DRAWN AEROBIC AND ANAEROBIC Blood Culture adequate volume  Culture   Final    NO GROWTH < 12 HOURS Performed at [facility], [address] [place], [place] [address]    Report Status PENDING  Incomplete  Blood culture (routine x 2)     Status: None (Preliminary result)   Collection Time: 07/13/23  1:50 AM   Specimen: BLOOD  Result Value Ref Range Status   Specimen Description BLOOD BLOOD RIGHT ARM  Final   Special Requests   Final    BOTTLES DRAWN AEROBIC AND ANAEROBIC Blood Culture adequate volume   Culture   Final    NO GROWTH < 12 HOURS Performed at [facility], [address], [place], [place] [address]    Report Status PENDING  Incomplete     Serology:    Imaging: If present, new imagings (plain films, ct scans, and mri) have been personally visualized and interpreted; radiology reports have been reviewed. Decision making incorporated into the Impression / Recommendations.  07/13/23 mri t spine with and without contrast IMPRESSION: 1. Findings concerning for worsening osteomyelitis-discitis at T5-T6, with progressive endplate erosions and edema and enhancement in bilateral T5-T6 and T6-T7 facets, concerning for septic arthritis. 2. Enhancing  material at the right-greater-than-left lateral aspect of T5 and T6, concerning for phlegmon. A small amount of enhancing material is also noted at the ventral aspect of the thecal sac at T5-T7, concerning for epidural phlegmon, which does not cause significant spinal canal stenosis.   I called radiology today 07/13/23 (spoke with neuroradiologist dr Mosetta Putt). While it is not apple to apple comparision (the 03/2023 mri was without contrast), besides the progressive erosive change, all other facet joint/bone marrow edema and phlegmon remains stable. There is no new periarticular or perivertebral inflammatory changes    Raymondo Band, MD [facility] [phone] pager    07/13/2023, 11:02 AM

## 2023-07-13 NOTE — Progress Notes (Signed)
Pharmacy Antibiotic Note  Jason Hudson is a 72 y.o. male admitted on 07/13/2023 with  osteomyelitis .  Pharmacy has been consulted for Vancomycin , Cefepime  dosing.  Plan: Cefepime 2 gm IV X 1 given in ED on 11/21 @ 0354. Cefepime 2 gm IV Q24H ordered to start on 11/22 @ 0400.  Vancomycin 2500 mg IV X 1 started on 11/21 @ 0428. Vancomycin 1250 mg IV Q48H ordered 11/23 @ 0400.  AUC = 468.4 Vanc trough = 12.1  Height: 5\' 8"  (172.7 cm) Weight: (!) 137 kg (302 lb) IBW/kg (Calculated) : 68.4  Temp (24hrs), Avg:98 F (36.7 C), Min:97.8 F (36.6 C), Max:98.5 F (36.9 C)  Recent Labs  Lab 07/12/23 1805 07/13/23 0149 07/13/23 0343 07/13/23 0420  WBC 16.0*  --   --  15.8*  CREATININE 3.66*  --   --   --   LATICACIDVEN  --  3.3* 3.2*  --     Estimated Creatinine Clearance: 25.4 mL/min (A) (by C-G formula based on SCr of 3.66 mg/dL (H)).    Allergies  Allergen Reactions   Atorvastatin Other (See Comments)    Other reaction(s): Not available Other reaction(s): Other (See Comments)    Red Dye #40 (Allura Red) Other (See Comments)    "not as allergic to it as I used to be. I can take a little of it"    Antimicrobials this admission:   >>    >>   Dose adjustments this admission:   Microbiology results:  BCx:   UCx:    Sputum:    MRSA PCR:   Thank you for allowing pharmacy to be a part of this patient's care.  Zack Crager D 07/13/2023 5:12 AM

## 2023-07-13 NOTE — ED Notes (Signed)
This tech assisted pt. With urinal and placed socks on feet per request. Pt placed back on monitoring.

## 2023-07-13 NOTE — Progress Notes (Signed)
Anticoagulation monitoring(Lovenox):  72 yo male ordered Lovenox 40 mg Q24h    Filed Weights   07/12/23 1759 07/12/23 1803  Weight: (!) 137 kg (302 lb) (!) 137 kg (302 lb)   BMI 45.9    Lab Results  Component Value Date   CREATININE 3.66 (H) 07/12/2023   CREATININE 3.45 (H) 05/26/2023   CREATININE 3.37 (H) 05/25/2023   Estimated Creatinine Clearance: 25.4 mL/min (A) (by C-G formula based on SCr of 3.66 mg/dL (H)). Hemoglobin & Hematocrit     Component Value Date/Time   HGB 12.2 (L) 07/12/2023 1805   HGB 11.6 (L) 10/21/2020 1102   HCT 36.7 (L) 07/12/2023 1805   HCT 35.9 (L) 10/21/2020 1102     Per Protocol for Patient with estCrcl > 30 ml/min and BMI > 30, will transition to Lovenox 67.5  mg Q24h.

## 2023-07-13 NOTE — Hospital Course (Addendum)
  Brief Narrative:  72 year old with history of COPD on 3 L nasal cannula, paroxysmal A-fib not on anticoagulation due to GI bleed, DM2, HTN, CKD stage IV, CHF, OSA, staph bacteremia and T5-T6 discitis, osteomyelitis diagnosed in August 2024 and readmitted for sepsis again in October due to AKI from daptomycin use.  Eventually completed 38 days of IV daptomycin and Rocephin followed by 30 days of cefadroxil and doxycycline and started doing better.  With follow-up visit with PCP on 11/18 showed worsening leukocytosis and inflammatory markers therefore advised him to come to the ED.  MRI in the ER showed worsening osteomyelitis/discitis of T5-T6.  Started on IV antibiotics, will consult ID. ID eventually monitoring off Abx and trending CRP.    Assessment & Plan:  Principal Problem:   Discitis of thoracic region    Recurrent T5-T6 discitis and osteomyelitis, POA. Possible sepsis MRI shows progression of thoracic discitis/osteomyelitis.  Dr Renold Don discussed case with neuroradiology.  Recommending monitoring patient off antibiotics at this time.  Trend CRP. Urine culture insignificant growth.  Patient is asymptomatic.   Chronic congestive heart failure with preserved EF (HCC) Euvolemic continue home GDMT  Hypokalemia - As needed repletion  Elevated anion gap - Likely from lactic acidosis.  Getting gentle hydration.  Repeat lab work.   Essential hypertension On Imdur, Aldactone, and will resume torsemide upon discharge   Paroxysmal atrial fibrillation (HCC) Continue amiodarone Not on anticoagulation due to history of GI bleed   Chronic kidney disease, stage 4 (severe) (HCC) At baseline creatinine 3.5   Chronic obstructive pulmonary disease (HCC) Bronchodilators   Hyperglycemia and type 2 diabetes mellitus with  with long-term current use of insulin (HCC) Basal, Premeal insulin.  Sliding scale.  Adjust as necessary   Obesity, Class III, BMI 40-49.9 (morbid obesity) (HCC) Complicating  factor to overall prognosis and care   Obstructive sleep apnea of adult Recommend CPAP   PT/OT-face-to-face done   DVT prophylaxis: Lovenox Code Status: Full code Family Communication:   Status is: Inpatient Remains inpatient appropriate because: Ongoing culture and inflammatory markers monitoring.  Hopefully home tomorrow  Subjective:  No new complaints  Examination:  General exam: Appears calm and comfortable  Respiratory system: Clear to auscultation. Respiratory effort normal. Cardiovascular system: S1 & S2 heard, RRR. No JVD, murmurs, rubs, gallops or clicks. No pedal edema. Gastrointestinal system: Abdomen is nondistended, soft and nontender. No organomegaly or masses felt. Normal bowel sounds heard. Central nervous system: Alert and oriented. No focal neurological deficits. Extremities: Symmetric 5 x 5 power. Skin: No rashes, lesions or ulcers Psychiatry: Judgement and insight appear normal. Mood & affect appropriate.

## 2023-07-13 NOTE — Progress Notes (Signed)
PHARMACIST - PHYSICIAN COMMUNICATION  CONCERNING:  Enoxaparin (Lovenox) for DVT Prophylaxis    RECOMMENDATION: Patient was currently on enoxaprin 67.5 mg q24 hours for VTE prophylaxis.   Filed Weights   07/12/23 1759 07/12/23 1803  Weight: (!) 137 kg (302 lb) (!) 137 kg (302 lb)    Body mass index is 45.92 kg/m.  Estimated Creatinine Clearance: 27 mL/min (A) (by C-G formula based on SCr of 3.45 mg/dL (H)).  Patient is candidate for enoxaparin 30mg  every 24 hours based on CrCl <66ml/min   DESCRIPTION: Pharmacy has adjusted enoxaparin dose per [facility] policy.  Patient is now receiving enoxaparin 30 mg every 24 hours    Merryl Hacker, PharmD Clinical Pharmacist  07/13/2023 10:11 AM

## 2023-07-13 NOTE — Progress Notes (Signed)
PHARMACY CONSULT NOTE - ELECTROLYTES  Pharmacy Consult for Electrolyte Monitoring and Replacement   Recent Labs: Height: 5\' 8"  (172.7 cm) Weight: (!) 137 kg (302 lb) IBW/kg (Calculated) : 68.4 Estimated Creatinine Clearance: 27 mL/min (A) (by C-G formula based on SCr of 3.45 mg/dL (H)). Potassium (mmol/L)  Date Value  07/12/2023 3.0 (L)   Magnesium (mg/dL)  Date Value  09/81/1914 1.6 (L)   Calcium (mg/dL)  Date Value  78/29/5621 10.1   Albumin (g/dL)  Date Value  30/86/5784 3.3 (L)  10/21/2020 3.8   Phosphorus (mg/dL)  Date Value  69/62/9528 4.6   Sodium (mmol/L)  Date Value  07/12/2023 129 (L)  10/21/2020 137   Corrected Ca: 10.7 mg/dL  Assessment  Jason Hudson is a 72 y.o. male presenting with sepsis secondary to recurrent infection. PMH significant for staph lugdunensis bacteremia and T5-T6 discitis and osteomyelitis initially diagnosed 04/12/2023 s/p treatment. Pharmacy has been consulted to monitor and replace electrolytes.  Diet: PO MIVF: LR @ 40 mL/hr Pertinent medications: spironolactone  Goal of Therapy: Electrolytes WNL  Plan:  Give KCl 40 mEq PO x 1 No recent Mg or Phos Check BMP, Mg, Phos with AM labs  Thank you for allowing pharmacy to be a part of this patient's care.  Merryl Hacker, PharmD Clinical Pharmacist 07/13/2023 9:53 AM

## 2023-07-13 NOTE — Progress Notes (Signed)
CODE SEPSIS - PHARMACY COMMUNICATION  **Broad Spectrum Antibiotics should be administered within 1 hour of Sepsis diagnosis**  Time Code Sepsis Called/Page Received: 11/21 @ 1610   Antibiotics Ordered: Cefepime , Vancomycin   Time of 1st antibiotic administration: Cefepime 2 gm IV X on 11/21 @ 0354.   Additional action taken by pharmacy:   If necessary, Name of Provider/Nurse Contacted:     Solan Vosler D ,PharmD Clinical Pharmacist  07/13/2023  4:29 AM

## 2023-07-13 NOTE — Plan of Care (Signed)
  Problem: Respiratory: Goal: Ability to maintain adequate ventilation will improve Outcome: Progressing   Problem: Skin Integrity: Goal: Risk for impaired skin integrity will decrease Outcome: Progressing   Problem: Education: Goal: Knowledge of General Education information will improve Description: Including pain rating scale, medication(s)/side effects and non-pharmacologic comfort measures Outcome: Progressing   Problem: Pain Management: Goal: General experience of comfort will improve Outcome: Progressing   Problem: Safety: Goal: Ability to remain free from injury will improve Outcome: Progressing   Problem: Skin Integrity: Goal: Risk for impaired skin integrity will decrease Outcome: Progressing

## 2023-07-13 NOTE — Sepsis Progress Note (Signed)
Elink monitoring for the code sepsis protocol.  Initial lactic & blood cultures x2 were collected 2 hrs prior to code sepsis order.  Antibiotics not given within one hour of blood culture collection.

## 2023-07-13 NOTE — ED Provider Notes (Signed)
[facility] Provider Note    Event Date/Time   First MD Initiated Contact with Patient 07/13/23 [id number]     (approximate)   History   Abnormal Labs   HPI  Jason Hudson is a 72 y.o. male who presents to the ED for evaluation of Abnormal Labs   I reviewed medical DC summary from 10/4.  Admitted for bacteremia in the setting of staph lugdunensis with T5/6 discitis/osteomyelitis.  30 days of doxycycline and cefadroxil, this was after an initial hospitalization in August with IV daptomycin and ceftriaxone for 6 weeks. Otherwise, morbidly obese with CKD, paroxysmal A-fib. Also review ID follow-up from 11/5.  PCP follow-up in 11/18 after all of this.  They sent some screening outpatient labs that I review with a leukocytosis of 23,000, elevated ESR to 84, CRP of 26  Patient presents to the ED due to these abnormal laboratory values.  He does report about 1 week of malaise and generalized weakness.  Does report urinary urgency without dysuria.   Physical Exam   Triage Vital Signs: ED Triage Vitals  Encounter Vitals Group     BP 07/12/23 1758 (!) 141/73     Systolic BP Percentile --      Diastolic BP Percentile --      Pulse Rate 07/12/23 1758 84     Resp 07/12/23 1758 18     Temp 07/12/23 1758 98.5 F (36.9 C)     Temp Source 07/12/23 1758 Oral     SpO2 07/12/23 1758 92 %     Weight 07/12/23 1759 (!) 302 lb (137 kg)     Height 07/12/23 1759 5\' 8"  (1.727 m)     Head Circumference --      Peak Flow --      Pain Score 07/12/23 1803 0     Pain Loc --      Pain Education --      Exclude from Growth Chart --     Most recent vital signs: Vitals:   07/13/23 0304 07/13/23 0330  BP: 133/74 127/72  Pulse: 83 79  Resp: 20 14  Temp:    SpO2: 94% 97%    General: Awake, no distress.  Morbidly obese CV:  Good peripheral perfusion.  Resp:  Normal effort.  Abd:  No distention.  Benign MSK:  No deformity noted.  Chronic venous stasis dermatitis  without asymmetry or superimposed infectious features. Neuro:  No focal deficits appreciated. Other:     ED Results / Procedures / Treatments   Labs (all labs ordered are listed, but only abnormal results are displayed) Labs Reviewed  BASIC METABOLIC PANEL - Abnormal; Notable for the following components:      Result Value   Sodium 129 (*)    Potassium 3.0 (*)    Chloride 88 (*)    Glucose, Bld 328 (*)    BUN 85 (*)    Creatinine, Ser 3.66 (*)    GFR, Estimated 17 (*)    Anion gap 16 (*)    All other components within normal limits  CBC - Abnormal; Notable for the following components:   WBC 16.0 (*)    RBC 3.95 (*)    Hemoglobin 12.2 (*)    HCT 36.7 (*)    All other components within normal limits  URINALYSIS, ROUTINE W REFLEX MICROSCOPIC - Abnormal; Notable for the following components:   Color, Urine STRAW (*)    APPearance CLEAR (*)    Glucose, UA 50 (*)  All other components within normal limits  LACTIC ACID, PLASMA - Abnormal; Notable for the following components:   Lactic Acid, Venous 3.3 (*)    All other components within normal limits  TROPONIN I (HIGH SENSITIVITY) - Abnormal; Notable for the following components:   Troponin I (High Sensitivity) 30 (*)    All other components within normal limits  TROPONIN I (HIGH SENSITIVITY) - Abnormal; Notable for the following components:   Troponin I (High Sensitivity) 34 (*)    All other components within normal limits  CULTURE, BLOOD (ROUTINE X 2)  CULTURE, BLOOD (ROUTINE X 2)  URINE CULTURE  BRAIN NATRIURETIC PEPTIDE  PROCALCITONIN  LACTIC ACID, PLASMA  SEDIMENTATION RATE  C-REACTIVE PROTEIN    EKG Sinus rhythm with a rate of 85 bpm.  Normal axis and intervals.  No clear signs of acute ischemia.  RADIOLOGY 2 view CXR interpreted by me with right basilar atelectasis  Official radiology report(s): MR THORACIC SPINE W WO CONTRAST  Result Date: 07/13/2023 CLINICAL DATA:  Increasing inflammatory markers,  concern for recurrence of osteomyelitis discitis EXAM: MRI THORACIC WITHOUT AND WITH CONTRAST TECHNIQUE: Multiplanar and multiecho pulse sequences of the thoracic spine were obtained without and with intravenous contrast. CONTRAST:  10mL GADAVIST GADOBUTROL 1 MMOL/ML IV SOLN COMPARISON:  04/19/2023 FINDINGS: Alignment: Straightening of the normal thoracic kyphosis. No significant listhesis in the thoracic spine. 4 mm retrolisthesis of T12 on L1. Vertebrae: Abnormal signal in the T5 and T6 vertebral bodies, with increased T2 and contrast enhancement signal throughout the vertebral bodies and in the disc space, which appears worse than on the prior exam, with progressive endplate erosions. Edema and enhancement are also noted in bilateral T5-T6 and T6-T7 facets (series 21, images 11 and 17 and series 27, images 11 and 18). Enhancing material at the right-greater-than-left lateral aspect of T5 and T6 (series 25, images 18-21), concerning for phlegmon. A small amount of enhancing material is also noted at the ventral aspect of the thecal sac at T5-T7 (series 27, image 13 and 16 and series 25, images 19-21), concerning for epidural phlegmon, which does not cause significant spinal canal stenosis. No evidence of acute fracture or suspicious osseous lesion. Chronic endplate degenerative changes at T8-T9. Cord: Normal signal and morphology. No abnormal spinal cord enhancement. Paraspinal and other soft tissues: No additional acute finding. Disc levels: No high-grade spinal canal stenosis or neural foraminal narrowing. IMPRESSION: 1. Findings concerning for worsening osteomyelitis-discitis at T5-T6, with progressive endplate erosions and edema and enhancement in bilateral T5-T6 and T6-T7 facets, concerning for septic arthritis. 2. Enhancing material at the right-greater-than-left lateral aspect of T5 and T6, concerning for phlegmon. A small amount of enhancing material is also noted at the ventral aspect of the thecal sac at  T5-T7, concerning for epidural phlegmon, which does not cause significant spinal canal stenosis. Electronically Signed   By: Wiliam Ke M.D.   On: 07/13/2023 03:22   DG Chest 2 View  Result Date: 07/12/2023 CLINICAL DATA:  Shortness of breath, COPD EXAM: CHEST - 2 VIEW COMPARISON:  04/29/2023 FINDINGS: Chronic elevation of the right hemidiaphragm. Right basilar opacity, likely atelectasis or scarring. Left lung clear. Heart is mildly enlarged. Mediastinal contours within normal limits. No acute bony abnormality. IMPRESSION: Cardiomegaly.  No overt edema. Right base atelectasis or scarring. Electronically Signed   By: Charlett Nose M.D.   On: 07/12/2023 21:01    PROCEDURES and INTERVENTIONS:  .1-3 Lead EKG Interpretation  Performed by: Delton Prairie, MD Authorized by: Delton Prairie,  MD     Interpretation: normal     ECG rate:  86   ECG rate assessment: normal     Rhythm: sinus rhythm     Ectopy: none     Conduction: normal   .Critical Care  Performed by: Delton Prairie, MD Authorized by: Delton Prairie, MD   Critical care provider statement:    Critical care time (minutes):  30   Critical care time was exclusive of:  Separately billable procedures and treating other patients   Critical care was necessary to treat or prevent imminent or life-threatening deterioration of the following conditions:  Sepsis   Critical care was time spent personally by me on the following activities:  Development of treatment plan with patient or surrogate, discussions with consultants, evaluation of patient's response to treatment, examination of patient, ordering and review of laboratory studies, ordering and review of radiographic studies, ordering and performing treatments and interventions, pulse oximetry, re-evaluation of patient's condition and review of old charts   Medications  ceFEPIme (MAXIPIME) 2 g in sodium chloride 0.9 % 100 mL IVPB (has no administration in time range)  vancomycin (VANCOREADY)  IVPB 2000 mg/400 mL (has no administration in time range)  lactated ringers bolus 1,000 mL (has no administration in time range)  lactated ringers infusion (has no administration in time range)  LORazepam (ATIVAN) tablet 1 mg (1 mg Oral Given 07/13/23 0153)  lactated ringers bolus 1,000 mL (1,000 mLs Intravenous New Bag/Given 07/13/23 0152)  gadobutrol (GADAVIST) 1 MMOL/ML injection 10 mL (10 mLs Intravenous Contrast Given 07/13/23 0245)     IMPRESSION / MDM / ASSESSMENT AND PLAN / ED COURSE  I reviewed the triage vital signs and the nursing notes.  Differential diagnosis includes, but is not limited to, sepsis, bacteremia, UTI, discitis/osteomyelitis, pneumonia  {Patient presents with symptoms of an acute illness or injury that is potentially life-threatening.  Patient presents with evidence of recurrence of sepsis due to osteomyelitis/discitis of the thoracic spine requiring readmission.  Reassuring vital signs but with leukocytosis, lactic acidosis suggestive of severe sepsis.  CKD near baseline.  Mild hyponatremia and hypokalemia.  CXR without clear infiltrates and his MRI of thoracic spine clearly shows worsening signs of osteomyelitis.  Cultures are drawn, antibiotics provided broad-spectrum and we will consult with medicine for admission.  Clinical Course as of 07/13/23 0354  Thu Jul 13, 2023  0352 Reassessed.  Discussed MRI results, sepsis and admission.  He is agreeable. [DS]    Clinical Course User Index [DS] Delton Prairie, MD     FINAL CLINICAL IMPRESSION(S) / ED DIAGNOSES   Final diagnoses:  Sepsis without acute organ dysfunction, due to unspecified organism [facility])  Discitis of thoracic region  Osteomyelitis of thoracic region [facility])     Rx / DC Orders   ED Discharge Orders     None        Note:  This document was prepared using Dragon voice recognition software and may include unintentional dictation errors.   Delton Prairie, MD 07/13/23 [phone]

## 2023-07-14 DIAGNOSIS — M4644 Discitis, unspecified, thoracic region: Secondary | ICD-10-CM | POA: Diagnosis not present

## 2023-07-14 LAB — CBC
HCT: 33.1 % — ABNORMAL LOW (ref 39.0–52.0)
Hemoglobin: 10.8 g/dL — ABNORMAL LOW (ref 13.0–17.0)
MCH: 30.4 pg (ref 26.0–34.0)
MCHC: 32.6 g/dL (ref 30.0–36.0)
MCV: 93.2 fL (ref 80.0–100.0)
Platelets: 272 10*3/uL (ref 150–400)
RBC: 3.55 MIL/uL — ABNORMAL LOW (ref 4.22–5.81)
RDW: 15.2 % (ref 11.5–15.5)
WBC: 17.4 10*3/uL — ABNORMAL HIGH (ref 4.0–10.5)
nRBC: 0 % (ref 0.0–0.2)

## 2023-07-14 LAB — BASIC METABOLIC PANEL
Anion gap: 15 (ref 5–15)
BUN: 73 mg/dL — ABNORMAL HIGH (ref 8–23)
CO2: 27 mmol/L (ref 22–32)
Calcium: 9.3 mg/dL (ref 8.9–10.3)
Chloride: 89 mmol/L — ABNORMAL LOW (ref 98–111)
Creatinine, Ser: 3.01 mg/dL — ABNORMAL HIGH (ref 0.61–1.24)
GFR, Estimated: 22 mL/min — ABNORMAL LOW (ref >60)
Glucose, Bld: 239 mg/dL — ABNORMAL HIGH (ref 70–99)
Potassium: 3 mmol/L — ABNORMAL LOW (ref 3.5–5.1)
Sodium: 131 mmol/L — ABNORMAL LOW (ref 135–145)

## 2023-07-14 LAB — MAGNESIUM: Magnesium: 1.8 mg/dL (ref 1.7–2.4)

## 2023-07-14 LAB — PHOSPHORUS: Phosphorus: 4.4 mg/dL (ref 2.5–4.6)

## 2023-07-14 LAB — C-REACTIVE PROTEIN: CRP: 2.9 mg/dL — ABNORMAL HIGH (ref <1.0)

## 2023-07-14 LAB — GLUCOSE, CAPILLARY
Glucose-Capillary: 214 mg/dL — ABNORMAL HIGH (ref 70–99)
Glucose-Capillary: 284 mg/dL — ABNORMAL HIGH (ref 70–99)
Glucose-Capillary: 244 mg/dL — ABNORMAL HIGH (ref 70–99)

## 2023-07-14 MED ORDER — POTASSIUM CHLORIDE CRYS ER 20 MEQ PO TBCR
40.0000 meq | EXTENDED_RELEASE_TABLET | Freq: Once | ORAL | Status: AC
Start: 2023-07-14 — End: 2023-07-14
  Administered 2023-07-14: 40 meq via ORAL
  Filled 2023-07-14: qty 2

## 2023-07-14 MED ORDER — FLUTICASONE PROPIONATE 50 MCG/ACT NA SUSP
1.0000 | Freq: Every day | NASAL | Status: DC
  Administered 2023-07-14 – 2023-07-15 (×2): 1 via NASAL
  Filled 2023-07-14: qty 16

## 2023-07-14 MED ORDER — INSULIN ASPART 100 UNIT/ML IJ SOLN
5.0000 [IU] | Freq: Three times a day (TID) | INTRAMUSCULAR | Status: DC
  Administered 2023-07-14 – 2023-07-15 (×4): 5 [IU] via SUBCUTANEOUS
  Filled 2023-07-14 (×4): qty 1

## 2023-07-14 MED ORDER — INSULIN GLARGINE-YFGN 100 UNIT/ML ~~LOC~~ SOLN
65.0000 [IU] | Freq: Every day | SUBCUTANEOUS | Status: DC
  Administered 2023-07-14 – 2023-07-15 (×2): 65 [IU] via SUBCUTANEOUS
  Filled 2023-07-14 (×2): qty 0.65

## 2023-07-14 NOTE — Plan of Care (Signed)
  Problem: Respiratory: Goal: Ability to maintain adequate ventilation will improve Outcome: Progressing   Problem: Coping: Goal: Ability to adjust to condition or change in health will improve Outcome: Progressing   Problem: Nutritional: Goal: Maintenance of adequate nutrition will improve Outcome: Progressing Goal: Progress toward achieving an optimal weight will improve Outcome: Progressing   Problem: Skin Integrity: Goal: Risk for impaired skin integrity will decrease Outcome: Progressing   Problem: Education: Goal: Knowledge of General Education information will improve Description: Including pain rating scale, medication(s)/side effects and non-pharmacologic comfort measures Outcome: Progressing

## 2023-07-14 NOTE — Progress Notes (Signed)
CM spoke with patient at bedside, patient voices living [place] with girlfriend and friend. [employer] supplies Oxygen, C-Pap and walker. Patient independent with ADL's and drove himself to ED. HH services just ended, patient could not remember name of Agency, however says he no longer need services. Per patient ws getting wound care to right leg, wound is healed and care is not longer needed. Patient denies any further needs at this time.

## 2023-07-14 NOTE — Progress Notes (Signed)
PROGRESS NOTE    Villa Hollrah  ZOX:[id number] DOB: November 30, 1950 DOA: 07/13/2023 PCP: Enid Baas, MD     Brief Narrative:  72 year old with history of COPD on 3 L nasal cannula, paroxysmal A-fib not on anticoagulation due to GI bleed, DM2, HTN, CKD stage IV, CHF, OSA, staph bacteremia and T5-T6 discitis, osteomyelitis diagnosed in August 2024 and readmitted for sepsis again in October due to AKI from daptomycin use.  Eventually completed 38 days of IV daptomycin and Rocephin followed by 30 days of cefadroxil and doxycycline and started doing better.  With follow-up visit with PCP on 11/18 showed worsening leukocytosis and inflammatory markers therefore advised him to come to the ED.  MRI in the ER showed worsening osteomyelitis/discitis of T5-T6.  Started on IV antibiotics, will consult ID. ID eventually monitoring off Abx and trending CRP.    Assessment & Plan:  Principal Problem:   Discitis of thoracic region    Recurrent T5-T6 discitis and osteomyelitis, POA. Possible sepsis MRI shows progression of thoracic discitis/osteomyelitis.  Dr Renold Don discussed case with neuroradiology.  Recommending monitoring patient off antibiotics at this time.  Trend CRP. Urine culture insignificant growth.  Patient is asymptomatic.   Chronic congestive heart failure with preserved EF (HCC) Euvolemic continue home GDMT  Hypokalemia - As needed repletion  Elevated anion gap - Likely from lactic acidosis.  Getting gentle hydration.  Repeat lab work.   Essential hypertension On Imdur, Aldactone, and will resume torsemide upon discharge   Paroxysmal atrial fibrillation (HCC) Continue amiodarone Not on anticoagulation due to history of GI bleed   Chronic kidney disease, stage 4 (severe) (HCC) At baseline creatinine 3.5   Chronic obstructive pulmonary disease (HCC) Bronchodilators   Hyperglycemia and type 2 diabetes mellitus with  with long-term current use of insulin (HCC) Basal,  Premeal insulin.  Sliding scale.  Adjust as necessary   Obesity, Class III, BMI 40-49.9 (morbid obesity) (HCC) Complicating factor to overall prognosis and care   Obstructive sleep apnea of adult Recommend CPAP   PT/OT-face-to-face done   DVT prophylaxis: Lovenox Code Status: Full code Family Communication:   Status is: Inpatient Remains inpatient appropriate because: Ongoing culture and inflammatory markers monitoring.  Hopefully home tomorrow  Subjective:  No new complaints  Examination:  General exam: Appears calm and comfortable  Respiratory system: Clear to auscultation. Respiratory effort normal. Cardiovascular system: S1 & S2 heard, RRR. No JVD, murmurs, rubs, gallops or clicks. No pedal edema. Gastrointestinal system: Abdomen is nondistended, soft and nontender. No organomegaly or masses felt. Normal bowel sounds heard. Central nervous system: Alert and oriented. No focal neurological deficits. Extremities: Symmetric 5 x 5 power. Skin: No rashes, lesions or ulcers Psychiatry: Judgement and insight appear normal. Mood & affect appropriate.                     Diet Orders (From admission, onward)     Start     Ordered   07/13/23 0413  Diet heart healthy/carb modified Room service appropriate? Yes; Fluid consistency: Thin  Diet effective now       Question Answer Comment  Diet-HS Snack? Nothing   Room service appropriate? Yes   Fluid consistency: Thin      07/13/23 0413            Objective: Vitals:   07/13/23 1510 07/13/23 1544 07/13/23 2335 07/14/23 0817  BP: 138/83 115/69 121/64 (!) 107/58  Pulse: 83 85 72 68  Resp: 20 20 18 16   Temp: 97.8  F (36.6 C)  97.6 F (36.4 C)   TempSrc:      SpO2: 94% 95% 100% 100%  Weight:      Height:        Intake/Output Summary (Last 24 hours) at 07/14/2023 1328 Last data filed at 07/14/2023 0900 Gross per 24 hour  Intake 557.78 ml  Output 2750 ml  Net -2192.22 ml   Filed Weights   07/12/23  1759 07/12/23 1803  Weight: (!) 137 kg (!) 137 kg    Scheduled Meds:  allopurinol  100 mg Oral Daily   amiodarone  200 mg Oral Daily   aspirin EC  81 mg Oral Daily   enoxaparin (LOVENOX) injection  30 mg Subcutaneous Q24H   fluticasone  1 spray Each Nare Daily   insulin aspart  0-15 Units Subcutaneous TID WC   insulin aspart  5 Units Subcutaneous TID WC   insulin glargine-yfgn  65 Units Subcutaneous Daily   isosorbide mononitrate  30 mg Oral Daily   levothyroxine  112 mcg Oral Q0600   mometasone-formoterol  2 puff Inhalation BID   spironolactone  25 mg Oral Daily   Continuous Infusions:  Nutritional status     Body mass index is 45.92 kg/m.  Data Reviewed:   CBC: Recent Labs  Lab 07/12/23 1805 07/13/23 0420 07/14/23 0429  WBC 16.0* 15.8* 17.4*  HGB 12.2* 10.9* 10.8*  HCT 36.7* 33.5* 33.1*  MCV 92.9 92.8 93.2  PLT 292 282 272   Basic Metabolic Panel: Recent Labs  Lab 07/12/23 1805 07/13/23 0420 07/14/23 0429  NA 129*  --  131*  K 3.0*  --  3.0*  CL 88*  --  89*  CO2 25  --  27  GLUCOSE 328*  --  239*  BUN 85*  --  73*  CREATININE 3.66* 3.45* 3.01*  CALCIUM 10.1  --  9.3  MG  --   --  1.8  PHOS  --   --  4.4   GFR: Estimated Creatinine Clearance: 30.9 mL/min (A) (by C-G formula based on SCr of 3.01 mg/dL (H)). Liver Function Tests: No results for input(s): "AST", "ALT", "ALKPHOS", "BILITOT", "PROT", "ALBUMIN" in the last 168 hours. No results for input(s): "LIPASE", "AMYLASE" in the last 168 hours. No results for input(s): "AMMONIA" in the last 168 hours. Coagulation Profile: No results for input(s): "INR", "PROTIME" in the last 168 hours. Cardiac Enzymes: No results for input(s): "CKTOTAL", "CKMB", "CKMBINDEX", "TROPONINI" in the last 168 hours. BNP (last 3 results) No results for input(s): "PROBNP" in the last 8760 hours. HbA1C: No results for input(s): "HGBA1C" in the last 72 hours. CBG: Recent Labs  Lab 07/13/23 1139 07/13/23 1652  07/13/23 2147 07/14/23 0745 07/14/23 1137  GLUCAP 273* 283* 308* 214* 284*   Lipid Profile: No results for input(s): "CHOL", "HDL", "LDLCALC", "TRIG", "CHOLHDL", "LDLDIRECT" in the last 72 hours. Thyroid Function Tests: No results for input(s): "TSH", "T4TOTAL", "FREET4", "T3FREE", "THYROIDAB" in the last 72 hours. Anemia Panel: No results for input(s): "VITAMINB12", "FOLATE", "FERRITIN", "TIBC", "IRON", "RETICCTPCT" in the last 72 hours. Sepsis Labs: Recent Labs  Lab 07/13/23 0149 07/13/23 0343  PROCALCITON 0.40  --   LATICACIDVEN 3.3* 3.2*    Recent Results (from the past 240 hour(s))  Blood culture (routine x 2)     Status: None (Preliminary result)   Collection Time: 07/13/23  1:49 AM   Specimen: BLOOD  Result Value Ref Range Status   Specimen Description BLOOD BLOOD LEFT ARM  Final  Special Requests   Final    BOTTLES DRAWN AEROBIC AND ANAEROBIC Blood Culture adequate volume   Culture   Final    NO GROWTH 1 DAY Performed at [facility], [address] [place], [place] [address]    Report Status PENDING  Incomplete  Urine Culture     Status: Abnormal (Preliminary result)   Collection Time: 07/13/23  1:49 AM   Specimen: Urine, Clean Catch  Result Value Ref Range Status   Specimen Description   Final    URINE, CLEAN CATCH Performed at [facility], [address] [place], [place] [address]    Special Requests   Final    NONE Performed at [facility], [address], [place], [place] [address]    Culture (A)  Final    10,000 COLONIES/mL PSEUDOMONAS AERUGINOSA SUSCEPTIBILITIES TO FOLLOW Performed at [facility], [address] [place], [place] [address]    Report Status PENDING  Incomplete  Blood culture (routine x 2)     Status: None (Preliminary result)   Collection Time: 07/13/23  1:50 AM   Specimen: BLOOD  Result Value Ref Range Status   Specimen Description BLOOD BLOOD RIGHT ARM  Final   Special Requests   Final     BOTTLES DRAWN AEROBIC AND ANAEROBIC Blood Culture adequate volume   Culture   Final    NO GROWTH 1 DAY Performed at [facility], [address] [place], [place] [address]    Report Status PENDING  Incomplete         Radiology Studies: MR THORACIC SPINE W WO CONTRAST  Result Date: 07/13/2023 CLINICAL DATA:  Increasing inflammatory markers, concern for recurrence of osteomyelitis discitis EXAM: MRI THORACIC WITHOUT AND WITH CONTRAST TECHNIQUE: Multiplanar and multiecho pulse sequences of the thoracic spine were obtained without and with intravenous contrast. CONTRAST:  10mL GADAVIST GADOBUTROL 1 MMOL/ML IV SOLN COMPARISON:  04/19/2023 FINDINGS: Alignment: Straightening of the normal thoracic kyphosis. No significant listhesis in the thoracic spine. 4 mm retrolisthesis of T12 on L1. Vertebrae: Abnormal signal in the T5 and T6 vertebral bodies, with increased T2 and contrast enhancement signal throughout the vertebral bodies and in the disc space, which appears worse than on the prior exam, with progressive endplate erosions. Edema and enhancement are also noted in bilateral T5-T6 and T6-T7 facets (series 21, images 11 and 17 and series 27, images 11 and 18). Enhancing material at the right-greater-than-left lateral aspect of T5 and T6 (series 25, images 18-21), concerning for phlegmon. A small amount of enhancing material is also noted at the ventral aspect of the thecal sac at T5-T7 (series 27, image 13 and 16 and series 25, images 19-21), concerning for epidural phlegmon, which does not cause significant spinal canal stenosis. No evidence of acute fracture or suspicious osseous lesion. Chronic endplate degenerative changes at T8-T9. Cord: Normal signal and morphology. No abnormal spinal cord enhancement. Paraspinal and other soft tissues: No additional acute finding. Disc levels: No high-grade spinal canal stenosis or neural foraminal narrowing. IMPRESSION: 1. Findings concerning for  worsening osteomyelitis-discitis at T5-T6, with progressive endplate erosions and edema and enhancement in bilateral T5-T6 and T6-T7 facets, concerning for septic arthritis. 2. Enhancing material at the right-greater-than-left lateral aspect of T5 and T6, concerning for phlegmon. A small amount of enhancing material is also noted at the ventral aspect of the thecal sac at T5-T7, concerning for epidural phlegmon, which does not cause significant spinal canal stenosis. Electronically Signed   By:  Wiliam Ke M.D.   On: 07/13/2023 03:22   DG Chest 2 View  Result Date: 07/12/2023 CLINICAL DATA:  Shortness of breath, COPD EXAM: CHEST - 2 VIEW COMPARISON:  04/29/2023 FINDINGS: Chronic elevation of the right hemidiaphragm. Right basilar opacity, likely atelectasis or scarring. Left lung clear. Heart is mildly enlarged. Mediastinal contours within normal limits. No acute bony abnormality. IMPRESSION: Cardiomegaly.  No overt edema. Right base atelectasis or scarring. Electronically Signed   By: Charlett Nose M.D.   On: 07/12/2023 21:01           LOS: 1 day   Time spent= 35 mins    Miguel Rota, MD [facility]  If 7PM-7AM, please contact night-coverage  07/14/2023, 1:28 PM

## 2023-07-14 NOTE — Evaluation (Signed)
Occupational Therapy Evaluation Patient Details Name: Jason Hudson MRN: [id number] DOB: [birth year] Today's Date: 07/14/2023   History of Present Illness Pt is a 72 yo M admitted for recurrent T5-T6 discitis and osteomyelitis,with possible sepsis. MD assessment includes chronic CHF, HFpEF, hypokalemia, HTN, paroxysmal afib, CKD stage IV, COPD, DM2, and OSA, on intermittent 2-3 L O2.   Clinical Impression   Pt was seen for OT evaluation this date. Prior to hospital admission, pt was generally modified independent but endorses having difficulty with pericare PRN requiring his girlfriend to assist, unable to don socks, and having difficulty tying shoes. Pt denies pain and endorses mild SOB with exertion. Pt presents to acute OT demonstrating impaired ADL performance and functional mobility 2/2 decreased activity tolerance and cardiopulmonary status (See OT problem list for additional functional deficits). Pt currently requires MIN A for socks/shoes and pericare. Pt educated in home/routines modifications, falls prevention, AE/DME, and ECS to support ADL/mobility safety and independence. Pt educated in various AE to improve pericare after toileting and LB dressing. Visual cues provided to review various equipment types. Pt verbalized understanding. Appreciative of instruction. Pt would benefit from skilled OT services to address noted impairments and functional limitations (see below for any additional details) in order to maximize safety and independence while minimizing falls risk and caregiver burden.     If plan is discharge home, recommend the following: A little help with bathing/dressing/bathroom;Assistance with cooking/housework;Help with stairs or ramp for entrance    Functional Status Assessment  Patient has had a recent decline in their functional status and demonstrates the ability to make significant improvements in function in a reasonable and predictable amount of time.   Equipment Recommendations  Other (comment) (toileting aide, sock aide, LH sponge)    Recommendations for Other Services       Precautions / Restrictions Precautions Precautions: Fall Restrictions Weight Bearing Restrictions: No      Mobility Bed Mobility               General bed mobility comments: n/a found in chair    Transfers Overall transfer level: Needs assistance Equipment used: Rolling walker (2 wheels) Transfers: Sit to/from Stand Sit to Stand: Supervision                  Balance Overall balance assessment: Mild deficits observed, not formally tested                                         ADL either performed or assessed with clinical judgement   ADL Overall ADL's : Needs assistance/impaired                                       General ADL Comments: Pt requires assist for socks, tying shoes, and PRN assist for pericare. Pt requires increased time/effort to complete LB ADL tasks otherwise and endorses mild SOB.     Vision         Perception         Praxis         Pertinent Vitals/Pain Pain Assessment Pain Assessment: No/denies pain     Extremity/Trunk Assessment Upper Extremity Assessment Upper Extremity Assessment: Overall WFL for tasks assessed   Lower Extremity Assessment Lower Extremity Assessment: Overall WFL for tasks assessed  Communication Communication Communication: No apparent difficulties Cueing Techniques: Verbal cues;Visual cues   Cognition Arousal: Alert Behavior During Therapy: WFL for tasks assessed/performed Overall Cognitive Status: Within Functional Limits for tasks assessed                                       General Comments       Exercises Other Exercises Other Exercises: Pt educated in home/routines modifications, falls prevention, AE/DME, and ECS to support ADL/mobility safety and independence. Pt educated in various AE to improve  pericare after toileting and LB dressing. Visual cues provided to review various equipment types.   Shoulder Instructions      Home Living Family/patient expects to be discharged to:: Private residence Living Arrangements: Spouse/significant other;Non-relatives/Friends Available Help at Discharge: Family;Available 24 hours/day;Friend(s) Type of Home: Mobile home Home Access: Stairs to enter Entrance Stairs-Number of Steps: 4 Entrance Stairs-Rails: Right;Left;Can reach both Home Layout: One level     Bathroom Shower/Tub: Tub/shower unit;Sponge bathes at baseline   [employer]:  (slightly elevated height) Bathroom Accessibility: Yes   Home Equipment: Cane - single point;Standard Walker;Shower seat;Hand held shower head;Adaptive equipment Adaptive Equipment: Reacher;Long-handled shoe horn Additional Comments: pt reports sleeping in recliner at baseline      Prior Functioning/Environment Prior Level of Function : Independent/Modified Independent;Needs assist             Mobility Comments: Pt reports mod I short community distance ambulator with intermittent SPC use/furniture walking; reports multiple falls in last 6 mo due to BP issues, now resolved ADLs Comments: Pt reports mod I, receiving assist prn from partner for toileting, cooking, cleaning, dressing; sponge baths currently - greatest challenges are with pericare after toileting, donning socks, and tying shoelaces        OT Problem List: Cardiopulmonary status limiting activity;Decreased activity tolerance;Decreased knowledge of use of DME or AE;Impaired balance (sitting and/or standing)      OT Treatment/Interventions: Self-care/ADL training;Therapeutic exercise;Therapeutic activities;Energy conservation;DME and/or AE instruction;Patient/family education;Balance training    OT Goals(Current goals can be found in the care plan section) Acute Rehab OT Goals Patient Stated Goal: get better and go home OT Goal  Formulation: With patient Time For Goal Achievement: 07/28/23 Potential to Achieve Goals: Good ADL Goals Pt Will Perform Lower Body Dressing: with modified independence;with adaptive equipment;sit to/from stand Pt Will Perform Toileting - Clothing Manipulation and hygiene: with modified independence;with adaptive equipment;sitting/lateral leans;sit to/from stand Additional ADL Goal #1: Pt will independently utilize at least 1 learned ECS or home/routine modification during ADL/mobility tasks to improve safety/indep while minimizing SOB, 3/3 opportunities.  OT Frequency: Min 1X/week    Co-evaluation              AM-PAC OT "6 Clicks" Daily Activity     Outcome Measure Help from another person eating meals?: None Help from another person taking care of personal grooming?: None Help from another person toileting, which includes using toliet, bedpan, or urinal?: A Little Help from another person bathing (including washing, rinsing, drying)?: A Little Help from another person to put on and taking off regular upper body clothing?: None Help from another person to put on and taking off regular lower body clothing?: A Little 6 Click Score: 21   End of Session Equipment Utilized During Treatment: Oxygen  Activity Tolerance: Patient tolerated treatment well Patient left: in chair;with call bell/phone within reach;with chair alarm set  OT Visit Diagnosis:  Other abnormalities of gait and mobility (R26.89)                Time: 5409-8119 OT Time Calculation (min): 16 min Charges:  OT General Charges $OT Visit: 1 Visit OT Evaluation $OT Eval Low Complexity: 1 Low OT Treatments $Self Care/Home Management : 8-22 mins  Arman Filter., MPH, MS, OTR/L ascom [phone] 07/14/23, 3:11 PM

## 2023-07-14 NOTE — Progress Notes (Signed)
PHARMACY CONSULT NOTE - ELECTROLYTES  Pharmacy Consult for Electrolyte Monitoring and Replacement   Recent Labs: Height: 5\' 8"  (172.7 cm) Weight: (!) 137 kg (302 lb) IBW/kg (Calculated) : 68.4 Estimated Creatinine Clearance: 30.9 mL/min (A) (by C-G formula based on SCr of 3.01 mg/dL (H)). Potassium (mmol/L)  Date Value  07/14/2023 3.0 (L)   Magnesium (mg/dL)  Date Value  93/81/8299 1.8   Calcium (mg/dL)  Date Value  37/16/9678 9.3   Albumin (g/dL)  Date Value  93/81/0175 3.3 (L)  10/21/2020 3.8   Phosphorus (mg/dL)  Date Value  06/15/8526 4.4   Sodium (mmol/L)  Date Value  07/14/2023 131 (L)  10/21/2020 137   Corrected Ca: 10.7 mg/dL  Assessment  Jason Hudson is a 72 y.o. male presenting with sepsis secondary to recurrent infection. PMH significant for staph lugdunensis bacteremia and T5-T6 discitis and osteomyelitis initially diagnosed 04/12/2023 s/p treatment. Pharmacy has been consulted to monitor and replace electrolytes.  Diet: PO MIVF: LR @ 40 mL/hr Pertinent medications: spironolactone  Goal of Therapy: Electrolytes WNL  Plan:  Give KCl 40 mEq PO x 1 again today. No recent Mg or Phos Check BMP, Mg, Phos with AM labs  Thank you for allowing pharmacy to be a part of this patient's care.  Elliot Gurney, PharmD, BCPS Clinical Pharmacist  07/14/2023 7:41 AM

## 2023-07-14 NOTE — Progress Notes (Addendum)
Occupational Therapy Treatment Patient Details Name: Jason Hudson MRN: [id number] DOB: 11/04/1950 Today's Date: 07/14/2023   History of present illness Pt is a 72 yo M admitted for recurrent T5-T6 discitis and osteomyelitis,with possible sepsis. MD assessment includes chronic CHF, HFpEF, hypokalemia, HTN, paroxysmal afib, CKD stage IV, COPD, DM2, and OSA, on intermittent 2-3 L O2.   OT comments  Pt seen for follow up treatment session to trial use of elastic shoe laces. OT further educated pt on benefits and use of elastic shoe laces to help improve his ability to don/doff his shoes more independently without needing to bend over or compromise his breathing. OT replaced laces with elastic ones and pt demonstrated ability to don on B feet from seated position without need to bend over and without direct assist after setup. Pt demonstrated ability to walk with RW and very pleased with fit and comfort of laces providing secure fit of shoes on feet for mobility. Pt very appreciative of session and laces, as he notes that finances are tight and he has not been able to afford slip on tennis shoes. Pt continues to benefit from skilled OT services to maximize return to PLOF.       If plan is discharge home, recommend the following:  A little help with bathing/dressing/bathroom;Assistance with cooking/housework;Help with stairs or ramp for entrance   Equipment Recommendations  Other (comment) (toileting aide, sock aide, LH sponge)    Recommendations for Other Services      Precautions / Restrictions Precautions Precautions: Fall Restrictions Weight Bearing Restrictions: No       Mobility Bed Mobility               General bed mobility comments: n/a found in chair    Transfers Overall transfer level: Needs assistance Equipment used: Rolling walker (2 wheels) Transfers: Sit to/from Stand Sit to Stand: Supervision           General transfer comment: supv, RW, and able  to take a few steps forward and backward with supv     Balance Overall balance assessment: Mild deficits observed, not formally tested                                         ADL either performed or assessed with clinical judgement   ADL Overall ADL's : Needs assistance/impaired                     Lower Body Dressing: Set up;Sit to/from stand;Supervision/safety Lower Body Dressing Details (indicate cue type and reason): Pt educated in elastic shoe laces to help improve his ability to don/doff his shoes more independently. OT replaced laces with elastic ones and pt demonstrated ability to don on B feet from seated position without need to bend over. Pt demonstrated ability to walk with RW and very pleased with fit and comfort of laces providing secure fit of shoes on feet for mobility.                   Extremity/Trunk Assessment Upper Extremity Assessment Upper Extremity Assessment: Overall WFL for tasks assessed   Lower Extremity Assessment Lower Extremity Assessment: Overall WFL for tasks assessed        Vision       Perception     Praxis      Cognition Arousal: Alert Behavior During Therapy: [facility] for tasks assessed/performed Overall  Cognitive Status: Within Functional Limits for tasks assessed                                          Exercises     Shoulder Instructions       General Comments      Pertinent Vitals/ Pain       Pain Assessment Pain Assessment: No/denies pain  Home Living Family/patient expects to be discharged to:: Private residence Living Arrangements: Spouse/significant other;Non-relatives/Friends Available Help at Discharge: Family;Available 24 hours/day;Friend(s) Type of Home: Mobile home Home Access: Stairs to enter Entrance Stairs-Number of Steps: 4 Entrance Stairs-Rails: Right;Left;Can reach both Home Layout: One level     Bathroom Shower/Tub: Tub/shower unit;Sponge bathes at  baseline   [employer]:  (slightly elevated height) Bathroom Accessibility: Yes   Home Equipment: Cane - single point;Standard Walker;Shower seat;Hand held shower head;Adaptive equipment Adaptive Equipment: Reacher;Long-handled shoe horn Additional Comments: pt reports sleeping in recliner at baseline      Prior Functioning/Environment              Frequency  Min 1X/week        Progress Toward Goals  OT Goals(current goals can now be found in the care plan section)  Progress towards OT goals: Progressing toward goals  Acute Rehab OT Goals Patient Stated Goal: get better and go home OT Goal Formulation: With patient Time For Goal Achievement: 07/28/23 Potential to Achieve Goals: Good ADL Goals Pt Will Perform Lower Body Dressing: with modified independence;with adaptive equipment;sit to/from stand Pt Will Perform Toileting - Clothing Manipulation and hygiene: with modified independence;with adaptive equipment;sitting/lateral leans;sit to/from stand Additional ADL Goal #1: Pt will independently utilize at least 1 learned ECS or home/routine modification during ADL/mobility tasks to improve safety/indep while minimizing SOB, 3/3 opportunities.  Plan      Co-evaluation                 AM-PAC OT "6 Clicks" Daily Activity     Outcome Measure   Help from another person eating meals?: None Help from another person taking care of personal grooming?: None Help from another person toileting, which includes using toliet, bedpan, or urinal?: A Little Help from another person bathing (including washing, rinsing, drying)?: A Little Help from another person to put on and taking off regular upper body clothing?: None Help from another person to put on and taking off regular lower body clothing?: None 6 Click Score: 18    End of Session Equipment Utilized During Treatment: Oxygen  OT Visit Diagnosis: Other abnormalities of gait and mobility (R26.89)   Activity  Tolerance Patient tolerated treatment well   Patient Left in chair;with call bell/phone within reach   Nurse Communication          Time: 2956-2130 OT Time Calculation (min): 18 min  Charges: OT General Charges $OT Visit: 1 Visit OT Treatments $Self Care/Home Management : 8-22 mins  Arman Filter., MPH, MS, OTR/L ascom [phone] 07/14/23, 4:32 PM

## 2023-07-14 NOTE — Evaluation (Signed)
Physical Therapy Evaluation Patient Details Name: Jason Hudson MRN: [id number] DOB: 1951/04/21 Today's Date: 07/14/2023  History of Present Illness  Pt is a 72 yo M admitted for recurrent T5-T6 discitis and osteomyelitis,with possible sepsis. MD assessment includes chronic CHF, HFpEF, hypokalemia, HTN, paroxysmal afib, CKD stage IV, COPD, DM2, and OSA, on intermittent 2-3 L O2.  Clinical Impression  Pt was pleasant and motivated to participate during the session and put forth good effort throughout. Pt presented A,O x4 and able to confirm subjective info. Pt was able to complete transfers with Superv for safety but exhibited capable BUE and BLE strength over multiple attempts, cuing given for safety/efficiency. Pt was able to amb in hallway with Superv for safety, used both RW and BRW reporting preference for standard RW. Pt's vitals were monitored during session and remained WNL throughout on 3 L O2, pt reported no adverse symptoms throughout. Pt will benefit from continued PT services upon discharge to safely address deficits listed in patient problem list for decreased caregiver assistance and eventual return to PLOF.       If plan is discharge home, recommend the following: Assistance with cooking/housework;Assist for transportation;Help with stairs or ramp for entrance;A little help with bathing/dressing/bathroom   Can travel by private vehicle        Equipment Recommendations Rolling walker (2 wheels)  Recommendations for Other Services       Functional Status Assessment Patient has had a recent decline in their functional status and demonstrates the ability to make significant improvements in function in a reasonable and predictable amount of time.     Precautions / Restrictions Precautions Precautions: Fall Restrictions Weight Bearing Restrictions: No      Mobility  Bed Mobility               General bed mobility comments: n/a found in chair     Transfers Overall transfer level: Needs assistance Equipment used: Rolling walker (2 wheels) Transfers: Sit to/from Stand Sit to Stand: Supervision           General transfer comment: able to rise quickly with heavy use of BUE pushoff, cuing for hand placement and controlled descent for safety    Ambulation/Gait Ambulation/Gait assistance: Supervision Gait Distance (Feet): 200 Feet Assistive device: Rolling walker (2 wheels) (also trialed BRW) Gait Pattern/deviations: Step-through pattern, Decreased step length - right, Decreased step length - left, Decreased stride length Gait velocity: decreased     General Gait Details: slow but consistent cadence, good use of RW, no real unsteadiness or weakness observed throughout, min BUE reliance on AD, cuing for posture and body placement close to [employer] Mobility     Tilt Bed    Modified Rankin (Stroke Patients Only)       Balance Overall balance assessment: Needs assistance Sitting-balance support: Feet supported, No upper extremity supported Sitting balance-Leahy Scale: Normal     Standing balance support: Bilateral upper extremity supported, During functional activity Standing balance-Leahy Scale: Good Standing balance comment: steady in static stand, no observed unsteadiness with dynamic standing with RW                             Pertinent Vitals/Pain Pain Assessment Pain Assessment: 0-10 Pain Score: 2  ((reported normal)) Pain Location: bilat feet Pain Descriptors / Indicators: Discomfort Pain Intervention(s): Monitored during session    Home Living Family/patient expects to  be discharged to:: Private residence Living Arrangements: Spouse/significant other;Non-relatives/Friends Available Help at Discharge: Family;Available 24 hours/day;Friend(s) Type of Home: Mobile home Home Access: Stairs to enter Entrance Stairs-Rails: Right;Left;Can reach both Entrance  Stairs-Number of Steps: 4   Home Layout: One level Home Equipment: Cane - single point;Standard Walker Additional Comments: pt reports sleeping in recliner at baseline    Prior Function Prior Level of Function : Independent/Modified Independent;Needs assist             Mobility Comments: Pt reports mod I short community distance ambulator with intermittent SPC use/furniture walking; reports multiple falls in last 6 mo due to BP issues, now resolved ADLs Comments: Pt reports mod I, receiving assist prn from partner for toileting, cooking, cleaning, dressing; sponge baths currently     Extremity/Trunk Assessment   Upper Extremity Assessment Upper Extremity Assessment: Overall WFL for tasks assessed    Lower Extremity Assessment Lower Extremity Assessment: Overall WFL for tasks assessed       Communication   Communication Communication: No apparent difficulties Cueing Techniques: Verbal cues;Visual cues  Cognition Arousal: Alert Behavior During Therapy: WFL for tasks assessed/performed Overall Cognitive Status: Within Functional Limits for tasks assessed                                          General Comments      Exercises     Assessment/Plan    PT Assessment Patient needs continued PT services  PT Problem List Decreased strength;Decreased coordination;Decreased range of motion;Decreased activity tolerance;Decreased balance;Decreased mobility;Decreased knowledge of use of DME;Cardiopulmonary status limiting activity       PT Treatment Interventions DME instruction;Balance training;Gait training;Stair training;Functional mobility training;Therapeutic activities;Therapeutic exercise;Patient/family education    PT Goals (Current goals can be found in the Care Plan section)  Acute Rehab PT Goals Patient Stated Goal: increase walking endurance PT Goal Formulation: With patient Time For Goal Achievement: 07/27/23 Potential to Achieve Goals:  Good    Frequency Min 1X/week     Co-evaluation               AM-PAC PT "6 Clicks" Mobility  Outcome Measure Help needed turning from your back to your side while in a flat bed without using bedrails?: A Little Help needed moving from lying on your back to sitting on the side of a flat bed without using bedrails?: A Lot Help needed moving to and from a bed to a chair (including a wheelchair)?: None Help needed standing up from a chair using your arms (e.g., wheelchair or bedside chair)?: None Help needed to walk in hospital room?: A Little Help needed climbing 3-5 steps with a railing? : A Little 6 Click Score: 19    End of Session Equipment Utilized During Treatment: Gait belt Activity Tolerance: Patient tolerated treatment well Patient left: in chair;with call bell/phone within reach Nurse Communication: Mobility status;Other (comment) (chair alarm turned off) PT Visit Diagnosis: Difficulty in walking, not elsewhere classified (R26.2);Muscle weakness (generalized) (M62.81)    Time: 4782-9562 PT Time Calculation (min) (ACUTE ONLY): 27 min   Charges:               Rosiland Oz SPT 07/14/23, 1:46 PM

## 2023-07-14 NOTE — Progress Notes (Signed)
   07/14/23 1200  Spiritual Encounters  Type of Visit Initial  Care provided to: Patient  Reason for visit Advance directives  OnCall Visit Yes  Interventions  Spiritual Care Interventions Made Established relationship of care and support  Spiritual Care Plan  Spiritual Care Issues Still Outstanding No further spiritual care needs at this time (see row info)   Spoke with patient they did not care to do advance directive and said they already had a ad. So, I let patient know that we are here for them if they have any other need or needs.

## 2023-07-15 DIAGNOSIS — M4644 Discitis, unspecified, thoracic region: Secondary | ICD-10-CM | POA: Diagnosis not present

## 2023-07-15 LAB — BASIC METABOLIC PANEL
Anion gap: 12 (ref 5–15)
BUN: 66 mg/dL — ABNORMAL HIGH (ref 8–23)
CO2: 28 mmol/L (ref 22–32)
Calcium: 8.9 mg/dL (ref 8.9–10.3)
Chloride: 91 mmol/L — ABNORMAL LOW (ref 98–111)
Creatinine, Ser: 2.65 mg/dL — ABNORMAL HIGH (ref 0.61–1.24)
GFR, Estimated: 25 mL/min — ABNORMAL LOW (ref >60)
Glucose, Bld: 289 mg/dL — ABNORMAL HIGH (ref 70–99)
Potassium: 3.3 mmol/L — ABNORMAL LOW (ref 3.5–5.1)
Sodium: 131 mmol/L — ABNORMAL LOW (ref 135–145)

## 2023-07-15 LAB — CBC
HCT: 35.5 % — ABNORMAL LOW (ref 39.0–52.0)
Hemoglobin: 11.5 g/dL — ABNORMAL LOW (ref 13.0–17.0)
MCH: 30.2 pg (ref 26.0–34.0)
MCHC: 32.4 g/dL (ref 30.0–36.0)
MCV: 93.2 fL (ref 80.0–100.0)
Platelets: 290 10*3/uL (ref 150–400)
RBC: 3.81 MIL/uL — ABNORMAL LOW (ref 4.22–5.81)
RDW: 15.3 % (ref 11.5–15.5)
WBC: 17.3 10*3/uL — ABNORMAL HIGH (ref 4.0–10.5)
nRBC: 0 % (ref 0.0–0.2)

## 2023-07-15 LAB — URINE CULTURE: Culture: 10000 — AB

## 2023-07-15 LAB — GLUCOSE, CAPILLARY: Glucose-Capillary: 269 mg/dL — ABNORMAL HIGH (ref 70–99)

## 2023-07-15 LAB — C-REACTIVE PROTEIN: CRP: 5.8 mg/dL — ABNORMAL HIGH (ref <1.0)

## 2023-07-15 MED ORDER — ENOXAPARIN SODIUM 40 MG/0.4ML IJ SOSY
40.0000 mg | PREFILLED_SYRINGE | INTRAMUSCULAR | Status: DC
  Administered 2023-07-15: 40 mg via SUBCUTANEOUS
  Filled 2023-07-15: qty 0.4

## 2023-07-15 MED ORDER — POTASSIUM CHLORIDE CRYS ER 20 MEQ PO TBCR
40.0000 meq | EXTENDED_RELEASE_TABLET | Freq: Once | ORAL | Status: AC
  Administered 2023-07-15: 40 meq via ORAL
  Filled 2023-07-15: qty 2

## 2023-07-15 NOTE — Discharge Summary (Signed)
Physician Discharge Summary  Vikram Stauffacher WGN:[id number] DOB: [birth year] DOA: 07/13/2023  PCP: Enid Baas, MD  Admit date: 07/13/2023 Discharge date: 07/15/2023  Admitted From: Home Disposition: Home Home  Recommendations for Outpatient Follow-up:  Follow up with PCP in 1-2 weeks Please obtain BMP/CBC in one week your next doctors visit.  For outpatient infectious disease, 12/12 at 9:30 AM with Dr. Rivka Safer Advised to hold off on home torsemide as renal function is improving.  Patient has no signs of volume overload at this time.  Advised to repeat blood work in next 1 week and follow-up with PCP regarding further diuretic use.   Discharge Condition: Stable CODE STATUS: Full code Diet recommendation: Diabetic   Brief Narrative:  72 year old with history of COPD on 3 L nasal cannula, paroxysmal A-fib not on anticoagulation due to GI bleed, DM2, HTN, CKD stage IV, CHF, OSA, staph bacteremia and T5-T6 discitis, osteomyelitis diagnosed in August 2024 and readmitted for sepsis again in October due to AKI from daptomycin use.  Eventually completed 38 days of IV daptomycin and Rocephin followed by 30 days of cefadroxil and doxycycline and started doing better.  With follow-up visit with PCP on 11/18 showed worsening leukocytosis and inflammatory markers therefore advised him to come to the ED.  MRI in the ER showed worsening osteomyelitis/discitis of T5-T6.  Started on IV antibiotics, will consult ID. ID eventually monitoring off Abx and trending CRP.  Blood cultures remain negative, urine cultures grew small colony of Pseudomonas.  Patient is asymptomatic at this time.   Assessment & Plan:  Principal Problem:   Discitis of thoracic region    Recurrent T5-T6 discitis and osteomyelitis, POA. Possible sepsis MRI shows progression of thoracic discitis/osteomyelitis.  Dr Renold Don discussed case with neuroradiology.  Recommending monitoring patient off antibiotics at this time.   Cultures remain negative. Follow-up outpatient with Dr. Rivka Safer on 12/12 9:30 AM. Urine culture insignificant growth of Pseudomonas.  Patient is asymptomatic.   Chronic congestive heart failure with preserved EF (HCC) Euvolemic continue home GDMT.  Advised to hold off on diuretics at home as renal function is improving.  Repeat blood work in about 1 week  Hypokalemia - As needed repletion  Elevated anion gap Resolved with fluids   Essential hypertension On Imdur, Aldactone.  AKI improving, holding off on torsemide   Paroxysmal atrial fibrillation (HCC) Continue amiodarone Not on anticoagulation due to history of GI bleed   Chronic kidney disease, stage 4 (severe) (HCC) At baseline creatinine 3.0.  Creatinine peaked 3.66, today 2.65.  Advised to hold off on home diuretics until outpatient repeat blood work.  This   Chronic obstructive pulmonary disease (HCC) Bronchodilators   Hyperglycemia and type 2 diabetes mellitus with  with long-term current use of insulin (HCC) Basal, Premeal insulin.  Sliding scale.  Adjust as necessary   Obesity, Class III, BMI 40-49.9 (morbid obesity) (HCC) Complicating factor to overall prognosis and care   Obstructive sleep apnea of adult Recommend CPAP   PT/OT-face-to-face done   DVT prophylaxis: Lovenox Code Status: Full code Family Communication:   Status is: Inpatient Remains inpatient appropriate because: DC today  Subjective: Feels great no complaints.  Remains afebrile   Examination:  General exam: Appears calm and comfortable  Respiratory system: Clear to auscultation. Respiratory effort normal. Cardiovascular system: S1 & S2 heard, RRR. No JVD, murmurs, rubs, gallops or clicks. No pedal edema. Gastrointestinal system: Abdomen is nondistended, soft and nontender. No organomegaly or masses felt. Normal bowel sounds heard. Central nervous system: Alert  and oriented. No focal neurological deficits. Extremities: Symmetric 5 x  5 power. Skin: No rashes, lesions or ulcers Psychiatry: Judgement and insight appear normal. Mood & affect appropriate.         Discharge Diagnoses:  Principal Problem:   Discitis of thoracic region      Consultations: Infectious disease     Discharge Exam: Vitals:   07/14/23 2101 07/15/23 0808  BP: 136/75 135/71  Pulse: 84 77  Resp: 18 18  Temp: 98.5 F (36.9 C) 97.9 F (36.6 C)  SpO2: 99% 99%   Vitals:   07/14/23 0817 07/14/23 1756 07/14/23 2101 07/15/23 0808  BP: (!) 107/58 130/76 136/75 135/71  Pulse: 68 82 84 77  Resp: 16 14 18 18   Temp:   98.5 F (36.9 C) 97.9 F (36.6 C)  TempSrc:   Oral   SpO2: 100% 100% 99% 99%  Weight:      Height:          Discharge Instructions   Allergies as of 07/15/2023       Reactions   Atorvastatin Other (See Comments)   Other reaction(s): Not available Other reaction(s): Other (See Comments)   Red Dye #40 (allura Red) Other (See Comments)   "not as allergic to it as I used to be. I can take a little of it"        Medication List     TAKE these medications    albuterol 108 (90 Base) MCG/ACT inhaler Commonly known as: VENTOLIN HFA Inhale 1-2 puffs into the lungs every 6 (six) hours as needed for wheezing or shortness of breath.   allopurinol 100 MG tablet Commonly known as: ZYLOPRIM TAKE ONE TABLET (100 MG) BY MOUTH ONCE DAILY   amiodarone 200 MG tablet Commonly known as: PACERONE Take 1 tablet (200 mg total) by mouth daily.   aspirin EC 81 MG tablet Take 81 mg by mouth daily. Swallow whole.   budesonide-formoterol 160-4.5 MCG/ACT inhaler Commonly known as: SYMBICORT Inhale 2 puffs into the lungs 2 (two) times daily.   colchicine 0.6 MG tablet Take 0.6 mg by mouth daily.   diphenhydrAMINE 25 mg capsule Commonly known as: BENADRYL Take 25 mg by mouth every 6 (six) hours as needed for itching or allergies.   ipratropium 0.02 % nebulizer solution Commonly known as: ATROVENT Take 2.5 mLs  (0.5 mg total) by nebulization every 6 (six) hours as needed for wheezing or shortness of breath.   isosorbide mononitrate 30 MG 24 hr tablet Commonly known as: IMDUR Take 30 mg by mouth daily.   levothyroxine 112 MCG tablet Commonly known as: SYNTHROID Take 112 mcg by mouth daily before breakfast.   loratadine 10 MG tablet Commonly known as: CLARITIN Take 10 mg by mouth daily.   multivitamin Tabs tablet Take 1 tablet by mouth daily.   nitroGLYCERIN 0.4 MG SL tablet Commonly known as: NITROSTAT Place 1 tablet (0.4 mg total) under the tongue every 5 (five) minutes as needed for chest pain.   oxycodone 5 MG capsule Commonly known as: OXY-IR Take 5 mg by mouth every 6 (six) hours as needed for pain.   potassium chloride 10 MEQ tablet Commonly known as: KLOR-CON TAKE TWO TABLETS EVERY OTHER DAY AND 1 TABLET ON THE DAYS IN BETWEEN What changed: See the new instructions.   spironolactone 25 MG tablet Commonly known as: ALDACTONE Take 25 mg by mouth daily.   torsemide 20 MG tablet Commonly known as: DEMADEX Take 80 mg by mouth daily.  Evaristo Bury FlexTouch 100 UNIT/ML FlexTouch Pen Generic drug: insulin degludec 120 Units 2 (two) times daily.        Follow-up Information     Enid Baas, MD Follow up in 1 week(s).   Specialty: Internal Medicine Contact information: [address] [place] [address] [phone]         Lynn Ito, MD. Go to.   Specialty: Infectious Diseases Why: 08/03/23, 9:30am Contact information: [address] [place] [address] [phone]                Allergies  Allergen Reactions   Atorvastatin Other (See Comments)    Other reaction(s): Not available Other reaction(s): Other (See Comments)    Red Dye #40 (Allura Red) Other (See Comments)    "not as allergic to it as I used to be. I can take a little of it"    You were cared for by a hospitalist during your hospital stay. If you  have any questions about your discharge medications or the care you received while you were in the hospital after you are discharged, you can call the unit and asked to speak with the hospitalist on call if the hospitalist that took care of you is not available. Once you are discharged, your primary care physician will handle any further medical issues. Please note that no refills for any discharge medications will be authorized once you are discharged, as it is imperative that you return to your primary care physician (or establish a relationship with a primary care physician if you do not have one) for your aftercare needs so that they can reassess your need for medications and monitor your lab values.  You were cared for by a hospitalist during your hospital stay. If you have any questions about your discharge medications or the care you received while you were in the hospital after you are discharged, you can call the unit and asked to speak with the hospitalist on call if the hospitalist that took care of you is not available. Once you are discharged, your primary care physician will handle any further medical issues. Please note that NO REFILLS for any discharge medications will be authorized once you are discharged, as it is imperative that you return to your primary care physician (or establish a relationship with a primary care physician if you do not have one) for your aftercare needs so that they can reassess your need for medications and monitor your lab values.  Please request your Prim.MD to go over all Hospital Tests and Procedure/Radiological results at the follow up, please get all Hospital records sent to your Prim MD by signing hospital release before you go home.  Get CBC, CMP, 2 view Chest X ray checked  by Primary MD during your next visit or SNF MD in 5-7 days ( we routinely change or add medications that can affect your baseline labs and fluid status, therefore we recommend that you get  the mentioned basic workup next visit with your PCP, your PCP may decide not to get them or add new tests based on their clinical decision)  On your next visit with your primary care physician please Get Medicines reviewed and adjusted.  If you experience worsening of your admission symptoms, develop shortness of breath, life threatening emergency, suicidal or homicidal thoughts you must seek medical attention immediately by calling 911 or calling your MD immediately  if symptoms less severe.  You Must read complete instructions/literature along with all the  possible adverse reactions/side effects for all the Medicines you take and that have been prescribed to you. Take any new Medicines after you have completely understood and accpet all the possible adverse reactions/side effects.   Do not drive, operate heavy machinery, perform activities at heights, swimming or participation in water activities or provide baby sitting services if your were admitted for syncope or siezures until you have seen by Primary MD or a Neurologist and advised to do so again.  Do not drive when taking Pain medications.   Procedures/Studies: MR THORACIC SPINE W WO CONTRAST  Result Date: 07/13/2023 CLINICAL DATA:  Increasing inflammatory markers, concern for recurrence of osteomyelitis discitis EXAM: MRI THORACIC WITHOUT AND WITH CONTRAST TECHNIQUE: Multiplanar and multiecho pulse sequences of the thoracic spine were obtained without and with intravenous contrast. CONTRAST:  10mL GADAVIST GADOBUTROL 1 MMOL/ML IV SOLN COMPARISON:  04/19/2023 FINDINGS: Alignment: Straightening of the normal thoracic kyphosis. No significant listhesis in the thoracic spine. 4 mm retrolisthesis of T12 on L1. Vertebrae: Abnormal signal in the T5 and T6 vertebral bodies, with increased T2 and contrast enhancement signal throughout the vertebral bodies and in the disc space, which appears worse than on the prior exam, with progressive endplate  erosions. Edema and enhancement are also noted in bilateral T5-T6 and T6-T7 facets (series 21, images 11 and 17 and series 27, images 11 and 18). Enhancing material at the right-greater-than-left lateral aspect of T5 and T6 (series 25, images 18-21), concerning for phlegmon. A small amount of enhancing material is also noted at the ventral aspect of the thecal sac at T5-T7 (series 27, image 13 and 16 and series 25, images 19-21), concerning for epidural phlegmon, which does not cause significant spinal canal stenosis. No evidence of acute fracture or suspicious osseous lesion. Chronic endplate degenerative changes at T8-T9. Cord: Normal signal and morphology. No abnormal spinal cord enhancement. Paraspinal and other soft tissues: No additional acute finding. Disc levels: No high-grade spinal canal stenosis or neural foraminal narrowing. IMPRESSION: 1. Findings concerning for worsening osteomyelitis-discitis at T5-T6, with progressive endplate erosions and edema and enhancement in bilateral T5-T6 and T6-T7 facets, concerning for septic arthritis. 2. Enhancing material at the right-greater-than-left lateral aspect of T5 and T6, concerning for phlegmon. A small amount of enhancing material is also noted at the ventral aspect of the thecal sac at T5-T7, concerning for epidural phlegmon, which does not cause significant spinal canal stenosis. Electronically Signed   By: Wiliam Ke M.D.   On: 07/13/2023 03:22   DG Chest 2 View  Result Date: 07/12/2023 CLINICAL DATA:  Shortness of breath, COPD EXAM: CHEST - 2 VIEW COMPARISON:  04/29/2023 FINDINGS: Chronic elevation of the right hemidiaphragm. Right basilar opacity, likely atelectasis or scarring. Left lung clear. Heart is mildly enlarged. Mediastinal contours within normal limits. No acute bony abnormality. IMPRESSION: Cardiomegaly.  No overt edema. Right base atelectasis or scarring. Electronically Signed   By: Charlett Nose M.D.   On: 07/12/2023 21:01     The  results of significant diagnostics from this hospitalization (including imaging, microbiology, ancillary and laboratory) are listed below for reference.     Microbiology: Recent Results (from the past 240 hour(s))  Blood culture (routine x 2)     Status: None (Preliminary result)   Collection Time: 07/13/23  1:49 AM   Specimen: BLOOD  Result Value Ref Range Status   Specimen Description BLOOD BLOOD LEFT ARM  Final   Special Requests   Final    BOTTLES DRAWN AEROBIC AND  ANAEROBIC Blood Culture adequate volume   Culture   Final    NO GROWTH 2 DAYS Performed at [facility], [address] [place], [place] [address]    Report Status PENDING  Incomplete  Urine Culture     Status: Abnormal   Collection Time: 07/13/23  1:49 AM   Specimen: Urine, Clean Catch  Result Value Ref Range Status   Specimen Description   Final    URINE, CLEAN CATCH Performed at [facility], [address] [place], [place] [address]    Special Requests   Final    NONE Performed at [facility], [address] [place], [place] [address]    Culture 10,000 COLONIES/mL PSEUDOMONAS AERUGINOSA (A)  Final   Report Status 07/15/2023 FINAL  Final   Organism ID, Bacteria PSEUDOMONAS AERUGINOSA (A)  Final      Susceptibility   Pseudomonas aeruginosa - MIC*    CEFTAZIDIME 4 SENSITIVE Sensitive     CIPROFLOXACIN <=0.25 SENSITIVE Sensitive     GENTAMICIN <=1 SENSITIVE Sensitive     IMIPENEM 1 SENSITIVE Sensitive     PIP/TAZO 8 SENSITIVE Sensitive ug/mL    CEFEPIME 2 SENSITIVE Sensitive     * 10,000 COLONIES/mL PSEUDOMONAS AERUGINOSA  Blood culture (routine x 2)     Status: None (Preliminary result)   Collection Time: 07/13/23  1:50 AM   Specimen: BLOOD  Result Value Ref Range Status   Specimen Description BLOOD BLOOD RIGHT ARM  Final   Special Requests   Final    BOTTLES DRAWN AEROBIC AND ANAEROBIC Blood Culture adequate volume   Culture   Final    NO GROWTH 2 DAYS Performed at  [facility], [address] [place], [place] [address]    Report Status PENDING  Incomplete     Labs: BNP (last 3 results) Recent Labs    04/12/23 1305 07/12/23 1805  BNP 89.7 46.1   Basic Metabolic Panel: Recent Labs  Lab 07/12/23 1805 07/13/23 0420 07/14/23 0429 07/15/23 0425  NA 129*  --  131* 131*  K 3.0*  --  3.0* 3.3*  CL 88*  --  89* 91*  CO2 25  --  27 28  GLUCOSE 328*  --  239* 289*  BUN 85*  --  73* 66*  CREATININE 3.66* 3.45* 3.01* 2.65*  CALCIUM 10.1  --  9.3 8.9  MG  --   --  1.8  --   PHOS  --   --  4.4  --    Liver Function Tests: No results for input(s): "AST", "ALT", "ALKPHOS", "BILITOT", "PROT", "ALBUMIN" in the last 168 hours. No results for input(s): "LIPASE", "AMYLASE" in the last 168 hours. No results for input(s): "AMMONIA" in the last 168 hours. CBC: Recent Labs  Lab 07/12/23 1805 07/13/23 0420 07/14/23 0429 07/15/23 0425  WBC 16.0* 15.8* 17.4* 17.3*  HGB 12.2* 10.9* 10.8* 11.5*  HCT 36.7* 33.5* 33.1* 35.5*  MCV 92.9 92.8 93.2 93.2  PLT 292 282 272 290   Cardiac Enzymes: No results for input(s): "CKTOTAL", "CKMB", "CKMBINDEX", "TROPONINI" in the last 168 hours. BNP: Invalid input(s): "POCBNP" CBG: Recent Labs  Lab 07/13/23 2147 07/14/23 0745 07/14/23 1137 07/14/23 1654 07/15/23 0808  GLUCAP 308* 214* 284* 244* 269*   D-Dimer No results for input(s): "DDIMER" in the last 72 hours. Hgb A1c No results for input(s): "HGBA1C" in the last 72 hours. Lipid Profile No results for input(s): "CHOL", "HDL", "LDLCALC", "TRIG", "CHOLHDL", "LDLDIRECT" in the last  72 hours. Thyroid function studies No results for input(s): "TSH", "T4TOTAL", "T3FREE", "THYROIDAB" in the last 72 hours.  Invalid input(s): "FREET3" Anemia work up No results for input(s): "VITAMINB12", "FOLATE", "FERRITIN", "TIBC", "IRON", "RETICCTPCT" in the last 72 hours. Urinalysis    Component Value Date/Time   COLORURINE STRAW (A) 07/13/2023 0149    APPEARANCEUR CLEAR (A) 07/13/2023 0149   APPEARANCEUR Clear 01/05/2022 1324   LABSPEC 1.008 07/13/2023 0149   PHURINE 7.0 07/13/2023 0149   GLUCOSEU 50 (A) 07/13/2023 0149   HGBUR NEGATIVE 07/13/2023 0149   BILIRUBINUR NEGATIVE 07/13/2023 0149   BILIRUBINUR Negative 01/05/2022 1324   KETONESUR NEGATIVE 07/13/2023 0149   PROTEINUR NEGATIVE 07/13/2023 0149   NITRITE NEGATIVE 07/13/2023 0149   LEUKOCYTESUR NEGATIVE 07/13/2023 0149   Sepsis Labs Recent Labs  Lab 07/12/23 1805 07/13/23 0420 07/14/23 0429 07/15/23 0425  WBC 16.0* 15.8* 17.4* 17.3*   Microbiology Recent Results (from the past 240 hour(s))  Blood culture (routine x 2)     Status: None (Preliminary result)   Collection Time: 07/13/23  1:49 AM   Specimen: BLOOD  Result Value Ref Range Status   Specimen Description BLOOD BLOOD LEFT ARM  Final   Special Requests   Final    BOTTLES DRAWN AEROBIC AND ANAEROBIC Blood Culture adequate volume   Culture   Final    NO GROWTH 2 DAYS Performed at [facility], [address], [place], [place] [address]    Report Status PENDING  Incomplete  Urine Culture     Status: Abnormal   Collection Time: 07/13/23  1:49 AM   Specimen: Urine, Clean Catch  Result Value Ref Range Status   Specimen Description   Final    URINE, CLEAN CATCH Performed at [facility], [address], [place], [place] [address]    Special Requests   Final    NONE Performed at [facility], [address], [place], [place] [address]    Culture 10,000 COLONIES/mL PSEUDOMONAS AERUGINOSA (A)  Final   Report Status 07/15/2023 FINAL  Final   Organism ID, Bacteria PSEUDOMONAS AERUGINOSA (A)  Final      Susceptibility   Pseudomonas aeruginosa - MIC*    CEFTAZIDIME 4 SENSITIVE Sensitive     CIPROFLOXACIN <=0.25 SENSITIVE Sensitive     GENTAMICIN <=1 SENSITIVE Sensitive     IMIPENEM 1 SENSITIVE Sensitive     PIP/TAZO 8 SENSITIVE Sensitive ug/mL    CEFEPIME 2 SENSITIVE Sensitive      * 10,000 COLONIES/mL PSEUDOMONAS AERUGINOSA  Blood culture (routine x 2)     Status: None (Preliminary result)   Collection Time: 07/13/23  1:50 AM   Specimen: BLOOD  Result Value Ref Range Status   Specimen Description BLOOD BLOOD RIGHT ARM  Final   Special Requests   Final    BOTTLES DRAWN AEROBIC AND ANAEROBIC Blood Culture adequate volume   Culture   Final    NO GROWTH 2 DAYS Performed at [facility], [address] [place], [place] [address]    Report Status PENDING  Incomplete     Time coordinating discharge:  I have spent 35 minutes face to face with the patient and on the ward discussing the patients care, assessment, plan and disposition with other care givers. >50% of the time was devoted counseling the patient about the risks and benefits of treatment/Discharge disposition and coordinating care.   SIGNED:   Miguel Rota, MD  [facility] 07/15/2023, 11:30 AM   If 7PM-7AM, please  contact night-coverage

## 2023-07-15 NOTE — Progress Notes (Signed)
PHARMACIST - PHYSICIAN COMMUNICATION  CONCERNING:  Enoxaparin (Lovenox) for DVT Prophylaxis   RECOMMENDATION: Patient was prescribed enoxaparin 40mg  q24 hours for VTE prophylaxis.   Filed Weights   07/12/23 1759 07/12/23 1803  Weight: (!) 137 kg (302 lb) (!) 137 kg (302 lb)   Body mass index is 45.92 kg/m.  Estimated Creatinine Clearance: 35.1 mL/min (A) (by C-G formula based on SCr of 2.65 mg/dL (H)).  Patient is candidate for enoxaparin 40 mg every 24 hours based on CrCl  > 30 mL/min  DESCRIPTION: Pharmacy has adjusted enoxaparin dose per [facility] policy.  Patient is now receiving enoxaparin 40 mg every 24 hours   Littie Deeds, PharmD Pharmacy Resident  07/15/2023 7:09 AM

## 2023-07-15 NOTE — Plan of Care (Signed)
  Problem: Fluid Volume: Goal: Hemodynamic stability will improve Outcome: Progressing   Problem: Clinical Measurements: Goal: Diagnostic test results will improve Outcome: Progressing   Problem: Respiratory: Goal: Ability to maintain adequate ventilation will improve Outcome: Progressing   Problem: Coping: Goal: Ability to adjust to condition or change in health will improve Outcome: Progressing   Problem: Fluid Volume: Goal: Ability to maintain a balanced intake and output will improve Outcome: Progressing   Problem: Health Behavior/Discharge Planning: Goal: Ability to identify and utilize available resources and services will improve Outcome: Progressing

## 2023-07-15 NOTE — Plan of Care (Signed)
  Problem: Fluid Volume: Goal: Ability to maintain a balanced intake and output will improve Outcome: Progressing   

## 2023-07-15 NOTE — Progress Notes (Addendum)
PHARMACY CONSULT NOTE - ELECTROLYTES  Pharmacy Consult for Electrolyte Monitoring and Replacement   Recent Labs: Height: 5\' 8"  (172.7 cm) Weight: (!) 137 kg (302 lb) IBW/kg (Calculated) : 68.4 Estimated Creatinine Clearance: 35.1 mL/min (A) (by C-G formula based on SCr of 2.65 mg/dL (H)). Potassium (mmol/L)  Date Value  07/15/2023 3.3 (L)   Magnesium (mg/dL)  Date Value  16/05/9603 1.8   Calcium (mg/dL)  Date Value  54/04/8118 8.9   Albumin (g/dL)  Date Value  14/78/2956 3.3 (L)  10/21/2020 3.8   Phosphorus (mg/dL)  Date Value  21/30/8657 4.4   Sodium (mmol/L)  Date Value  07/15/2023 131 (L)  10/21/2020 137   Corrected Ca: 10.7 mg/dL  Assessment  Jason Hudson is a 72 y.o. male presenting with sepsis secondary to recurrent infection. PMH significant for staph lugdunensis bacteremia and T5-T6 discitis and osteomyelitis initially diagnosed 04/12/2023 s/p treatment. Pharmacy has been consulted to monitor and replace electrolytes.  Diet: PO MIVF: LR @ 40 mL/hr scheduled to stop 11/23 AM Pertinent medications: spironolactone  Goal of Therapy: Electrolytes WNL  Plan:  Give KCl 40 mEq PO x 1 again today. Check BMP, Mg, Phos with AM labs  Thank you for allowing pharmacy to be a part of this patient's care.  Bettey Costa, PharmD Clinical Pharmacist 07/15/2023 8:07 AM

## 2023-07-17 ENCOUNTER — Telehealth: Payer: Self-pay | Admitting: *Deleted

## 2023-07-17 ENCOUNTER — Other Ambulatory Visit: Payer: Self-pay | Admitting: Nurse Practitioner

## 2023-07-17 DIAGNOSIS — M4644 Discitis, unspecified, thoracic region: Secondary | ICD-10-CM | POA: Diagnosis not present

## 2023-07-17 DIAGNOSIS — Z09 Encounter for follow-up examination after completed treatment for conditions other than malignant neoplasm: Secondary | ICD-10-CM | POA: Diagnosis not present

## 2023-07-17 NOTE — Transitions of Care (Post Inpatient/ED Visit) (Signed)
07/17/2023  Name: Jason Hudson MRN: [id number] DOB: 18-Sep-1950  Today's TOC FU Call Status: Today's TOC FU Call Status:: Successful TOC FU Call Completed TOC FU Call Complete Date: 07/17/23 Patient's Name and Date of Birth confirmed.  Transition Care Management Follow-up Telephone Call Date of Discharge: 07/15/23 Discharge Facility: [facility]) Type of Discharge: Inpatient Admission Primary Inpatient Discharge Diagnosis:: Discitis of thoracic region How have you been since you were released from the hospital?: Better Any questions or concerns?: No  Items Reviewed: Did you receive and understand the discharge instructions provided?: Yes Medications obtained,verified, and reconciled?: Yes (Medications Reviewed) Any new allergies since your discharge?: No Dietary orders reviewed?: No Do you have support at home?: Yes People in Home: significant other Name of Support/Comfort Primary Source: [employer]  Medications Reviewed Today: Medications Reviewed Today     Reviewed by Luella Cook, RN (Case Manager) on 07/17/23 at 1419  Med List Status: <None>   Medication Order Taking? Sig Documenting Provider Last Dose Status Informant  albuterol (VENTOLIN HFA) 108 (90 Base) MCG/ACT inhaler [id number] Yes Inhale 1-2 puffs into the lungs every 6 (six) hours as needed for wheezing or shortness of breath. Chesley Noon, MD Taking Active Self, Pharmacy Records  allopurinol (ZYLOPRIM) 100 MG tablet [id number] Yes TAKE ONE TABLET (100 MG) BY MOUTH ONCE DAILY Sallyanne Kuster, NP Taking Active Self, Pharmacy Records  amiodarone (PACERONE) 200 MG tablet [id number]  Take 1 tablet (200 mg total) by mouth daily. Darlin Drop, [place]  Expired 05/21/23 2359   aspirin EC 81 MG tablet [id number] No Take 81 mg by mouth daily. Swallow whole.  Patient not taking: Reported on 07/13/2023   [provider] Not Taking Active Self, Pharmacy Records   budesonide-formoterol [facility]) 160-4.5 MCG/ACT inhaler [id number] Yes Inhale 2 puffs into the lungs 2 (two) times daily. [provider] Taking Active Self, Pharmacy Records  colchicine 0.6 MG tablet [id number] Yes Take 0.6 mg by mouth daily. [provider] Taking Active Self  diphenhydrAMINE (BENADRYL) 25 mg capsule [id number] Yes Take 25 mg by mouth every 6 (six) hours as needed for itching or allergies. [provider] Taking Active Self, Pharmacy Records  ipratropium (ATROVENT) 0.02 % nebulizer solution [id number] Yes Take 2.5 mLs (0.5 mg total) by nebulization every 6 (six) hours as needed for wheezing or shortness of breath. Sallyanne Kuster, NP Taking Active Self, Pharmacy Records  isosorbide mononitrate (IMDUR) 30 MG 24 hr tablet [id number] Yes Take 30 mg by mouth daily. [provider] Taking Active Self, Pharmacy Records  levothyroxine (SYNTHROID) 112 MCG tablet [id number] Yes Take 112 mcg by mouth daily before breakfast. [provider] Taking Active Self, Pharmacy Records  loratadine (CLARITIN) 10 MG tablet [id number] Yes Take 10 mg by mouth daily.  [provider] Taking Active Self, Pharmacy Records           Med Note [facility], [facility] A   Wed Apr 12, 2023  8:41 PM)    multivitamin (ONE-A-DAY MEN'S) TABS tablet [id number] Yes Take 1 tablet by mouth daily. [provider] Taking Active Self, Pharmacy Records           Med Note Mayford Knife, Trudee Kuster S   Tue Feb 06, 2018  2:00 PM)    nitroGLYCERIN (NITROSTAT) 0.4 MG SL tablet [id number] Yes Place 1 tablet (0.4 mg total) under the tongue every 5 (five) minutes as needed for chest pain. Adrian Saran, MD Taking Active Self, Pharmacy Records  oxycodone (OXY-IR) 5 MG  capsule [id number] Yes Take 5 mg by mouth every 6 (six) hours as needed for pain. [provider] Taking Active Self  potassium chloride (KLOR-CON) 10 MEQ tablet [id number] Yes TAKE TWO TABLETS EVERY OTHER DAY AND 1 TABLET  ON THE DAYS IN BETWEEN  Patient taking differently: Take 20 mEq by mouth daily.   Sallyanne Kuster, NP Taking Active Self  spironolactone (ALDACTONE) 25 MG tablet [id number] Yes Take 25 mg by mouth daily. [provider] Taking Active Self  torsemide (DEMADEX) 20 MG tablet [id number] Yes Take 80 mg by mouth daily. [provider] Taking Active Self  TRESIBA FLEXTOUCH 100 UNIT/ML FlexTouch Pen [id number] Yes 120 Units 2 (two) times daily. [provider] Taking Active Self            Home Care and Equipment/Supplies: Were Home Health Services Ordered?: NA Any new equipment or medical supplies ordered?: NA  Functional Questionnaire: Do you need assistance with bathing/showering or dressing?: No Do you need assistance with meal preparation?: Yes Do you need assistance with eating?: No  Follow up appointments reviewed: PCP Follow-up appointment confirmed?: No MD Provider Line [phone] Given: Yes (Per patient he will set the appointment. His girlfriend is in the hospital now and he has to wait until she gets out.) Specialist Hospital Follow-up appointment confirmed?: Yes Date of Specialist follow-up appointment?: 07/25/23 Follow-Up Specialty Provider:: [id number] Dr Kolluru/ [id number] Dr Rivka Safer [id number] Dr Meredeth Ide Do you need transportation to your follow-up appointment?: No Do you understand care options if your condition(s) worsen?: Yes-patient verbalized understanding  SDOH Interventions Today    Flowsheet Row Most Recent Value  SDOH Interventions   Food Insecurity Interventions Intervention Not Indicated  Housing Interventions Intervention Not Indicated  Utilities Interventions Intervention Not Indicated     Patient requested 1 follow up outreach. RN sent for [id number] [facility].  RN discussed the importance of follow up appointment with PCP.   Gean Maidens BSN RN Population Health- Transition of Care Team.  [facility] [phone]

## 2023-07-18 LAB — CULTURE, BLOOD (ROUTINE X 2)
Culture: NO GROWTH
Culture: NO GROWTH
Special Requests: ADEQUATE
Special Requests: ADEQUATE

## 2023-07-25 DIAGNOSIS — E1122 Type 2 diabetes mellitus with diabetic chronic kidney disease: Secondary | ICD-10-CM | POA: Diagnosis not present

## 2023-07-25 DIAGNOSIS — I129 Hypertensive chronic kidney disease with stage 1 through stage 4 chronic kidney disease, or unspecified chronic kidney disease: Secondary | ICD-10-CM | POA: Diagnosis not present

## 2023-07-25 DIAGNOSIS — R809 Proteinuria, unspecified: Secondary | ICD-10-CM | POA: Diagnosis not present

## 2023-07-25 DIAGNOSIS — N179 Acute kidney failure, unspecified: Secondary | ICD-10-CM | POA: Diagnosis not present

## 2023-07-25 DIAGNOSIS — N184 Chronic kidney disease, stage 4 (severe): Secondary | ICD-10-CM | POA: Diagnosis not present

## 2023-07-25 DIAGNOSIS — N189 Chronic kidney disease, unspecified: Secondary | ICD-10-CM | POA: Diagnosis not present

## 2023-07-25 DIAGNOSIS — N2581 Secondary hyperparathyroidism of renal origin: Secondary | ICD-10-CM | POA: Diagnosis not present

## 2023-07-26 DIAGNOSIS — Z794 Long term (current) use of insulin: Secondary | ICD-10-CM | POA: Diagnosis not present

## 2023-07-26 DIAGNOSIS — H60502 Unspecified acute noninfective otitis externa, left ear: Secondary | ICD-10-CM | POA: Diagnosis not present

## 2023-07-26 DIAGNOSIS — I5022 Chronic systolic (congestive) heart failure: Secondary | ICD-10-CM | POA: Diagnosis not present

## 2023-07-26 DIAGNOSIS — M4620 Osteomyelitis of vertebra, site unspecified: Secondary | ICD-10-CM | POA: Diagnosis not present

## 2023-07-26 DIAGNOSIS — Z09 Encounter for follow-up examination after completed treatment for conditions other than malignant neoplasm: Secondary | ICD-10-CM | POA: Diagnosis not present

## 2023-07-26 DIAGNOSIS — E1165 Type 2 diabetes mellitus with hyperglycemia: Secondary | ICD-10-CM | POA: Diagnosis not present

## 2023-07-26 DIAGNOSIS — N184 Chronic kidney disease, stage 4 (severe): Secondary | ICD-10-CM | POA: Diagnosis not present

## 2023-07-26 DIAGNOSIS — M4644 Discitis, unspecified, thoracic region: Secondary | ICD-10-CM | POA: Diagnosis not present

## 2023-07-26 DIAGNOSIS — E1122 Type 2 diabetes mellitus with diabetic chronic kidney disease: Secondary | ICD-10-CM | POA: Diagnosis not present

## 2023-07-26 DIAGNOSIS — E039 Hypothyroidism, unspecified: Secondary | ICD-10-CM | POA: Diagnosis not present

## 2023-08-03 ENCOUNTER — Other Ambulatory Visit
Admission: RE | Admit: 2023-08-03 | Discharge: 2023-08-03 | Disposition: A | Discharge status: Home / Self Care | Payer: PPO | Source: Ambulatory Visit | Attending: Infectious Diseases | Admitting: Infectious Diseases

## 2023-08-03 ENCOUNTER — Ambulatory Visit: Payer: PPO | Attending: Infectious Diseases | Admitting: Infectious Diseases

## 2023-08-03 VITALS — BP 119/62 | HR 85 | Temp 97.7°F

## 2023-08-03 DIAGNOSIS — N186 End stage renal disease: Secondary | ICD-10-CM | POA: Diagnosis not present

## 2023-08-03 DIAGNOSIS — I509 Heart failure, unspecified: Secondary | ICD-10-CM | POA: Diagnosis not present

## 2023-08-03 DIAGNOSIS — I480 Paroxysmal atrial fibrillation: Secondary | ICD-10-CM | POA: Diagnosis not present

## 2023-08-03 DIAGNOSIS — M4626 Osteomyelitis of vertebra, lumbar region: Secondary | ICD-10-CM

## 2023-08-03 DIAGNOSIS — L089 Local infection of the skin and subcutaneous tissue, unspecified: Secondary | ICD-10-CM

## 2023-08-03 DIAGNOSIS — I132 Hypertensive heart and chronic kidney disease with heart failure and with stage 5 chronic kidney disease, or end stage renal disease: Secondary | ICD-10-CM | POA: Insufficient documentation

## 2023-08-03 DIAGNOSIS — M4646 Discitis, unspecified, lumbar region: Secondary | ICD-10-CM | POA: Diagnosis not present

## 2023-08-03 DIAGNOSIS — E1122 Type 2 diabetes mellitus with diabetic chronic kidney disease: Secondary | ICD-10-CM | POA: Diagnosis not present

## 2023-08-03 DIAGNOSIS — E669 Obesity, unspecified: Secondary | ICD-10-CM | POA: Diagnosis not present

## 2023-08-03 DIAGNOSIS — E1169 Type 2 diabetes mellitus with other specified complication: Secondary | ICD-10-CM | POA: Insufficient documentation

## 2023-08-03 DIAGNOSIS — B957 Other staphylococcus as the cause of diseases classified elsewhere: Secondary | ICD-10-CM | POA: Diagnosis not present

## 2023-08-03 DIAGNOSIS — R7881 Bacteremia: Secondary | ICD-10-CM | POA: Diagnosis not present

## 2023-08-03 DIAGNOSIS — D72829 Elevated white blood cell count, unspecified: Secondary | ICD-10-CM | POA: Diagnosis not present

## 2023-08-03 MED ORDER — MUPIROCIN 2 % EX OINT: 1.0000 | TOPICAL_OINTMENT | Freq: Two times a day (BID) | CUTANEOUS | 0 refills | Status: AC

## 2023-08-03 NOTE — Patient Instructions (Signed)
Today you are here for follow up after recent hospitalization You have a tender nodule on the back- took culture- apply mupirocin oint twice a day. Do not [pick on the nodule Will call you with the results of the culture

## 2023-08-03 NOTE — Progress Notes (Incomplete)
NAME: Jason Hudson  DOB: 02-14-1951  MRN: [id number]  Date/Time: 08/03/2023 11:39 PM   Subjective:    follow up visit after recent hospitalization in NOV for leucocytosis Seen by ID then and antibiotic not deemed necessary  Jason Hudson is a 72 y.o.male with ckd, PAF   staph lugdunensis bacteremia and Thoracic discitis was intially in hospital 8/21-8/30   he was discharged on Iv daptomycin ( adjusted to Crcl) and ceftriaxone for 6 weeks,  HE completed 38 days of IV dapto and ceftriaxone followed by 30 days of cefadroxil and doxy. He last saw me in my clinic on 11/5, and his pcp did labs as requested on 11/18 and it was wbc 23, CRP 26,ESR 84. HE was sent to [facility] and a MRI of thoracic spine showed worsening osteomyelitis-discitis at T5-T6, with progressive endplate erosions and edema and enhancement in bilateral T5-T6 and T6-T7 facets, concerning for septic arthritis. 2. Enhancing material at the right-greater-than-left lateral aspect of T5 and T6, concerning for phlegmon. A small amount of enhancing material is also noted at the ventral aspect of the thecal sac at T5-T7, concerning for epidural phlegmon, which does not cause significant spinal canal stenosis. Seen by ID dr.Vu who discussed with radiology and made an assessment that it was evolution of image and clincially he did not have any worsening infection- Pt was d/c on no antibiotics and he is seeing me today HE is doing okay. He doe snot have the mid thoracic bone pain he had in aug 2024  Past Medical History:  Diagnosis Date  . CHF (congestive heart failure) (HCC)   . COPD (chronic obstructive pulmonary disease) (HCC)   . Diabetes mellitus without complication (HCC)   . Edema    leg swelling  . Gastroesophageal reflux disease 12/15/2017  . Gout   . Hypertension   . Melena 11/05/2017  . Pneumonia 07/27/2016    Past Surgical History:  Procedure Laterality Date  . CENTRAL LINE INSERTION-TUNNELED N/A  04/21/2023   Procedure: CENTRAL LINE INSERTION-TUNNELED;  Surgeon: Gilda Crease Latina Craver, MD;  Location: [facility] INVASIVE CV LAB;  Service: Cardiovascular;  Laterality: N/A;  . COLONOSCOPY WITH PROPOFOL N/A 11/07/2017   Procedure: COLONOSCOPY WITH PROPOFOL;  Surgeon: Wyline Mood, MD;  Location: [facility] ENDOSCOPY;  Service: Gastroenterology;  Laterality: N/A;  . DIALYSIS/PERMA CATHETER REMOVAL N/A 05/26/2023   Procedure: DIALYSIS/PERMA CATHETER REMOVAL;  Surgeon: Annice Needy, MD;  Location: [facility] INVASIVE CV LAB;  Service: Cardiovascular;  Laterality: N/A;  . ESOPHAGOGASTRODUODENOSCOPY (EGD) WITH PROPOFOL N/A 11/07/2017   Procedure: ESOPHAGOGASTRODUODENOSCOPY (EGD) WITH PROPOFOL;  Surgeon: Wyline Mood, MD;  Location: [facility] ENDOSCOPY;  Service: Gastroenterology;  Laterality: N/A;  . LEFT HEART CATH AND CORONARY ANGIOGRAPHY N/A 10/23/2017   Procedure: LEFT HEART CATH AND CORONARY ANGIOGRAPHY;  Surgeon: Marcina Millard, MD;  Location: [facility] INVASIVE CV LAB;  Service: Cardiovascular;  Laterality: N/A;    Social History   Socioeconomic History  . Marital status: Single    Spouse name: Not on file  . Number of children: Not on file  . Years of education: Not on file  . Highest education level: Not on file  Occupational History  . Not on file  Tobacco Use  . Smoking status: Never  . Smokeless tobacco: Never  Substance and Sexual Activity  . Alcohol use: Not Currently    Comment: ocassionally  . Drug use: Never  . Sexual activity: Not Currently  Other Topics Concern  . Not on file  Social History Narrative  .  Not on file   Social Drivers of Health   Financial Resource Strain: Low Risk  (07/26/2023)   Received from [facility]   Overall Financial Resource Strain (CARDIA)   . Difficulty of Paying Living Expenses: Not hard at all  Food Insecurity: No Food Insecurity (07/26/2023)   Received from [facility]   Hunger Vital Sign   . Worried About Programme researcher, broadcasting/film/video  in the Last Year: Never true   . Ran Out of Food in the Last Year: Never true  Transportation Needs: No Transportation Needs (07/26/2023)   Received from [facility]   [facility] - Transportation   . In the past 12 months, has lack of transportation kept you from medical appointments or from getting medications?: No   . Lack of Transportation (Non-Medical): No  Physical Activity: Not on file  Stress: Not on file  Social Connections: Not on file  Intimate Partner Violence: Not At Risk (07/17/2023)   Humiliation, Afraid, Rape, and Kick questionnaire   . Fear of Current or Ex-Partner: No   . Emotionally Abused: No   . Physically Abused: No   . Sexually Abused: No    Family History  Problem Relation Age of Onset  . Cancer Mother   . CVA Father   . Heart disease Father    Allergies  Allergen Reactions  . Atorvastatin Other (See Comments)    Other reaction(s): Not available Other reaction(s): Other (See Comments)   . Red Dye #40 (Allura Red) Other (See Comments)    "not as allergic to it as I used to be. I can take a little of it"   I  Current Outpatient Medications  Medication Sig Dispense Refill  . albuterol (VENTOLIN HFA) 108 (90 Base) MCG/ACT inhaler Inhale 1-2 puffs into the lungs every 6 (six) hours as needed for wheezing or shortness of breath. 1 each 5  . allopurinol (ZYLOPRIM) 100 MG tablet TAKE ONE TABLET (100 MG) BY MOUTH ONCE DAILY 90 tablet 1  . aspirin EC 81 MG tablet Take 81 mg by mouth daily. Swallow whole.    . colchicine 0.6 MG tablet Take 0.6 mg by mouth daily.    . diphenhydrAMINE (BENADRYL) 25 mg capsule Take 25 mg by mouth every 6 (six) hours as needed for itching or allergies.    Marland Kitchen ipratropium (ATROVENT) 0.02 % nebulizer solution Take 2.5 mLs (0.5 mg total) by nebulization every 6 (six) hours as needed for wheezing or shortness of breath. 225 mL 1  . lactulose (CHRONULAC) 10 GM/15ML solution Take 10 g by mouth 2 (two) times daily as needed for  mild constipation or moderate constipation.    Marland Kitchen levothyroxine (SYNTHROID) 112 MCG tablet Take 112 mcg by mouth daily before breakfast.    . loratadine (CLARITIN) 10 MG tablet Take 10 mg by mouth daily.     . multivitamin (ONE-A-DAY MEN'S) TABS tablet Take 1 tablet by mouth daily.    . mupirocin ointment (BACTROBAN) 2 % Apply 1 Application topically 2 (two) times daily. Apply to the back BID 22 g 0  . nitroGLYCERIN (NITROSTAT) 0.4 MG SL tablet Place 1 tablet (0.4 mg total) under the tongue every 5 (five) minutes as needed for chest pain. 30 tablet 0  . oxycodone (OXY-IR) 5 MG capsule Take 5 mg by mouth every 6 (six) hours as needed for pain.    Marland Kitchen spironolactone (ALDACTONE) 25 MG tablet Take 25 mg by mouth daily.    Marland Kitchen  sucralfate (CARAFATE) 1 GM/10ML suspension SMARTSIG:By Mouth    . torsemide (DEMADEX) 20 MG tablet Take 80 mg by mouth daily.    Evaristo Bury FLEXTOUCH 100 UNIT/ML FlexTouch Pen 120 Units 2 (two) times daily.    Marland Kitchen amiodarone (PACERONE) 200 MG tablet Take 1 tablet (200 mg total) by mouth daily. 30 tablet 0  . budesonide-formoterol (SYMBICORT) 160-4.5 MCG/ACT inhaler Inhale 2 puffs into the lungs 2 (two) times daily. (Patient not taking: Reported on 08/03/2023)     No current facility-administered medications for this visit.   Facility-Administered Medications Ordered in Other Visits  Medication Dose Route Frequency Provider Last Rate Last Admin  . lidocaine-EPINEPHrine (PF) (XYLOCAINE-EPINEPHrine) 1 %-1:200000 (PF) injection    PRN Annice Needy, MD   20 mL at 05/26/23 1648     Abtx:  Anti-infectives (From admission, onward)    None       REVIEW OF SYSTEMS:  Const: negative fever, negative chills, negative weight loss Eyes: negative diplopia or visual changes, negative eye pain ENT: negative coryza, negative sore throat Resp: negative cough, hemoptysis, dyspnea Cards: negative for chest pain, palpitations, lower extremity edema GU: negative for frequency, dysuria and  hematuria GI: Negative for abdominal pain, diarrhea, bleeding, constipation Skin:  pruritus Heme: negative for easy bruising and gum/nose bleeding MS: weakness , legs wobbly Neurolo:negative for headaches, dizziness, vertigo, memory problems  Psych:  anxiety, depression  Endocrine:  diabetes Allergy/Immunology- atorvastatin  Objective:  VITALS:  BP 119/62   Pulse 85   Temp 97.7 F (36.5 C) (Temporal)    PHYSICAL EXAM:  General: Alert, cooperative, Head: Normocephalic, without obvious abnormality, atraumatic. Eyes: Conjunctivae clear, anicteric sclerae. Pupils are equal ENT Nares normal. No drainage or sinus tenderness. Lips, mucosa, and tongue normal. No Thrush Neck: Supple, symmetrical, no adenopathy, thyroid: non tender no carotid bruit and no JVD. Back: No CVA tenderness. Lungs: Clear to auscultation bilaterally. No Wheezing or Rhonchi. No rales. Heart: Regular rate and rhythm, no murmur, rub or gallop. Abdomen: did not examine Extremities: atraumatic, no cyanosis. No edema. No clubbing Skin: No rashes or lesions. Lymph: Cervical, supraclavicular normal. Neurologic: Grossly non-focal Pertinent Labs 10/4   Impression/Recommendation Staph lugdunensis bacteremia with T5-T6 discitis and osteomyelitis. Also has ventral epidural phlegmon and paraspinal phlegmon. No drainable fluid collection . Completed  IV daptomycin and ceftriaxone for 6 weeks, followed by a month of cefadroxil and Doxy- completed rx- doing well  CKD  PAF  CHF  Obesity  Hypercalcemia resolved  Discussed the management with patient and partner

## 2023-08-03 NOTE — Progress Notes (Signed)
NAME: Jason Hudson  DOB: [birth year]  MRN: [id number]  Date/Time: 08/03/2023 11:39 PM   Subjective:   ?follow up visit after recent hospitalization in NOV for leucocytosis   Corgan Bligh is a 72 y.o.male with ckd, PAF   staph lugdunensis bacteremia and Thoracic discitis was intially in hospital 8/21-8/30   he was discharged on Iv daptomycin ( adjusted to Crcl) and ceftriaxone for 6 weeks,  HE completed 38 days of IV dapto and ceftriaxone followed by 30 days of cefadroxil and doxy. He last saw me in my clinic on 11/5, and his pcp did labs as requested on 11/18 and it was wbc 23, CRP 26,ESR 84. HE was sent to [facility] and a MRI of thoracic spine showed worsening osteomyelitis-discitis at T5-T6, with progressive endplate erosions and edema and enhancement in bilateral T5-T6 and T6-T7 facets, concerning for septic arthritis. 2. Enhancing material at the right-greater-than-left lateral aspect of T5 and T6, concerning for phlegmon. A small amount of enhancing material is also noted at the ventral aspect of the thecal sac at T5-T7, concerning for epidural phlegmon, which does not cause significant spinal canal stenosis. Seen by ID [email or web address] who discussed with radiologist and made an assessment that it was evolution of image and clincially he did not have any worsening infection- Pt was d/c on no antibiotics and he is seeing me today HE is doing okay. He does not have the mid thoracic bone pain he had in aug 2024/ Has a small painful nodule    Past Medical History:  Diagnosis Date   CHF (congestive heart failure) (HCC)    COPD (chronic obstructive pulmonary disease) (HCC)    Diabetes mellitus without complication (HCC)    Edema    leg swelling   Gastroesophageal reflux disease 12/15/2017   Gout    Hypertension    Melena 11/05/2017   Pneumonia 07/27/2016    Past Surgical History:  Procedure Laterality Date   CENTRAL LINE INSERTION-TUNNELED N/A 04/21/2023   Procedure: CENTRAL  LINE INSERTION-TUNNELED;  Surgeon: Gilda Crease Latina Craver, MD;  Location: [facility] INVASIVE CV LAB;  Service: Cardiovascular;  Laterality: N/A;   COLONOSCOPY WITH PROPOFOL N/A 11/07/2017   Procedure: COLONOSCOPY WITH PROPOFOL;  Surgeon: Wyline Mood, MD;  Location: [facility] ENDOSCOPY;  Service: Gastroenterology;  Laterality: N/A;   DIALYSIS/PERMA CATHETER REMOVAL N/A 05/26/2023   Procedure: DIALYSIS/PERMA CATHETER REMOVAL;  Surgeon: Annice Needy, MD;  Location: [facility] INVASIVE CV LAB;  Service: Cardiovascular;  Laterality: N/A;   ESOPHAGOGASTRODUODENOSCOPY (EGD) WITH PROPOFOL N/A 11/07/2017   Procedure: ESOPHAGOGASTRODUODENOSCOPY (EGD) WITH PROPOFOL;  Surgeon: Wyline Mood, MD;  Location: [facility] ENDOSCOPY;  Service: Gastroenterology;  Laterality: N/A;   LEFT HEART CATH AND CORONARY ANGIOGRAPHY N/A 10/23/2017   Procedure: LEFT HEART CATH AND CORONARY ANGIOGRAPHY;  Surgeon: Marcina Millard, MD;  Location: [facility] INVASIVE CV LAB;  Service: Cardiovascular;  Laterality: N/A;    Social History   Socioeconomic History   Marital status: Single    Spouse name: Not on file   Number of children: Not on file   Years of education: Not on file   Highest education level: Not on file  Occupational History   Not on file  Tobacco Use   Smoking status: Never   Smokeless tobacco: Never  Substance and Sexual Activity   Alcohol use: Not Currently    Comment: ocassionally   Drug use: Never   Sexual activity: Not Currently  Other Topics Concern   Not on file  Social History Narrative   Not on  file   Social Drivers of Health   Financial Resource Strain: Low Risk  (07/26/2023)   Received from [facility]   Overall Financial Resource Strain (CARDIA)    Difficulty of Paying Living Expenses: Not hard at all  Food Insecurity: No Food Insecurity (07/26/2023)   Received from [facility]   Hunger Vital Sign    Worried About Running Out of Food in the Last Year: Never true    Ran Out of Food  in the Last Year: Never true  Transportation Needs: No Transportation Needs (07/26/2023)   Received from [facility] - Transportation    In the past 12 months, has lack of transportation kept you from medical appointments or from getting medications?: No    Lack of Transportation (Non-Medical): No  Physical Activity: Not on file  Stress: Not on file  Social Connections: Not on file  Intimate Partner Violence: Not At Risk (07/17/2023)   Humiliation, Afraid, Rape, and Kick questionnaire    Fear of Current or Ex-Partner: No    Emotionally Abused: No    Physically Abused: No    Sexually Abused: No    Family History  Problem Relation Age of Onset   Cancer Mother    CVA Father    Heart disease Father    Allergies  Allergen Reactions   Atorvastatin Other (See Comments)    Other reaction(s): Not available Other reaction(s): Other (See Comments)    Red Dye #40 (Allura Red) Other (See Comments)    "not as allergic to it as I used to be. I can take a little of it"   I? Current Outpatient Medications  Medication Sig Dispense Refill   albuterol (VENTOLIN HFA) 108 (90 Base) MCG/ACT inhaler Inhale 1-2 puffs into the lungs every 6 (six) hours as needed for wheezing or shortness of breath. 1 each 5   allopurinol (ZYLOPRIM) 100 MG tablet TAKE ONE TABLET (100 MG) BY MOUTH ONCE DAILY 90 tablet 1   aspirin EC 81 MG tablet Take 81 mg by mouth daily. Swallow whole.     colchicine 0.6 MG tablet Take 0.6 mg by mouth daily.     diphenhydrAMINE (BENADRYL) 25 mg capsule Take 25 mg by mouth every 6 (six) hours as needed for itching or allergies.     ipratropium (ATROVENT) 0.02 % nebulizer solution Take 2.5 mLs (0.5 mg total) by nebulization every 6 (six) hours as needed for wheezing or shortness of breath. 225 mL 1   lactulose (CHRONULAC) 10 GM/15ML solution Take 10 g by mouth 2 (two) times daily as needed for mild constipation or moderate constipation.     levothyroxine  (SYNTHROID) 112 MCG tablet Take 112 mcg by mouth daily before breakfast.     loratadine (CLARITIN) 10 MG tablet Take 10 mg by mouth daily.      multivitamin (ONE-A-DAY MEN'S) TABS tablet Take 1 tablet by mouth daily.     mupirocin ointment (BACTROBAN) 2 % Apply 1 Application topically 2 (two) times daily. Apply to the back BID 22 g 0   nitroGLYCERIN (NITROSTAT) 0.4 MG SL tablet Place 1 tablet (0.4 mg total) under the tongue every 5 (five) minutes as needed for chest pain. 30 tablet 0   oxycodone (OXY-IR) 5 MG capsule Take 5 mg by mouth every 6 (six) hours as needed for pain.     spironolactone (ALDACTONE) 25 MG tablet Take 25 mg by mouth daily.     sucralfate (CARAFATE) 1  GM/10ML suspension SMARTSIG:By Mouth     torsemide (DEMADEX) 20 MG tablet Take 80 mg by mouth daily.     TRESIBA FLEXTOUCH 100 UNIT/ML FlexTouch Pen 120 Units 2 (two) times daily.     amiodarone (PACERONE) 200 MG tablet Take 1 tablet (200 mg total) by mouth daily. 30 tablet 0   budesonide-formoterol (SYMBICORT) 160-4.5 MCG/ACT inhaler Inhale 2 puffs into the lungs 2 (two) times daily. (Patient not taking: Reported on 08/03/2023)     No current facility-administered medications for this visit.   Facility-Administered Medications Ordered in Other Visits  Medication Dose Route Frequency Provider Last Rate Last Admin   lidocaine-EPINEPHrine (PF) (XYLOCAINE-EPINEPHrine) 1 %-1:200000 (PF) injection    PRN Annice Needy, MD   20 mL at 05/26/23 1648     Abtx:  Anti-infectives (From admission, onward)    None       REVIEW OF SYSTEMS:  Const: negative fever, negative chills, negative weight loss Eyes: negative diplopia or visual changes, negative eye pain ENT: negative coryza, negative sore throat Resp: negative cough, hemoptysis, dyspnea Cards: negative for chest pain, palpitations, lower extremity edema GU: negative for frequency, dysuria and hematuria GI: Negative for abdominal pain, diarrhea, bleeding,  constipation Skin:  nodule  Heme: negative for easy bruising and gum/nose bleeding MS: weakness  Neurolo:negative for headaches, dizziness, vertigo, memory problems  Psych:  anxiety, depression  Endocrine:  diabetes Allergy/Immunology- atorvastatin  Objective:  VITALS:  BP 119/62   Pulse 85   Temp 97.7 F (36.5 C) (Temporal)    PHYSICAL EXAM:  General: Alert, cooperative, Head: Normocephalic, without obvious abnormality, atraumatic. Eyes: Conjunctivae clear, anicteric sclerae. Pupils are equal ENT Nares normal. No drainage or sinus tenderness. Lips, mucosa, and tongue normal. No Thrush Neck: Supple, symmetrical, no adenopathy, thyroid: non tender no carotid bruit and no JVD. Back: No CVA tenderness. Lungs: Clear to auscultation bilaterally. No Wheezing or Rhonchi. No rales. Heart: Regular rate and rhythm, no murmur, rub or gallop. Abdomen: did not examine Extremities: atraumatic, no cyanosis. No edema. No clubbing Skin: No rashes or lesions. Lymph: Cervical, supraclavicular normal. Neurologic: Grossly non-focal Pertinent Labs 10/4   Impression/Recommendation Staph lugdunensis bacteremia with T5-T6 discitis and osteomyelitis. Also has ventral epidural phlegmon and paraspinal phlegmon. Aug 2024 and Nov 2024 Completed  IV daptomycin and ceftriaxone for 6 weeks, followed by a month of cefadroxil and Doxy-   Persistent leucocytosis- will repeat labs today/ Pt is doing well and hence will not repeat MRI thoracic spine  Folliculitis back- culture  CKD  PAF  CHF  Obesity  Discussed the management with patient

## 2023-08-05 DIAGNOSIS — R0902 Hypoxemia: Secondary | ICD-10-CM | POA: Diagnosis not present

## 2023-08-07 LAB — AEROBIC CULTURE W GRAM STAIN (SUPERFICIAL SPECIMEN): Gram Stain: NONE SEEN

## 2023-08-08 DIAGNOSIS — E663 Overweight: Secondary | ICD-10-CM | POA: Diagnosis not present

## 2023-08-08 DIAGNOSIS — J4489 Other specified chronic obstructive pulmonary disease: Secondary | ICD-10-CM | POA: Diagnosis not present

## 2023-08-08 DIAGNOSIS — J301 Allergic rhinitis due to pollen: Secondary | ICD-10-CM | POA: Diagnosis not present

## 2023-08-08 DIAGNOSIS — G4733 Obstructive sleep apnea (adult) (pediatric): Secondary | ICD-10-CM | POA: Diagnosis not present

## 2023-08-08 DIAGNOSIS — R0609 Other forms of dyspnea: Secondary | ICD-10-CM | POA: Diagnosis not present

## 2023-08-09 ENCOUNTER — Other Ambulatory Visit: Payer: Self-pay | Admitting: Infectious Diseases

## 2023-08-09 MED ORDER — CEFADROXIL 500 MG PO CAPS: 500.0000 mg | ORAL_CAPSULE | Freq: Two times a day (BID) | ORAL | 0 refills | Status: AC

## 2023-08-09 NOTE — Progress Notes (Signed)
Staph lugdunensis on the skin infection culture- will send cefadroxil 500mg  BID -

## 2023-09-05 DIAGNOSIS — R0902 Hypoxemia: Secondary | ICD-10-CM | POA: Diagnosis not present

## 2023-09-27 DIAGNOSIS — R809 Proteinuria, unspecified: Secondary | ICD-10-CM | POA: Diagnosis not present

## 2023-09-27 DIAGNOSIS — N189 Chronic kidney disease, unspecified: Secondary | ICD-10-CM | POA: Diagnosis not present

## 2023-09-27 DIAGNOSIS — R600 Localized edema: Secondary | ICD-10-CM | POA: Diagnosis not present

## 2023-09-27 DIAGNOSIS — N184 Chronic kidney disease, stage 4 (severe): Secondary | ICD-10-CM | POA: Diagnosis not present

## 2023-09-27 DIAGNOSIS — N2581 Secondary hyperparathyroidism of renal origin: Secondary | ICD-10-CM | POA: Diagnosis not present

## 2023-09-27 DIAGNOSIS — I509 Heart failure, unspecified: Secondary | ICD-10-CM | POA: Diagnosis not present

## 2023-09-27 DIAGNOSIS — E1122 Type 2 diabetes mellitus with diabetic chronic kidney disease: Secondary | ICD-10-CM | POA: Diagnosis not present

## 2023-09-27 DIAGNOSIS — I129 Hypertensive chronic kidney disease with stage 1 through stage 4 chronic kidney disease, or unspecified chronic kidney disease: Secondary | ICD-10-CM | POA: Diagnosis not present

## 2023-10-04 DIAGNOSIS — E1122 Type 2 diabetes mellitus with diabetic chronic kidney disease: Secondary | ICD-10-CM | POA: Diagnosis not present

## 2023-10-04 DIAGNOSIS — Z794 Long term (current) use of insulin: Secondary | ICD-10-CM | POA: Diagnosis not present

## 2023-10-04 DIAGNOSIS — S91202A Unspecified open wound of left great toe with damage to nail, initial encounter: Secondary | ICD-10-CM | POA: Diagnosis not present

## 2023-10-04 DIAGNOSIS — N184 Chronic kidney disease, stage 4 (severe): Secondary | ICD-10-CM | POA: Diagnosis not present

## 2023-10-06 DIAGNOSIS — R0902 Hypoxemia: Secondary | ICD-10-CM | POA: Diagnosis not present

## 2023-10-09 DIAGNOSIS — L97811 Non-pressure chronic ulcer of other part of right lower leg limited to breakdown of skin: Secondary | ICD-10-CM | POA: Diagnosis not present

## 2023-10-09 DIAGNOSIS — Z794 Long term (current) use of insulin: Secondary | ICD-10-CM | POA: Diagnosis not present

## 2023-10-09 DIAGNOSIS — I872 Venous insufficiency (chronic) (peripheral): Secondary | ICD-10-CM | POA: Diagnosis not present

## 2023-10-09 DIAGNOSIS — E1165 Type 2 diabetes mellitus with hyperglycemia: Secondary | ICD-10-CM | POA: Diagnosis not present

## 2023-10-09 DIAGNOSIS — N184 Chronic kidney disease, stage 4 (severe): Secondary | ICD-10-CM | POA: Diagnosis not present

## 2023-10-13 DIAGNOSIS — M2041 Other hammer toe(s) (acquired), right foot: Secondary | ICD-10-CM | POA: Diagnosis not present

## 2023-10-13 DIAGNOSIS — E1142 Type 2 diabetes mellitus with diabetic polyneuropathy: Secondary | ICD-10-CM | POA: Diagnosis not present

## 2023-10-13 DIAGNOSIS — L97522 Non-pressure chronic ulcer of other part of left foot with fat layer exposed: Secondary | ICD-10-CM | POA: Diagnosis not present

## 2023-10-13 DIAGNOSIS — M2042 Other hammer toe(s) (acquired), left foot: Secondary | ICD-10-CM | POA: Diagnosis not present

## 2023-10-13 DIAGNOSIS — L84 Corns and callosities: Secondary | ICD-10-CM | POA: Diagnosis not present

## 2023-10-13 DIAGNOSIS — L601 Onycholysis: Secondary | ICD-10-CM | POA: Diagnosis not present

## 2023-10-13 DIAGNOSIS — B351 Tinea unguium: Secondary | ICD-10-CM | POA: Diagnosis not present

## 2023-10-23 DIAGNOSIS — E1122 Type 2 diabetes mellitus with diabetic chronic kidney disease: Secondary | ICD-10-CM | POA: Diagnosis not present

## 2023-10-23 DIAGNOSIS — N184 Chronic kidney disease, stage 4 (severe): Secondary | ICD-10-CM | POA: Diagnosis not present

## 2023-10-30 DIAGNOSIS — N184 Chronic kidney disease, stage 4 (severe): Secondary | ICD-10-CM | POA: Diagnosis not present

## 2023-10-30 DIAGNOSIS — I5032 Chronic diastolic (congestive) heart failure: Secondary | ICD-10-CM | POA: Diagnosis not present

## 2023-10-30 DIAGNOSIS — Z6841 Body Mass Index (BMI) 40.0 and over, adult: Secondary | ICD-10-CM | POA: Diagnosis not present

## 2023-10-30 DIAGNOSIS — R600 Localized edema: Secondary | ICD-10-CM | POA: Diagnosis not present

## 2023-10-30 DIAGNOSIS — I509 Heart failure, unspecified: Secondary | ICD-10-CM | POA: Diagnosis not present

## 2023-10-30 DIAGNOSIS — E119 Type 2 diabetes mellitus without complications: Secondary | ICD-10-CM | POA: Diagnosis not present

## 2023-10-30 DIAGNOSIS — I480 Paroxysmal atrial fibrillation: Secondary | ICD-10-CM | POA: Diagnosis not present

## 2023-10-30 DIAGNOSIS — I2510 Atherosclerotic heart disease of native coronary artery without angina pectoris: Secondary | ICD-10-CM | POA: Diagnosis not present

## 2023-10-30 DIAGNOSIS — I10 Essential (primary) hypertension: Secondary | ICD-10-CM | POA: Diagnosis not present

## 2023-10-30 DIAGNOSIS — Z794 Long term (current) use of insulin: Secondary | ICD-10-CM | POA: Diagnosis not present

## 2023-10-30 DIAGNOSIS — J301 Allergic rhinitis due to pollen: Secondary | ICD-10-CM | POA: Diagnosis not present

## 2023-10-30 DIAGNOSIS — J4489 Other specified chronic obstructive pulmonary disease: Secondary | ICD-10-CM | POA: Diagnosis not present

## 2023-10-30 DIAGNOSIS — G4733 Obstructive sleep apnea (adult) (pediatric): Secondary | ICD-10-CM | POA: Diagnosis not present

## 2023-10-30 DIAGNOSIS — E66813 Obesity, class 3: Secondary | ICD-10-CM | POA: Diagnosis not present

## 2023-11-03 DIAGNOSIS — R0902 Hypoxemia: Secondary | ICD-10-CM | POA: Diagnosis not present

## 2023-11-10 DIAGNOSIS — E1142 Type 2 diabetes mellitus with diabetic polyneuropathy: Secondary | ICD-10-CM | POA: Diagnosis not present

## 2023-11-10 DIAGNOSIS — B351 Tinea unguium: Secondary | ICD-10-CM | POA: Diagnosis not present

## 2023-11-10 DIAGNOSIS — I739 Peripheral vascular disease, unspecified: Secondary | ICD-10-CM | POA: Diagnosis not present

## 2023-11-10 DIAGNOSIS — L97522 Non-pressure chronic ulcer of other part of left foot with fat layer exposed: Secondary | ICD-10-CM | POA: Diagnosis not present

## 2023-11-10 DIAGNOSIS — I890 Lymphedema, not elsewhere classified: Secondary | ICD-10-CM | POA: Diagnosis not present

## 2023-11-10 DIAGNOSIS — M2042 Other hammer toe(s) (acquired), left foot: Secondary | ICD-10-CM | POA: Diagnosis not present

## 2023-11-10 DIAGNOSIS — M2041 Other hammer toe(s) (acquired), right foot: Secondary | ICD-10-CM | POA: Diagnosis not present

## 2023-11-21 DIAGNOSIS — I5189 Other ill-defined heart diseases: Secondary | ICD-10-CM | POA: Diagnosis not present

## 2023-11-21 DIAGNOSIS — N184 Chronic kidney disease, stage 4 (severe): Secondary | ICD-10-CM | POA: Diagnosis not present

## 2023-11-21 DIAGNOSIS — N060 Isolated proteinuria with minor glomerular abnormality: Secondary | ICD-10-CM | POA: Diagnosis not present

## 2023-12-04 DIAGNOSIS — R0902 Hypoxemia: Secondary | ICD-10-CM | POA: Diagnosis not present

## 2023-12-27 DIAGNOSIS — I129 Hypertensive chronic kidney disease with stage 1 through stage 4 chronic kidney disease, or unspecified chronic kidney disease: Secondary | ICD-10-CM | POA: Diagnosis not present

## 2023-12-27 DIAGNOSIS — I509 Heart failure, unspecified: Secondary | ICD-10-CM | POA: Diagnosis not present

## 2023-12-27 DIAGNOSIS — R809 Proteinuria, unspecified: Secondary | ICD-10-CM | POA: Diagnosis not present

## 2023-12-27 DIAGNOSIS — N184 Chronic kidney disease, stage 4 (severe): Secondary | ICD-10-CM | POA: Diagnosis not present

## 2023-12-27 DIAGNOSIS — N2581 Secondary hyperparathyroidism of renal origin: Secondary | ICD-10-CM | POA: Diagnosis not present

## 2024-01-02 ENCOUNTER — Other Ambulatory Visit (INDEPENDENT_AMBULATORY_CARE_PROVIDER_SITE_OTHER): Payer: Self-pay | Admitting: Nurse Practitioner

## 2024-01-02 DIAGNOSIS — I739 Peripheral vascular disease, unspecified: Secondary | ICD-10-CM

## 2024-01-03 ENCOUNTER — Ambulatory Visit (INDEPENDENT_AMBULATORY_CARE_PROVIDER_SITE_OTHER)

## 2024-01-03 ENCOUNTER — Encounter (INDEPENDENT_AMBULATORY_CARE_PROVIDER_SITE_OTHER): Payer: Self-pay | Admitting: Nurse Practitioner

## 2024-01-03 ENCOUNTER — Ambulatory Visit (INDEPENDENT_AMBULATORY_CARE_PROVIDER_SITE_OTHER): Admitting: Nurse Practitioner

## 2024-01-03 VITALS — BP 128/79 | HR 79 | Ht 68.0 in | Wt 302.0 lb

## 2024-01-03 DIAGNOSIS — I739 Peripheral vascular disease, unspecified: Secondary | ICD-10-CM | POA: Diagnosis not present

## 2024-01-03 DIAGNOSIS — I10 Essential (primary) hypertension: Secondary | ICD-10-CM | POA: Diagnosis not present

## 2024-01-03 DIAGNOSIS — R0902 Hypoxemia: Secondary | ICD-10-CM | POA: Diagnosis not present

## 2024-01-03 DIAGNOSIS — I5032 Chronic diastolic (congestive) heart failure: Secondary | ICD-10-CM

## 2024-01-03 DIAGNOSIS — S91109A Unspecified open wound of unspecified toe(s) without damage to nail, initial encounter: Secondary | ICD-10-CM

## 2024-01-03 DIAGNOSIS — R600 Localized edema: Secondary | ICD-10-CM

## 2024-01-08 LAB — VAS US ABI WITH/WO TBI
Left ABI: >1
Right ABI: >1

## 2024-01-08 NOTE — Progress Notes (Signed)
 Subjective:    Patient ID: Jason Hudson, male    DOB: Aug 11, 1951, 73 y.o.   MRN: [id number] Chief Complaint  Patient presents with   New Patient (Initial Visit)    np. ABI + consult. PVD. baker, andrew.    The patient is a 73 year old male who presents today as a referral from Dr. Larey Plenty with concern for possible peripheral arterial disease.  He had a toe wound that was slow to heal with decreased pulses.  The toe wound is improving but he notes that he has significant lower extremity edema.  He has noncompressible ABIs bilaterally with normal TBI's bilaterally.  He has strong triphasic waveforms and normal toe waveforms bilaterally.  He has previously tried to wear compression but not been successful.  Currently he has no evidence of venous ulcerations.    Review of Systems  Cardiovascular:  Positive for leg swelling.  Skin:  Positive for wound.  All other systems reviewed and are negative.      Objective:    Physical Exam Vitals reviewed.  HENT:     Head: Normocephalic.  Cardiovascular:     Rate and Rhythm: Normal rate.  Pulmonary:     Effort: Pulmonary effort is normal.  Musculoskeletal:     Right lower leg: 2+ Edema present.     Left lower leg: 2+ Edema present.  Skin:    General: Skin is warm and dry.  Neurological:     Mental Status: He is alert and oriented to person, place, and time.  Psychiatric:        Mood and Affect: Mood normal.        Behavior: Behavior normal.        Thought Content: Thought content normal.        Judgment: Judgment normal.     BP 128/79   Pulse 79   Ht 5\' 8"  (1.727 m)   Wt (!) 302 lb (137 kg)   BMI 45.92 kg/m   Past Medical History:  Diagnosis Date   CHF (congestive heart failure) (HCC)    COPD (chronic obstructive pulmonary disease) (HCC)    Diabetes mellitus without complication (HCC)    Edema    leg swelling   Gastroesophageal reflux disease 12/15/2017   Gout    Hypertension    Melena 11/05/2017   Pneumonia  07/27/2016    Social History   Socioeconomic History   Marital status: Single    Spouse name: Not on file   Number of children: Not on file   Years of education: Not on file   Highest education level: Not on file  Occupational History   Not on file  Tobacco Use   Smoking status: Never   Smokeless tobacco: Never  Substance and Sexual Activity   Alcohol use: Not Currently    Comment: ocassionally   Drug use: Never   Sexual activity: Not Currently  Other Topics Concern   Not on file  Social History Narrative   Not on file   Social Drivers of Health   Financial Resource Strain: Low Risk  (10/30/2023)   Received [facility]   Overall Financial Resource Strain (CARDIA)    Difficulty of Paying Living Expenses: Not hard at all  Food Insecurity: No Food Insecurity (10/30/2023)   Received from [facility]   Hunger Vital Sign    Worried About Running Out of Food in the Last Year: Never true    Ran Out of Food in the  Last Year: Never true  Transportation Needs: No Transportation Needs (10/30/2023)   Received from [facility] - Transportation    In the past 12 months, has lack of transportation kept you from medical appointments or from getting medications?: No    Lack of Transportation (Non-Medical): No  Physical Activity: Not on file  Stress: Not on file  Social Connections: Not on file  Intimate Partner Violence: Not At Risk (07/17/2023)   Humiliation, Afraid, Rape, and Kick questionnaire    Fear of Current or Ex-Partner: No    Emotionally Abused: No    Physically Abused: No    Sexually Abused: No    Past Surgical History:  Procedure Laterality Date   CENTRAL LINE INSERTION-TUNNELED N/A 04/21/2023   Procedure: CENTRAL LINE INSERTION-TUNNELED;  Surgeon: Prescilla Brod, Ninette Basque, MD;  Location: [facility] INVASIVE CV LAB;  Service: Cardiovascular;  Laterality: N/A;   COLONOSCOPY WITH PROPOFOL  N/A 11/07/2017   Procedure:  COLONOSCOPY WITH PROPOFOL ;  Surgeon: Luke Salaam, MD;  Location: [facility] ENDOSCOPY;  Service: Gastroenterology;  Laterality: N/A;   DIALYSIS/PERMA CATHETER REMOVAL N/A 05/26/2023   Procedure: DIALYSIS/PERMA CATHETER REMOVAL;  Surgeon: Celso College, MD;  Location: [facility] INVASIVE CV LAB;  Service: Cardiovascular;  Laterality: N/A;   ESOPHAGOGASTRODUODENOSCOPY (EGD) WITH PROPOFOL  N/A 11/07/2017   Procedure: ESOPHAGOGASTRODUODENOSCOPY (EGD) WITH PROPOFOL ;  Surgeon: Luke Salaam, MD;  Location: [facility] ENDOSCOPY;  Service: Gastroenterology;  Laterality: N/A;   LEFT HEART CATH AND CORONARY ANGIOGRAPHY N/A 10/23/2017   Procedure: LEFT HEART CATH AND CORONARY ANGIOGRAPHY;  Surgeon: Percival Brace, MD;  Location: [facility] INVASIVE CV LAB;  Service: Cardiovascular;  Laterality: N/A;    Family History  Problem Relation Age of Onset   Cancer Mother    CVA Father    Heart disease Father     Allergies  Allergen Reactions   Atorvastatin  Other (See Comments)    Other reaction(s): Not available Other reaction(s): Other (See Comments)    Red Dye #17 (Toney Red) Other (See Comments)   Red Dye #40 (Allura Red) Other (See Comments)    "not as allergic to it as I used to be. I can take a little of it"       Latest Ref Rng & Units 07/15/2023    4:25 AM 07/14/2023    4:29 AM 07/13/2023    4:20 AM  CBC  WBC 4.0 - 10.5 K/uL 17.3  17.4  15.8   Hemoglobin 13.0 - 17.0 g/dL 16.1  09.6  04.5   Hematocrit 39.0 - 52.0 % 35.5  33.1  33.5   Platelets 150 - 400 K/uL 290  272  282        CMP     Component Value Date/Time   NA 131 (L) 07/15/2023 0425   NA 137 10/21/2020 1102   K 3.3 (L) 07/15/2023 0425   CL 91 (L) 07/15/2023 0425   CO2 28 07/15/2023 0425   GLUCOSE 289 (H) 07/15/2023 0425   BUN 66 (H) 07/15/2023 0425   BUN 35 (H) 10/21/2020 1102   CREATININE 2.65 (H) 07/15/2023 0425   CALCIUM  8.9 07/15/2023 0425   PROT 7.3 05/25/2023 0631   PROT 7.0 10/21/2020 1102   ALBUMIN  3.3 (L) 05/25/2023 0631    ALBUMIN  3.8 10/21/2020 1102   AST 112 (H) 05/25/2023 0631   ALT 76 (H) 05/25/2023 0631   ALKPHOS 90 05/25/2023 0631   BILITOT 0.4 05/25/2023 0631   BILITOT 0.3 10/21/2020 1102   EGFR 29 (L) 10/21/2020 1102  GFRNONAA 25 (L) 07/15/2023 0425     No results found.     Assessment & Plan:   1. Lower extremity edema (Primary) No surgery or intervention at this point in time.    I have had a long discussion with the patient regarding venous insufficiency and why it  causes symptoms, specifically venous ulceration. I have discussed with the patient the chronic skin changes that accompany venous insufficiency and the long term sequela such as infection and recurring  ulceration.  Patient will be placed in [employer] which will be changed weekly drainage permitting.  In addition, behavioral modification including several periods of elevation of the lower extremities during the day will be continued. Achieving a position with the ankles at heart level was stressed to the patient  The patient is instructed to begin routine exercise, especially walking on a daily basis  In the future the patient can be assessed for graduated compression stockings or wraps as well as a Lymph Pump once the ulcers are healed.  2. Chronic diastolic congestive heart failure (HCC) This could also contribute to the patient's lower extremity edema  3. Essential hypertension Continue antihypertensive medications as already ordered, these medications have been reviewed and there are no changes at this time.  4. Open wound of toe, initial encounter Wounds appear to be improving.  He will continue to follow with podiatry.  Based on noninvasive studies he should have adequate perfusion for healing.   Current Outpatient Medications on File Prior to Visit  Medication Sig Dispense Refill   albuterol  (VENTOLIN  HFA) 108 (90 Base) MCG/ACT inhaler Inhale 1-2 puffs into the lungs every 6 (six) hours as needed for wheezing or  shortness of breath. 1 each 5   allopurinol  (ZYLOPRIM ) 100 MG tablet TAKE ONE TABLET (100 MG) BY MOUTH ONCE DAILY 90 tablet 1   amiodarone  (PACERONE ) 200 MG tablet Take 1 tablet (200 mg total) by mouth daily. 30 tablet 0   aspirin  EC 81 MG tablet Take 81 mg by mouth daily. Swallow whole.     budesonide -formoterol  (SYMBICORT ) 160-4.5 MCG/ACT inhaler Inhale 2 puffs into the lungs 2 (two) times daily.     colchicine  0.6 MG tablet Take 0.6 mg by mouth daily.     Continuous Glucose Sensor (FREESTYLE LIBRE 3 PLUS SENSOR) MISC      diphenhydrAMINE  (BENADRYL ) 25 mg capsule Take 25 mg by mouth every 6 (six) hours as needed for itching or allergies.     doxycycline  (VIBRA -TABS) 100 MG tablet Take 100 mg by mouth 2 (two) times daily.     glimepiride (AMARYL) 1 MG tablet Take 1 mg by mouth daily with breakfast.     insulin  glargine, 2 Unit Dial, (TOUJEO  MAX) 300 UNIT/ML Solostar Pen Inject 80 Units into the skin.     ipratropium (ATROVENT ) 0.02 % nebulizer solution Take 2.5 mLs (0.5 mg total) by nebulization every 6 (six) hours as needed for wheezing or shortness of breath. 225 mL 1   JANUVIA 50 MG tablet Take 50 mg by mouth.     lactulose (CHRONULAC) 10 GM/15ML solution Take 10 g by mouth 2 (two) times daily as needed for mild constipation or moderate constipation.     lactulose, encephalopathy, (CHRONULAC) 10 GM/15ML SOLN Take 15 mLs by mouth.     levothyroxine  (SYNTHROID ) 112 MCG tablet Take 112 mcg by mouth daily before breakfast.     loratadine  (CLARITIN ) 10 MG tablet Take 10 mg by mouth daily.      multivitamin (ONE-A-DAY  MEN'S) TABS tablet Take 1 tablet by mouth daily.     nitroGLYCERIN  (NITROSTAT ) 0.4 MG SL tablet Place 1 tablet (0.4 mg total) under the tongue every 5 (five) minutes as needed for chest pain. 30 tablet 0   oxycodone  (OXY-IR) 5 MG capsule Take 5 mg by mouth every 6 (six) hours as needed for pain.     spironolactone  (ALDACTONE ) 25 MG tablet Take 25 mg by mouth daily.     torsemide   (DEMADEX ) 20 MG tablet Take 80 mg by mouth daily.     cefadroxil  (DURICEF) 500 MG capsule Take 1 capsule (500 mg total) by mouth 2 (two) times daily. (Patient not taking: Reported on 01/03/2024) 60 capsule 0   mupirocin  ointment (BACTROBAN ) 2 % Apply 1 Application topically 2 (two) times daily. Apply to the back BID (Patient not taking: Reported on 01/03/2024) 22 g 0   sucralfate  (CARAFATE ) 1 GM/10ML suspension SMARTSIG:By Mouth (Patient not taking: Reported on 01/03/2024)     TRESIBA  FLEXTOUCH 100 UNIT/ML FlexTouch Pen 120 Units 2 (two) times daily. (Patient not taking: Reported on 01/03/2024)     Current Facility-Administered Medications on File Prior to Visit  Medication Dose Route Frequency Provider Last Rate Last Admin   lidocaine -EPINEPHrine  (PF) (XYLOCAINE -EPINEPHrine ) 1 %-1:200000 (PF) injection    PRN Dew, Jason S, MD   20 mL at 05/26/23 1648    There are no Patient Instructions on file for this visit. No follow-ups on file.   Doneen Ollinger E Detrick Dani, NP

## 2024-01-09 ENCOUNTER — Encounter (INDEPENDENT_AMBULATORY_CARE_PROVIDER_SITE_OTHER): Payer: Self-pay

## 2024-01-10 ENCOUNTER — Ambulatory Visit (INDEPENDENT_AMBULATORY_CARE_PROVIDER_SITE_OTHER): Admitting: Nurse Practitioner

## 2024-01-10 ENCOUNTER — Other Ambulatory Visit (INDEPENDENT_AMBULATORY_CARE_PROVIDER_SITE_OTHER): Payer: Self-pay | Admitting: Nurse Practitioner

## 2024-01-10 ENCOUNTER — Encounter (INDEPENDENT_AMBULATORY_CARE_PROVIDER_SITE_OTHER): Payer: Self-pay | Admitting: Nurse Practitioner

## 2024-01-10 VITALS — BP 129/77 | HR 80 | Resp 18

## 2024-01-10 DIAGNOSIS — E1142 Type 2 diabetes mellitus with diabetic polyneuropathy: Secondary | ICD-10-CM | POA: Diagnosis not present

## 2024-01-10 DIAGNOSIS — I890 Lymphedema, not elsewhere classified: Secondary | ICD-10-CM | POA: Diagnosis not present

## 2024-01-10 DIAGNOSIS — M2042 Other hammer toe(s) (acquired), left foot: Secondary | ICD-10-CM | POA: Diagnosis not present

## 2024-01-10 DIAGNOSIS — B351 Tinea unguium: Secondary | ICD-10-CM | POA: Diagnosis not present

## 2024-01-10 DIAGNOSIS — I739 Peripheral vascular disease, unspecified: Secondary | ICD-10-CM | POA: Diagnosis not present

## 2024-01-10 DIAGNOSIS — L97522 Non-pressure chronic ulcer of other part of left foot with fat layer exposed: Secondary | ICD-10-CM | POA: Diagnosis not present

## 2024-01-10 DIAGNOSIS — M2041 Other hammer toe(s) (acquired), right foot: Secondary | ICD-10-CM | POA: Diagnosis not present

## 2024-01-10 DIAGNOSIS — Z7689 Persons encountering health services in other specified circumstances: Secondary | ICD-10-CM | POA: Diagnosis not present

## 2024-01-10 NOTE — Progress Notes (Unsigned)
 History of Present Illness  There is no documented history at this time  Assessments & Plan   There are no diagnoses linked to this encounter.    Additional instructions  Subjective:  Patient presents with venous ulcer of the Bilateral lower extremity.    Procedure:  3 layer unna wrap was placed Bilateral lower extremity.   Plan:   Follow up in one week.

## 2024-01-15 LAB — AEROBIC CULTURE

## 2024-01-17 ENCOUNTER — Encounter (INDEPENDENT_AMBULATORY_CARE_PROVIDER_SITE_OTHER): Payer: Self-pay

## 2024-01-17 ENCOUNTER — Ambulatory Visit (INDEPENDENT_AMBULATORY_CARE_PROVIDER_SITE_OTHER): Admitting: Nurse Practitioner

## 2024-01-17 VITALS — BP 107/70 | HR 85 | Resp 16

## 2024-01-17 DIAGNOSIS — R600 Localized edema: Secondary | ICD-10-CM | POA: Diagnosis not present

## 2024-01-17 NOTE — Progress Notes (Unsigned)
 History of Present Illness  There is no documented history at this time  Assessments & Plan   There are no diagnoses linked to this encounter.    Additional instructions  Subjective:  Patient presents with venous ulcer of the Bilateral lower extremity.    Procedure:  3 layer unna wrap was placed Bilateral lower extremity.   Plan:   Follow up in one week.

## 2024-01-18 ENCOUNTER — Encounter (INDEPENDENT_AMBULATORY_CARE_PROVIDER_SITE_OTHER): Payer: Self-pay | Admitting: Nurse Practitioner

## 2024-01-24 ENCOUNTER — Ambulatory Visit (INDEPENDENT_AMBULATORY_CARE_PROVIDER_SITE_OTHER): Admitting: Nurse Practitioner

## 2024-01-24 ENCOUNTER — Encounter (INDEPENDENT_AMBULATORY_CARE_PROVIDER_SITE_OTHER): Payer: Self-pay

## 2024-01-24 VITALS — BP 128/78 | HR 92 | Resp 18 | Ht 68.0 in | Wt 300.0 lb

## 2024-01-24 DIAGNOSIS — R600 Localized edema: Secondary | ICD-10-CM | POA: Diagnosis not present

## 2024-01-24 NOTE — Progress Notes (Signed)
 History of Present Illness  There is no documented history at this time  Assessments & Plan   There are no diagnoses linked to this encounter.    Additional instructions  Subjective:  Patient presents with venous ulcer of the Bilateral lower extremity.    Procedure:  3 layer unna wrap was placed Bilateral lower extremity.   Plan:   Follow up in one week.

## 2024-01-29 DIAGNOSIS — E1142 Type 2 diabetes mellitus with diabetic polyneuropathy: Secondary | ICD-10-CM | POA: Diagnosis not present

## 2024-01-29 DIAGNOSIS — L97522 Non-pressure chronic ulcer of other part of left foot with fat layer exposed: Secondary | ICD-10-CM | POA: Diagnosis not present

## 2024-01-29 DIAGNOSIS — I739 Peripheral vascular disease, unspecified: Secondary | ICD-10-CM | POA: Diagnosis not present

## 2024-01-31 ENCOUNTER — Ambulatory Visit (INDEPENDENT_AMBULATORY_CARE_PROVIDER_SITE_OTHER): Admitting: Vascular Surgery

## 2024-02-01 ENCOUNTER — Ambulatory Visit (INDEPENDENT_AMBULATORY_CARE_PROVIDER_SITE_OTHER): Admitting: Vascular Surgery

## 2024-02-01 ENCOUNTER — Encounter (INDEPENDENT_AMBULATORY_CARE_PROVIDER_SITE_OTHER): Payer: Self-pay | Admitting: Vascular Surgery

## 2024-02-01 VITALS — BP 133/81 | HR 82 | Resp 18 | Ht 68.0 in | Wt 297.8 lb

## 2024-02-01 DIAGNOSIS — I10 Essential (primary) hypertension: Secondary | ICD-10-CM | POA: Diagnosis not present

## 2024-02-01 DIAGNOSIS — I890 Lymphedema, not elsewhere classified: Secondary | ICD-10-CM | POA: Diagnosis not present

## 2024-02-01 DIAGNOSIS — I5032 Chronic diastolic (congestive) heart failure: Secondary | ICD-10-CM | POA: Diagnosis not present

## 2024-02-01 NOTE — Progress Notes (Signed)
 Subjective:    Patient ID: Jason Hudson, male    DOB: 1951-03-25, 73 y.o.   MRN: [id number] Chief Complaint  Patient presents with   Follow-up    Follow up Unna boot x4    The patient is a 73 year old male who presents today with chief complaint of bilateral lower extremity leg swelling.  He had a toe wound that was slow to heal with decreased pulses that is now healed.  The toe wound is well healed but he notes that he has recurrent significant lower extremity edema.  He has noncompressible ABIs bilaterally with normal TBI's bilaterally from previous ultrasound.  He has strong triphasic waveforms and normal toe waveforms bilaterally.   He has previously tried to wear compression but not been successful.  Currently he has no evidence of venous ulcerations.     Review of Systems  Constitutional: Negative.   Cardiovascular:  Positive for leg swelling.  Skin:  Positive for color change.  All other systems reviewed and are negative.      Objective:   Physical Exam Constitutional:      Appearance: Normal appearance. He is obese.  HENT:     Head: Normocephalic.   Eyes:     Pupils: Pupils are equal, round, and reactive to light.    Cardiovascular:     Rate and Rhythm: Normal rate and regular rhythm.     Pulses: Normal pulses.     Heart sounds: Normal heart sounds.  Pulmonary:     Effort: Pulmonary effort is normal.     Breath sounds: Normal breath sounds.  Abdominal:     General: Abdomen is flat.     Palpations: Abdomen is soft.   Musculoskeletal:        General: Swelling present.     Right lower leg: Edema present.     Left lower leg: Edema present.   Skin:    General: Skin is warm and dry.     Capillary Refill: Capillary refill takes more than 3 seconds.     Findings: Erythema present.   Neurological:     General: No focal deficit present.     Mental Status: He is alert and oriented to person, place, and time. Mental status is at baseline.    Psychiatric:        Mood and Affect: Mood normal.        Behavior: Behavior normal.        Thought Content: Thought content normal.        Judgment: Judgment normal.     BP 133/81 (BP Location: Right Leg, Patient Position: Sitting, Cuff Size: Large)   Pulse 82   Resp 18   Ht 5' 8 (1.727 m)   Wt 297 lb 12.8 oz (135.1 kg)   BMI 45.28 kg/m   Past Medical History:  Diagnosis Date   CHF (congestive heart failure) (HCC)    COPD (chronic obstructive pulmonary disease) (HCC)    Diabetes mellitus without complication (HCC)    Edema    leg swelling   Gastroesophageal reflux disease 12/15/2017   Gout    Hypertension    Melena 11/05/2017   Pneumonia 07/27/2016    Social History   Socioeconomic History   Marital status: Single    Spouse name: Not on file   Number of children: Not on file   Years of education: Not on file   Highest education level: Not on file  Occupational History   Not on file  Tobacco Use  Smoking status: Never   Smokeless tobacco: Never  Substance and Sexual Activity   Alcohol use: Not Currently    Comment: ocassionally   Drug use: Never   Sexual activity: Not Currently  Other Topics Concern   Not on file  Social History Narrative   Not on file   Social Drivers of Health   Financial Resource Strain: Low Risk  (10/30/2023)   Received from [facility]   Overall Financial Resource Strain (CARDIA)    Difficulty of Paying Living Expenses: Not hard at all  Food Insecurity: No Food Insecurity (10/30/2023)   Received from [facility]   Hunger Vital Sign    Within the past 12 months, you worried that your food would run out before you got the money to buy more.: Never true    Within the past 12 months, the food you bought just didn't last and you didn't have money to get more.: Never true  Transportation Needs: No Transportation Needs (10/30/2023)   Received [facility] -  Transportation    In the past 12 months, has lack of transportation kept you from medical appointments or from getting medications?: No    Lack of Transportation (Non-Medical): No  Physical Activity: Not on file  Stress: Not on file  Social Connections: Not on file  Intimate Partner Violence: Not At Risk (07/17/2023)   Humiliation, Afraid, Rape, and Kick questionnaire    Fear of Current or Ex-Partner: No    Emotionally Abused: No    Physically Abused: No    Sexually Abused: No    Past Surgical History:  Procedure Laterality Date   CENTRAL LINE INSERTION-TUNNELED N/A 04/21/2023   Procedure: CENTRAL LINE INSERTION-TUNNELED;  Surgeon: Prescilla Brod, Ninette Basque, MD;  Location: [facility] INVASIVE CV LAB;  Service: Cardiovascular;  Laterality: N/A;   COLONOSCOPY WITH PROPOFOL  N/A 11/07/2017   Procedure: COLONOSCOPY WITH PROPOFOL ;  Surgeon: Luke Salaam, MD;  Location: [facility] ENDOSCOPY;  Service: Gastroenterology;  Laterality: N/A;   DIALYSIS/PERMA CATHETER REMOVAL N/A 05/26/2023   Procedure: DIALYSIS/PERMA CATHETER REMOVAL;  Surgeon: Celso College, MD;  Location: [facility] INVASIVE CV LAB;  Service: Cardiovascular;  Laterality: N/A;   ESOPHAGOGASTRODUODENOSCOPY (EGD) WITH PROPOFOL  N/A 11/07/2017   Procedure: ESOPHAGOGASTRODUODENOSCOPY (EGD) WITH PROPOFOL ;  Surgeon: Luke Salaam, MD;  Location: [facility] ENDOSCOPY;  Service: Gastroenterology;  Laterality: N/A;   LEFT HEART CATH AND CORONARY ANGIOGRAPHY N/A 10/23/2017   Procedure: LEFT HEART CATH AND CORONARY ANGIOGRAPHY;  Surgeon: Percival Brace, MD;  Location: [facility] INVASIVE CV LAB;  Service: Cardiovascular;  Laterality: N/A;    Family History  Problem Relation Age of Onset   Cancer Mother    CVA Father    Heart disease Father     Allergies  Allergen Reactions   Atorvastatin  Other (See Comments)    Other reaction(s): Not available Other reaction(s): Other (See Comments)    Red Dye #17 (Toney Red) Other (See Comments)   Red Dye #40 (Allura Red) Other (See  Comments)    not as allergic to it as I used to be. I can take a little of it       Latest Ref Rng & Units 07/15/2023    4:25 AM 07/14/2023    4:29 AM 07/13/2023    4:20 AM  CBC  WBC 4.0 - 10.5 K/uL 17.3  17.4  15.8   Hemoglobin 13.0 - 17.0 g/dL 16.1  09.6  04.5   Hematocrit 39.0 - 52.0 % 35.5  33.1  33.5   Platelets 150 - 400 K/uL 290  272  282       CMP     Component Value Date/Time   NA 131 (L) 07/15/2023 0425   NA 137 10/21/2020 1102   K 3.3 (L) 07/15/2023 0425   CL 91 (L) 07/15/2023 0425   CO2 28 07/15/2023 0425   GLUCOSE 289 (H) 07/15/2023 0425   BUN 66 (H) 07/15/2023 0425   BUN 35 (H) 10/21/2020 1102   CREATININE 2.65 (H) 07/15/2023 0425   CALCIUM  8.9 07/15/2023 0425   PROT 7.3 05/25/2023 0631   PROT 7.0 10/21/2020 1102   ALBUMIN  3.3 (L) 05/25/2023 0631   ALBUMIN  3.8 10/21/2020 1102   AST 112 (H) 05/25/2023 0631   ALT 76 (H) 05/25/2023 0631   ALKPHOS 90 05/25/2023 0631   BILITOT 0.4 05/25/2023 0631   BILITOT 0.3 10/21/2020 1102   EGFR 29 (L) 10/21/2020 1102   GFRNONAA 25 (L) 07/15/2023 0425     VAS US  ABI WITH/WO TBI Result Date: 01/08/2024  LOWER EXTREMITY DOPPLER STUDY Patient Name:  Phi Avans  Date of Exam:   01/03/2024 Medical Rec #: [id number]               Accession #:    [id number] Date of Birth: 1951/04/25               Patient Gender: M Patient Age:   43 years Exam Location:  [facility] Procedure:      VAS US  ABI WITH/WO TBI Referring Phys: Devon Fogo --------------------------------------------------------------------------------  Indications: Ulceration.  Performing Technologist: Tonie Franks RVS  Examination Guidelines: A complete evaluation includes at minimum, Doppler waveform signals and systolic blood pressure reading at the level of bilateral brachial, anterior tibial, and posterior tibial arteries, when vessel segments are accessible. Bilateral testing is considered an integral part of a complete examination.  Photoelectric Plethysmograph (PPG) waveforms and toe systolic pressure readings are included as required and additional duplex testing as needed. Limited examinations for reoccurring indications may be performed as noted.  ABI Findings: +---------+------------------+-----+---------+--------+ Right    Rt Pressure (mmHg)IndexWaveform Comment  +---------+------------------+-----+---------+--------+ Brachial 167                                      +---------+------------------+-----+---------+--------+ ATA      235               1.41 triphasicNC       +---------+------------------+-----+---------+--------+ PTA      250               1.50 triphasicNC       +---------+------------------+-----+---------+--------+ Great Toe140               0.84 Normal            +---------+------------------+-----+---------+--------+ +---------+------------------+-----+---------+-------+ Left     Lt Pressure (mmHg)IndexWaveform Comment +---------+------------------+-----+---------+-------+ Brachial 167                                     +---------+------------------+-----+---------+-------+ ATA      245               1.47 triphasicNC      +---------+------------------+-----+---------+-------+ PTA      241               1.44  triphasicNC      +---------+------------------+-----+---------+-------+ Great Toe131               0.78 Normal           +---------+------------------+-----+---------+-------+ +-------+-----------+-----------+------------+------------+ ABI/TBIToday's ABIToday's TBIPrevious ABIPrevious TBI +-------+-----------+-----------+------------+------------+ Right  >1.0 [place]ie    .84                                 +-------+-----------+-----------+------------+------------+ Left   >1.0 [place]    .78                                 +-------+-----------+-----------+------------+------------+  Summary: Right: Resting right ankle-brachial index indicates  noncompressible right lower extremity arteries. The right toe-brachial index is normal. Left: Resting left ankle-brachial index indicates noncompressible left lower extremity arteries. The left toe-brachial index is normal. *See table(s) above for measurements and observations.  Electronically signed by Devon Fogo MD on 01/08/2024 at 8:21:47 AM.    Final        Assessment & Plan:   1. Lymphedema (Primary) No surgery or intervention at this point in time.     I have had a long discussion with the patient regarding venous insufficiency and why it  causes symptoms, specifically venous ulceration. I have discussed with the patient the chronic skin changes that accompany venous insufficiency and the long term sequela such as infection and recurring  ulceration.  Patient will be placed in [employer] which will be changed weekly by home health for 4 weeks drainage permitting.  - Ambulatory referral to Home Health  2. Chronic diastolic congestive heart failure (HCC) This could also contribute to the patient's lower extremity edema   3. Essential hypertension Continue antihypertensive medications as already ordered, these medications have been reviewed and there are no changes at this time.    Current Outpatient Medications on File Prior to Visit  Medication Sig Dispense Refill   albuterol  (VENTOLIN  HFA) 108 (90 Base) MCG/ACT inhaler Inhale 1-2 puffs into the lungs every 6 (six) hours as needed for wheezing or shortness of breath. 1 each 5   allopurinol  (ZYLOPRIM ) 100 MG tablet TAKE ONE TABLET (100 MG) BY MOUTH ONCE DAILY 90 tablet 1   amiodarone  (PACERONE ) 200 MG tablet Take 1 tablet (200 mg total) by mouth daily. 30 tablet 0   aspirin  EC 81 MG tablet Take 81 mg by mouth daily. Swallow whole.     cefadroxil  (DURICEF) 500 MG capsule Take 1 capsule (500 mg total) by mouth 2 (two) times daily. 60 capsule 0   colchicine  0.6 MG tablet Take 0.6 mg by mouth daily.     Continuous Glucose Sensor  (FREESTYLE LIBRE 3 PLUS SENSOR) MISC      diphenhydrAMINE  (BENADRYL ) 25 mg capsule Take 25 mg by mouth every 6 (six) hours as needed for itching or allergies.     doxycycline  (VIBRA -TABS) 100 MG tablet Take 100 mg by mouth 2 (two) times daily.     glimepiride (AMARYL) 1 MG tablet Take 1 mg by mouth daily with breakfast.     insulin  glargine, 2 Unit Dial, (TOUJEO  MAX) 300 UNIT/ML Solostar Pen Inject 80 Units into the skin.     ipratropium (ATROVENT ) 0.02 % nebulizer solution Take 2.5 mLs (0.5 mg total) by nebulization every 6 (six) hours as needed for wheezing or shortness of breath. 225 mL 1   JANUVIA 50 MG  tablet Take 50 mg by mouth.     lactulose (CHRONULAC) 10 GM/15ML solution Take 10 g by mouth 2 (two) times daily as needed for mild constipation or moderate constipation.     lactulose, encephalopathy, (CHRONULAC) 10 GM/15ML SOLN Take 15 mLs by mouth.     levothyroxine  (SYNTHROID ) 112 MCG tablet Take 112 mcg by mouth daily before breakfast.     loratadine  (CLARITIN ) 10 MG tablet Take 10 mg by mouth daily.      multivitamin (ONE-A-DAY MEN'S) TABS tablet Take 1 tablet by mouth daily.     mupirocin  ointment (BACTROBAN ) 2 % Apply 1 Application topically 2 (two) times daily. Apply to the back BID 22 g 0   nitroGLYCERIN  (NITROSTAT ) 0.4 MG SL tablet Place 1 tablet (0.4 mg total) under the tongue every 5 (five) minutes as needed for chest pain. 30 tablet 0   oxycodone  (OXY-IR) 5 MG capsule Take 5 mg by mouth every 6 (six) hours as needed for pain.     spironolactone  (ALDACTONE ) 25 MG tablet Take 25 mg by mouth daily.     sucralfate  (CARAFATE ) 1 GM/10ML suspension      torsemide  (DEMADEX ) 20 MG tablet Take 80 mg by mouth daily.     TRESIBA  FLEXTOUCH 100 UNIT/ML FlexTouch Pen 120 Units 2 (two) times daily.     Current Facility-Administered Medications on File Prior to Visit  Medication Dose Route Frequency Provider Last Rate Last Admin   lidocaine -EPINEPHrine  (PF) (XYLOCAINE -EPINEPHrine ) 1 %-1:200000  (PF) injection    PRN Dew, Jason S, MD   20 mL at 05/26/23 1648    There are no Patient Instructions on file for this visit. No follow-ups on file.   Annamaria Barrette, NP

## 2024-02-03 DIAGNOSIS — I890 Lymphedema, not elsewhere classified: Secondary | ICD-10-CM | POA: Diagnosis not present

## 2024-02-03 DIAGNOSIS — Z6841 Body Mass Index (BMI) 40.0 and over, adult: Secondary | ICD-10-CM | POA: Diagnosis not present

## 2024-02-03 DIAGNOSIS — E1151 Type 2 diabetes mellitus with diabetic peripheral angiopathy without gangrene: Secondary | ICD-10-CM | POA: Diagnosis not present

## 2024-02-03 DIAGNOSIS — K219 Gastro-esophageal reflux disease without esophagitis: Secondary | ICD-10-CM | POA: Diagnosis not present

## 2024-02-03 DIAGNOSIS — M2042 Other hammer toe(s) (acquired), left foot: Secondary | ICD-10-CM | POA: Diagnosis not present

## 2024-02-03 DIAGNOSIS — Z9981 Dependence on supplemental oxygen: Secondary | ICD-10-CM | POA: Diagnosis not present

## 2024-02-03 DIAGNOSIS — M109 Gout, unspecified: Secondary | ICD-10-CM | POA: Diagnosis not present

## 2024-02-03 DIAGNOSIS — J449 Chronic obstructive pulmonary disease, unspecified: Secondary | ICD-10-CM | POA: Diagnosis not present

## 2024-02-03 DIAGNOSIS — Z794 Long term (current) use of insulin: Secondary | ICD-10-CM | POA: Diagnosis not present

## 2024-02-03 DIAGNOSIS — E669 Obesity, unspecified: Secondary | ICD-10-CM | POA: Diagnosis not present

## 2024-02-03 DIAGNOSIS — R32 Unspecified urinary incontinence: Secondary | ICD-10-CM | POA: Diagnosis not present

## 2024-02-03 DIAGNOSIS — I5032 Chronic diastolic (congestive) heart failure: Secondary | ICD-10-CM | POA: Diagnosis not present

## 2024-02-03 DIAGNOSIS — I110 Hypertensive heart disease with heart failure: Secondary | ICD-10-CM | POA: Diagnosis not present

## 2024-02-03 DIAGNOSIS — Z7984 Long term (current) use of oral hypoglycemic drugs: Secondary | ICD-10-CM | POA: Diagnosis not present

## 2024-02-03 DIAGNOSIS — Z8701 Personal history of pneumonia (recurrent): Secondary | ICD-10-CM | POA: Diagnosis not present

## 2024-02-03 DIAGNOSIS — M2041 Other hammer toe(s) (acquired), right foot: Secondary | ICD-10-CM | POA: Diagnosis not present

## 2024-02-03 DIAGNOSIS — Z7982 Long term (current) use of aspirin: Secondary | ICD-10-CM | POA: Diagnosis not present

## 2024-02-05 ENCOUNTER — Telehealth (INDEPENDENT_AMBULATORY_CARE_PROVIDER_SITE_OTHER): Payer: Self-pay

## 2024-02-05 NOTE — Telephone Encounter (Signed)
 Shelvy Dickens RN with [facility] left a message stating that skilled nursing has evaluated. Patient will be seen 1 time weekly for 5 weeks for ble unna wraps

## 2024-02-06 DIAGNOSIS — G4733 Obstructive sleep apnea (adult) (pediatric): Secondary | ICD-10-CM | POA: Diagnosis not present

## 2024-02-06 DIAGNOSIS — J449 Chronic obstructive pulmonary disease, unspecified: Secondary | ICD-10-CM | POA: Diagnosis not present

## 2024-02-06 DIAGNOSIS — R0609 Other forms of dyspnea: Secondary | ICD-10-CM | POA: Diagnosis not present

## 2024-02-06 DIAGNOSIS — E66813 Obesity, class 3: Secondary | ICD-10-CM | POA: Diagnosis not present

## 2024-02-06 DIAGNOSIS — J301 Allergic rhinitis due to pollen: Secondary | ICD-10-CM | POA: Diagnosis not present

## 2024-02-08 ENCOUNTER — Encounter (INDEPENDENT_AMBULATORY_CARE_PROVIDER_SITE_OTHER)

## 2024-02-08 DIAGNOSIS — Z789 Other specified health status: Secondary | ICD-10-CM | POA: Diagnosis not present

## 2024-02-08 DIAGNOSIS — Z1331 Encounter for screening for depression: Secondary | ICD-10-CM | POA: Diagnosis not present

## 2024-02-08 DIAGNOSIS — E039 Hypothyroidism, unspecified: Secondary | ICD-10-CM | POA: Diagnosis not present

## 2024-02-08 DIAGNOSIS — E1122 Type 2 diabetes mellitus with diabetic chronic kidney disease: Secondary | ICD-10-CM | POA: Diagnosis not present

## 2024-02-08 DIAGNOSIS — N184 Chronic kidney disease, stage 4 (severe): Secondary | ICD-10-CM | POA: Diagnosis not present

## 2024-02-08 DIAGNOSIS — E66813 Obesity, class 3: Secondary | ICD-10-CM | POA: Diagnosis not present

## 2024-02-08 DIAGNOSIS — L97509 Non-pressure chronic ulcer of other part of unspecified foot with unspecified severity: Secondary | ICD-10-CM | POA: Diagnosis not present

## 2024-02-08 DIAGNOSIS — E1165 Type 2 diabetes mellitus with hyperglycemia: Secondary | ICD-10-CM | POA: Diagnosis not present

## 2024-02-08 DIAGNOSIS — Z794 Long term (current) use of insulin: Secondary | ICD-10-CM | POA: Diagnosis not present

## 2024-02-08 DIAGNOSIS — E1159 Type 2 diabetes mellitus with other circulatory complications: Secondary | ICD-10-CM | POA: Diagnosis not present

## 2024-02-08 DIAGNOSIS — E11621 Type 2 diabetes mellitus with foot ulcer: Secondary | ICD-10-CM | POA: Diagnosis not present

## 2024-02-15 ENCOUNTER — Encounter (INDEPENDENT_AMBULATORY_CARE_PROVIDER_SITE_OTHER)

## 2024-02-22 ENCOUNTER — Encounter (INDEPENDENT_AMBULATORY_CARE_PROVIDER_SITE_OTHER)

## 2024-02-26 ENCOUNTER — Telehealth (INDEPENDENT_AMBULATORY_CARE_PROVIDER_SITE_OTHER): Payer: Self-pay

## 2024-02-26 NOTE — Telephone Encounter (Signed)
 Patient reach out to the office stating that he has only been wrapped once since his orders were sent to home health. Patient states that wraps stayed on for 4 days. Patient has received shipment but his significant other wrapped his legs. Home health nurse reach out to patient to scheduled unna wraps but supplies had been used. Patient is scheduled to be seen on Wednesday due patient is concerned with right le sore. Home health is scheduled to come on Friday

## 2024-02-27 DIAGNOSIS — E1165 Type 2 diabetes mellitus with hyperglycemia: Secondary | ICD-10-CM | POA: Diagnosis not present

## 2024-02-28 ENCOUNTER — Encounter (INDEPENDENT_AMBULATORY_CARE_PROVIDER_SITE_OTHER): Payer: Self-pay

## 2024-02-28 ENCOUNTER — Ambulatory Visit (INDEPENDENT_AMBULATORY_CARE_PROVIDER_SITE_OTHER): Admitting: Nurse Practitioner

## 2024-02-28 VITALS — BP 124/69 | HR 79 | Resp 16

## 2024-02-28 DIAGNOSIS — I890 Lymphedema, not elsewhere classified: Secondary | ICD-10-CM

## 2024-02-28 NOTE — Progress Notes (Signed)
 History of Present Illness  There is no documented history at this time  Assessments & Plan   There are no diagnoses linked to this encounter.    Additional instructions  Subjective:  Patient presents with venous ulcer of the Bilateral lower extremity.    Procedure:  3 layer unna wrap was placed Bilateral lower extremity.   Plan:   Follow up in one week.

## 2024-02-29 ENCOUNTER — Encounter (INDEPENDENT_AMBULATORY_CARE_PROVIDER_SITE_OTHER)

## 2024-03-04 DIAGNOSIS — J449 Chronic obstructive pulmonary disease, unspecified: Secondary | ICD-10-CM | POA: Diagnosis not present

## 2024-03-04 DIAGNOSIS — R0902 Hypoxemia: Secondary | ICD-10-CM | POA: Diagnosis not present

## 2024-03-06 ENCOUNTER — Ambulatory Visit (INDEPENDENT_AMBULATORY_CARE_PROVIDER_SITE_OTHER): Admitting: Vascular Surgery

## 2024-03-06 ENCOUNTER — Telehealth (INDEPENDENT_AMBULATORY_CARE_PROVIDER_SITE_OTHER): Payer: Self-pay

## 2024-03-06 ENCOUNTER — Encounter (INDEPENDENT_AMBULATORY_CARE_PROVIDER_SITE_OTHER): Payer: Self-pay | Admitting: Vascular Surgery

## 2024-03-06 VITALS — BP 124/76 | HR 79 | Resp 18 | Ht 68.0 in | Wt 299.0 lb

## 2024-03-06 DIAGNOSIS — I5032 Chronic diastolic (congestive) heart failure: Secondary | ICD-10-CM | POA: Diagnosis not present

## 2024-03-06 DIAGNOSIS — I890 Lymphedema, not elsewhere classified: Secondary | ICD-10-CM | POA: Diagnosis not present

## 2024-03-06 DIAGNOSIS — I10 Essential (primary) hypertension: Secondary | ICD-10-CM

## 2024-03-06 NOTE — Progress Notes (Signed)
 Subjective:    Patient ID: Jason Hudson, male    DOB: [birth year], 73 y.o.   MRN: [id number] Chief Complaint  Patient presents with   Follow-up    Unna follow up     The patient is a 73 year old male who presents today with chief complaint of bilateral lower extremity leg swelling.  He had a toe wound that was slow to heal with decreased pulses that is now healed.  The toe wound is well healed but he notes that he has recurrent significant lower extremity edema.  He has noncompressible ABIs bilaterally with normal TBI's bilaterally from previous ultrasound.  He has strong triphasic waveforms and normal toe waveforms bilaterally. He reports falling twice creating right and left scabs to his lower extremities. Swelling appears to be better than last visit.    He has previously tried to wear compression but not been successful.  Currently he has no evidence of venous ulcerations.     Review of Systems  Constitutional: Negative.   Cardiovascular:  Positive for leg swelling.  Skin:  Positive for color change and wound.  All other systems reviewed and are negative.      Objective:   Physical Exam Constitutional:      Appearance: Normal appearance. He is obese.  HENT:     Head: Normocephalic.  Eyes:     Pupils: Pupils are equal, round, and reactive to light.  Cardiovascular:     Rate and Rhythm: Normal rate and regular rhythm.     Pulses: Normal pulses.     Heart sounds: Normal heart sounds.  Pulmonary:     Effort: Pulmonary effort is normal.     Breath sounds: Normal breath sounds.  Abdominal:     General: Bowel sounds are normal.     Palpations: Abdomen is soft.  Musculoskeletal:        General: Swelling, tenderness and signs of injury present.     Right lower leg: Edema present.     Left lower leg: Edema present.     Comments: Bilateral scabs to lower extremities after falling.   Skin:    General: Skin is warm and dry.     Capillary Refill: Capillary refill  takes 2 to 3 seconds.  Neurological:     General: No focal deficit present.     Mental Status: He is alert and oriented to person, place, and time. Mental status is at baseline.  Psychiatric:        Mood and Affect: Mood normal.        Behavior: Behavior normal.        Thought Content: Thought content normal.        Judgment: Judgment normal.     BP 124/76 (BP Location: Left Arm, Patient Position: Sitting, Cuff Size: Large)   Pulse 79   Resp 18   Ht 5' 8 (1.727 m)   Wt 299 lb (135.6 kg)   BMI 45.46 kg/m   Past Medical History:  Diagnosis Date   CHF (congestive heart failure) (HCC)    COPD (chronic obstructive pulmonary disease) (HCC)    Diabetes mellitus without complication (HCC)    Edema    leg swelling   Gastroesophageal reflux disease 12/15/2017   Gout    Hypertension    Melena 11/05/2017   Pneumonia 07/27/2016    Social History   Socioeconomic History   Marital status: Single    Spouse name: Not on file   Number of children: Not on file  Years of education: Not on file   Highest education level: Not on file  Occupational History   Not on file  Tobacco Use   Smoking status: Never   Smokeless tobacco: Never  Substance and Sexual Activity   Alcohol use: Not Currently    Comment: ocassionally   Drug use: Never   Sexual activity: Not Currently  Other Topics Concern   Not on file  Social History Narrative   Not on file   Social Drivers of Health   Financial Resource Strain: Low Risk  (10/30/2023)   Received from [facility]   Overall Financial Resource Strain (CARDIA)    Difficulty of Paying Living Expenses: Not hard at all  Food Insecurity: No Food Insecurity (10/30/2023)   Received from [facility]   Hunger Vital Sign    Within the past 12 months, you worried that your food would run out before you got the money to buy more.: Never true    Within the past 12 months, the food you bought just didn't last and you didn't  have money to get more.: Never true  Transportation Needs: No Transportation Needs (10/30/2023)   Received from [facility] - Transportation    In the past 12 months, has lack of transportation kept you from medical appointments or from getting medications?: No    Lack of Transportation (Non-Medical): No  Physical Activity: Not on file  Stress: Not on file  Social Connections: Not on file  Intimate Partner Violence: Not At Risk (07/17/2023)   Humiliation, Afraid, Rape, and Kick questionnaire    Fear of Current or Ex-Partner: No    Emotionally Abused: No    Physically Abused: No    Sexually Abused: No    Past Surgical History:  Procedure Laterality Date   CENTRAL LINE INSERTION-TUNNELED N/A 04/21/2023   Procedure: CENTRAL LINE INSERTION-TUNNELED;  Surgeon: Jama, Cordella MATSU, MD;  Location: [facility] INVASIVE CV LAB;  Service: Cardiovascular;  Laterality: N/A;   COLONOSCOPY WITH PROPOFOL  N/A 11/07/2017   Procedure: COLONOSCOPY WITH PROPOFOL ;  Surgeon: Therisa Bi, MD;  Location: [facility] ENDOSCOPY;  Service: Gastroenterology;  Laterality: N/A;   DIALYSIS/PERMA CATHETER REMOVAL N/A 05/26/2023   Procedure: DIALYSIS/PERMA CATHETER REMOVAL;  Surgeon: Marea Selinda RAMAN, MD;  Location: [facility] INVASIVE CV LAB;  Service: Cardiovascular;  Laterality: N/A;   ESOPHAGOGASTRODUODENOSCOPY (EGD) WITH PROPOFOL  N/A 11/07/2017   Procedure: ESOPHAGOGASTRODUODENOSCOPY (EGD) WITH PROPOFOL ;  Surgeon: Therisa Bi, MD;  Location: [facility] ENDOSCOPY;  Service: Gastroenterology;  Laterality: N/A;   LEFT HEART CATH AND CORONARY ANGIOGRAPHY N/A 10/23/2017   Procedure: LEFT HEART CATH AND CORONARY ANGIOGRAPHY;  Surgeon: Ammon Blunt, MD;  Location: [facility] INVASIVE CV LAB;  Service: Cardiovascular;  Laterality: N/A;    Family History  Problem Relation Age of Onset   Cancer Mother    CVA Father    Heart disease Father     Allergies  Allergen Reactions   Atorvastatin  Other (See Comments)    Other  reaction(s): Not available Other reaction(s): Other (See Comments)    Red Dye #17 (Toney Red) Other (See Comments)   Red Dye #40 (Allura Red) Other (See Comments)    not as allergic to it as I used to be. I can take a little of it       Latest Ref Rng & Units 07/15/2023    4:25 AM 07/14/2023    4:29 AM 07/13/2023    4:20 AM  CBC  WBC 4.0 - 10.5  K/uL 17.3  17.4  15.8   Hemoglobin 13.0 - 17.0 g/dL 88.4  89.1  89.0   Hematocrit 39.0 - 52.0 % 35.5  33.1  33.5   Platelets 150 - 400 K/uL 290  272  282       CMP     Component Value Date/Time   NA 131 (L) 07/15/2023 0425   NA 137 10/21/2020 1102   K 3.3 (L) 07/15/2023 0425   CL 91 (L) 07/15/2023 0425   CO2 28 07/15/2023 0425   GLUCOSE 289 (H) 07/15/2023 0425   BUN 66 (H) 07/15/2023 0425   BUN 35 (H) 10/21/2020 1102   CREATININE 2.65 (H) 07/15/2023 0425   CALCIUM  8.9 07/15/2023 0425   PROT 7.3 05/25/2023 0631   PROT 7.0 10/21/2020 1102   ALBUMIN  3.3 (L) 05/25/2023 0631   ALBUMIN  3.8 10/21/2020 1102   AST 112 (H) 05/25/2023 0631   ALT 76 (H) 05/25/2023 0631   ALKPHOS 90 05/25/2023 0631   BILITOT 0.4 05/25/2023 0631   BILITOT 0.3 10/21/2020 1102   EGFR 29 (L) 10/21/2020 1102   GFRNONAA 25 (L) 07/15/2023 0425     No results found.     Assessment & Plan:   1. Lymphedema (Primary) No surgery or intervention at this point in time.     I have had a long discussion with the patient regarding venous insufficiency and why it  causes symptoms, specifically venous ulceration. I have discussed with the patient the chronic skin changes that accompany venous insufficiency and the long term sequela such as infection and recurring  ulceration.  Patient will be placed in [employer] which will be changed weekly by home health for 4 weeks drainage permitting.   - Ambulatory referral to Home Health. I had a long discussion regarding Home Health changing his wraps and not his girl friend. He was counseled to let home health see him and  if he refusing going forward he will be discharged from [facility] and not get supplies. He was informed he would have to supply his own unna boot supplies going forward.   2. Essential hypertension Continue antihypertensive medications as already ordered, these medications have been reviewed and there are no changes at this time.   3. Chronic diastolic congestive heart failure (HCC) This could also contribute to the patient's lower extremity edema. Patient counseled to be aware of his diet limiting salt and take any medications to help control CHF   Current Outpatient Medications on File Prior to Visit  Medication Sig Dispense Refill   albuterol  (VENTOLIN  HFA) 108 (90 Base) MCG/ACT inhaler Inhale 1-2 puffs into the lungs every 6 (six) hours as needed for wheezing or shortness of breath. 1 each 5   allopurinol  (ZYLOPRIM ) 100 MG tablet TAKE ONE TABLET (100 MG) BY MOUTH ONCE DAILY 90 tablet 1   amiodarone  (PACERONE ) 200 MG tablet Take 1 tablet (200 mg total) by mouth daily. 30 tablet 0   aspirin  EC 81 MG tablet Take 81 mg by mouth daily. Swallow whole.     cefadroxil  (DURICEF) 500 MG capsule Take 1 capsule (500 mg total) by mouth 2 (two) times daily. 60 capsule 0   colchicine  0.6 MG tablet Take 0.6 mg by mouth daily.     Continuous Glucose Sensor (FREESTYLE LIBRE 3 PLUS SENSOR) MISC      diphenhydrAMINE  (BENADRYL ) 25 mg capsule Take 25 mg by mouth every 6 (six) hours as needed for itching or allergies.     doxycycline  (  VIBRA -TABS) 100 MG tablet Take 100 mg by mouth 2 (two) times daily.     glimepiride (AMARYL) 1 MG tablet Take 1 mg by mouth daily with breakfast.     insulin  glargine, 2 Unit Dial, (TOUJEO  MAX) 300 UNIT/ML Solostar Pen Inject 80 Units into the skin.     ipratropium (ATROVENT ) 0.02 % nebulizer solution Take 2.5 mLs (0.5 mg total) by nebulization every 6 (six) hours as needed for wheezing or shortness of breath. 225 mL 1   JANUVIA 50 MG tablet Take 50 mg by mouth.      lactulose (CHRONULAC) 10 GM/15ML solution Take 10 g by mouth 2 (two) times daily as needed for mild constipation or moderate constipation.     lactulose, encephalopathy, (CHRONULAC) 10 GM/15ML SOLN Take 15 mLs by mouth.     levothyroxine  (SYNTHROID ) 112 MCG tablet Take 112 mcg by mouth daily before breakfast.     loratadine  (CLARITIN ) 10 MG tablet Take 10 mg by mouth daily.      multivitamin (ONE-A-DAY MEN'S) TABS tablet Take 1 tablet by mouth daily.     mupirocin  ointment (BACTROBAN ) 2 % Apply 1 Application topically 2 (two) times daily. Apply to the back BID 22 g 0   nitroGLYCERIN  (NITROSTAT ) 0.4 MG SL tablet Place 1 tablet (0.4 mg total) under the tongue every 5 (five) minutes as needed for chest pain. 30 tablet 0   oxycodone  (OXY-IR) 5 MG capsule Take 5 mg by mouth every 6 (six) hours as needed for pain.     spironolactone  (ALDACTONE ) 25 MG tablet Take 25 mg by mouth daily.     sucralfate  (CARAFATE ) 1 GM/10ML suspension      torsemide  (DEMADEX ) 20 MG tablet Take 80 mg by mouth daily.     TRESIBA  FLEXTOUCH 100 UNIT/ML FlexTouch Pen 120 Units 2 (two) times daily.     Current Facility-Administered Medications on File Prior to Visit  Medication Dose Route Frequency Provider Last Rate Last Admin   lidocaine -EPINEPHrine  (PF) (XYLOCAINE -EPINEPHrine ) 1 %-1:200000 (PF) injection    PRN Dew, Jason S, MD   20 mL at 05/26/23 1648    There are no Patient Instructions on file for this visit. No follow-ups on file.   Gwendlyn JONELLE Shank, NP

## 2024-03-06 NOTE — Telephone Encounter (Signed)
 Looks like he is coming in to see you today.  Just wanted to make you aware so you can have a discussion regarding what he would like to move forward with.

## 2024-03-06 NOTE — Telephone Encounter (Signed)
 Chyrl, Nurse with [facility] left a voicemail with questions about patients leg wraps. Chyrl stated Patient refused home health to come into the home a couple of times and  his girlfriend has been wrapping his legs for him.  Returned call to [employer] patients home health nurse. Initial encounter he stated patient had taken his wraps off and let his girlfriend wrap his legs. When he went out for his visit to wrap patients legs, there were no supplies because the girlfriend had used them to wrap the legs. So he ordered more supplies. He also stated patient keeps taking the wraps off because he wants the girlfriend to wrap his legs or our office. John also stated 2 weeks ago when he saw patient there were no open areas on his legs and he asked if we wrapped the patients legs last week and if there were any open areas. I wrapped the patients legs last week in the office and there were no open areas at that time. He also stated he called patient yesterday to schedule a time to come out and wrap his legs and patient did not answer the phone.   John's concern is he is going to be discharged from home health so he wanted me to reach out to provider to make them aware.

## 2024-03-11 DIAGNOSIS — L97522 Non-pressure chronic ulcer of other part of left foot with fat layer exposed: Secondary | ICD-10-CM | POA: Diagnosis not present

## 2024-03-11 DIAGNOSIS — E1142 Type 2 diabetes mellitus with diabetic polyneuropathy: Secondary | ICD-10-CM | POA: Diagnosis not present

## 2024-03-25 DIAGNOSIS — E11621 Type 2 diabetes mellitus with foot ulcer: Secondary | ICD-10-CM | POA: Diagnosis not present

## 2024-03-25 DIAGNOSIS — E1165 Type 2 diabetes mellitus with hyperglycemia: Secondary | ICD-10-CM | POA: Diagnosis not present

## 2024-03-25 DIAGNOSIS — N184 Chronic kidney disease, stage 4 (severe): Secondary | ICD-10-CM | POA: Diagnosis not present

## 2024-03-25 DIAGNOSIS — E039 Hypothyroidism, unspecified: Secondary | ICD-10-CM | POA: Diagnosis not present

## 2024-03-25 DIAGNOSIS — Z794 Long term (current) use of insulin: Secondary | ICD-10-CM | POA: Diagnosis not present

## 2024-03-25 DIAGNOSIS — E66813 Obesity, class 3: Secondary | ICD-10-CM | POA: Diagnosis not present

## 2024-03-25 DIAGNOSIS — L97509 Non-pressure chronic ulcer of other part of unspecified foot with unspecified severity: Secondary | ICD-10-CM | POA: Diagnosis not present

## 2024-03-25 DIAGNOSIS — E1122 Type 2 diabetes mellitus with diabetic chronic kidney disease: Secondary | ICD-10-CM | POA: Diagnosis not present

## 2024-03-25 DIAGNOSIS — E1159 Type 2 diabetes mellitus with other circulatory complications: Secondary | ICD-10-CM | POA: Diagnosis not present

## 2024-04-03 DIAGNOSIS — N184 Chronic kidney disease, stage 4 (severe): Secondary | ICD-10-CM | POA: Diagnosis not present

## 2024-04-03 DIAGNOSIS — R809 Proteinuria, unspecified: Secondary | ICD-10-CM | POA: Diagnosis not present

## 2024-04-03 DIAGNOSIS — N2581 Secondary hyperparathyroidism of renal origin: Secondary | ICD-10-CM | POA: Diagnosis not present

## 2024-04-03 DIAGNOSIS — N189 Chronic kidney disease, unspecified: Secondary | ICD-10-CM | POA: Diagnosis not present

## 2024-04-03 DIAGNOSIS — I129 Hypertensive chronic kidney disease with stage 1 through stage 4 chronic kidney disease, or unspecified chronic kidney disease: Secondary | ICD-10-CM | POA: Diagnosis not present

## 2024-04-03 DIAGNOSIS — I509 Heart failure, unspecified: Secondary | ICD-10-CM | POA: Diagnosis not present

## 2024-04-03 DIAGNOSIS — E1122 Type 2 diabetes mellitus with diabetic chronic kidney disease: Secondary | ICD-10-CM | POA: Diagnosis not present

## 2024-04-04 DIAGNOSIS — J449 Chronic obstructive pulmonary disease, unspecified: Secondary | ICD-10-CM | POA: Diagnosis not present

## 2024-04-04 DIAGNOSIS — R0902 Hypoxemia: Secondary | ICD-10-CM | POA: Diagnosis not present

## 2024-04-09 DIAGNOSIS — Z794 Long term (current) use of insulin: Secondary | ICD-10-CM | POA: Diagnosis not present

## 2024-04-09 DIAGNOSIS — I5022 Chronic systolic (congestive) heart failure: Secondary | ICD-10-CM | POA: Diagnosis not present

## 2024-04-09 DIAGNOSIS — E039 Hypothyroidism, unspecified: Secondary | ICD-10-CM | POA: Diagnosis not present

## 2024-04-09 DIAGNOSIS — E1122 Type 2 diabetes mellitus with diabetic chronic kidney disease: Secondary | ICD-10-CM | POA: Diagnosis not present

## 2024-04-09 DIAGNOSIS — Z1331 Encounter for screening for depression: Secondary | ICD-10-CM | POA: Diagnosis not present

## 2024-04-09 DIAGNOSIS — E859 Amyloidosis, unspecified: Secondary | ICD-10-CM | POA: Diagnosis not present

## 2024-04-09 DIAGNOSIS — E119 Type 2 diabetes mellitus without complications: Secondary | ICD-10-CM | POA: Diagnosis not present

## 2024-04-09 DIAGNOSIS — N184 Chronic kidney disease, stage 4 (severe): Secondary | ICD-10-CM | POA: Diagnosis not present

## 2024-04-09 DIAGNOSIS — M4620 Osteomyelitis of vertebra, site unspecified: Secondary | ICD-10-CM | POA: Diagnosis not present

## 2024-04-09 DIAGNOSIS — Z0000 Encounter for general adult medical examination without abnormal findings: Secondary | ICD-10-CM | POA: Diagnosis not present

## 2024-04-10 ENCOUNTER — Ambulatory Visit (INDEPENDENT_AMBULATORY_CARE_PROVIDER_SITE_OTHER): Admitting: Vascular Surgery

## 2024-04-12 ENCOUNTER — Ambulatory Visit (INDEPENDENT_AMBULATORY_CARE_PROVIDER_SITE_OTHER): Admitting: Vascular Surgery

## 2024-04-12 ENCOUNTER — Encounter (INDEPENDENT_AMBULATORY_CARE_PROVIDER_SITE_OTHER): Payer: Self-pay | Admitting: Vascular Surgery

## 2024-04-12 VITALS — BP 122/78 | HR 78 | Ht 68.0 in | Wt 289.0 lb

## 2024-04-12 DIAGNOSIS — I10 Essential (primary) hypertension: Secondary | ICD-10-CM

## 2024-04-12 DIAGNOSIS — I890 Lymphedema, not elsewhere classified: Secondary | ICD-10-CM | POA: Diagnosis not present

## 2024-04-12 DIAGNOSIS — I5032 Chronic diastolic (congestive) heart failure: Secondary | ICD-10-CM | POA: Diagnosis not present

## 2024-04-12 NOTE — Progress Notes (Signed)
 Subjective:    Patient ID: Jason Hudson, male    DOB: 1951-06-24, 73 y.o.   MRN: [id number] Chief Complaint  Patient presents with   Follow-up    Jason Hudson is a 73 yo male who returns today for follow-up regarding 4 weeks of Unna boot wraps to bilateral lower extremities.  Unfortunately he brings bad news as he reports home health he was supposed to come to his house once a week changes due to wraps show to have once without changing the wraps some never came back.  There were problems prior to this where we had to get involved to call the home health company.  I was under the assumption we had not resolved and we had worked out how to get Jason Hudson taken care of at home with weekly Unna boot wraps.  That his wraps have not been changed for 4 weeks after removal today he has a sore to his right lateral calf with a scab on it.  He said he hit his leg so this is not healing well.  Swelling to his bilateral lower extremities remains the same +3.  Color to both of his lower extremities remains purple.  Not much progress has been made because his wraps have not been changed.    Review of Systems  Constitutional: Negative.   Cardiovascular:  Positive for leg swelling.  Musculoskeletal:  Positive for myalgias.  Skin:  Positive for color change.  All other systems reviewed and are negative.      Objective:   Physical Exam Constitutional:      Appearance: He is obese.  HENT:     Head: Normocephalic.  Eyes:     Pupils: Pupils are equal, round, and reactive to light.  Cardiovascular:     Rate and Rhythm: Normal rate and regular rhythm.     Pulses: Normal pulses.     Heart sounds: Normal heart sounds.  Pulmonary:     Effort: Pulmonary effort is normal.     Breath sounds: Normal breath sounds.  Abdominal:     General: Bowel sounds are normal.     Palpations: Abdomen is soft.  Musculoskeletal:        General: Swelling and tenderness present.     Right lower leg: Edema  present.     Left lower leg: Edema present.  Skin:    General: Skin is warm and dry.     Capillary Refill: Capillary refill takes 2 to 3 seconds.     Findings: Erythema and lesion present.  Neurological:     General: No focal deficit present.     Mental Status: He is alert and oriented to person, place, and time. Mental status is at baseline.  Psychiatric:        Mood and Affect: Mood normal.        Behavior: Behavior normal.        Thought Content: Thought content normal.        Judgment: Judgment normal.     BP 122/78   Pulse 78   Ht 5' 8 (1.727 m)   Wt 289 lb (131.1 kg)   BMI 43.94 kg/m   Past Medical History:  Diagnosis Date   CHF (congestive heart failure) (HCC)    COPD (chronic obstructive pulmonary disease) (HCC)    Diabetes mellitus without complication (HCC)    Edema    leg swelling   Gastroesophageal reflux disease 12/15/2017   Gout    Hypertension  Melena 11/05/2017   Pneumonia 07/27/2016    Social History   Socioeconomic History   Marital status: Single    Spouse name: Not on file   Number of children: Not on file   Years of education: Not on file   Highest education level: Not on file  Occupational History   Not on file  Tobacco Use   Smoking status: Never   Smokeless tobacco: Never  Substance and Sexual Activity   Alcohol use: Not Currently    Comment: ocassionally   Drug use: Never   Sexual activity: Not Currently  Other Topics Concern   Not on file  Social History Narrative   Not on file   Social Drivers of Health   Financial Resource Strain: Low Risk  (04/09/2024)   Received from [facility]   Overall Financial Resource Strain (CARDIA)    Difficulty of Paying Living Expenses: Not hard at all  Food Insecurity: No Food Insecurity (04/09/2024)   Received from [facility]   Hunger Vital Sign    Within the past 12 months, you worried that your food would run out before you got the money to buy more.:  Never true    Within the past 12 months, the food you bought just didn't last and you didn't have money to get more.: Never true  Transportation Needs: No Transportation Needs (04/09/2024)   Received from [facility] - [facility]    In the past 12 months, has lack of transportation kept you from medical appointments or from getting medications?: No    Lack of Transportation (Non-Medical): No  Physical Activity: Not on file  Stress: Not on file  Social Connections: Not on file  Intimate Partner Violence: Not At Risk (07/17/2023)   Humiliation, Afraid, Rape, and Kick questionnaire    Fear of Current or Ex-Partner: No    Emotionally Abused: No    Physically Abused: No    Sexually Abused: No    Past Surgical History:  Procedure Laterality Date   CENTRAL LINE INSERTION-TUNNELED N/A 04/21/2023   Procedure: CENTRAL LINE INSERTION-TUNNELED;  Surgeon: Jama, Cordella MATSU, MD;  Location: [facility] INVASIVE CV LAB;  Service: Cardiovascular;  Laterality: N/A;   COLONOSCOPY WITH PROPOFOL  N/A 11/07/2017   Procedure: COLONOSCOPY WITH PROPOFOL ;  Surgeon: Therisa Bi, MD;  Location: [facility] ENDOSCOPY;  Service: Gastroenterology;  Laterality: N/A;   DIALYSIS/PERMA CATHETER REMOVAL N/A 05/26/2023   Procedure: DIALYSIS/PERMA CATHETER REMOVAL;  Surgeon: Marea Selinda RAMAN, MD;  Location: [facility] INVASIVE CV LAB;  Service: Cardiovascular;  Laterality: N/A;   ESOPHAGOGASTRODUODENOSCOPY (EGD) WITH PROPOFOL  N/A 11/07/2017   Procedure: ESOPHAGOGASTRODUODENOSCOPY (EGD) WITH PROPOFOL ;  Surgeon: Therisa Bi, MD;  Location: [facility] ENDOSCOPY;  Service: Gastroenterology;  Laterality: N/A;   LEFT HEART CATH AND CORONARY ANGIOGRAPHY N/A 10/23/2017   Procedure: LEFT HEART CATH AND CORONARY ANGIOGRAPHY;  Surgeon: Ammon Blunt, MD;  Location: [facility] INVASIVE CV LAB;  Service: Cardiovascular;  Laterality: N/A;    Family History  Problem Relation Age of Onset   Cancer Mother    CVA Father    Heart disease  Father     Allergies  Allergen Reactions   Atorvastatin  Other (See Comments)    Other reaction(s): Not available Other reaction(s): Other (See Comments)    Red Dye #17 (Toney Red) Other (See Comments)   Red Dye #40 (Allura Red) Other (See Comments)    not as allergic to it as I used to be. I can take a little  of it       Latest Ref Rng & Units 07/15/2023    4:25 AM 07/14/2023    4:29 AM 07/13/2023    4:20 AM  CBC  WBC 4.0 - 10.5 K/uL 17.3  17.4  15.8   Hemoglobin 13.0 - 17.0 g/dL 88.4  89.1  89.0   Hematocrit 39.0 - 52.0 % 35.5  33.1  33.5   Platelets 150 - 400 K/uL 290  272  282       CMP     Component Value Date/Time   NA 131 (L) 07/15/2023 0425   NA 137 10/21/2020 1102   K 3.3 (L) 07/15/2023 0425   CL 91 (L) 07/15/2023 0425   CO2 28 07/15/2023 0425   GLUCOSE 289 (H) 07/15/2023 0425   BUN 66 (H) 07/15/2023 0425   BUN 35 (H) 10/21/2020 1102   CREATININE 2.65 (H) 07/15/2023 0425   CALCIUM  8.9 07/15/2023 0425   PROT 7.3 05/25/2023 0631   PROT 7.0 10/21/2020 1102   ALBUMIN  3.3 (L) 05/25/2023 0631   ALBUMIN  3.8 10/21/2020 1102   AST 112 (H) 05/25/2023 0631   ALT 76 (H) 05/25/2023 0631   ALKPHOS 90 05/25/2023 0631   BILITOT 0.4 05/25/2023 0631   BILITOT 0.3 10/21/2020 1102   EGFR 29 (L) 10/21/2020 1102   GFRNONAA 25 (L) 07/15/2023 0425     No results found.     Assessment & Plan:   1. Lymphedema (Primary) No surgery or intervention at this point in time.     I have had a long discussion with the patient regarding venous insufficiency and why it  causes symptoms, specifically venous ulceration. I have discussed with the patient the chronic skin changes that accompany venous insufficiency and the long term sequela such as infection and recurring  ulceration.  Patient will be placed in [employer] which will be changed weekly by home health for another 4 weeks drainage permitting.   - Ambulatory referral to [facility], he continues to have difficulty with  obtaining services.  He states that they showed up once after he was last here they did not change the wraps to his legs on that day and they have never returned since.  I will contact the home health company the first of next week to find out what the progress is.  They may have discharged him due to noncompliance or being unable to reach him.  Once have contacted the home health company I will make contact with Mr. Buss again to determine a plan of care.    I had a long discussion regarding Home Health changing his wraps and not his girl friend. He was counseled to let home health see him and if he refusing going forward he will be discharged from [facility] and not get supplies. He was informed he would have to supply his own unna boot supplies going forward.   2. Essential hypertension Continue antihypertensive medications as already ordered, these medications have been reviewed and there are no changes at this time.   3. Chronic diastolic congestive heart failure (HCC) This could also contribute to the patient's lower extremity edema. Patient counseled to be aware of his diet limiting salt and take any medications to help control CH    Current Outpatient Medications on File Prior to Visit  Medication Sig Dispense Refill   albuterol  (VENTOLIN  HFA) 108 (90 Base) MCG/ACT inhaler Inhale 1-2 puffs into the lungs every 6 (six) hours as needed for wheezing  or shortness of breath. 1 each 5   allopurinol  (ZYLOPRIM ) 100 MG tablet TAKE ONE TABLET (100 MG) BY MOUTH ONCE DAILY 90 tablet 1   amiodarone  (PACERONE ) 200 MG tablet Take 1 tablet (200 mg total) by mouth daily. 30 tablet 0   aspirin  EC 81 MG tablet Take 81 mg by mouth daily. Swallow whole.     cefadroxil  (DURICEF) 500 MG capsule Take 1 capsule (500 mg total) by mouth 2 (two) times daily. 60 capsule 0   colchicine  0.6 MG tablet Take 0.6 mg by mouth daily.     Continuous Glucose Sensor (FREESTYLE LIBRE 3 PLUS SENSOR) MISC       diphenhydrAMINE  (BENADRYL ) 25 mg capsule Take 25 mg by mouth every 6 (six) hours as needed for itching or allergies.     glimepiride (AMARYL) 1 MG tablet Take 1 mg by mouth daily with breakfast.     insulin  glargine, 2 Unit Dial, (TOUJEO  MAX) 300 UNIT/ML Solostar Pen Inject 80 Units into the skin.     ipratropium (ATROVENT ) 0.02 % nebulizer solution Take 2.5 mLs (0.5 mg total) by nebulization every 6 (six) hours as needed for wheezing or shortness of breath. 225 mL 1   JANUVIA 50 MG tablet Take 50 mg by mouth.     lactulose (CHRONULAC) 10 GM/15ML solution Take 10 g by mouth 2 (two) times daily as needed for mild constipation or moderate constipation.     lactulose, encephalopathy, (CHRONULAC) 10 GM/15ML SOLN Take 15 mLs by mouth.     levothyroxine  (SYNTHROID ) 112 MCG tablet Take 112 mcg by mouth daily before breakfast.     loratadine  (CLARITIN ) 10 MG tablet Take 10 mg by mouth daily.      multivitamin (ONE-A-DAY MEN'S) TABS tablet Take 1 tablet by mouth daily.     mupirocin  ointment (BACTROBAN ) 2 % Apply 1 Application topically 2 (two) times daily. Apply to the back BID 22 g 0   nitroGLYCERIN  (NITROSTAT ) 0.4 MG SL tablet Place 1 tablet (0.4 mg total) under the tongue every 5 (five) minutes as needed for chest pain. 30 tablet 0   oxycodone  (OXY-IR) 5 MG capsule Take 5 mg by mouth every 6 (six) hours as needed for pain.     spironolactone  (ALDACTONE ) 25 MG tablet Take 25 mg by mouth daily.     torsemide  (DEMADEX ) 20 MG tablet Take 80 mg by mouth daily.     doxycycline  (VIBRA -TABS) 100 MG tablet Take 100 mg by mouth 2 (two) times daily. (Patient not taking: Reported on 04/12/2024)     sucralfate  (CARAFATE ) 1 GM/10ML suspension  (Patient not taking: Reported on 04/12/2024)     TRESIBA  FLEXTOUCH 100 UNIT/ML FlexTouch Pen 120 Units 2 (two) times daily. (Patient not taking: Reported on 04/12/2024)     Current Facility-Administered Medications on File Prior to Visit  Medication Dose Route Frequency Provider  Last Rate Last Admin   lidocaine -EPINEPHrine  (PF) (XYLOCAINE -EPINEPHrine ) 1 %-1:200000 (PF) injection    PRN Dew, Jason S, MD   20 mL at 05/26/23 1648    There are no Patient Instructions on file for this visit. No follow-ups on file.   Gwendlyn JONELLE Shank, NP

## 2024-04-19 ENCOUNTER — Ambulatory Visit (INDEPENDENT_AMBULATORY_CARE_PROVIDER_SITE_OTHER): Admitting: Nurse Practitioner

## 2024-04-19 ENCOUNTER — Encounter (INDEPENDENT_AMBULATORY_CARE_PROVIDER_SITE_OTHER): Payer: Self-pay | Admitting: Nurse Practitioner

## 2024-04-19 VITALS — BP 117/71 | HR 87 | Resp 18

## 2024-04-19 DIAGNOSIS — I890 Lymphedema, not elsewhere classified: Secondary | ICD-10-CM | POA: Diagnosis not present

## 2024-04-19 NOTE — Progress Notes (Signed)
 History of Present Illness  There is no documented history at this time  Assessments & Plan   There are no diagnoses linked to this encounter.    Additional instructions  Subjective:  Patient presents with venous ulcer of the Bilateral lower extremity.    Procedure:  3 layer unna wrap was placed Bilateral lower extremity.   Plan:   Follow up in one week.

## 2024-04-22 ENCOUNTER — Encounter (INDEPENDENT_AMBULATORY_CARE_PROVIDER_SITE_OTHER): Payer: Self-pay | Admitting: Nurse Practitioner

## 2024-04-24 ENCOUNTER — Other Ambulatory Visit (INDEPENDENT_AMBULATORY_CARE_PROVIDER_SITE_OTHER): Payer: Self-pay | Admitting: Vascular Surgery

## 2024-04-24 ENCOUNTER — Telehealth (INDEPENDENT_AMBULATORY_CARE_PROVIDER_SITE_OTHER): Payer: Self-pay

## 2024-04-24 DIAGNOSIS — I890 Lymphedema, not elsewhere classified: Secondary | ICD-10-CM

## 2024-04-24 DIAGNOSIS — B351 Tinea unguium: Secondary | ICD-10-CM | POA: Diagnosis not present

## 2024-04-24 DIAGNOSIS — M2042 Other hammer toe(s) (acquired), left foot: Secondary | ICD-10-CM | POA: Diagnosis not present

## 2024-04-24 DIAGNOSIS — I739 Peripheral vascular disease, unspecified: Secondary | ICD-10-CM | POA: Diagnosis not present

## 2024-04-24 DIAGNOSIS — E1142 Type 2 diabetes mellitus with diabetic polyneuropathy: Secondary | ICD-10-CM | POA: Diagnosis not present

## 2024-04-24 DIAGNOSIS — L97521 Non-pressure chronic ulcer of other part of left foot limited to breakdown of skin: Secondary | ICD-10-CM | POA: Diagnosis not present

## 2024-04-24 DIAGNOSIS — L97511 Non-pressure chronic ulcer of other part of right foot limited to breakdown of skin: Secondary | ICD-10-CM | POA: Diagnosis not present

## 2024-04-24 DIAGNOSIS — M2041 Other hammer toe(s) (acquired), right foot: Secondary | ICD-10-CM | POA: Diagnosis not present

## 2024-04-24 NOTE — Telephone Encounter (Signed)
 Referral has been accepted by [facility] home health to start weekly bilateral unna wraps. Patient has been notified with information.

## 2024-04-26 ENCOUNTER — Encounter (INDEPENDENT_AMBULATORY_CARE_PROVIDER_SITE_OTHER)

## 2024-04-26 DIAGNOSIS — M109 Gout, unspecified: Secondary | ICD-10-CM | POA: Diagnosis not present

## 2024-04-26 DIAGNOSIS — I890 Lymphedema, not elsewhere classified: Secondary | ICD-10-CM | POA: Diagnosis not present

## 2024-04-26 DIAGNOSIS — K219 Gastro-esophageal reflux disease without esophagitis: Secondary | ICD-10-CM | POA: Diagnosis not present

## 2024-04-26 DIAGNOSIS — E119 Type 2 diabetes mellitus without complications: Secondary | ICD-10-CM | POA: Diagnosis not present

## 2024-04-26 DIAGNOSIS — J449 Chronic obstructive pulmonary disease, unspecified: Secondary | ICD-10-CM | POA: Diagnosis not present

## 2024-04-26 DIAGNOSIS — L97929 Non-pressure chronic ulcer of unspecified part of left lower leg with unspecified severity: Secondary | ICD-10-CM | POA: Diagnosis not present

## 2024-04-26 DIAGNOSIS — L97919 Non-pressure chronic ulcer of unspecified part of right lower leg with unspecified severity: Secondary | ICD-10-CM | POA: Diagnosis not present

## 2024-04-26 DIAGNOSIS — I5032 Chronic diastolic (congestive) heart failure: Secondary | ICD-10-CM | POA: Diagnosis not present

## 2024-04-26 DIAGNOSIS — I110 Hypertensive heart disease with heart failure: Secondary | ICD-10-CM | POA: Diagnosis not present

## 2024-04-26 DIAGNOSIS — I872 Venous insufficiency (chronic) (peripheral): Secondary | ICD-10-CM | POA: Diagnosis not present

## 2024-05-03 ENCOUNTER — Encounter (INDEPENDENT_AMBULATORY_CARE_PROVIDER_SITE_OTHER)

## 2024-05-05 DIAGNOSIS — J449 Chronic obstructive pulmonary disease, unspecified: Secondary | ICD-10-CM | POA: Diagnosis not present

## 2024-05-05 DIAGNOSIS — R0902 Hypoxemia: Secondary | ICD-10-CM | POA: Diagnosis not present

## 2024-05-10 ENCOUNTER — Encounter (INDEPENDENT_AMBULATORY_CARE_PROVIDER_SITE_OTHER)

## 2024-05-17 ENCOUNTER — Ambulatory Visit (INDEPENDENT_AMBULATORY_CARE_PROVIDER_SITE_OTHER): Admitting: Vascular Surgery

## 2024-05-17 ENCOUNTER — Encounter (INDEPENDENT_AMBULATORY_CARE_PROVIDER_SITE_OTHER): Payer: Self-pay | Admitting: Vascular Surgery

## 2024-05-17 VITALS — BP 121/66 | HR 91 | Resp 17 | Ht 68.0 in | Wt 294.2 lb

## 2024-05-17 DIAGNOSIS — I890 Lymphedema, not elsewhere classified: Secondary | ICD-10-CM

## 2024-05-17 DIAGNOSIS — I10 Essential (primary) hypertension: Secondary | ICD-10-CM

## 2024-05-17 DIAGNOSIS — I5032 Chronic diastolic (congestive) heart failure: Secondary | ICD-10-CM | POA: Diagnosis not present

## 2024-05-17 DIAGNOSIS — S81801A Unspecified open wound, right lower leg, initial encounter: Secondary | ICD-10-CM

## 2024-05-22 ENCOUNTER — Encounter (INDEPENDENT_AMBULATORY_CARE_PROVIDER_SITE_OTHER): Payer: Self-pay | Admitting: Vascular Surgery

## 2024-05-22 DIAGNOSIS — E1122 Type 2 diabetes mellitus with diabetic chronic kidney disease: Secondary | ICD-10-CM | POA: Diagnosis not present

## 2024-05-22 DIAGNOSIS — E039 Hypothyroidism, unspecified: Secondary | ICD-10-CM | POA: Diagnosis not present

## 2024-05-22 DIAGNOSIS — E66813 Obesity, class 3: Secondary | ICD-10-CM | POA: Diagnosis not present

## 2024-05-22 DIAGNOSIS — N184 Chronic kidney disease, stage 4 (severe): Secondary | ICD-10-CM | POA: Diagnosis not present

## 2024-05-22 DIAGNOSIS — E11621 Type 2 diabetes mellitus with foot ulcer: Secondary | ICD-10-CM | POA: Diagnosis not present

## 2024-05-22 DIAGNOSIS — E1159 Type 2 diabetes mellitus with other circulatory complications: Secondary | ICD-10-CM | POA: Diagnosis not present

## 2024-05-22 DIAGNOSIS — L97509 Non-pressure chronic ulcer of other part of unspecified foot with unspecified severity: Secondary | ICD-10-CM | POA: Diagnosis not present

## 2024-05-22 DIAGNOSIS — Z794 Long term (current) use of insulin: Secondary | ICD-10-CM | POA: Diagnosis not present

## 2024-05-22 DIAGNOSIS — E1165 Type 2 diabetes mellitus with hyperglycemia: Secondary | ICD-10-CM | POA: Diagnosis not present

## 2024-05-22 NOTE — Progress Notes (Signed)
 Subjective:    Patient ID: Jason Hudson, male    DOB: 17-Jun-1951, 73 y.o.   MRN: [id number] Chief Complaint  Patient presents with   Follow-up    Jason Hudson is a 73 yo male who presents to clinic for follow up related to bilateral lower extremity swelling with [facility]. He continues to have swelling with +2 edema to his feet and toes. His lower extremities remain discolored. He now has an open wound to his right lower extremity. He states he hit it on something which tore his skin open. It is not weeping today and does not appear infected. He recently followed up with podiatry for toe ulceration on his third left toe. He is noted to have multiple blood blisters on his right foot as well. He is also noted to have Hgb A1c of 12.9%. Not controlling his Blood Glucose at all.     Review of Systems  Cardiovascular:  Positive for leg swelling.  Musculoskeletal:  Positive for myalgias.  Skin:  Positive for color change and wound.       Color Change to bilateral lower extremities  New wound to right lower extremity Shin. Skin tear.        Objective:   Physical Exam Vitals reviewed.  Constitutional:      Appearance: Normal appearance. He is obese.  HENT:     Head: Normocephalic.  Eyes:     Pupils: Pupils are equal, round, and reactive to light.  Cardiovascular:     Rate and Rhythm: Normal rate and regular rhythm.     Pulses: Normal pulses.     Heart sounds: Normal heart sounds.  Pulmonary:     Effort: Pulmonary effort is normal.     Breath sounds: Normal breath sounds.  Abdominal:     General: Bowel sounds are normal.     Palpations: Abdomen is soft.  Musculoskeletal:        General: Swelling, tenderness and signs of injury present.     Right lower leg: Edema present.     Left lower leg: Edema present.  Skin:    General: Skin is warm and dry.     Capillary Refill: Capillary refill takes 2 to 3 seconds.     Findings: Erythema present.  Neurological:      General: No focal deficit present.     Mental Status: He is alert and oriented to person, place, and time. Mental status is at baseline.  Psychiatric:        Mood and Affect: Mood normal.        Behavior: Behavior normal.        Thought Content: Thought content normal.        Judgment: Judgment normal.     BP 121/66   Pulse 91   Resp 17   Ht 5' 8 (1.727 m)   Wt 294 lb 3.2 oz (133.4 kg)   BMI 44.73 kg/m   Past Medical History:  Diagnosis Date   CHF (congestive heart failure) (HCC)    COPD (chronic obstructive pulmonary disease) (HCC)    Diabetes mellitus without complication (HCC)    Edema    leg swelling   Gastroesophageal reflux disease 12/15/2017   Gout    Hypertension    Melena 11/05/2017   Pneumonia 07/27/2016    Social History   Socioeconomic History   Marital status: Single    Spouse name: Not on file   Number of children: Not on file   Years  of education: Not on file   Highest education level: Not on file  Occupational History   Not on file  Tobacco Use   Smoking status: Never   Smokeless tobacco: Never  Substance and Sexual Activity   Alcohol use: Not Currently    Comment: ocassionally   Drug use: Never   Sexual activity: Not Currently  Other Topics Concern   Not on file  Social History Narrative   Not on file   Social Drivers of Health   Financial Resource Strain: Low Risk  (04/09/2024)   Received from [facility]   Overall Financial Resource Strain (CARDIA)    Difficulty of Paying Living Expenses: Not hard at all  Food Insecurity: No Food Insecurity (04/09/2024)   Received from [facility]   Hunger Vital Sign    Within the past 12 months, you worried that your food would run out before you got the money to buy more.: Never true    Within the past 12 months, the food you bought just didn't last and you didn't have money to get more.: Never true  Transportation Needs: No Transportation Needs (04/09/2024)    Received from [facility] - Transportation    In the past 12 months, has lack of transportation kept you from medical appointments or from getting medications?: No    Lack of Transportation (Non-Medical): No  Physical Activity: Not on file  Stress: Not on file  Social Connections: Not on file  Intimate Partner Violence: Not At Risk (07/17/2023)   Humiliation, Afraid, Rape, and Kick questionnaire    Fear of Current or Ex-Partner: No    Emotionally Abused: No    Physically Abused: No    Sexually Abused: No    Past Surgical History:  Procedure Laterality Date   CENTRAL LINE INSERTION-TUNNELED N/A 04/21/2023   Procedure: CENTRAL LINE INSERTION-TUNNELED;  Surgeon: Jama, Cordella MATSU, MD;  Location: [facility] INVASIVE CV LAB;  Service: Cardiovascular;  Laterality: N/A;   COLONOSCOPY WITH PROPOFOL  N/A 11/07/2017   Procedure: COLONOSCOPY WITH PROPOFOL ;  Surgeon: Therisa Bi, MD;  Location: [facility] ENDOSCOPY;  Service: Gastroenterology;  Laterality: N/A;   DIALYSIS/PERMA CATHETER REMOVAL N/A 05/26/2023   Procedure: DIALYSIS/PERMA CATHETER REMOVAL;  Surgeon: Marea Selinda RAMAN, MD;  Location: [facility] INVASIVE CV LAB;  Service: Cardiovascular;  Laterality: N/A;   ESOPHAGOGASTRODUODENOSCOPY (EGD) WITH PROPOFOL  N/A 11/07/2017   Procedure: ESOPHAGOGASTRODUODENOSCOPY (EGD) WITH PROPOFOL ;  Surgeon: Therisa Bi, MD;  Location: [facility] ENDOSCOPY;  Service: Gastroenterology;  Laterality: N/A;   LEFT HEART CATH AND CORONARY ANGIOGRAPHY N/A 10/23/2017   Procedure: LEFT HEART CATH AND CORONARY ANGIOGRAPHY;  Surgeon: Ammon Blunt, MD;  Location: [facility] INVASIVE CV LAB;  Service: Cardiovascular;  Laterality: N/A;    Family History  Problem Relation Age of Onset   Cancer Mother    CVA Father    Heart disease Father     Allergies  Allergen Reactions   Atorvastatin  Other (See Comments)    Other reaction(s): Not available Other reaction(s): Other (See Comments)    Red Dye #17 (Toney Red) Other  (See Comments)   Red Dye #40 (Allura Red) Other (See Comments)    not as allergic to it as I used to be. I can take a little of it       Latest Ref Rng & Units 07/15/2023    4:25 AM 07/14/2023    4:29 AM 07/13/2023    4:20 AM  CBC  WBC 4.0 - 10.5 K/uL  17.3  17.4  15.8   Hemoglobin 13.0 - 17.0 g/dL 88.4  89.1  89.0   Hematocrit 39.0 - 52.0 % 35.5  33.1  33.5   Platelets 150 - 400 K/uL 290  272  282       CMP     Component Value Date/Time   NA 131 (L) 07/15/2023 0425   NA 137 10/21/2020 1102   K 3.3 (L) 07/15/2023 0425   CL 91 (L) 07/15/2023 0425   CO2 28 07/15/2023 0425   GLUCOSE 289 (H) 07/15/2023 0425   BUN 66 (H) 07/15/2023 0425   BUN 35 (H) 10/21/2020 1102   CREATININE 2.65 (H) 07/15/2023 0425   CALCIUM  8.9 07/15/2023 0425   PROT 7.3 05/25/2023 0631   PROT 7.0 10/21/2020 1102   ALBUMIN  3.3 (L) 05/25/2023 0631   ALBUMIN  3.8 10/21/2020 1102   AST 112 (H) 05/25/2023 0631   ALT 76 (H) 05/25/2023 0631   ALKPHOS 90 05/25/2023 0631   BILITOT 0.4 05/25/2023 0631   BILITOT 0.3 10/21/2020 1102   EGFR 29 (L) 10/21/2020 1102   GFRNONAA 25 (L) 07/15/2023 0425     No results found.     Assessment & Plan:   1. Lymphedema (Primary) Patient will continue with [facility] wraps for another 4 weeks with follow up on week 5 for evaluation of bilateral lower extremity swelling and right lower extremity wound/skin tear.   Patient was counseled on elevated blood sugars and how this will interfere with healing and create leg swelling.   2. Essential hypertension Continue antihypertensive medications as already ordered, these medications have been reviewed and there are no changes at this time.  3. Chronic diastolic congestive heart failure (HCC) Continue cardiac and antihypertensive medications as already ordered and reviewed, no changes at this time.  Continue statin as ordered and reviewed, no changes at this time  Nitrates PRN for chest pain  4. Open wound of right  lower extremity, initial encounter New open wound/skin tear to right lower extremity shin. Patient will be placed in [facility] wraps to help reduce swelling and assist healing.    Current Outpatient Medications on File Prior to Visit  Medication Sig Dispense Refill   albuterol  (VENTOLIN  HFA) 108 (90 Base) MCG/ACT inhaler Inhale 1-2 puffs into the lungs every 6 (six) hours as needed for wheezing or shortness of breath. 1 each 5   allopurinol  (ZYLOPRIM ) 100 MG tablet TAKE ONE TABLET (100 MG) BY MOUTH ONCE DAILY 90 tablet 1   amiodarone  (PACERONE ) 200 MG tablet Take 1 tablet (200 mg total) by mouth daily. 30 tablet 0   aspirin  EC 81 MG tablet Take 81 mg by mouth daily. Swallow whole.     cefadroxil  (DURICEF) 500 MG capsule Take 1 capsule (500 mg total) by mouth 2 (two) times daily. 60 capsule 0   colchicine  0.6 MG tablet Take 0.6 mg by mouth daily.     Continuous Glucose Sensor (FREESTYLE LIBRE 3 PLUS SENSOR) MISC      diphenhydrAMINE  (BENADRYL ) 25 mg capsule Take 25 mg by mouth every 6 (six) hours as needed for itching or allergies.     doxycycline  (VIBRA -TABS) 100 MG tablet Take 100 mg by mouth 2 (two) times daily.     glimepiride (AMARYL) 1 MG tablet Take 1 mg by mouth daily with breakfast.     insulin  glargine, 2 Unit Dial, (TOUJEO  MAX) 300 UNIT/ML Solostar Pen Inject 80 Units into the skin.     ipratropium (ATROVENT ) 0.02 %  nebulizer solution Take 2.5 mLs (0.5 mg total) by nebulization every 6 (six) hours as needed for wheezing or shortness of breath. 225 mL 1   JANUVIA 50 MG tablet Take 50 mg by mouth.     lactulose (CHRONULAC) 10 GM/15ML solution Take 10 g by mouth 2 (two) times daily as needed for mild constipation or moderate constipation.     lactulose, encephalopathy, (CHRONULAC) 10 GM/15ML SOLN Take 15 mLs by mouth.     levothyroxine  (SYNTHROID ) 112 MCG tablet Take 112 mcg by mouth daily before breakfast.     loratadine  (CLARITIN ) 10 MG tablet Take 10 mg by mouth daily.       multivitamin (ONE-A-DAY MEN'S) TABS tablet Take 1 tablet by mouth daily.     mupirocin  ointment (BACTROBAN ) 2 % Apply 1 Application topically 2 (two) times daily. Apply to the back BID 22 g 0   nitroGLYCERIN  (NITROSTAT ) 0.4 MG SL tablet Place 1 tablet (0.4 mg total) under the tongue every 5 (five) minutes as needed for chest pain. 30 tablet 0   oxycodone  (OXY-IR) 5 MG capsule Take 5 mg by mouth every 6 (six) hours as needed for pain.     spironolactone  (ALDACTONE ) 25 MG tablet Take 25 mg by mouth daily.     torsemide  (DEMADEX ) 20 MG tablet Take 80 mg by mouth daily.     sucralfate  (CARAFATE ) 1 GM/10ML suspension  (Patient not taking: Reported on 05/17/2024)     TRESIBA  FLEXTOUCH 100 UNIT/ML FlexTouch Pen 120 Units 2 (two) times daily. (Patient not taking: Reported on 05/17/2024)     Current Facility-Administered Medications on File Prior to Visit  Medication Dose Route Frequency Provider Last Rate Last Admin   lidocaine -EPINEPHrine  (PF) (XYLOCAINE -EPINEPHrine ) 1 %-1:200000 (PF) injection    PRN Dew, Jason S, MD   20 mL at 05/26/23 1648    There are no Patient Instructions on file for this visit. No follow-ups on file.   Gwendlyn JONELLE Shank, NP

## 2024-05-23 DIAGNOSIS — I5189 Other ill-defined heart diseases: Secondary | ICD-10-CM | POA: Diagnosis not present

## 2024-05-23 DIAGNOSIS — Z6841 Body Mass Index (BMI) 40.0 and over, adult: Secondary | ICD-10-CM | POA: Diagnosis not present

## 2024-05-23 DIAGNOSIS — I480 Paroxysmal atrial fibrillation: Secondary | ICD-10-CM | POA: Diagnosis not present

## 2024-05-23 DIAGNOSIS — I10 Essential (primary) hypertension: Secondary | ICD-10-CM | POA: Diagnosis not present

## 2024-05-23 DIAGNOSIS — G4733 Obstructive sleep apnea (adult) (pediatric): Secondary | ICD-10-CM | POA: Diagnosis not present

## 2024-05-23 DIAGNOSIS — R600 Localized edema: Secondary | ICD-10-CM | POA: Diagnosis not present

## 2024-05-23 DIAGNOSIS — I5032 Chronic diastolic (congestive) heart failure: Secondary | ICD-10-CM | POA: Diagnosis not present

## 2024-05-23 DIAGNOSIS — E119 Type 2 diabetes mellitus without complications: Secondary | ICD-10-CM | POA: Diagnosis not present

## 2024-05-23 DIAGNOSIS — I2510 Atherosclerotic heart disease of native coronary artery without angina pectoris: Secondary | ICD-10-CM | POA: Diagnosis not present

## 2024-05-23 DIAGNOSIS — E66813 Obesity, class 3: Secondary | ICD-10-CM | POA: Diagnosis not present

## 2024-05-23 DIAGNOSIS — Z794 Long term (current) use of insulin: Secondary | ICD-10-CM | POA: Diagnosis not present

## 2024-05-23 DIAGNOSIS — N184 Chronic kidney disease, stage 4 (severe): Secondary | ICD-10-CM | POA: Diagnosis not present

## 2024-05-27 DIAGNOSIS — E1142 Type 2 diabetes mellitus with diabetic polyneuropathy: Secondary | ICD-10-CM | POA: Diagnosis not present

## 2024-05-27 DIAGNOSIS — L97521 Non-pressure chronic ulcer of other part of left foot limited to breakdown of skin: Secondary | ICD-10-CM | POA: Diagnosis not present

## 2024-05-27 DIAGNOSIS — L97511 Non-pressure chronic ulcer of other part of right foot limited to breakdown of skin: Secondary | ICD-10-CM | POA: Diagnosis not present

## 2024-06-04 DIAGNOSIS — E119 Type 2 diabetes mellitus without complications: Secondary | ICD-10-CM | POA: Diagnosis not present

## 2024-06-04 DIAGNOSIS — Z794 Long term (current) use of insulin: Secondary | ICD-10-CM | POA: Diagnosis not present

## 2024-06-04 DIAGNOSIS — I5032 Chronic diastolic (congestive) heart failure: Secondary | ICD-10-CM | POA: Diagnosis not present

## 2024-06-04 DIAGNOSIS — R0902 Hypoxemia: Secondary | ICD-10-CM | POA: Diagnosis not present

## 2024-06-05 DIAGNOSIS — G4733 Obstructive sleep apnea (adult) (pediatric): Secondary | ICD-10-CM | POA: Diagnosis not present

## 2024-06-05 DIAGNOSIS — J984 Other disorders of lung: Secondary | ICD-10-CM | POA: Diagnosis not present

## 2024-06-05 DIAGNOSIS — E66813 Obesity, class 3: Secondary | ICD-10-CM | POA: Diagnosis not present

## 2024-06-05 DIAGNOSIS — J4489 Other specified chronic obstructive pulmonary disease: Secondary | ICD-10-CM | POA: Diagnosis not present

## 2024-06-05 DIAGNOSIS — J449 Chronic obstructive pulmonary disease, unspecified: Secondary | ICD-10-CM | POA: Diagnosis not present

## 2024-06-05 DIAGNOSIS — J301 Allergic rhinitis due to pollen: Secondary | ICD-10-CM | POA: Diagnosis not present

## 2024-06-12 DIAGNOSIS — E1169 Type 2 diabetes mellitus with other specified complication: Secondary | ICD-10-CM | POA: Diagnosis not present

## 2024-06-12 DIAGNOSIS — I509 Heart failure, unspecified: Secondary | ICD-10-CM | POA: Diagnosis not present

## 2024-06-12 DIAGNOSIS — N184 Chronic kidney disease, stage 4 (severe): Secondary | ICD-10-CM | POA: Diagnosis not present

## 2024-06-12 DIAGNOSIS — I480 Paroxysmal atrial fibrillation: Secondary | ICD-10-CM | POA: Diagnosis not present

## 2024-06-12 DIAGNOSIS — I2510 Atherosclerotic heart disease of native coronary artery without angina pectoris: Secondary | ICD-10-CM | POA: Diagnosis not present

## 2024-06-12 DIAGNOSIS — E785 Hyperlipidemia, unspecified: Secondary | ICD-10-CM | POA: Diagnosis not present

## 2024-06-12 DIAGNOSIS — I890 Lymphedema, not elsewhere classified: Secondary | ICD-10-CM | POA: Diagnosis not present

## 2024-06-12 DIAGNOSIS — E66813 Obesity, class 3: Secondary | ICD-10-CM | POA: Diagnosis not present

## 2024-06-12 DIAGNOSIS — R600 Localized edema: Secondary | ICD-10-CM | POA: Diagnosis not present

## 2024-06-12 DIAGNOSIS — I10 Essential (primary) hypertension: Secondary | ICD-10-CM | POA: Diagnosis not present

## 2024-06-12 DIAGNOSIS — E119 Type 2 diabetes mellitus without complications: Secondary | ICD-10-CM | POA: Diagnosis not present

## 2024-06-12 DIAGNOSIS — G4733 Obstructive sleep apnea (adult) (pediatric): Secondary | ICD-10-CM | POA: Diagnosis not present

## 2024-06-17 DIAGNOSIS — E1142 Type 2 diabetes mellitus with diabetic polyneuropathy: Secondary | ICD-10-CM | POA: Diagnosis not present

## 2024-06-17 DIAGNOSIS — L97511 Non-pressure chronic ulcer of other part of right foot limited to breakdown of skin: Secondary | ICD-10-CM | POA: Diagnosis not present

## 2024-06-17 DIAGNOSIS — I739 Peripheral vascular disease, unspecified: Secondary | ICD-10-CM | POA: Diagnosis not present

## 2024-06-21 ENCOUNTER — Encounter (INDEPENDENT_AMBULATORY_CARE_PROVIDER_SITE_OTHER): Payer: Self-pay | Admitting: Vascular Surgery

## 2024-06-21 ENCOUNTER — Ambulatory Visit (INDEPENDENT_AMBULATORY_CARE_PROVIDER_SITE_OTHER): Admitting: Vascular Surgery

## 2024-06-21 VITALS — BP 131/68 | HR 87 | Resp 16 | Ht 68.0 in | Wt 294.2 lb

## 2024-06-21 DIAGNOSIS — I5032 Chronic diastolic (congestive) heart failure: Secondary | ICD-10-CM | POA: Diagnosis not present

## 2024-06-21 DIAGNOSIS — I890 Lymphedema, not elsewhere classified: Secondary | ICD-10-CM

## 2024-06-21 DIAGNOSIS — I10 Essential (primary) hypertension: Secondary | ICD-10-CM | POA: Diagnosis not present

## 2024-06-21 NOTE — Progress Notes (Signed)
 Subjective:    Patient ID: Jason Hudson, male    DOB: [birth year], 73 y.o.   MRN: [id number] Chief Complaint  Patient presents with   Follow-up    fu 5 weeks    Izekiel Flegel is a 73 yo male who presents to clinic for follow up related to bilateral lower extremity swelling with [employer]. He continues to have swelling with +2 edema to his feet and toes. His lower extremities remain discolored. He now has an open wound to his right lower extremity. He states he hit it on something which tore his skin open. It is not weeping today and does not appear infected. He recently followed up with podiatry for toe ulceration on his third left toe. He is noted to have multiple blood blisters on his right foot as well. He is also noted to have Hgb A1c of 12.9%. Not controlling his Blood Glucose at all.      Review of Systems  Constitutional: Negative.   Cardiovascular:  Positive for leg swelling.  Musculoskeletal:  Positive for myalgias.  Skin:  Positive for color change and wound.  All other systems reviewed and are negative.      Objective:   Physical Exam Vitals reviewed.  Constitutional:      Appearance: Normal appearance. He is obese.  HENT:     Head: Normocephalic.  Eyes:     Pupils: Pupils are equal, round, and reactive to light.  Cardiovascular:     Rate and Rhythm: Normal rate and regular rhythm.     Pulses: Normal pulses.     Heart sounds: Normal heart sounds.  Pulmonary:     Effort: Pulmonary effort is normal.     Breath sounds: Normal breath sounds.  Abdominal:     General: Bowel sounds are normal.     Palpations: Abdomen is soft.  Musculoskeletal:        General: Swelling and signs of injury present.     Right lower leg: Edema present.     Left lower leg: Edema present.  Skin:    General: Skin is warm and dry.     Capillary Refill: Capillary refill takes 2 to 3 seconds.  Neurological:     General: No focal deficit present.     Mental Status: He is  alert and oriented to person, place, and time. Mental status is at baseline.  Psychiatric:        Mood and Affect: Mood normal.        Behavior: Behavior normal.        Thought Content: Thought content normal.        Judgment: Judgment normal.     BP 131/68   Pulse 87   Resp 16   Ht 5' 8 (1.727 m)   Wt 294 lb 3.2 oz (133.4 kg)   BMI 44.73 kg/m   Past Medical History:  Diagnosis Date   CHF (congestive heart failure) (HCC)    COPD (chronic obstructive pulmonary disease) (HCC)    Diabetes mellitus without complication (HCC)    Edema    leg swelling   Gastroesophageal reflux disease 12/15/2017   Gout    Hypertension    Melena 11/05/2017   Pneumonia 07/27/2016    Social History   Socioeconomic History   Marital status: Single    Spouse name: Not on file   Number of children: Not on file   Years of education: Not on file   Highest education level: Not on file  Occupational History   Not on file  Tobacco Use   Smoking status: Never   Smokeless tobacco: Never  Substance and Sexual Activity   Alcohol use: Not Currently    Comment: ocassionally   Drug use: Never   Sexual activity: Not Currently  Other Topics Concern   Not on file  Social History Narrative   Not on file   Social Drivers of Health   Financial Resource Strain: Low Risk  (04/09/2024)   Received from [facility]   Overall Financial Resource Strain (CARDIA)    Difficulty of Paying Living Expenses: Not hard at all  Food Insecurity: No Food Insecurity (04/09/2024)   Received from [facility]   Hunger Vital Sign    Within the past 12 months, you worried that your food would run out before you got the money to buy more.: Never true    Within the past 12 months, the food you bought just didn't last and you didn't have money to get more.: Never true  Transportation Needs: No Transportation Needs (04/09/2024)   Received from [facility] -  Transportation    In the past 12 months, has lack of transportation kept you from medical appointments or from getting medications?: No    Lack of Transportation (Non-Medical): No  Physical Activity: Not on file  Stress: Not on file  Social Connections: Not on file  Intimate Partner Violence: Not At Risk (07/17/2023)   Humiliation, Afraid, Rape, and Kick questionnaire    Fear of Current or Ex-Partner: No    Emotionally Abused: No    Physically Abused: No    Sexually Abused: No    Past Surgical History:  Procedure Laterality Date   CENTRAL LINE INSERTION-TUNNELED N/A 04/21/2023   Procedure: CENTRAL LINE INSERTION-TUNNELED;  Surgeon: Jama, Cordella MATSU, MD;  Location: [facility] INVASIVE CV LAB;  Service: Cardiovascular;  Laterality: N/A;   COLONOSCOPY WITH PROPOFOL  N/A 11/07/2017   Procedure: COLONOSCOPY WITH PROPOFOL ;  Surgeon: Therisa Bi, MD;  Location: [facility] ENDOSCOPY;  Service: Gastroenterology;  Laterality: N/A;   DIALYSIS/PERMA CATHETER REMOVAL N/A 05/26/2023   Procedure: DIALYSIS/PERMA CATHETER REMOVAL;  Surgeon: Marea Selinda RAMAN, MD;  Location: [facility] INVASIVE CV LAB;  Service: Cardiovascular;  Laterality: N/A;   ESOPHAGOGASTRODUODENOSCOPY (EGD) WITH PROPOFOL  N/A 11/07/2017   Procedure: ESOPHAGOGASTRODUODENOSCOPY (EGD) WITH PROPOFOL ;  Surgeon: Therisa Bi, MD;  Location: [facility] ENDOSCOPY;  Service: Gastroenterology;  Laterality: N/A;   LEFT HEART CATH AND CORONARY ANGIOGRAPHY N/A 10/23/2017   Procedure: LEFT HEART CATH AND CORONARY ANGIOGRAPHY;  Surgeon: Ammon Blunt, MD;  Location: [facility] INVASIVE CV LAB;  Service: Cardiovascular;  Laterality: N/A;    Family History  Problem Relation Age of Onset   Cancer Mother    CVA Father    Heart disease Father     Allergies  Allergen Reactions   Atorvastatin  Other (See Comments)    Other reaction(s): Not available Other reaction(s): Other (See Comments)    Red Dye #17 (Toney Red) Other (See Comments)   Red Dye #40 (Allura Red) Other (See  Comments)    not as allergic to it as I used to be. I can take a little of it       Latest Ref Rng & Units 07/15/2023    4:25 AM 07/14/2023    4:29 AM 07/13/2023    4:20 AM  CBC  WBC 4.0 - 10.5 K/uL 17.3  17.4  15.8   Hemoglobin 13.0 - 17.0 g/dL 11.5  10.8  10.9   Hematocrit 39.0 - 52.0 % 35.5  33.1  33.5   Platelets 150 - 400 K/uL 290  272  282       CMP     Component Value Date/Time   NA 131 (L) 07/15/2023 0425   NA 137 10/21/2020 1102   K 3.3 (L) 07/15/2023 0425   CL 91 (L) 07/15/2023 0425   CO2 28 07/15/2023 0425   GLUCOSE 289 (H) 07/15/2023 0425   BUN 66 (H) 07/15/2023 0425   BUN 35 (H) 10/21/2020 1102   CREATININE 2.65 (H) 07/15/2023 0425   CALCIUM  8.9 07/15/2023 0425   PROT 7.3 05/25/2023 0631   PROT 7.0 10/21/2020 1102   ALBUMIN  3.3 (L) 05/25/2023 0631   ALBUMIN  3.8 10/21/2020 1102   AST 112 (H) 05/25/2023 0631   ALT 76 (H) 05/25/2023 0631   ALKPHOS 90 05/25/2023 0631   BILITOT 0.4 05/25/2023 0631   BILITOT 0.3 10/21/2020 1102   EGFR 29 (L) 10/21/2020 1102   GFRNONAA 25 (L) 07/15/2023 0425     No results found.     Assessment & Plan:   1. Lymphedema (Primary) Patient will continue with [facility] wraps for another 4 weeks with follow up on week 5 for evaluation of bilateral lower extremity swelling and right lower extremity wound/skin tear.   Patient was counseled today regarding removing the wraps in the middle of the week without calling home health to rewrap him.  Patient was instructed he needs the wraps to be on 7 days a week and then changed to or he will not be getting any benefit from the [facility] wraps.  If he continues to do this he will be discharged from our service and can find care somewhere else because I cannot control his lower extremity lymphedema with him removing the Unna boot wraps as he wishes.  2. Essential hypertension Continue antihypertensive medications as already ordered, these medications have been reviewed and there are  no changes at this time.  3. Chronic diastolic congestive heart failure (HCC) Continue cardiac and antihypertensive medications as already ordered and reviewed, no changes at this time.   Continue statin as ordered and reviewed, no changes at this time   Nitrates PRN for chest pain   Current Outpatient Medications on File Prior to Visit  Medication Sig Dispense Refill   albuterol  (VENTOLIN  HFA) 108 (90 Base) MCG/ACT inhaler Inhale 1-2 puffs into the lungs every 6 (six) hours as needed for wheezing or shortness of breath. 1 each 5   allopurinol  (ZYLOPRIM ) 100 MG tablet TAKE ONE TABLET (100 MG) BY MOUTH ONCE DAILY 90 tablet 1   amiodarone  (PACERONE ) 200 MG tablet Take 1 tablet (200 mg total) by mouth daily. 30 tablet 0   aspirin  EC 81 MG tablet Take 81 mg by mouth daily. Swallow whole.     cefadroxil  (DURICEF) 500 MG capsule Take 1 capsule (500 mg total) by mouth 2 (two) times daily. 60 capsule 0   colchicine  0.6 MG tablet Take 0.6 mg by mouth daily.     Continuous Glucose Sensor (FREESTYLE LIBRE 3 PLUS SENSOR) MISC      diphenhydrAMINE  (BENADRYL ) 25 mg capsule Take 25 mg by mouth every 6 (six) hours as needed for itching or allergies.     doxycycline  (VIBRA -TABS) 100 MG tablet Take 100 mg by mouth 2 (two) times daily.     glimepiride (AMARYL) 1 MG tablet Take 1 mg by mouth daily with breakfast.  insulin  glargine, 2 Unit Dial, (TOUJEO  MAX) 300 UNIT/ML Solostar Pen Inject 80 Units into the skin.     ipratropium (ATROVENT ) 0.02 % nebulizer solution Take 2.5 mLs (0.5 mg total) by nebulization every 6 (six) hours as needed for wheezing or shortness of breath. 225 mL 1   JANUVIA 50 MG tablet Take 50 mg by mouth.     lactulose (CHRONULAC) 10 GM/15ML solution Take 10 g by mouth 2 (two) times daily as needed for mild constipation or moderate constipation.     lactulose, encephalopathy, (CHRONULAC) 10 GM/15ML SOLN Take 15 mLs by mouth.     levothyroxine  (SYNTHROID ) 112 MCG tablet Take 112 mcg by  mouth daily before breakfast.     loratadine  (CLARITIN ) 10 MG tablet Take 10 mg by mouth daily.      multivitamin (ONE-A-DAY MEN'S) TABS tablet Take 1 tablet by mouth daily.     mupirocin  ointment (BACTROBAN ) 2 % Apply 1 Application topically 2 (two) times daily. Apply to the back BID 22 g 0   nitroGLYCERIN  (NITROSTAT ) 0.4 MG SL tablet Place 1 tablet (0.4 mg total) under the tongue every 5 (five) minutes as needed for chest pain. 30 tablet 0   oxycodone  (OXY-IR) 5 MG capsule Take 5 mg by mouth every 6 (six) hours as needed for pain.     spironolactone  (ALDACTONE ) 25 MG tablet Take 25 mg by mouth daily.     sucralfate  (CARAFATE ) 1 GM/10ML suspension  (Patient not taking: Reported on 05/17/2024)     torsemide  (DEMADEX ) 20 MG tablet Take 80 mg by mouth daily.     TRESIBA  FLEXTOUCH 100 UNIT/ML FlexTouch Pen 120 Units 2 (two) times daily. (Patient not taking: Reported on 05/17/2024)     Current Facility-Administered Medications on File Prior to Visit  Medication Dose Route Frequency Provider Last Rate Last Admin   lidocaine -EPINEPHrine  (PF) (XYLOCAINE -EPINEPHrine ) 1 %-1:200000 (PF) injection    PRN Dew, Jason S, MD   20 mL at 05/26/23 1648    There are no Patient Instructions on file for this visit. No follow-ups on file.   Gwendlyn JONELLE Shank, NP

## 2024-06-27 ENCOUNTER — Encounter (INDEPENDENT_AMBULATORY_CARE_PROVIDER_SITE_OTHER): Payer: Self-pay

## 2024-06-27 ENCOUNTER — Ambulatory Visit (INDEPENDENT_AMBULATORY_CARE_PROVIDER_SITE_OTHER): Admitting: Nurse Practitioner

## 2024-06-27 DIAGNOSIS — I890 Lymphedema, not elsewhere classified: Secondary | ICD-10-CM | POA: Diagnosis not present

## 2024-06-27 NOTE — Progress Notes (Signed)
 History of Present Illness  There is no documented history at this time  Assessments & Plan   There are no diagnoses linked to this encounter.    Additional instructions  Subjective:  Patient presents with venous ulcer of the Bilateral lower extremity.    Procedure:  3 layer unna wrap was placed Bilateral lower extremity.   Plan:   Follow up in one week.

## 2024-06-29 ENCOUNTER — Encounter (INDEPENDENT_AMBULATORY_CARE_PROVIDER_SITE_OTHER): Payer: Self-pay | Admitting: Nurse Practitioner

## 2024-07-04 ENCOUNTER — Encounter (INDEPENDENT_AMBULATORY_CARE_PROVIDER_SITE_OTHER)

## 2024-07-05 ENCOUNTER — Telehealth (INDEPENDENT_AMBULATORY_CARE_PROVIDER_SITE_OTHER): Payer: Self-pay

## 2024-07-05 DIAGNOSIS — R0902 Hypoxemia: Secondary | ICD-10-CM | POA: Diagnosis not present

## 2024-07-05 NOTE — Telephone Encounter (Signed)
 Patient left a message stating that his home health nurse has not been out for his weekly unna wraps. I spoke with Leopoldo and was informed that the patient insurance authorization is still pending. Patient will come in Monday to be rewrapped. Patient did state that he received a letter that his insurance approved the home health visits.[facility] will work on receiving authorization and will start next Monday.

## 2024-07-08 ENCOUNTER — Ambulatory Visit (INDEPENDENT_AMBULATORY_CARE_PROVIDER_SITE_OTHER): Admitting: Nurse Practitioner

## 2024-07-08 ENCOUNTER — Other Ambulatory Visit: Payer: Self-pay | Admitting: Nurse Practitioner

## 2024-07-08 ENCOUNTER — Encounter (INDEPENDENT_AMBULATORY_CARE_PROVIDER_SITE_OTHER): Payer: Self-pay

## 2024-07-08 VITALS — BP 153/84 | HR 80 | Resp 18 | Ht 68.0 in | Wt 286.0 lb

## 2024-07-08 DIAGNOSIS — I890 Lymphedema, not elsewhere classified: Secondary | ICD-10-CM

## 2024-07-08 NOTE — Progress Notes (Signed)
 History of Present Illness  There is no documented history at this time  Assessments & Plan   There are no diagnoses linked to this encounter.    Additional instructions  Subjective:  Patient presents with venous ulcer of the Bilateral lower extremity.    Procedure:  3 layer unna wrap was placed Bilateral lower extremity.   Plan:   Follow up in one week.

## 2024-07-10 DIAGNOSIS — I509 Heart failure, unspecified: Secondary | ICD-10-CM | POA: Diagnosis not present

## 2024-07-10 DIAGNOSIS — Z794 Long term (current) use of insulin: Secondary | ICD-10-CM | POA: Diagnosis not present

## 2024-07-10 DIAGNOSIS — N184 Chronic kidney disease, stage 4 (severe): Secondary | ICD-10-CM | POA: Diagnosis not present

## 2024-07-10 DIAGNOSIS — I129 Hypertensive chronic kidney disease with stage 1 through stage 4 chronic kidney disease, or unspecified chronic kidney disease: Secondary | ICD-10-CM | POA: Diagnosis not present

## 2024-07-10 DIAGNOSIS — R809 Proteinuria, unspecified: Secondary | ICD-10-CM | POA: Diagnosis not present

## 2024-07-10 DIAGNOSIS — E1122 Type 2 diabetes mellitus with diabetic chronic kidney disease: Secondary | ICD-10-CM | POA: Diagnosis not present

## 2024-07-10 DIAGNOSIS — N2581 Secondary hyperparathyroidism of renal origin: Secondary | ICD-10-CM | POA: Diagnosis not present

## 2024-07-11 ENCOUNTER — Encounter (INDEPENDENT_AMBULATORY_CARE_PROVIDER_SITE_OTHER)

## 2024-07-15 ENCOUNTER — Ambulatory Visit (INDEPENDENT_AMBULATORY_CARE_PROVIDER_SITE_OTHER): Admitting: Nurse Practitioner

## 2024-07-15 ENCOUNTER — Telehealth (INDEPENDENT_AMBULATORY_CARE_PROVIDER_SITE_OTHER): Payer: Self-pay

## 2024-07-15 VITALS — BP 147/83 | HR 80 | Resp 18 | Ht 68.0 in | Wt 290.0 lb

## 2024-07-15 DIAGNOSIS — I890 Lymphedema, not elsewhere classified: Secondary | ICD-10-CM | POA: Diagnosis not present

## 2024-07-15 NOTE — Telephone Encounter (Signed)
 Spoke to [name] with [facility] in reference to patient stating home health is not showing up to wrap his legs, Jason Hudson stated patient has [employer] and they are now requiring a pre-authorization for each  home health visit he has for unna wraps. She stated the authorization should be approved for them to go out next Monday 07/22/24 to change his unna wraps.

## 2024-07-15 NOTE — Telephone Encounter (Signed)
 Patient called at this time stating that home health did not show up Friday and he is in need of  his unna wrap changed at this time.

## 2024-07-15 NOTE — Progress Notes (Signed)
 History of Present Illness  There is no documented history at this time  Assessments & Plan   There are no diagnoses linked to this encounter.    Additional instructions  Subjective:  Patient presents with venous ulcer of the Bilateral lower extremity.    Procedure:  3 layer unna wrap was placed Bilateral lower extremity.   Plan:   Follow up in one week.

## 2024-07-17 ENCOUNTER — Encounter (INDEPENDENT_AMBULATORY_CARE_PROVIDER_SITE_OTHER)

## 2024-07-21 ENCOUNTER — Encounter (INDEPENDENT_AMBULATORY_CARE_PROVIDER_SITE_OTHER): Payer: Self-pay | Admitting: Nurse Practitioner

## 2024-07-25 ENCOUNTER — Encounter (INDEPENDENT_AMBULATORY_CARE_PROVIDER_SITE_OTHER)

## 2024-07-26 DIAGNOSIS — B351 Tinea unguium: Secondary | ICD-10-CM | POA: Diagnosis not present

## 2024-07-26 DIAGNOSIS — L851 Acquired keratosis [keratoderma] palmaris et plantaris: Secondary | ICD-10-CM | POA: Diagnosis not present

## 2024-07-26 DIAGNOSIS — E1142 Type 2 diabetes mellitus with diabetic polyneuropathy: Secondary | ICD-10-CM | POA: Diagnosis not present

## 2024-07-26 DIAGNOSIS — I739 Peripheral vascular disease, unspecified: Secondary | ICD-10-CM | POA: Diagnosis not present

## 2024-07-29 ENCOUNTER — Telehealth (INDEPENDENT_AMBULATORY_CARE_PROVIDER_SITE_OTHER): Payer: Self-pay

## 2024-07-29 ENCOUNTER — Ambulatory Visit (INDEPENDENT_AMBULATORY_CARE_PROVIDER_SITE_OTHER): Admitting: Nurse Practitioner

## 2024-07-29 ENCOUNTER — Encounter (INDEPENDENT_AMBULATORY_CARE_PROVIDER_SITE_OTHER): Payer: Self-pay

## 2024-07-29 VITALS — Resp 18 | Ht 68.0 in | Wt 310.0 lb

## 2024-07-29 DIAGNOSIS — I890 Lymphedema, not elsewhere classified: Secondary | ICD-10-CM | POA: Diagnosis not present

## 2024-07-29 NOTE — Telephone Encounter (Signed)
 Patient called and stated Home Health did not come out and change his unna wraps Friday 07/26/2024. I contacted Lili with [facility] and she stated patient has [employer] and they are approving his visits one at a time at this time and they are still awaiting approval from his insurance in order to send her nurses out to change his unna wraps. She stated to bring him in and have his wraps changed in office this week and she would notify us  if he is approved for home health to change his wraps next week.

## 2024-07-29 NOTE — Progress Notes (Unsigned)
 History of Present Illness  There is no documented history at this time  Assessments & Plan   There are no diagnoses linked to this encounter.    Additional instructions  Subjective:  Patient presents with venous ulcer of the Bilateral lower extremity.    Procedure:  3 layer unna wrap was placed Bilateral lower extremity.   Plan:   Follow up in one week.

## 2024-07-29 NOTE — Telephone Encounter (Signed)
 Contacted Kailey with [facility] at this time to confirm they had [employer] listed for patients insurance instead of [employer] and they did, but will continue homehealth services for unna wraps and nurse will be out to see patient next week Monday or Tuesday, but visit still requires a pre-authorization each week per patients insurance.

## 2024-07-30 ENCOUNTER — Encounter (INDEPENDENT_AMBULATORY_CARE_PROVIDER_SITE_OTHER): Payer: Self-pay | Admitting: Nurse Practitioner

## 2024-08-01 ENCOUNTER — Ambulatory Visit (INDEPENDENT_AMBULATORY_CARE_PROVIDER_SITE_OTHER): Admitting: Vascular Surgery

## 2024-08-04 DIAGNOSIS — R0902 Hypoxemia: Secondary | ICD-10-CM | POA: Diagnosis not present

## 2024-08-09 ENCOUNTER — Telehealth (INDEPENDENT_AMBULATORY_CARE_PROVIDER_SITE_OTHER): Payer: Self-pay

## 2024-08-09 NOTE — Telephone Encounter (Signed)
 Marval return call stating that patient insurance authorization is pending and this is the cause of the delay of nurse visit. The patient will be discharge on today but a new order has been placed to restart services. The home health nurse will be going out to the patient home on Tuesday to restart nursing visits for weekly unna wraps. Patient has been notified with recommendations.

## 2024-08-09 NOTE — Telephone Encounter (Signed)
 Patient reach out to the office stating that his home health nurse has not be at the home this week for the scheduled unna wraps. I did reach out to [place] checking on the status of when the nurse will be going out to the home. At this time I waiting on the return call with response. I have left a message on the patient voicemail that waiting on the response with [facility].

## 2024-08-12 ENCOUNTER — Other Ambulatory Visit: Payer: Self-pay | Admitting: Internal Medicine

## 2024-08-12 ENCOUNTER — Ambulatory Visit
Admission: RE | Admit: 2024-08-12 | Discharge: 2024-08-12 | Disposition: A | Discharge status: Home / Self Care | Source: Ambulatory Visit | Attending: Internal Medicine | Admitting: Internal Medicine

## 2024-08-12 DIAGNOSIS — R2232 Localized swelling, mass and lump, left upper limb: Secondary | ICD-10-CM | POA: Diagnosis present

## 2024-08-14 ENCOUNTER — Telehealth (INDEPENDENT_AMBULATORY_CARE_PROVIDER_SITE_OTHER): Payer: Self-pay

## 2024-08-14 NOTE — Telephone Encounter (Signed)
 Patient called at this time to inform the office that home health did not call or show up this week to wrap his legs(unna boots). Patient stated he went to a doctors appointment yesterday and the nurse in the office wrapped his legs and she will wrap his legs again Monday in office, patients PCP. Advised patient to contact home health at this time as well.

## 2024-08-23 ENCOUNTER — Telehealth (INDEPENDENT_AMBULATORY_CARE_PROVIDER_SITE_OTHER): Payer: Self-pay

## 2024-08-23 NOTE — Telephone Encounter (Signed)
 Danny Nurse from [facility] called into nurse line to report that she wanted to request verbal alternate orders for patient wound care until unna available. Cleanse with soap and water apply calcium  aginate and cover with gauze and tape. Call back to [facility] @ [phone] to give ok and voicemail was full. Call to main office [phone] spoke to debbie she reports she will pass along the message
